# Patient Record
Sex: Female | Born: 1950 | Race: White | Hispanic: No | Smoking: Current every day smoker
Health system: Southern US, Community
[De-identification: ages and names within clinical notes are randomized; demographics above are authoritative.]

## PROBLEM LIST (undated history)

## (undated) DIAGNOSIS — K219 Gastro-esophageal reflux disease without esophagitis: Secondary | ICD-10-CM

## (undated) DIAGNOSIS — N183 Chronic kidney disease, stage 3 unspecified: Secondary | ICD-10-CM

## (undated) DIAGNOSIS — I739 Peripheral vascular disease, unspecified: Secondary | ICD-10-CM

## (undated) DIAGNOSIS — B070 Plantar wart: Secondary | ICD-10-CM

## (undated) DIAGNOSIS — R112 Nausea with vomiting, unspecified: Secondary | ICD-10-CM

## (undated) DIAGNOSIS — I714 Abdominal aortic aneurysm, without rupture: Secondary | ICD-10-CM

## (undated) DIAGNOSIS — Z9889 Other specified postprocedural states: Secondary | ICD-10-CM

## (undated) DIAGNOSIS — R0600 Dyspnea, unspecified: Secondary | ICD-10-CM

## (undated) DIAGNOSIS — E119 Type 2 diabetes mellitus without complications: Secondary | ICD-10-CM

## (undated) DIAGNOSIS — I10 Essential (primary) hypertension: Secondary | ICD-10-CM

## (undated) DIAGNOSIS — F32A Depression, unspecified: Secondary | ICD-10-CM

## (undated) DIAGNOSIS — E785 Hyperlipidemia, unspecified: Secondary | ICD-10-CM

## (undated) DIAGNOSIS — F17210 Nicotine dependence, cigarettes, uncomplicated: Secondary | ICD-10-CM

## (undated) DIAGNOSIS — N390 Urinary tract infection, site not specified: Secondary | ICD-10-CM

## (undated) DIAGNOSIS — D649 Anemia, unspecified: Secondary | ICD-10-CM

## (undated) DIAGNOSIS — R3129 Other microscopic hematuria: Secondary | ICD-10-CM

## (undated) HISTORY — PX: EYE SURGERY: SHX253

## (undated) HISTORY — DX: Type 2 diabetes mellitus without complications: E11.9

## (undated) HISTORY — DX: Nicotine dependence, cigarettes, uncomplicated: F17.210

## (undated) HISTORY — PX: COLONOSCOPY W/ POLYPECTOMY: SHX1380

## (undated) HISTORY — DX: Plantar wart: B07.0

## (undated) HISTORY — DX: Chronic kidney disease, stage 3 unspecified: N18.30

## (undated) HISTORY — DX: Other microscopic hematuria: R31.29

## (undated) HISTORY — DX: Essential (primary) hypertension: I10

## (undated) HISTORY — DX: Hyperlipidemia, unspecified: E78.5

## (undated) HISTORY — PX: TUBAL LIGATION: SHX77

---
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
## 2019-04-01 ENCOUNTER — Encounter: Payer: Self-pay | Admitting: Vascular Surgery

## 2019-04-01 ENCOUNTER — Other Ambulatory Visit: Payer: Self-pay

## 2019-04-01 ENCOUNTER — Ambulatory Visit (INDEPENDENT_AMBULATORY_CARE_PROVIDER_SITE_OTHER): Payer: Medicare Other | Admitting: Vascular Surgery

## 2019-04-01 VITALS — BP 189/104 | HR 87 | Temp 98.2°F | Resp 16 | Ht 63.0 in | Wt 152.0 lb

## 2019-04-01 DIAGNOSIS — I714 Abdominal aortic aneurysm, without rupture, unspecified: Secondary | ICD-10-CM

## 2019-04-01 NOTE — Progress Notes (Signed)
REASON FOR CONSULT:    Abdominal aortic aneurysm.  The consult is requested by Dr. Gala Lewandowsky.   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  These involve the pararenal aorta also.  The maximum diameter of the aorta including one of the larger saccular aneurysms is 4.9 cm.  Regardless given the saccular nature of these aneurysms I think we need to follow this closely and I have ordered a CT of the chest abdomen and pelvis with 1 mm cuts in 3 months.  If these appear to be changing at all or she develops any symptoms I think she should be considered for a fenestrated repair and I will be able to review these films with Dr. Trula Slade.  If these enlarged or became symptomatic and she required repair, if she were not a candidate for fenestrated graft, she would likely require a thoracoabdominal approach for repair and would have to be referred to [facility].  HYPERTENSION: The patient's initial blood pressure today was elevated. We repeated this and this was still elevated. We have encouraged the patient to follow up with their primary care physician for management of their blood pressure.  I have discussed with her the importance of tobacco cessation in detail. (> 3 min).  She understands that continued tobacco use and poor blood pressure control do increase her risk of aneurysm expansion and rupture.  Deitra Mayo, MD, FACS [facility] [phone] Office: [phone]   HPI:   Kathryn Hancock is a pleasant 68 y.o. female, who was having difficulty urinating which prompted a CT urogram.  This showed cholelithiasis but no evidence of ureterolithiasis.  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  The maximum diameter was 4.9 cm.  She denies abdominal pain or back pain.  There is no  family history of aneurysmal disease.    History reviewed. No pertinent past medical history.  History reviewed. No pertinent family history.  SOCIAL HISTORY: Social History   Socioeconomic History  . Marital status: Widowed    Spouse name: Not on file  . Number of children: Not on file  . Years of education: Not on file  . Highest education level: Not on file  Occupational History  . Not on file  Social Needs  . Financial resource strain: Not on file  . Food insecurity    Worry: Not on file    Inability: Not on file  . Transportation needs    Medical: Not on file    Non-medical: Not on file  Tobacco Use  . Smoking status: Current Every Day Smoker    Packs/day: 2.00    Types: Cigarettes  . Smokeless tobacco: Never Used  Substance and Sexual Activity  . Alcohol use: Not Currently  . Drug use: Not on file  . Sexual activity: Not on file  Lifestyle  . Physical activity    Days per week: Not on file    Minutes per session: Not on file  . Stress: Not on file  Relationships  . Social Herbalist on phone: Not on file    Gets together: Not on file    Attends religious service: Not on file    Active member of club or organization: Not on file    Attends meetings of clubs or organizations: Not on  file    Relationship status: Not on file  . Intimate partner violence    Fear of current or ex partner: Not on file    Emotionally abused: Not on file    Physically abused: Not on file    Forced sexual activity: Not on file  Other Topics Concern  . Not on file  Social History Narrative  . Not on file    Allergies  Allergen Reactions  . Penicillins     Current Outpatient Medications  Medication Sig Dispense Refill  . metFORMIN (GLUCOPHAGE) 500 MG tablet Take by mouth 2 (two) times daily with a meal.     No current facility-administered medications for this visit.     REVIEW OF SYSTEMS:  [X]  denotes positive finding, [ ]  denotes negative finding Cardiac   Comments:  Chest pain or chest pressure:    Shortness of breath upon exertion:    Short of breath when lying flat:    Irregular heart rhythm:        Vascular    Pain in calf, thigh, or hip brought on by ambulation:    Pain in feet at night that wakes you up from your sleep:     Blood clot in your veins:    Leg swelling:         Pulmonary    Oxygen at home:    Productive cough:     Wheezing:         Neurologic    Sudden weakness in arms or legs:     Sudden numbness in arms or legs:     Sudden onset of difficulty speaking or slurred speech:    Temporary loss of vision in one eye:     Problems with dizziness:         Gastrointestinal    Blood in stool:     Vomited blood:         Genitourinary    Burning when urinating:     Blood in urine:        Psychiatric    Major depression:         Hematologic    Bleeding problems:    Problems with blood clotting too easily:        Skin    Rashes or ulcers:        Constitutional    Fever or chills:     PHYSICAL EXAM:   Vitals:   04/01/19 1247  BP: (!) 189/104  Pulse: 87  Resp: 16  Temp: 98.2 F (36.8 C)  TempSrc: Oral  SpO2: 96%  Weight: 152 lb (68.9 kg)  Height: 5\' 3"  (1.6 m)    GENERAL: The patient is a well-nourished female, in no acute distress. The vital signs are documented above. CARDIAC: There is a regular rate and rhythm.  VASCULAR: I do not detect carotid bruits. She has palpable femoral pulses. I cannot palpate pedal pulses. She has no significant lower extremity swelling. PULMONARY: There is good air exchange bilaterally without wheezing or rales. ABDOMEN: Soft and non-tender with normal pitched bowel sounds.  Her aneurysm is nontender. MUSCULOSKELETAL: There are no major deformities or cyanosis. NEUROLOGIC: No focal weakness or paresthesias are detected. SKIN: There are no ulcers or rashes noted. PSYCHIATRIC: The patient has a normal affect.  DATA:    CT UROGRAM: I was able to retrieve her CT  urogram that was done at [facility].  This shows a moderate sized fusiform aneurysm with the largest diameter of 4.9  cm.  There are multiple saccular components to this including 2 that are pararenal.

## 2019-06-10 ENCOUNTER — Other Ambulatory Visit: Payer: Self-pay

## 2019-06-10 DIAGNOSIS — I714 Abdominal aortic aneurysm, without rupture, unspecified: Secondary | ICD-10-CM

## 2019-06-12 DIAGNOSIS — I714 Abdominal aortic aneurysm, without rupture, unspecified: Secondary | ICD-10-CM

## 2019-06-12 DIAGNOSIS — I2510 Atherosclerotic heart disease of native coronary artery without angina pectoris: Secondary | ICD-10-CM

## 2019-06-12 DIAGNOSIS — D151 Benign neoplasm of heart: Secondary | ICD-10-CM

## 2019-06-12 HISTORY — DX: Atherosclerotic heart disease of native coronary artery without angina pectoris: I25.10

## 2019-06-12 HISTORY — DX: Abdominal aortic aneurysm, without rupture: I71.4

## 2019-06-12 HISTORY — DX: Abdominal aortic aneurysm, without rupture, unspecified: I71.40

## 2019-06-12 HISTORY — DX: Benign neoplasm of heart: D15.1

## 2019-06-12 HISTORY — PX: OTHER SURGICAL HISTORY: SHX169

## 2019-06-26 ENCOUNTER — Other Ambulatory Visit: Payer: Self-pay

## 2019-06-26 ENCOUNTER — Ambulatory Visit
Admission: RE | Admit: 2019-06-26 | Discharge: 2019-06-26 | Disposition: A | Discharge status: Home / Self Care | Payer: Medicare Other | Source: Ambulatory Visit | Attending: Vascular Surgery | Admitting: Vascular Surgery

## 2019-06-26 DIAGNOSIS — I714 Abdominal aortic aneurysm, without rupture, unspecified: Secondary | ICD-10-CM

## 2019-06-26 MED ORDER — IOPAMIDOL (ISOVUE-370) INJECTION 76%
75.0000 mL | Freq: Once | INTRAVENOUS | Status: AC | PRN
  Administered 2019-06-26: 60 mL via INTRAVENOUS

## 2019-06-30 ENCOUNTER — Telehealth (HOSPITAL_COMMUNITY): Payer: Self-pay

## 2019-06-30 NOTE — Telephone Encounter (Signed)

## 2019-07-01 ENCOUNTER — Ambulatory Visit (INDEPENDENT_AMBULATORY_CARE_PROVIDER_SITE_OTHER): Payer: Medicare Other | Admitting: Vascular Surgery

## 2019-07-01 ENCOUNTER — Other Ambulatory Visit: Payer: Self-pay

## 2019-07-01 ENCOUNTER — Encounter: Payer: Self-pay | Admitting: Vascular Surgery

## 2019-07-01 VITALS — BP 148/78 | HR 85 | Temp 97.3°F | Resp 20 | Ht 63.0 in | Wt 146.0 lb

## 2019-07-01 DIAGNOSIS — I714 Abdominal aortic aneurysm, without rupture, unspecified: Secondary | ICD-10-CM

## 2019-07-01 DIAGNOSIS — I5189 Other ill-defined heart diseases: Secondary | ICD-10-CM

## 2019-07-01 NOTE — Progress Notes (Signed)
Patient name: Kathryn Hancock MRN: [id number] DOB: 1951/03/06 Sex: female  REASON FOR VISIT:   Follow-up of abdominal aortic aneurysm.  HPI:   Kathryn Hancock is a pleasant 69 y.o. female who I saw on 04/01/2019.  She was having difficulty urinating which prompted a CT urogram.  An incidental finding was a saccular aneurysm of the infrarenal aorta which measured 4.9 cm in maximum diameter.  On the CT urogram there was a moderate sized fusiform aneurysm of the infrarenal aorta measuring 4.9 cm in maximum diameter.  There were multiple saccular components to this including 2 that were pararenal.  I ordered a CT of the chest abdomen pelvis with thin cuts in 3 months and she comes in to discuss these results.  I felt that if these progressed or she develops symptoms she would have to be considered for a fenestrated repair or referred to [facility] for a thoracoabdominal repair.  We did discuss the importance of tobacco cessation at that visit.  Since I saw the patient last she denies any new onset abdominal pain or back pain.  She is scheduled for a colonoscopy as part of her routine screening as she has a history of polyps.  She does describe some mild bilateral calf claudication.  I do not get any history of rest pain or nonhealing ulcers.  She does continue to smoke.  Past Medical History:  Diagnosis Date  . AAA (abdominal aortic aneurysm) ([place])   . Acute urinary tract infection   . Diabetes mellitus without complication (HCC)    Type 2  . Hyperlipidemia    Mixed  . Hypertension   . Microscopic hematuria   . Plantar wart of right foot     No family history on file.  SOCIAL HISTORY: Social History   Tobacco Use  . Smoking status: Current Every Day Smoker    Packs/day: 2.00    Types: Cigarettes  . Smokeless tobacco: Never Used  Substance Use Topics  . Alcohol use: Not Currently    Allergies  Allergen Reactions  . Penicillins     Current Outpatient Medications  Medication Sig  Dispense Refill  . Ascorbic Acid (VITAMIN C) 1000 MG tablet Take 1,000 mg by mouth daily.    Marland Kitchen aspirin EC 81 MG tablet Take 81 mg by mouth daily.    Marland Kitchen atorvastatin (LIPITOR) 20 MG tablet Take 20 mg by mouth daily.    . Cholecalciferol (D3-1000) 25 MCG (1000 UT) tablet Take 1,000 Units by mouth daily.    Marland Kitchen dicyclomine (BENTYL) 10 MG capsule Take 10 mg by mouth 4 (four) times daily -  before meals and at bedtime.    . empagliflozin (JARDIANCE) 25 MG TABS tablet Take 25 mg by mouth daily.    . Exenatide ER (BYDUREON BCISE) 2 MG/0.85ML AUIJ Inject into the skin. Inject 2 mg every week with meals.    . imiquimod (ALDARA) 5 % cream Apply topically 3 (three) times a week.    . losartan (COZAAR) 100 MG tablet Take 100 mg by mouth daily.    . metFORMIN (GLUCOPHAGE) 1000 MG tablet Take 1,000 mg by mouth 2 (two) times daily with a meal.    . omeprazole (PRILOSEC) 20 MG capsule Take 20 mg by mouth daily.     No current facility-administered medications for this visit.    REVIEW OF SYSTEMS:  [X]  denotes positive finding, [ ]  denotes negative finding Cardiac  Comments:  Chest pain or chest pressure:    Shortness of  breath upon exertion:    Short of breath when lying flat:    Irregular heart rhythm:        Vascular    Pain in calf, thigh, or hip brought on by ambulation:    Pain in feet at night that wakes you up from your sleep:     Blood clot in your veins:    Leg swelling:         Pulmonary    Oxygen at home:    Productive cough:     Wheezing:         Neurologic    Sudden weakness in arms or legs:     Sudden numbness in arms or legs:     Sudden onset of difficulty speaking or slurred speech:    Temporary loss of vision in one eye:     Problems with dizziness:         Gastrointestinal    Blood in stool:     Vomited blood:         Genitourinary    Burning when urinating:     Blood in urine:        Psychiatric    Major depression:         Hematologic    Bleeding problems:      Problems with blood clotting too easily:        Skin    Rashes or ulcers:        Constitutional    Fever or chills:     PHYSICAL EXAM:   Vitals:   07/01/19 1249  BP: (!) 148/78  Pulse: 85  Resp: 20  Temp: (!) 97.3 F (36.3 C)  SpO2: 99%  Weight: 146 lb (66.2 kg)  Height: 5\' 3"  (1.6 m)    GENERAL: The patient is a well-nourished female, in no acute distress. The vital signs are documented above. CARDIAC: There is a regular rate and rhythm.  VASCULAR: I do not detect carotid bruits. She has normal femoral pulses. I do not palpate pedal pulses.  Both feet however are warm and well-perfused. She has no significant lower extremity swelling. PULMONARY: There is good air exchange bilaterally without wheezing or rales. ABDOMEN: Soft and non-tender with normal pitched bowel sounds.  Her aneurysm is nontender. MUSCULOSKELETAL: There are no major deformities or cyanosis. NEUROLOGIC: No focal weakness or paresthesias are detected. SKIN: There are no ulcers or rashes noted. PSYCHIATRIC: The patient has a normal affect.  DATA:    CT CHEST ABDOMEN PELVIS: I have reviewed the images of the CT of the chest abdomen pelvis that was done on 06/26/2019.  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  The aneurysms are unchanged in size.  Of note there was a large filling defect of the left atrium.  This was unchanged from previous CT scan and was felt to potentially be related to previous procedures involving the left atrium versus thrombus or mass.  MEDICAL ISSUES:   PARARENAL ABDOMINAL AORTIC ANEURYSM: This patient has an unusual pararenal abdominal aortic aneurysm.  The maximum diameter is 4.9 cm distally.  There is some saccular components to this aneurysm at the level of the renals and therefore if this enlarge significantly she would have to be considered for a fenestrated graft versus a thoracoabdominal repair.  I explained  that if she did require thoracoabdominal repair we would refer her to [facility].  I have explained that if she  would prefer to have this followed there that would be perfectly reasonable.  However she feels comfortable having [place] follow it and only considering referral if this enlarges significantly.  I have ordered a CT angiogram of the abdomen and pelvis in 9 months and I will see her back at that time.  We have again discussed the importance of tobacco cessation.  LEFT ATRIAL FILLING DEFECT: Her CT scan shows a large left atrial filling defect and we will schedule a 2D echo to further evaluate this.  She will likely need cardiac referral pending these results.  She would like to have this done in [place] closer to [place].  CLAUDICATION: The patient does describe bilateral lower extremity claudication.  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  I have ordered ABIs when she returns in 9 months to have as a baseline.    Deitra Mayo [facility] [phone]

## 2019-07-02 ENCOUNTER — Other Ambulatory Visit: Payer: Self-pay

## 2019-07-02 DIAGNOSIS — I5189 Other ill-defined heart diseases: Secondary | ICD-10-CM

## 2019-07-02 DIAGNOSIS — I714 Abdominal aortic aneurysm, without rupture, unspecified: Secondary | ICD-10-CM

## 2019-07-09 ENCOUNTER — Other Ambulatory Visit (HOSPITAL_COMMUNITY): Payer: Medicare Other

## 2019-07-13 HISTORY — PX: TRANSTHORACIC ECHOCARDIOGRAM: SHX275

## 2019-07-14 ENCOUNTER — Ambulatory Visit (HOSPITAL_COMMUNITY): Admission: RE | Admit: 2019-07-14 | Payer: Medicare Other | Source: Ambulatory Visit

## 2019-07-24 ENCOUNTER — Ambulatory Visit (HOSPITAL_COMMUNITY)
Admission: RE | Admit: 2019-07-24 | Discharge: 2019-07-24 | Disposition: A | Discharge status: Home / Self Care | Payer: Medicare Other | Source: Ambulatory Visit | Attending: Vascular Surgery | Admitting: Vascular Surgery

## 2019-07-24 ENCOUNTER — Other Ambulatory Visit: Payer: Self-pay

## 2019-07-24 DIAGNOSIS — I358 Other nonrheumatic aortic valve disorders: Secondary | ICD-10-CM | POA: Insufficient documentation

## 2019-07-24 DIAGNOSIS — F17200 Nicotine dependence, unspecified, uncomplicated: Secondary | ICD-10-CM | POA: Insufficient documentation

## 2019-07-24 DIAGNOSIS — E119 Type 2 diabetes mellitus without complications: Secondary | ICD-10-CM | POA: Insufficient documentation

## 2019-07-24 DIAGNOSIS — I714 Abdominal aortic aneurysm, without rupture, unspecified: Secondary | ICD-10-CM

## 2019-07-24 DIAGNOSIS — E785 Hyperlipidemia, unspecified: Secondary | ICD-10-CM | POA: Insufficient documentation

## 2019-07-24 DIAGNOSIS — I5189 Other ill-defined heart diseases: Secondary | ICD-10-CM | POA: Diagnosis not present

## 2019-07-24 DIAGNOSIS — I119 Hypertensive heart disease without heart failure: Secondary | ICD-10-CM | POA: Insufficient documentation

## 2019-07-24 NOTE — Progress Notes (Signed)
  Echocardiogram 2D Echocardiogram has been performed.  Kathryn Hancock 07/24/2019, 11:50 AM

## 2019-12-07 ENCOUNTER — Telehealth: Payer: Self-pay | Admitting: Cardiology

## 2019-12-07 NOTE — Telephone Encounter (Signed)
Returned call to patient, advised ok for daughter to attend.

## 2019-12-07 NOTE — Telephone Encounter (Signed)
New Message  Pt is calling and says she gets confused a lot and she would like for her daughter to assist her to her appt  She says they are both fully vaccinated   Please advise

## 2019-12-10 ENCOUNTER — Ambulatory Visit (INDEPENDENT_AMBULATORY_CARE_PROVIDER_SITE_OTHER): Payer: Medicare Other | Admitting: Cardiology

## 2019-12-10 ENCOUNTER — Encounter: Payer: Self-pay | Admitting: Cardiology

## 2019-12-10 ENCOUNTER — Other Ambulatory Visit: Payer: Self-pay

## 2019-12-10 VITALS — BP 134/82 | HR 85 | Temp 97.2°F | Ht 64.0 in | Wt 147.8 lb

## 2019-12-10 DIAGNOSIS — D151 Benign neoplasm of heart: Secondary | ICD-10-CM | POA: Insufficient documentation

## 2019-12-10 DIAGNOSIS — I5189 Other ill-defined heart diseases: Secondary | ICD-10-CM

## 2019-12-10 DIAGNOSIS — I714 Abdominal aortic aneurysm, without rupture, unspecified: Secondary | ICD-10-CM

## 2019-12-10 DIAGNOSIS — I2510 Atherosclerotic heart disease of native coronary artery without angina pectoris: Secondary | ICD-10-CM | POA: Diagnosis not present

## 2019-12-10 DIAGNOSIS — Z720 Tobacco use: Secondary | ICD-10-CM

## 2019-12-10 DIAGNOSIS — I25119 Atherosclerotic heart disease of native coronary artery with unspecified angina pectoris: Secondary | ICD-10-CM | POA: Insufficient documentation

## 2019-12-10 MED ORDER — APIXABAN 5 MG PO TABS: 5.0000 mg | ORAL_TABLET | Freq: Two times a day (BID) | ORAL | 6 refills | Status: AC

## 2019-12-10 MED ORDER — APIXABAN 5 MG PO TABS: 5.0000 mg | ORAL_TABLET | Freq: Two times a day (BID) | ORAL | 1 refills | Status: DC

## 2019-12-10 NOTE — Progress Notes (Signed)
Primary Care Provider: Gala Lewandowsky, MD Cardiologist: Glenetta Hew, MD Electrophysiologist: None  Vascular Sgx: Dr. Scot Dock  Clinic Note: Chief Complaint  Patient presents with  . New Patient (Initial Visit)    Left atrial mass    HPI:    Kathryn Hancock is a 69 y.o. female with no prior cardiac history with long-term smoking, hyperlipidemia, hypertension and diabetes with documented abdominal aortic aneurysm below who presents today for LEFT ATRIAL MASS seen on CT scan and echocardiogram, at the request of Sistasis, Sylvester Harder, MD.  Kathryn Hancock was last seen on May 6 by her PCP in follow-up from recent studies.  Recent Hospitalizations: None  Reviewed  CV studies:    The following studies were reviewed today: (if available, images/films reviewed: From Epic Chart or Care Everywh she was 60 ere) . July 24, 2019, 2D ECHO:: EF 60 to 65%.  GR 1 DD.  Elevated LVEDP.  Large size ill-defined echodensity in the left atrium suspicious for thrombus versus tumor.  Moderately dilated LA. . 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  Distal abdominal aortic aneurysm 4.5 x 4.7 cm.  Greatest AP dimension of the infrarenal aorta is 4.9.  No evidence of thoracic aortic aneurysm or dissection.  Brief segment of proximal IMA occlusion.  Large geographic filling defect of the left atrium with associated dense radiopaque material.  Aortic atherosclerosis and emphysema.  Atherosclerotic coronary disease.  Interval History:   Kathryn Hancock is here really without any major cardiac symptoms.  She is here because of findings on her CT scan and echo.  She says that she may notice occasional palpitations here and there but nothing significant.  About 2 to 3 years ago she had a long episode of chest pain last about an hour but otherwise has not had any chest pain or pressure.  She has baseline exertional dyspnea but attributes that to her smoking.  Has morning  cough.  No other heart failure symptoms.  No edema.  No TIA or amaurosis fugax symptoms.  CV Review of Symptoms (Summary) Cardiovascular ROS: no chest pain or dyspnea on exertion positive for - palpitations and shortness of breath negative for - edema, irregular heartbeat, orthopnea, paroxysmal nocturnal dyspnea, rapid heart rate or Syncope/near syncope, TIA/amaurosis fugax, claudication  The patient does not have symptoms concerning for COVID-19 infection (fever, chills, cough, or new shortness of breath).  The patient is practicing social distancing & Masking.    REVIEWED OF SYSTEMS   Review of Systems  Constitutional: Negative for malaise/fatigue and weight loss.  HENT: Positive for congestion (Occasionally in the morning). Negative for nosebleeds.   Respiratory: Positive for cough (Morning cough) and shortness of breath (Baseline). Negative for sputum production.   Gastrointestinal: Negative for blood in stool and melena.  Genitourinary: Negative for hematuria.  Musculoskeletal: Negative for joint pain.  Neurological: Negative for dizziness and focal weakness.  Psychiatric/Behavioral: Positive for depression. Negative for memory loss and suicidal ideas. The patient is nervous/anxious (Has had longstanding issues with intermittent anxiety). The patient does not have insomnia.        Still grieving her son's death.  He had an overdose in 10-Oct-2022 and died of complications.  This is probably reason why she missed her initial cardiology appointment.     I have reviewed and (if needed) personally updated the patient's problem list, medications, allergies, past medical and surgical history, social and family history.   PAST MEDICAL HISTORY   Past Medical History:  Diagnosis  Date  . 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  Distal abdominal aortic aneurysm 4.5 x 4.7 cm.  Greatest AP dimension of the infrarenal  aorta is 4.9.  No evidence of thoracic aortic aneurysm or dissection.  Brief segment of proximal IMA occlusion.  . CKD (chronic kidney disease) stage 3a, GFR 30-59 ml/min   . Coronary artery calcification seen on CAT scan 06/2019  . Diabetes mellitus without complication (HCC)    Type 2  . Heavy smoker (more than 20 cigarettes per day)    ~ 2 ppd; since age 45 (66 pk yr)  . Hyperlipidemia    Mixed  . Hypertension   . 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 -> TTE 07/24/19: Large size ill-defined echodensity in the left atrium suspicious for thrombus versus tumor.  Moderately dilated LA.  . Microscopic hematuria   . Plantar wart of right foot     PAST SURGICAL HISTORY   Past Surgical History:  Procedure Laterality Date  . COLONOSCOPY W/ POLYPECTOMY    . EYE SURGERY Bilateral    Cataract surgery  . TRANSTHORACIC ECHOCARDIOGRAM  07/2019    EF 60 to 65%.  GR 1 DD.  Elevated LVEDP.  Large size ill-defined echodensity in the left atrium suspicious for thrombus versus tumor.  Moderately dilated LA.  . TUBAL LIGATION      MEDICATIONS/ALLERGIES   Current Meds  Medication Sig  . Ascorbic Acid (VITAMIN C) 1000 MG tablet Take 1,000 mg by mouth daily.  Marland Kitchen atorvastatin (LIPITOR) 20 MG tablet Take 20 mg by mouth daily.  . Cholecalciferol (D3-1000) 25 MCG (1000 UT) tablet Take 1,000 Units by mouth daily.  Marland Kitchen dicyclomine (BENTYL) 10 MG capsule Take 10 mg by mouth 4 (four) times daily -  before meals and at bedtime.  . empagliflozin (JARDIANCE) 25 MG TABS tablet Take 25 mg by mouth daily.  . Exenatide ER (BYDUREON BCISE) 2 MG/0.85ML AUIJ Inject into the skin. Inject 2 mg every week with meals.  Marland Kitchen glucose blood test strip 1 each by Other route as needed. Use as instructed  . imiquimod (ALDARA) 5 % cream Apply topically 3 (three) times a week.  . losartan (COZAAR) 100 MG tablet Take 100 mg by mouth  daily.  . metFORMIN (GLUCOPHAGE) 1000 MG tablet Take 1,000 mg by mouth 2 (two) times daily with a meal.  . montelukast (SINGULAIR) 10 MG tablet Take 10 mg by mouth at bedtime.  Marland Kitchen omeprazole (PRILOSEC) 20 MG capsule Take 20 mg by mouth daily.  . [DISCONTINUED] aspirin EC 81 MG tablet Take 81 mg by mouth daily.    Allergies  Allergen Reactions  . Penicillins     SOCIAL HISTORY/FAMILY HISTORY   Social History   Tobacco Use  . Smoking status: Current Every Day Smoker    Packs/day: 2.00    Years: 50.00    Pack years: 100.00    Types: Cigarettes  . Smokeless tobacco: Never Used  Vaping Use  . Vaping Use: Never used  Substance Use Topics  . Alcohol use: Not Currently  . Drug use: Never   Social History   Social History Narrative   She is a mother of 2-but her son died in 10/05/19 of a drug overdose.   Denies alcohol use.   Family History  Problem Relation Age of Onset  . Diabetes Mother   .  Diabetes Father   . Heart attack Father 7  . CAD Father   . Hyperlipidemia Father   . Hypertension Father     OBJCTIVE -PE, EKG, labs   Wt Readings from Last 3 Encounters:  12/10/19 147 lb 12.8 oz (67 kg)  07/01/19 146 lb (66.2 kg)  04/01/19 152 lb (68.9 kg)    Physical Exam: BP 134/82   Pulse 85   Temp (!) 97.2 F (36.2 C)   Ht 5\' 4"  (1.626 m)   Wt 147 lb 12.8 oz (67 kg)   SpO2 97%   BMI 25.37 kg/m  Physical Exam Constitutional:      General: She is not in acute distress.    Appearance: Normal appearance. She is normal weight. She is not ill-appearing.     Comments: Well-groomed.  Appears older than stated age.  HENT:     Head: Normocephalic and atraumatic.  Eyes:     Extraocular Movements: Extraocular movements intact.     Pupils: Pupils are equal, round, and reactive to light.  Cardiovascular:     Rate and Rhythm: Normal rate and regular rhythm.     Pulses: Normal pulses.     Heart sounds: Normal heart sounds. No murmur heard.  No friction rub. No gallop.       Comments: Nondisplaced PMI Pulmonary:     Effort: Pulmonary effort is normal.     Breath sounds: Normal breath sounds.     Comments: Slightly diminished breath sounds but no obvious wheezes rales or rhonchi Chest:     Chest wall: No tenderness.  Abdominal:     General: Abdomen is flat. Bowel sounds are normal. There is no distension.     Palpations: Abdomen is soft.     Tenderness: There is no abdominal tenderness.     Comments: No HSM  Musculoskeletal:        General: No swelling. Normal range of motion.  Neurological:     General: No focal deficit present.     Mental Status: She is alert and oriented to person, place, and time.  Psychiatric:        Mood and Affect: Mood normal.        Behavior: Behavior normal.        Thought Content: Thought content normal.        Judgment: Judgment normal.     Adult ECG Report  Rate: 85 ;  Rhythm: normal sinus rhythm and Normal axis, intervals and durations.;   Narrative Interpretation: Normal  Recent Labs:  10/14/2019  Na+ 146, K+ 4.8, Cl- 107, HCO3- 14 , BUN 19, Cr 1.38 (GFR ~40), Glu 117, Ca2+ 9.6; AST 18, ALT 16, AlkP 132  CBC: W 12.4, H/H 14.0/42.0%, Plt 244  No results found for: CHOL, HDL, LDLCALC, LDLDIRECT, TRIG, CHOLHDL No results found for: CREATININE, BUN, NA, K, CL, CO2 No results found for: TSH  ASSESSMENT/PLAN    Problem List Items Addressed This Visit    Tobacco abuse (Chronic)    Smoking cessation instruction/counseling given:  counseled patient on the dangers of tobacco use, advised patient to stop smoking, and reviewed strategies to maximize success      Coronary artery calcification seen on CAT scan (Chronic)    No active anginal symptoms.  She does have diagnoses of hypertension, hyperlipidemia and diabetes along with longstanding smoking. -> Prior to any likely cardiac surgery, would need preoperative cardiac catheterization to exclude exclusive disease.  She is already on aspirin statin and ARB. On  Jardiance.      Relevant Medications   apixaban (ELIQUIS) 5 MG TABS tablet   apixaban (ELIQUIS) 5 MG TABS tablet   Other Relevant Orders   EKG 12-Lead (Completed)   MR CARDIAC MORPHOLOGY W WO CONTRAST   Basic metabolic panel   Ambulatory referral to Cardiovascular Surgery   AAA (abdominal aortic aneurysm) without rupture (HCC) (Chronic)    First evaluated by Dr. Gae Gallop -> will likely follow-up with intermittent CTA chest abdomen pelvis evaluations.  Currently not at the level to do anything yet.  Continue blood pressure, lipid and diabetes management.  Smoking cessation counseling      Relevant Medications   apixaban (ELIQUIS) 5 MG TABS tablet   apixaban (ELIQUIS) 5 MG TABS tablet   Other Relevant Orders   EKG 12-Lead (Completed)   MR CARDIAC MORPHOLOGY W WO CONTRAST   Basic metabolic panel   Ambulatory referral to Cardiovascular Surgery   Left atrial mass - Primary    Very unusual finding.  I am not sure that I would consider this left atrial thrombus which was described in the echo.  Need to better more distinctly imaging.  I discussed this with Dr. Eleonore Chiquito reviewing both the CT scan and echocardiogram images.  He feels that dedicated cardiac MRI would be the most informative study in order to distinguish true features of this mass.  With the concern for thrombus, we will start her on Eliquis 5 mg twice daily.  However, I do not truthfully feel that it is a mass.  Plan:  Check cardiac MRI  Start Eliquis 5 mg twice daily with concern for initial description as thrombus but also for possible thromboembolic source with this left atrial mass.  Following MRI, will refer to CVTS -> suspected preop will need cardiac catheterization -> we will hold off on scheduling until seen by CVTS in order to determine if we need to do right and left or simply left heart cath.      Relevant Orders   EKG 12-Lead (Completed)   MR CARDIAC MORPHOLOGY W WO CONTRAST   Basic metabolic  panel   Ambulatory referral to Cardiovascular Surgery       COVID-19 Education: The signs and symptoms of COVID-19 were discussed with the patient and how to seek care for testing (follow up with PCP or arrange E-visit).   The importance of social distancing and COVID-19 vaccination was discussed today.  I spent a total of 22 minutes with the patient. >  50% of the time was spent in direct patient consultation.  Additional time spent with chart review  / charting (studies, outside notes, etc): 10 - plus 10 min discussion with Imaging Cardiologist to review Echo.  Total Time: 32 min   Current medicines are reviewed at length with the patient today.  (+/- concerns) n/a  Notice: This dictation was prepared with Dragon dictation along with smaller phrase technology. Any transcriptional errors that result from this process are unintentional and may not be corrected upon review.  Patient Instructions / Medication Changes & Studies & Tests Ordered   Patient Instructions  Medication Instructions:  Stop taking Aspirin   Start taking Eliquis 5 mg one tablet  Twice a day  *If you need a refill on your cardiac medications before your next appointment, please call your pharmacy*   Lab Work: [place] [facility] If you have labs (blood work) drawn today and your tests are completely normal, you will receive your  results only by: Marland Kitchen MyChart Message (if you have MyChart) OR . A paper copy in the mail If you have any lab test that is abnormal or we need to change your treatment, we will call you to review the results.   Testing/Procedures: Will be schedule at [place] has requested that you have a cardiac MRI. Cardiac MRI uses a computer to create images of your heart as its beating, producing both still and moving pictures of your heart and major blood vessels.  Please follow the instruction sheet given to you today for more  information.    Follow-Up: At [facility], you and your health needs are our priority.  As part of our continuing mission to provide you with exceptional heart care, we have created designated Provider Care Teams.  These Care Teams include your primary Cardiologist (physician) and Advanced Practice Providers (APPs -  Physician Assistants and Nurse Practitioners) who all work together to provide you with the care you need, when you need it.  We recommend signing up for the patient portal called "MyChart".  Sign up information is provided on this After Visit Summary.  MyChart is used to connect with patients for Virtual Visits (Telemedicine).  Patients are able to view lab/test results, encounter notes, upcoming appointments, etc.  Non-urgent messages can be sent to your provider as well.   To learn more about what you can do with MyChart, go to [email or web address].    Your next appointment:   2 month(s)  The format for your next appointment:   In Person  Provider:   Glenetta Hew, MD   Other Instructions   You have been referred to CVT' - surgeon - discuss Cardiac MRI- Atrial Mass?    Studies Ordered:   Orders Placed This Encounter  Procedures  . MR CARDIAC MORPHOLOGY W WO CONTRAST  . Basic metabolic panel  . Ambulatory referral to Cardiovascular Surgery  . EKG 12-Lead     Glenetta Hew, M.D., M.S. Interventional Cardiologist   Pager # [phone] Phone # [phone] [address] [place], [place] [address]   Thank you for choosing Heartcare at [facility]!!

## 2019-12-10 NOTE — Patient Instructions (Addendum)
Medication Instructions:  Stop taking Aspirin   Start taking Eliquis 5 mg one tablet  Twice a day  *If you need a refill on your cardiac medications before your next appointment, please call your pharmacy*   Lab Work: [place] [facility] If you have labs (blood work) drawn today and your tests are completely normal, you will receive your results only by: Marland Kitchen MyChart Message (if you have MyChart) OR . A paper copy in the mail If you have any lab test that is abnormal or we need to change your treatment, we will call you to review the results.   Testing/Procedures: Will be schedule at [place] has requested that you have a cardiac MRI. Cardiac MRI uses a computer to create images of your heart as its beating, producing both still and moving pictures of your heart and major blood vessels.  Please follow the instruction sheet given to you today for more information.    Follow-Up: At [facility], you and your health needs are our priority.  As part of our continuing mission to provide you with exceptional heart care, we have created designated Provider Care Teams.  These Care Teams include your primary Cardiologist (physician) and Advanced Practice Providers (APPs -  Physician Assistants and Nurse Practitioners) who all work together to provide you with the care you need, when you need it.  We recommend signing up for the patient portal called "MyChart".  Sign up information is provided on this After Visit Summary.  MyChart is used to connect with patients for Virtual Visits (Telemedicine).  Patients are able to view lab/test results, encounter notes, upcoming appointments, etc.  Non-urgent messages can be sent to your provider as well.   To learn more about what you can do with MyChart, go to [email or web address].    Your next appointment:   2 month(s)  The format for your next appointment:   In Person  Provider:   Glenetta Hew, MD   Other Instructions   You have been referred to CVT' - surgeon - discuss Cardiac MRI- Atrial Mass?

## 2019-12-13 ENCOUNTER — Encounter: Payer: Self-pay | Admitting: Cardiology

## 2019-12-13 NOTE — Assessment & Plan Note (Signed)
Very unusual finding.  I am not sure that I would consider this left atrial thrombus which was described in the echo.  Need to better more distinctly imaging.  I discussed this with Dr. Eleonore Chiquito reviewing both the CT scan and echocardiogram images.  He feels that dedicated cardiac MRI would be the most informative study in order to distinguish true features of this mass.  With the concern for thrombus, we will start her on Eliquis 5 mg twice daily.  However, I do not truthfully feel that it is a mass.  Plan:  Check cardiac MRI  Start Eliquis 5 mg twice daily with concern for initial description as thrombus but also for possible thromboembolic source with this left atrial mass.  Following MRI, will refer to CVTS -> suspected preop will need cardiac catheterization -> we will hold off on scheduling until seen by CVTS in order to determine if we need to do right and left or simply left heart cath.

## 2019-12-13 NOTE — Assessment & Plan Note (Signed)
First evaluated by Dr. Gae Gallop -> will likely follow-up with intermittent CTA chest abdomen pelvis evaluations.  Currently not at the level to do anything yet.  Continue blood pressure, lipid and diabetes management.  Smoking cessation counseling

## 2019-12-13 NOTE — Assessment & Plan Note (Signed)
Smoking cessation instruction/counseling given:  counseled patient on the dangers of tobacco use, advised patient to stop smoking, and reviewed strategies to maximize success 

## 2019-12-13 NOTE — Assessment & Plan Note (Signed)
No active anginal symptoms.  She does have diagnoses of hypertension, hyperlipidemia and diabetes along with longstanding smoking. -> Prior to any likely cardiac surgery, would need preoperative cardiac catheterization to exclude exclusive disease.  She is already on aspirin statin and ARB. On Jardiance.

## 2019-12-16 ENCOUNTER — Encounter: Payer: Self-pay | Admitting: *Deleted

## 2019-12-16 ENCOUNTER — Telehealth: Payer: Self-pay | Admitting: *Deleted

## 2019-12-16 NOTE — Telephone Encounter (Signed)
Spoke with patient's daughter (patient asked that I speak with her daughter) regarding appointment for Cardiac MRI scheduled Friday 01/22/20 at 8:00 am at [facility]----arrival time is 7:30 am 1st floor admissions office.  Patient is coming in 1 week prior for lab work. Will mail information to patient.

## 2020-01-15 LAB — BASIC METABOLIC PANEL
BUN/Creatinine Ratio: 12 (ref 12–28)
BUN: 15 mg/dL (ref 8–27)
CO2: 20 mmol/L (ref 20–29)
Calcium: 10.2 mg/dL (ref 8.7–10.3)
Chloride: 102 mmol/L (ref 96–106)
Creatinine, Ser: 1.26 mg/dL — ABNORMAL HIGH (ref 0.57–1.00)
GFR calc Af Amer: 51 mL/min/{1.73_m2} — ABNORMAL LOW (ref >59)
GFR calc non Af Amer: 44 mL/min/{1.73_m2} — ABNORMAL LOW (ref >59)
Glucose: 130 mg/dL — ABNORMAL HIGH (ref 65–99)
Potassium: 4.7 mmol/L (ref 3.5–5.2)
Sodium: 139 mmol/L (ref 134–144)

## 2020-01-20 ENCOUNTER — Telehealth (HOSPITAL_COMMUNITY): Payer: Self-pay | Admitting: Emergency Medicine

## 2020-01-20 NOTE — Telephone Encounter (Signed)
Reaching out to patient to offer assistance regarding upcoming cardiac imaging study; pt verbalizes understanding of appt date/time, parking situation and where to check in, pre-test NPO status and medications ordered, and verified current allergies; name and call back number provided for further questions should they arise Particia Strahm RN Navigator Cardiac Imaging [facility] [phone] office [phone] cell 

## 2020-01-22 ENCOUNTER — Other Ambulatory Visit: Payer: Self-pay

## 2020-01-22 ENCOUNTER — Ambulatory Visit (HOSPITAL_COMMUNITY)
Admission: RE | Admit: 2020-01-22 | Discharge: 2020-01-22 | Disposition: A | Discharge status: Home / Self Care | Payer: Medicare Other | Source: Ambulatory Visit | Attending: Cardiology | Admitting: Cardiology

## 2020-01-22 DIAGNOSIS — I5189 Other ill-defined heart diseases: Secondary | ICD-10-CM | POA: Insufficient documentation

## 2020-01-22 DIAGNOSIS — I714 Abdominal aortic aneurysm, without rupture, unspecified: Secondary | ICD-10-CM

## 2020-01-22 DIAGNOSIS — I2510 Atherosclerotic heart disease of native coronary artery without angina pectoris: Secondary | ICD-10-CM | POA: Diagnosis present

## 2020-01-22 HISTORY — PX: OTHER SURGICAL HISTORY: SHX169

## 2020-01-22 MED ORDER — GADOBUTROL 1 MMOL/ML IV SOLN
7.0000 mL | Freq: Once | INTRAVENOUS | Status: AC | PRN
  Administered 2020-01-22: 7 mL via INTRAVENOUS

## 2020-02-12 ENCOUNTER — Ambulatory Visit (INDEPENDENT_AMBULATORY_CARE_PROVIDER_SITE_OTHER): Payer: Medicare Other | Admitting: Cardiology

## 2020-02-12 ENCOUNTER — Encounter: Payer: Self-pay | Admitting: Cardiology

## 2020-02-12 ENCOUNTER — Other Ambulatory Visit: Payer: Self-pay

## 2020-02-12 VITALS — BP 160/92 | HR 91 | Ht 63.0 in | Wt 145.6 lb

## 2020-02-12 DIAGNOSIS — I2510 Atherosclerotic heart disease of native coronary artery without angina pectoris: Secondary | ICD-10-CM

## 2020-02-12 DIAGNOSIS — I714 Abdominal aortic aneurysm, without rupture, unspecified: Secondary | ICD-10-CM

## 2020-02-12 DIAGNOSIS — Z720 Tobacco use: Secondary | ICD-10-CM

## 2020-02-12 DIAGNOSIS — D151 Benign neoplasm of heart: Secondary | ICD-10-CM | POA: Diagnosis not present

## 2020-02-12 NOTE — Patient Instructions (Signed)
Medication Instructions:  Continue same medications *If you need a refill on your cardiac medications before your next appointment, please call your pharmacy*   Lab Work: None ordered   Testing/Procedures: None ordered   Follow-Up: At [facility], you and your health needs are our priority.  As part of our continuing mission to provide you with exceptional heart care, we have created designated Provider Care Teams.  These Care Teams include your primary Cardiologist (physician) and Advanced Practice Providers (APPs -  Physician Assistants and Nurse Practitioners) who all work together to provide you with the care you need, when you need it.  We recommend signing up for the patient portal called "MyChart".  Sign up information is provided on this After Visit Summary.  MyChart is used to connect with patients for Virtual Visits (Telemedicine).  Patients are able to view lab/test results, encounter notes, upcoming appointments, etc.  Non-urgent messages can be sent to your provider as well.   To learn more about what you can do with MyChart, go to [email or web address].    Your next appointment: 2 months     The format for your next appointment: Office     Provider: Dr.Harding  Dr.Owens office will call with appointment

## 2020-02-12 NOTE — Progress Notes (Signed)
Primary Care Provider: Gala Lewandowsky, MD Cardiologist: Glenetta Hew, MD Electrophysiologist: None  Vascular Sgx: Dr. Scot Dock  Clinic Note: Chief Complaint  Patient presents with  . Follow-up    Cardiac MRI results  . Tumor    Left atrial myxoma    HPI:    Kathryn Hancock is a 69 y.o. female-long-term smoker with hypertension, hyperlipidemia, and diabetes as well as documented AAA recently seen for evaluation of left atrial mass seen on CT scan echocardiogram at the request of Sistasis, Rowena, MD.  She presents now to discuss results of cardiac MRI.  Kathryn Hancock was seen for initial consultation on December 10, 2019 -> no real symptoms.  We ordered a cardiac MRI  Recent Hospitalizations: None  Reviewed  CV studies:    The following studies were reviewed today: (if available, images/films reviewed: From Epic Chart or Care Everywh she was 30 ere) . Cardiac MRI 01/22/2020: Normal LV size and function.  Moderate focal basal septal hypertrophy.  No S.A.M.  LVEF 61%.  RVEF 66%.  Large mobile mass in the left atrium attached to the interatrial septum-does not appear to thrombus characteristics consistent with left atrial myxoma.  Interval History:   Kathryn Hancock is here to discuss results of her cardiac MRI.  She still denies any cardiac symptoms besides occasional palpitations.  She has longstanding dyspnea and morning cough, which she relates to her long-term smoking.  Otherwise she denies any lightheadedness or dizziness, syncope/near syncope.  No chest pain or pressure with rest or exertion. No other heart failure symptoms.  No edema.  No TIA or amaurosis fugax symptoms.  CV Review of Symptoms (Summary) Cardiovascular ROS: no chest pain or dyspnea on exertion positive for - palpitations, shortness of breath and Significant anxiety with some palpitations. negative for - edema, irregular heartbeat, orthopnea, paroxysmal nocturnal dyspnea, rapid heart rate or Syncope/near syncope,  TIA/amaurosis fugax, claudication  The patient DOES NOT have symptoms concerning for COVID-19 infection (fever, chills, cough, or new shortness of breath).  The patient is practicing social distancing & Masking.    REVIEWED OF SYSTEMS   Review of Systems  Constitutional: Negative for malaise/fatigue and weight loss.  HENT: Positive for congestion (Occasionally in the morning). Negative for nosebleeds.   Respiratory: Positive for cough (Morning cough) and shortness of breath (Baseline-no worsening symptoms.). Negative for sputum production.   Gastrointestinal: Negative for blood in stool and melena.  Genitourinary: Negative for hematuria.  Musculoskeletal: Negative for joint pain.  Neurological: Negative for dizziness, focal weakness, loss of consciousness and headaches.  Psychiatric/Behavioral: Positive for depression. Negative for memory loss and suicidal ideas. The patient is nervous/anxious (Has had longstanding issues with intermittent anxiety). The patient does not have insomnia.        She is quite anxious, still grieving from her son's death.    I have reviewed and (if needed) personally updated the patient's problem list, medications, allergies, past medical and surgical history, social and family history.   PAST MEDICAL HISTORY   Past Medical History:  Diagnosis Date  . AAA (abdominal aortic aneurysm) ([place]) 06/2019   Multiple small pseudoaneurysmal projections of the dominant aorta.  Distal abdominal aortic aneurysm 4.5 x 4.7 cm.  Greatest AP dimension of the infrarenal aorta is 4.9.  No evidence of thoracic aortic aneurysm or dissection.  Brief segment of proximal IMA occlusion.  . CKD (chronic kidney disease) stage 3a, GFR 30-59 ml/min   . Coronary artery calcification seen on CAT scan 06/2019  . Diabetes mellitus  without complication (HCC)    Type 2  . Heavy smoker (more than 20 cigarettes per day)    ~ 2 ppd; since age 86 (53 pk yr)  . Hyperlipidemia    Mixed  .  Hypertension   . 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 -> TTE 07/24/19: Large size ill-defined echodensity in the left atrium suspicious for thrombus versus tumor.  Moderately dilated LA.--> CMR: Large mobile mass in the LA attached to the IAS-does not appear to be thrombus, characteristics consistent with LEFT ATRIAL MYXOMA  . Microscopic hematuria   . Plantar wart of right foot     PAST SURGICAL HISTORY   Past Surgical History:  Procedure Laterality Date  . Cardiac MRI  01/22/2020   Normal LV size and function.  Moderate focal basal septal hypertrophy.  No S.A.M.  LVEF 61%.  RVEF 66%.  Large mobile mass in the left atrium attached to the interatrial septum-does not appear to thrombus characteristics consistent with left atrial myxoma.  . Chest CTA  06/2019   Multiple small pseudoaneurysmal projections of the dominant aorta.  Distal abdominal aortic aneurysm 4.5 x 4.7 cm.  Greatest AP dimension of the infrarenal aorta is 4.9.  No evidence of thoracic aortic aneurysm or dissection.  Brief segment of proximal IMA occlusion.  Large geographic filling defect of the left atrium with associated dense radiopaque material.  Aortic atherosclerosis and emphysema  . COLONOSCOPY W/ POLYPECTOMY    . EYE SURGERY Bilateral    Cataract surgery  . TRANSTHORACIC ECHOCARDIOGRAM  07/2019    EF 60 to 65%.  GR 1 DD.  Elevated LVEDP.  Large size ill-defined echodensity in the left atrium suspicious for thrombus versus tumor.  Moderately dilated LA.  . TUBAL LIGATION      MEDICATIONS/ALLERGIES   Current Meds  Medication Sig  . apixaban (ELIQUIS) 5 MG TABS tablet Take 1 tablet (5 mg total) by mouth 2 (two) times daily.  . Ascorbic Acid (VITAMIN C) 1000 MG tablet Take 1,000 mg by mouth daily.  Marland Kitchen atorvastatin (LIPITOR) 20 MG tablet Take 20 mg by mouth daily.  . Cholecalciferol (VITAMIN D) 50 MCG (2000 UT) CAPS  Take by mouth.  . empagliflozin (JARDIANCE) 25 MG TABS tablet Take 25 mg by mouth daily.  . Exenatide ER (BYDUREON BCISE) 2 MG/0.85ML AUIJ Inject into the skin. Inject 2 mg every week with meals.  Marland Kitchen glucose blood test strip 1 each by Other route as needed. Use as instructed  . losartan (COZAAR) 100 MG tablet Take 100 mg by mouth daily.  . metFORMIN (GLUCOPHAGE) 1000 MG tablet Take 1,000 mg by mouth 2 (two) times daily with a meal.  . montelukast (SINGULAIR) 10 MG tablet Take 10 mg by mouth at bedtime.  Marland Kitchen omeprazole (PRILOSEC) 20 MG capsule Take 20 mg by mouth daily.    Allergies  Allergen Reactions  . Penicillins     SOCIAL HISTORY/FAMILY HISTORY   Social History   Tobacco Use  . Smoking status: Current Every Day Smoker    Packs/day: 2.00    Years: 50.00    Pack years: 100.00    Types: Cigarettes  . Smokeless tobacco: Never Used  Vaping Use  . Vaping Use: Never used  Substance Use Topics  . Alcohol use: Not Currently  . Drug use: Never   Social History   Social History Narrative   She is a mother of 2-but her  son died in April 2021 of a drug overdose.   Denies alcohol use.   Family History  Problem Relation Age of Onset  . Diabetes Mother   . Diabetes Father   . Heart attack Father 71  . CAD Father   . Hyperlipidemia Father   . Hypertension Father     OBJCTIVE -PE, EKG, labs   Wt Readings from Last 3 Encounters:  02/12/20 145 lb 9.6 oz (66 kg)  12/10/19 147 lb 12.8 oz (67 kg)  07/01/19 146 lb (66.2 kg)    Physical Exam: BP (!) 160/92   Pulse 91   Ht 5\' 3"  (1.6 m)   Wt 145 lb 9.6 oz (66 kg)   SpO2 98%   BMI 25.79 kg/m  Physical Exam Vitals reviewed.  Constitutional:      General: She is not in acute distress.    Appearance: Normal appearance. She is normal weight. She is not ill-appearing.     Comments: Well-groomed.  Appears older than stated age.  HENT:     Head: Normocephalic and atraumatic.  Cardiovascular:     Rate and Rhythm: Normal rate  and regular rhythm.     Pulses: Normal pulses.     Heart sounds: Normal heart sounds. No murmur heard.  No friction rub. No gallop.      Comments: Nondisplaced PMI Pulmonary:     Comments: Slightly diminished breath sounds but no obvious wheezes rales or rhonchi Abdominal:     Comments: No HSM  Musculoskeletal:        General: No swelling. Normal range of motion.  Neurological:     General: No focal deficit present.     Mental Status: She is alert and oriented to person, place, and time.  Psychiatric:        Mood and Affect: Mood normal.        Behavior: Behavior normal.        Thought Content: Thought content normal.        Judgment: Judgment normal.     Adult ECG Report  Rate: 85 ;  Rhythm: normal sinus rhythm and Normal axis, intervals and durations.;   Narrative Interpretation: Normal  Recent Labs:  10/14/2019  Na+ 146, K+ 4.8, Cl- 107, HCO3- 14 , BUN 19, Cr 1.38 (GFR ~40), Glu 117, Ca2+ 9.6; AST 18, ALT 16, AlkP 132  CBC: W 12.4, H/H 14.0/42.0%, Plt 244  No results found for: CHOL, HDL, LDLCALC, LDLDIRECT, TRIG, CHOLHDL Lab Results  Component Value Date   CREATININE 1.26 (H) 01/14/2020   BUN 15 01/14/2020   NA 139 01/14/2020   K 4.7 01/14/2020   CL 102 01/14/2020   CO2 20 01/14/2020   No results found for: TSH  ASSESSMENT/PLAN    Problem List Items Addressed This Visit    Left atrial myxoma - Primary (Chronic)    As expected, cardiac MRI was more definitive in determining likely etiology was left atrial myxoma.  Plan: Continue anticoagulation with Eliquis. As planned, will refer to CVTS to consider preop evaluation.  I suspect that she will probably need preop ischemic evaluation with either coronary CTA versus cardiac catheterization (will wait to see with the decision from this consult prior to scheduling any invasive testing)      Relevant Orders   Ambulatory referral to Cardiothoracic Surgery   Tobacco abuse (Chronic)    Smoking cessation  instruction/counseling given:  counseled patient on the dangers of tobacco use, advised patient to stop smoking, and reviewed strategies to  maximize success; does not seem to be interested in cessation at this point.      Coronary artery calcification seen on CAT scan (Chronic)    No anginal symptoms. Cardiac risk factors include smoking, hypertension, hyperlipidemia and diabetes. Is on aspirin, statin and ARB along with Jardiance.  She will likely require ischemic evaluation prior to any cardiac surgery.  Need to determine if she is indeed going to proceed with surgery for removal of left atrial mass.  Plan: Pending results of referral to CVTS ref surgery for removal of myxoma, anticipate request for cardiac catheterization versus potentially coronary CTA.      Relevant Orders   Ambulatory referral to Cardiothoracic Surgery   AAA (abdominal aortic aneurysm) without rupture (HCC) (Chronic)    1 or 2 major incidental findings when evaluated for flank pain.  She was initially seen in consultation by Dr. Gae Gallop from vascular surgery--we will need to follow-up once left atrial myxoma dealt with.      Relevant Orders   Ambulatory referral to Cardiothoracic Surgery       COVID-19 Education: The signs and symptoms of COVID-19 were discussed with the patient and how to seek care for testing (follow up with PCP or arrange E-visit).   The importance of social distancing and COVID-19 vaccination was discussed today.  I spent a total of 26 minutes with the patient in direct patient consultation.  Additional time spent with chart review  / charting (studies, outside notes, etc): 16 min.  Total Time: 77min   Current medicines are reviewed at length with the patient today.  (+/- concerns) n/a  Notice: This dictation was prepared with Dragon dictation along with smaller phrase technology. Any transcriptional errors that result from this process are unintentional and may not be corrected  upon review.  Patient Instructions / Medication Changes & Studies & Tests Ordered   Patient Instructions  Medication Instructions:  Continue same medications *If you need a refill on your cardiac medications before your next appointment, please call your pharmacy*   Lab Work: None ordered   Testing/Procedures: None ordered   Follow-Up: At [facility] you and your health needs are our priority.  As part of our continuing mission to provide you with exceptional heart care, we have created designated Provider Care Teams.  These Care Teams include your primary Cardiologist (physician) and Advanced Practice Providers (APPs -  Physician Assistants and Nurse Practitioners) who all work together to provide you with the care you need, when you need it.  We recommend signing up for the patient portal called "MyChart".  Sign up information is provided on this After Visit Summary.  MyChart is used to connect with patients for Virtual Visits (Telemedicine).  Patients are able to view lab/test results, encounter notes, upcoming appointments, etc.  Non-urgent messages can be sent to your provider as well.   To learn more about what you can do with MyChart, go to [email or web address].    Your next appointment: 2 months     The format for your next appointment: Office     Provider: Dr.Sony Schlarb  Dr.Owens office will call with appointment     Studies Ordered:   Orders Placed This Encounter  Procedures  . Ambulatory referral to Cardiothoracic Surgery     Glenetta Hew, M.D., M.S. Interventional Cardiologist   Pager # [phone] Phone # [phone] [address] [place], [place] [address]   Thank you for choosing Heartcare at [facility]!!

## 2020-02-15 ENCOUNTER — Encounter: Payer: Self-pay | Admitting: Cardiology

## 2020-02-15 NOTE — Assessment & Plan Note (Signed)
1 or 2 major incidental findings when evaluated for flank pain.  She was initially seen in consultation by Dr. Gae Gallop from vascular surgery--we will need to follow-up once left atrial myxoma dealt with.

## 2020-02-15 NOTE — Assessment & Plan Note (Signed)
No anginal symptoms. Cardiac risk factors include smoking, hypertension, hyperlipidemia and diabetes. Is on aspirin, statin and ARB along with Jardiance.  She will likely require ischemic evaluation prior to any cardiac surgery.  Need to determine if she is indeed going to proceed with surgery for removal of left atrial mass.  Plan: Pending results of referral to CVTS ref surgery for removal of myxoma, anticipate request for cardiac catheterization versus potentially coronary CTA.

## 2020-02-15 NOTE — Assessment & Plan Note (Signed)
Smoking cessation instruction/counseling given:  counseled patient on the dangers of tobacco use, advised patient to stop smoking, and reviewed strategies to maximize success; does not seem to be interested in cessation at this point.

## 2020-02-15 NOTE — Assessment & Plan Note (Signed)
As expected, cardiac MRI was more definitive in determining likely etiology was left atrial myxoma.  Plan: Continue anticoagulation with Eliquis. As planned, will refer to CVTS to consider preop evaluation.  I suspect that she will probably need preop ischemic evaluation with either coronary CTA versus cardiac catheterization (will wait to see with the decision from this consult prior to scheduling any invasive testing)

## 2020-02-26 ENCOUNTER — Telehealth: Payer: Self-pay | Admitting: Cardiology

## 2020-02-26 NOTE — Telephone Encounter (Signed)
New message     Calling to let Dr Ellyn Hack know that they have not heard from the heart surgeons office.  Patient was told to call back today if they had not received an appt.

## 2020-05-11 HISTORY — PX: LEFT HEART CATH AND CORONARY ANGIOGRAPHY: CATH118249

## 2020-05-16 ENCOUNTER — Other Ambulatory Visit: Payer: Self-pay

## 2020-05-16 ENCOUNTER — Institutional Professional Consult (permissible substitution) (INDEPENDENT_AMBULATORY_CARE_PROVIDER_SITE_OTHER): Payer: Medicare Other | Admitting: Thoracic Surgery (Cardiothoracic Vascular Surgery)

## 2020-05-16 ENCOUNTER — Encounter: Payer: Self-pay | Admitting: Thoracic Surgery (Cardiothoracic Vascular Surgery)

## 2020-05-16 VITALS — BP 173/77 | HR 88 | Temp 98.1°F | Resp 18 | Ht 63.0 in | Wt 145.6 lb

## 2020-05-16 DIAGNOSIS — D151 Benign neoplasm of heart: Secondary | ICD-10-CM | POA: Diagnosis not present

## 2020-05-16 DIAGNOSIS — I2510 Atherosclerotic heart disease of native coronary artery without angina pectoris: Secondary | ICD-10-CM

## 2020-05-16 NOTE — Progress Notes (Signed)
[address]       [place] [address]             [phone]     CARDIOTHORACIC SURGERY CONSULTATION REPORT  Referring Provider is Leonie Man, MD PCP is Gala Lewandowsky, MD  Chief Complaint  Patient presents with  . 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  Patient's history dates back to 2020 when she underwent CT scan for evaluation of flank pain suspicious for kidney stone.  She was found to have urinary tract infection but CT scan revealed a moderate to large size complex aneurysm involving the infrarenal abdominal aorta with maximum diameter 4.9 cm.  She was referred for vascular surgical consultation and evaluated by Dr. Scot Dock April 01, 2019.  Follow-up CT angiogram was obtained for surgical planning and the patient was seen in follow-up by Dr. Scot Dock in January 2020.  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  CT angiogram also revealed significant coronary artery calcifications suspicious for coronary artery disease.  A transthoracic echocardiogram was performed July 24, 2019 confirming the presence of a large ill-defined mass in the left atrium that was felt to be suspicious for either organized thrombus or tumor.  Left ventricular function appeared normal.  Mitral valve function appeared normal.  No other significant abnormalities were noted.  The patient was referred for cardiology consultation and evaluated by Dr. Ellyn Hack on December 10, 2019.  Cardiac MRI was recommended and performed on January 22, 2020, confirming the presence of a large mobile mass in the left atrium that appeared to be attached to the intra-atrial septum and consistent with myxoma.  The patient was seen in follow-up by Dr. Ellyn Hack in early September and cardiothoracic surgical consultation was requested.  Patient is widowed and lives with her daughter and several grandchildren locally in [place].  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  She lives a sedentary lifestyle and does not exercise or get out of the house much at all.  She is limited by bilateral calf claudication.  She also experiences some exertional shortness of breath which is chronic and stable.  She denies resting shortness of breath, PND, orthopnea, or lower extremity edema.  She has not had palpitations or syncope.  She reports occasional dizzy spells.  She denies any exertional chest pain or chest tightness.  She has not had transient monocular blindness no other symptoms suggestive of TIA or stroke.  She smokes between 1 and 2 packs of cigarettes daily and has been smoking since she was age 74.  She does not drink alcohol.   Past Medical History:  Diagnosis Date  . AAA (abdominal aortic aneurysm) ([place]) 06/2019   Multiple small pseudoaneurysmal projections of the dominant aorta.  Distal abdominal aortic aneurysm 4.5 x 4.7 cm.  Greatest AP dimension of the infrarenal aorta is 4.9.  No evidence of thoracic aortic aneurysm or dissection.  Brief segment of proximal IMA occlusion.  . CKD (chronic kidney  disease) stage 3a, GFR 30-59 ml/min   . Coronary artery calcification seen on CAT scan 06/2019  . Diabetes mellitus without complication (HCC)    Type 2  . Heavy smoker (more than 20 cigarettes per day)    ~ 2 ppd; since age 66 (3 pk yr)  . Hyperlipidemia    Mixed  . Hypertension   . 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 -> TTE 07/24/19: Large size ill-defined echodensity in the left atrium suspicious for thrombus versus tumor.  Moderately dilated LA.--> CMR: Large mobile mass in the LA attached to the IAS-does not appear to be thrombus, characteristics consistent with LEFT ATRIAL MYXOMA  . Microscopic hematuria   . Plantar wart of right foot     Past Surgical History:  Procedure Laterality Date  . Cardiac MRI  01/22/2020   Normal LV size and function.  Moderate focal basal septal hypertrophy.  No S.A.M.  LVEF 61%.  RVEF 66%.  Large mobile mass in the left atrium attached to the interatrial septum-does not appear to thrombus characteristics consistent with left atrial myxoma.  . Chest CTA  06/2019   Multiple small pseudoaneurysmal projections of the dominant aorta.  Distal abdominal aortic aneurysm 4.5 x 4.7 cm.  Greatest AP dimension of the infrarenal aorta is 4.9.  No evidence of thoracic aortic aneurysm or dissection.  Brief segment of proximal IMA occlusion.  Large geographic filling defect of the left atrium with associated dense radiopaque material.  Aortic atherosclerosis and emphysema  . COLONOSCOPY W/ POLYPECTOMY    . EYE SURGERY Bilateral    Cataract surgery  . TRANSTHORACIC ECHOCARDIOGRAM  07/2019    EF 60 to 65%.  GR 1 DD.  Elevated LVEDP.  Large size ill-defined echodensity in the left atrium suspicious for thrombus versus tumor.  Moderately dilated LA.  . TUBAL LIGATION      Family History  Problem Relation Age of Onset  . Diabetes Mother   . Diabetes Father   . Heart attack Father 30  . CAD Father   . Hyperlipidemia Father   . Hypertension Father     Social History   Socioeconomic History  . Marital status: Widowed    Spouse name: Not on file  . Number of children: Not on file  . Years of education: Not on file  . Highest education level: Not on file  Occupational History  . Not on file  Tobacco Use  .  Smoking status: Current Every Day Smoker    Packs/day: 2.00    Years: 50.00    Pack years: 100.00    Types: Cigarettes  . Smokeless tobacco: Never Used  Vaping Use  . Vaping Use: Never used  Substance and Sexual Activity  . Alcohol use: Not Currently  . Drug use: Never  . Sexual activity: Not on file  Other Topics Concern  . Not on file  Social History Narrative   She is a mother of 2-but her son died in 10-05-19 of a drug overdose.   Denies alcohol use.   Social Determinants of Health   Financial Resource Strain:   . Difficulty of Paying Living Expenses: Not on file  Food Insecurity:   . Worried About Charity fundraiser in the Last Year: Not on file  . Ran Out of Food in the Last Year: Not on file  Transportation Needs:   . Lack of Transportation (Medical): Not  on file  . Lack of Transportation (Non-Medical): Not on file  Physical Activity:   . Days of Exercise per Week: Not on file  . Minutes of Exercise per Session: Not on file  Stress:   . Feeling of Stress : Not on file  Social Connections:   . Frequency of Communication with Friends and Family: Not on file  . Frequency of Social Gatherings with Friends and Family: Not on file  . Attends Religious Services: Not on file  . Active Member of Clubs or Organizations: Not on file  . Attends Archivist Meetings: Not on file  . Marital Status: Not on file  Intimate Partner Violence:   . Fear of Current or Ex-Partner: Not on file  . Emotionally Abused: Not on file  . Physically Abused: Not on file  . Sexually Abused: Not on file    Current Outpatient Medications  Medication Sig Dispense Refill  . apixaban (ELIQUIS) 5 MG TABS tablet Take 1 tablet (5 mg total) by mouth 2 (two) times daily. 60 tablet 6  . Ascorbic Acid (VITAMIN C) 1000 MG tablet Take 1,000 mg by mouth daily.    Marland Kitchen atorvastatin (LIPITOR) 20 MG tablet Take 20 mg by mouth daily.    . Cholecalciferol (VITAMIN D) 50 MCG (2000 UT) CAPS Take by  mouth.    . empagliflozin (JARDIANCE) 25 MG TABS tablet Take 25 mg by mouth daily.    . Exenatide ER (BYDUREON BCISE) 2 MG/0.85ML AUIJ Inject into the skin. Inject 2 mg every week with meals.    . fluticasone (FLONASE) 50 MCG/ACT nasal spray Place 2 sprays into both nostrils as needed for allergies or rhinitis.    Marland Kitchen glucose blood test strip 1 each by Other route as needed. Use as instructed    . losartan (COZAAR) 100 MG tablet Take 100 mg by mouth daily.    . metFORMIN (GLUCOPHAGE) 1000 MG tablet Take 1,000 mg by mouth 2 (two) times daily with a meal.    . montelukast (SINGULAIR) 10 MG tablet Take 10 mg by mouth as needed.      No current facility-administered medications for this visit.    Allergies  Allergen Reactions  . Penicillins       Review of Systems:   General:  poor appetite, decreased energy, no weight gain, 5 lb weight loss, no fever  Cardiac:  no chest pain with exertion, no chest pain at rest, + SOB with exertion, no resting SOB, no PND, no orthopnea, no palpitations, no arrhythmia, no atrial fibrillation, no LE edema, occasional dizzy spells, no syncope  Respiratory:  + exertional shortness of breath, no home oxygen, occasional productive cough, occasional dry cough, no bronchitis, no wheezing, no hemoptysis, no asthma, no pain with inspiration or cough, no sleep apnea, no CPAP at night  GI:   no difficulty swallowing, no reflux, no frequent heartburn, no hiatal hernia, no abdominal pain, no constipation, + diarrhea, no hematochezia, no hematemesis, no melena  GU:   no dysuria,  no frequency, no urinary tract infection, no hematuria, no kidney stones, no kidney disease  Vascular:  + pain suggestive of claudication, no pain in feet, no leg cramps, no varicose veins, no DVT, no non-healing foot ulcer  Neuro:   no stroke, no TIA's, no seizures, no headaches, no temporary blindness one eye,  no slurred speech, no peripheral neuropathy, no chronic pain, no instability of gait, no  memory/cognitive dysfunction  Musculoskeletal: no arthritis, no joint swelling, no myalgias,  some difficulty walking, decreased mobility   Skin:   no rash, no itching, no skin infections, no pressure sores or ulcerations  Psych:   + anxiety, + depression, + nervousness, + unusual recent stress  Eyes:   no blurry vision, no floaters, no recent vision changes, does not wear glasses or contacts  ENT:   no hearing loss, no loose or painful teeth, edentulous with full dentures  Hematologic:  no easy bruising, no abnormal bleeding, no clotting disorder, no frequent epistaxis  Endocrine:  + diabetes, does check CBG's at home     Physical Exam:   BP (!) 173/77 (BP Location: Right Arm, Patient Position: Sitting, Cuff Size: Normal)   Pulse 88   Temp 98.1 F (36.7 C) (Skin)   Resp 18   Ht 5\' 3"  (1.6 m)   Wt 145 lb 9.6 oz (66 kg)   SpO2 99% Comment: RA  BMI 25.79 kg/m   General:  Adult female NAD  HEENT:  Unremarkable   Neck:   no JVD, no bruits, no adenopathy   Chest:   clear to auscultation, symmetrical breath sounds, no wheezes, no rhonchi   CV:   RRR, no murmur   Abdomen:  soft, non-tender, no masses   Extremities:  warm, well-perfused, pedal pulses not palpable, no LE edema  Rectal/GU  Deferred  Neuro:   Grossly non-focal and symmetrical throughout  Skin:   Clean and dry, no rashes, no breakdown   Diagnostic Tests:  CT ANGIOGRAPHY CHEST, ABDOMEN AND PELVIS  TECHNIQUE: Multidetector CT imaging through the chest, abdomen and pelvis was performed using the standard protocol during bolus administration of intravenous contrast. Multiplanar reconstructed images and MIPs were obtained and reviewed to evaluate the vascular anatomy.  Creatinine was obtained on site at [facility] at [address]  Results: Creatinine 1.2 mg/dL.  GFR 47.  CONTRAST:  57mL ISOVUE-370 IOPAMIDOL (ISOVUE-370) INJECTION 76%  COMPARISON:  CT urogram 03/06/2019  FINDINGS: CTA CHEST  FINDINGS  Cardiovascular: Thoracic aorta is normal in caliber without evidence of aneurysm or dissection. There is moderate calcified plaque over the thoracic aorta. Normal 3 vessel takeoff from the aortic arch. Mild calcified plaque over the 3 vessel coronary arteries. Remaining vascular structures are unremarkable. Heart is normal size. There is a well-defined large geographic filling defect with low attenuation filling most of the left atrium with dense radiopaque material versus calcification along the periphery of this filling defect. This dense material within the left atrium is unchanged from the previous CT.  Mediastinum/Nodes: No mediastinal or hilar adenopathy. Remaining mediastinal structures are within normal.  Lungs/Pleura: Lungs are adequately inflated. There is mild centrilobular emphysematous disease. No evidence of focal airspace consolidation or effusion. Airways are normal.  Musculoskeletal: No acute findings.  Review of the MIP images confirms the above findings.  CTA ABDOMEN AND PELVIS FINDINGS  VASCULAR  Aorta: There is mild calcified plaque throughout the abdominal aorta with moderate mural thrombus throughout the abdominal aorta. Small left posterolateral pseudo aneurysmal projection of the proximal suprarenal abdominal aorta unchanged. The abdominal aorta measures 3.2 cm in AP diameter immediately below the takeoff of the right renal artery and just above the takeoff of the left renal artery. This is unchanged. Small posterior saccular projections from the proximal abdominal aorta unchanged. Small posterior projection of the mid abdominal aorta just left of midline unchanged. Distal abdominal aorta measures 4.5 x 4.7 cm in transverse in AP dimension without significant change. There is low-attenuation material abutting the posterior  border of the distal abdominal aorta just above this area of aneurysmal dilatation likely chronic  contained rupture as the aorta measures approximately 4.9 cm in AP diameter 1 including this posterior projection unchanged. Mild right lateral pseudo aneurysmal projection of the distal abdominal aorta just above the bifurcation unchanged.  Celiac: Patent.  SMA: Patent.  Renals: Calcified plaque at the origin and proximal renal arteries as the renal arteries are otherwise patent.  IMA: Occluded over its proximal 3.7 cm with reconstitution.  Inflow: Moderate calcified plaque over the iliac arteries without significant focal stenosis or occlusion.  Veins: Unremarkable  Review of the MIP images confirms the above findings.  NON-VASCULAR  Hepatobiliary: Mild nodular contour of the liver without focal mass. There is mild to moderate cholelithiasis. Biliary tree is normal.  Pancreas: Normal.  Spleen: 1 cm hypodensity unchanged likely cyst or hemangioma.  Adrenals/Urinary Tract: Stable prominence of the adrenal glands likely adenomas. Kidneys are normal size without hydronephrosis or nephrolithiasis. Ureters and bladder are normal.  Stomach/Bowel: Stomach and small bowel are normal. Appendix is normal. Colon is normal.  Lymphatic: No adenopathy.  Reproductive: Unremarkable.  Other: No free fluid or free peritoneal air. No focal inflammatory change.  Musculoskeletal: No acute findings.  Review of the MIP images confirms the above findings.  IMPRESSION: 1. There are multiple small pseudoaneurysmal projections of the abdominal aorta as described most below the level of the renal arteries. The distal abdominal aortic aneurysm measures 4.5 x 4.7 cm in transverse in AP dimension without significant change. Greatest AP dimension of the infrarenal aorta is 4.9 cm as described including a posterior pseudo aneurysmal projection as this is unchanged.  2. No evidence of aortic aneurysm or dissection involving the thoracic aorta.  3. Occlusion of the  proximal 3.5 cm of the IMA with subsequent reconstitution.  4. Large geographic filling defect of the left atrium with associated dense radiopaque material as this dense material is unchanged from the previous CT. Findings may be due to previous procedure involving the left atrium versus thrombus or mass. Recommend clinical correlation and echocardiogram if indicated.  5. Aortic Atherosclerosis (ICD10-I70.0) and Emphysema (ICD10-J43.9). Atherosclerotic coronary artery disease.  6.  Moderate cholelithiasis.  7.  1 cm splenic hypodensity unchanged likely a cyst or hemangioma.  8. Mild nodular contour of the liver which may be due to mild degree of cirrhosis.  9.  .   Electronically Signed   By: Marin Olp M.D.   On: 06/26/2019 14:23      ECHOCARDIOGRAM REPORT     Patient Name:  RASHAY BARNETTE Date of Exam: 07/24/2019  Medical Rec #: [id number]  Height:    63.0 in  Accession #:  [id number]  Weight:    146.0 lb  Date of Birth: 1951-04-18   BSA:     1.69 m  Patient Age:  34 years   BP:      189/104 mmHg  Patient Gender: F      HR:      89 bpm.  Exam Location: Outpatient   Procedure: 2D Echo   Indications:  left atrial mass I51.89           AAA I71.4    History:    Patient has no prior history of Echocardiogram  examinations.         Risk Factors:Hypertension, Dyslipidemia, Diabetes and  Current         Smoker. AAA.    Sonographer:  Jannett Celestine RDCS (AE)  Referring Phys: [phone]  CHRISTOPHER S DICKSON   IMPRESSIONS    1. Left ventricular ejection fraction, by estimation, is 60 to 65%. The  left ventricle has normal function. The left ventricle has no regional  wall motion abnormalities. There is mildly increased left ventricular  hypertrophy. Left ventricular diastolic  parameters are consistent with Grade I diastolic dysfunction (impaired  relaxation). Elevated left  ventricular end-diastolic pressure.  2. Right ventricular systolic function is normal. The right ventricular  size is normal.  3. There is a large ill-defined echodensity in the left atrium, which is  suspicious for organized thrombus or less likely tumor.. Left atrial size  was moderately dilated.  4. The mitral valve is grossly normal. Trivial mitral valve  regurgitation.  5. The aortic valve is tricuspid. Aortic valve regurgitation is not  visualized. Mild aortic valve sclerosis is present, with no evidence of  aortic valve stenosis.  6. The inferior vena cava is normal in size with <50% respiratory  variability, suggesting right atrial pressure of 8 mmHg.   Conclusion(s)/Recomendation(s): Large echogenic mass noted in the left  atrium, corresponding to filling defect in a recent CT angiogram. Suspect  this is organized thrombus or less likely tumor. Consider further imaging  with TEE and/or cMRI and cardiac  surgery consultation.   FINDINGS  Left Ventricle: Left ventricular ejection fraction, by estimation, is 60  to 65%. The left ventricle has normal function. The left ventricle has no  regional wall motion abnormalities. There is mildly increased left  ventricular hypertrophy. Left  ventricular diastolic parameters are consistent with Grade I diastolic  dysfunction (impaired relaxation). Elevated left ventricular end-diastolic  pressure.   Right Ventricle: The right ventricular size is normal. No increase in  right ventricular wall thickness. Right ventricular systolic function is  normal.   Left Atrium: There is a large ill-defined echodensity in the left atrium,  which is suspicious for organized thrombus or less likely tumor. Left  atrial size was moderately dilated.   Right Atrium: Right atrial size was normal in size.   Pericardium: There is no evidence of pericardial effusion.   Mitral Valve: The mitral valve is grossly normal. Trivial mitral valve   regurgitation.   Tricuspid Valve: The tricuspid valve is grossly normal. Tricuspid valve  regurgitation is trivial.   Aortic Valve: The aortic valve is tricuspid. Aortic valve regurgitation is  not visualized. Mild aortic valve sclerosis is present, with no evidence  of aortic valve stenosis.   Pulmonic Valve: The pulmonic valve was grossly normal. Pulmonic valve  regurgitation is not visualized.   Aorta: The aortic root, ascending aorta, aortic arch and descending aorta  are all structurally normal, with no evidence of dilitation or  obstruction.   Venous: The inferior vena cava is normal in size with less than 50%  respiratory variability, suggesting right atrial pressure of 8 mmHg.   IAS/Shunts: The interatrial septum was not well visualized.     LEFT VENTRICLE  PLAX 2D  LVIDd:     4.70 cm Diastology  LVIDs:     3.00 cm LV e' lateral:  3.15 cm/s  LV PW:     1.10 cm LV E/e' lateral: 26.2  LV IVS:    1.10 cm LV e' medial:  6.64 cm/s  LVOT diam:   1.80 cm LV E/e' medial: 12.4  LV SV:     40.46 ml  LV SV Index:  38.94  LVOT Area:   2.54 cm     RIGHT VENTRICLE  TAPSE (M-mode): 1.7  cm   LEFT ATRIUM      Index    RIGHT ATRIUM      Index  LA diam:   3.50 cm 2.07 cm/m RA Area:   13.60 cm  LA Vol (A4C): 74.7 ml 44.16 ml/m RA Volume:  27.30 ml 16.14 ml/m  AORTIC VALVE  LVOT Vmax:  85.80 cm/s  LVOT Vmean: 58.900 cm/s  LVOT VTI:  0.159 m    AORTA  Ao Root diam: 3.40 cm   MITRAL VALVE  MV Area (PHT): 2.33 cm   SHUNTS  MV Decel Time: 325 msec   Systemic VTI: 0.16 m  MV E velocity: 82.60 cm/s  Systemic Diam: 1.80 cm  MV A velocity: 144.00 cm/s  MV E/A ratio: 0.57   Lyman Bishop MD  Electronically signed by Lyman Bishop MD  Signature Date/Time: 07/24/2019/4:00:00 PM      CARDIAC MRI  TECHNIQUE: The patient was scanned on a 1.5 Tesla GE magnet. A dedicated cardiac coil was used.  Functional imaging was done using Fiesta sequences. 2,3, and 4 chamber views were done to assess for RWMA's. Modified Simpson's rule using a short axis stack was used to calculate an ejection fraction on a dedicated work Conservation officer, nature. The patient received 8 cc of Gadavist. After 10 minutes inversion recovery sequences were used to assess for infiltration and scar tissue.  FINDINGS: Limited images of the lung fields showed no gross abnormalities.  Trivial pericardial effusion. Normal left ventricular size with moderate focal basal septal hypertrophy. No mitral valve systolic anterior motion or evidence for LV outflow tract gradient. Normal LV systolic function with normal wall motion, EF 61%. Normal right ventricular size and systolic function, EF 15%. Moderate left atrial enlargement. There is no thrombus in the left atrial appendage. There is a large (5.8 x 4.2 cm), mobile heterogeneous left atrial mass that is isointense on T1 imaging and hyperintense on T2 imaging, it appears to be attached to interatrial septum. Normal right atrial size. Trileaflet aortic valve with no stenosis or regurgitation. No significant mitral regurgitation noted.  CONTRAST IMAGES WERE NOT SENT TO WORK STATION, WILL NEED TO ADDEND STUDY WHEN AVAILABLE.  Measurements:  LVEDV 128 mL  LVSV 78 mL  LVEF 61%  RVEDV 87 mL RVSV 57 mL RVEF 66%  IMPRESSION: 1. Normal LV size with moderate focal basal septal hypertrophy. No mitral valve SAM or evidence for LVOT gradient. Normal LV systolic function, EF 40%.  2.  Normal RV size and systolic function, EF 08%.  3. Large mobile mass in the left atrium attached to the interatrial septum. It does not appear to be a thrombus. Characteristics suggestive of LA myxoma.  4. Contrast images not sent to work station, will have to added study when available.  Dalton Mclean   Electronically Signed   By: Loralie Champagne M.D.    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  The mass is very large and fills the majority of the left atrium.  The patient has mild chronic exertional shortness of breath but otherwise no symptoms suggestive of congestive heart failure.  She denies any history suspicious for previous TIA or stroke.  She does have chronic bilateral calf claudication that is lifestyle limiting.   Plan:  I have reviewed images from the patient's recent cardiac MRI with the patient  and her daughter in the office today.  We discussed the nature of this clinical problem and the likelihood that this represents a benign myxoma.  We discussed the high risks associated with this condition including concerns for the possibility of embolization and/or acute onset diastolic congestive heart failure.  We discussed the fact that surgical removal is usually curative providing that final pathology is consistent with benign atrial myxoma.  Lack of suitable treatment options was discussed.  Surgical approaches were discussed including the possibility that the patient may have significant underlying coronary artery disease that would need to be addressed at the time of surgery.  The impact of longstanding tobacco abuse on the patient's current and long-term health was discussed.  All questions have been answered.  As the next step the patient needs diagnostic cardiac catheterization.  I think transesophageal echocardiogram would be helpful to further evaluate the size and anatomic location of the stalk from which this mass originates.  I favor proceeding with elective surgical intervention in the immediate future.  The patient is contemplating waiting until after the holidays before proceeding with surgery but will discuss the timing of surgery further with  the remaining family.  In the meanwhile we will request that the patient undergo TEE and diagnostic cardiac catheterization as soon as practical.  The patient understands that Eliquis will need to be stopped prior to catheterization.  Smoking cessation has been advised.  All questions answered.  I spent in excess of 90 minutes during the conduct of this office consultation and >50% of this time involved direct face-to-face encounter with the patient for counseling and/or coordination of their care.    Valentina Gu. Roxy Manns, MD 05/16/2020 2:08 PM

## 2020-05-16 NOTE — Patient Instructions (Addendum)
Stop smoking immediately and permanently.  Continue all previous medications without any changes at this time  Schedule heart cath and TEE with Dr Ellyn Hack  Call Levonne Spiller in our office to schedule surgery [phone]

## 2020-05-17 ENCOUNTER — Telehealth: Payer: Self-pay | Admitting: *Deleted

## 2020-05-17 DIAGNOSIS — Z01810 Encounter for preprocedural cardiovascular examination: Secondary | ICD-10-CM

## 2020-05-17 DIAGNOSIS — Z79899 Other long term (current) drug therapy: Secondary | ICD-10-CM

## 2020-05-17 NOTE — Telephone Encounter (Signed)
Spoke to patient .  RN informed patient per Dr Ellyn Hack , needs an TEE and Right and left heart cath on the same day> Next available day will be May 30, 2020 .  patient is in agreement and request written instruction mailed as well over verbal instruction will be given.

## 2020-05-19 NOTE — Telephone Encounter (Signed)
Spoke to patient's daughter-- [name] - instruction verbal given and mailed . Voiced understanding.  Written instruction mailed .

## 2020-05-19 NOTE — Telephone Encounter (Signed)
Follow Up:     Pt said she was told to go to the lab, she needs to know what lab? She needs for you to talk to her daughter about this and her other instructions. Please call back after 4:00 today, if possible please.

## 2020-05-24 ENCOUNTER — Other Ambulatory Visit: Payer: Self-pay | Admitting: *Deleted

## 2020-05-24 DIAGNOSIS — D151 Benign neoplasm of heart: Secondary | ICD-10-CM

## 2020-05-24 DIAGNOSIS — I2510 Atherosclerotic heart disease of native coronary artery without angina pectoris: Secondary | ICD-10-CM

## 2020-05-25 NOTE — Telephone Encounter (Signed)
BMET and CBC entered for [facility]. Curt Bears notified she will bring in Thurs morning.

## 2020-05-25 NOTE — Telephone Encounter (Signed)
[name] is calling stating they were advised [name] needs labs prior to her test, but there is no active request. She is wanting to bring her tomorrow. Please advise.

## 2020-05-25 NOTE — Addendum Note (Signed)
Addended by: Waylan Rocher on: 05/25/2020 11:40 AM   Modules accepted: Orders

## 2020-05-26 ENCOUNTER — Telehealth: Payer: Self-pay | Admitting: *Deleted

## 2020-05-26 ENCOUNTER — Other Ambulatory Visit (HOSPITAL_COMMUNITY)
Admission: RE | Admit: 2020-05-26 | Discharge: 2020-05-26 | Disposition: A | Discharge status: Home / Self Care | Payer: Medicare Other | Source: Ambulatory Visit | Attending: Cardiology | Admitting: Cardiology

## 2020-05-26 DIAGNOSIS — Z20822 Contact with and (suspected) exposure to covid-19: Secondary | ICD-10-CM | POA: Diagnosis not present

## 2020-05-26 DIAGNOSIS — Z01812 Encounter for preprocedural laboratory examination: Secondary | ICD-10-CM | POA: Insufficient documentation

## 2020-05-26 LAB — SARS CORONAVIRUS 2 (TAT 6-24 HRS): SARS Coronavirus 2: NEGATIVE

## 2020-05-26 NOTE — Telephone Encounter (Signed)
Erroneous encounter

## 2020-05-26 NOTE — Telephone Encounter (Addendum)
Pt contacted pre-catheterization scheduled at [facility] for: Monday May 30, 2020 12 Noon Verified arrival time and place: [facility] at: 9 AM/TEE 10 AM   Nothing to eat or drink after midnight prior to procedures, may have sips of water to take medications.  Hold: Eliquis-per Dr Zorita Pang 05/29/20 until post procedure Metformin-day of procedure and 48 hours post procedure Jardiance-AM of procedure Losartan-AM of procedure  Except hold medications AM meds can be  taken pre-cath with sips of water including: ASA 81 mg   Confirmed patient has responsible adult to drive home post procedure and be with patient first 24 hours after arriving home: yes  You are allowed ONE visitor in the waiting room during the time you are at the hospital for your procedure. Both you and your visitor must wear a mask once you enter the hospital.  Reviewed procedure/mask/visitor instructions with patient's daughter (DPR), [name].

## 2020-05-27 ENCOUNTER — Telehealth: Payer: Self-pay | Admitting: *Deleted

## 2020-05-27 ENCOUNTER — Telehealth: Payer: Self-pay | Admitting: Pharmacist Clinician (PhC)/ Clinical Pharmacy Specialist

## 2020-05-27 ENCOUNTER — Other Ambulatory Visit: Payer: Self-pay

## 2020-05-27 DIAGNOSIS — I2510 Atherosclerotic heart disease of native coronary artery without angina pectoris: Secondary | ICD-10-CM

## 2020-05-27 DIAGNOSIS — D151 Benign neoplasm of heart: Secondary | ICD-10-CM

## 2020-05-27 DIAGNOSIS — I96 Gangrene, not elsewhere classified: Secondary | ICD-10-CM | POA: Insufficient documentation

## 2020-05-27 DIAGNOSIS — R899 Unspecified abnormal finding in specimens from other organs, systems and tissues: Secondary | ICD-10-CM

## 2020-05-27 HISTORY — DX: Gangrene, not elsewhere classified: I96

## 2020-05-27 LAB — CBC
Hematocrit: 29.6 % — ABNORMAL LOW (ref 34.0–46.6)
Hemoglobin: 9.8 g/dL — ABNORMAL LOW (ref 11.1–15.9)
MCH: 33.1 pg — ABNORMAL HIGH (ref 26.6–33.0)
MCHC: 33.1 g/dL (ref 31.5–35.7)
MCV: 100 fL — ABNORMAL HIGH (ref 79–97)
Platelets: 286 10*3/uL (ref 150–450)
RBC: 2.96 x10E6/uL — ABNORMAL LOW (ref 3.77–5.28)
RDW: 14.1 % (ref 11.7–15.4)
WBC: 10.9 10*3/uL — ABNORMAL HIGH (ref 3.4–10.8)

## 2020-05-27 LAB — BASIC METABOLIC PANEL
BUN/Creatinine Ratio: 23 (ref 12–28)
BUN: 75 mg/dL — ABNORMAL HIGH (ref 8–27)
CO2: 15 mmol/L — ABNORMAL LOW (ref 20–29)
Calcium: 10 mg/dL (ref 8.7–10.3)
Chloride: 106 mmol/L (ref 96–106)
Creatinine, Ser: 3.31 mg/dL — ABNORMAL HIGH (ref 0.57–1.00)
GFR calc Af Amer: 16 mL/min/{1.73_m2} — ABNORMAL LOW (ref >59)
GFR calc non Af Amer: 14 mL/min/{1.73_m2} — ABNORMAL LOW (ref >59)
Glucose: 99 mg/dL (ref 65–99)
Potassium: 7.4 mmol/L (ref 3.5–5.2)
Sodium: 139 mmol/L (ref 134–144)

## 2020-05-27 NOTE — Telephone Encounter (Signed)
[facility] called in critical potassium : 7.4.    Messaged Dr. Ellyn Hack, who would like patient to go to ED for evaluation

## 2020-05-27 NOTE — Telephone Encounter (Signed)
Pt and daughter updated with lab results along with MD's recommendations. Pt and daughter verbalized understanding and state they will report to [facility] for further evaluations.

## 2020-05-27 NOTE — Telephone Encounter (Addendum)
05/26/20 lab results Cr 3.31/GFR 14/Hgb 9.8 -Potassium result incomplete at time of this note  05/27/20-per Dr.Harding - cancel her cath and TEE                 .She will need repeat labs on Mon 12/20.She will need a Nephrology consult.She needs to see PCP, hold losartan.  Lab results and Dr Allison Quarry recommendations discussed with patient's daughter (DPR), [name]. Katheryn aware patient does not need to hold Eliquis starting 05/29/20 since TEE and [facility] 05/30/20 have been cancelled.  Plan reviewed with Katheryn: -TEE and [facility] 05/30/20 cancelled -hold losartan until further notice -repeat BMP/CBC  05/30/20 at [facility] lab -will forward copy of 05/26/20 BMP/CBC to Dr Gala Lewandowsky her PCP -pt will call Dr Spero Curb today to discuss labs and referral to nephrology  Katheryn aware I will plan to follow-up with her first of week after 05/30/20 BMP/CBC results are available.

## 2020-05-30 ENCOUNTER — Ambulatory Visit (HOSPITAL_COMMUNITY): Admission: RE | Admit: 2020-05-30 | Payer: Medicare Other | Source: Home / Self Care | Admitting: Cardiology

## 2020-05-30 ENCOUNTER — Encounter (HOSPITAL_COMMUNITY): Admission: RE | Payer: Self-pay | Source: Home / Self Care

## 2020-05-30 SURGERY — RIGHT/LEFT HEART CATH AND CORONARY ANGIOGRAPHY
Anesthesia: LOCAL

## 2020-05-30 SURGERY — ECHOCARDIOGRAM, TRANSESOPHAGEAL
Anesthesia: Monitor Anesthesia Care

## 2020-05-31 HISTORY — PX: TRANSESOPHAGEAL ECHOCARDIOGRAM: SHX273

## 2020-06-01 DIAGNOSIS — I2510 Atherosclerotic heart disease of native coronary artery without angina pectoris: Secondary | ICD-10-CM

## 2020-06-01 DIAGNOSIS — I25110 Atherosclerotic heart disease of native coronary artery with unstable angina pectoris: Secondary | ICD-10-CM

## 2020-06-01 DIAGNOSIS — N183 Chronic kidney disease, stage 3 unspecified: Secondary | ICD-10-CM | POA: Insufficient documentation

## 2020-06-01 DIAGNOSIS — E785 Hyperlipidemia, unspecified: Secondary | ICD-10-CM | POA: Insufficient documentation

## 2020-06-01 HISTORY — DX: Atherosclerotic heart disease of native coronary artery with unstable angina pectoris: I25.110

## 2020-06-01 HISTORY — DX: Atherosclerotic heart disease of native coronary artery without angina pectoris: I25.10

## 2020-06-02 DIAGNOSIS — Z951 Presence of aortocoronary bypass graft: Secondary | ICD-10-CM | POA: Insufficient documentation

## 2020-06-02 HISTORY — PX: INTRAOPERATIVE TRANSESOPHAGEAL ECHOCARDIOGRAM: SHX5062

## 2020-06-02 HISTORY — PX: CORONARY ARTERY BYPASS GRAFT: SHX141

## 2020-06-02 HISTORY — PX: OTHER SURGICAL HISTORY: SHX169

## 2020-06-05 DIAGNOSIS — L03039 Cellulitis of unspecified toe: Secondary | ICD-10-CM | POA: Insufficient documentation

## 2020-06-11 DIAGNOSIS — Z992 Dependence on renal dialysis: Secondary | ICD-10-CM

## 2020-06-11 DIAGNOSIS — N186 End stage renal disease: Secondary | ICD-10-CM

## 2020-06-11 HISTORY — DX: Dependence on renal dialysis: N18.6

## 2020-06-11 HISTORY — DX: Dependence on renal dialysis: Z99.2

## 2020-06-20 ENCOUNTER — Encounter: Payer: Medicare Other | Admitting: Thoracic Surgery (Cardiothoracic Vascular Surgery)

## 2020-06-30 ENCOUNTER — Other Ambulatory Visit: Payer: Self-pay

## 2020-06-30 ENCOUNTER — Inpatient Hospital Stay (HOSPITAL_COMMUNITY)
Admission: EM | Admit: 2020-06-30 | Discharge: 2020-07-15 | DRG: 674 | Disposition: A | Discharge status: Home Health | Payer: Medicare Other | Attending: Family Medicine | Admitting: Family Medicine

## 2020-06-30 ENCOUNTER — Encounter (HOSPITAL_COMMUNITY): Payer: Self-pay | Admitting: Student

## 2020-06-30 ENCOUNTER — Encounter: Payer: Self-pay | Admitting: Vascular Surgery

## 2020-06-30 DIAGNOSIS — D638 Anemia in other chronic diseases classified elsewhere: Secondary | ICD-10-CM | POA: Diagnosis present

## 2020-06-30 DIAGNOSIS — E1152 Type 2 diabetes mellitus with diabetic peripheral angiopathy with gangrene: Secondary | ICD-10-CM | POA: Diagnosis present

## 2020-06-30 DIAGNOSIS — I10 Essential (primary) hypertension: Secondary | ICD-10-CM | POA: Diagnosis present

## 2020-06-30 DIAGNOSIS — D631 Anemia in chronic kidney disease: Secondary | ICD-10-CM | POA: Diagnosis present

## 2020-06-30 DIAGNOSIS — Z79899 Other long term (current) drug therapy: Secondary | ICD-10-CM

## 2020-06-30 DIAGNOSIS — E1129 Type 2 diabetes mellitus with other diabetic kidney complication: Secondary | ICD-10-CM | POA: Diagnosis present

## 2020-06-30 DIAGNOSIS — E875 Hyperkalemia: Secondary | ICD-10-CM | POA: Diagnosis not present

## 2020-06-30 DIAGNOSIS — I120 Hypertensive chronic kidney disease with stage 5 chronic kidney disease or end stage renal disease: Secondary | ICD-10-CM | POA: Diagnosis present

## 2020-06-30 DIAGNOSIS — I2510 Atherosclerotic heart disease of native coronary artery without angina pectoris: Secondary | ICD-10-CM | POA: Diagnosis present

## 2020-06-30 DIAGNOSIS — Z8249 Family history of ischemic heart disease and other diseases of the circulatory system: Secondary | ICD-10-CM

## 2020-06-30 DIAGNOSIS — I714 Abdominal aortic aneurysm, without rupture, unspecified: Secondary | ICD-10-CM | POA: Diagnosis present

## 2020-06-30 DIAGNOSIS — I480 Paroxysmal atrial fibrillation: Secondary | ICD-10-CM | POA: Diagnosis present

## 2020-06-30 DIAGNOSIS — E785 Hyperlipidemia, unspecified: Secondary | ICD-10-CM | POA: Diagnosis present

## 2020-06-30 DIAGNOSIS — Z833 Family history of diabetes mellitus: Secondary | ICD-10-CM

## 2020-06-30 DIAGNOSIS — E1122 Type 2 diabetes mellitus with diabetic chronic kidney disease: Secondary | ICD-10-CM | POA: Diagnosis present

## 2020-06-30 DIAGNOSIS — J439 Emphysema, unspecified: Secondary | ICD-10-CM | POA: Diagnosis present

## 2020-06-30 DIAGNOSIS — Z20822 Contact with and (suspected) exposure to covid-19: Secondary | ICD-10-CM | POA: Diagnosis present

## 2020-06-30 DIAGNOSIS — E872 Acidosis: Secondary | ICD-10-CM | POA: Diagnosis present

## 2020-06-30 DIAGNOSIS — D539 Nutritional anemia, unspecified: Secondary | ICD-10-CM | POA: Diagnosis present

## 2020-06-30 DIAGNOSIS — N179 Acute kidney failure, unspecified: Secondary | ICD-10-CM | POA: Diagnosis not present

## 2020-06-30 DIAGNOSIS — D649 Anemia, unspecified: Secondary | ICD-10-CM | POA: Diagnosis present

## 2020-06-30 DIAGNOSIS — Z9841 Cataract extraction status, right eye: Secondary | ICD-10-CM

## 2020-06-30 DIAGNOSIS — Z992 Dependence on renal dialysis: Secondary | ICD-10-CM | POA: Diagnosis present

## 2020-06-30 DIAGNOSIS — I700 Atherosclerosis of aorta: Secondary | ICD-10-CM | POA: Diagnosis present

## 2020-06-30 DIAGNOSIS — Z7984 Long term (current) use of oral hypoglycemic drugs: Secondary | ICD-10-CM

## 2020-06-30 DIAGNOSIS — Z951 Presence of aortocoronary bypass graft: Secondary | ICD-10-CM

## 2020-06-30 DIAGNOSIS — R432 Parageusia: Secondary | ICD-10-CM | POA: Diagnosis present

## 2020-06-30 DIAGNOSIS — Z7901 Long term (current) use of anticoagulants: Secondary | ICD-10-CM

## 2020-06-30 DIAGNOSIS — Z83438 Family history of other disorder of lipoprotein metabolism and other lipidemia: Secondary | ICD-10-CM

## 2020-06-30 DIAGNOSIS — N186 End stage renal disease: Secondary | ICD-10-CM | POA: Diagnosis present

## 2020-06-30 DIAGNOSIS — E11649 Type 2 diabetes mellitus with hypoglycemia without coma: Secondary | ICD-10-CM | POA: Diagnosis not present

## 2020-06-30 DIAGNOSIS — D72829 Elevated white blood cell count, unspecified: Secondary | ICD-10-CM | POA: Diagnosis present

## 2020-06-30 DIAGNOSIS — I70263 Atherosclerosis of native arteries of extremities with gangrene, bilateral legs: Secondary | ICD-10-CM | POA: Diagnosis present

## 2020-06-30 DIAGNOSIS — Z9842 Cataract extraction status, left eye: Secondary | ICD-10-CM

## 2020-06-30 DIAGNOSIS — D7589 Other specified diseases of blood and blood-forming organs: Secondary | ICD-10-CM | POA: Diagnosis present

## 2020-06-30 DIAGNOSIS — R1110 Vomiting, unspecified: Secondary | ICD-10-CM

## 2020-06-30 DIAGNOSIS — Z880 Allergy status to penicillin: Secondary | ICD-10-CM

## 2020-06-30 DIAGNOSIS — D696 Thrombocytopenia, unspecified: Secondary | ICD-10-CM | POA: Diagnosis not present

## 2020-06-30 DIAGNOSIS — D151 Benign neoplasm of heart: Secondary | ICD-10-CM | POA: Diagnosis present

## 2020-06-30 DIAGNOSIS — F17210 Nicotine dependence, cigarettes, uncomplicated: Secondary | ICD-10-CM | POA: Diagnosis present

## 2020-06-30 LAB — COMPREHENSIVE METABOLIC PANEL
ALT: 22 U/L (ref 0–44)
AST: 12 U/L — ABNORMAL LOW (ref 15–41)
Albumin: 3.1 g/dL — ABNORMAL LOW (ref 3.5–5.0)
Alkaline Phosphatase: 170 U/L — ABNORMAL HIGH (ref 38–126)
Anion gap: 15 (ref 5–15)
BUN: 87 mg/dL — ABNORMAL HIGH (ref 8–23)
CO2: 18 mmol/L — ABNORMAL LOW (ref 22–32)
Calcium: 9.2 mg/dL (ref 8.9–10.3)
Chloride: 99 mmol/L (ref 98–111)
Creatinine, Ser: 6.34 mg/dL — ABNORMAL HIGH (ref 0.44–1.00)
GFR, Estimated: 7 mL/min — ABNORMAL LOW (ref >60)
Glucose, Bld: 218 mg/dL — ABNORMAL HIGH (ref 70–99)
Potassium: 5.8 mmol/L — ABNORMAL HIGH (ref 3.5–5.1)
Sodium: 132 mmol/L — ABNORMAL LOW (ref 135–145)
Total Bilirubin: 0.8 mg/dL (ref 0.3–1.2)
Total Protein: 6.9 g/dL (ref 6.5–8.1)

## 2020-06-30 LAB — CBC
HCT: 30.2 % — ABNORMAL LOW (ref 36.0–46.0)
Hemoglobin: 9.8 g/dL — ABNORMAL LOW (ref 12.0–15.0)
MCH: 32.6 pg (ref 26.0–34.0)
MCHC: 32.5 g/dL (ref 30.0–36.0)
MCV: 100.3 fL — ABNORMAL HIGH (ref 80.0–100.0)
Platelets: 277 10*3/uL (ref 150–400)
RBC: 3.01 MIL/uL — ABNORMAL LOW (ref 3.87–5.11)
RDW: 15.4 % (ref 11.5–15.5)
WBC: 13 10*3/uL — ABNORMAL HIGH (ref 4.0–10.5)
nRBC: 0 % (ref 0.0–0.2)

## 2020-06-30 LAB — LIPASE, BLOOD: Lipase: 32 U/L (ref 11–51)

## 2020-06-30 MED ORDER — SODIUM ZIRCONIUM CYCLOSILICATE 10 G PO PACK
10.0000 g | PACK | Freq: Once | ORAL | Status: AC
  Administered 2020-07-01: 10 g via ORAL
  Filled 2020-06-30: qty 1

## 2020-06-30 MED ORDER — SODIUM CHLORIDE 0.9 % IV BOLUS
1000.0000 mL | Freq: Once | INTRAVENOUS | Status: AC
  Administered 2020-07-01: 1000 mL via INTRAVENOUS

## 2020-06-30 NOTE — ED Triage Notes (Signed)
Pt reports she is here today due to abnormal labs . Pt states she was sent here from her PCP. Pt is unable to tell me what labs was abnormal.

## 2020-06-30 NOTE — ED Provider Notes (Signed)
[place] EMERGENCY DEPARTMENT Provider Note   CSN: [id number] Arrival date & time: 06/30/20  1355     History Chief Complaint  Patient presents with  . Nausea    Kathryn Hancock is a 70 y.o. female with a hx of left atrial myxoma S/p surgical resection, hypertension, hyperlipidemia, CKD stage 3, & AAA who presents to the ED due to abnormal outpatient labs by PCP yesterday. Patient reports he has felt poorly over the past 1 week with minimal appetite, nausea, and intermittent vomiting.  She is not able to keep anything down.  No relieving or aggravating factors to her symptoms.  She was seen by her PCP yesterday who did blood work and told her kidneys were not doing well and that she needed to come to the emergency department.  She states she is urinating normally.  She denies fever, chills, abdominal pain, hematemesis, melena, hematochezia, diarrhea, constipation, chest pain, dyspnea, or syncope.    HPI     Past Medical History:  Diagnosis Date  . AAA (abdominal aortic aneurysm) ([place]) 06/2019   Multiple small pseudoaneurysmal projections of the dominant aorta.  Distal abdominal aortic aneurysm 4.5 x 4.7 cm.  Greatest AP dimension of the infrarenal aorta is 4.9.  No evidence of thoracic aortic aneurysm or dissection.  Brief segment of proximal IMA occlusion.  . CKD (chronic kidney disease) stage 3a, GFR 30-59 ml/min   . Coronary artery calcification seen on CAT scan 06/2019  . Diabetes mellitus without complication (HCC)    Type 2  . Heavy smoker (more than 20 cigarettes per day)    ~ 2 ppd; since age 80 (23 pk yr)  . Hyperlipidemia    Mixed  . Hypertension   . 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 -> TTE 07/24/19: Large size ill-defined echodensity in the left atrium suspicious for thrombus versus tumor.  Moderately dilated LA.--> CMR: Large mobile mass in the  LA attached to the IAS-does not appear to be thrombus, characteristics consistent with LEFT ATRIAL MYXOMA  . Microscopic hematuria   . Plantar wart of right foot     Patient Active Problem List   Diagnosis Date Noted  . 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 Tobacco abuse 12/10/2019  . Coronary artery calcification seen on CAT scan 12/10/2019  . AAA (abdominal aortic aneurysm) without rupture ([place]) 12/10/2019    Past Surgical History:  Procedure Laterality Date  . Cardiac MRI  01/22/2020   Normal LV size and function.  Moderate focal basal septal hypertrophy.  No S.A.M.  LVEF 61%.  RVEF 66%.  Large mobile mass in the left atrium attached to the interatrial septum-does not appear to thrombus characteristics consistent with left atrial myxoma.  . Chest CTA  06/2019   Multiple small pseudoaneurysmal projections of the dominant aorta.  Distal abdominal aortic aneurysm 4.5 x 4.7 cm.  Greatest AP dimension of the infrarenal aorta is 4.9.  No evidence of thoracic aortic aneurysm or dissection.  Brief segment of proximal IMA occlusion.  Large geographic filling defect of the left atrium with associated dense radiopaque material.  Aortic atherosclerosis and emphysema  . COLONOSCOPY W/ POLYPECTOMY    . EYE SURGERY Bilateral    Cataract surgery  . TRANSTHORACIC ECHOCARDIOGRAM  07/2019    EF 60 to 65%.  GR 1 DD.  Elevated LVEDP.  Large size ill-defined echodensity in the left atrium suspicious for thrombus versus tumor.  Moderately dilated LA.  . TUBAL LIGATION       OB History   No obstetric history on file.     Family History  Problem Relation Age of Onset  . Diabetes Mother   . Diabetes Father   . Heart attack Father 56  . CAD Father   . Hyperlipidemia Father   . Hypertension Father     Social History   Tobacco Use  . Smoking status: Current Every Day Smoker    Packs/day: 2.00    Years: 50.00    Pack years: 100.00    Types: Cigarettes  . Smokeless tobacco: Never Used  Vaping Use   . Vaping Use: Never used  Substance Use Topics  . Alcohol use: Not Currently  . Drug use: Never    Home Medications Prior to Admission medications   Medication Sig Start Date End Date Taking? Authorizing Provider  acetaminophen (TYLENOL) 500 MG tablet Take 500-1,000 mg by mouth every 6 (six) hours as needed (for pain.).    [provider]  apixaban (ELIQUIS) 5 MG TABS tablet Take 1 tablet (5 mg total) by mouth 2 (two) times daily. 12/10/19   Leonie Man, MD  Ascorbic Acid (VITAMIN C) 1000 MG tablet Take 1,000 mg by mouth every evening.    [provider]  atorvastatin (LIPITOR) 20 MG tablet Take 20 mg by mouth daily.    [provider]  empagliflozin (JARDIANCE) 25 MG TABS tablet Take 25 mg by mouth daily.    [provider]  Exenatide ER (BYDUREON) 2 MG PEN Inject 2 mg into the skin every Sunday.    [provider]  fluticasone (FLONASE) 50 MCG/ACT nasal spray Place 2 sprays into both nostrils as needed for allergies or rhinitis.    [provider]  glucose blood test strip 1 each by Other route as needed. Use as instructed    [provider]  losartan (COZAAR) 100 MG tablet Take 100 mg by mouth daily. Patient not taking: Reported on 05/27/2020    [provider]  metFORMIN (GLUCOPHAGE) 1000 MG tablet Take 1,000 mg by mouth 2 (two) times daily with a meal.    [provider]  montelukast (SINGULAIR) 10 MG tablet Take 10 mg by mouth daily as needed (allergies.).    [provider]  Vitamin D3 (VITAMIN D) 25 MCG tablet Take 2,000 Units by mouth every evening.    [provider]    Allergies    Penicillins  Review of Systems   Review of Systems  Constitutional: Positive for appetite change. Negative for chills and fever.  Respiratory: Negative for shortness of breath.   Cardiovascular: Negative for chest pain.  Gastrointestinal: Positive for nausea and vomiting. Negative for abdominal  pain, anal bleeding, blood in stool, constipation and diarrhea.  Genitourinary: Negative for difficulty urinating and dysuria.  Neurological: Negative for syncope.  All other systems reviewed and are negative.   Physical Exam Updated Vital Signs BP (!) 130/97 (BP Location: Right Arm)   Pulse 99   Temp 98.1 F (36.7 C) (Oral)   Resp 18   Ht 5\' 3"  (1.6 m)   Wt 61.2 kg   SpO2 100%   BMI 23.91 kg/m   Physical Exam Vitals and nursing note reviewed.  Constitutional:      General: She is not in acute distress.    Appearance: She is well-developed. She is not toxic-appearing.  HENT:     Head: Normocephalic and atraumatic.  Mouth/Throat:     Mouth: Mucous membranes are dry.  Eyes:     General:        Right eye: No discharge.        Left eye: No discharge.     Conjunctiva/sclera: Conjunctivae normal.  Cardiovascular:     Rate and Rhythm: Tachycardia present. Rhythm irregular.  Pulmonary:     Effort: Pulmonary effort is normal. No respiratory distress.     Breath sounds: Normal breath sounds. No wheezing, rhonchi or rales.  Chest:     Comments: Central chest sternotomy incision site with staples in place.  Appears to be healing appropriately.  No erythema, purulent drainage, or dehiscence noted. Abdominal:     General: There is no distension.     Palpations: Abdomen is soft.     Tenderness: There is no abdominal tenderness. There is no guarding or rebound.  Musculoskeletal:     Cervical back: Neck supple.     Comments: No significant peripheral edema.  Skin:    General: Skin is warm and dry.     Findings: No rash.  Neurological:     Mental Status: She is alert.     Comments: Clear speech.   Psychiatric:        Behavior: Behavior normal.     ED Results / Procedures / Treatments   Labs (all labs ordered are listed, but only abnormal results are displayed) Labs Reviewed  CBC - Abnormal; Notable for the following components:      Result Value   WBC 13.0 (*)    RBC  3.01 (*)    Hemoglobin 9.8 (*)    HCT 30.2 (*)    MCV 100.3 (*)    All other components within normal limits  COMPREHENSIVE METABOLIC PANEL - Abnormal; Notable for the following components:   Sodium 132 (*)    Potassium 5.8 (*)    CO2 18 (*)    Glucose, Bld 218 (*)    BUN 87 (*)    Creatinine, Ser 6.34 (*)    Albumin 3.1 (*)    AST 12 (*)    Alkaline Phosphatase 170 (*)    GFR, Estimated 7 (*)    All other components within normal limits  LIPASE, BLOOD  URINALYSIS, ROUTINE W REFLEX MICROSCOPIC    EKG EKG Interpretation  Date/Time:  Thursday June 30 2020 23:32:10 EST Ventricular Rate:  89 PR Interval:    QRS Duration: 97 QT Interval:  408 QTC Calculation: 497 R Axis:   84 Text Interpretation: Atrial fibrillation Borderline right axis deviation Minimal ST depression, lateral leads Borderline prolonged QT interval No previous tracing Confirmed by Orpah Greek [phone]) on 06/30/2020 11:34:31 PM   Radiology No results found.  Procedures Procedures (including critical care time)  Medications Ordered in ED Medications  sodium chloride 0.9 % bolus 1,000 mL (1,000 mLs Intravenous New Bag/Given 07/01/20 0024)  sodium zirconium cyclosilicate (LOKELMA) packet 10 g (10 g Oral Given 07/01/20 0025)    ED Course  I have reviewed the triage vital signs and the nursing notes.  Pertinent labs & imaging results that were available during my care of the patient were reviewed by me and considered in my medical decision making (see chart for details).    MDM Rules/Calculators/A&P                         Patient presents to the ED with complaints of abnormal outpatient labs in the setting of  nausea and vomiting over the past 1 week.  Patient is nontoxic, resting comfortably, she is tachycardic mildly on exam, mucous membranes are dry, and abdomen is nontender without peritoneal signs.  Additional history obtained:  Additional history obtained from chart review & nursing note  review:  Patient with recent admission to [facility] due to having a left atrial myxoma which was resected during hospitalization, she was noted to have an acute kidney injury at that time with creatinine maximum of 4.1.  Was ultimately discharged home, most recently followed up with nephrology 06/15/2020, creatinine at that time 4.0.  Felt to not need urgent dialysis.   Lab Tests:  I Ordered, reviewed, and interpreted labs, which included:  CBC: Anemia similar to prior. CMP: Acute kidney injury with creatinine 6.34 and BUN of 87, she is also hyperkalemic at 5.8. Lipase: Within normal limits  EKG: Patient appears to be in A. fib, T waves are not significantly peaked  23:55: CONSULT: Discussed with nephrologist Dr. Vilinda Flake it is reasonable to hydrate patient with 1 L normal saline then continuous fluids as well as to give [facility] for hyperkalemia with plan to trend her renal function & UOP.   1 L of normal saline ordered as well as 10 g of Lokelma per discussion with nephrology.  Will consult hospitalist service for admission.  Patient is in agreement.  00:56: CONSULT: Discussed with hospitliast Dr. Hal Hope- accepts admission.   Findings and plan of care discussed with supervising physician Dr. Betsey Holiday who is in agreement.   Portions of this note were generated with [employer]. Dictation errors may occur despite best attempts at proofreading.  Final Clinical Impression(s) / ED Diagnoses Final diagnoses:  AKI (acute kidney injury) ([place])  Hyperkalemia    Rx / DC Orders ED Discharge Orders    None       Amaryllis Dyke, PA-C 07/01/20 0133    Orpah Greek, MD 07/01/20 [phone]

## 2020-07-01 ENCOUNTER — Inpatient Hospital Stay (HOSPITAL_COMMUNITY): Payer: Medicare Other

## 2020-07-01 ENCOUNTER — Encounter (HOSPITAL_COMMUNITY): Payer: Self-pay | Admitting: Internal Medicine

## 2020-07-01 DIAGNOSIS — E1129 Type 2 diabetes mellitus with other diabetic kidney complication: Secondary | ICD-10-CM | POA: Diagnosis not present

## 2020-07-01 DIAGNOSIS — I10 Essential (primary) hypertension: Secondary | ICD-10-CM | POA: Diagnosis not present

## 2020-07-01 DIAGNOSIS — Z83438 Family history of other disorder of lipoprotein metabolism and other lipidemia: Secondary | ICD-10-CM | POA: Diagnosis not present

## 2020-07-01 DIAGNOSIS — E1122 Type 2 diabetes mellitus with diabetic chronic kidney disease: Secondary | ICD-10-CM | POA: Diagnosis present

## 2020-07-01 DIAGNOSIS — D151 Benign neoplasm of heart: Secondary | ICD-10-CM

## 2020-07-01 DIAGNOSIS — E1152 Type 2 diabetes mellitus with diabetic peripheral angiopathy with gangrene: Secondary | ICD-10-CM | POA: Diagnosis present

## 2020-07-01 DIAGNOSIS — Z9841 Cataract extraction status, right eye: Secondary | ICD-10-CM | POA: Diagnosis not present

## 2020-07-01 DIAGNOSIS — I480 Paroxysmal atrial fibrillation: Secondary | ICD-10-CM | POA: Diagnosis present

## 2020-07-01 DIAGNOSIS — J439 Emphysema, unspecified: Secondary | ICD-10-CM | POA: Diagnosis present

## 2020-07-01 DIAGNOSIS — I120 Hypertensive chronic kidney disease with stage 5 chronic kidney disease or end stage renal disease: Secondary | ICD-10-CM | POA: Diagnosis present

## 2020-07-01 DIAGNOSIS — I998 Other disorder of circulatory system: Secondary | ICD-10-CM | POA: Diagnosis not present

## 2020-07-01 DIAGNOSIS — E785 Hyperlipidemia, unspecified: Secondary | ICD-10-CM | POA: Diagnosis present

## 2020-07-01 DIAGNOSIS — I739 Peripheral vascular disease, unspecified: Secondary | ICD-10-CM

## 2020-07-01 DIAGNOSIS — N186 End stage renal disease: Secondary | ICD-10-CM | POA: Diagnosis present

## 2020-07-01 DIAGNOSIS — I70263 Atherosclerosis of native arteries of extremities with gangrene, bilateral legs: Secondary | ICD-10-CM | POA: Diagnosis present

## 2020-07-01 DIAGNOSIS — E872 Acidosis: Secondary | ICD-10-CM | POA: Diagnosis present

## 2020-07-01 DIAGNOSIS — Z9842 Cataract extraction status, left eye: Secondary | ICD-10-CM | POA: Diagnosis not present

## 2020-07-01 DIAGNOSIS — Z20822 Contact with and (suspected) exposure to covid-19: Secondary | ICD-10-CM | POA: Diagnosis present

## 2020-07-01 DIAGNOSIS — I714 Abdominal aortic aneurysm, without rupture: Secondary | ICD-10-CM | POA: Diagnosis present

## 2020-07-01 DIAGNOSIS — I96 Gangrene, not elsewhere classified: Secondary | ICD-10-CM

## 2020-07-01 DIAGNOSIS — F17210 Nicotine dependence, cigarettes, uncomplicated: Secondary | ICD-10-CM | POA: Diagnosis present

## 2020-07-01 DIAGNOSIS — D631 Anemia in chronic kidney disease: Secondary | ICD-10-CM | POA: Diagnosis present

## 2020-07-01 DIAGNOSIS — I70261 Atherosclerosis of native arteries of extremities with gangrene, right leg: Secondary | ICD-10-CM | POA: Diagnosis not present

## 2020-07-01 DIAGNOSIS — Z880 Allergy status to penicillin: Secondary | ICD-10-CM | POA: Diagnosis not present

## 2020-07-01 DIAGNOSIS — Z7984 Long term (current) use of oral hypoglycemic drugs: Secondary | ICD-10-CM | POA: Diagnosis not present

## 2020-07-01 DIAGNOSIS — Z79899 Other long term (current) drug therapy: Secondary | ICD-10-CM | POA: Diagnosis not present

## 2020-07-01 DIAGNOSIS — Z833 Family history of diabetes mellitus: Secondary | ICD-10-CM | POA: Diagnosis not present

## 2020-07-01 DIAGNOSIS — N185 Chronic kidney disease, stage 5: Secondary | ICD-10-CM | POA: Diagnosis not present

## 2020-07-01 DIAGNOSIS — I700 Atherosclerosis of aorta: Secondary | ICD-10-CM | POA: Diagnosis present

## 2020-07-01 DIAGNOSIS — N183 Chronic kidney disease, stage 3 unspecified: Secondary | ICD-10-CM | POA: Diagnosis not present

## 2020-07-01 DIAGNOSIS — N179 Acute kidney failure, unspecified: Secondary | ICD-10-CM | POA: Diagnosis present

## 2020-07-01 DIAGNOSIS — Z8249 Family history of ischemic heart disease and other diseases of the circulatory system: Secondary | ICD-10-CM | POA: Diagnosis not present

## 2020-07-01 DIAGNOSIS — D638 Anemia in other chronic diseases classified elsewhere: Secondary | ICD-10-CM | POA: Diagnosis present

## 2020-07-01 DIAGNOSIS — E875 Hyperkalemia: Secondary | ICD-10-CM | POA: Diagnosis present

## 2020-07-01 DIAGNOSIS — Z7901 Long term (current) use of anticoagulants: Secondary | ICD-10-CM | POA: Diagnosis not present

## 2020-07-01 DIAGNOSIS — I482 Chronic atrial fibrillation, unspecified: Secondary | ICD-10-CM | POA: Diagnosis not present

## 2020-07-01 DIAGNOSIS — Z992 Dependence on renal dialysis: Secondary | ICD-10-CM | POA: Diagnosis present

## 2020-07-01 HISTORY — DX: Type 2 diabetes mellitus with other diabetic kidney complication: E11.29

## 2020-07-01 HISTORY — DX: Paroxysmal atrial fibrillation: I48.0

## 2020-07-01 LAB — HEMOGLOBIN A1C
Hgb A1c MFr Bld: 5.6 % (ref 4.8–5.6)
Mean Plasma Glucose: 114.02 mg/dL

## 2020-07-01 LAB — CBC
HCT: 29.2 % — ABNORMAL LOW (ref 36.0–46.0)
Hemoglobin: 9.2 g/dL — ABNORMAL LOW (ref 12.0–15.0)
MCH: 31.9 pg (ref 26.0–34.0)
MCHC: 31.5 g/dL (ref 30.0–36.0)
MCV: 101.4 fL — ABNORMAL HIGH (ref 80.0–100.0)
Platelets: 207 10*3/uL (ref 150–400)
RBC: 2.88 MIL/uL — ABNORMAL LOW (ref 3.87–5.11)
RDW: 15 % (ref 11.5–15.5)
WBC: 9.6 10*3/uL (ref 4.0–10.5)
nRBC: 0 % (ref 0.0–0.2)

## 2020-07-01 LAB — HEPARIN LEVEL (UNFRACTIONATED): Heparin Unfractionated: >2.2 IU/mL — ABNORMAL HIGH (ref 0.30–0.70)

## 2020-07-01 LAB — COMPREHENSIVE METABOLIC PANEL
ALT: 19 U/L (ref 0–44)
AST: 13 U/L — ABNORMAL LOW (ref 15–41)
Albumin: 2.9 g/dL — ABNORMAL LOW (ref 3.5–5.0)
Alkaline Phosphatase: 144 U/L — ABNORMAL HIGH (ref 38–126)
Anion gap: 15 (ref 5–15)
BUN: 88 mg/dL — ABNORMAL HIGH (ref 8–23)
CO2: 15 mmol/L — ABNORMAL LOW (ref 22–32)
Calcium: 8.9 mg/dL (ref 8.9–10.3)
Chloride: 104 mmol/L (ref 98–111)
Creatinine, Ser: 6.24 mg/dL — ABNORMAL HIGH (ref 0.44–1.00)
GFR, Estimated: 7 mL/min — ABNORMAL LOW (ref >60)
Glucose, Bld: 71 mg/dL (ref 70–99)
Potassium: 4.9 mmol/L (ref 3.5–5.1)
Sodium: 134 mmol/L — ABNORMAL LOW (ref 135–145)
Total Bilirubin: 0.8 mg/dL (ref 0.3–1.2)
Total Protein: 6.3 g/dL — ABNORMAL LOW (ref 6.5–8.1)

## 2020-07-01 LAB — CBG MONITORING, ED
Glucose-Capillary: 62 mg/dL — ABNORMAL LOW (ref 70–99)
Glucose-Capillary: 190 mg/dL — ABNORMAL HIGH (ref 70–99)
Glucose-Capillary: 126 mg/dL — ABNORMAL HIGH (ref 70–99)

## 2020-07-01 LAB — APTT
aPTT: 54 seconds — ABNORMAL HIGH (ref 24–36)
aPTT: 85 seconds — ABNORMAL HIGH (ref 24–36)

## 2020-07-01 LAB — GLUCOSE, CAPILLARY
Glucose-Capillary: 115 mg/dL — ABNORMAL HIGH (ref 70–99)
Glucose-Capillary: 134 mg/dL — ABNORMAL HIGH (ref 70–99)

## 2020-07-01 LAB — HIV ANTIBODY (ROUTINE TESTING W REFLEX): HIV Screen 4th Generation wRfx: NONREACTIVE

## 2020-07-01 LAB — SARS CORONAVIRUS 2 (TAT 6-24 HRS): SARS Coronavirus 2: NEGATIVE

## 2020-07-01 MED ORDER — SODIUM CHLORIDE 0.9 % IV SOLN
INTRAVENOUS | Status: DC
  Administered 2020-07-01: 1000 mL via INTRAVENOUS

## 2020-07-01 MED ORDER — ASPIRIN EC 81 MG PO TBEC
81.0000 mg | DELAYED_RELEASE_TABLET | Freq: Every day | ORAL | Status: DC
  Administered 2020-07-01 – 2020-07-15 (×15): 81 mg via ORAL
  Filled 2020-07-01 (×15): qty 1

## 2020-07-01 MED ORDER — INSULIN ASPART 100 UNIT/ML ~~LOC~~ SOLN: 0.0000 [IU] | Freq: Three times a day (TID) | SUBCUTANEOUS | Status: DC

## 2020-07-01 MED ORDER — ACETAMINOPHEN 650 MG RE SUPP
650.0000 mg | Freq: Four times a day (QID) | RECTAL | Status: DC | PRN
Start: 2020-07-01 — End: 2020-07-08

## 2020-07-01 MED ORDER — CARVEDILOL 12.5 MG PO TABS
12.5000 mg | ORAL_TABLET | Freq: Two times a day (BID) | ORAL | Status: DC
  Administered 2020-07-01 – 2020-07-13 (×21): 12.5 mg via ORAL
  Filled 2020-07-01 (×24): qty 1

## 2020-07-01 MED ORDER — ATORVASTATIN CALCIUM 10 MG PO TABS
20.0000 mg | ORAL_TABLET | Freq: Every day | ORAL | Status: DC
  Administered 2020-07-01 – 2020-07-02 (×2): 20 mg via ORAL
  Filled 2020-07-01 (×2): qty 2

## 2020-07-01 MED ORDER — SODIUM BICARBONATE 650 MG PO TABS
1300.0000 mg | ORAL_TABLET | Freq: Two times a day (BID) | ORAL | Status: DC
  Administered 2020-07-01 – 2020-07-03 (×4): 1300 mg via ORAL
  Filled 2020-07-01 (×5): qty 2

## 2020-07-01 MED ORDER — DEXTROSE 50 % IV SOLN
INTRAVENOUS | Status: AC
  Filled 2020-07-01: qty 50

## 2020-07-01 MED ORDER — FOLIC ACID 1 MG PO TABS
1000.0000 ug | ORAL_TABLET | Freq: Every day | ORAL | Status: DC
  Administered 2020-07-01 – 2020-07-15 (×15): 1 mg via ORAL
  Filled 2020-07-01 (×15): qty 1

## 2020-07-01 MED ORDER — HEPARIN (PORCINE) 25000 UT/250ML-% IV SOLN
1000.0000 [IU]/h | INTRAVENOUS | Status: DC
  Administered 2020-07-01: 03:00:00 900 [IU]/h via INTRAVENOUS
  Administered 2020-07-03: 1000 [IU]/h via INTRAVENOUS
  Administered 2020-07-03: 1100 [IU]/h via INTRAVENOUS
  Administered 2020-07-04 – 2020-07-05 (×2): 1000 [IU]/h via INTRAVENOUS
  Filled 2020-07-01 (×7): qty 250

## 2020-07-01 MED ORDER — AMLODIPINE BESYLATE 5 MG PO TABS
5.0000 mg | ORAL_TABLET | Freq: Every day | ORAL | Status: DC
Start: 2020-07-01 — End: 2020-07-01
  Administered 2020-07-01: 5 mg via ORAL
  Filled 2020-07-01: qty 1

## 2020-07-01 MED ORDER — DEXTROSE 50 % IV SOLN
50.0000 mL | Freq: Once | INTRAVENOUS | Status: AC
  Administered 2020-07-01: 50 mL via INTRAVENOUS

## 2020-07-01 MED ORDER — ACETAMINOPHEN 325 MG PO TABS
650.0000 mg | ORAL_TABLET | Freq: Four times a day (QID) | ORAL | Status: DC | PRN
  Administered 2020-07-01: 650 mg via ORAL
  Filled 2020-07-01: qty 2

## 2020-07-01 MED ORDER — AMIODARONE HCL 200 MG PO TABS
200.0000 mg | ORAL_TABLET | Freq: Every day | ORAL | Status: DC
Start: 2020-07-01 — End: 2020-07-15
  Administered 2020-07-01 – 2020-07-15 (×15): 200 mg via ORAL
  Filled 2020-07-01 (×15): qty 1

## 2020-07-01 MED ORDER — OXYCODONE HCL 5 MG PO TABS
5.0000 mg | ORAL_TABLET | Freq: Four times a day (QID) | ORAL | Status: DC | PRN
  Administered 2020-07-01: 15:00:00 5 mg via ORAL
  Administered 2020-07-01: 10 mg via ORAL
  Administered 2020-07-02 – 2020-07-03 (×2): 5 mg via ORAL
  Administered 2020-07-03: 10 mg via ORAL
  Administered 2020-07-03 – 2020-07-08 (×13): 5 mg via ORAL
  Administered 2020-07-08: 10 mg via ORAL
  Administered 2020-07-09 (×3): 5 mg via ORAL
  Administered 2020-07-10 – 2020-07-12 (×4): 10 mg via ORAL
  Administered 2020-07-13 (×2): 5 mg via ORAL
  Administered 2020-07-14 (×2): 10 mg via ORAL
  Administered 2020-07-14: 5 mg via ORAL
  Administered 2020-07-15 (×2): 10 mg via ORAL
  Filled 2020-07-01: qty 1
  Filled 2020-07-01 (×2): qty 2
  Filled 2020-07-01: qty 1
  Filled 2020-07-01 (×6): qty 2
  Filled 2020-07-01 (×5): qty 1
  Filled 2020-07-01 (×2): qty 2
  Filled 2020-07-01 (×5): qty 1
  Filled 2020-07-01: qty 2
  Filled 2020-07-01 (×4): qty 1
  Filled 2020-07-01: qty 2
  Filled 2020-07-01 (×4): qty 1
  Filled 2020-07-01: qty 2

## 2020-07-01 NOTE — ED Notes (Signed)
Pt to vascular.

## 2020-07-01 NOTE — Progress Notes (Addendum)
PROGRESS NOTE    Kathryn Hancock  TIW:[id number] DOB: 02-Nov-1950 DOA: 06/30/2020 PCP: Gala Lewandowsky, MD   Brief Narrative:  Kathryn Hancock is a 70 y.o. female with history of left atrial myxoma, diabetes mellitus type 2, hypertension, chronic kidney disease stage III who had a left atrial myxoma resection in third week of December last month at [facility] during which patient had worsening renal function and lower extremity ischemic change and postoperative A. fib.  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  Was instructed to repeat and the repeat on showed worsening and was advised to come to the ER.  Patient states over the last 2 weeks patient has been worsening nausea vomiting unable to keep anything but denies any abdominal pain or diarrhea.  ED Course: In the ER patient is afebrile and not hypoxic.  Labs show creatinine of 6.3 hemoglobin 9.8 WBC 13 potassium 5.8.  EKG shows A. fib rate controlled.  ER physician discussed with on-call nephrologist Dr. Marval Regal who advised to give fluid for which patient was given fluid bolus and admitted for further observation.  COVID test is pending.   Assessment & Plan:  AKI on CKD stage IIIb: -Could be in the setting of atheroembolic disease as patient's kidney function are worsening after recent surgery for left atrial myxoma -IV fluid given in the ED.  CT renal study: Negative for hydronephrosis/obstruction. -Continue with gentle hydration.  Consulted nephrology -Avoid nephrotoxic medication. 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  Benign abdominal exam.  CT renal studies shows no  obstruction/hydronephrosis/pyelonephritis. -Continue Zofran as needed for nausea and vomiting.  Hyperkalemia:  -In the setting of worsening kidney function.   -Resolved after Lokelma  Paroxysmal A. fib: Rate control -Continue Coreg and amiodarone.  Hold Eliquis in the setting of worsening kidney function.  Patient started on heparin GTT as per pharmacy.  Hypertension: Stable.  Continue amlodipine and Coreg  Type 2 diabetes mellitus: Check A1c.  Patient had hypoglycemic episode this morning however she remained asymptomatic.  D50 IV once given.  Continue sliding scale insulin.  Monitor blood sugar closely.  Anemia of chronic disease:  -Macrocytosis.  H&H is currently stable.  Continue to monitor  Recent CABG for left atrial myxoma at [facility]: -In December 2021.  Patient denies an chest pain or shortness of breath.  Continue to monitor -Continue aspirin, statin  History of abdominal aortic aneurysm: Reviewed CT renal study showed unchanged from prior exam back to January 15/2021.  Leukocytosis: Resolved  Tobacco abuse: Smokes 1 pack of cigarettes per day-tells me that she is working on cessation.  Cuts back from 2 packs to 1 pack.  DVT prophylaxis: Heparin Code Status: Full code Family Communication:  None present at bedside.  Plan of care discussed with patient in length and she verbalized understanding and agreed with it. Disposition Plan: To be determined  Consultants:   Nephrology  Vascular surgery  Procedures:   CT renal study  Antimicrobials:   None  Status is: Inpatient  Dispo: The patient is from: Home              Anticipated d/c is to: Home              Anticipated d/c date is: 2 days  Patient currently is not medically stable to d/c.     Subjective: Patient seen and examined in ED.  Tells me that she feels better this morning.  No nausea and vomiting.  Continues to have pain in her feet which is chronic for her.  She denies fever  or chills, chest pain, shortness of breath, palpitation, leg swelling, orthopnea or PND.  She still has staples from her recent surgery on her chest.  She tells me that she could not follow-up for staples removal.  Objective: Vitals:   07/01/20 0130 07/01/20 0215 07/01/20 0400 07/01/20 0800  BP: 102/70 97/64 102/76 100/65  Pulse: 90 99 86 (!) 104  Resp: 14 12 (!) 23 15  Temp:    98.3 F (36.8 C)  TempSrc:    Oral  SpO2: 96% 97% 96% 98%  Weight:      Height:        Intake/Output Summary (Last 24 hours) at 07/01/2020 0934 Last data filed at 07/01/2020 0224 Gross per 24 hour  Intake 1000 ml  Output --  Net 1000 ml   Filed Weights   06/30/20 1454  Weight: 61.2 kg    Examination:  General exam: Appears calm and comfortable, on room air, communicating well Respiratory system: Clear to auscultation. Respiratory effort normal. Cardiovascular system: S1 & S2 heard, RRR. No JVD, murmurs, rubs, gallops or clicks. No pedal edema.  Midsternal staples noted.  No signs of active bleeding or discharge seen. Gastrointestinal system: Abdomen is nondistended, soft and nontender. No organomegaly or masses felt. Normal bowel sounds heard. Central nervous system: Alert and oriented. No focal neurological deficits. Extremities:        Psychiatry: Judgement and insight appear normal. Mood & affect appropriate.    Data Reviewed: I have personally reviewed following labs and imaging studies  CBC: Recent Labs  Lab 06/30/20 1508 07/01/20 0700  WBC 13.0* 9.6  HGB 9.8* 9.2*  HCT 30.2* 29.2*  MCV 100.3* 101.4*  PLT 277 572   Basic Metabolic Panel: Recent Labs  Lab 06/30/20 1508 07/01/20 0700  NA 132* 134*  K 5.8* 4.9  CL 99 104  CO2 18* 15*  GLUCOSE 218* 71  BUN 87* 88*  CREATININE 6.34* 6.24*  CALCIUM 9.2 8.9   GFR: Estimated Creatinine Clearance: 7.2 mL/min (A) (by C-G formula based on SCr of 6.24 mg/dL (H)). Liver Function Tests: Recent Labs  Lab 06/30/20 1508  07/01/20 0700  AST 12* 13*  ALT 22 19  ALKPHOS 170* 144*  BILITOT 0.8 0.8  PROT 6.9 6.3*  ALBUMIN 3.1* 2.9*   Recent Labs  Lab 06/30/20 1508  LIPASE 32   No results for input(s): AMMONIA in the last 168 hours. Coagulation Profile: No results for input(s): INR, PROTIME in the last 168 hours. Cardiac Enzymes: No results for input(s): CKTOTAL, CKMB, CKMBINDEX, TROPONINI in the last 168 hours. BNP (last 3 results) No results for input(s): PROBNP in the last 8760 hours. HbA1C: No results for input(s): HGBA1C in the last 72 hours. CBG: Recent Labs  Lab 07/01/20 0822  GLUCAP 62*   Lipid Profile: No results for input(s): CHOL, HDL, LDLCALC, TRIG, CHOLHDL, LDLDIRECT in the last 72 hours. Thyroid Function Tests: No results for input(s): TSH, T4TOTAL, FREET4, T3FREE, THYROIDAB in the last 72 hours. Anemia Panel: No results for input(s): VITAMINB12, FOLATE, FERRITIN, TIBC, IRON, RETICCTPCT in the last 72 hours. Sepsis Labs: No results for input(s): PROCALCITON, LATICACIDVEN in the last 168 hours.  No results found for this or  any previous visit (from the past 240 hour(s)).    Radiology Studies: DG CHEST PORT 1 VIEW  Result Date: 07/01/2020 CLINICAL DATA:  Vomiting EXAM: PORTABLE CHEST 1 VIEW COMPARISON:  June 26, 2019 FINDINGS: The patient is status post prior median sternotomy. The heart size is enlarged but stable. There is no pneumothorax. No large pleural effusion. Aortic calcifications are noted. There is no acute osseous abnormality. There is a rounded density projecting over the left lower lung zone. IMPRESSION: 1. No acute cardiopulmonary process. 2. Rounded density projecting over the left lower lung zone is likely a nipple shadow. However, a follow-up two-view chest x-ray in 4-6 weeks is recommended to confirm stability or resolution of this finding. Electronically Signed   By: Constance Holster M.D.   On: 07/01/2020 06:14   CT RENAL STONE STUDY  Result Date:  07/01/2020 CLINICAL DATA:  Flank pain EXAM: CT ABDOMEN AND PELVIS WITHOUT CONTRAST TECHNIQUE: Multidetector CT imaging of the abdomen and pelvis was performed following the standard protocol without IV contrast. COMPARISON:  None. FINDINGS: Lower chest: The visualized heart size within normal limits. No pericardial fluid/thickening. No hiatal hernia. There is patchy airspace consolidation seen at the posterior right lung base Hepatobiliary: Although limited due to the lack of intravenous contrast, normal in appearance without gross focal abnormality. Scattered calcified gallstones are present. There are 2 layering within the gallbladder fundus the largest measuring 9 mm. Pancreas:  Unremarkable.  No surrounding inflammatory changes. Spleen: Normal in size. Although limited due to the lack of intravenous contrast, normal in appearance. Adrenals/Urinary Tract: Both adrenal glands appear normal. There is a punctate nonobstructing renal calculi in the upper pole of the right kidney. No left-sided renal or collecting system calculi are noted. Bladder is unremarkable. Stomach/Bowel: The stomach, small bowel, and colon are normal in appearance. No inflammatory changes or obstructive findings. Scattered colonic diverticula are noted. Appendix is normal. Vascular/Lymphatic: There are no enlarged abdominal or pelvic lymph nodes. 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 The maximum dimension of the aorta with focal outpouching/pseudoaneurysm at the level of L3 measures 4.9 cm. The infrarenal abdominal aorta measures maximum diameter 4.7 cm which is also unchanged from the prior exam. There is scattered atherosclerosis seen throughout. Reproductive: For Other: No evidence of abdominal wall mass or hernia. Musculoskeletal: No acute or significant osseous findings. IMPRESSION: 1. Patchy airspace  consolidation at the posterior right lung base which could be due to atelectasis or infectious etiology. 2. Nonobstructing punctate right renal calculi. 3. Cholelithiasis 4. 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 This is unchanged from prior exam dating back to June 26, 2019. 5. Aortic Atherosclerosis (ICD10-I70.0). Electronically Signed   By: Prudencio Pair M.D.   On: 07/01/2020 02:05    Scheduled Meds: . amiodarone  200 mg Oral Daily  . amLODipine  5 mg Oral Daily  . aspirin EC  81 mg Oral Daily  . atorvastatin  20 mg Oral q1800  . carvedilol  12.5 mg Oral BID WC  . dextrose      . folic acid  4,403 mcg Oral Daily  . insulin aspart  0-6 Units Subcutaneous TID WC   Continuous Infusions: . heparin 900 Units/hr (07/01/20 0257)     LOS: 0 days   Time spent: 40 minutes   Kumar Falwell R  Vyncent Overby, MD [facility]  If 7PM-7AM, please contact night-coverage [email or web address] 07/01/2020, 9:34 AM

## 2020-07-01 NOTE — Progress Notes (Signed)
ANTICOAGULATION CONSULT NOTE - Initial Consult  Pharmacy Consult for heparin Indication: atrial fibrillation  Allergies  Allergen Reactions  . Penicillins Other (See Comments)    Childhood reaction.    Patient Measurements: Height: 5\' 3"  (160 cm) Weight: 61.2 kg (135 lb) IBW/kg (Calculated) : 52.4  Vital Signs: Temp: 98.1 F (36.7 C) (01/20 2254) Temp Source: Oral (01/20 2254) BP: 102/70 (01/21 0130) Pulse Rate: 90 (01/21 0130)  Labs: Recent Labs    06/30/20 1508  HGB 9.8*  HCT 30.2*  PLT 277  CREATININE 6.34*    Estimated Creatinine Clearance: 7.1 mL/min (A) (by C-G formula based on SCr of 6.34 mg/dL (H)).   Medical History: Past Medical History:  Diagnosis Date  . AAA (abdominal aortic aneurysm) ([place]) 06/2019   Multiple small pseudoaneurysmal projections of the dominant aorta.  Distal abdominal aortic aneurysm 4.5 x 4.7 cm.  Greatest AP dimension of the infrarenal aorta is 4.9.  No evidence of thoracic aortic aneurysm or dissection.  Brief segment of proximal IMA occlusion.  . CKD (chronic kidney disease) stage 3a, GFR 30-59 ml/min   . Coronary artery calcification seen on CAT scan 06/2019  . Diabetes mellitus without complication (HCC)    Type 2  . Heavy smoker (more than 20 cigarettes per day)    ~ 2 ppd; since age 44 (77 pk yr)  . Hyperlipidemia    Mixed  . Hypertension   . 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 -> TTE 07/24/19: Large size ill-defined echodensity in the left atrium suspicious for thrombus versus tumor.  Moderately dilated LA.--> CMR: Large mobile mass in the LA attached to the IAS-does not appear to be thrombus, characteristics consistent with LEFT ATRIAL MYXOMA  . Microscopic hematuria   . Plantar wart of right foot     Assessment: 70yo female went to PCP for N/V and found to have "abnormal labs" and sent to ED, found to be w/ AKI  (baseline SCr 1.2, now >6), to transition from Eliquis for Afib to UFH; last dose of Eliquis taken 1/20 10a.  Goal of Therapy:  Heparin level 0.3-0.7 units/ml aPTT 66-102 seconds Monitor platelets by anticoagulation protocol: Yes   Plan:  Will start heparin gtt at 900 units/hr and monitor heparin levels, aPTT (while apixaban affects anti-Xa), and CBC.  [name], PharmD, BCPS  07/01/2020,2:25 AM

## 2020-07-01 NOTE — ED Notes (Signed)
Bladder scan completed. 452mL noted in bladder. Pt states she does not feel like she is able to urinate at this time.

## 2020-07-01 NOTE — Plan of Care (Signed)

## 2020-07-01 NOTE — Progress Notes (Signed)
ABI completed   Please see CV Proc for preliminary results.   Vonzell Schlatter, RVT

## 2020-07-01 NOTE — ED Notes (Signed)
Breakfast Ordered 

## 2020-07-01 NOTE — ED Notes (Signed)
Pt returned from vascular, MD at bedside.

## 2020-07-01 NOTE — H&P (Addendum)
History and Physical    Kathryn Hancock UTM:[id number] DOB: 1951/03/28 DOA: 06/30/2020  PCP: Gala Lewandowsky, MD  Patient coming from: Home.  Chief Complaint: Worsening renal function.  HPI: Kathryn Hancock is a 70 y.o. female with history of left atrial myxoma, diabetes mellitus type 2, hypertension, chronic kidney disease stage III who had a left atrial myxoma resection and third week of December last month at [facility] during which patient had worsening renal function and lower extremity ischemic change and postoperative A. fib.  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  Was instructed to repeat and the repeat on showed worsening and was advised to come to the ER.  Patient states over the last 2 weeks patient has been worsening nausea vomiting unable to keep anything but denies any abdominal pain or diarrhea.  ED Course: In the ER patient is afebrile and not hypoxic.  Labs show creatinine of 6.3 hemoglobin 9.8 WBC 13 potassium 5.8.  EKG shows A. fib rate controlled.  ER physician discussed with on-call nephrologist Dr. Marval Regal who advised to give fluid for which patient was given fluid bolus and admitted for further observation.  COVID test is pending.  Review of Systems: As per HPI, rest all negative.   Past Medical History:  Diagnosis Date   AAA (abdominal aortic aneurysm) ([place]) 06/2019   Multiple small pseudoaneurysmal projections of the dominant aorta.  Distal abdominal aortic aneurysm 4.5 x 4.7 cm.  Greatest AP dimension of the infrarenal aorta is 4.9.  No evidence of thoracic aortic aneurysm or dissection.  Brief segment of proximal IMA occlusion.   CKD (chronic kidney disease) stage 3a, GFR 30-59 ml/min    Coronary artery calcification seen on CAT scan 06/2019   Diabetes mellitus without complication (HCC)    Type 2   Heavy smoker  (more than 20 cigarettes per day)    ~ 2 ppd; since age 3 (74 pk yr)   Hyperlipidemia    Mixed   Hypertension    LEFTATRIAL MYXOMA 06/2019   Seen on CTA Chest-Abd-Pelvis: Large geographic filling defect of LA associated with desnse radioopaque material. -> TTE 07/24/19: Large size ill-defined echodensity in the left atrium suspicious for thrombus versus tumor.  Moderately dilated LA.--> CMR: Large mobile mass in the LA attached to the IAS-does not appear to be thrombus, characteristics consistent with LEFT ATRIAL MYXOMA   Microscopic hematuria    Plantar wart of right foot     Past Surgical History:  Procedure Laterality Date   Cardiac MRI  01/22/2020   Normal LV size and function.  Moderate focal basal septal hypertrophy.  No S.A.M.  LVEF 61%.  RVEF 66%.  Large mobile mass in the left atrium attached to the interatrial septum-does not appear to thrombus characteristics consistent with left atrial myxoma.   Chest CTA  06/2019   Multiple small pseudoaneurysmal projections of the dominant aorta.  Distal abdominal aortic aneurysm 4.5 x 4.7 cm.  Greatest AP dimension of the infrarenal aorta is 4.9.  No evidence of thoracic aortic aneurysm or dissection.  Brief segment of proximal IMA occlusion.  Large geographic filling defect of the left atrium with associated dense radiopaque material.  Aortic atherosclerosis and emphysema   COLONOSCOPY W/ POLYPECTOMY     EYE SURGERY Bilateral    Cataract surgery   TRANSTHORACIC ECHOCARDIOGRAM  07/2019    EF 60 to 65%.  GR 1 DD.  Elevated LVEDP.  Large size ill-defined echodensity in the left atrium suspicious for thrombus versus tumor.  Moderately dilated LA.   TUBAL LIGATION       reports that she has been smoking cigarettes. She has a 100.00 pack-year smoking history. She has never used smokeless tobacco. She reports previous alcohol use. She reports that she does not use drugs.  Allergies  Allergen Reactions   Penicillins Other (See  Comments)    Childhood reaction.    Family History  Problem Relation Age of Onset   Diabetes Mother    Diabetes Father    Heart attack Father 67   CAD Father    Hyperlipidemia Father    Hypertension Father     Prior to Admission medications   Medication Sig Start Date End Date Taking? Authorizing Provider  acetaminophen (TYLENOL) 500 MG tablet Take 500-1,000 mg by mouth every 6 (six) hours as needed (for pain.).   Yes [provider]  amiodarone (PACERONE) 200 MG tablet Take 200 mg by mouth daily. 06/10/20 06/10/21 Yes [provider]  amLODipine (NORVASC) 5 MG tablet Take 5 mg by mouth daily. 06/10/20 06/10/21 Yes [provider]  apixaban (ELIQUIS) 5 MG TABS tablet Take 1 tablet (5 mg total) by mouth 2 (two) times daily. 12/10/19  Yes Leonie Man, MD  Ascorbic Acid (VITAMIN C) 1000 MG tablet Take 1,000 mg by mouth every evening.   Yes [provider]  aspirin EC 81 MG tablet Take 81 mg by mouth daily. Swallow whole.   Yes [provider]  atorvastatin (LIPITOR) 20 MG tablet Take 20 mg by mouth daily.   Yes [provider]  carvedilol (COREG) 12.5 MG tablet Take 12.5 mg by mouth 2 (two) times daily with a meal. 06/09/20 06/09/21 Yes [provider]  fluticasone (FLONASE) 50 MCG/ACT nasal spray Place 2 sprays into both nostrils as needed for allergies or rhinitis.   Yes [provider]  folic acid (FOLVITE) 510 MCG tablet Take 800 mcg by mouth daily.   Yes [provider]  furosemide (LASIX) 20 MG tablet Take 20 mg by mouth in the morning and at bedtime. 06/09/20 06/09/21 Yes [provider]  glimepiride (AMARYL) 1 MG tablet Take 1 mg by mouth in the morning and at bedtime. Hold if bg less than 120   Yes [provider]  glucose blood test strip 1 each by Other route as needed. Use as instructed   Yes [provider]  montelukast (SINGULAIR) 10 MG tablet Take 10 mg by  mouth daily as needed (allergies.).   Yes [provider]  oxyCODONE (OXY IR/ROXICODONE) 5 MG immediate release tablet Take 5-10 mg by mouth every 6 (six) hours as needed for moderate pain. 06/09/20  Yes [provider]  Vitamin D3 (VITAMIN D) 25 MCG tablet Take 2,000 Units by mouth every evening.   Yes [provider]    Physical Exam: Constitutional: Moderately built and nourished. Vitals:   07/01/20 0045 07/01/20 0100 07/01/20 0115 07/01/20 0130  BP: 105/61 (!) 103/58 132/81 102/70  Pulse: 93 (!) 107 (!) 101 90  Resp: 14 13 18 14   Temp:      TempSrc:      SpO2: 99% 97% 99% 96%  Weight:      Height:       Eyes: Anicteric no pallor. ENMT: No discharge from the ears eyes nose and mouth. Neck: No mass felt.  No neck rigidity. Respiratory:  No rhonchi or crepitations. Cardiovascular: S1-S2 heard. Abdomen: Soft nontender bowel sounds present. Musculoskeletal: No edema. Skin: No rash. Neurologic: Alert awake oriented to time place and person.  Moves all extremities. Psychiatric: Appears normal.  Normal affect.   Labs on Admission: I have personally reviewed following labs and imaging studies  CBC: Recent Labs  Lab 06/30/20 1508  WBC 13.0*  HGB 9.8*  HCT 30.2*  MCV 100.3*  PLT 607   Basic Metabolic Panel: Recent Labs  Lab 06/30/20 1508  NA 132*  K 5.8*  CL 99  CO2 18*  GLUCOSE 218*  BUN 87*  CREATININE 6.34*  CALCIUM 9.2   GFR: Estimated Creatinine Clearance: 7.1 mL/min (A) (by C-G formula based on SCr of 6.34 mg/dL (H)). Liver Function Tests: Recent Labs  Lab 06/30/20 1508  AST 12*  ALT 22  ALKPHOS 170*  BILITOT 0.8  PROT 6.9  ALBUMIN 3.1*   Recent Labs  Lab 06/30/20 1508  LIPASE 32   No results for input(s): AMMONIA in the last 168 hours. Coagulation Profile: No results for input(s): INR, PROTIME in the last 168 hours. Cardiac Enzymes: No results for input(s): CKTOTAL, CKMB, CKMBINDEX, TROPONINI in the last 168  hours. BNP (last 3 results) No results for input(s): PROBNP in the last 8760 hours. HbA1C: No results for input(s): HGBA1C in the last 72 hours. CBG: No results for input(s): GLUCAP in the last 168 hours. Lipid Profile: No results for input(s): CHOL, HDL, LDLCALC, TRIG, CHOLHDL, LDLDIRECT in the last 72 hours. Thyroid Function Tests: No results for input(s): TSH, T4TOTAL, FREET4, T3FREE, THYROIDAB in the last 72 hours. Anemia Panel: No results for input(s): VITAMINB12, FOLATE, FERRITIN, TIBC, IRON, RETICCTPCT in the last 72 hours. Urine analysis: No results found for: COLORURINE, APPEARANCEUR, LABSPEC, PHURINE, GLUCOSEU, HGBUR, BILIRUBINUR, KETONESUR, PROTEINUR, UROBILINOGEN, NITRITE, LEUKOCYTESUR Sepsis Labs: @LABRCNTIP (procalcitonin:4,lacticidven:4) )No results found for this or any previous visit (from the past 240 hour(s)).   Radiological Exams on Admission: CT RENAL STONE STUDY  Result Date: 07/01/2020 CLINICAL DATA:  Flank pain EXAM: CT ABDOMEN AND PELVIS WITHOUT CONTRAST TECHNIQUE: Multidetector CT imaging of the abdomen and pelvis was performed following the standard protocol without IV contrast. COMPARISON:  None. FINDINGS: Lower chest: The visualized heart size within normal limits. No pericardial fluid/thickening. No hiatal hernia. There is patchy airspace consolidation seen at the posterior right lung base Hepatobiliary: Although limited due to the lack of intravenous contrast, normal in appearance without gross focal abnormality. Scattered calcified gallstones are present. There are 2 layering within the gallbladder fundus the largest measuring 9 mm. Pancreas:  Unremarkable.  No surrounding inflammatory changes. Spleen: Normal in size. Although limited due to the lack of intravenous contrast, normal in appearance. Adrenals/Urinary Tract: Both adrenal glands appear normal. There is a punctate nonobstructing renal calculi in the upper pole of the right kidney. No left-sided renal or  collecting system calculi are noted. Bladder is unremarkable. Stomach/Bowel: The stomach, small bowel, and colon are normal in appearance. No inflammatory changes or obstructive findings. Scattered colonic diverticula are noted. Appendix is normal. Vascular/Lymphatic: There are no enlarged abdominal or pelvic lymph nodes. 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 The maximum dimension of the aorta with focal outpouching/pseudoaneurysm at the level of L3 measures 4.9 cm. The infrarenal abdominal aorta measures maximum diameter 4.7 cm which is also unchanged from the prior exam. There is scattered atherosclerosis seen throughout. Reproductive: For  Other: No evidence of abdominal wall mass or hernia. Musculoskeletal: No acute or significant osseous findings. IMPRESSION: 1. Patchy airspace consolidation at the posterior right lung base which could be due to atelectasis or infectious etiology. 2. Nonobstructing punctate right renal calculi. 3. Cholelithiasis 4. 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 This is unchanged from prior exam dating back to June 26, 2019. 5. Aortic Atherosclerosis (ICD10-I70.0). Electronically Signed   By: Prudencio Pair M.D.   On: 07/01/2020 02:05    EKG: Independently reviewed.  A. fib rate controlled.  Assessment/Plan Principal Problem:   ARF (acute renal failure) (HCC) Active Problems:   Left atrial myxoma   AAA (abdominal aortic aneurysm) without rupture (HCC)   PAF (paroxysmal atrial fibrillation) (HCC)   Anemia   Essential hypertension   DM (diabetes mellitus), type 2 with renal complications ([place])    1. Acute on chronic kidney disease stage III -ER physician has discussed with on-call nephrologist Dr. Marval Regal  who advised fluid for which patient was given 1 L fluid bolus.  Closely follow intake output metabolic panel and UA.  Will repeat metabolic panel after fluid bolus and await further recommendations per nephrologist.  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  Patient was given 1 dose of Lokelma for hyperkalemia. 2. Nausea vomiting could be from uremia abdomen appears benign.  CT renal studies are pending.  Closely monitor. 3. A. fib rate controlled presently on Coreg amiodarone and since patient has worsening renal function we will keep patient on heparin hold Eliquis. 4. Lower extremity vascular changes for which patient is on anticoagulation. 5. Hypertension on amlodipine and Coreg. 6. Diabetes mellitus type 2 we will keep patient on sliding scale coverage. 7. Anemia likely from renal disease hemoglobin is at baseline compared to the last month in Care Everywhere. 8. Recent surgery for left atrial myxoma at [facility] last month. 9. History of abdominal aortic aneurysm.  Since patient has worsening renal function with nausea vomiting will need further close monitoring and inpatient status.  COVID test is pending.  Chest x-ray is pending.  Follow CT renal study.   DVT prophylaxis: Heparin infusion. Code Status: Full code. Family Communication: Discussed with patient. Disposition Plan: Home. Consults called: ER physician discussed with nephrologist. Admission status: Inpatient.   Rise Patience MD [facility] Pager [phone].  If 7PM-7AM, please contact night-coverage [email or web address] Password [email or web address]  07/01/2020, 2:11 AM

## 2020-07-01 NOTE — Progress Notes (Signed)
NEW ADMISSION NOTE  Arrival Method: bed Mental Orientation: Alert and oriented x4  Telemetry: yes Assessment: Completed Skin: see notes Iv: right hand Pain: 0 Tubes: 1 Safety Measures: Safety Fall Prevention Plan has been given, discussed and signed Admission: Completed [facility] Orientation: Patient has been orientated to the room, unit and staff.  Family: 0  Orders have been reviewed and implemented. Will continue to monitor the patient. Call light has been placed within reach and bed alarm has been activated.   COVID Vaccination Moderna 1st dose on 10/22/2019 and second dose 11/20/2019.  Beatris Ship, RN

## 2020-07-01 NOTE — ED Notes (Signed)
Lunch Tray Ordered @ 1046. 

## 2020-07-01 NOTE — Consult Note (Addendum)
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  She had some saccular components to the aneurysm at the level of the renal arteries and therefore I felt that it is enlarged she would require a fenestrated graft versus a thoracoabdominal repair and would have to be referred to [facility].  She preferred to have this followed here unless it enlarged significantly.  Her CT scan today shows no change in the size of the aneurysm over the last year.  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  I will order ABIs.  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 Right great toe pressure is 65 and right TBI is 0.34.  She has limiting claudication. ABIs: right 0.65 and left 0.69. On aspirin and statin. Betadine paint to right second toe. Her foot pain is more likely associated with her acute embolic events and not from ischemic rest pain. She is able to ambulate inside her home and is limiting going outside due to Covid pandemic and post-operative state.  History of pararenal AAA: Maximal diameter of 4.9 on CT done today.  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 Nephrology consulted.  Atrial fibrillation: on apixiban and amiodarone  Covid-19 results are pending.  Dr Scot Dock, on call vascular surgeon, will evaluate today and provide further recommendations.  Risa Grill, PA-C Office: [phone] Office: [phone]    HPI:   Kathryn Hancock is a pleasant 70 y.o. female, who  presented to ED yesterday afternoon at the advice of her PCP due to "abnormal labs".  She was complaining of nausea and vomiting. Her creatinine was found to be 6.34 up from 3.31.  She states she had blunt injury to her right second toe about 2 months ago and developed purple discoloration in December prior to her open heart surgery.  She has bilateral toe pain that awakens her at night. She has bilateral lower extremity limiting claudication of about 25 to 50 feet.  On assessment found to be in a. Fib with ventricular rate of 89. She has a history of atrial myxoma with recent resection approximately one month ago at [facility]. She develop worsening of her CKD 3 and developed post-operative atrial fibrillation. She is maintained on apixaban.  She has a history of a saccular aneurysm of the infrarenal aorta first noted on CT urogram.  She was evaluated in the office by Dr. Doren Custard on April 01, 2019.  CT of the chest abdomen pelvis were performed which he reviewed with the patient at time of follow-up on July 01, 2019.  Findings of 4.9 cm pararenal abdominal aortic aneurysm were noted.  Due to some saccular components to this aneurysm at the level of the renal arteries, he explained to her that if this area enlarged significantly, she would have to be considered for a fenestrated graft versus thoracoabdominal repair. Plans were made for follow-up with CTA of the abdomen and pelvis in 9 months.  She denies significant, episodic abdominal or back pain.  In addition, she endorsed bilateral lower extremity claudication and she was to have ankle brachial indices performed at the time of her follow-up.  She did not have lower extremity tissue loss at that time.   Her medical history is reviewed: positive history of DM-2, hypertension. Tobacco use history: 100 pyh  Past Medical History:  Diagnosis Date   AAA (abdominal aortic aneurysm) ([place]) 06/2019   Multiple small pseudoaneurysmal  projections of the dominant aorta.  Distal abdominal aortic aneurysm 4.5 x 4.7 cm.  Greatest AP dimension of the infrarenal aorta is 4.9.  No evidence of thoracic aortic aneurysm or dissection.  Brief segment of proximal IMA occlusion.   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 -> TTE 07/24/19: Large size ill-defined echodensity in the left atrium suspicious for thrombus versus tumor.  Moderately dilated LA.--> CMR: Large mobile mass in the LA attached to the IAS-does not appear to be thrombus, characteristics consistent with LEFT ATRIAL MYXOMA   Microscopic hematuria    Plantar wart of right foot     Family History  Problem Relation Age of Onset   Diabetes Mother    Diabetes Father    Heart attack Father 85   CAD Father    Hyperlipidemia Father    Hypertension Father     SOCIAL HISTORY: Social History   Socioeconomic History   Marital status: Widowed    Spouse name: Not on file   Number of children: Not on file   Years of education: Not on file   Highest education level: Not on file  Occupational History   Not on file  Tobacco Use   Smoking status: Current Every Day Smoker    Packs/day: 2.00    Years: 50.00    Pack years: 100.00    Types: Cigarettes   Smokeless tobacco: Never Used  Vaping Use   Vaping Use: Never used  Substance and Sexual Activity   Alcohol use: Not Currently   Drug use: Never   Sexual activity: Not on file  Other Topics Concern   Not on file  Social History Narrative   She is a mother of 2-but her son died in Oct 21, 2019 of a  drug overdose.   Denies alcohol use.   Social Determinants of Health   Financial Resource Strain: Not on file  Food Insecurity: Not on file  Transportation Needs: Not on file  Physical Activity: Not on file  Stress: Not on file  Social Connections: Not on file  Intimate Partner Violence: Not on file    Allergies  Allergen Reactions   Penicillins Other (See Comments)    Childhood reaction.    Current Facility-Administered Medications  Medication Dose Route Frequency Provider Last Rate Last Admin   acetaminophen (TYLENOL) tablet 650 mg  650 mg Oral Q6H PRN Rise Patience, MD       Or   acetaminophen (TYLENOL) suppository 650 mg  650 mg Rectal Q6H PRN Rise Patience, MD       amiodarone (PACERONE) tablet 200 mg  200  mg Oral Daily Rise Patience, MD       amLODipine (NORVASC) tablet 5 mg  5 mg Oral Daily Rise Patience, MD       aspirin EC tablet 81 mg  81 mg Oral Daily Rise Patience, MD       atorvastatin (LIPITOR) tablet 20 mg  20 mg Oral q1800 Rise Patience, MD       carvedilol (COREG) tablet 12.5 mg  12.5 mg Oral BID WC Rise Patience, MD   12.5 mg at 07/01/20 0848   dextrose 50 % solution            folic acid (FOLVITE) tablet 1 mg  1,000 mcg Oral Daily Rise Patience, MD       heparin ADULT infusion 100 units/mL (25000 units/258mL)  900 Units/hr Intravenous Continuous Laren Everts, RPH 9 mL/hr at 07/01/20 0257 900 Units/hr at 07/01/20 0257   insulin aspart (novoLOG) injection 0-6 Units  0-6 Units Subcutaneous TID WC Rise Patience, MD       oxyCODONE (Oxy IR/ROXICODONE) immediate release tablet 5-10 mg  5-10 mg Oral Q6H PRN Rise Patience, MD       Current Outpatient Medications  Medication Sig Dispense Refill   acetaminophen (TYLENOL) 500 MG tablet Take 500-1,000 mg by mouth every 6 (six) hours as needed (for pain.).     amiodarone (PACERONE) 200 MG tablet Take 200 mg by mouth daily.      amLODipine (NORVASC) 5 MG tablet Take 5 mg by mouth daily.     apixaban (ELIQUIS) 5 MG TABS tablet Take 1 tablet (5 mg total) by mouth 2 (two) times daily. 60 tablet 6   Ascorbic Acid (VITAMIN C) 1000 MG tablet Take 1,000 mg by mouth every evening.     aspirin EC 81 MG tablet Take 81 mg by mouth daily. Swallow whole.     atorvastatin (LIPITOR) 20 MG tablet Take 20 mg by mouth daily.     carvedilol (COREG) 12.5 MG tablet Take 12.5 mg by mouth 2 (two) times daily with a meal.     fluticasone (FLONASE) 50 MCG/ACT nasal spray Place 2 sprays into both nostrils as needed for allergies or rhinitis.     folic acid (FOLVITE) 283 MCG tablet Take 800 mcg by mouth daily.     furosemide (LASIX) 20 MG tablet Take 20 mg by mouth in the morning and at bedtime.     glimepiride (AMARYL) 1 MG tablet Take 1 mg by mouth in the morning and at bedtime. Hold if bg less than 120     glucose blood test strip 1 each by Other route as needed. Use as instructed     montelukast (SINGULAIR) 10 MG tablet Take 10 mg by mouth daily as needed (allergies.).     oxyCODONE (OXY IR/ROXICODONE) 5 MG immediate release tablet Take 5-10 mg by mouth every 6 (six) hours as needed for moderate pain.     Vitamin D3 (VITAMIN D) 25 MCG tablet Take 2,000 Units by mouth every evening.      REVIEW OF SYSTEMS:  [X]  denotes positive finding, [ ]  denotes negative finding Cardiac  Comments:  Chest pain or chest pressure:    Shortness of breath upon exertion:    Short of breath when lying flat:    Irregular heart rhythm:        Vascular    Pain in calf, thigh, or hip brought on by ambulation:    Pain in  feet at night that wakes you up from your sleep:  x   Blood clot in your veins:    Leg swelling:         Pulmonary    Oxygen at home:    Productive cough:     Wheezing:         Neurologic    Sudden weakness in arms or legs:     Sudden numbness in arms or legs:     Sudden onset of difficulty speaking or slurred speech:     Temporary loss of vision in one eye:     Problems with dizziness:         Gastrointestinal    Blood in stool:     Vomited blood:         Genitourinary    Burning when urinating:     Blood in urine:        Psychiatric    Major depression:         Hematologic    Bleeding problems:    Problems with blood clotting too easily:        Skin    Rashes or ulcers: x       Constitutional    Fever or chills:     PHYSICAL EXAM:   Vitals:   07/01/20 0130 07/01/20 0215 07/01/20 0400 07/01/20 0800  BP: 102/70 97/64 102/76 100/65  Pulse: 90 99 86 (!) 104  Resp: 14 12 (!) 23 15  Temp:    98.3 F (36.8 C)  TempSrc:    Oral  SpO2: 96% 97% 96% 98%  Weight:      Height:        GENERAL: The patient is a well-nourished female, in no acute distress. The vital signs are documented above. CARDIAC: There is a regular rate and rhythm.  VASCULAR: No carotid bruits detected. Palpable femoral pulses bilaterally (CSD) . Pedal pulses not palpable. + Doppler signal of right PT and left PT and peroneal.  (The handheld Doppler unit was fitted with fetal Doppler probe.) EXTREMITIES: Both feet are warm with intact sensation and motor function. PULMONARY: There is good air exchange bilaterally without wheezing or rales. ABDOMEN: Soft and non-tender with normal pitched bowel sounds.  MUSCULOSKELETAL: There are no major deformities. NEUROLOGIC: No focal weakness or paresthesias are detected. SKIN: Dusky discoloration of tips of all ten toes. Dry eschar of tip of right second toe. PSYCHIATRIC: The patient has a normal affect.        DATA:    05/31/2020: ABIs at [facility] Impression:   1. Multilevel pulse volume recording and duplex waveforms performed at rest  demonstrate:  Right leg: Monophasic waveforms throughout with diminished dorsalis pedis.  Left leg: Monophasic waveforms throughout with similar appearance  superficial femoral to dorsalis pedis.   2. Rest ABIs demonstrate:  Right leg:  0.65  Left leg: 0.69  Right TBI: 0.34  Left TBI: 0.47    Pressures/indices (right):  Brachial: 171  High thigh: 203/1.10  Low thigh: 163/0.89  Calf: 126/0.68  Ankle (PT): 119/0.65  Ankle (DP): 116/0.63  Great toe/brachial: 62/0.34   Pressures/indices (left):  Brachial: 184  High thigh: >254/-Hubbell-  Low thigh: 161/0.88  Calf: 125/0.68  Ankle (PT): 127/0.69  Ankle (DP): 126/0.68  Great toe/brachial: 87/0.47   07/01/2020 CT ABDOMEN AND PELVIS WITHOUT CONTRAST  IMPRESSION: 1. Patchy airspace consolidation at the posterior right lung base which could be due to atelectasis or infectious etiology. 2. Nonobstructing punctate right renal calculi. 3. Cholelithiasis  4. 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 This is unchanged from prior exam dating back to June 26, 2019. 5. Aortic Atherosclerosis (ICD10-I70.0).

## 2020-07-01 NOTE — Consult Note (Signed)
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 She was established with nephrology at [facility] after worsening renal function on her admission. Review of office visit shows patient had CKD with scr 1.3mg /dl on 01/2020 attributed to HTN and T2DM. During her hospitalization  Her creatine rose to 4.1 and the inpatient nephrology team assessed this was from cholesterol embolic disease. She had a necrotic toe when she was admitted at [facility]. Patient says her PCP noted her lab work came back abnormal and told her to come to the ED. [facility] was too far and she decided to come here. She also notes in the last two weeks she has experienced nausea,light headiness, and sometimes vomiting when she stands up. Her blood pressure at home has also been lower than normal, reports SBP in the 90's. Patient denies any headache, sick contacts, changes in vision, sore throat, NSAID use, dysuria, abdominal pain, focal weakness, changes in normal bowel habits, or difficulty urinating.   Review of her PCP visit in care everywhere show sCr 6.32, Bun 79, K 6.8, CO2 18.   Her nephrologist is Dr.Ruediger Tally Due. Her primary care doctor is Dr.Rowena Sistasis.     PMH: Past Medical History:  Diagnosis Date  . AAA (abdominal aortic aneurysm) ([place]) 06/2019   Multiple small pseudoaneurysmal projections of the dominant aorta.  Distal abdominal aortic aneurysm 4.5 x 4.7 cm.  Greatest AP dimension of the infrarenal aorta is 4.9.  No evidence of thoracic aortic aneurysm or dissection.  Brief segment of proximal IMA occlusion.  . CKD (chronic kidney disease) stage 3a, GFR 30-59 ml/min   .  Coronary artery calcification seen on CAT scan 06/2019  . Diabetes mellitus without complication (HCC)    Type 2  . Heavy smoker (more than 20 cigarettes per day)    ~ 2 ppd; since age 56 (60 pk yr)  . Hyperlipidemia    Mixed  . Hypertension   . 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 -> TTE 07/24/19: Large size ill-defined echodensity in the left atrium suspicious for thrombus versus tumor.  Moderately dilated LA.--> CMR: Large mobile mass in the LA attached to the IAS-does not appear to be thrombus, characteristics consistent with LEFT ATRIAL MYXOMA  . Microscopic hematuria   . Plantar wart of right foot    PSH: Past Surgical History:  Procedure Laterality Date  . Cardiac MRI  01/22/2020   Normal LV size and function.  Moderate focal basal septal hypertrophy.  No S.A.M.  LVEF 61%.  RVEF 66%.  Large mobile mass in the left atrium attached to the interatrial septum-does not appear to thrombus characteristics consistent with left atrial myxoma.  . Chest CTA  06/2019   Multiple small pseudoaneurysmal projections of the dominant aorta.  Distal abdominal aortic aneurysm 4.5 x 4.7 cm.  Greatest AP dimension of the infrarenal aorta is 4.9.  No evidence of thoracic aortic aneurysm or dissection.  Brief segment of proximal IMA occlusion.  Large geographic filling defect of the left atrium with associated dense radiopaque material.  Aortic atherosclerosis and emphysema  . COLONOSCOPY W/ POLYPECTOMY    .  EYE SURGERY Bilateral    Cataract surgery  . TRANSTHORACIC ECHOCARDIOGRAM  07/2019    EF 60 to 65%.  GR 1 DD.  Elevated LVEDP.  Large size ill-defined echodensity in the left atrium suspicious for thrombus versus tumor.  Moderately dilated LA.  . TUBAL LIGATION        Past Medical History:  Diagnosis Date  . AAA (abdominal aortic aneurysm) ([place]) 06/2019   Multiple small pseudoaneurysmal  projections of the dominant aorta.  Distal abdominal aortic aneurysm 4.5 x 4.7 cm.  Greatest AP dimension of the infrarenal aorta is 4.9.  No evidence of thoracic aortic aneurysm or dissection.  Brief segment of proximal IMA occlusion.  . CKD (chronic kidney disease) stage 3a, GFR 30-59 ml/min   . Coronary artery calcification seen on CAT scan 06/2019  . Diabetes mellitus without complication (HCC)    Type 2  . Heavy smoker (more than 20 cigarettes per day)    ~ 2 ppd; since age 76 (60 pk yr)  . Hyperlipidemia    Mixed  . Hypertension   . 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 -> TTE 07/24/19: Large size ill-defined echodensity in the left atrium suspicious for thrombus versus tumor.  Moderately dilated LA.--> CMR: Large mobile mass in the LA attached to the IAS-does not appear to be thrombus, characteristics consistent with LEFT ATRIAL MYXOMA  . Microscopic hematuria   . Plantar wart of right foot     Medications:  I have reviewed the patient's current medications.  (Not in a hospital admission)   ALLERGIES:   Allergies  Allergen Reactions  . Penicillins Other (See Comments)    Childhood reaction.    FAM HX: Family History  Problem Relation Age of Onset  . Diabetes Mother   . Diabetes Father   . Heart attack Father 39  . CAD Father   . Hyperlipidemia Father   . Hypertension Father     Social History:   reports that she has been smoking cigarettes. She has a 100.00 pack-year smoking history. She has never used smokeless tobacco. She reports previous alcohol use. She reports that she does not use drugs.  ROS: Complete ROS negative if not mentioned in HPI.  Blood pressure 107/67, pulse 100, temperature 98.3 F (36.8 C), temperature source Oral, resp. rate (!) 24, height 5\' 3"  (1.6 m), weight 61.2 kg, SpO2 99 %. PHYSICAL EXAM: General: NAD, nl appearance HE:  Normocephalic, atraumatic , EOMI, Conjunctivae pale ENT: No congestion, no rhinorrhea, no exudate or erythema  Chest: Incision over sternum intact, non erythematous, no drainage Cardiovascular: irregular irregular pulse . No murmurs rubs or gallops, no jvd Pulmonary : Effort normal, breath sounds normal. No wheezes, rales, or rhonchi Abdominal: soft, nontender,  bowel sounds present Msk: Mo deformities Skin:ischemic changes in both feet as below   Psychiatric/Behavioral:  normal mood, normal behavior  Neuro: Alert and oriented, no focal weakness   Results for orders placed or performed during the hospital encounter of 06/30/20 (from the past 48 hour(s))  CBC     Status: Abnormal   Collection Time: 06/30/20  3:08 PM  Result Value Ref Range   WBC 13.0 (H) 4.0 - 10.5 K/uL   RBC 3.01 (L) 3.87 - 5.11 MIL/uL   Hemoglobin 9.8 (L) 12.0 - 15.0 g/dL   HCT 30.2 (L) 36.0 - 46.0 %   MCV 100.3 (H) 80.0 - 100.0 fL   MCH  32.6 26.0 - 34.0 pg   MCHC 32.5 30.0 - 36.0 g/dL   RDW 15.4 11.5 - 15.5 %   Platelets 277 150 - 400 K/uL   nRBC 0.0 0.0 - 0.2 %    Comment: Performed at [facility], [place] [address], [place], [place] [address]  Comprehensive metabolic panel     Status: Abnormal   Collection Time: 06/30/20  3:08 PM  Result Value Ref Range   Sodium 132 (L) 135 - 145 mmol/L   Potassium 5.8 (H) 3.5 - 5.1 mmol/L   Chloride 99 98 - 111 mmol/L   CO2 18 (L) 22 - 32 mmol/L   Glucose, Bld 218 (H) 70 - 99 mg/dL    Comment: Glucose reference range applies only to samples taken after fasting for at least 8 hours.   BUN 87 (H) 8 - 23 mg/dL   Creatinine, Ser 6.34 (H) 0.44 - 1.00 mg/dL   Calcium 9.2 8.9 - 10.3 mg/dL   Total Protein 6.9 6.5 - 8.1 g/dL   Albumin 3.1 (L) 3.5 - 5.0 g/dL   AST 12 (L) 15 - 41 U/L   ALT 22 0 - 44 U/L   Alkaline Phosphatase 170 (H) 38 - 126 U/L   Total Bilirubin 0.8 0.3 - 1.2 mg/dL   GFR, Estimated 7 (L) >60 mL/min    Comment: (NOTE) Calculated using the CKD-EPI  Creatinine Equation (2021)    Anion gap 15 5 - 15    Comment: Performed at [place] [address], [place], [place] [address]  Lipase, blood     Status: None   Collection Time: 06/30/20  3:08 PM  Result Value Ref Range   Lipase 32 11 - 51 U/L    Comment: Performed at [facility], [place] [address] [place], [place] [address]  SARS CORONAVIRUS 2 (TAT 6-24 HRS) Nasopharyngeal Nasopharyngeal Swab     Status: None   Collection Time: 07/01/20  5:46 AM   Specimen: Nasopharyngeal Swab  Result Value Ref Range   SARS Coronavirus 2 NEGATIVE NEGATIVE    Comment: (NOTE) SARS-CoV-2 target nucleic acids are NOT DETECTED.  The SARS-CoV-2 RNA is generally detectable in upper and lower respiratory specimens during the acute phase of infection. Negative results do not preclude SARS-CoV-2 infection, do not rule out co-infections with other pathogens, and should not be used as the sole basis for treatment or other patient management decisions. Negative results must be combined with clinical observations, patient history, and epidemiological information. The expected result is Negative.  Fact Sheet for Patients: SugarRoll.be  Fact Sheet for Healthcare Providers: https://www.woods-mathews.com/  This test is not yet approved or cleared by the [place] FDA and  has been authorized for detection and/or diagnosis of SARS-CoV-2 by FDA under an Emergency Use Authorization (EUA). This EUA will remain  in effect (meaning this test can be used) for the duration of the COVID-19 declaration under Se ction 564(b)(1) of the Act, 21 U.S.C. section 360bbb-3(b)(1), unless the authorization is terminated or revoked sooner.  Performed at [facility], [place] [address], [place], [place] [address]   HIV Antibody (routine testing w rflx)     Status: None   Collection Time: 07/01/20  7:00 AM  Result Value Ref Range   HIV Screen 4th Generation wRfx  Non Reactive Non Reactive    Comment: Performed at [facility], [place] [address], [place], [place] [address]  CBC     Status: Abnormal  Collection Time: 07/01/20  7:00 AM  Result Value Ref Range   WBC 9.6 4.0 - 10.5 K/uL   RBC 2.88 (L) 3.87 - 5.11 MIL/uL   Hemoglobin 9.2 (L) 12.0 - 15.0 g/dL   HCT 29.2 (L) 36.0 - 46.0 %   MCV 101.4 (H) 80.0 - 100.0 fL   MCH 31.9 26.0 - 34.0 pg   MCHC 31.5 30.0 - 36.0 g/dL   RDW 15.0 11.5 - 15.5 %   Platelets 207 150 - 400 K/uL   nRBC 0.0 0.0 - 0.2 %    Comment: Performed at [facility], [place] [address], [place], [place] [address]  Comprehensive metabolic panel     Status: Abnormal   Collection Time: 07/01/20  7:00 AM  Result Value Ref Range   Sodium 134 (L) 135 - 145 mmol/L   Potassium 4.9 3.5 - 5.1 mmol/L   Chloride 104 98 - 111 mmol/L   CO2 15 (L) 22 - 32 mmol/L   Glucose, Bld 71 70 - 99 mg/dL    Comment: Glucose reference range applies only to samples taken after fasting for at least 8 hours.   BUN 88 (H) 8 - 23 mg/dL   Creatinine, Ser 6.24 (H) 0.44 - 1.00 mg/dL   Calcium 8.9 8.9 - 10.3 mg/dL   Total Protein 6.3 (L) 6.5 - 8.1 g/dL   Albumin 2.9 (L) 3.5 - 5.0 g/dL   AST 13 (L) 15 - 41 U/L   ALT 19 0 - 44 U/L   Alkaline Phosphatase 144 (H) 38 - 126 U/L   Total Bilirubin 0.8 0.3 - 1.2 mg/dL   GFR, Estimated 7 (L) >60 mL/min    Comment: (NOTE) Calculated using the CKD-EPI Creatinine Equation (2021)    Anion gap 15 5 - 15    Comment: Performed at [facility], [place] [address], [place], [place] [address]  CBG monitoring, ED     Status: Abnormal   Collection Time: 07/01/20  8:22 AM  Result Value Ref Range   Glucose-Capillary 62 (L) 70 - 99 mg/dL    Comment: Glucose reference range applies only to samples taken after fasting for at least 8 hours.  CBG monitoring, ED     Status: Abnormal   Collection Time: 07/01/20  9:51 AM  Result Value Ref Range   Glucose-Capillary 190 (H) 70 - 99 mg/dL    Comment: Glucose reference range  applies only to samples taken after fasting for at least 8 hours.  CBG monitoring, ED     Status: Abnormal   Collection Time: 07/01/20 11:16 AM  Result Value Ref Range   Glucose-Capillary 126 (H) 70 - 99 mg/dL    Comment: Glucose reference range applies only to samples taken after fasting for at least 8 hours.  Heparin level (unfractionated)     Status: Abnormal   Collection Time: 07/01/20 11:20 AM  Result Value Ref Range   Heparin Unfractionated >2.20 (H) 0.30 - 0.70 IU/mL    Comment: RESULTS CONFIRMED BY MANUAL DILUTION Performed at [facility], [place] [address] [place], [place] [address]   APTT     Status: Abnormal   Collection Time: 07/01/20 11:20 AM  Result Value Ref Range   aPTT 54 (H) 24 - 36 seconds    Comment:        IF BASELINE aPTT IS ELEVATED, SUGGEST PATIENT RISK ASSESSMENT BE USED TO DETERMINE APPROPRIATE ANTICOAGULANT THERAPY. Performed at [facility], [place],  [place] [address]     DG CHEST PORT 1 VIEW  Result Date: 07/01/2020 CLINICAL DATA:  Vomiting EXAM: PORTABLE CHEST 1 VIEW COMPARISON:  June 26, 2019 FINDINGS: The patient is status post prior median sternotomy. The heart size is enlarged but stable. There is no pneumothorax. No large pleural effusion. Aortic calcifications are noted. There is no acute osseous abnormality. There is a rounded density projecting over the left lower lung zone. IMPRESSION: 1. No acute cardiopulmonary process. 2. Rounded density projecting over the left lower lung zone is likely a nipple shadow. However, a follow-up two-view chest x-ray in 4-6 weeks is recommended to confirm stability or resolution of this finding. Electronically Signed   By: Constance Holster M.D.   On: 07/01/2020 06:14   VAS [place] ABI WITH/WO TBI  Result Date: 07/01/2020 LOWER EXTREMITY DOPPLER STUDY Indications: Gangrene, and peripheral artery disease. High Risk Factors: Hypertension, Diabetes, current smoker.  Comparison Study: No  previous exam Performing Technologist: Vonzell Schlatter RVT  Examination Guidelines: A complete evaluation includes at minimum, Doppler waveform signals and systolic blood pressure reading at the level of bilateral brachial, anterior tibial, and posterior tibial arteries, when vessel segments are accessible. Bilateral testing is considered an integral part of a complete examination. Photoelectric Plethysmograph (PPG) waveforms and toe systolic pressure readings are included as required and additional duplex testing as needed. Limited examinations for reoccurring indications may be performed as noted.  ABI Findings: +--------+------------------+-----+----------+--------+ Right   Rt Pressure (mmHg)IndexWaveform  Comment  +--------+------------------+-----+----------+--------+ [id number]                                       +--------+------------------+-----+----------+--------+ PTA     65                0.60 monophasic         +--------+------------------+-----+----------+--------+ DP      65                0.60 monophasic         +--------+------------------+-----+----------+--------+ +--------+------------------+-----+----------+-------+ Left    Lt Pressure (mmHg)IndexWaveform  Comment +--------+------------------+-----+----------+-------+ [id number]                                      +--------+------------------+-----+----------+-------+ PTA     66                0.61 monophasic        +--------+------------------+-----+----------+-------+ DP      58                0.53 monophasic        +--------+------------------+-----+----------+-------+  Summary: Right: Resting right ankle-brachial index indicates moderate right lower extremity arterial disease. Left: Resting left ankle-brachial index indicates moderate left lower extremity arterial disease.  *See table(s) above for measurements and observations.    Preliminary    CT RENAL STONE STUDY  Result Date:  07/01/2020 CLINICAL DATA:  Flank pain EXAM: CT ABDOMEN AND PELVIS WITHOUT CONTRAST TECHNIQUE: Multidetector CT imaging of the abdomen and pelvis was performed following the standard protocol without IV contrast. COMPARISON:  None. FINDINGS: Lower chest: The visualized heart size within normal limits. No pericardial fluid/thickening. No hiatal hernia. There is patchy airspace consolidation seen at the posterior right lung base Hepatobiliary: Although limited due to the lack of intravenous contrast, normal in appearance without  gross focal abnormality. Scattered calcified gallstones are present. There are 2 layering within the gallbladder fundus the largest measuring 9 mm. Pancreas:  Unremarkable.  No surrounding inflammatory changes. Spleen: Normal in size. Although limited due to the lack of intravenous contrast, normal in appearance. Adrenals/Urinary Tract: Both adrenal glands appear normal. There is a punctate nonobstructing renal calculi in the upper pole of the right kidney. No left-sided renal or collecting system calculi are noted. Bladder is unremarkable. Stomach/Bowel: The stomach, small bowel, and colon are normal in appearance. No inflammatory changes or obstructive findings. Scattered colonic diverticula are noted. Appendix is normal. Vascular/Lymphatic: There are no enlarged abdominal or pelvic lymph nodes. 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 The maximum dimension of the aorta with focal outpouching/pseudoaneurysm at the level of L3 measures 4.9 cm. The infrarenal abdominal aorta measures maximum diameter 4.7 cm which is also unchanged from the prior exam. There is scattered atherosclerosis seen throughout. Reproductive: For Other: No evidence of abdominal wall mass or hernia. Musculoskeletal: No acute or significant osseous findings. IMPRESSION: 1. Patchy airspace  consolidation at the posterior right lung base which could be due to atelectasis or infectious etiology. 2. Nonobstructing punctate right renal calculi. 3. Cholelithiasis 4. 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 This is unchanged from prior exam dating back to June 26, 2019. 5. Aortic Atherosclerosis (ICD10-I70.0). Electronically Signed   By: Prudencio Pair M.D.   On: 07/01/2020 02:05    Assessment/Plan AKI on CKD stage III, aneuric  NAGMA - sCr 4, BUN 45 at her last nephrology visit. Recent progression of mild CKD to CKD III thought to be from thromboembolic disease. Patient was on Eliquis. She came in with embolic disease to both feet and worsening renal function. - Notable labs:sCr 6.24, BUN 88, CO2 15, AG 15,  -Review of CT renal stone study shows nonobstructing right renal calculi. Noted also the cholelithiasis and AAA.  - Patient IV fluids @75cc /hr, agree with gentle fluids in setting of possible prerenal injury on top of CKD. This could also be worsening embolic disease.  - agree with holding lasix and glimepiride  - UA not collected given patient is aneuric - Will discuss with Dr.Bhandari to see if a renal artery duplex is needed.  - Avoid nephrotoxins including NSAIDs and iodinated intravenous contrast exposure unless the latter is absolutely indicated.  Prefer narcotic agents for pain control or hydromorphone, fentanyl, and methadone.  Morphine should not be used.  Avoid baclofen and avoid oral sodium phosphate and magnesium citrate based laxative/bowel preps.  Maintain strict Input and Output monitoring including daily standing weights if feasible. Will continue to monitor clinically with labs and daily exams and intervene as indicated.  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

## 2020-07-01 NOTE — Progress Notes (Addendum)
ANTICOAGULATION CONSULT NOTE - Follow Up Consult  Pharmacy Consult for heparin Indication: atrial fibrillation  Allergies  Allergen Reactions   Penicillins Other (See Comments)    Childhood reaction.    Patient Measurements: Height: 5\' 3"  (160 cm) Weight: 61.2 kg (135 lb) IBW/kg (Calculated) : 52.4  Vital Signs: Temp: 98.3 F (36.8 C) (01/21 0800) Temp Source: Oral (01/21 0800) BP: 94/63 (01/21 1200) Pulse Rate: 100 (01/21 1200)  Labs: Recent Labs    06/30/20 1508 07/01/20 0700 07/01/20 1120  HGB 9.8* 9.2*  --   HCT 30.2* 29.2*  --   PLT 277 207  --   APTT  --   --  54*  HEPARINUNFRC  --   --  >2.20*  CREATININE 6.34* 6.24*  --     Estimated Creatinine Clearance: 7.2 mL/min (A) (by C-G formula based on SCr of 6.24 mg/dL (H)).   Medical History: Past Medical History:  Diagnosis Date   AAA (abdominal aortic aneurysm) ([place]) 06/2019   Multiple small pseudoaneurysmal projections of the dominant aorta.  Distal abdominal aortic aneurysm 4.5 x 4.7 cm.  Greatest AP dimension of the infrarenal aorta is 4.9.  No evidence of thoracic aortic aneurysm or dissection.  Brief segment of proximal IMA occlusion.   CKD (chronic kidney disease) stage 3a, GFR 30-59 ml/min    Coronary artery calcification seen on CAT scan 06/2019   Diabetes mellitus without complication (HCC)    Type 2   Heavy smoker (more than 20 cigarettes per day)    ~ 2 ppd; since age 35 (70 pk yr)   Hyperlipidemia    Mixed   Hypertension    LEFTATRIAL MYXOMA 06/2019   Seen on CTA Chest-Abd-Pelvis: Large geographic filling defect of LA associated with desnse radioopaque material. -> TTE 07/24/19: Large size ill-defined echodensity in the left atrium suspicious for thrombus versus tumor.  Moderately dilated LA.--> CMR: Large mobile mass in the LA attached to the IAS-does not appear to be thrombus, characteristics consistent with LEFT ATRIAL MYXOMA   Microscopic hematuria    Plantar wart of right foot      Assessment: 70yo female went to PCP for N/V and found to have "abnormal labs" and sent to ED, found to be w/ AKI (baseline SCr 1.2, now >6), to transition from Eliquis for Afib to UFH; last dose of Eliquis taken 1/20 10a.  Currently on IV heparin at 900 units/hr. APTT is mildly subtherapeutic. Heparin level does not correlate due to apixaban effect. H/H low stable, Plt wnl.  Goal of Therapy:  Heparin level 0.3-0.7 units/ml aPTT 66-102 seconds Monitor platelets by anticoagulation protocol: Yes   Plan:  -Increase IV heparin to 1000 units/hr and f/u 8 hr aPTT -Monitor for s/s of bleeding   Albertina Parr, PharmD., BCPS, BCCCP Clinical Pharmacist Please refer to [facility] for unit-specific pharmacist

## 2020-07-02 ENCOUNTER — Inpatient Hospital Stay (HOSPITAL_COMMUNITY): Payer: Medicare Other

## 2020-07-02 DIAGNOSIS — I70261 Atherosclerosis of native arteries of extremities with gangrene, right leg: Secondary | ICD-10-CM | POA: Diagnosis not present

## 2020-07-02 DIAGNOSIS — D151 Benign neoplasm of heart: Secondary | ICD-10-CM | POA: Diagnosis not present

## 2020-07-02 DIAGNOSIS — N179 Acute kidney failure, unspecified: Secondary | ICD-10-CM

## 2020-07-02 DIAGNOSIS — E1129 Type 2 diabetes mellitus with other diabetic kidney complication: Secondary | ICD-10-CM | POA: Diagnosis not present

## 2020-07-02 DIAGNOSIS — I714 Abdominal aortic aneurysm, without rupture: Secondary | ICD-10-CM | POA: Diagnosis not present

## 2020-07-02 LAB — URINALYSIS, ROUTINE W REFLEX MICROSCOPIC
Bilirubin Urine: NEGATIVE
Glucose, UA: >=500 mg/dL — AB
Hgb urine dipstick: NEGATIVE
Ketones, ur: NEGATIVE mg/dL
Nitrite: NEGATIVE
Protein, ur: NEGATIVE mg/dL
Specific Gravity, Urine: 1.011 (ref 1.005–1.030)
pH: 5 (ref 5.0–8.0)

## 2020-07-02 LAB — RENAL FUNCTION PANEL
Albumin: 2.5 g/dL — ABNORMAL LOW (ref 3.5–5.0)
Anion gap: 13 (ref 5–15)
BUN: 86 mg/dL — ABNORMAL HIGH (ref 8–23)
CO2: 18 mmol/L — ABNORMAL LOW (ref 22–32)
Calcium: 8.4 mg/dL — ABNORMAL LOW (ref 8.9–10.3)
Chloride: 107 mmol/L (ref 98–111)
Creatinine, Ser: 6.3 mg/dL — ABNORMAL HIGH (ref 0.44–1.00)
GFR, Estimated: 7 mL/min — ABNORMAL LOW (ref >60)
Glucose, Bld: 98 mg/dL (ref 70–99)
Phosphorus: 6.9 mg/dL — ABNORMAL HIGH (ref 2.5–4.6)
Potassium: 4.6 mmol/L (ref 3.5–5.1)
Sodium: 138 mmol/L (ref 135–145)

## 2020-07-02 LAB — CBC
HCT: 24.4 % — ABNORMAL LOW (ref 36.0–46.0)
Hemoglobin: 8.1 g/dL — ABNORMAL LOW (ref 12.0–15.0)
MCH: 33.2 pg (ref 26.0–34.0)
MCHC: 33.2 g/dL (ref 30.0–36.0)
MCV: 100 fL (ref 80.0–100.0)
Platelets: 161 10*3/uL (ref 150–400)
RBC: 2.44 MIL/uL — ABNORMAL LOW (ref 3.87–5.11)
RDW: 15.3 % (ref 11.5–15.5)
WBC: 8.6 10*3/uL (ref 4.0–10.5)
nRBC: 0 % (ref 0.0–0.2)

## 2020-07-02 LAB — GLUCOSE, CAPILLARY
Glucose-Capillary: 85 mg/dL (ref 70–99)
Glucose-Capillary: 100 mg/dL — ABNORMAL HIGH (ref 70–99)
Glucose-Capillary: 65 mg/dL — ABNORMAL LOW (ref 70–99)
Glucose-Capillary: 125 mg/dL — ABNORMAL HIGH (ref 70–99)

## 2020-07-02 LAB — PROTEIN / CREATININE RATIO, URINE
Creatinine, Urine: 86.55 mg/dL
Protein Creatinine Ratio: 0.5 mg/mg{Cre} — ABNORMAL HIGH (ref 0.00–0.15)
Total Protein, Urine: 43 mg/dL

## 2020-07-02 LAB — HEPARIN LEVEL (UNFRACTIONATED): Heparin Unfractionated: >2.2 IU/mL — ABNORMAL HIGH (ref 0.30–0.70)

## 2020-07-02 LAB — APTT: aPTT: 77 seconds — ABNORMAL HIGH (ref 24–36)

## 2020-07-02 NOTE — Progress Notes (Signed)
Renal artery duplex study completed.   Please see CV Proc for preliminary results.   Shariya Gaster, RDMS  

## 2020-07-02 NOTE — Progress Notes (Signed)
ANTICOAGULATION CONSULT NOTE - Follow Up Consult  Pharmacy Consult for heparin Indication: atrial fibrillation  Labs: Recent Labs    06/30/20 1508 07/01/20 0700 07/01/20 1120 07/01/20 2254  HGB 9.8* 9.2*  --   --   HCT 30.2* 29.2*  --   --   PLT 277 207  --   --   APTT  --   --  54* 85*  HEPARINUNFRC  --   --  >2.20*  --   CREATININE 6.34* 6.24*  --   --     Assessment/Plan:  70yo female therapeutic on heparin after rate change. Will continue gtt at current rate and confirm stable with additional PTT.   [name], PharmD, BCPS  07/02/2020,12:30 AM

## 2020-07-02 NOTE — Progress Notes (Signed)
   VASCULAR SURGERY ASSESSMENT & PLAN:   EMBOLIC DISEASE TO BOTH LOWER EXTREMITIES: This patient underwent excision of a left atrial myxoma at [facility] in late December.  Her postoperative course was complicated by worsening renal function and lower extremity ischemic changes.  Her worsening renal function was attributed to atheroembolic disease.  She was admitted with worsening renal function.  She has evidence of infrainguinal arterial occlusive disease bilaterally and ideally would like an arteriogram, however given her renal insufficiency with a GFR of 7 and a creatinine of 6.3 we will certainly have to hold off on that.  Her noninvasive studies show monophasic Doppler signals in both feet with an ABI of 60% bilaterally.  We could consider CO2 arteriography however the imaging is not as helpful.  Vascular will continue to follow.   SUBJECTIVE:   No specific complaints this morning.  PHYSICAL EXAM:   Vitals:   07/01/20 1640 07/01/20 1823 07/01/20 2029 07/02/20 0400  BP: 119/71 (!) 112/54 110/68 117/72  Pulse: 100 85 88 84  Resp: 15 18 16 16   Temp:  98.6 F (37 C) 98.2 F (36.8 C) 98.6 F (37 C)  TempSrc:  Oral Oral Oral  SpO2: 98% 98% 94% 96%  Weight:      Height:       No change in the discoloration of her toes and dry gangrene of the right second toe     LABS:   ARTERIAL DOPPLER STUDY: I have independently interpreted her arterial Doppler study yesterday.  On the right side she has a monophasic dorsalis pedis and posterior tibial signal with an ABI of 60%.  On the left side she has a monophasic dorsalis pedis and posterior tibial signal with an ABI of 61%.  Lab Results  Component Value Date   WBC 8.6 07/02/2020   HGB 8.1 (L) 07/02/2020   HCT 24.4 (L) 07/02/2020   MCV 100.0 07/02/2020   PLT 161 07/02/2020   Lab Results  Component Value Date   CREATININE 6.30 (H) 07/02/2020   CBG (last 3)  Recent Labs    07/01/20 1840 07/01/20 2125  07/02/20 0640  GLUCAP 115* 134* 85    PROBLEM LIST:    Principal Problem:   ARF (acute renal failure) (HCC) Active Problems:   Left atrial myxoma   AAA (abdominal aortic aneurysm) without rupture (HCC)   PAF (paroxysmal atrial fibrillation) (HCC)   Anemia   Essential hypertension   DM (diabetes mellitus), type 2 with renal complications (HCC)   CURRENT MEDS:   . amiodarone  200 mg Oral Daily  . aspirin EC  81 mg Oral Daily  . atorvastatin  20 mg Oral q1800  . carvedilol  12.5 mg Oral BID WC  . folic acid  4,287 mcg Oral Daily  . insulin aspart  0-6 Units Subcutaneous TID WC  . sodium bicarbonate  1,300 mg Oral BID    Deitra Mayo Office: [phone] 07/02/2020

## 2020-07-02 NOTE — Progress Notes (Signed)
PROGRESS NOTE    Kathryn Hancock  NID:[id number] DOB: 01-17-1951 DOA: 06/30/2020 PCP: Gala Lewandowsky, MD   Brief Narrative:  Kathryn Hancock is a 70 y.o. female with history of left atrial myxoma, diabetes mellitus type 2, hypertension, chronic kidney disease stage III who had a left atrial myxoma resection in third week of December last month at [facility] during which patient had worsening renal function and lower extremity ischemic change and postoperative A. fib.  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  Was instructed to repeat and the repeat on showed worsening and was advised to come to the ER.  Patient states over the last 2 weeks patient has been worsening nausea vomiting unable to keep anything but denies any abdominal pain or diarrhea.  ED Course: In the ER patient is afebrile and not hypoxic.  Labs show creatinine of 6.3 hemoglobin 9.8 WBC 13 potassium 5.8.  EKG shows A. fib rate controlled.  ER physician discussed with on-call nephrologist Dr. Marval Regal who advised to give fluid for which patient was given fluid bolus and admitted for further observation.  COVID test is pending.   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  CT renal study: Negative for hydronephrosis/obstruction. -Continue with gentle hydration.  Consulted nephrology-appreciate help -No improvement in renal function this morning with creatinine of 6.30 and GFR of 7.  Potassium: WNL. -Renal artery duplex ultrasound is pending. -Avoid nephrotoxic medication.  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 -Reviewed ABI -Vascular on board-appreciate help.  Nausea/vomiting: Could be secondary from uremia.  Benign abdominal exam.  CT renal studies shows no obstruction/hydronephrosis/pyelonephritis. -Continue Zofran as needed for nausea and vomiting.  Hyperkalemia:  -In the setting of worsening kidney function.   -Resolved after Lokelma  Paroxysmal A. fib: Rate control -Continue Coreg and amiodarone.  Hold Eliquis in the setting of worsening kidney function.  Continue heparin as per pharmacy.  Hypertension: Stable.  Continue amlodipine and Coreg  Type 2 diabetes mellitus: Well controlled.  A1c 5.6% -We will discontinue sliding scale insulin as patient had hypoglycemic episode   Anemia of chronic disease:  -Macrocytosis.  H&H is currently stable.  Continue to monitor  Recent CABG for left atrial myxoma at [facility]: -In December 2021.  Patient denies an chest pain or shortness of breath.  Continue to monitor -Continue aspirin, statin  History of abdominal aortic aneurysm: Reviewed CT renal study showed unchanged from prior exam back to January 15/2021.  Leukocytosis: Resolved  Tobacco abuse: Smokes 1 pack of cigarettes per day-tells me that she is working on cessation.  Cuts back from 2 packs to 1 pack.  DVT prophylaxis: Heparin Code Status: Full code Family Communication:  None present at bedside.  Plan of care discussed with patient in length and she verbalized understanding and agreed with it. Disposition Plan: To be determined  Consultants:   Nephrology  Vascular surgery  Procedures:   CT renal study  Antimicrobials:   None  Status is: Inpatient  Dispo: The patient is from: Home              Anticipated  d/c is to: Home              Anticipated d/c date is: 2 days              Patient currently is not medically stable to d/c.     Subjective: Patient seen and examined.  Tells me that she was nauseous this morning however she did not vomit.  She denies chest pain, shortness of breath, leg swelling, headache, dizziness, lightheadedness.  Her feet pain is still there.  Remained afebrile.  No acute events overnight.  Objective: Vitals:   07/01/20 1640 07/01/20 1823 07/01/20 2029 07/02/20 0400  BP: 119/71 (!) 112/54 110/68 117/72  Pulse: 100 85 88 84  Resp: 15 18 16 16   Temp:  98.6 F (37 C) 98.2 F (36.8 C) 98.6 F (37 C)  TempSrc:  Oral Oral Oral  SpO2: 98% 98% 94% 96%  Weight:      Height:        Intake/Output Summary (Last 24 hours) at 07/02/2020 1016 Last data filed at 07/02/2020 0800 Gross per 24 hour  Intake 1518.64 ml  Output 0 ml  Net 1518.64 ml   Filed Weights   06/30/20 1454  Weight: 61.2 kg    Examination:  General exam: Appears calm and comfortable, on room air, communicating well, appears dehydrated and weak Respiratory system: Clear to auscultation. Respiratory effort normal. Cardiovascular system: S1 & S2 heard, RRR. No JVD, murmurs, rubs, gallops or clicks. No pedal edema.  Midsternal staples noted.  No signs of active bleeding or discharge seen. Gastrointestinal system: Abdomen is nondistended, soft and nontender. No organomegaly or masses felt. Normal bowel sounds heard. Central nervous system: Alert and oriented. No focal neurological deficits. Extremities:        Psychiatry: Judgement and insight appear normal. Mood & affect appropriate.    Data Reviewed: I have personally reviewed following labs and imaging studies  CBC: Recent Labs  Lab 06/30/20 1508 07/01/20 0700 07/02/20 0434  WBC 13.0* 9.6 8.6  HGB 9.8* 9.2* 8.1*  HCT 30.2* 29.2* 24.4*  MCV 100.3* 101.4* 100.0  PLT 277 207 034   Basic Metabolic Panel: Recent Labs  Lab  06/30/20 1508 07/01/20 0700 07/02/20 0434  NA 132* 134* 138  K 5.8* 4.9 4.6  CL 99 104 107  CO2 18* 15* 18*  GLUCOSE 218* 71 98  BUN 87* 88* 86*  CREATININE 6.34* 6.24* 6.30*  CALCIUM 9.2 8.9 8.4*  PHOS  --   --  6.9*   GFR: Estimated Creatinine Clearance: 7.2 mL/min (A) (by C-G formula based on SCr of 6.3 mg/dL (H)). Liver Function Tests: Recent Labs  Lab 06/30/20 1508 07/01/20 0700 07/02/20 0434  AST 12* 13*  --   ALT 22 19  --   ALKPHOS 170* 144*  --   BILITOT 0.8 0.8  --   PROT 6.9 6.3*  --   ALBUMIN 3.1* 2.9* 2.5*   Recent Labs  Lab 06/30/20 1508  LIPASE 32   No results for input(s): AMMONIA in the last 168 hours. Coagulation Profile: No results for input(s): INR, PROTIME in the last 168 hours. Cardiac Enzymes: No results for input(s): CKTOTAL, CKMB, CKMBINDEX, TROPONINI in the last 168 hours. BNP (last 3 results) No results for input(s): PROBNP in the last 8760 hours. HbA1C: Recent Labs  07/01/20 0700  HGBA1C 5.6   CBG: Recent Labs  Lab 07/01/20 0951 07/01/20 1116 07/01/20 1840 07/01/20 2125 07/02/20 0640  GLUCAP 190* 126* 115* 134* 85   Lipid Profile: No results for input(s): CHOL, HDL, LDLCALC, TRIG, CHOLHDL, LDLDIRECT in the last 72 hours. Thyroid Function Tests: No results for input(s): TSH, T4TOTAL, FREET4, T3FREE, THYROIDAB in the last 72 hours. Anemia Panel: No results for input(s): VITAMINB12, FOLATE, FERRITIN, TIBC, IRON, RETICCTPCT in the last 72 hours. Sepsis Labs: No results for input(s): PROCALCITON, LATICACIDVEN in the last 168 hours.  Recent Results (from the past 240 hour(s))  SARS CORONAVIRUS 2 (TAT 6-24 HRS) Nasopharyngeal Nasopharyngeal Swab     Status: None   Collection Time: 07/01/20  5:46 AM   Specimen: Nasopharyngeal Swab  Result Value Ref Range Status   SARS Coronavirus 2 NEGATIVE NEGATIVE Final    Comment: (NOTE) SARS-CoV-2 target nucleic acids are NOT DETECTED.  The SARS-CoV-2 RNA is generally detectable in  upper and lower respiratory specimens during the acute phase of infection. Negative results do not preclude SARS-CoV-2 infection, do not rule out co-infections with other pathogens, and should not be used as the sole basis for treatment or other patient management decisions. Negative results must be combined with clinical observations, patient history, and epidemiological information. The expected result is Negative.  Fact Sheet for Patients: SugarRoll.be  Fact Sheet for Healthcare Providers: https://www.woods-mathews.com/  This test is not yet approved or cleared by the [place] FDA and  has been authorized for detection and/or diagnosis of SARS-CoV-2 by FDA under an Emergency Use Authorization (EUA). This EUA will remain  in effect (meaning this test can be used) for the duration of the COVID-19 declaration under Se ction 564(b)(1) of the Act, 21 U.S.C. section 360bbb-3(b)(1), unless the authorization is terminated or revoked sooner.  Performed at [facility], [place] [address] [place], [place] [address]       Radiology Studies: DG CHEST PORT 1 VIEW  Result Date: 07/01/2020 CLINICAL DATA:  Vomiting EXAM: PORTABLE CHEST 1 VIEW COMPARISON:  June 26, 2019 FINDINGS: The patient is status post prior median sternotomy. The heart size is enlarged but stable. There is no pneumothorax. No large pleural effusion. Aortic calcifications are noted. There is no acute osseous abnormality. There is a rounded density projecting over the left lower lung zone. IMPRESSION: 1. No acute cardiopulmonary process. 2. Rounded density projecting over the left lower lung zone is likely a nipple shadow. However, a follow-up two-view chest x-ray in 4-6 weeks is recommended to confirm stability or resolution of this finding. Electronically Signed   By: Constance Holster M.D.   On: 07/01/2020 06:14   VAS [place] ABI WITH/WO TBI  Result Date: 07/01/2020 LOWER  EXTREMITY DOPPLER STUDY Indications: Gangrene, and peripheral artery disease. High Risk Factors: Hypertension, Diabetes, current smoker.  Comparison Study: No previous exam Performing Technologist: Vonzell Schlatter RVT  Examination Guidelines: A complete evaluation includes at minimum, Doppler waveform signals and systolic blood pressure reading at the level of bilateral brachial, anterior tibial, and posterior tibial arteries, when vessel segments are accessible. Bilateral testing is considered an integral part of a complete examination. Photoelectric Plethysmograph (PPG) waveforms and toe systolic pressure readings are included as required and additional duplex testing as needed. Limited examinations for reoccurring indications may be performed as noted.  ABI Findings: +--------+------------------+-----+----------+--------+ Right   Rt Pressure (mmHg)IndexWaveform  Comment  +--------+------------------+-----+----------+--------+ [id number]                                       +--------+------------------+-----+----------+--------+  PTA     65                0.60 monophasic         +--------+------------------+-----+----------+--------+ DP      65                0.60 monophasic         +--------+------------------+-----+----------+--------+ +--------+------------------+-----+----------+-------+ Left    Lt Pressure (mmHg)IndexWaveform  Comment +--------+------------------+-----+----------+-------+ [id number]                                      +--------+------------------+-----+----------+-------+ PTA     66                0.61 monophasic        +--------+------------------+-----+----------+-------+ DP      58                0.53 monophasic        +--------+------------------+-----+----------+-------+  Summary: Right: Resting right ankle-brachial index indicates moderate right lower extremity arterial disease. Left: Resting left ankle-brachial index indicates moderate  left lower extremity arterial disease.  *See table(s) above for measurements and observations.  Electronically signed by Deitra Mayo MD on 07/01/2020 at 6:21:05 PM.   Final    CT RENAL STONE STUDY  Result Date: 07/01/2020 CLINICAL DATA:  Flank pain EXAM: CT ABDOMEN AND PELVIS WITHOUT CONTRAST TECHNIQUE: Multidetector CT imaging of the abdomen and pelvis was performed following the standard protocol without IV contrast. COMPARISON:  None. FINDINGS: Lower chest: The visualized heart size within normal limits. No pericardial fluid/thickening. No hiatal hernia. There is patchy airspace consolidation seen at the posterior right lung base Hepatobiliary: Although limited due to the lack of intravenous contrast, normal in appearance without gross focal abnormality. Scattered calcified gallstones are present. There are 2 layering within the gallbladder fundus the largest measuring 9 mm. Pancreas:  Unremarkable.  No surrounding inflammatory changes. Spleen: Normal in size. Although limited due to the lack of intravenous contrast, normal in appearance. Adrenals/Urinary Tract: Both adrenal glands appear normal. There is a punctate nonobstructing renal calculi in the upper pole of the right kidney. No left-sided renal or collecting system calculi are noted. Bladder is unremarkable. Stomach/Bowel: The stomach, small bowel, and colon are normal in appearance. No inflammatory changes or obstructive findings. Scattered colonic diverticula are noted. Appendix is normal. Vascular/Lymphatic: There are no enlarged abdominal or pelvic lymph nodes. 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 The maximum dimension of the aorta with focal outpouching/pseudoaneurysm at the level of L3 measures 4.9 cm. The infrarenal abdominal aorta measures maximum diameter 4.7 cm which is also unchanged from the prior exam.  There is scattered atherosclerosis seen throughout. Reproductive: For Other: No evidence of abdominal wall mass or hernia. Musculoskeletal: No acute or significant osseous findings. IMPRESSION: 1. Patchy airspace consolidation at the posterior right lung base which could be due to atelectasis or infectious etiology. 2. Nonobstructing punctate right renal calculi. 3. Cholelithiasis 4. 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 This is unchanged from prior exam dating back to June 26, 2019. 5. Aortic Atherosclerosis (ICD10-I70.0). Electronically Signed   By: Prudencio Pair M.D.   On: 07/01/2020 02:05    Scheduled Meds: . amiodarone  200 mg Oral Daily  . aspirin EC  81 mg Oral Daily  . atorvastatin  20 mg Oral q1800  . carvedilol  12.5 mg Oral BID WC  . folic acid  6,967 mcg Oral Daily  . insulin aspart  0-6 Units Subcutaneous TID WC  . sodium bicarbonate  1,300 mg Oral BID   Continuous Infusions: . sodium chloride 1,000 mL (07/01/20 1300)  . heparin 1,000 Units/hr (07/01/20 1412)     LOS: 1 day   Time spent: 40 minutes   Tacora Athanas Loann Quill, MD [facility]  If 7PM-7AM, please contact night-coverage [email or web address] 07/02/2020, 10:16 AM

## 2020-07-02 NOTE — Progress Notes (Signed)
ANTICOAGULATION CONSULT NOTE - Follow Up Consult  Pharmacy Consult for heparin Indication: atrial fibrillation  Allergies  Allergen Reactions  . Penicillins Other (See Comments)    Childhood reaction.    Patient Measurements: Height: 5\' 3"  (160 cm) Weight: 61.2 kg (135 lb) IBW/kg (Calculated) : 52.4  Vital Signs: Temp: 98.6 F (37 C) (01/22 0400) Temp Source: Oral (01/22 0400) BP: 117/72 (01/22 0400) Pulse Rate: 84 (01/22 0400)  Labs: Recent Labs    06/30/20 1508 07/01/20 0700 07/01/20 1120 07/01/20 2254 07/02/20 0434  HGB 9.8* 9.2*  --   --  8.1*  HCT 30.2* 29.2*  --   --  24.4*  PLT 277 207  --   --  161  APTT  --   --  54* 85* 77*  HEPARINUNFRC  --   --  >2.20*  --  >2.20*  CREATININE 6.34* 6.24*  --   --  6.30*    Estimated Creatinine Clearance: 7.2 mL/min (A) (by C-G formula based on SCr of 6.3 mg/dL (H)).   Medications:  Medications Prior to Admission  Medication Sig Dispense Refill Last Dose  . acetaminophen (TYLENOL) 500 MG tablet Take 500-1,000 mg by mouth every 6 (six) hours as needed (for pain.).   06/29/2020  . amiodarone (PACERONE) 200 MG tablet Take 200 mg by mouth daily.   06/30/2020 at Unknown time  . amLODipine (NORVASC) 5 MG tablet Take 5 mg by mouth daily.   06/30/2020 at Unknown time  . apixaban (ELIQUIS) 5 MG TABS tablet Take 1 tablet (5 mg total) by mouth 2 (two) times daily. 60 tablet 6 06/30/2020 at 1000  . Ascorbic Acid (VITAMIN C) 1000 MG tablet Take 1,000 mg by mouth every evening.   06/29/2020  . aspirin EC 81 MG tablet Take 81 mg by mouth daily. Swallow whole.   06/30/2020 at Unknown time  . atorvastatin (LIPITOR) 20 MG tablet Take 20 mg by mouth daily.   06/30/2020 at Unknown time  . carvedilol (COREG) 12.5 MG tablet Take 12.5 mg by mouth 2 (two) times daily with a meal.   06/30/2020 at 1000  . fluticasone (FLONASE) 50 MCG/ACT nasal spray Place 2 sprays into both nostrils as needed for allergies or rhinitis.   unk  . folic acid (FOLVITE) 161  MCG tablet Take 800 mcg by mouth daily.   06/30/2020 at Unknown time  . furosemide (LASIX) 20 MG tablet Take 20 mg by mouth in the morning and at bedtime.   06/30/2020 at Unknown time  . glimepiride (AMARYL) 1 MG tablet Take 1 mg by mouth in the morning and at bedtime. Hold if bg less than 120   06/30/2020 at Unknown time  . glucose blood test strip 1 each by Other route as needed. Use as instructed     . montelukast (SINGULAIR) 10 MG tablet Take 10 mg by mouth daily as needed (allergies.).   unk  . oxyCODONE (OXY IR/ROXICODONE) 5 MG immediate release tablet Take 5-10 mg by mouth every 6 (six) hours as needed for moderate pain.   Past Week at Unknown time  . Vitamin D3 (VITAMIN D) 25 MCG tablet Take 2,000 Units by mouth every evening.   06/29/2020    Assessment: 70 yo female went to PCP for N/V and found to have "abnormal labs" and sent to ED, found to be w/ AKI (baseline Scr 1.2, now >6), to transition from Eliquis for Afib to UFH; last dose of Eliquis taken 1/20 10a.  Currently on IV  heparin at 1000 units/hour.  APTT is therapeutic.  Heparin level does not correlate due to apixaban effect.  H/H low stable. Plt wnl.  No issues with bleeding or line running per RN.    Goal of Therapy:  Heparin level 0.3-0.7 units/ml  APTT 66-102 seconds Monitor platelets by anticoagulation protocol: Yes   Plan:  Continue IV heparin at 1000 units/hour Check daily aPTT Monitor s/s bleeding  Dimple Nanas 07/02/2020,7:40 AM

## 2020-07-02 NOTE — Progress Notes (Signed)
[facility] Progress Note   Assessment/ Plan:   1.  Acute kidney injury on CKD  - Notable labs creatinine 6.24>6.3, BUN 88 > 86 .K 4.6 , CO2 18 - Patient reports making urine last night , but did not collect. RN bedside and we discussed recording outs with patient. -Work-up thus far: CT renal stone study  ruled out obstruction 1/21.  - Pending UA (RN aware and waiting on patient to make urine) and Vas US Renal Artery Duplex  - Renal function stable with IV hydration overnight. NAGMA improved with sodium bicarb. Hyperkalemia is not a problem today.Nausea with eating and from story I expect possible gastroparesis, however can not say this not from uremia. No other uremic symptoms. Do not see need for starting RRT today.  - Recommend gentle IV hydration - Continue sodium bicarbonate  - General AKI recommendations : Avoid nephrotoxins including NSAIDs and iodinated intravenous contrast exposure unless the latter is absolutely indicated.  Prefer narcotic agents for pain control or hydromorphone, fentanyl, and methadone.  Morphine should not be used.  Avoid baclofen and avoid oral sodium phosphate and magnesium citrate based laxative/bowel preps.  Maintain strict Input and Output monitoring including daily standing weights if feasible. Will continue to monitor clinically with labs and daily exams and intervene as indicated. 2. metabolic acidosis - as mentioned above , improved as expected with sodium bicarbonate 3. 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 6. PAF - on amiodarone and coreg  - Heparin GTT per pharmacy  Subjective:   Kathryn Hancock is a 70 y.o. with PMH of HTN,T2DM, tobacco use disorder, left atrial myxoma surgery in 05/2020 at [facility] , AAA who developed acute  kidney injury due to atheroembolic disease who was admitted for worsening renal function. On hospital day 1   Patient reports feeling nauseas half way through her breakfast. Before eating she had no nausea. She has had decrease appetite for one year and in the last two months started to experience nausea with eating. Denies difficulty swallowing solid or liquids. She had some urine output last night with a bowel movement. Otherwise she has no acute concerns. All questions and concerns addressed.  Objective:   BP 117/72 (BP Location: Right Arm)   Pulse 84   Temp 98.6 F (37 C) (Oral)   Resp 16   Ht 5\' 3"  (1.6 m)   Wt 61.2 kg   SpO2 96%   BMI 23.91 kg/m   Physical Exam: Gen: NAD, elderly female in hospital bed AQT:[id number] irregular pulse, normal rate, no murmurs Resp: CTAB, no wheezing or rales TGY:BWLS, NT, active bowel sounds  Ext: Ischemic changes in both feet  Labs: BMET Recent Labs  Lab 06/30/20 1508 07/01/20 0700 07/02/20 0434  NA 132* 134* 138  K 5.8* 4.9 4.6  CL 99 104 107  CO2 18* 15* 18*  GLUCOSE 218* 71 98  BUN 87* 88* 86*  CREATININE 6.34* 6.24* 6.30*  CALCIUM 9.2 8.9 8.4*  PHOS  --   --  6.9*   CBC Recent Labs  Lab 06/30/20 1508 07/01/20 0700 07/02/20 0434  WBC 13.0* 9.6 8.6  HGB 9.8* 9.2* 8.1*  HCT 30.2* 29.2* 24.4*  MCV 100.3* 101.4* 100.0  PLT 277 207 161      Medications:    .  amiodarone  200 mg Oral Daily  . aspirin EC  81 mg Oral Daily  . atorvastatin  20 mg Oral q1800  . carvedilol  12.5 mg Oral BID WC  . folic acid  3,601 mcg Oral Daily  . insulin aspart  0-6 Units Subcutaneous TID WC  . sodium bicarbonate  1,300 mg Oral BID     Tamsen Snider, MD PGY2 IM 07/02/2020, 7:49 AM

## 2020-07-03 DIAGNOSIS — E1129 Type 2 diabetes mellitus with other diabetic kidney complication: Secondary | ICD-10-CM | POA: Diagnosis not present

## 2020-07-03 DIAGNOSIS — I70261 Atherosclerosis of native arteries of extremities with gangrene, right leg: Secondary | ICD-10-CM | POA: Diagnosis not present

## 2020-07-03 DIAGNOSIS — N179 Acute kidney failure, unspecified: Secondary | ICD-10-CM | POA: Diagnosis not present

## 2020-07-03 DIAGNOSIS — I714 Abdominal aortic aneurysm, without rupture: Secondary | ICD-10-CM | POA: Diagnosis not present

## 2020-07-03 DIAGNOSIS — I480 Paroxysmal atrial fibrillation: Secondary | ICD-10-CM | POA: Diagnosis not present

## 2020-07-03 LAB — CBC
HCT: 24.5 % — ABNORMAL LOW (ref 36.0–46.0)
Hemoglobin: 7.5 g/dL — ABNORMAL LOW (ref 12.0–15.0)
MCH: 31.6 pg (ref 26.0–34.0)
MCHC: 30.6 g/dL (ref 30.0–36.0)
MCV: 103.4 fL — ABNORMAL HIGH (ref 80.0–100.0)
Platelets: 162 10*3/uL (ref 150–400)
RBC: 2.37 MIL/uL — ABNORMAL LOW (ref 3.87–5.11)
RDW: 15.6 % — ABNORMAL HIGH (ref 11.5–15.5)
WBC: 7.9 10*3/uL (ref 4.0–10.5)
nRBC: 0 % (ref 0.0–0.2)

## 2020-07-03 LAB — RENAL FUNCTION PANEL
Albumin: 2.5 g/dL — ABNORMAL LOW (ref 3.5–5.0)
Anion gap: 14 (ref 5–15)
BUN: 79 mg/dL — ABNORMAL HIGH (ref 8–23)
CO2: 14 mmol/L — ABNORMAL LOW (ref 22–32)
Calcium: 8.4 mg/dL — ABNORMAL LOW (ref 8.9–10.3)
Chloride: 110 mmol/L (ref 98–111)
Creatinine, Ser: 5.92 mg/dL — ABNORMAL HIGH (ref 0.44–1.00)
GFR, Estimated: 7 mL/min — ABNORMAL LOW (ref >60)
Glucose, Bld: 69 mg/dL — ABNORMAL LOW (ref 70–99)
Phosphorus: 6.2 mg/dL — ABNORMAL HIGH (ref 2.5–4.6)
Potassium: 4.9 mmol/L (ref 3.5–5.1)
Sodium: 138 mmol/L (ref 135–145)

## 2020-07-03 LAB — GLUCOSE, CAPILLARY
Glucose-Capillary: 90 mg/dL (ref 70–99)
Glucose-Capillary: 89 mg/dL (ref 70–99)
Glucose-Capillary: 101 mg/dL — ABNORMAL HIGH (ref 70–99)
Glucose-Capillary: 112 mg/dL — ABNORMAL HIGH (ref 70–99)

## 2020-07-03 LAB — HEPARIN LEVEL (UNFRACTIONATED): Heparin Unfractionated: 1.98 IU/mL — ABNORMAL HIGH (ref 0.30–0.70)

## 2020-07-03 LAB — APTT: aPTT: 67 seconds — ABNORMAL HIGH (ref 24–36)

## 2020-07-03 MED ORDER — STERILE WATER FOR INJECTION IV SOLN
INTRAVENOUS | Status: AC
  Filled 2020-07-03 (×2): qty 850

## 2020-07-03 MED ORDER — ATORVASTATIN CALCIUM 40 MG PO TABS
40.0000 mg | ORAL_TABLET | Freq: Every day | ORAL | Status: DC
  Administered 2020-07-03 – 2020-07-14 (×11): 40 mg via ORAL
  Filled 2020-07-03 (×12): qty 1

## 2020-07-03 NOTE — Progress Notes (Addendum)
ANTICOAGULATION CONSULT NOTE - Follow Up Consult  Pharmacy Consult for heparin Indication: atrial fibrillation  Allergies  Allergen Reactions  . Penicillins Other (See Comments)    Childhood reaction.    Patient Measurements: Height: 5\' 3"  (160 cm) Weight: 61.2 kg (135 lb) IBW/kg (Calculated) : 52.4  Vital Signs: Temp: 98.3 F (36.8 C) (01/23 0452) Temp Source: Oral (01/22 2057) BP: 114/56 (01/23 0452) Pulse Rate: 76 (01/23 0452)  Labs: Recent Labs    07/01/20 0700 07/01/20 1120 07/01/20 1120 07/01/20 2254 07/02/20 0434 07/03/20 0320  HGB 9.2*  --   --   --  8.1* 7.5*  HCT 29.2*  --   --   --  24.4* 24.5*  PLT 207  --   --   --  161 162  APTT  --  54*   < > 85* 77* 67*  HEPARINUNFRC  --  >2.20*  --   --  >2.20* 1.98*  CREATININE 6.24*  --   --   --  6.30* 5.92*   < > = values in this interval not displayed.    Estimated Creatinine Clearance: 7.6 mL/min (A) (by C-G formula based on SCr of 5.92 mg/dL (H)).   Medications:  Medications Prior to Admission  Medication Sig Dispense Refill Last Dose  . acetaminophen (TYLENOL) 500 MG tablet Take 500-1,000 mg by mouth every 6 (six) hours as needed (for pain.).   06/29/2020  . amiodarone (PACERONE) 200 MG tablet Take 200 mg by mouth daily.   06/30/2020 at Unknown time  . amLODipine (NORVASC) 5 MG tablet Take 5 mg by mouth daily.   06/30/2020 at Unknown time  . apixaban (ELIQUIS) 5 MG TABS tablet Take 1 tablet (5 mg total) by mouth 2 (two) times daily. 60 tablet 6 06/30/2020 at 1000  . Ascorbic Acid (VITAMIN C) 1000 MG tablet Take 1,000 mg by mouth every evening.   06/29/2020  . aspirin EC 81 MG tablet Take 81 mg by mouth daily. Swallow whole.   06/30/2020 at Unknown time  . atorvastatin (LIPITOR) 20 MG tablet Take 20 mg by mouth daily.   06/30/2020 at Unknown time  . carvedilol (COREG) 12.5 MG tablet Take 12.5 mg by mouth 2 (two) times daily with a meal.   06/30/2020 at 1000  . fluticasone (FLONASE) 50 MCG/ACT nasal spray Place 2  sprays into both nostrils as needed for allergies or rhinitis.   unk  . folic acid (FOLVITE) 161 MCG tablet Take 800 mcg by mouth daily.   06/30/2020 at Unknown time  . furosemide (LASIX) 20 MG tablet Take 20 mg by mouth in the morning and at bedtime.   06/30/2020 at Unknown time  . glimepiride (AMARYL) 1 MG tablet Take 1 mg by mouth in the morning and at bedtime. Hold if bg less than 120   06/30/2020 at Unknown time  . glucose blood test strip 1 each by Other route as needed. Use as instructed     . montelukast (SINGULAIR) 10 MG tablet Take 10 mg by mouth daily as needed (allergies.).   unk  . oxyCODONE (OXY IR/ROXICODONE) 5 MG immediate release tablet Take 5-10 mg by mouth every 6 (six) hours as needed for moderate pain.   Past Week at Unknown time  . Vitamin D3 (VITAMIN D) 25 MCG tablet Take 2,000 Units by mouth every evening.   06/29/2020    Assessment: 70 yo female went to PCP for N/V and found to have "abnormal labs" and sent to ED, found  to be w/ AKI (baseline Scr 1.2, now >6), to transition from Eliquis for Afib to UFH; last dose of Eliquis taken 1/20 10a.  Currently on IV heparin at 1000 units/hour.  APTT is therapeutic at 67 but has been downtrending.  Will slightly increase (by ~2u/kg/hr) to ensure patient remains therapeutic.  Heparin level does not correlate due to apixaban effect.  H/H has been low stable (8-9's). Plt wnl.  No bleeding per RN.   Goal of Therapy:  Heparin level 0.3-0.7 units/ml  aPTT 66-102 seconds Monitor platelets by anticoagulation protocol: Yes   Plan:  Increase IV heparin to 1100 units/hour Check aPTT with AM labs (due to pt being therapeutic currently) Monitor s/s bleeding  Dimple Nanas, PharmD PGY-1 Acute Care Pharmacy Resident Office: [phone] 07/03/2020 7:26 AM

## 2020-07-03 NOTE — Progress Notes (Addendum)
PROGRESS NOTE    Kathryn Hancock  WUX:[id number] DOB: [birth year] DOA: 06/30/2020 PCP: Gala Lewandowsky, MD   Brief Narrative:  Kathryn Hancock is a 70 y.o. female with history of left atrial myxoma, diabetes mellitus type 2, hypertension, chronic kidney disease stage III who had a left atrial myxoma resection in third week of December last month at [facility] during which patient had worsening renal function and lower extremity ischemic change and postoperative A. fib.  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  Was instructed to repeat and the repeat on showed worsening and was advised to come to the ER.  Patient states over the last 2 weeks patient has been worsening nausea vomiting unable to keep anything but denies any abdominal pain or diarrhea.  ED Course: In the ER patient is afebrile and not hypoxic.  Labs show creatinine of 6.3 hemoglobin 9.8 WBC 13 potassium 5.8.  EKG shows A. fib rate controlled.  ER physician discussed with on-call nephrologist Dr. Marval Regal who advised to give fluid for which patient was given fluid bolus and admitted for further observation.  COVID test is pending.   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  CT renal study: Negative for hydronephrosis/obstruction. -Continue with gentle hydration.   -Nephrology on board-appreciate help. -No significant improvement in renal function.   Potassium: WNL. -Renal artery duplex ultrasound negative for acute findings. -Avoid nephrotoxic medication.  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 -Reviewed ABI -Vascular on board-appreciate help.  Nausea/vomiting: Could be secondary from uremia.  Benign abdominal exam.  CT renal studies shows no obstruction/hydronephrosis/pyelonephritis. -Continue Zofran as needed for nausea and vomiting.  Hyperkalemia:  -In the setting of worsening kidney function.   -Resolved after Lokelma  Paroxysmal A. fib: Rate control -Continue Coreg and amiodarone.  Hold Eliquis in the setting of worsening kidney function.  Continue heparin as per pharmacy.  Hypertension: Stable.  Continue amlodipine and Coreg  Type 2 diabetes mellitus: Well controlled.  A1c 5.6% -We will discontinue sliding scale insulin as patient had hypoglycemic episode   Anemia of chronic disease:  -Macrocytosis.  H&H is currently stable.  Continue to monitor  Recent CABG for left atrial myxoma at [facility]: -In December 2021.  Patient denies an chest pain or shortness of breath.  Continue to monitor -Continue aspirin, statin  History of abdominal aortic aneurysm: Reviewed CT renal study showed unchanged from prior exam back to January 15/2021.  Leukocytosis: Resolved  Tobacco abuse: Smokes 1 pack of cigarettes per day-tells me that she is working on cessation.  Cuts back from 2 packs to 1 pack.  DVT prophylaxis: Heparin Code Status: Full code Family Communication:  None present at bedside.  Plan of care discussed with patient in length and she verbalized understanding and agreed with it.  Discussed plan of care with patient's daughter on the phone and she verbalized understanding.  Disposition Plan: To be determined  Consultants:  Nephrology  Vascular surgery  Procedures:   CT renal study  Antimicrobials:    None  Status is: Inpatient  Dispo: The patient is from: Home              Anticipated d/c is to: Home              Anticipated d/c date is: 2 days              Patient currently is not medically stable to d/c.     Subjective: Patient seen and examined.  Tells me that she had dry heaving after breakfast this morning.  No vomiting.  No fever.  No acute events overnight.  Denies chest pain, shortness of breath, leg swelling, orthopnea, PND, abdominal pain.  Objective: Vitals:   07/02/20 1832 07/02/20 2057 07/03/20 0452 07/03/20 1006  BP: (!) 128/111 (!) 96/55 (!) 114/56 113/75  Pulse: 84 64 76 (!) 102  Resp: 16 17 17 18   Temp: 98 F (36.7 C) 99.1 F (37.3 C) 98.3 F (36.8 C) 98.6 F (37 C)  TempSrc: Oral Oral  Oral  SpO2: 94% 97% 95% 95%  Weight:      Height:        Intake/Output Summary (Last 24 hours) at 07/03/2020 1023 Last data filed at 07/03/2020 0800 Gross per 24 hour  Intake 2559.99 ml  Output 800 ml  Net 1759.99 ml   Filed Weights   06/30/20 1454  Weight: 61.2 kg    Examination:  General exam: Appears calm and comfortable, on room air, communicating well, appears dehydrated and weak Respiratory system: Clear to auscultation. Respiratory effort normal. Cardiovascular system: S1 & S2 heard, RRR. No JVD, murmurs, rubs, gallops or clicks. No pedal edema.  Midsternal staples noted.  No signs of active bleeding or discharge seen.  Nontender on palpation. Gastrointestinal system: Abdomen is nondistended, soft and nontender. No organomegaly or masses felt. Normal bowel sounds heard. Central nervous system: Alert and oriented. No focal neurological deficits. Extremities: Ischemic changes in all toes on both feet.  Right second toe: Gangrenous changes noted. Psychiatry: Judgement and insight appear normal. Mood & affect appropriate.    Data Reviewed: I have personally reviewed following labs and imaging studies  CBC: Recent Labs  Lab 06/30/20 1508  07/01/20 0700 07/02/20 0434 07/03/20 0320  WBC 13.0* 9.6 8.6 7.9  HGB 9.8* 9.2* 8.1* 7.5*  HCT 30.2* 29.2* 24.4* 24.5*  MCV 100.3* 101.4* 100.0 103.4*  PLT 277 207 161 563   Basic Metabolic Panel: Recent Labs  Lab 06/30/20 1508 07/01/20 0700 07/02/20 0434 07/03/20 0320  NA 132* 134* 138 138  K 5.8* 4.9 4.6 4.9  CL 99 104 107 110  CO2 18* 15* 18* 14*  GLUCOSE 218* 71 98 69*  BUN 87* 88* 86* 79*  CREATININE 6.34* 6.24* 6.30* 5.92*  CALCIUM 9.2 8.9 8.4* 8.4*  PHOS  --   --  6.9* 6.2*   GFR: Estimated Creatinine Clearance: 7.6 mL/min (A) (by C-G formula based on SCr of 5.92 mg/dL (H)). Liver Function Tests: Recent Labs  Lab 06/30/20 1508 07/01/20 0700 07/02/20 0434 07/03/20 0320  AST 12* 13*  --   --   ALT 22 19  --   --   ALKPHOS 170* 144*  --   --   BILITOT 0.8 0.8  --   --   PROT 6.9 6.3*  --   --   ALBUMIN 3.1* 2.9* 2.5* 2.5*   Recent Labs  Lab 06/30/20 1508  LIPASE 32   No results for input(s): AMMONIA in the last 168 hours. Coagulation Profile: No results for input(s): INR, PROTIME in the last 168 hours. Cardiac Enzymes: No results for input(s): CKTOTAL, CKMB, CKMBINDEX, TROPONINI in the last 168 hours. BNP (last 3 results) No results for input(s): PROBNP in the last 8760 hours. HbA1C: Recent Labs    07/01/20 0700  HGBA1C 5.6   CBG: Recent Labs  Lab 07/02/20 0640 07/02/20 1126 07/02/20 1702 07/02/20 2058 07/03/20 0646  GLUCAP 85 100* 65* 125* 90   Lipid Profile: No results for input(s): CHOL, HDL, LDLCALC, TRIG, CHOLHDL, LDLDIRECT in the last 72 hours. Thyroid Function Tests: No results for input(s): TSH, T4TOTAL, FREET4, T3FREE, THYROIDAB in the last 72 hours. Anemia Panel: No results for input(s): VITAMINB12, FOLATE, FERRITIN, TIBC, IRON, RETICCTPCT in the last 72 hours. Sepsis Labs: No results for input(s): PROCALCITON, LATICACIDVEN in the last 168 hours.  Recent Results (from the past 240 hour(s))  SARS CORONAVIRUS 2 (TAT 6-24  HRS) Nasopharyngeal Nasopharyngeal Swab     Status: None   Collection Time: 07/01/20  5:46 AM   Specimen: Nasopharyngeal Swab  Result Value Ref Range Status   SARS Coronavirus 2 NEGATIVE NEGATIVE Final    Comment: (NOTE) SARS-CoV-2 target nucleic acids are NOT DETECTED.  The SARS-CoV-2 RNA is generally detectable in upper and lower respiratory specimens during the acute phase of infection. Negative results do not preclude SARS-CoV-2 infection, do not rule out co-infections with other pathogens, and should not be used as the sole basis for treatment or other patient management decisions. Negative results must be combined with clinical observations, patient history, and epidemiological information. The expected result is Negative.  Fact Sheet for Patients: SugarRoll.be  Fact Sheet for Healthcare Providers: https://www.woods-mathews.com/  This test is not yet approved or cleared by the [place] FDA and  has been authorized for detection and/or diagnosis of SARS-CoV-2 by FDA under an Emergency Use Authorization (EUA). This EUA will remain  in effect (meaning this test can be used) for the duration of the COVID-19 declaration under Se ction 564(b)(1) of the Act, 21 U.S.C. section 360bbb-3(b)(1), unless the authorization is terminated or revoked sooner.  Performed at [facility], [place] [address] [place], [place] [address]       Radiology Studies: VAS [place] ABI WITH/WO TBI  Result Date: 07/01/2020 LOWER EXTREMITY DOPPLER STUDY Indications: Gangrene, and peripheral artery disease. High Risk Factors: Hypertension, Diabetes, current smoker.  Comparison Study: No previous exam Performing Technologist: Vonzell Schlatter RVT  Examination Guidelines: A complete evaluation includes at minimum, Doppler waveform signals and systolic blood pressure reading at the level of bilateral brachial, anterior tibial, and posterior tibial arteries, when vessel  segments are accessible. Bilateral testing is considered an integral part of a complete examination. Photoelectric Plethysmograph (PPG) waveforms and toe systolic pressure readings are included as required and additional duplex testing as needed. Limited examinations for reoccurring indications may be performed as noted.  ABI Findings: +--------+------------------+-----+----------+--------+ Right   Rt Pressure (mmHg)IndexWaveform  Comment  +--------+------------------+-----+----------+--------+ [id number]                                       +--------+------------------+-----+----------+--------+ PTA     65                0.60 monophasic         +--------+------------------+-----+----------+--------+ DP  65                0.60 monophasic         +--------+------------------+-----+----------+--------+ +--------+------------------+-----+----------+-------+ Left    Lt Pressure (mmHg)IndexWaveform  Comment +--------+------------------+-----+----------+-------+ [id number]                                      +--------+------------------+-----+----------+-------+ PTA     66                0.61 monophasic        +--------+------------------+-----+----------+-------+ DP      58                0.53 monophasic        +--------+------------------+-----+----------+-------+  Summary: Right: Resting right ankle-brachial index indicates moderate right lower extremity arterial disease. Left: Resting left ankle-brachial index indicates moderate left lower extremity arterial disease.  *See table(s) above for measurements and observations.  Electronically signed by Deitra Mayo MD on 07/01/2020 at 6:21:05 PM.   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 Limitations: Air/bowel gas and difficult visibility due to  prominent overlying veins. Comparison Study: No prior studies. Performing Technologist: Darlin Coco, RDMS  Examination Guidelines: A complete evaluation includes B-mode imaging, spectral Doppler, color Doppler, and power Doppler as needed of all accessible portions of each vessel. Bilateral testing is considered an integral part of a complete examination. Limited examinations for reoccurring indications may be performed as noted.  Duplex Findings: +--------------------+--------+--------+------+--------+ Mesenteric          PSV cm/sEDV cm/sPlaqueComments +--------------------+--------+--------+------+--------+ Aorta Mid              97                          +--------------------+--------+--------+------+--------+ Celiac Artery Origin  268                          +--------------------+--------+--------+------+--------+ SMA Origin            236                          +--------------------+--------+--------+------+--------+    +------------------+--------+--------+-------+ Right Renal ArteryPSV cm/sEDV cm/sComment +------------------+--------+--------+-------+ Origin               64      16           +------------------+--------+--------+-------+ Proximal             88      11           +------------------+--------+--------+-------+ Mid                  81      15           +------------------+--------+--------+-------+ Distal               62      10           +------------------+--------+--------+-------+ +-----------------+--------+--------+-------+ Left Renal ArteryPSV cm/sEDV cm/sComment +-----------------+--------+--------+-------+ Proximal            64      11           +-----------------+--------+--------+-------+ Mid  59      12           +-----------------+--------+--------+-------+ Distal              49      12           +-----------------+--------+--------+-------+  +------------+--------+--------+----+-----------+--------+--------+----+ Right KidneyPSV cm/sEDV cm/sRI  Left KidneyPSV cm/sEDV cm/sRI   +------------+--------+--------+----+-----------+--------+--------+----+ Upper Pole  19      6       0.68Upper Pole                      +------------+--------+--------+----+-----------+--------+--------+----+ Mid         23      6       0.70Mid        37      8       0.77 +------------+--------+--------+----+-----------+--------+--------+----+ Lower Pole  21      7       0.69Lower Pole 41      8       0.79 +------------+--------+--------+----+-----------+--------+--------+----+ Hilar       47      8       0.83Hilar      33      9       0.74 +------------+--------+--------+----+-----------+--------+--------+----+ +------------------+-----+------------------+-----+ Right Kidney           Left Kidney             +------------------+-----+------------------+-----+ RAR                    RAR                     +------------------+-----+------------------+-----+ RAR (manual)      0.91 RAR (manual)      0.66  +------------------+-----+------------------+-----+ Cortex                 Cortex                  +------------------+-----+------------------+-----+ Cortex thickness       Corex thickness         +------------------+-----+------------------+-----+ Kidney length (cm)10.43Kidney length (cm)10.10 +------------------+-----+------------------+-----+  Summary: Renal:  Right: No evidence of right renal artery stenosis. RRV flow present.        Normal size right kidney. Left:  No evidence of left renal artery stenosis. LRV flow present.        Normal size of left kidney. Abnormal left Resisitve Index. Mesenteric: Normal Superior Mesenteric artery findings. 70 to 99% stenosis in the celiac artery. AAA identified on previous CT appreciated on today's examination.  *See table(s) above for measurements and  observations.  Diagnosing physician: Deitra Mayo MD  Electronically signed by Deitra Mayo MD on 07/02/2020 at 5:50:27 PM.    Final     Scheduled Meds: . amiodarone  200 mg Oral Daily  . aspirin EC  81 mg Oral Daily  . atorvastatin  40 mg Oral q1800  . carvedilol  12.5 mg Oral BID WC  . folic acid  3,546 mcg Oral Daily  . sodium bicarbonate  1,300 mg Oral BID   Continuous Infusions: . heparin 1,100 Units/hr (07/03/20 0912)  .  sodium bicarbonate (isotonic) infusion in sterile water 75 mL/hr at 07/03/20 1003     LOS: 2 days   Time spent: 40 minutes   Katalyn Matin Loann Quill, MD [facility]  If 7PM-7AM, please contact night-coverage [email or web address] 07/03/2020, 10:23 AM

## 2020-07-03 NOTE — Progress Notes (Signed)
   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  At some point she will need an arteriogram however currently we are waiting for her renal function to improve.  She has monophasic Doppler signals in both feet with an ABI of 60% bilaterally.  We could consider CO2 arteriography however the imaging is not as good with this.  We will continue to follow.  Renal duplex scan shows no evidence of renal artery stenosis.  SUBJECTIVE:   No specific complaints.  PHYSICAL EXAM:   Vitals:   07/02/20 1057 07/02/20 1832 07/02/20 2057 07/03/20 0452  BP: 99/71 (!) 128/111 (!) 96/55 (!) 114/56  Pulse: 74 84 64 76  Resp: 14 16 17 17   Temp: 98.6 F (37 C) 98 F (36.7 C) 99.1 F (37.3 C) 98.3 F (36.8 C)  TempSrc: Oral Oral Oral   SpO2: 97% 94% 97% 95%  Weight:      Height:       No change to the ischemic changes to the toes on both feet.  LABS:   Lab Results  Component Value Date   WBC 7.9 07/03/2020   HGB 7.5 (L) 07/03/2020   HCT 24.5 (L) 07/03/2020   MCV 103.4 (H) 07/03/2020   PLT 162 07/03/2020   Lab Results  Component Value Date   CREATININE 5.92 (H) 07/03/2020   No results found for: INR, PROTIME CBG (last 3)  Recent Labs    07/02/20 1702 07/02/20 2058 07/03/20 0646  GLUCAP 65* 125* 90    PROBLEM LIST:    Principal Problem:   ARF (acute renal failure) (HCC) Active Problems:   Left atrial myxoma   AAA (abdominal aortic aneurysm) without rupture (HCC)   PAF (paroxysmal atrial fibrillation) (HCC)   Anemia   Essential hypertension   DM (diabetes mellitus), type 2 with renal complications (HCC)   CURRENT MEDS:   . amiodarone  200 mg Oral Daily  . aspirin EC  81 mg Oral Daily  . atorvastatin  40 mg Oral q1800  . carvedilol  12.5 mg Oral BID WC  . folic acid  2,094 mcg Oral Daily  . sodium  bicarbonate  1,300 mg Oral BID    Deitra Mayo Office: [phone] 07/03/2020

## 2020-07-03 NOTE — Progress Notes (Signed)
[facility] NEPHROLOGY PROGRESS NOTE  Assessment/ Plan: Pt is a 70 y.o. yo female  with history of hypertension, diabetes, tobacco use, left atrial myxoma surgery in 05/2020 at [facility], AAA who developed acute kidney injury thought to be due to atheroembolic disease. The creatinine level was 4.1 during outpatient nephrology visit on 1/5. Sent by her PCP for worsening renal function.   #Acute kidney injury on CKD: Recent progressive kidney disease after cardiac surgery thought to be due to atheroembolic disease. The acute worsening probably due to hemodynamic change related with hypotension and decreased oral intake.  UA bland and doppler kidney US without RAS. CT scan ruled out obstruction.   Urine output increasing and creatinine level trending down.  Becoming more acidotic therefore switch IV fluid to sodium bicarbonate. No sign of uremic features or symptoms. Hopefully the kidney function will gradually improve.I have informed the patient that if kidney function does not improve significantly then she will need dialysis. She understands and agrees if needed.    #Metabolic acidosis: Switching to IV sodium bicarbonate.   #Hyperkalemia: Improved after medical treatment.    # Hypertension: Blood pressure is soft. Discontinued amlodipine. Continue beta-blocker because of A. fib.    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  Currently on heparin IV.   Subjective: Seen and examined.  Chart reviewed.  Urine output 800 cc.  Creatinine level down to one 5.9.  Denies nausea, vomiting, chest pain, shortness of breath.  No dysgeusia. Objective Vital signs in last 24 hours: Vitals:   07/02/20 1832 07/02/20 2057 07/03/20 0452 07/03/20 1006  BP: (!) 128/111 (!) 96/55 (!) 114/56 113/75  Pulse: 84 64 76 (!) 102  Resp: 16 17 17 18   Temp: 98 F (36.7 C) 99.1 F (37.3 C)  98.3 F (36.8 C) 98.6 F (37 C)  TempSrc: Oral Oral  Oral  SpO2: 94% 97% 95% 95%  Weight:      Height:       Weight change:   Intake/Output Summary (Last 24 hours) at 07/03/2020 1153 Last data filed at 07/03/2020 0800 Gross per 24 hour  Intake 2559.99 ml  Output 800 ml  Net 1759.99 ml       Labs: Basic Metabolic Panel: Recent Labs  Lab 07/01/20 0700 07/02/20 0434 07/03/20 0320  NA 134* 138 138  K 4.9 4.6 4.9  CL 104 107 110  CO2 15* 18* 14*  GLUCOSE 71 98 69*  BUN 88* 86* 79*  CREATININE 6.24* 6.30* 5.92*  CALCIUM 8.9 8.4* 8.4*  PHOS  --  6.9* 6.2*   Liver Function Tests: Recent Labs  Lab 06/30/20 1508 07/01/20 0700 07/02/20 0434 07/03/20 0320  AST 12* 13*  --   --   ALT 22 19  --   --   ALKPHOS 170* 144*  --   --   BILITOT 0.8 0.8  --   --   PROT 6.9 6.3*  --   --   ALBUMIN 3.1* 2.9* 2.5* 2.5*   Recent Labs  Lab 06/30/20 1508  LIPASE 32   No results for input(s): AMMONIA in the last 168 hours. CBC: Recent Labs  Lab 06/30/20 1508 07/01/20 0700 07/02/20 0434 07/03/20 0320  WBC 13.0* 9.6 8.6 7.9  HGB 9.8* 9.2* 8.1* 7.5*  HCT 30.2* 29.2* 24.4* 24.5*  MCV 100.3* 101.4* 100.0 103.4*  PLT 277 207 161 162   Cardiac Enzymes: No results  for input(s): CKTOTAL, CKMB, CKMBINDEX, TROPONINI in the last 168 hours. CBG: Recent Labs  Lab 07/02/20 0640 07/02/20 1126 07/02/20 1702 07/02/20 2058 07/03/20 0646  GLUCAP 85 100* 65* 125* 90    Iron Studies: No results for input(s): IRON, TIBC, TRANSFERRIN, FERRITIN in the last 72 hours. Studies/Results: VAS [place] ABI WITH/WO TBI  Result Date: 07/01/2020 LOWER EXTREMITY DOPPLER STUDY Indications: Gangrene, and peripheral artery disease. High Risk Factors: Hypertension, Diabetes, current smoker.  Comparison Study: No previous exam Performing Technologist: Vonzell Schlatter RVT  Examination Guidelines: A complete evaluation includes at minimum, Doppler waveform signals and systolic blood pressure reading at the  level of bilateral brachial, anterior tibial, and posterior tibial arteries, when vessel segments are accessible. Bilateral testing is considered an integral part of a complete examination. Photoelectric Plethysmograph (PPG) waveforms and toe systolic pressure readings are included as required and additional duplex testing as needed. Limited examinations for reoccurring indications may be performed as noted.  ABI Findings: +--------+------------------+-----+----------+--------+ Right   Rt Pressure (mmHg)IndexWaveform  Comment  +--------+------------------+-----+----------+--------+ [id number]                                       +--------+------------------+-----+----------+--------+ PTA     65                0.60 monophasic         +--------+------------------+-----+----------+--------+ DP      65                0.60 monophasic         +--------+------------------+-----+----------+--------+ +--------+------------------+-----+----------+-------+ Left    Lt Pressure (mmHg)IndexWaveform  Comment +--------+------------------+-----+----------+-------+ [id number]                                      +--------+------------------+-----+----------+-------+ PTA     66                0.61 monophasic        +--------+------------------+-----+----------+-------+ DP      58                0.53 monophasic        +--------+------------------+-----+----------+-------+  Summary: Right: Resting right ankle-brachial index indicates moderate right lower extremity arterial disease. Left: Resting left ankle-brachial index indicates moderate left lower extremity arterial disease.  *See table(s) above for measurements and observations.  Electronically signed by Deitra Mayo MD on 07/01/2020 at 6:21:05 PM.   Final    VAS US RENAL ARTERY DUPLEX  Result Date: 07/02/2020 ABDOMINAL VISCERAL Indications: Acute renal failure, atheroembolic disease Other Factors: S/P Excision of LT  atrial myxoma late December. Limitations: Air/bowel gas and difficult visibility due to prominent overlying veins. Comparison Study: No prior studies. Performing Technologist: Darlin Coco, RDMS  Examination Guidelines: A complete evaluation includes B-mode imaging, spectral Doppler, color Doppler, and power Doppler as needed of all accessible portions of each vessel. Bilateral testing is considered an integral part of a complete examination. Limited examinations for reoccurring indications may be performed as noted.  Duplex Findings: +--------------------+--------+--------+------+--------+ Mesenteric          PSV cm/sEDV cm/sPlaqueComments +--------------------+--------+--------+------+--------+ Aorta Mid              97                          +--------------------+--------+--------+------+--------+  Celiac Artery Origin  268                          +--------------------+--------+--------+------+--------+ SMA Origin            236                          +--------------------+--------+--------+------+--------+    +------------------+--------+--------+-------+ Right Renal ArteryPSV cm/sEDV cm/sComment +------------------+--------+--------+-------+ Origin               64      16           +------------------+--------+--------+-------+ Proximal             88      11           +------------------+--------+--------+-------+ Mid                  81      15           +------------------+--------+--------+-------+ Distal               62      10           +------------------+--------+--------+-------+ +-----------------+--------+--------+-------+ Left Renal ArteryPSV cm/sEDV cm/sComment +-----------------+--------+--------+-------+ Proximal            64      11           +-----------------+--------+--------+-------+ Mid                 59      12           +-----------------+--------+--------+-------+ Distal              49      12            +-----------------+--------+--------+-------+ +------------+--------+--------+----+-----------+--------+--------+----+ Right KidneyPSV cm/sEDV cm/sRI  Left KidneyPSV cm/sEDV cm/sRI   +------------+--------+--------+----+-----------+--------+--------+----+ Upper Pole  19      6       0.68Upper Pole                      +------------+--------+--------+----+-----------+--------+--------+----+ Mid         23      6       0.70Mid        37      8       0.77 +------------+--------+--------+----+-----------+--------+--------+----+ Lower Pole  21      7       0.69Lower Pole 41      8       0.79 +------------+--------+--------+----+-----------+--------+--------+----+ Hilar       47      8       0.83Hilar      33      9       0.74 +------------+--------+--------+----+-----------+--------+--------+----+ +------------------+-----+------------------+-----+ Right Kidney           Left Kidney             +------------------+-----+------------------+-----+ RAR                    RAR                     +------------------+-----+------------------+-----+ RAR (manual)      0.91 RAR (manual)      0.66  +------------------+-----+------------------+-----+ Cortex                 Cortex                  +------------------+-----+------------------+-----+  Cortex thickness       Corex thickness         +------------------+-----+------------------+-----+ Kidney length (cm)10.43Kidney length (cm)10.10 +------------------+-----+------------------+-----+  Summary: Renal:  Right: No evidence of right renal artery stenosis. RRV flow present.        Normal size right kidney. Left:  No evidence of left renal artery stenosis. LRV flow present.        Normal size of left kidney. Abnormal left Resisitve Index. Mesenteric: Normal Superior Mesenteric artery findings. 70 to 99% stenosis in the celiac artery. AAA identified on previous CT appreciated on today's examination.  *See  table(s) above for measurements and observations.  Diagnosing physician: Deitra Mayo MD  Electronically signed by Deitra Mayo MD on 07/02/2020 at 5:50:27 PM.    Final     Medications: Infusions: . heparin 1,100 Units/hr (07/03/20 0912)  .  sodium bicarbonate (isotonic) infusion in sterile water 75 mL/hr at 07/03/20 1003    Scheduled Medications: . amiodarone  200 mg Oral Daily  . aspirin EC  81 mg Oral Daily  . atorvastatin  40 mg Oral q1800  . carvedilol  12.5 mg Oral BID WC  . folic acid  3,833 mcg Oral Daily  . sodium bicarbonate  1,300 mg Oral BID    have reviewed scheduled and prn medications.  Physical Exam: General:NAD, comfortable Heart:RRR, s1s2 nl Lungs:clear b/l, no crackle Abdomen:soft, Non-tender, non-distended Extremities:No edema Neurology: Alert awake and following commands  Kathryn Hancock Tanna Furry 07/03/2020,11:53 AM  LOS: 2 days

## 2020-07-04 DIAGNOSIS — I480 Paroxysmal atrial fibrillation: Secondary | ICD-10-CM | POA: Diagnosis not present

## 2020-07-04 DIAGNOSIS — I714 Abdominal aortic aneurysm, without rupture: Secondary | ICD-10-CM | POA: Diagnosis not present

## 2020-07-04 DIAGNOSIS — I10 Essential (primary) hypertension: Secondary | ICD-10-CM

## 2020-07-04 DIAGNOSIS — N179 Acute kidney failure, unspecified: Secondary | ICD-10-CM | POA: Diagnosis not present

## 2020-07-04 LAB — CBC
HCT: 22.4 % — ABNORMAL LOW (ref 36.0–46.0)
Hemoglobin: 7.4 g/dL — ABNORMAL LOW (ref 12.0–15.0)
MCH: 32.7 pg (ref 26.0–34.0)
MCHC: 33 g/dL (ref 30.0–36.0)
MCV: 99.1 fL (ref 80.0–100.0)
Platelets: 157 10*3/uL (ref 150–400)
RBC: 2.26 MIL/uL — ABNORMAL LOW (ref 3.87–5.11)
RDW: 15.7 % — ABNORMAL HIGH (ref 11.5–15.5)
WBC: 7.4 10*3/uL (ref 4.0–10.5)
nRBC: 0 % (ref 0.0–0.2)

## 2020-07-04 LAB — RENAL FUNCTION PANEL
Albumin: 2.5 g/dL — ABNORMAL LOW (ref 3.5–5.0)
Anion gap: 14 (ref 5–15)
BUN: 72 mg/dL — ABNORMAL HIGH (ref 8–23)
CO2: 21 mmol/L — ABNORMAL LOW (ref 22–32)
Calcium: 8.6 mg/dL — ABNORMAL LOW (ref 8.9–10.3)
Chloride: 102 mmol/L (ref 98–111)
Creatinine, Ser: 5.7 mg/dL — ABNORMAL HIGH (ref 0.44–1.00)
GFR, Estimated: 8 mL/min — ABNORMAL LOW (ref >60)
Glucose, Bld: 118 mg/dL — ABNORMAL HIGH (ref 70–99)
Phosphorus: 5.3 mg/dL — ABNORMAL HIGH (ref 2.5–4.6)
Potassium: 4.4 mmol/L (ref 3.5–5.1)
Sodium: 137 mmol/L (ref 135–145)

## 2020-07-04 LAB — BASIC METABOLIC PANEL
Anion gap: 14 (ref 5–15)
BUN: 70 mg/dL — ABNORMAL HIGH (ref 8–23)
CO2: 22 mmol/L (ref 22–32)
Calcium: 8.7 mg/dL — ABNORMAL LOW (ref 8.9–10.3)
Chloride: 101 mmol/L (ref 98–111)
Creatinine, Ser: 5.82 mg/dL — ABNORMAL HIGH (ref 0.44–1.00)
GFR, Estimated: 7 mL/min — ABNORMAL LOW (ref >60)
Glucose, Bld: 124 mg/dL — ABNORMAL HIGH (ref 70–99)
Potassium: 4.3 mmol/L (ref 3.5–5.1)
Sodium: 137 mmol/L (ref 135–145)

## 2020-07-04 LAB — HEPARIN LEVEL (UNFRACTIONATED): Heparin Unfractionated: 1.7 IU/mL — ABNORMAL HIGH (ref 0.30–0.70)

## 2020-07-04 LAB — APTT: aPTT: 124 seconds — ABNORMAL HIGH (ref 24–36)

## 2020-07-04 LAB — GLUCOSE, CAPILLARY
Glucose-Capillary: 94 mg/dL (ref 70–99)
Glucose-Capillary: 109 mg/dL — ABNORMAL HIGH (ref 70–99)

## 2020-07-04 MED ORDER — SODIUM BICARBONATE 650 MG PO TABS
1300.0000 mg | ORAL_TABLET | Freq: Three times a day (TID) | ORAL | Status: DC
  Administered 2020-07-04 – 2020-07-11 (×19): 1300 mg via ORAL
  Filled 2020-07-04 (×19): qty 2

## 2020-07-04 NOTE — Progress Notes (Signed)
PROGRESS NOTE    Kathryn Hancock  EPP:[id number] DOB: 01/15/1951 DOA: 06/30/2020 PCP: Gala Lewandowsky, MD   Brief Narrative:  Kathryn Hancock is a 70 y.o. female with history of left atrial myxoma, diabetes mellitus type 2, hypertension, chronic kidney disease stage III who had a left atrial myxoma resection in third week of December last month at [facility] ([place]) during which patient had worsening renal function and lower extremity ischemic change and postoperative A. fib.  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  Was instructed to repeat and the repeat on showed worsening and was advised to come to the ER.  Patient states over the last 2 weeks patient has been worsening nausea vomiting unable to keep anything but denies any abdominal pain or diarrhea.  ED Course: In the ER patient is afebrile and not hypoxic.  Labs show creatinine of 6.3 hemoglobin 9.8 WBC 13 potassium 5.8.  EKG shows A. fib rate controlled.  ER physician discussed with on-call nephrologist Dr. Marval Regal who advised to give fluid for which patient was given fluid bolus and admitted for further observation.  COVID test is pending.   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  CT renal study: Negative for hydronephrosis/obstruction. -Continue with gentle hydration.   -Nephrology on board-appreciate help. -Renal function is is improving slowly and gradually.  Potassium: WNL. -Renal artery duplex ultrasound negative for acute findings. -Avoid nephrotoxic medication.  Continue p.o. 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 -Reviewed ABI -Vascular on board-appreciate help.  Nausea/vomiting: Improving -Could be secondary from uremia.  Benign abdominal exam.  CT renal studies shows no obstruction/hydronephrosis/pyelonephritis. -Continue Zofran as needed for nausea and vomiting.  Hyperkalemia:  -In the setting of worsening kidney function.   -Resolved after Lokelma  Paroxysmal A. fib: Rate control -Continue Coreg and amiodarone.  Hold Eliquis in the setting of worsening kidney function.  Continue heparin as per pharmacy.  Hypertension: Stable.  Continue amlodipine and Coreg  Type 2 diabetes mellitus: Well controlled.  A1c 5.6% -We will discontinue sliding scale insulin as patient had hypoglycemic episode   Anemia of chronic disease:  -Macrocytosis.  H&H is currently stable.  Continue to monitor  Recent CABG for left atrial myxoma at [facility]: -In December 2021.  Patient denies an chest pain or shortness of breath.  Continue to monitor -Continue aspirin, statin  History of abdominal aortic aneurysm: Reviewed CT renal study showed unchanged from prior exam back to January 15/2021.  Leukocytosis: Resolved  Tobacco abuse: Smokes 1 pack of cigarettes per day-tells me that she is working on cessation.  Cuts back from 2 packs to 1 pack.  DVT prophylaxis: Heparin Code Status: Full code Family Communication:  None present at bedside.  Plan of care discussed with patient in length and she verbalized understanding and agreed with it.  1/23-1/24: Discussed plan of care with patient's daughter on the phone and she verbalized understanding.  Disposition Plan: To be  determined  Consultants:   Nephrology  Vascular surgery  Procedures:   CT  renal study  Antimicrobials:   None  Status is: Inpatient  Dispo: The patient is from: Home              Anticipated d/c is to: Home              Anticipated d/c date is: 2 days              Patient currently is not medically stable to d/c.     Subjective: Patient seen and examined.  Resting comfortably on the bed.  No further episodes of nausea.  No acute events overnight.  No fever.  Denies chest pain, shortness of breath, palpitation, leg swelling, orthopnea or PND.  Objective: Vitals:   07/03/20 1006 07/03/20 2109 07/04/20 0417 07/04/20 1014  BP: 113/75 110/75 118/66 111/60  Pulse: (!) 102 (!) 105 82 92  Resp: 18 18 18 16   Temp: 98.6 F (37 C) 98.1 F (36.7 C) 97.7 F (36.5 C) 98 F (36.7 C)  TempSrc: Oral Oral Oral Oral  SpO2: 95% 92% 96% 96%  Weight:      Height:        Intake/Output Summary (Last 24 hours) at 07/04/2020 1104 Last data filed at 07/04/2020 1009 Gross per 24 hour  Intake 2283.38 ml  Output 800 ml  Net 1483.38 ml   Filed Weights   06/30/20 1454  Weight: 61.2 kg    Examination:  General exam: Appears calm and comfortable, on room air, communicating well,  Respiratory system: Clear to auscultation. Respiratory effort normal. Cardiovascular system: S1 & S2 heard, RRR. No JVD, murmurs, rubs, gallops or clicks. No pedal edema.  Midsternal staples noted.  No signs of active bleeding or discharge seen.  Nontender on palpation. Gastrointestinal system: Abdomen is nondistended, soft and nontender. No organomegaly or masses felt. Normal bowel sounds heard. Central nervous system: Alert and oriented. No focal neurological deficits. Extremities: Ischemic changes in all toes on both feet.  Right second toe: Gangrenous changes noted. Psychiatry: Judgement and insight appear normal. Mood & affect appropriate.    Data Reviewed: I have personally reviewed following labs and imaging studies  CBC: Recent Labs  Lab 06/30/20 1508 07/01/20 0700  07/02/20 0434 07/03/20 0320 07/04/20 0841  WBC 13.0* 9.6 8.6 7.9 7.4  HGB 9.8* 9.2* 8.1* 7.5* 7.4*  HCT 30.2* 29.2* 24.4* 24.5* 22.4*  MCV 100.3* 101.4* 100.0 103.4* 99.1  PLT 277 207 161 162 834   Basic Metabolic Panel: Recent Labs  Lab 06/30/20 1508 07/01/20 0700 07/02/20 0434 07/03/20 0320 07/04/20 0841  NA 132* 134* 138 138 137  137  K 5.8* 4.9 4.6 4.9 4.4  4.3  CL 99 104 107 110 102  101  CO2 18* 15* 18* 14* 21*  22  GLUCOSE 218* 71 98 69* 118*  124*  BUN 87* 88* 86* 79* 72*  70*  CREATININE 6.34* 6.24* 6.30* 5.92* 5.70*  5.82*  CALCIUM 9.2 8.9 8.4* 8.4* 8.6*  8.7*  PHOS  --   --  6.9* 6.2* 5.3*   GFR: Estimated Creatinine Clearance: 7.9 mL/min (A) (by C-G formula based on SCr of 5.7 mg/dL (H)). Liver Function Tests: Recent Labs  Lab 06/30/20 1508 07/01/20 0700 07/02/20 0434 07/03/20 0320 07/04/20 0841  AST 12* 13*  --   --   --   ALT 22 19  --   --   --   ALKPHOS 170* 144*  --   --   --  BILITOT 0.8 0.8  --   --   --   PROT 6.9 6.3*  --   --   --   ALBUMIN 3.1* 2.9* 2.5* 2.5* 2.5*   Recent Labs  Lab 06/30/20 1508  LIPASE 32   No results for input(s): AMMONIA in the last 168 hours. Coagulation Profile: No results for input(s): INR, PROTIME in the last 168 hours. Cardiac Enzymes: No results for input(s): CKTOTAL, CKMB, CKMBINDEX, TROPONINI in the last 168 hours. BNP (last 3 results) No results for input(s): PROBNP in the last 8760 hours. HbA1C: No results for input(s): HGBA1C in the last 72 hours. CBG: Recent Labs  Lab 07/02/20 2058 07/03/20 0646 07/03/20 1213 07/03/20 1709 07/03/20 2109  GLUCAP 125* 90 89 101* 112*   Lipid Profile: No results for input(s): CHOL, HDL, LDLCALC, TRIG, CHOLHDL, LDLDIRECT in the last 72 hours. Thyroid Function Tests: No results for input(s): TSH, T4TOTAL, FREET4, T3FREE, THYROIDAB in the last 72 hours. Anemia Panel: No results for input(s): VITAMINB12, FOLATE, FERRITIN, TIBC, IRON, RETICCTPCT in the  last 72 hours. Sepsis Labs: No results for input(s): PROCALCITON, LATICACIDVEN in the last 168 hours.  Recent Results (from the past 240 hour(s))  SARS CORONAVIRUS 2 (TAT 6-24 HRS) Nasopharyngeal Nasopharyngeal Swab     Status: None   Collection Time: 07/01/20  5:46 AM   Specimen: Nasopharyngeal Swab  Result Value Ref Range Status   SARS Coronavirus 2 NEGATIVE NEGATIVE Final    Comment: (NOTE) SARS-CoV-2 target nucleic acids are NOT DETECTED.  The SARS-CoV-2 RNA is generally detectable in upper and lower respiratory specimens during the acute phase of infection. Negative results do not preclude SARS-CoV-2 infection, do not rule out co-infections with other pathogens, and should not be used as the sole basis for treatment or other patient management decisions. Negative results must be combined with clinical observations, patient history, and epidemiological information. The expected result is Negative.  Fact Sheet for Patients: SugarRoll.be  Fact Sheet for Healthcare Providers: https://www.woods-mathews.com/  This test is not yet approved or cleared by the [place] FDA and  has been authorized for detection and/or diagnosis of SARS-CoV-2 by FDA under an Emergency Use Authorization (EUA). This EUA will remain  in effect (meaning this test can be used) for the duration of the COVID-19 declaration under Se ction 564(b)(1) of the Act, 21 U.S.C. section 360bbb-3(b)(1), unless the authorization is terminated or revoked sooner.  Performed at [facility], [place] [address], [place], [place] [address]       Radiology Studies: VAS US RENAL ARTERY DUPLEX  Result Date: 07/02/2020 ABDOMINAL VISCERAL Indications: Acute renal failure, atheroembolic disease Other Factors: S/P Excision of LT atrial myxoma late December. Limitations: Air/bowel gas and difficult visibility due to prominent overlying veins. Comparison Study: No prior studies.  Performing Technologist: Darlin Coco, RDMS  Examination Guidelines: A complete evaluation includes B-mode imaging, spectral Doppler, color Doppler, and power Doppler as needed of all accessible portions of each vessel. Bilateral testing is considered an integral part of a complete examination. Limited examinations for reoccurring indications may be performed as noted.  Duplex Findings: +--------------------+--------+--------+------+--------+ Mesenteric          PSV cm/sEDV cm/sPlaqueComments +--------------------+--------+--------+------+--------+ Aorta Mid              97                          +--------------------+--------+--------+------+--------+ Celiac Artery Origin  268                          +--------------------+--------+--------+------+--------+  SMA Origin            236                          +--------------------+--------+--------+------+--------+    +------------------+--------+--------+-------+ Right Renal ArteryPSV cm/sEDV cm/sComment +------------------+--------+--------+-------+ Origin               64      16           +------------------+--------+--------+-------+ Proximal             88      11           +------------------+--------+--------+-------+ Mid                  81      15           +------------------+--------+--------+-------+ Distal               62      10           +------------------+--------+--------+-------+ +-----------------+--------+--------+-------+ Left Renal ArteryPSV cm/sEDV cm/sComment +-----------------+--------+--------+-------+ Proximal            64      11           +-----------------+--------+--------+-------+ Mid                 59      12           +-----------------+--------+--------+-------+ Distal              49      12           +-----------------+--------+--------+-------+ +------------+--------+--------+----+-----------+--------+--------+----+ Right KidneyPSV cm/sEDV cm/sRI   Left KidneyPSV cm/sEDV cm/sRI   +------------+--------+--------+----+-----------+--------+--------+----+ Upper Pole  19      6       0.68Upper Pole                      +------------+--------+--------+----+-----------+--------+--------+----+ Mid         23      6       0.70Mid        37      8       0.77 +------------+--------+--------+----+-----------+--------+--------+----+ Lower Pole  21      7       0.69Lower Pole 41      8       0.79 +------------+--------+--------+----+-----------+--------+--------+----+ Hilar       47      8       0.83Hilar      33      9       0.74 +------------+--------+--------+----+-----------+--------+--------+----+ +------------------+-----+------------------+-----+ Right Kidney           Left Kidney             +------------------+-----+------------------+-----+ RAR                    RAR                     +------------------+-----+------------------+-----+ RAR (manual)      0.91 RAR (manual)      0.66  +------------------+-----+------------------+-----+ Cortex                 Cortex                  +------------------+-----+------------------+-----+ Cortex thickness       Corex thickness         +------------------+-----+------------------+-----+  Kidney length (cm)10.43Kidney length (cm)10.10 +------------------+-----+------------------+-----+  Summary: Renal:  Right: No evidence of right renal artery stenosis. RRV flow present.        Normal size right kidney. Left:  No evidence of left renal artery stenosis. LRV flow present.        Normal size of left kidney. Abnormal left Resisitve Index. Mesenteric: Normal Superior Mesenteric artery findings. 70 to 99% stenosis in the celiac artery. AAA identified on previous CT appreciated on today's examination.  *See table(s) above for measurements and observations.  Diagnosing physician: Deitra Mayo MD  Electronically signed by Deitra Mayo MD on  07/02/2020 at 5:50:27 PM.    Final     Scheduled Meds: . amiodarone  200 mg Oral Daily  . aspirin EC  81 mg Oral Daily  . atorvastatin  40 mg Oral q1800  . carvedilol  12.5 mg Oral BID WC  . folic acid  0,722 mcg Oral Daily  . sodium bicarbonate  1,300 mg Oral TID   Continuous Infusions: . heparin 1,100 Units/hr (07/03/20 2327)     LOS: 3 days   Time spent: 40 minutes   Tra Wilemon Loann Quill, MD [facility]  If 7PM-7AM, please contact night-coverage [email or web address] 07/04/2020, 11:04 AM

## 2020-07-04 NOTE — Progress Notes (Signed)
ANTICOAGULATION CONSULT NOTE - Follow Up Consult  Pharmacy Consult for heparin Indication: atrial fibrillation  Allergies  Allergen Reactions  . Penicillins Other (See Comments)    Childhood reaction.    Patient Measurements: Height: 5\' 3"  (160 cm) Weight: 61.2 kg (135 lb) IBW/kg (Calculated) : 52.4  Vital Signs: Temp: 98 F (36.7 C) (01/24 1014) Temp Source: Oral (01/24 1014) BP: 111/60 (01/24 1014) Pulse Rate: 92 (01/24 1014)  Labs: Recent Labs    07/01/20 1120 07/01/20 2254 07/02/20 0434 07/03/20 0320 07/04/20 0841  HGB  --    < > 8.1* 7.5* 7.4*  HCT  --   --  24.4* 24.5* 22.4*  PLT  --   --  161 162 157  APTT 54*   < > 77* 67* 124*  HEPARINUNFRC >2.20*  --  >2.20* 1.98*  --   CREATININE  --   --  6.30* 5.92* 5.70*  5.82*   < > = values in this interval not displayed.    Estimated Creatinine Clearance: 7.9 mL/min (A) (by C-G formula based on SCr of 5.7 mg/dL (H)).    Assessment: 70 yo female went to PCP for N/V and found to have "abnormal labs" and sent to ED, found to be w/ AKI (baseline Scr 1.2, now >6), to transition from Eliquis for Afib to UFH; last dose of Eliquis taken 1/20 10a.  PTT elevated this AM at 124 seconds  Goal of Therapy:  Heparin level 0.3-0.7 units/ml  aPTT 66-102 seconds Monitor platelets by anticoagulation protocol: Yes   Plan:  Decrease heparin to 1000 units / hr Follow up AM labs  Thank you Anette Guarneri, PharmD 07/04/2020 10:28 AM

## 2020-07-04 NOTE — Plan of Care (Signed)

## 2020-07-04 NOTE — Plan of Care (Signed)
  Problem: Activity: Goal: Risk for activity intolerance will decrease Outcome: Progressing   

## 2020-07-04 NOTE — Progress Notes (Addendum)
[name] NEPHROLOGY PROGRESS NOTE  Assessment/ Plan: Pt is a 70 y.o. yo female  with history of hypertension, diabetes, tobacco use, left atrial myxoma surgery in 05/2020 at [facility], AAA who developed acute kidney injury thought to be due to atheroembolic disease. The creatinine level was 4.1 during outpatient nephrology visit on 1/5. Sent by her PCP for worsening renal function.   #Acute kidney injury on CKD: Recent progressive kidney disease after cardiac surgery thought to be due to atheroembolic disease. The acute worsening probably due to hemodynamic change related with hypotension and decreased oral intake.  UA bland and doppler kidney US without RAS. CT scan ruled out obstruction.   Urine output increasing and creatinine level trending down.  Becoming more acidotic therefore switch IV fluid to sodium bicarbonate. No sign of frank uremic features or symptoms but she does have some symptoms suggestive though seems GI symptoms predate recent issues.  Hopefully the kidney function will gradually improve.I have informed the patient that if kidney function does not improve significantly then she will need dialysis. She understands and agrees if needed.    #Metabolic acidosis: Start po bicarb 1300 TID.    #Hyperkalemia: Improved after medical treatment.    # Hypertension: Blood pressure ok.  Discontinued amlodipine. Continue beta-blocker because of A. fib.    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  Currently on heparin IV.   Addendum:  Labs reviewed - slight improvement compared to day prior.  No change to plan to continue to monitor.   Subjective: Seen and examined.  Chart reviewed.  Urine output 600 cc.  Labs still pending this AM.  Had some hiccups and near emesis in the past 12 h but she said that's been going on for months to year.   Denies nausea,  vomiting, chest pain, shortness of breath.  No dysgeusia. Objective Vital signs in last 24 hours: Vitals:   07/03/20 0452 07/03/20 1006 07/03/20 2109 07/04/20 0417  BP: (!) 114/56 113/75 110/75 118/66  Pulse: 76 (!) 102 (!) 105 82  Resp: 17 18 18 18   Temp: 98.3 F (36.8 C) 98.6 F (37 C) 98.1 F (36.7 C) 97.7 F (36.5 C)  TempSrc:  Oral Oral Oral  SpO2: 95% 95% 92% 96%  Weight:      Height:       Weight change:   Intake/Output Summary (Last 24 hours) at 07/04/2020 0858 Last data filed at 07/04/2020 0600 Gross per 24 hour  Intake 2283.38 ml  Output 600 ml  Net 1683.38 ml       Labs: Basic Metabolic Panel: Recent Labs  Lab 07/01/20 0700 07/02/20 0434 07/03/20 0320  NA 134* 138 138  K 4.9 4.6 4.9  CL 104 107 110  CO2 15* 18* 14*  GLUCOSE 71 98 69*  BUN 88* 86* 79*  CREATININE 6.24* 6.30* 5.92*  CALCIUM 8.9 8.4* 8.4*  PHOS  --  6.9* 6.2*   Liver Function Tests: Recent Labs  Lab 06/30/20 1508 07/01/20 0700 07/02/20 0434 07/03/20 0320  AST 12* 13*  --   --   ALT 22 19  --   --   ALKPHOS 170* 144*  --   --   BILITOT 0.8 0.8  --   --   PROT 6.9 6.3*  --   --   ALBUMIN 3.1* 2.9* 2.5* 2.5*   Recent Labs  Lab 06/30/20 1508  LIPASE 32  No results for input(s): AMMONIA in the last 168 hours. CBC: Recent Labs  Lab 06/30/20 1508 07/01/20 0700 07/02/20 0434 07/03/20 0320  WBC 13.0* 9.6 8.6 7.9  HGB 9.8* 9.2* 8.1* 7.5*  HCT 30.2* 29.2* 24.4* 24.5*  MCV 100.3* 101.4* 100.0 103.4*  PLT 277 207 161 162   Cardiac Enzymes: No results for input(s): CKTOTAL, CKMB, CKMBINDEX, TROPONINI in the last 168 hours. CBG: Recent Labs  Lab 07/02/20 2058 07/03/20 0646 07/03/20 1213 07/03/20 1709 07/03/20 2109  GLUCAP 125* 90 89 101* 112*    Iron Studies: No results for input(s): IRON, TIBC, TRANSFERRIN, FERRITIN in the last 72 hours. Studies/Results: VAS US RENAL ARTERY DUPLEX  Result Date: 07/02/2020 ABDOMINAL VISCERAL Indications: Acute renal failure,  atheroembolic disease Other Factors: S/P Excision of LT atrial myxoma late December. Limitations: Air/bowel gas and difficult visibility due to prominent overlying veins. Comparison Study: No prior studies. Performing Technologist: Darlin Coco, RDMS  Examination Guidelines: A complete evaluation includes B-mode imaging, spectral Doppler, color Doppler, and power Doppler as needed of all accessible portions of each vessel. Bilateral testing is considered an integral part of a complete examination. Limited examinations for reoccurring indications may be performed as noted.  Duplex Findings: +--------------------+--------+--------+------+--------+ Mesenteric          PSV cm/sEDV cm/sPlaqueComments +--------------------+--------+--------+------+--------+ Aorta Mid              97                          +--------------------+--------+--------+------+--------+ Celiac Artery Origin  268                          +--------------------+--------+--------+------+--------+ SMA Origin            236                          +--------------------+--------+--------+------+--------+    +------------------+--------+--------+-------+ Right Renal ArteryPSV cm/sEDV cm/sComment +------------------+--------+--------+-------+ Origin               64      16           +------------------+--------+--------+-------+ Proximal             88      11           +------------------+--------+--------+-------+ Mid                  81      15           +------------------+--------+--------+-------+ Distal               62      10           +------------------+--------+--------+-------+ +-----------------+--------+--------+-------+ Left Renal ArteryPSV cm/sEDV cm/sComment +-----------------+--------+--------+-------+ Proximal            64      11           +-----------------+--------+--------+-------+ Mid                 59      12            +-----------------+--------+--------+-------+ Distal              49      12           +-----------------+--------+--------+-------+ +------------+--------+--------+----+-----------+--------+--------+----+ Right KidneyPSV cm/sEDV cm/sRI  Left KidneyPSV cm/sEDV cm/sRI   +------------+--------+--------+----+-----------+--------+--------+----+ Upper Pole  19      6       0.68Upper Pole                      +------------+--------+--------+----+-----------+--------+--------+----+ Mid         23      6       0.70Mid        37      8       0.77 +------------+--------+--------+----+-----------+--------+--------+----+ Lower Pole  21      7       0.69Lower Pole 41      8       0.79 +------------+--------+--------+----+-----------+--------+--------+----+ Hilar       47      8       0.83Hilar      33      9       0.74 +------------+--------+--------+----+-----------+--------+--------+----+ +------------------+-----+------------------+-----+ Right Kidney           Left Kidney             +------------------+-----+------------------+-----+ RAR                    RAR                     +------------------+-----+------------------+-----+ RAR (manual)      0.91 RAR (manual)      0.66  +------------------+-----+------------------+-----+ Cortex                 Cortex                  +------------------+-----+------------------+-----+ Cortex thickness       Corex thickness         +------------------+-----+------------------+-----+ Kidney length (cm)10.43Kidney length (cm)10.10 +------------------+-----+------------------+-----+  Summary: Renal:  Right: No evidence of right renal artery stenosis. RRV flow present.        Normal size right kidney. Left:  No evidence of left renal artery stenosis. LRV flow present.        Normal size of left kidney. Abnormal left Resisitve Index. Mesenteric: Normal Superior Mesenteric artery findings. 70 to 99% stenosis in  the celiac artery. AAA identified on previous CT appreciated on today's examination.  *See table(s) above for measurements and observations.  Diagnosing physician: Deitra Mayo MD  Electronically signed by Deitra Mayo MD on 07/02/2020 at 5:50:27 PM.    Final     Medications: Infusions: . heparin 1,100 Units/hr (07/03/20 2327)  .  sodium bicarbonate (isotonic) infusion in sterile water 75 mL/hr at 07/03/20 2330    Scheduled Medications: . amiodarone  200 mg Oral Daily  . aspirin EC  81 mg Oral Daily  . atorvastatin  40 mg Oral q1800  . carvedilol  12.5 mg Oral BID WC  . folic acid  1,287 mcg Oral Daily    have reviewed scheduled and prn medications.  Physical Exam: General:NAD, comfortable  Heart:RRR, s1s2 nl Lungs:clear b/l, no crackle Abdomen:soft, Non-tender, non-distended Extremities:No edema, ishemic toes.  Neurology: Alert awake and following commands, no asterixis  Justin Mend 07/04/2020,8:58 AM  LOS: 3 days

## 2020-07-05 ENCOUNTER — Other Ambulatory Visit: Payer: Self-pay

## 2020-07-05 DIAGNOSIS — N179 Acute kidney failure, unspecified: Secondary | ICD-10-CM | POA: Diagnosis not present

## 2020-07-05 DIAGNOSIS — E1129 Type 2 diabetes mellitus with other diabetic kidney complication: Secondary | ICD-10-CM | POA: Diagnosis not present

## 2020-07-05 DIAGNOSIS — I714 Abdominal aortic aneurysm, without rupture: Secondary | ICD-10-CM | POA: Diagnosis not present

## 2020-07-05 DIAGNOSIS — D151 Benign neoplasm of heart: Secondary | ICD-10-CM | POA: Diagnosis not present

## 2020-07-05 LAB — RENAL FUNCTION PANEL
Albumin: 2.5 g/dL — ABNORMAL LOW (ref 3.5–5.0)
Anion gap: 14 (ref 5–15)
BUN: 70 mg/dL — ABNORMAL HIGH (ref 8–23)
CO2: 22 mmol/L (ref 22–32)
Calcium: 8.7 mg/dL — ABNORMAL LOW (ref 8.9–10.3)
Chloride: 101 mmol/L (ref 98–111)
Creatinine, Ser: 6.09 mg/dL — ABNORMAL HIGH (ref 0.44–1.00)
GFR, Estimated: 7 mL/min — ABNORMAL LOW (ref >60)
Glucose, Bld: 90 mg/dL (ref 70–99)
Phosphorus: 5.4 mg/dL — ABNORMAL HIGH (ref 2.5–4.6)
Potassium: 4.2 mmol/L (ref 3.5–5.1)
Sodium: 137 mmol/L (ref 135–145)

## 2020-07-05 LAB — CBC
HCT: 25.3 % — ABNORMAL LOW (ref 36.0–46.0)
Hemoglobin: 7.8 g/dL — ABNORMAL LOW (ref 12.0–15.0)
MCH: 31.3 pg (ref 26.0–34.0)
MCHC: 30.8 g/dL (ref 30.0–36.0)
MCV: 101.6 fL — ABNORMAL HIGH (ref 80.0–100.0)
Platelets: 158 10*3/uL (ref 150–400)
RBC: 2.49 MIL/uL — ABNORMAL LOW (ref 3.87–5.11)
RDW: 15.8 % — ABNORMAL HIGH (ref 11.5–15.5)
WBC: 7.4 10*3/uL (ref 4.0–10.5)
nRBC: 0 % (ref 0.0–0.2)

## 2020-07-05 LAB — APTT: aPTT: 90 seconds — ABNORMAL HIGH (ref 24–36)

## 2020-07-05 LAB — GLUCOSE, CAPILLARY
Glucose-Capillary: 110 mg/dL — ABNORMAL HIGH (ref 70–99)
Glucose-Capillary: 127 mg/dL — ABNORMAL HIGH (ref 70–99)
Glucose-Capillary: 118 mg/dL — ABNORMAL HIGH (ref 70–99)
Glucose-Capillary: 102 mg/dL — ABNORMAL HIGH (ref 70–99)

## 2020-07-05 LAB — HEPARIN LEVEL (UNFRACTIONATED): Heparin Unfractionated: 1.58 IU/mL — ABNORMAL HIGH (ref 0.30–0.70)

## 2020-07-05 NOTE — Plan of Care (Signed)

## 2020-07-05 NOTE — Progress Notes (Signed)
ANTICOAGULATION CONSULT NOTE - Follow Up Consult  Pharmacy Consult for heparin Indication: atrial fibrillation  Allergies  Allergen Reactions  . Penicillins Other (See Comments)    Childhood reaction.    Patient Measurements: Height: 5\' 3"  (160 cm) Weight: 61.2 kg (135 lb) IBW/kg (Calculated) : 52.4  Vital Signs: Temp: 98.6 F (37 C) (01/25 0516) Temp Source: Oral (01/25 0516) BP: 112/67 (01/25 0516) Pulse Rate: 90 (01/25 0516)  Labs: Recent Labs    07/03/20 0320 07/04/20 0841 07/05/20 0329  HGB 7.5* 7.4* 7.8*  HCT 24.5* 22.4* 25.3*  PLT 162 157 158  APTT 67* 124* 90*  HEPARINUNFRC 1.98* 1.70* 1.58*  CREATININE 5.92* 5.70*  5.82* 6.09*    Estimated Creatinine Clearance: 7.4 mL/min (A) (by C-G formula based on SCr of 6.09 mg/dL (H)).  Assessment: 70 yo female went to PCP for N/V and found to have "abnormal labs" and sent to ED, found to be w/ AKI (baseline Scr 1.2, now >6), to transition from Eliquis for Afib to UFH; last dose of Eliquis taken 1/20 10a.  PTT back in range  Goal of Therapy:  Heparin level 0.3-0.7 units/ml  aPTT 66-102 seconds Monitor platelets by anticoagulation protocol: Yes   Plan:  Continue heparin at 1000 units / hr Follow up AM labs  Thank you Anette Guarneri, PharmD 07/05/2020 8:45 AM

## 2020-07-05 NOTE — Progress Notes (Signed)
Patients daughter [name] informed this RN that she is taking the patients pocket book and all of her home medications that she had in the room and carrying them home with her.    Aurther Loft, RN

## 2020-07-05 NOTE — Progress Notes (Addendum)
PROGRESS NOTE    Kathryn Hancock  XQJ:[id number] DOB: 01/24/1951 DOA: 06/30/2020 PCP: Gala Lewandowsky, MD   Brief Narrative:  Kathryn Hancock is a 70 y.o. female with history of left atrial myxoma, diabetes mellitus type 2, hypertension, chronic kidney disease stage III who had a left atrial myxoma resection in third week of December last month at [facility] during which patient had worsening renal function and lower extremity ischemic change and postoperative A. fib.  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  Was instructed to repeat and the repeat on showed worsening and was advised to come to the ER.  Patient states over the last 2 weeks patient has been worsening nausea vomiting unable to keep anything but denies any abdominal pain or diarrhea.  ED Course: In the ER patient is afebrile and not hypoxic.  Labs show creatinine of 6.3 hemoglobin 9.8 WBC 13 potassium 5.8.  EKG shows A. fib rate controlled.  ER physician discussed with on-call nephrologist Dr. Marval Regal who advised to give fluid for which patient was given fluid bolus and admitted for further observation.  COVID test is pending.   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  CT renal study: Negative for hydronephrosis/obstruction. -Continue with gentle hydration.   -Nephrology on board-appreciate help. -Renal function- worsened this morning despite of IV fluids.she likely needs dialysis and IR consult for dialysis catheter placement.  Potassium: WNL. -Renal artery duplex ultrasound negative for acute findings. -Avoid nephrotoxic medication.  Continue p.o. 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 -Reviewed ABI -Vascular on board-appreciate help.  Nausea/vomiting: Improving -Could be secondary from uremia.  Benign abdominal exam.  CT renal studies shows no obstruction/hydronephrosis/pyelonephritis. -Continue Zofran as needed for nausea and vomiting.  Hyperkalemia:  -In the setting of worsening kidney function.   -Resolved after Lokelma  Paroxysmal A. fib: Rate control -Continue Coreg and amiodarone.  Hold Eliquis in the setting of worsening kidney function.  Continue heparin as per pharmacy.  Hypertension: Stable.  Continue amlodipine and Coreg  Type 2 diabetes mellitus: Well controlled.  A1c 5.6% -We will discontinue sliding scale insulin as patient had hypoglycemic episode   Anemia of chronic disease:  -Macrocytosis.  H&H trended down from 9.8-7.8.  No signs of active bleeding.  Continue to monitor.  Transfuse if hemoglobin less than 7.  Recent CABG for left atrial myxoma at [facility]: -In December 2021.  Patient denies an chest pain or shortness of breath.  Continue to monitor -Continue aspirin, statin  History of abdominal aortic aneurysm: Reviewed CT renal study showed unchanged from prior exam back to January 15/2021.  Leukocytosis: Resolved  Tobacco abuse: Smokes 1 pack of cigarettes per day-tells me that she is working on cessation.  Cuts back from 2 packs to 1 pack.  DVT prophylaxis: Heparin Code Status: Full code Family Communication:  None present at bedside.  Plan of care discussed with patient in length and she verbalized understanding  and agreed with it.  1/23-1/24: Discussed plan of care with patient's daughter  on the phone and she verbalized understanding.  Disposition Plan: To be determined  Consultants:   Nephrology  Vascular surgery  Procedures:   CT renal study  Antimicrobials:   None  Status is: Inpatient  Dispo: The patient is from: Home              Anticipated d/c is to: Home              Anticipated d/c date is: 2 days              Patient currently is not medically stable to d/c.     Subjective: Patient seen and examined.  Resting comfortably on the bed.  No new complaints, no acute events overnight.  Denies nausea or vomiting, chest pain, shortness of breath, palpitation or leg swelling.  Objective: Vitals:   07/04/20 1700 07/04/20 2209 07/05/20 0516 07/05/20 1016  BP: 119/86 (!) 108/51 112/67 (!) 109/58  Pulse: 86 62 90 73  Resp:  17 18 17   Temp: 99 F (37.2 C) 98.2 F (36.8 C) 98.6 F (37 C) 98.6 F (37 C)  TempSrc: Oral Oral Oral Oral  SpO2: 94% 92% 94% 96%  Weight:      Height:        Intake/Output Summary (Last 24 hours) at 07/05/2020 1149 Last data filed at 07/05/2020 1008 Gross per 24 hour  Intake 840.45 ml  Output 1151 ml  Net -310.55 ml   Filed Weights   06/30/20 1454  Weight: 61.2 kg    Examination:  General exam: Appears calm and comfortable, on room air, communicating well,  Respiratory system: Clear to auscultation. Respiratory effort normal. Cardiovascular system: S1 & S2 heard, RRR. No JVD, murmurs, rubs, gallops or clicks. No pedal edema.  Midsternal staples noted.  No signs of active bleeding or discharge seen.  Nontender on palpation. Gastrointestinal system: Abdomen is nondistended, soft and nontender. No organomegaly or masses felt. Normal bowel sounds heard. Central nervous system: Alert and oriented. No focal neurological deficits. Extremities: Ischemic changes in all toes on both feet.  Right second toe: Gangrenous changes noted.   Psychiatry: Judgement and insight appear normal. Mood & affect appropriate.    Data  Reviewed: I have personally reviewed following labs and imaging studies  CBC: Recent Labs  Lab 07/01/20 0700 07/02/20 0434 07/03/20 0320 07/04/20 0841 07/05/20 0329  WBC 9.6 8.6 7.9 7.4 7.4  HGB 9.2* 8.1* 7.5* 7.4* 7.8*  HCT 29.2* 24.4* 24.5* 22.4* 25.3*  MCV 101.4* 100.0 103.4* 99.1 101.6*  PLT 207 161 162 157 182   Basic Metabolic Panel: Recent Labs  Lab 07/01/20 0700 07/02/20 0434 07/03/20 0320 07/04/20 0841 07/05/20 0329  NA 134* 138 138 137  137 137  K 4.9 4.6 4.9 4.4  4.3 4.2  CL 104 107 110 102  101 101  CO2 15* 18* 14* 21*  22 22  GLUCOSE 71 98 69* 118*  124* 90  BUN 88* 86* 79* 72*  70* 70*  CREATININE 6.24* 6.30* 5.92* 5.70*  5.82* 6.09*  CALCIUM 8.9 8.4* 8.4* 8.6*  8.7* 8.7*  PHOS  --  6.9* 6.2* 5.3* 5.4*   GFR: Estimated Creatinine Clearance: 7.4 mL/min (A) (by C-G formula based on SCr of 6.09 mg/dL (H)). Liver Function Tests: Recent Labs  Lab 06/30/20 1508 07/01/20 0700 07/02/20 0434 07/03/20 0320 07/04/20 0841 07/05/20 0329  AST 12* 13*  --   --   --   --  ALT 22 19  --   --   --   --   ALKPHOS 170* 144*  --   --   --   --   BILITOT 0.8 0.8  --   --   --   --   PROT 6.9 6.3*  --   --   --   --   ALBUMIN 3.1* 2.9* 2.5* 2.5* 2.5* 2.5*   Recent Labs  Lab 06/30/20 1508  LIPASE 32   No results for input(s): AMMONIA in the last 168 hours. Coagulation Profile: No results for input(s): INR, PROTIME in the last 168 hours. Cardiac Enzymes: No results for input(s): CKTOTAL, CKMB, CKMBINDEX, TROPONINI in the last 168 hours. BNP (last 3 results) No results for input(s): PROBNP in the last 8760 hours. HbA1C: No results for input(s): HGBA1C in the last 72 hours. CBG: Recent Labs  Lab 07/03/20 2109 07/04/20 1700 07/04/20 2159 07/05/20 0653 07/05/20 1127  GLUCAP 112* 94 109* 110* 127*   Lipid Profile: No results for input(s): CHOL, HDL, LDLCALC, TRIG, CHOLHDL, LDLDIRECT in the last 72 hours. Thyroid Function Tests: No results for  input(s): TSH, T4TOTAL, FREET4, T3FREE, THYROIDAB in the last 72 hours. Anemia Panel: No results for input(s): VITAMINB12, FOLATE, FERRITIN, TIBC, IRON, RETICCTPCT in the last 72 hours. Sepsis Labs: No results for input(s): PROCALCITON, LATICACIDVEN in the last 168 hours.  Recent Results (from the past 240 hour(s))  SARS CORONAVIRUS 2 (TAT 6-24 HRS) Nasopharyngeal Nasopharyngeal Swab     Status: None   Collection Time: 07/01/20  5:46 AM   Specimen: Nasopharyngeal Swab  Result Value Ref Range Status   SARS Coronavirus 2 NEGATIVE NEGATIVE Final    Comment: (NOTE) SARS-CoV-2 target nucleic acids are NOT DETECTED.  The SARS-CoV-2 RNA is generally detectable in upper and lower respiratory specimens during the acute phase of infection. Negative results do not preclude SARS-CoV-2 infection, do not rule out co-infections with other pathogens, and should not be used as the sole basis for treatment or other patient management decisions. Negative results must be combined with clinical observations, patient history, and epidemiological information. The expected result is Negative.  Fact Sheet for Patients: SugarRoll.be  Fact Sheet for Healthcare Providers: https://www.woods-mathews.com/  This test is not yet approved or cleared by the [place] FDA and  has been authorized for detection and/or diagnosis of SARS-CoV-2 by FDA under an Emergency Use Authorization (EUA). This EUA will remain  in effect (meaning this test can be used) for the duration of the COVID-19 declaration under Se ction 564(b)(1) of the Act, 21 U.S.C. section 360bbb-3(b)(1), unless the authorization is terminated or revoked sooner.  Performed at [facility], [place] [address], [place], [place] [address]       Radiology Studies: No results found.  Scheduled Meds: . amiodarone  200 mg Oral Daily  . aspirin EC  81 mg Oral Daily  . atorvastatin  40 mg Oral q1800  .  carvedilol  12.5 mg Oral BID WC  . folic acid  3,354 mcg Oral Daily  . sodium bicarbonate  1,300 mg Oral TID   Continuous Infusions: . heparin 1,000 Units/hr (07/04/20 2353)     LOS: 4 days   Time spent: 40 minutes   Ivanka Kirshner Loann Quill, MD [facility]  If 7PM-7AM, please contact night-coverage [email or web address] 07/05/2020, 11:49 AM

## 2020-07-05 NOTE — Progress Notes (Signed)
[facility] PROGRESS NOTE  Assessment/ Plan: Pt is a 70 y.o. yo female  with history of hypertension, diabetes, tobacco use, left atrial myxoma surgery in 05/2020 at [facility], [place] who developed acute kidney injury thought to be due to atheroembolic disease. The creatinine level was 4.1 during outpatient nephrology visit on 1/5. Sent by her PCP for worsening renal function.   #Acute kidney injury on CKD: Recent progressive kidney disease after cardiac surgery thought to be due to atheroembolic disease. The acute worsening probably due to hemodynamic change related with hypotension and decreased oral intake.  UA bland and doppler kidney US without RAS. CT scan ruled out obstruction.   Renal function continues to worsen despite supportive care.   We had a long discussion re: options and she wishes to proceed with dialysis.  Both parents did it (deceased 09/29/92 and 09-11-22).  I told her it may be temporary but more likely is it will be long term given the trajectory and recent events.  Will request catheter from IR tomorrow.    #Metabolic acidosis: cont po bicarb 1300 TID.    #Hyperkalemia: Improved after medical treatment.    # Hypertension: Blood pressure ok.  Discontinued amlodipine. Continue beta-blocker because of A. fib.    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  Currently on heparin IV.   # A fib:  On heparin gtt for now.    Subjective: Seen and examined.  Chart reviewed.  Urine output 1000 cc.  Labs this AM with worsening renal function with Cr 6.09 from 5.7 yesterday. She feels ok but is tired.    Objective Vital signs in last 24 hours: Vitals:   07/04/20 1658 07/04/20 1700 07/04/20 2209 07/05/20 0516  BP: 119/86 119/86 (!) 108/51 112/67  Pulse: 93 86 62 90  Resp: 16  17 18   Temp: 99 F (37.2 C) 99 F (37.2 C) 98.2 F (36.8 C) 98.6 F (37 C)   TempSrc: Oral Oral Oral Oral  SpO2: 95% 94% 92% 94%  Weight:      Height:       Weight change:   Intake/Output Summary (Last 24 hours) at 07/05/2020 0938 Last data filed at 07/05/2020 0600 Gross per 24 hour  Intake 600.45 ml  Output 1001 ml  Net -400.55 ml       Labs: Basic Metabolic Panel: Recent Labs  Lab 07/03/20 0320 07/04/20 0841 07/05/20 0329  NA 138 137  137 137  K 4.9 4.4  4.3 4.2  CL 110 102  101 101  CO2 14* 21*  22 22  GLUCOSE 69* 118*  124* 90  BUN 79* 72*  70* 70*  CREATININE 5.92* 5.70*  5.82* 6.09*  CALCIUM 8.4* 8.6*  8.7* 8.7*  PHOS 6.2* 5.3* 5.4*   Liver Function Tests: Recent Labs  Lab 06/30/20 1508 07/01/20 0700 07/02/20 0434 07/03/20 0320 07/04/20 0841 07/05/20 0329  AST 12* 13*  --   --   --   --   ALT 22 19  --   --   --   --   ALKPHOS 170* 144*  --   --   --   --   BILITOT 0.8 0.8  --   --   --   --   PROT 6.9 6.3*  --   --   --   --   ALBUMIN 3.1* 2.9*   < > 2.5*  2.5* 2.5*   < > = values in this interval not displayed.   Recent Labs  Lab 06/30/20 1508  LIPASE 32   No results for input(s): AMMONIA in the last 168 hours. CBC: Recent Labs  Lab 07/01/20 0700 07/02/20 0434 07/03/20 0320 07/04/20 0841 07/05/20 0329  WBC 9.6 8.6 7.9 7.4 7.4  HGB 9.2* 8.1* 7.5* 7.4* 7.8*  HCT 29.2* 24.4* 24.5* 22.4* 25.3*  MCV 101.4* 100.0 103.4* 99.1 101.6*  PLT 207 161 162 157 158   Cardiac Enzymes: No results for input(s): CKTOTAL, CKMB, CKMBINDEX, TROPONINI in the last 168 hours. CBG: Recent Labs  Lab 07/03/20 1709 07/03/20 2109 07/04/20 1700 07/04/20 2159 07/05/20 0653  GLUCAP 101* 112* 94 109* 110*    Iron Studies: No results for input(s): IRON, TIBC, TRANSFERRIN, FERRITIN in the last 72 hours. Studies/Results: No results found.  Medications: Infusions: . heparin 1,000 Units/hr (07/04/20 2353)    Scheduled Medications: . amiodarone  200 mg Oral Daily  . aspirin EC  81 mg Oral Daily  . atorvastatin  40 mg  Oral q1800  . carvedilol  12.5 mg Oral BID WC  . folic acid  0,315 mcg Oral Daily  . sodium bicarbonate  1,300 mg Oral TID    have reviewed scheduled and prn medications.  Physical Exam: General:NAD, comfortable  Heart:RRR, s1s2 nl Lungs:clear b/l, no crackle Abdomen:soft, Non-tender, non-distended Extremities:No edema, ishemic toes.  Neurology: Alert awake and following commands, no asterixis   Justin Mend 07/05/2020,9:38 AM  LOS: 4 days

## 2020-07-06 ENCOUNTER — Inpatient Hospital Stay (HOSPITAL_COMMUNITY): Payer: Medicare Other

## 2020-07-06 DIAGNOSIS — N186 End stage renal disease: Secondary | ICD-10-CM

## 2020-07-06 DIAGNOSIS — N179 Acute kidney failure, unspecified: Secondary | ICD-10-CM | POA: Diagnosis not present

## 2020-07-06 HISTORY — PX: IR US GUIDE VASC ACCESS RIGHT: IMG2390

## 2020-07-06 HISTORY — PX: IR FLUORO GUIDE CV LINE RIGHT: IMG2283

## 2020-07-06 LAB — GLUCOSE, CAPILLARY
Glucose-Capillary: 94 mg/dL (ref 70–99)
Glucose-Capillary: 139 mg/dL — ABNORMAL HIGH (ref 70–99)
Glucose-Capillary: 91 mg/dL (ref 70–99)
Glucose-Capillary: 81 mg/dL (ref 70–99)
Glucose-Capillary: 109 mg/dL — ABNORMAL HIGH (ref 70–99)
Glucose-Capillary: 153 mg/dL — ABNORMAL HIGH (ref 70–99)

## 2020-07-06 LAB — CBC
HCT: 22.3 % — ABNORMAL LOW (ref 36.0–46.0)
Hemoglobin: 7.3 g/dL — ABNORMAL LOW (ref 12.0–15.0)
MCH: 32.7 pg (ref 26.0–34.0)
MCHC: 32.7 g/dL (ref 30.0–36.0)
MCV: 100 fL (ref 80.0–100.0)
Platelets: 145 10*3/uL — ABNORMAL LOW (ref 150–400)
RBC: 2.23 MIL/uL — ABNORMAL LOW (ref 3.87–5.11)
RDW: 15.9 % — ABNORMAL HIGH (ref 11.5–15.5)
WBC: 7.6 10*3/uL (ref 4.0–10.5)
nRBC: 0 % (ref 0.0–0.2)

## 2020-07-06 LAB — BASIC METABOLIC PANEL
Anion gap: 12 (ref 5–15)
BUN: 68 mg/dL — ABNORMAL HIGH (ref 8–23)
CO2: 24 mmol/L (ref 22–32)
Calcium: 8.8 mg/dL — ABNORMAL LOW (ref 8.9–10.3)
Chloride: 102 mmol/L (ref 98–111)
Creatinine, Ser: 6.04 mg/dL — ABNORMAL HIGH (ref 0.44–1.00)
GFR, Estimated: 7 mL/min — ABNORMAL LOW (ref >60)
Glucose, Bld: 98 mg/dL (ref 70–99)
Potassium: 4.5 mmol/L (ref 3.5–5.1)
Sodium: 138 mmol/L (ref 135–145)

## 2020-07-06 LAB — APTT: aPTT: 95 seconds — ABNORMAL HIGH (ref 24–36)

## 2020-07-06 LAB — HEPARIN LEVEL (UNFRACTIONATED): Heparin Unfractionated: 1.36 IU/mL — ABNORMAL HIGH (ref 0.30–0.70)

## 2020-07-06 MED ORDER — FENTANYL CITRATE (PF) 100 MCG/2ML IJ SOLN
INTRAMUSCULAR | Status: AC
  Filled 2020-07-06: qty 2

## 2020-07-06 MED ORDER — MIDAZOLAM HCL 2 MG/2ML IJ SOLN
INTRAMUSCULAR | Status: AC
  Filled 2020-07-06: qty 2

## 2020-07-06 MED ORDER — VANCOMYCIN HCL IN DEXTROSE 1-5 GM/200ML-% IV SOLN
1000.0000 mg | INTRAVENOUS | Status: AC
  Filled 2020-07-06: qty 200

## 2020-07-06 MED ORDER — MIDAZOLAM HCL 2 MG/2ML IJ SOLN
INTRAMUSCULAR | Status: AC | PRN
  Administered 2020-07-06: 1 mg via INTRAVENOUS

## 2020-07-06 MED ORDER — FENTANYL CITRATE (PF) 100 MCG/2ML IJ SOLN
INTRAMUSCULAR | Status: AC | PRN
  Administered 2020-07-06: 50 ug via INTRAVENOUS

## 2020-07-06 MED ORDER — LIDOCAINE HCL 1 % IJ SOLN
INTRAMUSCULAR | Status: AC | PRN
  Administered 2020-07-06: 20 mL via INTRADERMAL

## 2020-07-06 MED ORDER — HEPARIN SODIUM (PORCINE) 1000 UNIT/ML IJ SOLN
INTRAMUSCULAR | Status: AC | PRN
  Administered 2020-07-06: 1000 [IU]

## 2020-07-06 MED ORDER — VANCOMYCIN HCL IN DEXTROSE 1-5 GM/200ML-% IV SOLN
INTRAVENOUS | Status: AC
  Administered 2020-07-06: 1000 mg via INTRAVENOUS
  Filled 2020-07-06: qty 200

## 2020-07-06 MED ORDER — CHLORHEXIDINE GLUCONATE CLOTH 2 % EX PADS
6.0000 | MEDICATED_PAD | Freq: Every day | CUTANEOUS | Status: DC
  Administered 2020-07-06 – 2020-07-13 (×6): 6 via TOPICAL

## 2020-07-06 MED ORDER — HEPARIN SODIUM (PORCINE) 1000 UNIT/ML IJ SOLN
INTRAMUSCULAR | Status: AC
  Filled 2020-07-06: qty 1

## 2020-07-06 MED ORDER — CHLORHEXIDINE GLUCONATE CLOTH 2 % EX PADS
6.0000 | MEDICATED_PAD | Freq: Every day | CUTANEOUS | Status: DC
  Administered 2020-07-07 – 2020-07-13 (×5): 6 via TOPICAL

## 2020-07-06 MED ORDER — LIDOCAINE HCL 1 % IJ SOLN
INTRAMUSCULAR | Status: AC
  Filled 2020-07-06: qty 20

## 2020-07-06 MED ORDER — HEPARIN (PORCINE) 25000 UT/250ML-% IV SOLN
1000.0000 [IU]/h | INTRAVENOUS | Status: DC
  Administered 2020-07-06 – 2020-07-07 (×2): 1000 [IU]/h via INTRAVENOUS
  Filled 2020-07-06 (×2): qty 250

## 2020-07-06 NOTE — Progress Notes (Signed)
PROGRESS NOTE    Kathryn Hancock  YBO:[id number] DOB: [birth year] DOA: 06/30/2020 PCP: Gala Lewandowsky, MD   Brief Narrative:  Kathryn Hancock is a 70 y.o. female with history of left atrial myxoma, diabetes mellitus type 2, hypertension, chronic kidney disease stage III who had a left atrial myxoma resection in third week of December last month at [facility] during which patient had worsening renal function and lower extremity ischemic change and postoperative A. fib.  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  Was instructed to repeat and the repeat on showed worsening and was advised to come to the ER.  Patient states over the last 2 weeks patient has been worsening nausea vomiting unable to keep anything but denies any abdominal pain or diarrhea.  ED Course: In the ER patient is afebrile and not hypoxic.  Labs show creatinine of 6.3 hemoglobin 9.8 WBC 13 potassium 5.8.  EKG shows A. fib rate controlled.  ER physician discussed with on-call nephrologist Dr. Marval Regal who advised to give fluid for which patient was given fluid bolus and admitted for further observation.  COVID test is pending.   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  CT renal study: Negative for hydronephrosis/obstruction. -Renal function- worsened despite of IV fluids. -Renal artery duplex ultrasound negative for acute findings. Nephrology on board.  Underwent dialysis catheter today.  Will be started on hemodialysis.  Per nephrology. -Avoid nephrotoxic medication.  Continue p.o. sodium bicarb -Monitor kidney function closely.  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 -Reviewed ABI -Vascular on board-appreciate help.  Plan for arteriogram down the road, timing to be determined by vascular surgery.  Nausea/vomiting: Improving -Could be secondary from uremia.  Benign abdominal exam.  CT renal studies shows no obstruction/hydronephrosis/pyelonephritis. -Continue Zofran as needed for nausea and vomiting.  Hyperkalemia:  -In the setting of worsening kidney function.   -Resolved after Lokelma.  Paroxysmal A. fib: Rate control -Continue Coreg and amiodarone.  Hold Eliquis in the setting of worsening kidney function.  Continue heparin as per pharmacy.  Hypertension: Stable.  Continue amlodipine and Coreg  Type 2 diabetes mellitus: Well controlled.  A1c 5.6% SSI discontinued due to hypoglycemia.  Anemia of chronic disease:  -Macrocytosis.  H&H trended down from 9.8-7.8.  No signs of active bleeding.  Continue to monitor.  Transfuse if hemoglobin less than 7.  Recent CABG for left atrial myxoma at [facility]: -In December 2021.  Patient denies an chest pain or shortness of breath.  Continue to monitor -Continue aspirin, statin  History of abdominal aortic aneurysm: Reviewed CT renal study showed unchanged from prior exam back to January 15/2021.  Leukocytosis: Resolved  Tobacco abuse: Smokes 1 pack of cigarettes per day-tells me that she is working on cessation.  Cuts back from 2 packs to 1 pack.  DVT prophylaxis: Heparin Code Status: Full code Family Communication:  None present at bedside.  Plan of care discussed with patient in length and she verbalized understanding and  agreed with it.  Disposition Plan: To be determined  Consultants:   Nephrology  Vascular  surgery  Procedures:   CT renal study  Antimicrobials:   None  Status is: Inpatient  Dispo: The patient is from: Home              Anticipated d/c is to: Home              Anticipated d/c date is: 2 days              Patient currently is not medically stable to d/c.     Subjective: Patient seen and examined after she returned from dialysis catheter placement.  She has no complaints.  She is full of humor.  Objective: Vitals:   07/06/20 0930 07/06/20 0935 07/06/20 0950 07/06/20 1006  BP: 124/84 120/73 108/72 113/77  Pulse: (!) 113 (!) 113 (!) 111 (!) 114  Resp: (!) _0 Temp:    98 F (36.7 C)  TempSrc:    Oral  SpO2: 99% 100% 95% 95%  Weight:      Height:        Intake/Output Summary (Last 24 hours) at 07/06/2020 1138 Last data filed at 07/06/2020 0900 Gross per 24 hour  Intake 813.76 ml  Output 1050 ml  Net -236.24 ml   Filed Weights   06/30/20 1454 07/06/20 0422  Weight: 61.2 kg 69.6 kg    Examination:  General exam: Appears calm and comfortable  Respiratory system: Clear to auscultation. Respiratory effort normal. Cardiovascular system: S1 & S2 heard, RRR. No JVD, murmurs, rubs, gallops or clicks. No pedal edema. Gastrointestinal system: Abdomen is nondistended, soft and nontender. No organomegaly or masses felt. Normal bowel sounds heard. Central nervous system: Alert and oriented. No focal neurological deficits. Extremities: Symmetric 5 x 5 power. Skin: Ischemic changes on the multiple toes on left and the right foot as pictured below. Psychiatry: Judgement and insight appear normal. Mood & affect appropriate.  noted.   Psychiatry: Judgement and insight appear normal. Mood & affect appropriate.    Data Reviewed: I have personally reviewed following labs and imaging studies  CBC: Recent Labs  Lab 07/02/20 0434 07/03/20 0320 07/04/20 0841 07/05/20 0329 07/06/20 0026  WBC 8.6 7.9 7.4 7.4 7.6  HGB 8.1* 7.5* 7.4* 7.8* 7.3*  HCT 24.4*  24.5* 22.4* 25.3* 22.3*  MCV 100.0 103.4* 99.1 101.6* 100.0  PLT 161 162 157 158 177*   Basic Metabolic Panel: Recent Labs  Lab 07/02/20 0434 07/03/20 0320 07/04/20 0841 07/05/20 0329 07/06/20 0026  NA 138 138 137  137 137 138  K 4.6 4.9 4.4  4.3 4.2 4.5  CL 107 110 102  101 101 102  CO2 18* 14* 21*  _1 GLUCOSE 98 69* 118*  124* 90 98  BUN 86* 79* 72*  70* 70* 68*  CREATININE 6.30* 5.92* 5.70*  5.82* 6.09* 6.04*  CALCIUM 8.4* 8.4* 8.6*  8.7* 8.7* 8.8*  PHOS 6.9* 6.2* 5.3* 5.4*  --    GFR: Estimated Creatinine Clearance: 8.5 mL/min (A) (by C-G formula based on SCr of 6.04 mg/dL (H)). Liver Function Tests: Recent Labs  Lab 06/30/20 1508 07/01/20 0700 07/02/20 0434 07/03/20 0320 07/04/20 0841 07/05/20 0329  AST 12* 13*  --   --   --   --   ALT 22 19  --   --   --   --   ALKPHOS 170* 144*  --   --   --   --  BILITOT 0.8 0.8  --   --   --   --   PROT 6.9 6.3*  --   --   --   --   ALBUMIN 3.1* 2.9* 2.5* 2.5* 2.5* 2.5*   Recent Labs  Lab 06/30/20 1508  LIPASE 32   No results for input(s): AMMONIA in the last 168 hours. Coagulation Profile: No results for input(s): INR, PROTIME in the last 168 hours. Cardiac Enzymes: No results for input(s): CKTOTAL, CKMB, CKMBINDEX, TROPONINI in the last 168 hours. BNP (last 3 results) No results for input(s): PROBNP in the last 8760 hours. HbA1C: No results for input(s): HGBA1C in the last 72 hours. CBG: Recent Labs  Lab 07/05/20 0653 07/05/20 1127 07/05/20 1650 07/05/20 2154 07/06/20 0658  GLUCAP 110* 127* 118* 102* 91   Lipid Profile: No results for input(s): CHOL, HDL, LDLCALC, TRIG, CHOLHDL, LDLDIRECT in the last 72 hours. Thyroid Function Tests: No results for input(s): TSH, T4TOTAL, FREET4, T3FREE, THYROIDAB in the last 72 hours. Anemia Panel: No results for input(s): VITAMINB12, FOLATE, FERRITIN, TIBC, IRON, RETICCTPCT in the last 72 hours. Sepsis Labs: No results for input(s): PROCALCITON,  LATICACIDVEN in the last 168 hours.  Recent Results (from the past 240 hour(s))  SARS CORONAVIRUS 2 (TAT 6-24 HRS) Nasopharyngeal Nasopharyngeal Swab     Status: None   Collection Time: 07/01/20  5:46 AM   Specimen: Nasopharyngeal Swab  Result Value Ref Range Status   SARS Coronavirus 2 NEGATIVE NEGATIVE Final    Comment: (NOTE) SARS-CoV-2 target nucleic acids are NOT DETECTED.  The SARS-CoV-2 RNA is generally detectable in upper and lower respiratory specimens during the acute phase of infection. Negative results do not preclude SARS-CoV-2 infection, do not rule out co-infections with other pathogens, and should not be used as the sole basis for treatment or other patient management decisions. Negative results must be combined with clinical observations, patient history, and epidemiological information. The expected result is Negative.  Fact Sheet for Patients: SugarRoll.be  Fact Sheet for Healthcare Providers: https://www.woods-mathews.com/  This test is not yet approved or cleared by the [place] FDA and  has been authorized for detection and/or diagnosis of SARS-CoV-2 by FDA under an Emergency Use Authorization (EUA). This EUA will remain  in effect (meaning this test can be used) for the duration of the COVID-19 declaration under Se ction 564(b)(1) of the Act, 21 U.S.C. section 360bbb-3(b)(1), unless the authorization is terminated or revoked sooner.  Performed at [facility], [place] [address], [place], [place] [address]       Radiology Studies: IR Fluoro Guide CV Line Right  Result Date: 07/06/2020 INDICATION: 70 year old female with acute on chronic kidney disease in need of hemodialysis. She presents for tunneled hemodialysis catheter placement. EXAM: TUNNELED CENTRAL VENOUS HEMODIALYSIS CATHETER PLACEMENT WITH ULTRASOUND AND FLUOROSCOPIC GUIDANCE MEDICATIONS: 1 g vancomycin. The antibiotic was given in an  appropriate time interval prior to skin puncture. ANESTHESIA/SEDATION: Moderate (conscious) sedation was employed during this procedure. A total of Versed 1 mg and Fentanyl 50 mcg was administered intravenously. Moderate Sedation Time: 20 minutes. The patient's level of consciousness and vital signs were monitored continuously by radiology nursing throughout the procedure under my direct supervision. FLUOROSCOPY TIME:  Fluoroscopy Time: 0 minutes 54 seconds (4 mGy). COMPLICATIONS: None immediate. PROCEDURE: Informed written consent was obtained from the patient after a discussion of the risks, benefits, and alternatives to treatment. Questions regarding the procedure were encouraged and answered. The right neck and chest were prepped with  chlorhexidine in a sterile fashion, and a sterile drape was applied covering the operative field. Maximum barrier sterile technique with sterile gowns and gloves were used for the procedure. A timeout was performed prior to the initiation of the procedure. 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 The right subclavian vein was then interrogated with ultrasound using a supraclavicular approach was found to be widely patent. An image was obtained and stored for the medical record. After creating a small venotomy incision, a micropuncture kit was utilized to access the right subclavian vein under direct, real-time ultrasound guidance after the overlying soft tissues were anesthetized with 1% lidocaine with epinephrine. Ultrasound image documentation was performed. The microwire was kinked to measure appropriate catheter length. A stiff Glidewire was advanced to the level of the IVC and the micropuncture sheath was exchanged for a peel-away sheath. A palindrome tunneled hemodialysis catheter measuring 19 cm from tip to cuff was tunneled in a retrograde fashion from the anterior chest wall to the venotomy incision. The  catheter was then placed through the peel-away sheath with tips ultimately positioned within the superior aspect of the right atrium. Final catheter positioning was confirmed and documented with a spot radiographic image. The catheter aspirates and flushes normally. The catheter was flushed with appropriate volume heparin dwells. The catheter exit site was secured with a 0-Prolene retention suture. The venotomy incision was closed with an interrupted 4-0 Vicryl, Dermabond and Steri-strips. Dressings were applied. The patient tolerated the procedure well without immediate post procedural complication. IMPRESSION: Successful placement of 19 cm tip to cuff tunneled hemodialysis catheter via the right subclavian (supraclavicular approach) vein with tips terminating within the superior aspect of the right atrium. The catheter is ready for immediate use. Electronically Signed   By: Jacqulynn Cadet M.D.   On: 07/06/2020 10:39   IR US Guide Vasc Access Right  Result Date: 07/06/2020 INDICATION: 70 year old female with acute on chronic kidney disease in need of hemodialysis. She presents for tunneled hemodialysis catheter placement. EXAM: TUNNELED CENTRAL VENOUS HEMODIALYSIS CATHETER PLACEMENT WITH ULTRASOUND AND FLUOROSCOPIC GUIDANCE MEDICATIONS: 1 g vancomycin. The antibiotic was given in an appropriate time interval prior to skin puncture. ANESTHESIA/SEDATION: Moderate (conscious) sedation was employed during this procedure. A total of Versed 1 mg and Fentanyl 50 mcg was administered intravenously. Moderate Sedation Time: 20 minutes. The patient's level of consciousness and vital signs were monitored continuously by radiology nursing throughout the procedure under my direct supervision. FLUOROSCOPY TIME:  Fluoroscopy Time: 0 minutes 54 seconds (4 mGy). COMPLICATIONS: None immediate. PROCEDURE: Informed written consent was obtained from the patient after a discussion of the risks, benefits, and alternatives to  treatment. Questions regarding the procedure were encouraged and answered. The right neck and chest were prepped with chlorhexidine in a sterile fashion, and a sterile drape was applied covering the operative field. Maximum barrier sterile technique with sterile gowns and gloves were used for the procedure. A timeout was performed prior to the initiation of the procedure. 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 The right subclavian vein was then interrogated with ultrasound using a supraclavicular approach was found to be widely patent. An image was obtained and stored for the medical record. After creating a small venotomy incision, a micropuncture kit was utilized to access the right subclavian vein under direct, real-time ultrasound guidance after the overlying soft tissues were anesthetized with 1% lidocaine with epinephrine. Ultrasound image documentation was  performed. The microwire was kinked to measure appropriate catheter length. A stiff Glidewire was advanced to the level of the IVC and the micropuncture sheath was exchanged for a peel-away sheath. A palindrome tunneled hemodialysis catheter measuring 19 cm from tip to cuff was tunneled in a retrograde fashion from the anterior chest wall to the venotomy incision. The catheter was then placed through the peel-away sheath with tips ultimately positioned within the superior aspect of the right atrium. Final catheter positioning was confirmed and documented with a spot radiographic image. The catheter aspirates and flushes normally. The catheter was flushed with appropriate volume heparin dwells. The catheter exit site was secured with a 0-Prolene retention suture. The venotomy incision was closed with an interrupted 4-0 Vicryl, Dermabond and Steri-strips. Dressings were applied. The patient tolerated the procedure well without immediate post procedural complication. IMPRESSION: Successful  placement of 19 cm tip to cuff tunneled hemodialysis catheter via the right subclavian (supraclavicular approach) vein with tips terminating within the superior aspect of the right atrium. The catheter is ready for immediate use. Electronically Signed   By: Jacqulynn Cadet M.D.   On: 07/06/2020 10:39    Scheduled Meds: . amiodarone  200 mg Oral Daily  . aspirin EC  81 mg Oral Daily  . atorvastatin  40 mg Oral q1800  . carvedilol  12.5 mg Oral BID WC  . Chlorhexidine Gluconate Cloth  6 each Topical Daily  . fentaNYL      . folic acid  0,115 mcg Oral Daily  . heparin sodium (porcine)      . lidocaine      . midazolam      . sodium bicarbonate  1,300 mg Oral TID   Continuous Infusions: . heparin       LOS: 5 days   Time spent: 33 minutes   Darliss Cheney, MD [facility]  If 7PM-7AM, please contact night-coverage [email or web address] 07/06/2020, 11:38 AM

## 2020-07-06 NOTE — Consult Note (Signed)
Chief Complaint: Patient was seen in consultation today for tunneled hemodialysis catheter placement Chief Complaint  Patient presents with  . Nausea    Referring Physician(s): Leanora Cover  Supervising Physician: Jacqulynn Cadet  Patient Status: [facility] - In-pt  History of Present Illness: Kathryn Hancock is a 70 y.o. 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  Creatinine currently 6.04.  Request now received from nephrology for tunneled HD catheter placement.  Past Medical History:  Diagnosis Date  . AAA (abdominal aortic aneurysm) ([place]) 06/2019   Multiple small pseudoaneurysmal projections of the dominant aorta.  Distal abdominal aortic aneurysm 4.5 x 4.7 cm.  Greatest AP dimension of the infrarenal aorta is 4.9.  No evidence of thoracic aortic aneurysm or dissection.  Brief segment of proximal IMA occlusion.  . CKD (chronic kidney disease) stage 3a, GFR 30-59 ml/min   . Coronary artery calcification seen on CAT scan 06/2019  . Diabetes mellitus without complication (HCC)    Type 2  . Heavy smoker (more than 20 cigarettes per day)    ~ 2 ppd; since age 49 (59 pk yr)  . Hyperlipidemia    Mixed  . Hypertension   . 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 -> TTE 07/24/19: Large size ill-defined echodensity in the left atrium suspicious for thrombus versus tumor.  Moderately dilated LA.--> CMR: Large mobile mass in the LA attached to the IAS-does not appear to be thrombus, characteristics consistent with LEFT ATRIAL MYXOMA  . Microscopic hematuria   .  Plantar wart of right foot     Past Surgical History:  Procedure Laterality Date  . Cardiac MRI  01/22/2020   Normal LV size and function.  Moderate focal basal septal hypertrophy.  No S.A.M.  LVEF 61%.  RVEF 66%.  Large mobile mass in the left atrium attached to the interatrial septum-does not appear to thrombus characteristics consistent with left atrial myxoma.  . Chest CTA  06/2019   Multiple small pseudoaneurysmal projections of the dominant aorta.  Distal abdominal aortic aneurysm 4.5 x 4.7 cm.  Greatest AP dimension of the infrarenal aorta is 4.9.  No evidence of thoracic aortic aneurysm or dissection.  Brief segment of proximal IMA occlusion.  Large geographic filling defect of the left atrium with associated dense radiopaque material.  Aortic atherosclerosis and emphysema  . COLONOSCOPY W/ POLYPECTOMY    . EYE SURGERY Bilateral    Cataract surgery  . TRANSTHORACIC ECHOCARDIOGRAM  07/2019    EF 60 to 65%.  GR 1 DD.  Elevated LVEDP.  Large size ill-defined echodensity in the left atrium suspicious for thrombus versus tumor.  Moderately dilated LA.  . TUBAL LIGATION      Allergies: Penicillins  Medications: Prior to Admission medications   Medication Sig Start Date End Date Taking? Authorizing Provider  acetaminophen (TYLENOL) 500 MG tablet Take 500-1,000 mg by mouth every 6 (six) hours as needed (for pain.).   Yes [provider]  amiodarone (PACERONE) 200 MG tablet Take 200 mg by mouth daily. 06/10/20 06/10/21 Yes [provider]  amLODipine (NORVASC) 5 MG tablet Take 5 mg by mouth daily. 06/10/20 06/10/21 Yes [provider]  apixaban (ELIQUIS) 5 MG TABS tablet Take 1 tablet (5 mg total) by mouth 2 (two) times daily. 12/10/19  Yes Leonie Man, MD  Ascorbic Acid (VITAMIN C) 1000 MG tablet Take 1,000 mg by mouth every evening.   Yes [provider]  aspirin EC 81 MG tablet Take 81 mg by mouth daily. Swallow whole.   Yes [provider]  atorvastatin (LIPITOR) 20 MG tablet Take 20 mg by mouth daily.   Yes [provider]  carvedilol (COREG) 12.5 MG tablet Take 12.5 mg by mouth 2 (two) times daily with a meal. 06/09/20 06/09/21 Yes [provider]  fluticasone (FLONASE) 50 MCG/ACT nasal spray Place 2 sprays into both nostrils as needed for allergies or rhinitis.   Yes [provider]  folic acid (FOLVITE) 400 MCG tablet Take 800 mcg by mouth daily.   Yes [provider]  furosemide (LASIX) 20 MG tablet Take 20 mg by mouth in the morning and at bedtime. 06/09/20 06/09/21 Yes [provider]  glimepiride (AMARYL) 1 MG tablet Take 1 mg by mouth in the morning and at bedtime. Hold if bg less than 120   Yes [provider]  glucose blood test strip 1 each by Other route as needed. Use as instructed   Yes [provider]  montelukast (SINGULAIR) 10 MG tablet Take 10 mg by mouth daily as needed (allergies.).   Yes [provider]  oxyCODONE (OXY IR/ROXICODONE) 5 MG immediate release tablet Take 5-10 mg by mouth every 6 (six) hours as needed for moderate pain. 06/09/20  Yes [provider]  Vitamin D3 (VITAMIN D) 25 MCG tablet Take 2,000 Units by mouth every evening.   Yes [provider]     Family History  Problem Relation Age of Onset  . Diabetes Mother   . Diabetes Father   . Heart attack Father 36  . CAD Father   . Hyperlipidemia Father   . Hypertension Father     Social History   Socioeconomic History  . Marital status: Widowed    Spouse name: Not on file  . Number of children: Not on file  . Years of education: Not on file  . Highest education level: Not on file  Occupational History  . Not on file  Tobacco Use  . Smoking status: Current Every Day Smoker    Packs/day: 2.00    Years: 50.00    Pack years: 100.00    Types: Cigarettes  . Smokeless tobacco: Never Used  Vaping Use  . Vaping Use: Never used  Substance and  Sexual Activity  . Alcohol use: Not Currently  . Drug use: Never  . Sexual activity: Not on file  Other Topics Concern  . Not on file  Social History Narrative   She is a mother of 2-but her son died in 10/27/2019 of a drug overdose.   Denies alcohol use.   Social Determinants of Health   Financial Resource Strain: Not on file  Food Insecurity: Not on file  Transportation Needs: Not on file  Physical Activity: Not on file  Stress: Not on file  Social Connections: Not on file      Review of Systems denies fever, headache, chest pain, dyspnea, cough, abdominal/back pain, nausea, vomiting or bleeding.  Vital Signs: BP 121/87 (BP Location: Right Arm)   Pulse (!) 103   Temp 98 F (36.7 C) (Oral)   Resp 18   Ht 5\' 3"  (1.6 m)  Wt 153 lb 7 oz (69.6 kg)   SpO2 100%   BMI 27.18 kg/m   Physical Exam awake, alert.  Chest clear to auscultation bilaterally.  Heart with tachycardic but regular rhythm.  Midline sternal wound noted with staples in place.  Abdomen soft, positive bowel sounds, nontender.  Trace pretibial edema bilaterally with some ischemic changes to the toes and dry gangrene of the right second toe.  Imaging: DG CHEST PORT 1 VIEW  Result Date: 07/01/2020 CLINICAL DATA:  Vomiting EXAM: PORTABLE CHEST 1 VIEW COMPARISON:  June 26, 2019 FINDINGS: The patient is status post prior median sternotomy. The heart size is enlarged but stable. There is no pneumothorax. No large pleural effusion. Aortic calcifications are noted. There is no acute osseous abnormality. There is a rounded density projecting over the left lower lung zone. IMPRESSION: 1. No acute cardiopulmonary process. 2. Rounded density projecting over the left lower lung zone is likely a nipple shadow. However, a follow-up two-view chest x-ray in 4-6 weeks is recommended to confirm stability or resolution of this finding. Electronically Signed   By: Constance Holster M.D.   On: 07/01/2020 06:14   VAS [place] ABI WITH/WO  TBI  Result Date: 07/01/2020 LOWER EXTREMITY DOPPLER STUDY Indications: Gangrene, and peripheral artery disease. High Risk Factors: Hypertension, Diabetes, current smoker.  Comparison Study: No previous exam Performing Technologist: Vonzell Schlatter RVT  Examination Guidelines: A complete evaluation includes at minimum, Doppler waveform signals and systolic blood pressure reading at the level of bilateral brachial, anterior tibial, and posterior tibial arteries, when vessel segments are accessible. Bilateral testing is considered an integral part of a complete examination. Photoelectric Plethysmograph (PPG) waveforms and toe systolic pressure readings are included as required and additional duplex testing as needed. Limited examinations for reoccurring indications may be performed as noted.  ABI Findings: +--------+------------------+-----+----------+--------+ Right   Rt Pressure (mmHg)IndexWaveform  Comment  +--------+------------------+-----+----------+--------+ [id number]                                       +--------+------------------+-----+----------+--------+ PTA     65                0.60 monophasic         +--------+------------------+-----+----------+--------+ DP      65                0.60 monophasic         +--------+------------------+-----+----------+--------+ +--------+------------------+-----+----------+-------+ Left    Lt Pressure (mmHg)IndexWaveform  Comment +--------+------------------+-----+----------+-------+ [id number]                                      +--------+------------------+-----+----------+-------+ PTA     66                0.61 monophasic        +--------+------------------+-----+----------+-------+ DP      58                0.53 monophasic        +--------+------------------+-----+----------+-------+  Summary: Right: Resting right ankle-brachial index indicates moderate right lower extremity arterial disease. Left: Resting left  ankle-brachial index indicates moderate left lower extremity arterial disease.  *See table(s) above for measurements and observations.  Electronically signed by Deitra Mayo MD on 07/01/2020 at 6:21:05 PM.   Final    CT RENAL STONE STUDY  Result Date: 07/01/2020 CLINICAL DATA:  Flank pain EXAM: CT ABDOMEN AND PELVIS WITHOUT CONTRAST TECHNIQUE: Multidetector CT imaging of the abdomen and pelvis was performed following the standard protocol without IV contrast. COMPARISON:  None. FINDINGS: Lower chest: The visualized heart size within normal limits. No pericardial fluid/thickening. No hiatal hernia. There is patchy airspace consolidation seen at the posterior right lung base Hepatobiliary: Although limited due to the lack of intravenous contrast, normal in appearance without gross focal abnormality. Scattered calcified gallstones are present. There are 2 layering within the gallbladder fundus the largest measuring 9 mm. Pancreas:  Unremarkable.  No surrounding inflammatory changes. Spleen: Normal in size. Although limited due to the lack of intravenous contrast, normal in appearance. Adrenals/Urinary Tract: Both adrenal glands appear normal. There is a punctate nonobstructing renal calculi in the upper pole of the right kidney. No left-sided renal or collecting system calculi are noted. Bladder is unremarkable. Stomach/Bowel: The stomach, small bowel, and colon are normal in appearance. No inflammatory changes or obstructive findings. Scattered colonic diverticula are noted. Appendix is normal. Vascular/Lymphatic: There are no enlarged abdominal or pelvic lymph nodes. 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 The maximum dimension of the aorta with focal outpouching/pseudoaneurysm at the level of L3 measures 4.9 cm. The infrarenal abdominal aorta measures maximum diameter 4.7 cm which  is also unchanged from the prior exam. There is scattered atherosclerosis seen throughout. Reproductive: For Other: No evidence of abdominal wall mass or hernia. Musculoskeletal: No acute or significant osseous findings. IMPRESSION: 1. Patchy airspace consolidation at the posterior right lung base which could be due to atelectasis or infectious etiology. 2. Nonobstructing punctate right renal calculi. 3. Cholelithiasis 4. 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 This is unchanged from prior exam dating back to June 26, 2019. 5. Aortic Atherosclerosis (ICD10-I70.0). Electronically Signed   By: Prudencio Pair M.D.   On: 07/01/2020 02:05   VAS US RENAL ARTERY DUPLEX  Result Date: 07/02/2020 ABDOMINAL VISCERAL Indications: Acute renal failure, atheroembolic disease Other Factors: S/P Excision of LT atrial myxoma late December. Limitations: Air/bowel gas and difficult visibility due to prominent overlying veins. Comparison Study: No prior studies. Performing Technologist: Darlin Coco, RDMS  Examination Guidelines: A complete evaluation includes B-mode imaging, spectral Doppler, color Doppler, and power Doppler as needed of all accessible portions of each vessel. Bilateral testing is considered an integral part of a complete examination. Limited examinations for reoccurring indications may be performed as noted.  Duplex Findings: +--------------------+--------+--------+------+--------+ Mesenteric          PSV cm/sEDV cm/sPlaqueComments +--------------------+--------+--------+------+--------+ Aorta Mid              97                          +--------------------+--------+--------+------+--------+ Celiac Artery Origin  268                          +--------------------+--------+--------+------+--------+ SMA Origin            236                           +--------------------+--------+--------+------+--------+    +------------------+--------+--------+-------+ Right Renal ArteryPSV cm/sEDV cm/sComment +------------------+--------+--------+-------+ Origin  64      16           +------------------+--------+--------+-------+ Proximal             88      11           +------------------+--------+--------+-------+ Mid                  81      15           +------------------+--------+--------+-------+ Distal               62      10           +------------------+--------+--------+-------+ +-----------------+--------+--------+-------+ Left Renal ArteryPSV cm/sEDV cm/sComment +-----------------+--------+--------+-------+ Proximal            64      11           +-----------------+--------+--------+-------+ Mid                 59      12           +-----------------+--------+--------+-------+ Distal              49      12           +-----------------+--------+--------+-------+ +------------+--------+--------+----+-----------+--------+--------+----+ Right KidneyPSV cm/sEDV cm/sRI  Left KidneyPSV cm/sEDV cm/sRI   +------------+--------+--------+----+-----------+--------+--------+----+ Upper Pole  19      6       0.68Upper Pole                      +------------+--------+--------+----+-----------+--------+--------+----+ Mid         23      6       0.70Mid        37      8       0.77 +------------+--------+--------+----+-----------+--------+--------+----+ Lower Pole  21      7       0.69Lower Pole 41      8       0.79 +------------+--------+--------+----+-----------+--------+--------+----+ Hilar       47      8       0.83Hilar      33      9       0.74 +------------+--------+--------+----+-----------+--------+--------+----+ +------------------+-----+------------------+-----+ Right Kidney           Left Kidney             +------------------+-----+------------------+-----+  RAR                    RAR                     +------------------+-----+------------------+-----+ RAR (manual)      0.91 RAR (manual)      0.66  +------------------+-----+------------------+-----+ Cortex                 Cortex                  +------------------+-----+------------------+-----+ Cortex thickness       Corex thickness         +------------------+-----+------------------+-----+ Kidney length (cm)10.43Kidney length (cm)10.10 +------------------+-----+------------------+-----+  Summary: Renal:  Right: No evidence of right renal artery stenosis. RRV flow present.        Normal size right kidney. Left:  No evidence of left renal artery stenosis. LRV flow present.        Normal size of left kidney. Abnormal left Resisitve Index. Mesenteric: Normal  Superior Mesenteric artery findings. 70 to 99% stenosis in the celiac artery. AAA identified on previous CT appreciated on today's examination.  *See table(s) above for measurements and observations.  Diagnosing physician: Deitra Mayo MD  Electronically signed by Deitra Mayo MD on 07/02/2020 at 5:50:27 PM.    Final     Labs:  CBC: Recent Labs    07/03/20 0320 07/04/20 0841 07/05/20 0329 07/06/20 0026  WBC 7.9 7.4 7.4 7.6  HGB 7.5* 7.4* 7.8* 7.3*  HCT 24.5* 22.4* 25.3* 22.3*  PLT 162 157 158 145*    COAGS: Recent Labs    07/03/20 0320 07/04/20 0841 07/05/20 0329 07/06/20 0026  APTT 67* 124* 90* 95*    BMP: Recent Labs    01/14/20 1516 05/26/20 1158 06/30/20 1508 07/03/20 0320 07/04/20 0841 07/05/20 0329 07/06/20 0026  NA 139 139   < > 138 137  137 137 138  K 4.7 7.4*   < > 4.9 4.4  4.3 4.2 4.5  CL 102 106   < > 110 102  101 101 102  CO2 20 15*   < > 14* 21*  22 22 24   GLUCOSE 130* 99   < > 69* 118*  124* 90 98  BUN 15 75*   < > 79* 72*  70* 70* 68*  CALCIUM 10.2 10.0   < > 8.4* 8.6*  8.7* 8.7* 8.8*  CREATININE 1.26* 3.31*   < > 5.92* 5.70*  5.82* 6.09* 6.04*  GFRNONAA 44*  14*   < > 7* 8*  7* 7* 7*  GFRAA 51* 16*  --   --   --   --   --    < > = values in this interval not displayed.    LIVER FUNCTION TESTS: Recent Labs    06/30/20 1508 07/01/20 0700 07/02/20 0434 07/03/20 0320 07/04/20 0841 07/05/20 0329  BILITOT 0.8 0.8  --   --   --   --   AST 12* 13*  --   --   --   --   ALT 22 19  --   --   --   --   ALKPHOS 170* 144*  --   --   --   --   PROT 6.9 6.3*  --   --   --   --   ALBUMIN 3.1* 2.9* 2.5* 2.5* 2.5* 2.5*    TUMOR MARKERS: No results for input(s): AFPTM, CEA, CA199, CHROMGRNA in the last 8760 hours.  Assessment and Plan: 70 y.o. 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  Creatinine currently 6.04.  Request now received from nephrology for tunneled HD catheter placement.  Details/risks of procedure, including but not limited to, internal bleeding, infection, injury to adjacent structures discussed with patient with her understanding and consent.  Procedure scheduled for today.   Thank you for this interesting consult.  I greatly enjoyed meeting Kathryn Hancock and look forward to participating in their care.  A copy of this report was sent to the requesting provider on this date.  Electronically Signed: D. Rowe Robert, PA-C 07/06/2020, 8:15 AM   I spent a total of 25 minutes   in face to face in clinical consultation, greater than  50% of which was counseling/coordinating care for tunneled hemodialysis catheter placement

## 2020-07-06 NOTE — Progress Notes (Signed)
[facility] NEPHROLOGY PROGRESS NOTE  Assessment/ Plan: Pt is a 70 y.o. yo female  with history of hypertension, diabetes, tobacco use, left atrial myxoma surgery in 05/2020 at [facility], AAA who developed acute kidney injury thought to be due to atheroembolic disease. The creatinine level was 4.1 during outpatient nephrology visit on 1/5. Sent by her PCP for worsening renal function.   #Acute kidney injury on CKD: Recent progressive kidney disease after cardiac surgery thought to be due to atheroembolic disease. The acute worsening probably due to hemodynamic change related with hypotension and decreased oral intake.  UA bland and doppler kidney US without RAS. CT scan ruled out obstruction.   Renal function continues to worsen despite supportive care.   We had a long discussion re: options and she wishes to proceed with dialysis.  Both parents did it (deceased 07/24/92 and 2022-06-27).  I told her it may be temporary but more likely is it will be long term given the trajectory and recent events.  TDC placed today by IR, will proceed with HD.  Vein map.    #Metabolic acidosis: cont po bicarb 1300 TID.    #Hyperkalemia: Improved after medical treatment.    # Hypertension: Blood pressure ok.  Discontinued amlodipine. Continue beta-blocker because of A. fib.    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  Currently on heparin IV.   # A fib:  On heparin gtt for now.    Subjective: Seen and examined.  Chart reviewed.  Urine output 1400 cc. She feels ok but is tired.    Objective Vital signs in last 24 hours: Vitals:   07/06/20 0950 07/06/20 1006 07/06/20 1229 07/06/20 1430  BP: 108/72 113/77  118/74  Pulse: (!) 111 (!) 114 (!) 115 (!) 115  Resp: _0 Temp:  98 F (36.7 C)  98.3 F (36.8 C)  TempSrc:  Oral  Oral  SpO2: 95% 95%  96%  Weight:      Height:        Weight change:   Intake/Output Summary (Last 24 hours) at 07/06/2020 1500 Last data filed at 07/06/2020 1436 Gross per 24 hour  Intake 573.76 ml  Output 1650 ml  Net -1076.24 ml       Labs: Basic Metabolic Panel: Recent Labs  Lab 07/03/20 0320 07/04/20 0841 07/05/20 0329 07/06/20 0026  NA 138 137  137 137 138  K 4.9 4.4  4.3 4.2 4.5  CL 110 102  101 101 102  CO2 14* 21*  _1 GLUCOSE 69* 118*  124* 90 98  BUN 79* 72*  70* 70* 68*  CREATININE 5.92* 5.70*  5.82* 6.09* 6.04*  CALCIUM 8.4* 8.6*  8.7* 8.7* 8.8*  PHOS 6.2* 5.3* 5.4*  --    Liver Function Tests: Recent Labs  Lab 06/30/20 1508 07/01/20 0700 07/02/20 0434 07/03/20 0320 07/04/20 0841 07/05/20 0329  AST 12* 13*  --   --   --   --   ALT 22 19  --   --   --   --   ALKPHOS 170* 144*  --   --   --   --   BILITOT 0.8 0.8  --   --   --   --   PROT 6.9 6.3*  --   --   --   --   ALBUMIN 3.1* 2.9*   < >  2.5* 2.5* 2.5*   < > = values in this interval not displayed.   Recent Labs  Lab 06/30/20 1508  LIPASE 32   No results for input(s): AMMONIA in the last 168 hours. CBC: Recent Labs  Lab 07/02/20 0434 07/03/20 0320 07/04/20 0841 07/05/20 0329 07/06/20 0026  WBC 8.6 7.9 7.4 7.4 7.6  HGB 8.1* 7.5* 7.4* 7.8* 7.3*  HCT 24.4* 24.5* 22.4* 25.3* 22.3*  MCV 100.0 103.4* 99.1 101.6* 100.0  PLT 161 162 157 158 145*   Cardiac Enzymes: No results for input(s): CKTOTAL, CKMB, CKMBINDEX, TROPONINI in the last 168 hours. CBG: Recent Labs  Lab 07/05/20 1127 07/05/20 1650 07/05/20 2154 07/06/20 0658 07/06/20 1142  GLUCAP 127* 118* 102* 91 81    Iron Studies: No results for input(s): IRON, TIBC, TRANSFERRIN, FERRITIN in the last 72 hours. Studies/Results: IR Fluoro Guide CV Line Right  Result Date: 07/06/2020 INDICATION: 69-year-old female with acute on chronic kidney disease in need of hemodialysis. She presents for tunneled hemodialysis catheter placement. EXAM: TUNNELED CENTRAL VENOUS  HEMODIALYSIS CATHETER PLACEMENT WITH ULTRASOUND AND FLUOROSCOPIC GUIDANCE MEDICATIONS: 1 g vancomycin. The antibiotic was given in an appropriate time interval prior to skin puncture. ANESTHESIA/SEDATION: Moderate (conscious) sedation was employed during this procedure. A total of Versed 1 mg and Fentanyl 50 mcg was administered intravenously. Moderate Sedation Time: 20 minutes. The patient's level of consciousness and vital signs were monitored continuously by radiology nursing throughout the procedure under my direct supervision. FLUOROSCOPY TIME:  Fluoroscopy Time: 0 minutes 54 seconds (4 mGy). COMPLICATIONS: None immediate. PROCEDURE: Informed written consent was obtained from the patient after a discussion of the risks, benefits, and alternatives to treatment. Questions regarding the procedure were encouraged and answered. The right neck and chest were prepped with chlorhexidine in a sterile fashion, and a sterile drape was applied covering the operative field. Maximum barrier sterile technique with sterile gowns and gloves were used for the procedure. A timeout was performed prior to the initiation of the procedure. 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 The right subclavian vein was then interrogated with ultrasound using a supraclavicular approach was found to be widely patent. An image was obtained and stored for the medical record. After creating a small venotomy incision, a micropuncture kit was utilized to access the right subclavian vein under direct, real-time ultrasound guidance after the overlying soft tissues were anesthetized with 1% lidocaine with epinephrine. Ultrasound image documentation was performed. The microwire was kinked to measure appropriate catheter length. A stiff Glidewire was advanced to the level of the IVC and the micropuncture sheath was exchanged for a peel-away sheath. A palindrome tunneled hemodialysis catheter  measuring 19 cm from tip to cuff was tunneled in a retrograde fashion from the anterior chest wall to the venotomy incision. The catheter was then placed through the peel-away sheath with tips ultimately positioned within the superior aspect of the right atrium. Final catheter positioning was confirmed and documented with a spot radiographic image. The catheter aspirates and flushes normally. The catheter was flushed with appropriate volume heparin dwells. The catheter exit site was secured with a 0-Prolene retention suture. The venotomy incision was closed with an interrupted 4-0 Vicryl, Dermabond and Steri-strips. Dressings were applied. The patient tolerated the procedure well without immediate post procedural complication. IMPRESSION: Successful placement of 19 cm tip to cuff tunneled hemodialysis catheter via the right subclavian (supraclavicular approach) vein with tips terminating within the superior aspect of the right atrium.   The catheter is ready for immediate use. Electronically Signed   By: Jacqulynn Cadet M.D.   On: 07/06/2020 10:39   IR US Guide Vasc Access Right  Result Date: 07/06/2020 INDICATION: 70 year old female with acute on chronic kidney disease in need of hemodialysis. She presents for tunneled hemodialysis catheter placement. EXAM: TUNNELED CENTRAL VENOUS HEMODIALYSIS CATHETER PLACEMENT WITH ULTRASOUND AND FLUOROSCOPIC GUIDANCE MEDICATIONS: 1 g vancomycin. The antibiotic was given in an appropriate time interval prior to skin puncture. ANESTHESIA/SEDATION: Moderate (conscious) sedation was employed during this procedure. A total of Versed 1 mg and Fentanyl 50 mcg was administered intravenously. Moderate Sedation Time: 20 minutes. The patient's level of consciousness and vital signs were monitored continuously by radiology nursing throughout the procedure under my direct supervision. FLUOROSCOPY TIME:  Fluoroscopy Time: 0 minutes 54 seconds (4 mGy). COMPLICATIONS: None immediate.  PROCEDURE: Informed written consent was obtained from the patient after a discussion of the risks, benefits, and alternatives to treatment. Questions regarding the procedure were encouraged and answered. The right neck and chest were prepped with chlorhexidine in a sterile fashion, and a sterile drape was applied covering the operative field. Maximum barrier sterile technique with sterile gowns and gloves were used for the procedure. A timeout was performed prior to the initiation of the procedure. 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 The right subclavian vein was then interrogated with ultrasound using a supraclavicular approach was found to be widely patent. An image was obtained and stored for the medical record. After creating a small venotomy incision, a micropuncture kit was utilized to access the right subclavian vein under direct, real-time ultrasound guidance after the overlying soft tissues were anesthetized with 1% lidocaine with epinephrine. Ultrasound image documentation was performed. The microwire was kinked to measure appropriate catheter length. A stiff Glidewire was advanced to the level of the IVC and the micropuncture sheath was exchanged for a peel-away sheath. A palindrome tunneled hemodialysis catheter measuring 19 cm from tip to cuff was tunneled in a retrograde fashion from the anterior chest wall to the venotomy incision. The catheter was then placed through the peel-away sheath with tips ultimately positioned within the superior aspect of the right atrium. Final catheter positioning was confirmed and documented with a spot radiographic image. The catheter aspirates and flushes normally. The catheter was flushed with appropriate volume heparin dwells. The catheter exit site was secured with a 0-Prolene retention suture. The venotomy incision was closed with an interrupted 4-0 Vicryl, Dermabond and Steri-strips. Dressings were  applied. The patient tolerated the procedure well without immediate post procedural complication. IMPRESSION: Successful placement of 19 cm tip to cuff tunneled hemodialysis catheter via the right subclavian (supraclavicular approach) vein with tips terminating within the superior aspect of the right atrium. The catheter is ready for immediate use. Electronically Signed   By: Jacqulynn Cadet M.D.   On: 07/06/2020 10:39    Medications: Infusions: . heparin 1,000 Units/hr (07/06/20 1414)    Scheduled Medications: . amiodarone  200 mg Oral Daily  . aspirin EC  81 mg Oral Daily  . atorvastatin  40 mg Oral q1800  . carvedilol  12.5 mg Oral BID WC  . Chlorhexidine Gluconate Cloth  6 each Topical Daily  . [START ON 07/07/2020] Chlorhexidine Gluconate Cloth  6 each Topical Q0600  . fentaNYL      . folic acid  1,700 mcg Oral Daily  . heparin sodium (porcine)      . lidocaine      .  midazolam      . sodium bicarbonate  1,300 mg Oral TID    have reviewed scheduled and prn medications.  Physical Exam: General:NAD, comfortable  Heart:RRR, s1s2 nl Lungs:clear b/l, no crackle Abdomen:soft, Non-tender, non-distended Extremities:No edema, +ishemic toes.  Neurology: Alert awake and following commands, no asterixis  But tremor Dialysis access:  RIJ TDC with small dried blood RN to clean up, o/w looks good   A  07/06/2020,3:00 PM  LOS: 5 days   

## 2020-07-06 NOTE — Plan of Care (Signed)

## 2020-07-06 NOTE — Procedures (Signed)
Interventional Radiology Procedure Note  Procedure: Right supraclavicular approach subclavian 19 cm Palindrome tunneled HD catheter.  Cath tips in the RA and ready for use.   Complications: None  Estimated Blood Loss: None  Recommendations: - Routine line care. 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   Signed,  Criselda Peaches, MD

## 2020-07-06 NOTE — H&P (View-Only) (Signed)
   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  She is scheduled for placement of a tunneled dialysis catheter by interventional radiology today and is to begin dialysis.  Once she is on dialysis we could consider arteriography if there are any issues with the toes healing.  Her noninvasive studies suggested infrainguinal arterial occlusive disease bilaterally with ABIs of 60% bilaterally.   SUBJECTIVE:   No specific complaints.  PHYSICAL EXAM:   Vitals:   07/05/20 1704 07/05/20 2016 07/06/20 0421 07/06/20 0422  BP: 116/60 110/72 121/87   Pulse: (!) 108 83 (!) 103   Resp: 18 18 18    Temp: 98.6 F (37 C) 98.2 F (36.8 C) 98 F (36.7 C)   TempSrc: Oral Oral Oral   SpO2: 93% 93% 100%   Weight:    69.6 kg  Height:       Ischemic changes to the toes look exactly the same.  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  LABS:   Lab Results  Component Value Date   WBC 7.6 07/06/2020   HGB 7.3 (L) 07/06/2020   HCT 22.3 (L) 07/06/2020   MCV 100.0 07/06/2020   PLT 145 (L) 07/06/2020   Lab Results  Component Value Date   CREATININE 6.04 (H) 07/06/2020   No results found for: INR, PROTIME CBG (last 3)  Recent Labs    07/05/20 1650 07/05/20 2154 07/06/20 0658  GLUCAP 118* 102* 91    PROBLEM LIST:    Principal Problem:   ARF (acute renal failure) (HCC) Active Problems:   Left atrial myxoma   AAA (abdominal aortic aneurysm) without rupture (HCC)   PAF (paroxysmal atrial fibrillation) (HCC)   Anemia   Essential hypertension   DM (diabetes mellitus), type 2 with renal complications (HCC)   CURRENT MEDS:   . amiodarone  200 mg Oral Daily  . aspirin EC  81 mg Oral Daily  . atorvastatin  40 mg Oral q1800  . carvedilol  12.5 mg Oral BID WC  . folic acid  1,700 mcg Oral Daily  . sodium  bicarbonate  1,300 mg Oral TID    Deitra Mayo Office: [phone] 07/06/2020

## 2020-07-06 NOTE — Progress Notes (Signed)
   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  She is scheduled for placement of a tunneled dialysis catheter by interventional radiology today and is to begin dialysis.  Once she is on dialysis we could consider arteriography if there are any issues with the toes healing.  Her noninvasive studies suggested infrainguinal arterial occlusive disease bilaterally with ABIs of 60% bilaterally.   SUBJECTIVE:   No specific complaints.  PHYSICAL EXAM:   Vitals:   07/05/20 1704 07/05/20 2016 07/06/20 0421 07/06/20 0422  BP: 116/60 110/72 121/87   Pulse: (!) 108 83 (!) 103   Resp: 18 18 18    Temp: 98.6 F (37 C) 98.2 F (36.8 C) 98 F (36.7 C)   TempSrc: Oral Oral Oral   SpO2: 93% 93% 100%   Weight:    69.6 kg  Height:       Ischemic changes to the toes look exactly the same.  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  LABS:   Lab Results  Component Value Date   WBC 7.6 07/06/2020   HGB 7.3 (L) 07/06/2020   HCT 22.3 (L) 07/06/2020   MCV 100.0 07/06/2020   PLT 145 (L) 07/06/2020   Lab Results  Component Value Date   CREATININE 6.04 (H) 07/06/2020   No results found for: INR, PROTIME CBG (last 3)  Recent Labs    07/05/20 1650 07/05/20 2154 07/06/20 0658  GLUCAP 118* 102* 91    PROBLEM LIST:    Principal Problem:   ARF (acute renal failure) (HCC) Active Problems:   Left atrial myxoma   AAA (abdominal aortic aneurysm) without rupture (HCC)   PAF (paroxysmal atrial fibrillation) (HCC)   Anemia   Essential hypertension   DM (diabetes mellitus), type 2 with renal complications (HCC)   CURRENT MEDS:   . amiodarone  200 mg Oral Daily  . aspirin EC  81 mg Oral Daily  . atorvastatin  40 mg Oral q1800  . carvedilol  12.5 mg Oral BID WC  . folic acid  4,270 mcg Oral Daily  . sodium  bicarbonate  1,300 mg Oral TID    Deitra Mayo Office: [phone] 07/06/2020

## 2020-07-06 NOTE — Progress Notes (Signed)
PT Cancellation Note  Patient Details Name: Kathryn Hancock MRN: [id number] DOB: Feb 21, 1951   Cancelled Treatment:    Reason Eval/Treat Not Completed: Medical issues which prohibited therapy  per RN patient just got back from having tunneled cath placed, requests we hold off for now. Will follow.   Windell Norfolk, DPT, PN1   Supplemental Physical Therapist [facility]    Pager [phone] Acute Rehab Office [phone]

## 2020-07-06 NOTE — Progress Notes (Signed)
   07/06/20 1006  Assess: MEWS Score  Temp 98 F (36.7 C)  BP 113/77  Pulse Rate (!) 114  Resp 16  Level of Consciousness Alert  SpO2 95 %  O2 Device Room Air  Assess: MEWS Score  MEWS Temp 0  MEWS Systolic 0  MEWS Pulse 2  MEWS RR 0  MEWS LOC 0  MEWS Score 2  MEWS Score Color Yellow  Assess: if the MEWS score is Yellow or Red  Were vital signs taken at a resting state? Yes  Focused Assessment No change from prior assessment  Early Detection of Sepsis Score *See Row Information* Low  MEWS guidelines implemented *See Row Information* Yes  Treat  Pain Scale 0-10  Pain Score 0  Take Vital Signs  Increase Vital Sign Frequency  Yellow: Q 2hr X 2 then Q 4hr X 2, if remains yellow, continue Q 4hrs  Escalate  MEWS: Escalate Yellow: discuss with charge nurse/RN and consider discussing with provider and RRT  Notify: Charge Nurse/RN  Name of Charge Nurse/RN Notified Notified by Mitchell Heir, RN  Date Charge Nurse/RN Notified 07/06/20  Notify: Provider  Provider Name/Title n/a  Notify: Rapid Response  Name of Rapid Response RN Notified n/a  Document  Patient Outcome Other (Comment) (Continue to monitor)  Progress note created (see row info) Yes

## 2020-07-07 DIAGNOSIS — N179 Acute kidney failure, unspecified: Secondary | ICD-10-CM | POA: Diagnosis not present

## 2020-07-07 DIAGNOSIS — I998 Other disorder of circulatory system: Secondary | ICD-10-CM | POA: Diagnosis not present

## 2020-07-07 LAB — RENAL FUNCTION PANEL
Albumin: 2.2 g/dL — ABNORMAL LOW (ref 3.5–5.0)
Anion gap: 11 (ref 5–15)
BUN: 65 mg/dL — ABNORMAL HIGH (ref 8–23)
CO2: 23 mmol/L (ref 22–32)
Calcium: 8.3 mg/dL — ABNORMAL LOW (ref 8.9–10.3)
Chloride: 103 mmol/L (ref 98–111)
Creatinine, Ser: 6.17 mg/dL — ABNORMAL HIGH (ref 0.44–1.00)
GFR, Estimated: 7 mL/min — ABNORMAL LOW (ref >60)
Glucose, Bld: 143 mg/dL — ABNORMAL HIGH (ref 70–99)
Phosphorus: 5.1 mg/dL — ABNORMAL HIGH (ref 2.5–4.6)
Potassium: 4.1 mmol/L (ref 3.5–5.1)
Sodium: 137 mmol/L (ref 135–145)

## 2020-07-07 LAB — CBC
HCT: 22.4 % — ABNORMAL LOW (ref 36.0–46.0)
Hemoglobin: 7 g/dL — ABNORMAL LOW (ref 12.0–15.0)
MCH: 32 pg (ref 26.0–34.0)
MCHC: 31.3 g/dL (ref 30.0–36.0)
MCV: 102.3 fL — ABNORMAL HIGH (ref 80.0–100.0)
Platelets: 133 10*3/uL — ABNORMAL LOW (ref 150–400)
RBC: 2.19 MIL/uL — ABNORMAL LOW (ref 3.87–5.11)
RDW: 15.8 % — ABNORMAL HIGH (ref 11.5–15.5)
WBC: 7.3 10*3/uL (ref 4.0–10.5)
nRBC: 0 % (ref 0.0–0.2)

## 2020-07-07 LAB — GLUCOSE, CAPILLARY
Glucose-Capillary: 121 mg/dL — ABNORMAL HIGH (ref 70–99)
Glucose-Capillary: 164 mg/dL — ABNORMAL HIGH (ref 70–99)
Glucose-Capillary: 75 mg/dL (ref 70–99)
Glucose-Capillary: 146 mg/dL — ABNORMAL HIGH (ref 70–99)

## 2020-07-07 LAB — APTT: aPTT: 92 seconds — ABNORMAL HIGH (ref 24–36)

## 2020-07-07 LAB — HEPATITIS B SURFACE ANTIGEN: Hepatitis B Surface Ag: NONREACTIVE

## 2020-07-07 LAB — HEPARIN LEVEL (UNFRACTIONATED): Heparin Unfractionated: 1.04 IU/mL — ABNORMAL HIGH (ref 0.30–0.70)

## 2020-07-07 NOTE — Plan of Care (Signed)
  Problem: Education: Goal: Knowledge of General Education information will improve Description: Including pain rating scale, medication(s)/side effects and non-pharmacologic comfort measures Outcome: Progressing   Problem: Clinical Measurements: Goal: Respiratory complications will improve Outcome: Progressing   

## 2020-07-07 NOTE — Progress Notes (Signed)
   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  Given that she is now on dialysis I have recommended that we proceed with arteriography tomorrow given that she has evidence of infrainguinal arterial occlusive disease on exam.  First case will be in the morning so she could potentially dialyze in the afternoon if necessary.  I have reviewed with the patient the indications for arteriography. In addition, I have reviewed the potential complications of arteriography including but not limited to: Bleeding, arterial injury, arterial thrombosis, dye action, renal insufficiency, or other unpredictable medical problems. I have explained to the patient that if we find disease amenable to angioplasty we could potentially address this at the same time. I have discussed the potential complications of angioplasty and stenting, including but not limited to: Bleeding, arterial thrombosis, arterial injury, dissection, or the need for surgical intervention.  I have written preop orders.  SUBJECTIVE:   No complaints  PHYSICAL EXAM:   Vitals:   07/06/20 1430 07/06/20 1638 07/06/20 2122 07/07/20 0442  BP: 118/74 108/67 (!) 104/46 (!) 111/54  Pulse: (!) 115 (!) 118 72 63  Resp: 16 18 18 20   Temp: 98.3 F (36.8 C) 98.5 F (36.9 C) 98.9 F (37.2 C) 97.6 F (36.4 C)  TempSrc: Oral Oral Oral Oral  SpO2: 96% 93% 93% 92%  Weight:      Height:       No change to atheroembolic disease to both feet  LABS:   Lab Results  Component Value Date   WBC 7.3 07/07/2020   HGB 7.0 (L) 07/07/2020   HCT 22.4 (L) 07/07/2020   MCV 102.3 (H) 07/07/2020   PLT 133 (L) 07/07/2020   Lab Results  Component Value Date   CREATININE 6.04 (H) 07/06/2020   No results found for: INR, PROTIME CBG (last 3)  Recent Labs    07/06/20 1636 07/06/20 2123 07/07/20 0656  GLUCAP  109* 153* 121*    PROBLEM LIST:    Principal Problem:   ARF (acute renal failure) (HCC) Active Problems:   Left atrial myxoma   AAA (abdominal aortic aneurysm) without rupture (HCC)   PAF (paroxysmal atrial fibrillation) (HCC)   Anemia   Essential hypertension   DM (diabetes mellitus), type 2 with renal complications (HCC)   CURRENT MEDS:   . amiodarone  200 mg Oral Daily  . aspirin EC  81 mg Oral Daily  . atorvastatin  40 mg Oral q1800  . carvedilol  12.5 mg Oral BID WC  . Chlorhexidine Gluconate Cloth  6 each Topical Daily  . Chlorhexidine Gluconate Cloth  6 each Topical Q0600  . folic acid  6,384 mcg Oral Daily  . sodium bicarbonate  1,300 mg Oral TID    Deitra Mayo Office: [phone] 07/07/2020

## 2020-07-07 NOTE — Evaluation (Signed)
Occupational Therapy Evaluation Patient Details Name: Kathryn Hancock MRN: [id number] DOB: [birth year] Today's Date: 07/07/2020    History of Present Illness Kathryn Hancock is a 70 y.o. female with history of left atrial myxoma, diabetes mellitus type 2, hypertension, chronic kidney disease stage III who had a left atrial myxoma resection and third week of December last month at [facility] during which patient had worsening renal function and lower extremity ischemic change and postoperative A. fib.   Clinical Impression   PTA, pt lives with family (daughter has been providing 24/7 support) and reports able to mobilize in home with RW she received after recent heart surgery. Pt typically able to dress, toilet self and daughter assists with showering tasks. Pt presents now with deficits in endurance, strength, dynamic standing balance and intermittent B foot pain. Pt overall min guard for sit to stand transfers, Supervision for UB ADLs and min guard for LB ADLs. Since dialysis is new for pt, would benefit from further energy conservation strategy education and progression of endurance within pt's tolerance at the acute level. Recommend HHOT at this time. Will continue to monitor and update recommendations as appropriate.     Follow Up Recommendations  Home health OT;Supervision/Assistance - 24 hour    Equipment Recommendations  3 in 1 bedside commode    Recommendations for Other Services       Precautions / Restrictions Precautions Precautions: Fall;Other (comment);Sternal Precaution Booklet Issued:  (verbally reviewed) Precaution Comments: no LE WB restrictions per secure chat Dr. Scot Dock 1/27; heart surgery Dec 2021 - sternal precautions Restrictions Weight Bearing Restrictions: No      Mobility Bed Mobility Overal bed mobility: Needs Assistance Bed Mobility: Supine to Sit;Sit to Supine     Supine to sit: Supervision;HOB elevated Sit to supine: Supervision   General bed  mobility comments: Supervision for safety, no assist    Transfers Overall transfer level: Needs assistance Equipment used: 1 person hand held assist Transfers: Sit to/from Stand Sit to Stand: Min guard         General transfer comment: min guard for sit to stand at bedside to step on/off scale x 2 with NT/OT    Balance Overall balance assessment: Needs assistance Sitting-balance support: No upper extremity supported;Feet supported Sitting balance-Leahy Scale: Good     Standing balance support: Bilateral upper extremity supported;Single extremity supported;During functional activity Standing balance-Leahy Scale: Fair Standing balance comment: fair static standing, benefits from UE support for mobility                           ADL either performed or assessed with clinical judgement   ADL Overall ADL's : Needs assistance/impaired Eating/Feeding: Independent;Sitting   Grooming: Set up;Sitting;Wash/dry face   Upper Body Bathing: Supervision/ safety;Sitting   Lower Body Bathing: Min guard;Sit to/from stand   Upper Body Dressing : Supervision/safety;Sitting   Lower Body Dressing: Min guard;Sit to/from stand Lower Body Dressing Details (indicate cue type and reason): able to don socks sitting EOB with increased time Toilet Transfer: Min guard;Ambulation;RW   Toileting- Water quality scientist and Hygiene: Min guard;Sit to/from stand       Functional mobility during ADLs: Min guard;Rolling walker General ADL Comments: Pt with mild limitations in endurance (starting HD today so endurance limitations may progress), decreased strength and intermittent pain in B feet     Vision Baseline Vision/History: Wears glasses Wears Glasses: Reading only Patient Visual Report: No change from baseline Vision Assessment?: No apparent visual deficits  Perception     Praxis      Pertinent Vitals/Pain Pain Assessment: Faces Faces Pain Scale: Hurts even more Pain  Location: B feet sitting EOB Pain Descriptors / Indicators: Grimacing;Shooting;Sharp Pain Intervention(s): Monitored during session;Repositioned     Hand Dominance Right   Extremity/Trunk Assessment Upper Extremity Assessment Upper Extremity Assessment: Generalized weakness (kept UE ROM chest level)   Lower Extremity Assessment Lower Extremity Assessment: Defer to PT evaluation (necrotic toes, wound on heels)   Cervical / Trunk Assessment Cervical / Trunk Assessment: Normal   Communication Communication Communication: No difficulties   Cognition Arousal/Alertness: Awake/alert Behavior During Therapy: WFL for tasks assessed/performed Overall Cognitive Status: Within Functional Limits for tasks assessed                                 General Comments: A&Ox4, pleasant and cooperative   General Comments  Pt still with staples present at sternal surgical site. Provided beginning education on energy conservation and encouraged to float heels in bed due to decreased skin integrity. Coordinated with staff to order B prevalon boots for pt    Exercises     Shoulder Instructions      Home Living Family/patient expects to be discharged to:: Private residence Living Arrangements: Children;Other (Comment) (2 grandsons (young adults)) Available Help at Discharge: Family;Available 24 hours/day Type of Home: House Home Access: Level entry (high threshold)     Home Layout: One level     Bathroom Shower/Tub: Tub only (garden tub)         Home Equipment: Environmental consultant - 2 wheels;Tub bench;Hand held shower head;Bedside commode   Additional Comments: Daughter lost job at beginning of pandemic. Provides 24/7 assist      Prior Functioning/Environment Level of Independence: Needs assistance  Gait / Transfers Assistance Needed: Pt reports prior to surgery in Dec 2021, no use of AD (did get dizzy and hold to wall but no falls). Reports after surgery, pt got RW from [place] /  Homemaking Assistance Needed: Prior to heart surgery dec 2021, pt able to shower and dress self but required rest breaks due to LE pain. After surgery, daughter assisted with showers, transfers, and dressing as needed. Daughter drives and assists with IADLs            OT Problem List: Decreased strength;Decreased activity tolerance;Impaired balance (sitting and/or standing);Decreased knowledge of use of DME or AE;Pain      OT Treatment/Interventions: Self-care/ADL training;Therapeutic exercise;Energy conservation;DME and/or AE instruction;Therapeutic activities;Patient/family education;Balance training    OT Goals(Current goals can be found in the care plan section) Acute Rehab OT Goals Patient Stated Goal: go home, pain control of LEs OT Goal Formulation: With patient Time For Goal Achievement: 07/20/20 Potential to Achieve Goals: Good ADL Goals Pt Will Perform Grooming: with modified independence;standing Pt Will Perform Lower Body Dressing: with modified independence;sitting/lateral leans;sit to/from stand Pt Will Transfer to Toilet: with modified independence;ambulating Pt Will Perform Toileting - Clothing Manipulation and hygiene: with modified independence;sitting/lateral leans;sit to/from stand Additional ADL Goal #1: Pt to verbalize 3 energy conservation strategies to implement during daily tasks Additional ADL Goal #2: Pt to increase functional activity tolerance to 5 minutes without rest break to improve overall endurance  OT Frequency: Min 2X/week   Barriers to D/C:            Co-evaluation              AM-PAC OT "6 Clicks" Daily  Activity     Outcome Measure Help from another person eating meals?: None Help from another person taking care of personal grooming?: A Little Help from another person toileting, which includes using toliet, bedpan, or urinal?: A Little Help from another person bathing (including washing, rinsing, drying)?: A Little Help from another  person to put on and taking off regular upper body clothing?: A Little Help from another person to put on and taking off regular lower body clothing?: A Little 6 Click Score: 19   End of Session Nurse Communication: Mobility status;Weight bearing status  Activity Tolerance: Patient tolerated treatment well Patient left: in bed;with call bell/phone within reach;with bed alarm set;with nursing/sitter in room  OT Visit Diagnosis: Muscle weakness (generalized) (M62.81);Other abnormalities of gait and mobility (R26.89);Pain Pain - Right/Left:  (bilateral) Pain - part of body: Ankle and joints of foot                Time: 4287-6811 OT Time Calculation (min): 45 min Charges:  OT General Charges $OT Visit: 1 Visit OT Evaluation $OT Eval Moderate Complexity: 1 Mod OT Treatments $Self Care/Home Management : 8-22 mins $Therapeutic Activity: 8-22 mins  Layla Maw, OTR/L  Layla Maw 07/07/2020, 8:56 AM

## 2020-07-07 NOTE — Progress Notes (Signed)
[facility] NEPHROLOGY PROGRESS NOTE  Assessment/ Plan: Pt is a 69 y.o. 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 The creatinine level was 4.1 during outpatient nephrology visit on 1/5. Sent by her PCP for worsening renal function.   #Acute kidney injury on CKD: Recent progressive kidney disease after cardiac surgery thought to be due to atheroembolic disease. The acute worsening probably due to hemodynamic change related with hypotension and decreased oral intake.  UA bland and doppler kidney US without RAS. CT scan ruled out obstruction.   Renal function continues to worsen despite supportive care.   We had a long discussion re: options and she wishes to proceed with dialysis.  Both parents did it (deceased 08/09/1992 and 07/20/2022).  I told her it may be temporary but more likely is it will be long term given the trajectory and recent events.  TDC placed today by IR, will proceed with HD, #1 today.  Vein map.    #Metabolic acidosis: cont po bicarb 1300 TID.  Will d/c once on stable dialysis.    #Hyperkalemia: Improved after medical treatment.    # Hypertension: Blood pressure ok.  Discontinued amlodipine. Continue beta-blocker because of A. fib.    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  On heparin gtt currently.    # A fib:  On heparin gtt for now.    Subjective: Seen and examined.  Chart reviewed.  Urine output 600 cc. She feels ok but is tired.    Objective Vital signs in last 24 hours: Vitals:   07/06/20 1430 07/06/20 1638 07/06/20 08-09-2120 07/07/20 0442  BP: 118/74 108/67 (!) 104/46 (!) 111/54  Pulse: (!) 115 (!) 118 72 63  Resp: 16 18 18 20   Temp: 98.3 F (36.8 C) 98.5 F (36.9 C) 98.9 F (37.2 C) 97.6 F (36.4 C)   TempSrc: Oral Oral Oral Oral  SpO2: 96% 93% 93% 92%  Weight:      Height:       Weight change:   Intake/Output Summary (Last 24 hours) at 07/07/2020 0751 Last data filed at 07/07/2020 0601 Gross per 24 hour  Intake 517.67 ml  Output 600 ml  Net -82.33 ml       Labs: Basic Metabolic Panel: Recent Labs  Lab 07/03/20 0320 07/04/20 0841 07/05/20 0329 07/06/20 0026  NA 138 137  137 137 138  K 4.9 4.4  4.3 4.2 4.5  CL 110 102  101 101 102  CO2 14* 21*  22 22 24   GLUCOSE 69* 118*  124* 90 98  BUN 79* 72*  70* 70* 68*  CREATININE 5.92* 5.70*  5.82* 6.09* 6.04*  CALCIUM 8.4* 8.6*  8.7* 8.7* 8.8*  PHOS 6.2* 5.3* 5.4*  --    Liver Function Tests: Recent Labs  Lab 06/30/20 1508 07/01/20 0700 07/02/20 0434 07/03/20 0320 07/04/20 0841 07/05/20 0329  AST 12* 13*  --   --   --   --   ALT 22 19  --   --   --   --   ALKPHOS 170* 144*  --   --   --   --   BILITOT 0.8 0.8  --   --   --   --   PROT 6.9 6.3*  --   --   --   --  ALBUMIN 3.1* 2.9*   < > 2.5* 2.5* 2.5*   < > = values in this interval not displayed.   Recent Labs  Lab 06/30/20 1508  LIPASE 32   No results for input(s): AMMONIA in the last 168 hours. CBC: Recent Labs  Lab 07/03/20 0320 07/04/20 0841 07/05/20 0329 07/06/20 0026 07/07/20 0323  WBC 7.9 7.4 7.4 7.6 7.3  HGB 7.5* 7.4* 7.8* 7.3* 7.0*  HCT 24.5* 22.4* 25.3* 22.3* 22.4*  MCV 103.4* 99.1 101.6* 100.0 102.3*  PLT 162 157 158 145* 133*   Cardiac Enzymes: No results for input(s): CKTOTAL, CKMB, CKMBINDEX, TROPONINI in the last 168 hours. CBG: Recent Labs  Lab 07/06/20 0658 07/06/20 1142 07/06/20 1636 07/06/20 2123 07/07/20 0656  GLUCAP 91 81 109* 153* 121*    Iron Studies: No results for input(s): IRON, TIBC, TRANSFERRIN, FERRITIN in the last 72 hours. Studies/Results: IR Fluoro Guide CV Line Right  Result Date: 07/06/2020 INDICATION: 70 year old female with acute on chronic kidney disease in need of hemodialysis. She  presents for tunneled hemodialysis catheter placement. EXAM: TUNNELED CENTRAL VENOUS HEMODIALYSIS CATHETER PLACEMENT WITH ULTRASOUND AND FLUOROSCOPIC GUIDANCE MEDICATIONS: 1 g vancomycin. The antibiotic was given in an appropriate time interval prior to skin puncture. ANESTHESIA/SEDATION: Moderate (conscious) sedation was employed during this procedure. A total of Versed 1 mg and Fentanyl 50 mcg was administered intravenously. Moderate Sedation Time: 20 minutes. The patient's level of consciousness and vital signs were monitored continuously by radiology nursing throughout the procedure under my direct supervision. FLUOROSCOPY TIME:  Fluoroscopy Time: 0 minutes 54 seconds (4 mGy). COMPLICATIONS: None immediate. PROCEDURE: Informed written consent was obtained from the patient after a discussion of the risks, benefits, and alternatives to treatment. Questions regarding the procedure were encouraged and answered. The right neck and chest were prepped with chlorhexidine in a sterile fashion, and a sterile drape was applied covering the operative field. Maximum barrier sterile technique with sterile gowns and gloves were used for the procedure. A timeout was performed prior to the initiation of the procedure. 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 The right subclavian vein was then interrogated with ultrasound using a supraclavicular approach was found to be widely patent. An image was obtained and stored for the medical record. After creating a small venotomy incision, a micropuncture kit was utilized to access the right subclavian vein under direct, real-time ultrasound guidance after the overlying soft tissues were anesthetized with 1% lidocaine with epinephrine. Ultrasound image documentation was performed. The microwire was kinked to measure appropriate catheter length. A stiff Glidewire was advanced to the level of the IVC and the micropuncture sheath  was exchanged for a peel-away sheath. A palindrome tunneled hemodialysis catheter measuring 19 cm from tip to cuff was tunneled in a retrograde fashion from the anterior chest wall to the venotomy incision. The catheter was then placed through the peel-away sheath with tips ultimately positioned within the superior aspect of the right atrium. Final catheter positioning was confirmed and documented with a spot radiographic image. The catheter aspirates and flushes normally. The catheter was flushed with appropriate volume heparin dwells. The catheter exit site was secured with a 0-Prolene retention suture. The venotomy incision was closed with an interrupted 4-0 Vicryl, Dermabond and Steri-strips. Dressings were applied. The patient tolerated the procedure well without immediate post procedural complication. IMPRESSION: Successful placement of 19 cm tip to cuff tunneled hemodialysis catheter via the right subclavian (supraclavicular approach) vein with tips terminating within  the superior aspect of the right atrium. The catheter is ready for immediate use. Electronically Signed   By: Jacqulynn Cadet M.D.   On: 07/06/2020 10:39   IR US Guide Vasc Access Right  Result Date: 07/06/2020 INDICATION: 70 year old female with acute on chronic kidney disease in need of hemodialysis. She presents for tunneled hemodialysis catheter placement. EXAM: TUNNELED CENTRAL VENOUS HEMODIALYSIS CATHETER PLACEMENT WITH ULTRASOUND AND FLUOROSCOPIC GUIDANCE MEDICATIONS: 1 g vancomycin. The antibiotic was given in an appropriate time interval prior to skin puncture. ANESTHESIA/SEDATION: Moderate (conscious) sedation was employed during this procedure. A total of Versed 1 mg and Fentanyl 50 mcg was administered intravenously. Moderate Sedation Time: 20 minutes. The patient's level of consciousness and vital signs were monitored continuously by radiology nursing throughout the procedure under my direct supervision. FLUOROSCOPY TIME:   Fluoroscopy Time: 0 minutes 54 seconds (4 mGy). COMPLICATIONS: None immediate. PROCEDURE: Informed written consent was obtained from the patient after a discussion of the risks, benefits, and alternatives to treatment. Questions regarding the procedure were encouraged and answered. The right neck and chest were prepped with chlorhexidine in a sterile fashion, and a sterile drape was applied covering the operative field. Maximum barrier sterile technique with sterile gowns and gloves were used for the procedure. A timeout was performed prior to the initiation of the procedure. 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 The right subclavian vein was then interrogated with ultrasound using a supraclavicular approach was found to be widely patent. An image was obtained and stored for the medical record. After creating a small venotomy incision, a micropuncture kit was utilized to access the right subclavian vein under direct, real-time ultrasound guidance after the overlying soft tissues were anesthetized with 1% lidocaine with epinephrine. Ultrasound image documentation was performed. The microwire was kinked to measure appropriate catheter length. A stiff Glidewire was advanced to the level of the IVC and the micropuncture sheath was exchanged for a peel-away sheath. A palindrome tunneled hemodialysis catheter measuring 19 cm from tip to cuff was tunneled in a retrograde fashion from the anterior chest wall to the venotomy incision. The catheter was then placed through the peel-away sheath with tips ultimately positioned within the superior aspect of the right atrium. Final catheter positioning was confirmed and documented with a spot radiographic image. The catheter aspirates and flushes normally. The catheter was flushed with appropriate volume heparin dwells. The catheter exit site was secured with a 0-Prolene retention suture. The venotomy incision was  closed with an interrupted 4-0 Vicryl, Dermabond and Steri-strips. Dressings were applied. The patient tolerated the procedure well without immediate post procedural complication. IMPRESSION: Successful placement of 19 cm tip to cuff tunneled hemodialysis catheter via the right subclavian (supraclavicular approach) vein with tips terminating within the superior aspect of the right atrium. The catheter is ready for immediate use. Electronically Signed   By: Jacqulynn Cadet M.D.   On: 07/06/2020 10:39   VAS [place] UPPER EXT VEIN MAPPING (PRE-OP AVF)  Result Date: 07/06/2020 UPPER EXTREMITY VEIN MAPPING  Indications: Pre-access. Comparison Study: No prior exams Performing Technologist: Rogelia Rohrer  Examination Guidelines: A complete evaluation includes B-mode imaging, spectral Doppler, color Doppler, and power Doppler as needed of all accessible portions of each vessel. Bilateral testing is considered an integral part of a complete examination. Limited examinations for reoccurring indications may be performed as noted. +-----------------+-------------+----------+---------+ Right Cephalic   Diameter (cm)Depth (cm)Findings  +-----------------+-------------+----------+---------+ Prox upper arm  0.35        0.87             +-----------------+-------------+----------+---------+ Mid upper arm        0.37        0.49             +-----------------+-------------+----------+---------+ Dist upper arm       0.45        0.52             +-----------------+-------------+----------+---------+ Antecubital fossa    0.55        0.23   branching +-----------------+-------------+----------+---------+ Prox forearm         0.36        0.56             +-----------------+-------------+----------+---------+ Mid forearm          0.33        0.51             +-----------------+-------------+----------+---------+ Dist forearm         0.32        0.33              +-----------------+-------------+----------+---------+ Wrist                0.28        0.29             +-----------------+-------------+----------+---------+ +-----------------+-------------+----------+---------+ Right Basilic    Diameter (cm)Depth (cm)Findings  +-----------------+-------------+----------+---------+ Shoulder             0.45                         +-----------------+-------------+----------+---------+ Prox upper arm       0.42                         +-----------------+-------------+----------+---------+ Mid upper arm        0.40                         +-----------------+-------------+----------+---------+ Dist upper arm       0.50               branching +-----------------+-------------+----------+---------+ Antecubital fossa    0.57               branching +-----------------+-------------+----------+---------+ Prox forearm         0.37                         +-----------------+-------------+----------+---------+ Mid forearm          0.36                         +-----------------+-------------+----------+---------+ Distal forearm       0.28               branching +-----------------+-------------+----------+---------+ Wrist                0.20                         +-----------------+-------------+----------+---------+ +-----------------+-------------+----------+---------------------+ Left Cephalic    Diameter (cm)Depth (cm)      Findings        +-----------------+-------------+----------+---------------------+ Prox upper arm       0.23        0.50                         +-----------------+-------------+----------+---------------------+  Mid upper arm        0.17        0.58                         +-----------------+-------------+----------+---------------------+ Dist upper arm       0.40        0.39         Thrombus        +-----------------+-------------+----------+---------------------+ Antecubital fossa     0.45        0.30         Thrombus        +-----------------+-------------+----------+---------------------+ Prox forearm         0.34        0.50         branching       +-----------------+-------------+----------+---------------------+ Mid forearm          0.34        0.73                         +-----------------+-------------+----------+---------------------+ Dist forearm         0.37        0.47                         +-----------------+-------------+----------+---------------------+ Wrist                                   not visualized and IV +-----------------+-------------+----------+---------------------+ +-----------------+-------------+----------+---------+ Left Basilic     Diameter (cm)Depth (cm)Findings  +-----------------+-------------+----------+---------+ Shoulder             0.93                         +-----------------+-------------+----------+---------+ Prox upper arm       0.37                         +-----------------+-------------+----------+---------+ Mid upper arm        0.35               branching +-----------------+-------------+----------+---------+ Dist upper arm       0.32                         +-----------------+-------------+----------+---------+ Antecubital fossa    0.36               branching +-----------------+-------------+----------+---------+ Prox forearm         0.23                         +-----------------+-------------+----------+---------+ Mid forearm          0.19                         +-----------------+-------------+----------+---------+ Distal forearm       0.15                         +-----------------+-------------+----------+---------+ Wrist                0.12                         +-----------------+-------------+----------+---------+ Summary:  Left: SVT of cephalic  vein in area of distal upper arm and AC fossa       - previous site of IV. *See table(s) above for measurements and  observations.  Diagnosing physician: Harold Barban MD Electronically signed by Harold Barban MD on 07/06/2020 at 9:36:31 PM.    Final     Medications: Infusions: . heparin 1,000 Units/hr (07/06/20 1414)    Scheduled Medications: . amiodarone  200 mg Oral Daily  . aspirin EC  81 mg Oral Daily  . atorvastatin  40 mg Oral q1800  . carvedilol  12.5 mg Oral BID WC  . Chlorhexidine Gluconate Cloth  6 each Topical Daily  . Chlorhexidine Gluconate Cloth  6 each Topical Q0600  . folic acid  9,326 mcg Oral Daily  . sodium bicarbonate  1,300 mg Oral TID    have reviewed scheduled and prn medications.  Physical Exam: General:NAD, comfortable  Heart:RRR, s1s2 nl Lungs:clear b/l, no crackle Abdomen:soft, Non-tender, non-distended Extremities:No edema, +ishemic toes.  Neurology: Alert awake and following commands, no asterixis  But tremor Dialysis access:  RIJ TDC c/d/i  Justin Mend 07/07/2020,7:51 AM  LOS: 6 days

## 2020-07-07 NOTE — Progress Notes (Signed)
ANTICOAGULATION CONSULT NOTE - Follow Up Consult  Pharmacy Consult for heparin Indication: atrial fibrillation  Allergies  Allergen Reactions  . Penicillins Other (See Comments)    Childhood reaction.    Patient Measurements: Height: 5\' 3"  (160 cm) Weight: 69.9 kg (154 lb 1.6 oz) IBW/kg (Calculated) : 52.4  Vital Signs: Temp: 97.6 F (36.4 C) (01/27 0442) Temp Source: Oral (01/27 0442) BP: 111/54 (01/27 0442) Pulse Rate: 63 (01/27 0442)  Labs: Recent Labs    07/05/20 0329 07/06/20 0026 07/07/20 0323  HGB 7.8* 7.3* 7.0*  HCT 25.3* 22.3* 22.4*  PLT 158 145* 133*  APTT 90* 95* 92*  HEPARINUNFRC 1.58* 1.36* 1.04*  CREATININE 6.09* 6.04*  --     Estimated Creatinine Clearance: 8.5 mL/min (A) (by C-G formula based on SCr of 6.04 mg/dL (H)).  Assessment: 70 yo female went to PCP for N/V and found to have "abnormal labs" and sent to ED, found to be w/ AKI (baseline Scr 1.2, now >6), to transition from Eliquis for Afib to UFH; last dose of Eliquis taken 1/20 10a.  PTT in range, Heparin level still falsely elevated  Starting HD, vascular following - > planning procedure 1/28 HgB down to 7, Plts 133  Goal of Therapy:  Heparin level 0.3-0.7 units/ml  aPTT 66-102 seconds Monitor platelets by anticoagulation protocol: Yes   Plan:  Continue heparin at 1000 units / hr Follow up AM labs  Thank you Anette Guarneri, PharmD 07/07/2020 8:49 AM

## 2020-07-07 NOTE — Progress Notes (Signed)
PROGRESS NOTE    Kiondra Caicedo  HBZ:[id number] DOB: 1951/01/28 DOA: 06/30/2020 PCP: Gala Lewandowsky, MD   Brief Narrative:  Teofila Bowery is a 70 y.o. female with history of left atrial myxoma, diabetes mellitus type 2, hypertension, chronic kidney disease stage III who had a left atrial myxoma resection in third week of December last month at [facility] during which patient had worsening renal function and lower extremity ischemic change and postoperative A. fib.  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  Was instructed to repeat and the repeat on showed worsening and was advised to come to the ER.  Patient states over the last 2 weeks patient has been worsening nausea vomiting unable to keep anything but denies any abdominal pain or diarrhea.  ED Course: In the ER patient is afebrile and not hypoxic.  Labs show creatinine of 6.3 hemoglobin 9.8 WBC 13 potassium 5.8.  EKG shows A. fib rate controlled.  ER physician discussed with on-call nephrologist Dr. Marval Regal who advised to give fluid for which patient was given fluid bolus and admitted for further observation.  COVID test is pending.   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  CT renal study: Negative for hydronephrosis/obstruction. -Renal function- worsened despite of IV fluids. -Renal artery duplex ultrasound negative for acute findings. Nephrology on board.  Underwent dialysis catheter placement 07/06/2020.  Will be started on hemodialysis today.  Per nephrology. -Avoid nephrotoxic medication.  Continue p.o. sodium bicarb -Monitor kidney function closely.  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 -Reviewed ABI -Vascular on board-appreciate help.  Plan for arteriogram tomorrow.  Nausea/vomiting: Improving -Could be secondary from uremia.  Benign abdominal exam.  CT renal studies shows no obstruction/hydronephrosis/pyelonephritis. -Continue Zofran as needed for nausea and vomiting.  Hyperkalemia:  -In the setting of worsening kidney function.   -Resolved after Lokelma.  Paroxysmal A. fib: Rate control -Continue Coreg and amiodarone.  Hold Eliquis in the setting of worsening kidney function.  Continue heparin as per pharmacy.  Hypertension: Stable.  Continue amlodipine and Coreg  Type 2 diabetes mellitus: Well controlled.  A1c 5.6% SSI discontinued due to hypoglycemia.  Anemia of chronic disease:  -Macrocytosis.  H&H trended down and now 7.0 today.  No signs of active bleeding.  Continue to monitor.  Transfuse if hemoglobin less than 7.  Will defer to nephrology today for transfusion during dialysis.  Recent CABG for left atrial myxoma at [facility]: -In December 2021.  Patient denies an chest pain or shortness of breath.  Continue to monitor -Continue aspirin, statin  History of abdominal aortic aneurysm: Reviewed CT renal study showed unchanged from prior exam back to January 15/2021.  Leukocytosis: Resolved  Tobacco abuse: Smokes 1 pack of cigarettes per day-tells me that she is working on cessation.  Cuts back from 2 packs to 1 pack.  DVT prophylaxis: Heparin Code Status: Full code Family Communication:  None present at bedside.  Plan of care discussed with patient in length  and she verbalized understanding and agreed with it.  Disposition Plan: To be determined  Consultants:    Nephrology  Vascular surgery  Procedures:   CT renal study  Antimicrobials:   None  Status is: Inpatient  Dispo: The patient is from: Home              Anticipated d/c is to: Home              Anticipated d/c date is: 2 days              Patient currently is not medically stable to d/c.     Subjective: Seen and examined.  No complaints.  Fully motivated.  Objective: Vitals:   07/06/20 2122 07/07/20 0442 07/07/20 0700 07/07/20 0957  BP: (!) 104/46 (!) 111/54  (!) 113/47  Pulse: 72 63  64  Resp: 18 20  16   Temp: 98.9 F (37.2 C) 97.6 F (36.4 C)  98.6 F (37 C)  TempSrc: Oral Oral  Oral  SpO2: 93% 92%  97%  Weight:   69.9 kg   Height:        Intake/Output Summary (Last 24 hours) at 07/07/2020 1128 Last data filed at 07/07/2020 0700 Gross per 24 hour  Intake 757.67 ml  Output 600 ml  Net 157.67 ml   Filed Weights   06/30/20 1454 07/06/20 0422 07/07/20 0700  Weight: 61.2 kg 69.6 kg 69.9 kg    Examination:  General exam: Appears calm and comfortable  Respiratory system: Clear to auscultation. Respiratory effort normal. Cardiovascular system: S1 & S2 heard, RRR. No JVD, murmurs, rubs, gallops or clicks. No pedal edema. Gastrointestinal system: Abdomen is nondistended, soft and nontender. No organomegaly or masses felt. Normal bowel sounds heard. Central nervous system: Alert and oriented. No focal neurological deficits. Extremities: Symmetric 5 x 5 power. Skin: Several necrotic toes of bilateral feet as pictured below. Psychiatry: Judgement and insight appear normal. Mood & affect appropriate.  noted.   Psychiatry: Judgement and insight appear normal. Mood & affect appropriate.    Data Reviewed: I have personally reviewed following labs and imaging studies  CBC: Recent Labs  Lab 07/03/20 0320 07/04/20 0841 07/05/20 0329 07/06/20 0026 07/07/20 0323  WBC 7.9 7.4 7.4 7.6 7.3  HGB 7.5* 7.4* 7.8* 7.3* 7.0*  HCT 24.5* 22.4* 25.3* 22.3* 22.4*  MCV  103.4* 99.1 101.6* 100.0 102.3*  PLT 162 157 158 145* 060*   Basic Metabolic Panel: Recent Labs  Lab 07/02/20 0434 07/03/20 0320 07/04/20 0841 07/05/20 0329 07/06/20 0026  NA 138 138 137  137 137 138  K 4.6 4.9 4.4  4.3 4.2 4.5  CL 107 110 102  101 101 102  CO2 18* 14* 21*  22 22 24   GLUCOSE 98 69* 118*  124* 90 98  BUN 86* 79* 72*  70* 70* 68*  CREATININE 6.30* 5.92* 5.70*  5.82* 6.09* 6.04*  CALCIUM 8.4* 8.4* 8.6*  8.7* 8.7* 8.8*  PHOS 6.9* 6.2* 5.3* 5.4*  --    GFR: Estimated Creatinine Clearance: 8.5 mL/min (A) (by C-G formula based on SCr of 6.04 mg/dL (H)). Liver Function Tests: Recent Labs  Lab 06/30/20 1508 07/01/20 0700 07/02/20 0434 07/03/20 0320 07/04/20 0841 07/05/20 0329  AST 12* 13*  --   --   --   --   ALT 22 19  --   --   --   --   ALKPHOS 170* 144*  --   --   --   --  BILITOT 0.8 0.8  --   --   --   --   PROT 6.9 6.3*  --   --   --   --   ALBUMIN 3.1* 2.9* 2.5* 2.5* 2.5* 2.5*   Recent Labs  Lab 06/30/20 1508  LIPASE 32   No results for input(s): AMMONIA in the last 168 hours. Coagulation Profile: No results for input(s): INR, PROTIME in the last 168 hours. Cardiac Enzymes: No results for input(s): CKTOTAL, CKMB, CKMBINDEX, TROPONINI in the last 168 hours. BNP (last 3 results) No results for input(s): PROBNP in the last 8760 hours. HbA1C: No results for input(s): HGBA1C in the last 72 hours. CBG: Recent Labs  Lab 07/06/20 0658 07/06/20 1142 07/06/20 1636 07/06/20 2123 07/07/20 0656  GLUCAP 91 81 109* 153* 121*   Lipid Profile: No results for input(s): CHOL, HDL, LDLCALC, TRIG, CHOLHDL, LDLDIRECT in the last 72 hours. Thyroid Function Tests: No results for input(s): TSH, T4TOTAL, FREET4, T3FREE, THYROIDAB in the last 72 hours. Anemia Panel: No results for input(s): VITAMINB12, FOLATE, FERRITIN, TIBC, IRON, RETICCTPCT in the last 72 hours. Sepsis Labs: No results for input(s): PROCALCITON, LATICACIDVEN in the last 168  hours.  Recent Results (from the past 240 hour(s))  SARS CORONAVIRUS 2 (TAT 6-24 HRS) Nasopharyngeal Nasopharyngeal Swab     Status: None   Collection Time: 07/01/20  5:46 AM   Specimen: Nasopharyngeal Swab  Result Value Ref Range Status   SARS Coronavirus 2 NEGATIVE NEGATIVE Final    Comment: (NOTE) SARS-CoV-2 target nucleic acids are NOT DETECTED.  The SARS-CoV-2 RNA is generally detectable in upper and lower respiratory specimens during the acute phase of infection. Negative results do not preclude SARS-CoV-2 infection, do not rule out co-infections with other pathogens, and should not be used as the sole basis for treatment or other patient management decisions. Negative results must be combined with clinical observations, patient history, and epidemiological information. The expected result is Negative.  Fact Sheet for Patients: SugarRoll.be  Fact Sheet for Healthcare Providers: https://www.woods-mathews.com/  This test is not yet approved or cleared by the [place] FDA and  has been authorized for detection and/or diagnosis of SARS-CoV-2 by FDA under an Emergency Use Authorization (EUA). This EUA will remain  in effect (meaning this test can be used) for the duration of the COVID-19 declaration under Se ction 564(b)(1) of the Act, 21 U.S.C. section 360bbb-3(b)(1), unless the authorization is terminated or revoked sooner.  Performed at [facility], [place] [address] [place], [place] [address]       Radiology Studies: IR Fluoro Guide CV Line Right  Result Date: 07/06/2020 INDICATION: 70 year old female with acute on chronic kidney disease in need of hemodialysis. She presents for tunneled hemodialysis catheter placement. EXAM: TUNNELED CENTRAL VENOUS HEMODIALYSIS CATHETER PLACEMENT WITH ULTRASOUND AND FLUOROSCOPIC GUIDANCE MEDICATIONS: 1 g vancomycin. The antibiotic was given in an appropriate time interval prior to skin  puncture. ANESTHESIA/SEDATION: Moderate (conscious) sedation was employed during this procedure. A total of Versed 1 mg and Fentanyl 50 mcg was administered intravenously. Moderate Sedation Time: 20 minutes. The patient's level of consciousness and vital signs were monitored continuously by radiology nursing throughout the procedure under my direct supervision. FLUOROSCOPY TIME:  Fluoroscopy Time: 0 minutes 54 seconds (4 mGy). COMPLICATIONS: None immediate. PROCEDURE: Informed written consent was obtained from the patient after a discussion of the risks, benefits, and alternatives to treatment. Questions regarding the procedure were encouraged and answered. The right neck and chest were prepped with  chlorhexidine in a sterile fashion, and a sterile drape was applied covering the operative field. Maximum barrier sterile technique with sterile gowns and gloves were used for the procedure. A timeout was performed prior to the initiation of the procedure. 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 The right subclavian vein was then interrogated with ultrasound using a supraclavicular approach was found to be widely patent. An image was obtained and stored for the medical record. After creating a small venotomy incision, a micropuncture kit was utilized to access the right subclavian vein under direct, real-time ultrasound guidance after the overlying soft tissues were anesthetized with 1% lidocaine with epinephrine. Ultrasound image documentation was performed. The microwire was kinked to measure appropriate catheter length. A stiff Glidewire was advanced to the level of the IVC and the micropuncture sheath was exchanged for a peel-away sheath. A palindrome tunneled hemodialysis catheter measuring 19 cm from tip to cuff was tunneled in a retrograde fashion from the anterior chest wall to the venotomy incision. The catheter was then placed through the peel-away  sheath with tips ultimately positioned within the superior aspect of the right atrium. Final catheter positioning was confirmed and documented with a spot radiographic image. The catheter aspirates and flushes normally. The catheter was flushed with appropriate volume heparin dwells. The catheter exit site was secured with a 0-Prolene retention suture. The venotomy incision was closed with an interrupted 4-0 Vicryl, Dermabond and Steri-strips. Dressings were applied. The patient tolerated the procedure well without immediate post procedural complication. IMPRESSION: Successful placement of 19 cm tip to cuff tunneled hemodialysis catheter via the right subclavian (supraclavicular approach) vein with tips terminating within the superior aspect of the right atrium. The catheter is ready for immediate use. Electronically Signed   By: Jacqulynn Cadet M.D.   On: 07/06/2020 10:39   IR US Guide Vasc Access Right  Result Date: 07/06/2020 INDICATION: 70 year old female with acute on chronic kidney disease in need of hemodialysis. She presents for tunneled hemodialysis catheter placement. EXAM: TUNNELED CENTRAL VENOUS HEMODIALYSIS CATHETER PLACEMENT WITH ULTRASOUND AND FLUOROSCOPIC GUIDANCE MEDICATIONS: 1 g vancomycin. The antibiotic was given in an appropriate time interval prior to skin puncture. ANESTHESIA/SEDATION: Moderate (conscious) sedation was employed during this procedure. A total of Versed 1 mg and Fentanyl 50 mcg was administered intravenously. Moderate Sedation Time: 20 minutes. The patient's level of consciousness and vital signs were monitored continuously by radiology nursing throughout the procedure under my direct supervision. FLUOROSCOPY TIME:  Fluoroscopy Time: 0 minutes 54 seconds (4 mGy). COMPLICATIONS: None immediate. PROCEDURE: Informed written consent was obtained from the patient after a discussion of the risks, benefits, and alternatives to treatment. Questions regarding the procedure were  encouraged and answered. The right neck and chest were prepped with chlorhexidine in a sterile fashion, and a sterile drape was applied covering the operative field. Maximum barrier sterile technique with sterile gowns and gloves were used for the procedure. A timeout was performed prior to the initiation of the procedure. 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 The right subclavian vein was then interrogated with ultrasound using a supraclavicular approach was found to be widely patent. An image was obtained and stored for the medical record. After creating a small venotomy incision, a micropuncture kit was utilized to access the right subclavian vein under direct, real-time ultrasound guidance after the overlying soft tissues were anesthetized with 1% lidocaine with epinephrine. Ultrasound image documentation was  performed. The microwire was kinked to measure appropriate catheter length. A stiff Glidewire was advanced to the level of the IVC and the micropuncture sheath was exchanged for a peel-away sheath. A palindrome tunneled hemodialysis catheter measuring 19 cm from tip to cuff was tunneled in a retrograde fashion from the anterior chest wall to the venotomy incision. The catheter was then placed through the peel-away sheath with tips ultimately positioned within the superior aspect of the right atrium. Final catheter positioning was confirmed and documented with a spot radiographic image. The catheter aspirates and flushes normally. The catheter was flushed with appropriate volume heparin dwells. The catheter exit site was secured with a 0-Prolene retention suture. The venotomy incision was closed with an interrupted 4-0 Vicryl, Dermabond and Steri-strips. Dressings were applied. The patient tolerated the procedure well without immediate post procedural complication. IMPRESSION: Successful placement of 19 cm tip to cuff tunneled hemodialysis  catheter via the right subclavian (supraclavicular approach) vein with tips terminating within the superior aspect of the right atrium. The catheter is ready for immediate use. Electronically Signed   By: Jacqulynn Cadet M.D.   On: 07/06/2020 10:39   VAS [place] UPPER EXT VEIN MAPPING (PRE-OP AVF)  Result Date: 07/06/2020 UPPER EXTREMITY VEIN MAPPING  Indications: Pre-access. Comparison Study: No prior exams Performing Technologist: Rogelia Rohrer  Examination Guidelines: A complete evaluation includes B-mode imaging, spectral Doppler, color Doppler, and power Doppler as needed of all accessible portions of each vessel. Bilateral testing is considered an integral part of a complete examination. Limited examinations for reoccurring indications may be performed as noted. +-----------------+-------------+----------+---------+ Right Cephalic   Diameter (cm)Depth (cm)Findings  +-----------------+-------------+----------+---------+ Prox upper arm       0.35        0.87             +-----------------+-------------+----------+---------+ Mid upper arm        0.37        0.49             +-----------------+-------------+----------+---------+ Dist upper arm       0.45        0.52             +-----------------+-------------+----------+---------+ Antecubital fossa    0.55        0.23   branching +-----------------+-------------+----------+---------+ Prox forearm         0.36        0.56             +-----------------+-------------+----------+---------+ Mid forearm          0.33        0.51             +-----------------+-------------+----------+---------+ Dist forearm         0.32        0.33             +-----------------+-------------+----------+---------+ Wrist                0.28        0.29             +-----------------+-------------+----------+---------+ +-----------------+-------------+----------+---------+ Right Basilic    Diameter (cm)Depth (cm)Findings   +-----------------+-------------+----------+---------+ Shoulder             0.45                         +-----------------+-------------+----------+---------+ Prox upper arm       0.42                         +-----------------+-------------+----------+---------+  Mid upper arm        0.40                         +-----------------+-------------+----------+---------+ Dist upper arm       0.50               branching +-----------------+-------------+----------+---------+ Antecubital fossa    0.57               branching +-----------------+-------------+----------+---------+ Prox forearm         0.37                         +-----------------+-------------+----------+---------+ Mid forearm          0.36                         +-----------------+-------------+----------+---------+ Distal forearm       0.28               branching +-----------------+-------------+----------+---------+ Wrist                0.20                         +-----------------+-------------+----------+---------+ +-----------------+-------------+----------+---------------------+ Left Cephalic    Diameter (cm)Depth (cm)      Findings        +-----------------+-------------+----------+---------------------+ Prox upper arm       0.23        0.50                         +-----------------+-------------+----------+---------------------+ Mid upper arm        0.17        0.58                         +-----------------+-------------+----------+---------------------+ Dist upper arm       0.40        0.39         Thrombus        +-----------------+-------------+----------+---------------------+ Antecubital fossa    0.45        0.30         Thrombus        +-----------------+-------------+----------+---------------------+ Prox forearm         0.34        0.50         branching       +-----------------+-------------+----------+---------------------+ Mid forearm          0.34         0.73                         +-----------------+-------------+----------+---------------------+ Dist forearm         0.37        0.47                         +-----------------+-------------+----------+---------------------+ Wrist                                   not visualized and IV +-----------------+-------------+----------+---------------------+ +-----------------+-------------+----------+---------+ Left Basilic     Diameter (cm)Depth (cm)Findings  +-----------------+-------------+----------+---------+ Shoulder             0.93                         +-----------------+-------------+----------+---------+  Prox upper arm       0.37                         +-----------------+-------------+----------+---------+ Mid upper arm        0.35               branching +-----------------+-------------+----------+---------+ Dist upper arm       0.32                         +-----------------+-------------+----------+---------+ Antecubital fossa    0.36               branching +-----------------+-------------+----------+---------+ Prox forearm         0.23                         +-----------------+-------------+----------+---------+ Mid forearm          0.19                         +-----------------+-------------+----------+---------+ Distal forearm       0.15                         +-----------------+-------------+----------+---------+ Wrist                0.12                         +-----------------+-------------+----------+---------+ Summary:  Left: SVT of cephalic vein in area of distal upper arm and AC fossa       - previous site of IV. *See table(s) above for measurements and observations.  Diagnosing physician: Harold Barban MD Electronically signed by Harold Barban MD on 07/06/2020 at 9:36:31 PM.    Final     Scheduled Meds: . amiodarone  200 mg Oral Daily  . aspirin EC  81 mg Oral Daily  . atorvastatin  40 mg Oral q1800  . carvedilol  12.5  mg Oral BID WC  . Chlorhexidine Gluconate Cloth  6 each Topical Daily  . Chlorhexidine Gluconate Cloth  6 each Topical Q0600  . folic acid  3,614 mcg Oral Daily  . sodium bicarbonate  1,300 mg Oral TID   Continuous Infusions: . heparin 1,000 Units/hr (07/06/20 1414)     LOS: 6 days   Time spent: 28 minutes   Darliss Cheney, MD [facility]  If 7PM-7AM, please contact night-coverage [email or web address] 07/07/2020, 11:28 AM

## 2020-07-07 NOTE — Evaluation (Signed)
Physical Therapy Evaluation Patient Details Name: Kathryn Hancock MRN: [id number] DOB: 1951/03/15 Today's Date: 07/07/2020   History of Present Illness  Kathryn Hancock is a 70 y.o. female with history of left atrial myxoma, diabetes mellitus type 2, hypertension, chronic kidney disease stage III who had a left atrial myxoma resection and third week of December last month at [facility] during which patient had worsening renal function and lower extremity ischemic change and postoperative A. fib.  Clinical Impression   Patient received in bed, pleasant and cooperative with therapy. Able to mobilize on a min guard-MinA basis with VC for hand placement/sequencing and for sternal precautions with functional mobility. Tolerated gait training in hallway but antalgic and distance limited by fatigue and pain with BLEs in dependent position. Left up in recliner with all needs met, chair alarm active. Will benefit from [place] f/u at DC.     Follow Up Recommendations Home health PT    Equipment Recommendations  Rolling walker with 5" wheels;3in1 (PT)    Recommendations for Other Services       Precautions / Restrictions Precautions Precautions: Fall;Other (comment);Sternal Precaution Booklet Issued: No (verbally reviewed) Precaution Comments: no LE WB restrictions per secure chat Dr. Scot Dock 1/27; heart surgery Dec 2021 - sternal precautions Restrictions Weight Bearing Restrictions: No      Mobility  Bed Mobility Overal bed mobility: Needs Assistance Bed Mobility: Rolling;Sidelying to Sit Rolling: Modified independent (Device/Increase time) Sidelying to sit: Modified independent (Device/Increase time) Supine to sit: Supervision;HOB elevated Sit to supine: Supervision   General bed mobility comments: use of rail, no physical assist given    Transfers Overall transfer level: Needs assistance Equipment used: Rolling walker (2 wheeled) Transfers: Sit to/from Stand Sit to Stand: Min  assist         General transfer comment: MinA and multiple attempts to boost up to standing with hands on thighs, cues for hand placement and sequencing  Ambulation/Gait Ambulation/Gait assistance: Min guard Gait Distance (Feet): 70 Feet Assistive device: Rolling walker (2 wheeled) Gait Pattern/deviations: Step-through pattern;Trunk flexed;Antalgic Gait velocity: decreased   General Gait Details: slow and steady with RW, gait moderately antalgic due to B foot pain in dependent position  but able to tolerate gait  Stairs            Wheelchair Mobility    Modified Rankin (Stroke Patients Only)       Balance Overall balance assessment: Needs assistance Sitting-balance support: No upper extremity supported;Feet supported Sitting balance-Leahy Scale: Good     Standing balance support: Bilateral upper extremity supported;Single extremity supported;During functional activity Standing balance-Leahy Scale: Fair Standing balance comment: fair static standing, benefits from UE support for mobility                             Pertinent Vitals/Pain Pain Assessment: Faces Faces Pain Scale: Hurts even more Pain Location: B feet sitting EOB Pain Descriptors / Indicators: Grimacing;Shooting;Sharp Pain Intervention(s): Limited activity within patient's tolerance;Repositioned    Home Living Family/patient expects to be discharged to:: Private residence Living Arrangements: Children;Other (Comment) (2 grandsons (young adults)) Available Help at Discharge: Family;Available 24 hours/day Type of Home: House Home Access: Level entry (high threshold)     Home Layout: One level Home Equipment: Walker - 2 wheels;Tub bench;Hand held shower head;Bedside commode Additional Comments: Daughter lost job at beginning of pandemic. Provides 24/7 assist    Prior Function Level of Independence: Needs assistance   Gait / Transfers  Assistance Needed: Pt reports prior to surgery in  Dec 2021, no use of AD (did get dizzy and hold to wall but no falls). Reports after surgery, pt got RW from [place] / [employer] Assistance Needed: Prior to heart surgery dec 2021, pt able to shower and dress self but required rest breaks due to LE pain. After surgery, daughter assisted with showers, transfers, and dressing as needed. Daughter drives and assists with IADLs        Hand Dominance   Dominant Hand: Right    Extremity/Trunk Assessment   Upper Extremity Assessment Upper Extremity Assessment: Defer to OT evaluation    Lower Extremity Assessment Lower Extremity Assessment: Generalized weakness (necrotic toes, wounds on heels)    Cervical / Trunk Assessment Cervical / Trunk Assessment: Normal  Communication   Communication: No difficulties  Cognition Arousal/Alertness: Awake/alert Behavior During Therapy: WFL for tasks assessed/performed Overall Cognitive Status: Within Functional Limits for tasks assessed                                 General Comments: A&Ox4, pleasant and cooperative      General Comments General comments (skin integrity, edema, etc.): still with staples at sternal surgical site. Reviewed sternal precautions. OT had already ordered prevalon boots    Exercises     Assessment/Plan    PT Assessment Patient needs continued PT services  PT Problem List Decreased strength;Decreased knowledge of use of DME;Decreased activity tolerance;Decreased safety awareness;Decreased knowledge of precautions;Pain;Decreased mobility;Decreased coordination       PT Treatment Interventions DME instruction;Balance training;Gait training;Stair training;Functional mobility training;Patient/family education;Therapeutic activities;Therapeutic exercise    PT Goals (Current goals can be found in the Care Plan section)  Acute Rehab PT Goals Patient Stated Goal: go home, pain control of LEs PT Goal Formulation: With patient Time For Goal Achievement:  07/21/20 Potential to Achieve Goals: Good    Frequency Min 3X/week   Barriers to discharge        Co-evaluation               AM-PAC PT "6 Clicks" Mobility  Outcome Measure Help needed turning from your back to your side while in a flat bed without using bedrails?: None Help needed moving from lying on your back to sitting on the side of a flat bed without using bedrails?: None Help needed moving to and from a bed to a chair (including a wheelchair)?: A Little Help needed standing up from a chair using your arms (e.g., wheelchair or bedside chair)?: A Little Help needed to walk in hospital room?: A Little Help needed climbing 3-5 steps with a railing? : A Little 6 Click Score: 20    End of Session Equipment Utilized During Treatment: Gait belt Activity Tolerance: Patient tolerated treatment well Patient left: in chair;with call bell/phone within reach;with chair alarm set Nurse Communication: Mobility status PT Visit Diagnosis: Difficulty in walking, not elsewhere classified (R26.2);Pain;Muscle weakness (generalized) (M62.81) Pain - Right/Left:  (bilateral) Pain - part of body: Ankle and joints of foot    Time: 5009-3818 PT Time Calculation (min) (ACUTE ONLY): 21 min   Charges:   PT Evaluation $PT Eval Moderate Complexity: 1 Mod          Windell Norfolk, DPT, PN1   Supplemental Physical Therapist [place]    Pager [phone] Acute Rehab Office [phone]

## 2020-07-08 ENCOUNTER — Encounter (HOSPITAL_COMMUNITY)
Admission: EM | Disposition: A | Discharge status: Home Health | Payer: Self-pay | Source: Home / Self Care | Attending: Family Medicine

## 2020-07-08 ENCOUNTER — Ambulatory Visit (HOSPITAL_COMMUNITY): Admission: RE | Admit: 2020-07-08 | Payer: Medicare Other | Source: Home / Self Care | Admitting: Vascular Surgery

## 2020-07-08 ENCOUNTER — Encounter (HOSPITAL_COMMUNITY): Payer: Self-pay | Admitting: Vascular Surgery

## 2020-07-08 DIAGNOSIS — I998 Other disorder of circulatory system: Secondary | ICD-10-CM | POA: Diagnosis not present

## 2020-07-08 DIAGNOSIS — I779 Disorder of arteries and arterioles, unspecified: Secondary | ICD-10-CM | POA: Insufficient documentation

## 2020-07-08 DIAGNOSIS — N179 Acute kidney failure, unspecified: Secondary | ICD-10-CM | POA: Diagnosis not present

## 2020-07-08 DIAGNOSIS — I714 Abdominal aortic aneurysm, without rupture: Secondary | ICD-10-CM | POA: Diagnosis not present

## 2020-07-08 HISTORY — PX: ABDOMINAL AORTOGRAM W/LOWER EXTREMITY: CATH118223

## 2020-07-08 LAB — GLUCOSE, CAPILLARY
Glucose-Capillary: 86 mg/dL (ref 70–99)
Glucose-Capillary: 90 mg/dL (ref 70–99)
Glucose-Capillary: 86 mg/dL (ref 70–99)
Glucose-Capillary: 125 mg/dL — ABNORMAL HIGH (ref 70–99)
Glucose-Capillary: 104 mg/dL — ABNORMAL HIGH (ref 70–99)

## 2020-07-08 LAB — CBC
HCT: 20.2 % — ABNORMAL LOW (ref 36.0–46.0)
Hemoglobin: 6.6 g/dL — CL (ref 12.0–15.0)
MCH: 32.8 pg (ref 26.0–34.0)
MCHC: 32.7 g/dL (ref 30.0–36.0)
MCV: 100.5 fL — ABNORMAL HIGH (ref 80.0–100.0)
Platelets: 123 10*3/uL — ABNORMAL LOW (ref 150–400)
RBC: 2.01 MIL/uL — ABNORMAL LOW (ref 3.87–5.11)
RDW: 15.7 % — ABNORMAL HIGH (ref 11.5–15.5)
WBC: 6.5 10*3/uL (ref 4.0–10.5)
nRBC: 0 % (ref 0.0–0.2)

## 2020-07-08 LAB — BASIC METABOLIC PANEL
Anion gap: 12 (ref 5–15)
BUN: 37 mg/dL — ABNORMAL HIGH (ref 8–23)
CO2: 25 mmol/L (ref 22–32)
Calcium: 8.3 mg/dL — ABNORMAL LOW (ref 8.9–10.3)
Chloride: 103 mmol/L (ref 98–111)
Creatinine, Ser: 4.45 mg/dL — ABNORMAL HIGH (ref 0.44–1.00)
GFR, Estimated: 10 mL/min — ABNORMAL LOW (ref >60)
Glucose, Bld: 85 mg/dL (ref 70–99)
Potassium: 3.7 mmol/L (ref 3.5–5.1)
Sodium: 140 mmol/L (ref 135–145)

## 2020-07-08 LAB — HEPATITIS B SURFACE ANTIGEN: Hepatitis B Surface Ag: NONREACTIVE

## 2020-07-08 LAB — ABO/RH: ABO/RH(D): O POS

## 2020-07-08 LAB — HEPATITIS B SURFACE ANTIBODY, QUANTITATIVE: Hep B S AB Quant (Post): <3.1 m[IU]/mL — ABNORMAL LOW (ref >9.9)

## 2020-07-08 LAB — HEPATITIS B SURFACE ANTIBODY,QUALITATIVE: Hep B S Ab: NONREACTIVE

## 2020-07-08 LAB — APTT: aPTT: 79 seconds — ABNORMAL HIGH (ref 24–36)

## 2020-07-08 LAB — HEPARIN LEVEL (UNFRACTIONATED): Heparin Unfractionated: 0.95 IU/mL — ABNORMAL HIGH (ref 0.30–0.70)

## 2020-07-08 LAB — PREPARE RBC (CROSSMATCH)

## 2020-07-08 LAB — HEPATITIS B CORE ANTIBODY, TOTAL: Hep B Core Total Ab: NONREACTIVE

## 2020-07-08 SURGERY — ABDOMINAL AORTOGRAM W/LOWER EXTREMITY
Anesthesia: LOCAL

## 2020-07-08 MED ORDER — LIDOCAINE HCL (PF) 1 % IJ SOLN: 5.0000 mL | INTRAMUSCULAR | Status: DC | PRN

## 2020-07-08 MED ORDER — LIDOCAINE HCL (PF) 1 % IJ SOLN
INTRAMUSCULAR | Status: DC | PRN
  Administered 2020-07-08: 30 mL

## 2020-07-08 MED ORDER — ACETAMINOPHEN 650 MG RE SUPP: 650.0000 mg | Freq: Four times a day (QID) | RECTAL | Status: DC | PRN

## 2020-07-08 MED ORDER — HEPARIN (PORCINE) IN NACL 1000-0.9 UT/500ML-% IV SOLN
INTRAVENOUS | Status: AC
  Filled 2020-07-08: qty 500

## 2020-07-08 MED ORDER — LIDOCAINE-PRILOCAINE 2.5-2.5 % EX CREA: 1.0000 "application " | TOPICAL_CREAM | CUTANEOUS | Status: DC | PRN

## 2020-07-08 MED ORDER — MIDAZOLAM HCL 2 MG/2ML IJ SOLN
INTRAMUSCULAR | Status: AC
  Filled 2020-07-08: qty 2

## 2020-07-08 MED ORDER — ACETAMINOPHEN 325 MG PO TABS: 650.0000 mg | ORAL_TABLET | ORAL | Status: DC | PRN

## 2020-07-08 MED ORDER — SODIUM CHLORIDE 0.9% FLUSH
3.0000 mL | Freq: Two times a day (BID) | INTRAVENOUS | Status: DC
  Administered 2020-07-08 – 2020-07-15 (×9): 3 mL via INTRAVENOUS

## 2020-07-08 MED ORDER — HEPARIN SODIUM (PORCINE) 1000 UNIT/ML DIALYSIS: 1000.0000 [IU] | INTRAMUSCULAR | Status: DC | PRN

## 2020-07-08 MED ORDER — ONDANSETRON HCL 4 MG/2ML IJ SOLN
4.0000 mg | Freq: Four times a day (QID) | INTRAMUSCULAR | Status: DC | PRN
  Administered 2020-07-10: 4 mg via INTRAVENOUS
  Filled 2020-07-08 (×2): qty 2

## 2020-07-08 MED ORDER — HYDRALAZINE HCL 20 MG/ML IJ SOLN: 5.0000 mg | INTRAMUSCULAR | Status: DC | PRN

## 2020-07-08 MED ORDER — HEPARIN (PORCINE) 25000 UT/250ML-% IV SOLN
1000.0000 [IU]/h | INTRAVENOUS | Status: DC
  Administered 2020-07-08 – 2020-07-10 (×2): 1000 [IU]/h via INTRAVENOUS
  Filled 2020-07-08 (×3): qty 250

## 2020-07-08 MED ORDER — FENTANYL CITRATE (PF) 100 MCG/2ML IJ SOLN
INTRAMUSCULAR | Status: DC | PRN
  Administered 2020-07-08: 50 ug via INTRAVENOUS

## 2020-07-08 MED ORDER — SODIUM CHLORIDE 0.9% IV SOLUTION: Freq: Once | INTRAVENOUS | Status: AC

## 2020-07-08 MED ORDER — HEPARIN (PORCINE) IN NACL 1000-0.9 UT/500ML-% IV SOLN
INTRAVENOUS | Status: DC | PRN
  Administered 2020-07-08: 500 mL

## 2020-07-08 MED ORDER — MIDAZOLAM HCL 2 MG/2ML IJ SOLN
INTRAMUSCULAR | Status: DC | PRN
  Administered 2020-07-08: 1 mg via INTRAVENOUS

## 2020-07-08 MED ORDER — LABETALOL HCL 5 MG/ML IV SOLN: 10.0000 mg | INTRAVENOUS | Status: DC | PRN

## 2020-07-08 MED ORDER — IODIXANOL 320 MG/ML IV SOLN
INTRAVENOUS | Status: DC | PRN
  Administered 2020-07-08: 96 mL via INTRA_ARTERIAL

## 2020-07-08 MED ORDER — ALTEPLASE 2 MG IJ SOLR: 2.0000 mg | Freq: Once | INTRAMUSCULAR | Status: DC | PRN

## 2020-07-08 MED ORDER — SODIUM CHLORIDE 0.9 % IV SOLN: 100.0000 mL | INTRAVENOUS | Status: DC | PRN

## 2020-07-08 MED ORDER — PENTAFLUOROPROP-TETRAFLUOROETH EX AERO: 1.0000 "application " | INHALATION_SPRAY | CUTANEOUS | Status: DC | PRN

## 2020-07-08 MED ORDER — LIDOCAINE HCL (PF) 1 % IJ SOLN
INTRAMUSCULAR | Status: AC
  Filled 2020-07-08: qty 30

## 2020-07-08 MED ORDER — SODIUM CHLORIDE 0.9% FLUSH: 3.0000 mL | INTRAVENOUS | Status: DC | PRN

## 2020-07-08 MED ORDER — ACETAMINOPHEN 325 MG PO TABS
650.0000 mg | ORAL_TABLET | Freq: Four times a day (QID) | ORAL | Status: DC | PRN
  Administered 2020-07-09 – 2020-07-13 (×3): 650 mg via ORAL
  Filled 2020-07-08 (×3): qty 2

## 2020-07-08 MED ORDER — SODIUM CHLORIDE 0.9 % IV SOLN: 250.0000 mL | INTRAVENOUS | Status: DC | PRN

## 2020-07-08 MED ORDER — FENTANYL CITRATE (PF) 100 MCG/2ML IJ SOLN
INTRAMUSCULAR | Status: AC
  Filled 2020-07-08: qty 2

## 2020-07-08 SURGICAL SUPPLY — 11 items
CATH ANGIO 5F PIGTAIL 65CM (CATHETERS) ×2 IMPLANT
KIT MICROPUNCTURE NIT STIFF (SHEATH) ×2 IMPLANT
KIT PV (KITS) ×2 IMPLANT
SHEATH PINNACLE 5F 10CM (SHEATH) ×2 IMPLANT
SHEATH PROBE COVER 6X72 (BAG) ×2 IMPLANT
STOPCOCK MORSE 400PSI 3WAY (MISCELLANEOUS) ×2 IMPLANT
SYR MEDRAD MARK 7 150ML (SYRINGE) ×2 IMPLANT
TRANSDUCER W/STOPCOCK (MISCELLANEOUS) ×2 IMPLANT
TRAY PV CATH (CUSTOM PROCEDURE TRAY) ×2 IMPLANT
TUBING CIL FLEX 10 FLL-RA (TUBING) ×2 IMPLANT
WIRE HITORQ VERSACORE ST 145CM (WIRE) ×2 IMPLANT

## 2020-07-08 NOTE — Progress Notes (Signed)
ANTICOAGULATION CONSULT NOTE - Follow Up Consult  Pharmacy Consult for heparin Indication: atrial fibrillation  Allergies  Allergen Reactions  . Penicillins Other (See Comments)    Childhood reaction.    Patient Measurements: Height: 5\' 3"  (160 cm) Weight: 69.9 kg (154 lb 1.6 oz) IBW/kg (Calculated) : 52.4  Vital Signs: Temp: 98.8 F (37.1 C) (01/28 0509) Temp Source: Oral (01/28 0509) BP: 126/52 (01/28 0509) Pulse Rate: 65 (01/28 0509)  Labs: Recent Labs    07/06/20 0026 07/07/20 0323 07/07/20 1420 07/08/20 0500  HGB 7.3* 7.0*  --  6.6*  HCT 22.3* 22.4*  --  20.2*  PLT 145* 133*  --  123*  APTT 95* 92*  --  79*  HEPARINUNFRC 1.36* 1.04*  --  0.95*  CREATININE 6.04*  --  6.17* 4.45*    Estimated Creatinine Clearance: 11.5 mL/min (A) (by C-G formula based on SCr of 4.45 mg/dL (H)).  Assessment: 70 yo female went to PCP for N/V and found to have "abnormal labs" and sent to ED, found to be w/ AKI (baseline Scr 1.2, now >6), to transition from Eliquis for Afib to UFH; last dose of Eliquis taken 1/20 10a.  PTT within therapeutic range, Heparin level still falsely elevated. H/H >7.0/22.4 > 6.6>20.2, low stable, Plts 133>123  Starting HD, vascular following - > planning procedure 1/28   Goal of Therapy:  Heparin level 0.3-0.7 units/ml  aPTT 66-102 seconds Monitor platelets by anticoagulation protocol: Yes   Plan:  Continue heparin at 1000 units / hr Follow up AM labs  Thank you Nicole Cella, RPh Clinical Pharmacist 07/08/2020 8:56 AM

## 2020-07-08 NOTE — Op Note (Signed)
   PATIENT: Kathryn Hancock      MRN: [id number] DOB: 01/17/1951    DATE OF PROCEDURE: 07/08/2020  INDICATIONS:    Kathryn Hancock is a 70 y.o. female who presented with ischemic changes to both feet. 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 Therefore we proceeded with arteriography.  PROCEDURE:    1. Conscious sedation 2. Ultrasound-guided access to the left common femoral artery 3. Aortogram with bilateral iliac arteriogram and bilateral lower extremity runoff 4. Mynx closure of left common femoral artery  SURGEON: Judeth Cornfield. Scot Dock, MD, FACS  ANESTHESIA: Local with sedation  EBL: Minimal  TECHNIQUE: The patient was brought to the peripheral vascular lab and was sedated. The period of conscious sedation was 28 minutes.  During that time period, I was present face-to-face 100% of the time.  The patient was administered 1 mg of Versed and 50 mcg of fentanyl. The patient's heart rate, blood pressure, and oxygen saturation were monitored by the nurse continuously during the procedure.  Both groins were prepped and draped in the usual sterile fashion.  Under ultrasound guidance, after the skin was anesthetized, I cannulated the left common femoral artery with a micropuncture needle and a micropuncture sheath was introduced over a wire.  This was exchanged for a 5 [place] sheath over a Bentson wire.  By ultrasound the femoral artery was patent. A real-time image was obtained and sent to the server.  A pigtail catheter was positioned at the L1 vertebral body and flush aortogram obtained. The catheter was in position above the aortic bifurcation and bilateral lower extremity runoff films were obtained. The catheter was then removed over a wire. The sheath was flushed. The minx closure device was deployed without difficulty and there was good hemostasis.  FINDINGS:   1. There are 2 renal arteries on the right and single renal artery on the  left. The patient has a known pararenal aneurysm.  2. On the right side, the common iliac, external iliac, and hypogastric arteries are patent. The common femoral and deep femoral artery are patent. The superficial femoral artery is occluded at its origin with reconstitution of the above-knee popliteal artery and two-vessel runoff via the peroneal and posterior tibial arteries. The anterior tibial artery on the right is occluded. 3. On the left side the common, internal, and external iliac arteries are patent. The common femoral and deep femoral artery are patent. The superficial femoral artery is occluded at its origin. There is reconstitution of the above-knee popliteal artery with two-vessel runoff via the posterior tibial and peroneal arteries. The anterior tibial artery on the left is occluded.  CLINICAL NOTE: This patient has bilateral superficial femoral artery occlusions. Currently her toes are stable. If she developed worsening wounds on her feet her best chance for limb salvage would be staged bilateral femoral to above-knee popliteal artery bypass graft. Currently given that she is just started dialysis with multiple medical comorbidities we will not plan revascularization unless the wounds progress.   Kathryn Mayo, MD, FACS [facility]  DATE OF DICTATION:   07/08/2020

## 2020-07-08 NOTE — Progress Notes (Addendum)
ANTICOAGULATION CONSULT NOTE - Follow Up Consult  Pharmacy Consult for heparin Indication: atrial fibrillation  Allergies  Allergen Reactions  . Penicillins Other (See Comments)    Childhood reaction.    Patient Measurements: Height: 5\' 3"  (160 cm) Weight: 69.9 kg (154 lb 1.6 oz) IBW/kg (Calculated) : 52.4  Vital Signs: Temp: 99 F (37.2 C) (01/28 1228) Temp Source: Oral (01/28 1228) BP: 124/53 (01/28 1228) Pulse Rate: 63 (01/28 1228)  Labs: Recent Labs    07/06/20 0026 07/07/20 0323 07/07/20 1420 07/08/20 0500  HGB 7.3* 7.0*  --  6.6*  HCT 22.3* 22.4*  --  20.2*  PLT 145* 133*  --  123*  APTT 95* 92*  --  79*  HEPARINUNFRC 1.36* 1.04*  --  0.95*  CREATININE 6.04*  --  6.17* 4.45*    Estimated Creatinine Clearance: 11.5 mL/min (A) (by C-G formula based on SCr of 4.45 mg/dL (H)).  Assessment: 70 yo female went to PCP for N/V and found to have "abnormal labs" and sent to ED, found to be w/ AKI (baseline Scr 1.2, now >6), to transition from Eliquis for Afib to UFH; last dose of Eliquis taken 1/20 10a.  PTT within therapeutic range, Heparin level still falsely elevated. H/H >7.0/22.4 > 6.6>20.2, low stable, Plts 133>123  Plan to start on HD, vascular following - > now s/p Arteriogram   1/28.   Pharmacy consulted to restart IV heparin 8 hour post sheath removal.   Patient back to unit 86M at 12:30  Goal of Therapy:  Heparin level 0.3-0.7 units/ml  aPTT 66-102 seconds Monitor platelets by anticoagulation protocol: Yes   Plan:  Restart heparin drip today at 18:00 at rate of 1000 units / hr Follow up daily aPTT and CBC.  Thank you Nicole Cella, RPh Clinical Pharmacist 07/08/2020 1:58 PM

## 2020-07-08 NOTE — Interval H&P Note (Signed)
History and Physical Interval Note:  07/08/2020 9:23 AM  Kathryn Hancock  has presented today for surgery, with the diagnosis of PAD.  The various methods of treatment have been discussed with the patient and family. After consideration of risks, benefits and other options for treatment, the patient has consented to  Procedure(s): ABDOMINAL AORTOGRAM W/LOWER EXTREMITY (N/A) as a surgical intervention.  The patient's history has been reviewed, patient examined, no change in status, stable for surgery.  I have reviewed the patient's chart and labs.  Questions were answered to the patient's satisfaction.     Deitra Mayo

## 2020-07-08 NOTE — Progress Notes (Signed)
PT Cancellation Note  Patient Details Name: Kathryn Hancock MRN: [id number] DOB: 28-Nov-1950   Cancelled Treatment:    Reason Eval/Treat Not Completed: Medical issues which prohibited therapy hgb 6.6/HCT 20.2. Will follow and return later if time/schedule allow and if she is medically ready.   Windell Norfolk, DPT, PN1   Supplemental Physical Therapist [facility]    Pager [phone] Acute Rehab Office [phone]

## 2020-07-08 NOTE — Progress Notes (Signed)
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  During her prior hospital stay at [facility] patient had worsening renal function and lower extremity ischemic change and postoperative A. fib. 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 Was instructed to repeat and the repeat on showed worsening and was advised to come to the ER. Patient states over the last 2 weeks patient has been worsening nausea vomiting unable to keep anything but denies any abdominal pain or diarrhea.  Subjective / 24h Interval events: No complaints, awaiting arteriogram  Assessment & Plan: Principal Problem Acute kidney injury on chronic kidney disease stage IIIb/metabolic acidosis -Possibly in the setting of atheroembolic disease, kidney function is worsening after recent surgery for left atrial myxoma.  Nephrology consulted, now started on dialysis, underwent catheter placement on 1/26.  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  If she is worse she would need staged bilateral femoral to above-knee popliteal artery bypass graft  Nausea/vomiting -Could be secondary from uremia, improving.  Benign abdominal exam.  CT renal studies shows no obstruction/hydronephrosis/pyelonephritis. -Continue Zofran as needed for nausea and vomiting.  Hyperkalemia -In the setting of worsening kidney function.   -Resolved after Lokelma.  Paroxysmal A. Fib -Rate control -Continue Coreg and amiodarone.  Hold Eliquis  in the setting of worsening kidney function.  Continue heparin as per pharmacy.  Hypertension -Stable.  Continue amlodipine and Coreg  Type 2 diabetes mellitus -Well controlled.  A1c 5.6% SSI discontinued due to hypoglycemia.  Anemia of chronic disease -Macrocytosis.  H&H trended down and now 6.6.  Transfuse a unit of packed red blood cells.  No active bleeding  Recent CABG for left atrial myxoma at [facility]: -In December 2021.  Patient denies an chest pain or shortness of breath.  Continue to monitor -Continue aspirin, statin  History of abdominal aortic aneurysm  -Reviewed CT renal study showed unchanged from prior exam back to January 15/2021.  Tobacco abuse -smokes 1 pack of cigarettes per day-tells me that she is working on cessation.  Cuts back from 2 packs to 1 pack.   Scheduled Meds: . [MAR Hold] sodium chloride   Intravenous Once  . [MAR Hold] amiodarone  200 mg Oral Daily  . [MAR Hold] aspirin EC  81 mg Oral Daily  . [MAR Hold] atorvastatin  40 mg Oral q1800  . [MAR Hold] carvedilol  12.5 mg Oral BID WC  . [MAR Hold] Chlorhexidine Gluconate Cloth  6 each Topical Daily  . [MAR Hold] Chlorhexidine Gluconate Cloth  6 each Topical Q0600  . [MAR Hold] folic acid  9,629 mcg Oral Daily  . [MAR Hold] sodium bicarbonate  1,300 mg Oral TID   Continuous Infusions: . heparin 1,000 Units/hr (07/07/20 1427)   PRN Meds:.[MAR Hold] acetaminophen **OR** [MAR Hold] acetaminophen, [MAR Hold] oxyCODONE  Diet Orders (From admission, onward)    Start     Ordered   07/08/20 0415  Diet NPO time specified Except for: Sips with Meds  Diet effective now       Question:  Except for  AnswerFerrel Logan with Meds   07/08/20 0414          DVT prophylaxis:      Code Status: Full Code  Family Communication: none   Status is: Inpatient  Remains inpatient appropriate because:Inpatient level of care appropriate due to severity of illness   Dispo: The patient is from: Home               Anticipated d/c is to: Home              Anticipated d/c date is: 2 days              Patient currently is not medically stable to d/c.   Difficult to place patient No  Level of care: Telemetry Medical  Consultants:  Nephrology  Vascular surgery   Procedures:  Arteriogram   Microbiology  None   Antimicrobials: None     Objective: Vitals:   07/08/20 1010 07/08/20 1015 07/08/20 1025 07/08/20 1040  BP:   (!) 129/50 (!) 131/46  Pulse: 65 (!) 0 62 (!) 59  Resp: 15 (!) 0 12 13  Temp:      TempSrc:      SpO2: (!) 0% (!) 0% 92% 93%  Weight:      Height:        Intake/Output Summary (Last 24 hours) at 07/08/2020 1102 Last data filed at 07/08/2020 0829 Gross per 24 hour  Intake 479.86 ml  Output 1100 ml  Net -620.14 ml   Filed Weights   07/06/20 0422 07/07/20 0700 07/08/20 0500  Weight: 69.6 kg 69.9 kg 69.9 kg    Examination:  Constitutional: NAD Eyes: no scleral icterus ENMT: Mucous membranes are moist.  Neck: normal, supple Respiratory: clear to auscultation bilaterally, no wheezing, no crackles. Normal respiratory effort.  Cardiovascular: Regular rate and rhythm, no murmurs / rubs / gallops. Abdomen: non distended, no tenderness. Bowel sounds positive.  Musculoskeletal: no clubbing / cyanosis.  Skin: no rashes, gangrenous changes bilateral lower toes Neurologic: CN 2-12 grossly intact. Strength 5/5 in all 4.   Data Reviewed: I have independently reviewed following labs and imaging studies   CBC: Recent Labs  Lab 07/04/20 0841 07/05/20 0329 07/06/20 0026 07/07/20 0323 07/08/20 0500  WBC 7.4 7.4 7.6 7.3 6.5  HGB 7.4* 7.8* 7.3* 7.0* 6.6*  HCT 22.4* 25.3* 22.3* 22.4* 20.2*  MCV 99.1 101.6* 100.0 102.3* 100.5*  PLT 157 158 145* 133* 030*   Basic Metabolic Panel: Recent Labs  Lab 07/02/20 0434 07/03/20 0320 07/04/20 0841 07/05/20 0329 07/06/20 0026 07/07/20 1420 07/08/20 0500  NA 138 138 137  137 137 138 137 140  K 4.6 4.9 4.4  4.3  4.2 4.5 4.1 3.7  CL 107 110 102  101 101 102 103 103  CO2 18* 14* 21*  22 22 24 23 25   GLUCOSE 98 69* 118*  124* 90 98 143* 85  BUN 86* 79* 72*  70* 70* 68* 65* 37*  CREATININE 6.30* 5.92* 5.70*  5.82* 6.09* 6.04* 6.17* 4.45*  CALCIUM 8.4* 8.4* 8.6*  8.7* 8.7* 8.8* 8.3* 8.3*  PHOS 6.9* 6.2* 5.3* 5.4*  --  5.1*  --    Liver Function Tests: Recent Labs  Lab 07/02/20 0434 07/03/20 0320 07/04/20 0841 07/05/20 0329 07/07/20 1420  ALBUMIN 2.5* 2.5* 2.5* 2.5* 2.2*   Coagulation Profile: No results for input(s): INR, PROTIME in the last 168 hours. HbA1C: No results for input(s): HGBA1C in the last 72 hours. CBG: Recent Labs  Lab 07/07/20 1202 07/07/20 1819 07/07/20 2152 07/08/20 0733 07/08/20 1026  GLUCAP 164* 75 146* 86 90    Recent Results (from the past 240 hour(s))  SARS CORONAVIRUS 2 (TAT 6-24 HRS) Nasopharyngeal Nasopharyngeal Swab     Status: None   Collection Time: 07/01/20  5:46 AM   Specimen: Nasopharyngeal Swab  Result Value Ref Range Status   SARS Coronavirus 2 NEGATIVE NEGATIVE Final    Comment: (NOTE) SARS-CoV-2 target nucleic acids are NOT DETECTED.  The SARS-CoV-2 RNA is generally detectable in upper and lower respiratory specimens during the acute phase of infection. Negative results do not preclude SARS-CoV-2 infection, do not rule out co-infections with other pathogens, and should not be used as the sole basis for treatment or other patient management decisions. Negative results must be combined with clinical observations, patient history, and epidemiological information. The expected result is Negative.  Fact Sheet for Patients: SugarRoll.be  Fact Sheet for Healthcare Providers: https://www.woods-mathews.com/  This test is not yet approved or cleared by the [place] FDA and  has been authorized for detection and/or diagnosis of SARS-CoV-2 by FDA under an Emergency Use Authorization (EUA). This  EUA will remain  in effect (meaning this test can be used) for the duration of the COVID-19 declaration under Se ction 564(b)(1) of the Act, 21 U.S.C. section 360bbb-3(b)(1), unless the authorization is terminated or revoked sooner.  Performed at [facility], [place] [address], [place], [place] [address]      Radiology Studies: PERIPHERAL VASCULAR CATHETERIZATION  Result Date: 07/08/2020 PATIENT: Karenann Mcgrory      MRN: [id number] DOB: 1951-02-15    DATE OF PROCEDURE: 07/08/2020 INDICATIONS:  Eimi Viney is a 70 y.o. female who presented with ischemic changes to both feet. 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 Therefore we proceeded with arteriography. PROCEDURE:  1. Conscious sedation 2. Ultrasound-guided access to the left common femoral artery 3. Aortogram with bilateral iliac arteriogram and bilateral lower extremity runoff 4. Mynx closure of left common femoral artery SURGEON: Judeth Cornfield. Scot Dock, MD, FACS ANESTHESIA: Local with sedation EBL: Minimal TECHNIQUE: The patient was brought to the peripheral vascular lab and was sedated. The period of conscious sedation was 28 minutes.  During that time period, I was present face-to-face 100% of the time.  The patient was administered 1 mg of Versed and 50 mcg of fentanyl. The patient's heart rate, blood pressure, and oxygen saturation were monitored by the nurse continuously during the procedure. Both groins were prepped and draped in the usual sterile fashion.  Under ultrasound guidance, after the skin was anesthetized, I cannulated the left common femoral artery with a micropuncture needle and a micropuncture sheath was introduced over a wire.  This was exchanged for a 5 [place] sheath over a Bentson wire.  By ultrasound the femoral artery was patent. A real-time image was obtained and sent to the server. A pigtail catheter was positioned at the L1 vertebral body and flush aortogram  obtained. The catheter was in position above the aortic bifurcation and bilateral lower extremity runoff films were obtained. The catheter was then removed over a wire. The sheath was flushed. The minx closure device was deployed without difficulty and there was good hemostasis. FINDINGS: 1. There are 2 renal arteries on the right and single renal artery on the left. The patient has a known pararenal aneurysm. 2. On the right side, the common iliac, external iliac, and hypogastric arteries are  patent. The common femoral and deep femoral artery are patent. The superficial femoral artery is occluded at its origin with reconstitution of the above-knee popliteal artery and two-vessel runoff via the peroneal and posterior tibial arteries. The anterior tibial artery on the right is occluded. 3. On the left side the common, internal, and external iliac arteries are patent. The common femoral and deep femoral artery are patent. The superficial femoral artery is occluded at its origin. There is reconstitution of the above-knee popliteal artery with two-vessel runoff via the posterior tibial and peroneal arteries. The anterior tibial artery on the left is occluded. CLINICAL NOTE: This patient has bilateral superficial femoral artery occlusions. Currently her toes are stable. If she developed worsening wounds on her feet her best chance for limb salvage would be staged bilateral femoral to above-knee popliteal artery bypass graft. Currently given that she is just started dialysis with multiple medical comorbidities we will not plan revascularization unless the wounds progress. Deitra Mayo, MD, FACS [facility] DATE OF DICTATION:   07/08/2020  Marzetta Board, MD, PhD [facility]  Between 7 am - 7 pm I am available, please contact me via Amion or Securechat  Between 7 pm - 7 am I am not available, please contact night coverage MD/APP via Amion

## 2020-07-08 NOTE — Plan of Care (Signed)
  Problem: Clinical Measurements: Goal: Diagnostic test results will improve Outcome: Progressing   Problem: Coping: Goal: Level of anxiety will decrease Outcome: Progressing   Problem: Education: Goal: Knowledge of disease and its progression will improve Outcome: Progressing

## 2020-07-08 NOTE — Progress Notes (Signed)
[facility] NEPHROLOGY PROGRESS NOTE  Assessment/ Plan: Pt is a 70 y.o. 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 The creatinine level was 4.1 during outpatient nephrology visit on 1/5. Sent by her PCP for worsening renal function.   #Acute kidney injury on CKD: Recent progressive kidney disease after cardiac surgery thought to be due to atheroembolic disease. The acute worsening probably due to hemodynamic change related with hypotension and decreased oral intake.  UA bland and doppler kidney US without RAS. CT scan ruled out obstruction.   Renal function continued to worsen despite supportive care.  We had a long discussion re: options and she wishes to proceed with dialysis.  Both parents did it (deceased 1992/08/19 and 07/30/2022).  I told her it may be temporary but more likely is it will be long term given the trajectory and recent events.   [place] placed by IR 1/27, HD #1 1/27.  Vein map.    #Metabolic acidosis: cont po bicarb 1300 TID.  d/c given HD.    #Hyperkalemia: Improved after medical treatment.    # Hypertension: Blood pressure ok.  Discontinued amlodipine. Continue beta-blocker because of A. fib.    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  On heparin gtt currently.    # A fib:  On heparin gtt for now.   #thrombocytopenia:  Plt in 120s, no bleeding noted. Monitor for nwo.   Subjective: Seen and examined.  Chart reviewed.  Urine output 1100 cc. She feels ok but is tired.   Tolerated HD #1 yesterday without issue. Nausea improved. LE angiogram today.  Objective Vital signs in last 24 hours: Vitals:   07/07/20 1740 07/07/20 08-19-2005 07/08/20 0500 07/08/20 0509  BP: (!) 119/58 (!) 125/49  (!) 126/52  Pulse: 66 71  65  Resp:  16 18  20   Temp: 98.2 F (36.8 C) 98.5 F (36.9 C)  98.8 F (37.1 C)  TempSrc: Oral Oral  Oral  SpO2: 94% 95%  93%  Weight:   69.9 kg   Height:       Weight change: 0 kg  Intake/Output Summary (Last 24 hours) at 07/08/2020 0748 Last data filed at 07/08/2020 0601 Gross per 24 hour  Intake 479.86 ml  Output 1100 ml  Net -620.14 ml       Labs: Basic Metabolic Panel: Recent Labs  Lab 07/04/20 0841 07/05/20 0329 07/06/20 0026 07/07/20 1420 07/08/20 0500  NA 137  137 137 138 137 140  K 4.4  4.3 4.2 4.5 4.1 3.7  CL 102  101 101 102 103 103  CO2 21*  22 22 24 23 25   GLUCOSE 118*  124* 90 98 143* 85  BUN 72*  70* 70* 68* 65* 37*  CREATININE 5.70*  5.82* 6.09* 6.04* 6.17* 4.45*  CALCIUM 8.6*  8.7* 8.7* 8.8* 8.3* 8.3*  PHOS 5.3* 5.4*  --  5.1*  --    Liver Function Tests: Recent Labs  Lab 07/04/20 0841 07/05/20 0329 07/07/20 1420  ALBUMIN 2.5* 2.5* 2.2*   No results for input(s): LIPASE, AMYLASE in the last 168 hours. No results for input(s): AMMONIA in the last 168 hours. CBC: Recent Labs  Lab 07/04/20 0841 07/05/20 0329 07/06/20 0026 07/07/20 0323 07/08/20 0500  WBC 7.4 7.4 7.6 7.3 6.5  HGB 7.4* 7.8* 7.3* 7.0* 6.6*  HCT 22.4* 25.3* 22.3* 22.4* 20.2*  MCV 99.1 101.6* 100.0 102.3* 100.5*  PLT 157 158 145* 133* 123*   Cardiac Enzymes: No results for input(s): CKTOTAL, CKMB, CKMBINDEX, TROPONINI in the last 168 hours. CBG: Recent Labs  Lab 07/06/20 2123 07/07/20 0656 07/07/20 1202 07/07/20 1819 07/07/20 2152  GLUCAP 153* 121* 164* 75 146*    Iron Studies: No results for input(s): IRON, TIBC, TRANSFERRIN, FERRITIN in the last 72 hours. Studies/Results: IR Fluoro Guide CV Line Right  Result Date: 07/06/2020 INDICATION: 70 year old female with acute on chronic kidney disease in need of hemodialysis. She presents for tunneled hemodialysis catheter placement. EXAM: TUNNELED CENTRAL VENOUS HEMODIALYSIS CATHETER PLACEMENT WITH ULTRASOUND AND  FLUOROSCOPIC GUIDANCE MEDICATIONS: 1 g vancomycin. The antibiotic was given in an appropriate time interval prior to skin puncture. ANESTHESIA/SEDATION: Moderate (conscious) sedation was employed during this procedure. A total of Versed 1 mg and Fentanyl 50 mcg was administered intravenously. Moderate Sedation Time: 20 minutes. The patient's level of consciousness and vital signs were monitored continuously by radiology nursing throughout the procedure under my direct supervision. FLUOROSCOPY TIME:  Fluoroscopy Time: 0 minutes 54 seconds (4 mGy). COMPLICATIONS: None immediate. PROCEDURE: Informed written consent was obtained from the patient after a discussion of the risks, benefits, and alternatives to treatment. Questions regarding the procedure were encouraged and answered. The right neck and chest were prepped with chlorhexidine in a sterile fashion, and a sterile drape was applied covering the operative field. Maximum barrier sterile technique with sterile gowns and gloves were used for the procedure. A timeout was performed prior to the initiation of the procedure. 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 The right subclavian vein was then interrogated with ultrasound using a supraclavicular approach was found to be widely patent. An image was obtained and stored for the medical record. After creating a small venotomy incision, a micropuncture kit was utilized to access the right subclavian vein under direct, real-time ultrasound guidance after the overlying soft tissues were anesthetized with 1% lidocaine with epinephrine. Ultrasound image documentation was performed. The microwire was kinked to measure appropriate catheter length. A stiff Glidewire was advanced to the level of the IVC and the micropuncture sheath was exchanged for a peel-away sheath. A palindrome tunneled hemodialysis catheter measuring 19 cm from tip to cuff was tunneled in a  retrograde fashion from the anterior chest wall to the venotomy incision. The catheter was then placed through the peel-away sheath with tips ultimately positioned within the superior aspect of the right atrium. Final catheter positioning was confirmed and documented with a spot radiographic image. The catheter aspirates and flushes normally. The catheter was flushed with appropriate volume heparin dwells. The catheter exit site was secured with a 0-Prolene retention suture. The venotomy incision was closed with an interrupted 4-0 Vicryl, Dermabond and Steri-strips. Dressings were applied. The patient tolerated the procedure well without immediate post procedural complication. IMPRESSION: Successful placement of 19 cm tip to cuff tunneled hemodialysis catheter via the right subclavian (supraclavicular approach) vein with tips terminating within the superior aspect of the right atrium. The catheter is ready for immediate use. Electronically Signed   By: Jacqulynn Cadet M.D.   On: 07/06/2020 10:39   IR US Guide Vasc Access Right  Result Date: 07/06/2020 INDICATION: 70 year old female with acute on chronic kidney disease in need of hemodialysis. She presents for tunneled hemodialysis catheter placement. EXAM: TUNNELED CENTRAL VENOUS HEMODIALYSIS CATHETER PLACEMENT  WITH ULTRASOUND AND FLUOROSCOPIC GUIDANCE MEDICATIONS: 1 g vancomycin. The antibiotic was given in an appropriate time interval prior to skin puncture. ANESTHESIA/SEDATION: Moderate (conscious) sedation was employed during this procedure. A total of Versed 1 mg and Fentanyl 50 mcg was administered intravenously. Moderate Sedation Time: 20 minutes. The patient's level of consciousness and vital signs were monitored continuously by radiology nursing throughout the procedure under my direct supervision. FLUOROSCOPY TIME:  Fluoroscopy Time: 0 minutes 54 seconds (4 mGy). COMPLICATIONS: None immediate. PROCEDURE: Informed written consent was obtained from the  patient after a discussion of the risks, benefits, and alternatives to treatment. Questions regarding the procedure were encouraged and answered. The right neck and chest were prepped with chlorhexidine in a sterile fashion, and a sterile drape was applied covering the operative field. Maximum barrier sterile technique with sterile gowns and gloves were used for the procedure. A timeout was performed prior to the initiation of the procedure. 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 The right subclavian vein was then interrogated with ultrasound using a supraclavicular approach was found to be widely patent. An image was obtained and stored for the medical record. After creating a small venotomy incision, a micropuncture kit was utilized to access the right subclavian vein under direct, real-time ultrasound guidance after the overlying soft tissues were anesthetized with 1% lidocaine with epinephrine. Ultrasound image documentation was performed. The microwire was kinked to measure appropriate catheter length. A stiff Glidewire was advanced to the level of the IVC and the micropuncture sheath was exchanged for a peel-away sheath. A palindrome tunneled hemodialysis catheter measuring 19 cm from tip to cuff was tunneled in a retrograde fashion from the anterior chest wall to the venotomy incision. The catheter was then placed through the peel-away sheath with tips ultimately positioned within the superior aspect of the right atrium. Final catheter positioning was confirmed and documented with a spot radiographic image. The catheter aspirates and flushes normally. The catheter was flushed with appropriate volume heparin dwells. The catheter exit site was secured with a 0-Prolene retention suture. The venotomy incision was closed with an interrupted 4-0 Vicryl, Dermabond and Steri-strips. Dressings were applied. The patient tolerated the procedure well without  immediate post procedural complication. IMPRESSION: Successful placement of 19 cm tip to cuff tunneled hemodialysis catheter via the right subclavian (supraclavicular approach) vein with tips terminating within the superior aspect of the right atrium. The catheter is ready for immediate use. Electronically Signed   By: Jacqulynn Cadet M.D.   On: 07/06/2020 10:39   VAS [place] UPPER EXT VEIN MAPPING (PRE-OP AVF)  Result Date: 07/06/2020 UPPER EXTREMITY VEIN MAPPING  Indications: Pre-access. Comparison Study: No prior exams Performing Technologist: Rogelia Rohrer  Examination Guidelines: A complete evaluation includes B-mode imaging, spectral Doppler, color Doppler, and power Doppler as needed of all accessible portions of each vessel. Bilateral testing is considered an integral part of a complete examination. Limited examinations for reoccurring indications may be performed as noted. +-----------------+-------------+----------+---------+ Right Cephalic   Diameter (cm)Depth (cm)Findings  +-----------------+-------------+----------+---------+ Prox upper arm       0.35        0.87             +-----------------+-------------+----------+---------+ Mid upper arm        0.37        0.49             +-----------------+-------------+----------+---------+ Dist upper arm       0.45  0.52             +-----------------+-------------+----------+---------+ Antecubital fossa    0.55        0.23   branching +-----------------+-------------+----------+---------+ Prox forearm         0.36        0.56             +-----------------+-------------+----------+---------+ Mid forearm          0.33        0.51             +-----------------+-------------+----------+---------+ Dist forearm         0.32        0.33             +-----------------+-------------+----------+---------+ Wrist                0.28        0.29             +-----------------+-------------+----------+---------+  +-----------------+-------------+----------+---------+ Right Basilic    Diameter (cm)Depth (cm)Findings  +-----------------+-------------+----------+---------+ Shoulder             0.45                         +-----------------+-------------+----------+---------+ Prox upper arm       0.42                         +-----------------+-------------+----------+---------+ Mid upper arm        0.40                         +-----------------+-------------+----------+---------+ Dist upper arm       0.50               branching +-----------------+-------------+----------+---------+ Antecubital fossa    0.57               branching +-----------------+-------------+----------+---------+ Prox forearm         0.37                         +-----------------+-------------+----------+---------+ Mid forearm          0.36                         +-----------------+-------------+----------+---------+ Distal forearm       0.28               branching +-----------------+-------------+----------+---------+ Wrist                0.20                         +-----------------+-------------+----------+---------+ +-----------------+-------------+----------+---------------------+ Left Cephalic    Diameter (cm)Depth (cm)      Findings        +-----------------+-------------+----------+---------------------+ Prox upper arm       0.23        0.50                         +-----------------+-------------+----------+---------------------+ Mid upper arm        0.17        0.58                         +-----------------+-------------+----------+---------------------+ Dist upper arm       0.40  0.39         Thrombus        +-----------------+-------------+----------+---------------------+ Antecubital fossa    0.45        0.30         Thrombus        +-----------------+-------------+----------+---------------------+ Prox forearm         0.34        0.50          branching       +-----------------+-------------+----------+---------------------+ Mid forearm          0.34        0.73                         +-----------------+-------------+----------+---------------------+ Dist forearm         0.37        0.47                         +-----------------+-------------+----------+---------------------+ Wrist                                   not visualized and IV +-----------------+-------------+----------+---------------------+ +-----------------+-------------+----------+---------+ Left Basilic     Diameter (cm)Depth (cm)Findings  +-----------------+-------------+----------+---------+ Shoulder             0.93                         +-----------------+-------------+----------+---------+ Prox upper arm       0.37                         +-----------------+-------------+----------+---------+ Mid upper arm        0.35               branching +-----------------+-------------+----------+---------+ Dist upper arm       0.32                         +-----------------+-------------+----------+---------+ Antecubital fossa    0.36               branching +-----------------+-------------+----------+---------+ Prox forearm         0.23                         +-----------------+-------------+----------+---------+ Mid forearm          0.19                         +-----------------+-------------+----------+---------+ Distal forearm       0.15                         +-----------------+-------------+----------+---------+ Wrist                0.12                         +-----------------+-------------+----------+---------+ Summary:  Left: SVT of cephalic vein in area of distal upper arm and AC fossa       - previous site of IV. *See table(s) above for measurements and observations.  Diagnosing physician: Harold Barban MD Electronically signed by Harold Barban MD on 07/06/2020 at 9:36:31 PM.    Final      Medications: Infusions: . heparin 1,000 Units/hr (07/07/20  1427)    Scheduled Medications: . amiodarone  200 mg Oral Daily  . aspirin EC  81 mg Oral Daily  . atorvastatin  40 mg Oral q1800  . carvedilol  12.5 mg Oral BID WC  . Chlorhexidine Gluconate Cloth  6 each Topical Daily  . Chlorhexidine Gluconate Cloth  6 each Topical Q0600  . folic acid  4,656 mcg Oral Daily  . sodium bicarbonate  1,300 mg Oral TID    have reviewed scheduled and prn medications.  Physical Exam: General:NAD, comfortable  Heart:RRR, s1s2 nl Lungs:clear b/l, no crackle Abdomen:soft, Non-tender, non-distended Extremities:No edema, +ishemic toes.  Neurology: Alert awake and following commands, no asterixis  But tremor Dialysis access:  RIJ TDC c/d/i  Justin Mend 07/08/2020,7:48 AM  LOS: 7 days

## 2020-07-09 DIAGNOSIS — N179 Acute kidney failure, unspecified: Secondary | ICD-10-CM | POA: Diagnosis not present

## 2020-07-09 DIAGNOSIS — I714 Abdominal aortic aneurysm, without rupture: Secondary | ICD-10-CM | POA: Diagnosis not present

## 2020-07-09 LAB — RENAL FUNCTION PANEL
Albumin: 2.2 g/dL — ABNORMAL LOW (ref 3.5–5.0)
Anion gap: 11 (ref 5–15)
BUN: 40 mg/dL — ABNORMAL HIGH (ref 8–23)
CO2: 23 mmol/L (ref 22–32)
Calcium: 8.4 mg/dL — ABNORMAL LOW (ref 8.9–10.3)
Chloride: 104 mmol/L (ref 98–111)
Creatinine, Ser: 4.88 mg/dL — ABNORMAL HIGH (ref 0.44–1.00)
GFR, Estimated: 9 mL/min — ABNORMAL LOW (ref >60)
Glucose, Bld: 103 mg/dL — ABNORMAL HIGH (ref 70–99)
Phosphorus: 4.2 mg/dL (ref 2.5–4.6)
Potassium: 4.1 mmol/L (ref 3.5–5.1)
Sodium: 138 mmol/L (ref 135–145)

## 2020-07-09 LAB — APTT: aPTT: 69 seconds — ABNORMAL HIGH (ref 24–36)

## 2020-07-09 LAB — TYPE AND SCREEN
ABO/RH(D): O POS
Antibody Screen: NEGATIVE
Unit division: 0

## 2020-07-09 LAB — BPAM RBC
Blood Product Expiration Date: 202202262359
ISSUE DATE / TIME: 202201281423
Unit Type and Rh: 5100

## 2020-07-09 LAB — CBC
HCT: 27.3 % — ABNORMAL LOW (ref 36.0–46.0)
Hemoglobin: 8.4 g/dL — ABNORMAL LOW (ref 12.0–15.0)
MCH: 31 pg (ref 26.0–34.0)
MCHC: 30.8 g/dL (ref 30.0–36.0)
MCV: 100.7 fL — ABNORMAL HIGH (ref 80.0–100.0)
Platelets: 122 10*3/uL — ABNORMAL LOW (ref 150–400)
RBC: 2.71 MIL/uL — ABNORMAL LOW (ref 3.87–5.11)
RDW: 16.6 % — ABNORMAL HIGH (ref 11.5–15.5)
WBC: 7.2 10*3/uL (ref 4.0–10.5)
nRBC: 0 % (ref 0.0–0.2)

## 2020-07-09 LAB — GLUCOSE, CAPILLARY
Glucose-Capillary: 99 mg/dL (ref 70–99)
Glucose-Capillary: 123 mg/dL — ABNORMAL HIGH (ref 70–99)
Glucose-Capillary: 125 mg/dL — ABNORMAL HIGH (ref 70–99)
Glucose-Capillary: 115 mg/dL — ABNORMAL HIGH (ref 70–99)

## 2020-07-09 LAB — HEPARIN LEVEL (UNFRACTIONATED): Heparin Unfractionated: 0.73 IU/mL — ABNORMAL HIGH (ref 0.30–0.70)

## 2020-07-09 MED ORDER — DARBEPOETIN ALFA 60 MCG/0.3ML IJ SOSY: 60.0000 ug | PREFILLED_SYRINGE | INTRAMUSCULAR | Status: DC

## 2020-07-09 NOTE — Progress Notes (Signed)
ANTICOAGULATION CONSULT NOTE - Follow Up Consult  Pharmacy Consult for heparin Indication: atrial fibrillation  Allergies  Allergen Reactions  . Penicillins Other (See Comments)    Childhood reaction.    Patient Measurements: Height: 5\' 3"  (160 cm) Weight: 69.9 kg (154 lb 1.6 oz) IBW/kg (Calculated) : 52.4  Vital Signs: Temp: 97.7 F (36.5 C) (01/29 0617) Temp Source: Oral (01/29 0617) BP: 150/64 (01/29 0617) Pulse Rate: 60 (01/29 0617)  Labs: Recent Labs    07/07/20 0323 07/07/20 1420 07/08/20 0500 07/09/20 0432  HGB 7.0*  --  6.6* 8.4*  HCT 22.4*  --  20.2* 27.3*  PLT 133*  --  123* 122*  APTT 92*  --  79* 69*  HEPARINUNFRC 1.04*  --  0.95* 0.73*  CREATININE  --  6.17* 4.45* 4.88*    Estimated Creatinine Clearance: 10.5 mL/min (A) (by C-G formula based on SCr of 4.88 mg/dL (H)).  Assessment: 70 yo female went to PCP for N/V and found to have "abnormal labs" and sent to ED, found to be w/ AKI (baseline Scr 1.2, now >6), to transition from Eliquis for Afib to UFH; last dose of Eliquis taken 1/20 10a.  New HD, s/p arteriogram 1/28  PTT within range, Heparin level trending down  Goal of Therapy:  Heparin level 0.3-0.7 units/ml  aPTT 66-102 seconds Monitor platelets by anticoagulation protocol: Yes   Plan:  Continue heparin at 1000 units / hr Follow up AM labs  Thank you Anette Guarneri, PharmD 07/09/2020 7:55 AM

## 2020-07-09 NOTE — Plan of Care (Signed)

## 2020-07-09 NOTE — Progress Notes (Signed)
   ASSESSMENT & PLAN:  Kathryn Hancock is a 70 y.o. 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  Angiogram yesterday revealed bilateral SFA occlusions.  She will need staged femoral to above-knee popliteal artery bypass grafting for both lower extremities.  We will plan to do this if wounds progress given her multiple active medical problems.  SUBJECTIVE:  No complaints.  OBJECTIVE:  BP (!) 150/64 (BP Location: Left Arm)   Pulse 60   Temp 97.7 F (36.5 C) (Oral)   Resp 18   Ht 5\' 3"  (1.6 m)   Wt 69.9 kg   SpO2 94%   BMI 27.30 kg/m   Intake/Output Summary (Last 24 hours) at 07/09/2020 0959 Last data filed at 07/09/2020 0601 Gross per 24 hour  Intake 1005.69 ml  Output --  Net 1005.69 ml    Left groin soft Unchanged gangrenous toes bilateral lower extremities  CBC Latest Ref Rng & Units 07/09/2020 07/08/2020 07/07/2020  WBC 4.0 - 10.5 K/uL 7.2 6.5 7.3  Hemoglobin 12.0 - 15.0 g/dL 8.4(L) 6.6(LL) 7.0(L)  Hematocrit 36.0 - 46.0 % 27.3(L) 20.2(L) 22.4(L)  Platelets 150 - 400 K/uL 122(L) 123(L) 133(L)     CMP Latest Ref Rng & Units 07/09/2020 07/08/2020 07/07/2020  Glucose 70 - 99 mg/dL 103(H) 85 143(H)  BUN 8 - 23 mg/dL 40(H) 37(H) 65(H)  Creatinine 0.44 - 1.00 mg/dL 4.88(H) 4.45(H) 6.17(H)  Sodium 135 - 145 mmol/L 138 140 137  Potassium 3.5 - 5.1 mmol/L 4.1 3.7 4.1  Chloride 98 - 111 mmol/L 104 103 103  CO2 22 - 32 mmol/L 23 25 23   Calcium 8.9 - 10.3 mg/dL 8.4(L) 8.3(L) 8.3(L)  Total Protein 6.5 - 8.1 g/dL - - -  Total Bilirubin 0.3 - 1.2 mg/dL - - -  Alkaline Phos 38 - 126 U/L - - -  AST 15 - 41 U/L - - -  ALT 0 - 44 U/L - - -    Estimated Creatinine Clearance: 10.5 mL/min (A) (by C-G formula based on SCr of 4.88 mg/dL (H)).  Kathryn Hancock. Stanford Breed, MD [facility] Phone Number: [phone] 07/09/2020 9:59 AM

## 2020-07-09 NOTE — Progress Notes (Signed)
Aurther Loft, RN called and notified pt's HD treatment has been moved to 07/10/2020.

## 2020-07-09 NOTE — Plan of Care (Signed)

## 2020-07-09 NOTE — Progress Notes (Signed)
[facility] NEPHROLOGY PROGRESS NOTE  Assessment/ Plan: Pt is a 70 y.o. yo female  with history of hypertension, diabetes, tobacco use, left atrial myxoma surgery in 05/2020 at [facility], AAA who developed acute kidney injury thought to be due to atheroembolic disease. The creatinine level was 4.1 during outpatient nephrology visit on 1/5. Sent by her PCP for worsening renal function.   #Acute kidney injury on CKD: Recent progressive kidney disease after cardiac surgery thought to be due to atheroembolic disease. The acute worsening probably due to hemodynamic change related with hypotension and decreased oral intake.  UA bland and doppler kidney US without RAS. CT scan ruled out obstruction.   Renal function continued to worsen despite supportive care.  We had a long discussion re: options and she wishes to proceed with dialysis.  Both parents did it (deceased Sep 10, 1992 and 23-Aug-2022).  Suspect she will be dialysis dependent now.  [place] placed by IR 1/27, HD #1 1/27.  Vein map ordered.  Discussed AV access with VVS - will assess, appreciated.  Pt aware.    #Metabolic acidosis: cont po bicarb 1300 TID.  d/cd given HD.   #anemia:  Hb improved 8.4 after transfusion; start ESA with HD.   #Hyperkalemia: Improved after medical treatment.    # Hypertension: Blood pressure ok.  Discontinued amlodipine. Continue beta-blocker because of A. fib.    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  # A fib:  On heparin gtt for now.   #thrombocytopenia:  Plt in 120s, no bleeding noted. Monitor for now.   Subjective: Seen and examined.  Chart reviewed.  No UOP. She feels ok but is tired.   Hd today.  LE angiogram yesterday - BL SFA occlusions.   Objective Vital signs in last 24 hours: Vitals:   07/08/20 1615 07/08/20 1723 07/08/20 2139-09-11 07/09/20 0617  BP: (!) 131/56 133/63 (!) 144/57 (!) 150/64   Pulse: 64 64 62 60  Resp: 16 18 18 18   Temp: 99.6 F (37.6 C) 98.6 F (37 C) 98.5 F (36.9 C) 97.7 F (36.5 C)  TempSrc:  Oral Oral Oral  SpO2: 93% 95% 94% 94%  Weight:      Height:       Weight change:   Intake/Output Summary (Last 24 hours) at 07/09/2020 09/11/1206 Last data filed at 07/09/2020 1159 Gross per 24 hour  Intake 1005.69 ml  Output 600 ml  Net 405.69 ml       Labs: Basic Metabolic Panel: Recent Labs  Lab 07/05/20 0329 07/06/20 0026 07/07/20 1420 07/08/20 0500 07/09/20 0432  NA 137   < > 137 140 138  K 4.2   < > 4.1 3.7 4.1  CL 101   < > 103 103 104  CO2 22   < > 23 25 23   GLUCOSE 90   < > 143* 85 103*  BUN 70*   < > 65* 37* 40*  CREATININE 6.09*   < > 6.17* 4.45* 4.88*  CALCIUM 8.7*   < > 8.3* 8.3* 8.4*  PHOS 5.4*  --  5.1*  --  4.2   < > = values in this interval not displayed.   Liver Function Tests: Recent Labs  Lab 07/05/20 0329 07/07/20 1420 07/09/20 0432  ALBUMIN 2.5* 2.2* 2.2*   No results for input(s): LIPASE, AMYLASE in the last 168 hours. No results for input(s): AMMONIA in the last 168 hours.  CBC: Recent Labs  Lab 07/05/20 0329 07/06/20 0026 07/07/20 0323 07/08/20 0500 07/09/20 0432  WBC 7.4 7.6 7.3 6.5 7.2  HGB 7.8* 7.3* 7.0* 6.6* 8.4*  HCT 25.3* 22.3* 22.4* 20.2* 27.3*  MCV 101.6* 100.0 102.3* 100.5* 100.7*  PLT 158 145* 133* 123* 122*   Cardiac Enzymes: No results for input(s): CKTOTAL, CKMB, CKMBINDEX, TROPONINI in the last 168 hours. CBG: Recent Labs  Lab 07/08/20 1225 07/08/20 1611 07/08/20 2139 07/09/20 0649 07/09/20 1130  GLUCAP 86 125* 104* 99 123*    Iron Studies: No results for input(s): IRON, TIBC, TRANSFERRIN, FERRITIN in the last 72 hours. Studies/Results: PERIPHERAL VASCULAR CATHETERIZATION  Result Date: 07/08/2020 PATIENT: Kathryn Hancock      MRN: [id number] DOB: January 29, 1951    DATE OF PROCEDURE: 07/08/2020 INDICATIONS:  Elena Davia is a 70 y.o. female who presented with ischemic changes to both feet.  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 Therefore we proceeded with arteriography. PROCEDURE:  1. Conscious sedation 2. Ultrasound-guided access to the left common femoral artery 3. Aortogram with bilateral iliac arteriogram and bilateral lower extremity runoff 4. Mynx closure of left common femoral artery SURGEON: Judeth Cornfield. Scot Dock, MD, FACS ANESTHESIA: Local with sedation EBL: Minimal TECHNIQUE: The patient was brought to the peripheral vascular lab and was sedated. The period of conscious sedation was 28 minutes.  During that time period, I was present face-to-face 100% of the time.  The patient was administered 1 mg of Versed and 50 mcg of fentanyl. The patient's heart rate, blood pressure, and oxygen saturation were monitored by the nurse continuously during the procedure. Both groins were prepped and draped in the usual sterile fashion.  Under ultrasound guidance, after the skin was anesthetized, I cannulated the left common femoral artery with a micropuncture needle and a micropuncture sheath was introduced over a wire.  This was exchanged for a 5 [place] sheath over a Bentson wire.  By ultrasound the femoral artery was patent. A real-time image was obtained and sent to the server. A pigtail catheter was positioned at the L1 vertebral body and flush aortogram obtained. The catheter was in position above the aortic bifurcation and bilateral lower extremity runoff films were obtained. The catheter was then removed over a wire. The sheath was flushed. The minx closure device was deployed without difficulty and there was good hemostasis. FINDINGS: 1. There are 2 renal arteries on the right and single renal artery on the left. The patient has a known pararenal aneurysm. 2. On the right side, the common iliac, external iliac, and hypogastric arteries are patent. The common femoral and deep femoral artery are patent. The superficial femoral artery is  occluded at its origin with reconstitution of the above-knee popliteal artery and two-vessel runoff via the peroneal and posterior tibial arteries. The anterior tibial artery on the right is occluded. 3. On the left side the common, internal, and external iliac arteries are patent. The common femoral and deep femoral artery are patent. The superficial femoral artery is occluded at its origin. There is reconstitution of the above-knee popliteal artery with two-vessel runoff via the posterior tibial and peroneal arteries. The anterior tibial artery on the left is occluded. CLINICAL NOTE: This patient has bilateral superficial femoral artery occlusions. Currently her toes are stable. If she developed worsening wounds on her feet her best chance for limb salvage would be staged bilateral femoral to above-knee popliteal artery bypass graft. Currently given that she is  just started dialysis with multiple medical comorbidities we will not plan revascularization unless the wounds progress. Deitra Mayo, MD, FACS [facility] DATE OF DICTATION:   07/08/2020   Medications: Infusions: . sodium chloride    . sodium chloride    . sodium chloride    . heparin 1,000 Units/hr (07/08/20 1830)    Scheduled Medications: . amiodarone  200 mg Oral Daily  . aspirin EC  81 mg Oral Daily  . atorvastatin  40 mg Oral q1800  . carvedilol  12.5 mg Oral BID WC  . Chlorhexidine Gluconate Cloth  6 each Topical Daily  . Chlorhexidine Gluconate Cloth  6 each Topical Q0600  . folic acid  1,771 mcg Oral Daily  . sodium bicarbonate  1,300 mg Oral TID  . sodium chloride flush  3 mL Intravenous Q12H    have reviewed scheduled and prn medications.  Physical Exam: General:NAD, comfortable  Heart:RRR, s1s2 nl Lungs:clear b/l, no crackle Abdomen:soft, Non-tender, non-distended Extremities:No edema, +ishemic toes.  Neurology: Alert awake and following commands, no asterixis  But  tremor Dialysis access:  RIJ TDC c/d/i  Justin Mend 07/09/2020,12:08 PM  LOS: 8 days

## 2020-07-09 NOTE — Progress Notes (Signed)
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  During her prior hospital stay at [facility] patient had worsening renal function and lower extremity ischemic change and postoperative A. fib. 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 Was instructed to repeat and the repeat on showed worsening and was advised to come to the ER. Patient states over the last 2 weeks patient has been worsening nausea vomiting unable to keep anything but denies any abdominal pain or diarrhea.  Subjective / 24h Interval events: Denies any chest pain, no shortness of breath.  No abdominal pain, no nausea or vomiting  Assessment & Plan: Principal Problem Acute kidney injury on chronic kidney disease stage IIIb/metabolic acidosis -Possibly in the setting of atheroembolic disease, kidney function is worsening after recent surgery for left atrial myxoma.  Nephrology consulted, now started on dialysis, underwent catheter placement on 1/26. -Vein mapping per nephrology.  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  If she is worse she would need staged bilateral femoral to above-knee popliteal artery bypass graft.  Currently stable  Nausea/vomiting -Could be secondary from uremia, improving.  Benign abdominal exam.  CT renal studies shows no obstruction/hydronephrosis/pyelonephritis. -Continue Zofran as needed for nausea and vomiting.  Seems resolved  Hyperkalemia -In the  setting of worsening kidney function.   -Resolved after Lokelma.  Managed with dialysis  Paroxysmal A. Fib -Rate control -Continue Coreg and amiodarone.  Hold Eliquis in the setting of worsening kidney function.  Continue heparin as per pharmacy.  Once no further procedures plans could probably be transitioned to Coumadin given new onset dialysis needs  Hypertension -Stable.  Continue amlodipine and Coreg  Type 2 diabetes mellitus -Well controlled.  A1c 5.6% SSI discontinued due to hypoglycemia.  Anemia of chronic disease -Macrocytosis.  H&H trended down and now 6.6.  Transfuse a unit of packed red blood cells.  No active bleeding  Recent CABG for left atrial myxoma at [facility]: -In December 2021.  Patient denies an chest pain or shortness of breath.  Continue to monitor -Continue aspirin, statin  History of abdominal aortic aneurysm  -Reviewed CT renal study showed unchanged from prior exam back to January 15/2021.  Tobacco abuse -smokes 1 pack of cigarettes per day-tells me that she is working on cessation.  Cuts back from 2 packs to 1 pack.   Scheduled Meds: . amiodarone  200 mg Oral Daily  . aspirin EC  81 mg Oral Daily  . atorvastatin  40 mg Oral q1800  . carvedilol  12.5 mg Oral BID WC  . Chlorhexidine Gluconate Cloth  6 each Topical Daily  . Chlorhexidine Gluconate Cloth  6 each Topical Q0600  . folic acid  2,409 mcg Oral Daily  . sodium bicarbonate  1,300 mg Oral TID  . sodium chloride flush  3 mL Intravenous Q12H   Continuous Infusions: . sodium chloride    . sodium chloride    . sodium chloride    . heparin 1,000 Units/hr (07/08/20 1830)   PRN Meds:.sodium chloride, sodium  chloride, sodium chloride, acetaminophen **OR** acetaminophen, alteplase, heparin, hydrALAZINE, labetalol, lidocaine (PF), lidocaine-prilocaine, ondansetron (ZOFRAN) IV, oxyCODONE, pentafluoroprop-tetrafluoroeth, sodium chloride flush  Diet Orders (From admission, onward)     Start     Ordered   07/08/20 1236  Diet Heart Room service appropriate? Yes; Fluid consistency: Thin  Diet effective now       Question Answer Comment  Room service appropriate? Yes   Fluid consistency: Thin      07/08/20 1235          DVT prophylaxis:      Code Status: Full Code  Family Communication: none   Status is: Inpatient  Remains inpatient appropriate because:Inpatient level of care appropriate due to severity of illness   Dispo: The patient is from: Home              Anticipated d/c is to: Home              Anticipated d/c date is: 2 days              Patient currently is not medically stable to d/c.   Difficult to place patient No  Level of care: Telemetry Medical  Consultants:  Nephrology  Vascular surgery   Procedures:  Arteriogram   Microbiology  None   Antimicrobials: None     Objective: Vitals:   07/08/20 1615 07/08/20 1723 07/08/20 2141 07/09/20 0617  BP: (!) 131/56 133/63 (!) 144/57 (!) 150/64  Pulse: 64 64 62 60  Resp: 16 18 18 18   Temp: 99.6 F (37.6 C) 98.6 F (37 C) 98.5 F (36.9 C) 97.7 F (36.5 C)  TempSrc:  Oral Oral Oral  SpO2: 93% 95% 94% 94%  Weight:      Height:        Intake/Output Summary (Last 24 hours) at 07/09/2020 0947 Last data filed at 07/09/2020 0601 Gross per 24 hour  Intake 1005.69 ml  Output --  Net 1005.69 ml   Filed Weights   07/06/20 0422 07/07/20 0700 07/08/20 0500  Weight: 69.6 kg 69.9 kg 69.9 kg    Examination:  Constitutional: No distress Eyes: No icterus ENMT: mmm Neck: normal, supple Respiratory: Clear bilaterally, no wheezing, no crackles Cardiovascular: Regular rate and rhythm, no murmurs Abdomen: Soft, nontender, nondistended, bowel sounds positive Musculoskeletal: no clubbing / cyanosis.  Skin: No rashes seen Neurologic: Nonfocal, equal strength  Data Reviewed: I have independently reviewed following labs and imaging studies   CBC: Recent Labs  Lab 07/05/20 0329  07/06/20 0026 07/07/20 0323 07/08/20 0500 07/09/20 0432  WBC 7.4 7.6 7.3 6.5 7.2  HGB 7.8* 7.3* 7.0* 6.6* 8.4*  HCT 25.3* 22.3* 22.4* 20.2* 27.3*  MCV 101.6* 100.0 102.3* 100.5* 100.7*  PLT 158 145* 133* 123* 322*   Basic Metabolic Panel: Recent Labs  Lab 07/03/20 0320 07/04/20 0841 07/05/20 0329 07/06/20 0026 07/07/20 1420 07/08/20 0500 07/09/20 0432  NA 138 137  137 137 138 137 140 138  K 4.9 4.4  4.3 4.2 4.5 4.1 3.7 4.1  CL 110 102  101 101 102 103 103 104  CO2 14* 21*  22 22 24 23 25 23   GLUCOSE 69* 118*  124* 90 98 143* 85 103*  BUN 79* 72*  70* 70* 68* 65* 37* 40*  CREATININE 5.92* 5.70*  5.82* 6.09* 6.04* 6.17* 4.45* 4.88*  CALCIUM 8.4* 8.6*  8.7* 8.7* 8.8* 8.3* 8.3* 8.4*  PHOS 6.2* 5.3* 5.4*  --  5.1*  --  4.2   Liver Function Tests:  Recent Labs  Lab 07/03/20 0320 07/04/20 0841 07/05/20 0329 07/07/20 1420 07/09/20 0432  ALBUMIN 2.5* 2.5* 2.5* 2.2* 2.2*   Coagulation Profile: No results for input(s): INR, PROTIME in the last 168 hours. HbA1C: No results for input(s): HGBA1C in the last 72 hours. CBG: Recent Labs  Lab 07/08/20 1026 07/08/20 1225 07/08/20 1611 07/08/20 2139 07/09/20 0649  GLUCAP 90 86 125* 104* 99    Recent Results (from the past 240 hour(s))  SARS CORONAVIRUS 2 (TAT 6-24 HRS) Nasopharyngeal Nasopharyngeal Swab     Status: None   Collection Time: 07/01/20  5:46 AM   Specimen: Nasopharyngeal Swab  Result Value Ref Range Status   SARS Coronavirus 2 NEGATIVE NEGATIVE Final    Comment: (NOTE) SARS-CoV-2 target nucleic acids are NOT DETECTED.  The SARS-CoV-2 RNA is generally detectable in upper and lower respiratory specimens during the acute phase of infection. Negative results do not preclude SARS-CoV-2 infection, do not rule out co-infections with other pathogens, and should not be used as the sole basis for treatment or other patient management decisions. Negative results must be combined with clinical  observations, patient history, and epidemiological information. The expected result is Negative.  Fact Sheet for Patients: SugarRoll.be  Fact Sheet for Healthcare Providers: https://www.woods-mathews.com/  This test is not yet approved or cleared by the [place] FDA and  has been authorized for detection and/or diagnosis of SARS-CoV-2 by FDA under an Emergency Use Authorization (EUA). This EUA will remain  in effect (meaning this test can be used) for the duration of the COVID-19 declaration under Se ction 564(b)(1) of the Act, 21 U.S.C. section 360bbb-3(b)(1), unless the authorization is terminated or revoked sooner.  Performed at [facility], [place] [address], [place], [place] [address]      Radiology Studies: No results found. Marzetta Board, MD, PhD [facility]  Between 7 am - 7 pm I am available, please contact me via Amion or Securechat  Between 7 pm - 7 am I am not available, please contact night coverage MD/APP via Amion

## 2020-07-10 DIAGNOSIS — I714 Abdominal aortic aneurysm, without rupture: Secondary | ICD-10-CM | POA: Diagnosis not present

## 2020-07-10 DIAGNOSIS — N179 Acute kidney failure, unspecified: Secondary | ICD-10-CM | POA: Diagnosis not present

## 2020-07-10 LAB — CBC
HCT: 28.1 % — ABNORMAL LOW (ref 36.0–46.0)
Hemoglobin: 8.7 g/dL — ABNORMAL LOW (ref 12.0–15.0)
MCH: 31.4 pg (ref 26.0–34.0)
MCHC: 31 g/dL (ref 30.0–36.0)
MCV: 101.4 fL — ABNORMAL HIGH (ref 80.0–100.0)
Platelets: 124 10*3/uL — ABNORMAL LOW (ref 150–400)
RBC: 2.77 MIL/uL — ABNORMAL LOW (ref 3.87–5.11)
RDW: 16.1 % — ABNORMAL HIGH (ref 11.5–15.5)
WBC: 7.2 10*3/uL (ref 4.0–10.5)
nRBC: 0 % (ref 0.0–0.2)

## 2020-07-10 LAB — RENAL FUNCTION PANEL
Albumin: 2.1 g/dL — ABNORMAL LOW (ref 3.5–5.0)
Anion gap: 11 (ref 5–15)
BUN: 38 mg/dL — ABNORMAL HIGH (ref 8–23)
CO2: 22 mmol/L (ref 22–32)
Calcium: 8.6 mg/dL — ABNORMAL LOW (ref 8.9–10.3)
Chloride: 106 mmol/L (ref 98–111)
Creatinine, Ser: 5 mg/dL — ABNORMAL HIGH (ref 0.44–1.00)
GFR, Estimated: 9 mL/min — ABNORMAL LOW (ref >60)
Glucose, Bld: 86 mg/dL (ref 70–99)
Phosphorus: 4.5 mg/dL (ref 2.5–4.6)
Potassium: 4.1 mmol/L (ref 3.5–5.1)
Sodium: 139 mmol/L (ref 135–145)

## 2020-07-10 LAB — HEPARIN LEVEL (UNFRACTIONATED): Heparin Unfractionated: 0.55 IU/mL (ref 0.30–0.70)

## 2020-07-10 LAB — APTT: aPTT: 83 seconds — ABNORMAL HIGH (ref 24–36)

## 2020-07-10 LAB — GLUCOSE, CAPILLARY
Glucose-Capillary: 89 mg/dL (ref 70–99)
Glucose-Capillary: 81 mg/dL (ref 70–99)
Glucose-Capillary: 141 mg/dL — ABNORMAL HIGH (ref 70–99)

## 2020-07-10 MED ORDER — VANCOMYCIN HCL IN DEXTROSE 1-5 GM/200ML-% IV SOLN
1000.0000 mg | INTRAVENOUS | Status: AC
  Administered 2020-07-11: 1000 mg via INTRAVENOUS
  Filled 2020-07-10: qty 200

## 2020-07-10 NOTE — Progress Notes (Signed)
[facility] NEPHROLOGY PROGRESS NOTE  Assessment/ Plan: Pt is a 70 y.o. yo female  with history of hypertension, diabetes, tobacco use, left atrial myxoma surgery in 05/2020 at [facility], AAA who developed acute kidney injury thought to be due to atheroembolic disease. The creatinine level was 4.1 during outpatient nephrology visit on 1/5. Sent by her PCP for worsening renal function.   #Acute kidney injury on CKD: Recent progressive kidney disease after cardiac surgery thought to be due to atheroembolic disease. The acute worsening probably due to hemodynamic change related with hypotension and decreased oral intake.  UA bland and doppler kidney US without RAS. CT scan ruled out obstruction.   Renal function continued to worsen despite supportive care.  We had a long discussion re: options and she wishes to proceed with dialysis.  Both parents did it (deceased Sep 16, 1992 and 2022-08-29).  Suspect she will be dialysis dependent now.  [place] placed by IR 1/27, HD #1 1/27. HD #2 1/30.  Vein map ordered.  Discussed AV access with VVS - will assess for per access this admission, appreciated - planned tomorrow.  Pt aware.    #anemia:  Hb improved 8.7 after transfusion; started ESA with HD.   #Hyperkalemia: Improved after medical treatment.    # Hypertension: Blood pressure ok.  Discontinued amlodipine. Continue beta-blocker because of A. fib.    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  # A fib:  On heparin gtt for now.   #thrombocytopenia:  Plt in 120s, no bleeding noted. Monitor for now.   Subjective: Seen and examined.  Chart reviewed.  UOP 1150. She feels ok but is tired.  Mod dysgeusia still.   Objective Vital signs in last 24 hours: Vitals:   07/09/20 09-16-2012 07/10/20 0452 07/10/20 0500 07/10/20 0900  BP: (!) 148/68 (!) 151/58  (!) 147/60  Pulse: 65 (!) 56  62  Resp:    18  Temp:  98.3 F (36.8 C) (!) 97.5 F (36.4 C)  98 F (36.7 C)  TempSrc: Oral Oral  Oral  SpO2: 95% 95%  96%  Weight: 72.4 kg  72.2 kg   Height:       Weight change:   Intake/Output Summary (Last 24 hours) at 07/10/2020 1053 Last data filed at 07/10/2020 0900 Gross per 24 hour  Intake 993 ml  Output 1400 ml  Net -407 ml       Labs: Basic Metabolic Panel: Recent Labs  Lab 07/07/20 1420 07/08/20 0500 07/09/20 0432 07/10/20 0417  NA 137 140 138 139  K 4.1 3.7 4.1 4.1  CL 103 103 104 106  CO2 23 25 23 22   GLUCOSE 143* 85 103* 86  BUN 65* 37* 40* 38*  CREATININE 6.17* 4.45* 4.88* 5.00*  CALCIUM 8.3* 8.3* 8.4* 8.6*  PHOS 5.1*  --  4.2 4.5   Liver Function Tests: Recent Labs  Lab 07/07/20 1420 07/09/20 0432 07/10/20 0417  ALBUMIN 2.2* 2.2* 2.1*   No results for input(s): LIPASE, AMYLASE in the last 168 hours. No results for input(s): AMMONIA in the last 168 hours. CBC: Recent Labs  Lab 07/06/20 0026 07/07/20 0323 07/08/20 0500 07/09/20 0432 07/10/20 0417  WBC 7.6 7.3 6.5 7.2 7.2  HGB 7.3* 7.0* 6.6* 8.4* 8.7*  HCT 22.3* 22.4* 20.2* 27.3* 28.1*  MCV 100.0 102.3* 100.5* 100.7* 101.4*  PLT 145* 133* 123* 122* 124*   Cardiac Enzymes: No results for  input(s): CKTOTAL, CKMB, CKMBINDEX, TROPONINI in the last 168 hours. CBG: Recent Labs  Lab 07/08/20 2139 07/09/20 0649 07/09/20 1130 07/09/20 1619 07/09/20 2108  GLUCAP 104* 99 123* 125* 115*    Iron Studies: No results for input(s): IRON, TIBC, TRANSFERRIN, FERRITIN in the last 72 hours. Studies/Results: No results found.  Medications: Infusions: . sodium chloride    . sodium chloride    . sodium chloride    . heparin 1,000 Units/hr (07/08/20 1830)  . [START ON 07/11/2020] vancomycin      Scheduled Medications: . amiodarone  200 mg Oral Daily  . aspirin EC  81 mg Oral Daily  . atorvastatin  40 mg Oral q1800  . carvedilol  12.5 mg Oral BID WC  . Chlorhexidine Gluconate Cloth  6 each Topical Daily  .  Chlorhexidine Gluconate Cloth  6 each Topical Q0600  . [START ON 07/16/2020] darbepoetin (ARANESP) injection - DIALYSIS  60 mcg Intravenous Q Sat-HD  . folic acid  0,479 mcg Oral Daily  . sodium bicarbonate  1,300 mg Oral TID  . sodium chloride flush  3 mL Intravenous Q12H    have reviewed scheduled and prn medications.  Physical Exam: General:NAD, comfortable  Heart:RRR, s1s2 nl Lungs:clear b/l, no crackle Abdomen:soft, Non-tender, non-distended Extremities:No edema, +ishemic toes.  Neurology: Alert awake and following commands, no asterixis  But tremor Dialysis access:  RIJ TDC c/d/i  Justin Mend 07/10/2020,10:53 AM  LOS: 9 days

## 2020-07-10 NOTE — Progress Notes (Signed)
ANTICOAGULATION CONSULT NOTE - Follow Up Consult  Pharmacy Consult for heparin Indication: atrial fibrillation  Allergies  Allergen Reactions  . Penicillins Other (See Comments)    Childhood reaction.    Patient Measurements: Height: 5\' 3"  (160 cm) Weight: 72.2 kg (159 lb 1.6 oz) IBW/kg (Calculated) : 52.4  Vital Signs: Temp: 97.5 F (36.4 C) (01/30 0452) Temp Source: Oral (01/30 0452) BP: 151/58 (01/30 0452) Pulse Rate: 56 (01/30 0452)  Labs: Recent Labs    07/08/20 0500 07/09/20 0432 07/10/20 0417  HGB 6.6* 8.4* 8.7*  HCT 20.2* 27.3* 28.1*  PLT 123* 122* 124*  APTT 79* 69* 83*  HEPARINUNFRC 0.95* 0.73* 0.55  CREATININE 4.45* 4.88* 5.00*    Estimated Creatinine Clearance: 10.4 mL/min (A) (by C-G formula based on SCr of 5 mg/dL (H)).  Assessment: 70 yo female went to PCP for N/V and found to have "abnormal labs" and sent to ED, found to be w/ AKI (baseline Scr 1.2, now >6), to transition from Eliquis for Afib to UFH; last dose of Eliquis taken 1/20 10a.  New HD, s/p arteriogram 1/28  Heparin level therapeutic, correlating with PTTs  Goal of Therapy:  Heparin level 0.3-0.7 units/ml  aPTT 66-102 seconds Monitor platelets by anticoagulation protocol: Yes   Plan:  Continue heparin at 1000 units / hr DC PTTs  Follow up for apixaban resumption  Thank you Anette Guarneri, PharmD 07/10/2020 7:54 AM

## 2020-07-10 NOTE — Plan of Care (Signed)

## 2020-07-10 NOTE — Progress Notes (Signed)
   ASSESSMENT & PLAN:  Kathryn Hancock is a 70 y.o. female with:   1) acute kidney injury on CKD. Likely to need HD going forward. TDC already placed. Vein mapping suggests adequate basilic vein in left arm. Will discuss with Dr. Carlis Abbott and add to schedule for first stage BVT tomorrow.  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 Plan semi-elective staged bypass fem-pop bypass if wounds worsen.   SUBJECTIVE:  No complaints. Right handed. Explained rationale for permanent dialysis access.  OBJECTIVE:  BP (!) 147/60 (BP Location: Right Arm)   Pulse 62   Temp 98 F (36.7 C) (Oral)   Resp 18   Ht 5\' 3"  (1.6 m)   Wt 72.2 kg   SpO2 96%   BMI 28.18 kg/m   Intake/Output Summary (Last 24 hours) at 07/10/2020 1209 Last data filed at 07/10/2020 1101 Gross per 24 hour  Intake 933 ml  Output 1125 ml  Net -192 ml    TDC in place [place] to median sternotomy. 2+ radial pulses  CBC Latest Ref Rng & Units 07/10/2020 07/09/2020 07/08/2020  WBC 4.0 - 10.5 K/uL 7.2 7.2 6.5  Hemoglobin 12.0 - 15.0 g/dL 8.7(L) 8.4(L) 6.6(LL)  Hematocrit 36.0 - 46.0 % 28.1(L) 27.3(L) 20.2(L)  Platelets 150 - 400 K/uL 124(L) 122(L) 123(L)     CMP Latest Ref Rng & Units 07/10/2020 07/09/2020 07/08/2020  Glucose 70 - 99 mg/dL 86 103(H) 85  BUN 8 - 23 mg/dL 38(H) 40(H) 37(H)  Creatinine 0.44 - 1.00 mg/dL 5.00(H) 4.88(H) 4.45(H)  Sodium 135 - 145 mmol/L 139 138 140  Potassium 3.5 - 5.1 mmol/L 4.1 4.1 3.7  Chloride 98 - 111 mmol/L 106 104 103  CO2 22 - 32 mmol/L 22 23 25   Calcium 8.9 - 10.3 mg/dL 8.6(L) 8.4(L) 8.3(L)  Total Protein 6.5 - 8.1 g/dL - - -  Total Bilirubin 0.3 - 1.2 mg/dL - - -  Alkaline Phos 38 - 126 U/L - - -  AST 15 - 41 U/L - - -  ALT 0 - 44 U/L - - -    Estimated Creatinine Clearance: 10.4 mL/min (A) (by C-G formula based on SCr of 5 mg/dL (H)).  [name]. Stanford Breed, MD [facility] Phone Number: [phone] 07/10/2020 12:09 PM

## 2020-07-10 NOTE — Progress Notes (Signed)
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  During her prior hospital stay at [facility] patient had worsening renal function and lower extremity ischemic change and postoperative A. fib. 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 Was instructed to repeat and the repeat on showed worsening and was advised to come to the ER. Patient states over the last 2 weeks patient has been worsening nausea vomiting unable to keep anything but denies any abdominal pain or diarrhea.  Subjective / 24h Interval events: Feels well this morning, complains of soreness in her toes but otherwise no chest pain, no shortness of breath, no nausea or vomiting  Assessment & Plan: Principal Problem Acute kidney injury on chronic kidney disease stage IIIb/metabolic acidosis -Possibly in the setting of atheroembolic disease, kidney function is worsening after recent surgery for left atrial myxoma.  Nephrology consulted, now started on dialysis, underwent catheter placement on 1/26. -Vein mapping per nephrology pending, vascular following.  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  If she is worse she would need staged bilateral femoral to above-knee popliteal artery bypass graft.  Currently stable, vascular surgery following  Nausea/vomiting -Could be secondary from uremia, improving.  Benign abdominal exam.  CT renal studies shows no  obstruction/hydronephrosis/pyelonephritis. -Continue Zofran as needed for nausea and vomiting.   -Symptoms resolved after initiation of dialysis  Hyperkalemia -In the setting of worsening kidney function.   -Resolved after Lokelma.  Managed with dialysis  Paroxysmal A. Fib -Rate control -Continue Coreg and amiodarone.  Hold Eliquis in the setting of worsening kidney function.  Continue heparin as per pharmacy.  Once no further procedures plans could probably be transitioned to Coumadin given new onset dialysis needs.  Hypertension -Stable.  Continue amlodipine and Coreg  Type 2 diabetes mellitus -Well controlled.  A1c 5.6% SSI discontinued due to hypoglycemia.  Anemia of chronic disease -Macrocytosis.    She is status post unit of packed red blood cells for hemoglobin of 6.6, currently improved and hemoglobin is stable at 8.7  Recent open heart surgery for left atrial myxoma at [facility]: -In December 2021.  Patient denies an chest pain or shortness of breath.  Continue to monitor -Continue aspirin, statin  History of abdominal aortic aneurysm  -Reviewed CT renal study showed unchanged from prior exam back to January 15/2021.  Tobacco abuse -smokes 1 pack of cigarettes per day-tells me that she is working on cessation.  Cuts back from 2 packs to 1 pack.   Scheduled Meds: . amiodarone  200 mg Oral Daily  . aspirin EC  81 mg Oral Daily  . atorvastatin  40 mg Oral q1800  . carvedilol  12.5 mg Oral BID WC  . Chlorhexidine Gluconate Cloth  6 each Topical Daily  . Chlorhexidine Gluconate Cloth  6 each Topical Q0600  . [START ON 07/16/2020] darbepoetin (ARANESP) injection - DIALYSIS  60 mcg Intravenous Q Sat-HD  . folic acid  6,063 mcg Oral Daily  . sodium bicarbonate  1,300 mg Oral TID  . sodium  chloride flush  3 mL Intravenous Q12H   Continuous Infusions: . sodium chloride    . sodium chloride    . sodium chloride    . heparin 1,000 Units/hr (07/08/20 1830)    PRN Meds:.sodium chloride, sodium chloride, sodium chloride, acetaminophen **OR** acetaminophen, alteplase, heparin, hydrALAZINE, labetalol, lidocaine (PF), lidocaine-prilocaine, ondansetron (ZOFRAN) IV, oxyCODONE, pentafluoroprop-tetrafluoroeth, sodium chloride flush  Diet Orders (From admission, onward)    Start     Ordered   07/08/20 1236  Diet Heart Room service appropriate? Yes; Fluid consistency: Thin  Diet effective now       Question Answer Comment  Room service appropriate? Yes   Fluid consistency: Thin      07/08/20 1235          DVT prophylaxis:      Code Status: Full Code  Family Communication: none   Status is: Inpatient  Remains inpatient appropriate because:Inpatient level of care appropriate due to severity of illness   Dispo: The patient is from: Home              Anticipated d/c is to: Home              Anticipated d/c date is: 2 days              Patient currently is not medically stable to d/c.   Difficult to place patient No  Level of care: Telemetry Medical  Consultants:  Nephrology  Vascular surgery   Procedures:  Arteriogram   Microbiology  None   Antimicrobials: None     Objective: Vitals:   07/09/20 1619 07/09/20 2014 07/10/20 0452 07/10/20 0500  BP: (!) 145/66 (!) 148/68 (!) 151/58   Pulse: 61 65 (!) 56   Resp: 18     Temp: 98.6 F (37 C) 98.3 F (36.8 C) (!) 97.5 F (36.4 C)   TempSrc:  Oral Oral   SpO2: 93% 95% 95%   Weight:  72.4 kg  72.2 kg  Height:        Intake/Output Summary (Last 24 hours) at 07/10/2020 0857 Last data filed at 07/10/2020 3474 Gross per 24 hour  Intake 993 ml  Output 1150 ml  Net -157 ml   Filed Weights   07/08/20 0500 07/09/20 2014 07/10/20 0500  Weight: 69.9 kg 72.4 kg 72.2 kg    Examination:  Constitutional: NAD Eyes: No icterus ENMT: mmm Neck: normal, supple Respiratory: Lungs are clear bilaterally, no wheezing, no crackles Cardiovascular: Regular rate and rhythm, no murmurs,  no edema Abdomen: Soft, nontender, nondistended, bowel sounds positive Musculoskeletal: no clubbing / cyanosis.  Skin: No rashes seen Neurologic: No focal deficits  Data Reviewed: I have independently reviewed following labs and imaging studies   CBC: Recent Labs  Lab 07/06/20 0026 07/07/20 0323 07/08/20 0500 07/09/20 0432 07/10/20 0417  WBC 7.6 7.3 6.5 7.2 7.2  HGB 7.3* 7.0* 6.6* 8.4* 8.7*  HCT 22.3* 22.4* 20.2* 27.3* 28.1*  MCV 100.0 102.3* 100.5* 100.7* 101.4*  PLT 145* 133* 123* 122* 259*   Basic Metabolic Panel: Recent Labs  Lab 07/04/20 0841 07/05/20 0329 07/06/20 0026 07/07/20 1420 07/08/20 0500 07/09/20 0432 07/10/20 0417  NA 137  137 137 138 137 140 138 139  K 4.4  4.3 4.2 4.5 4.1 3.7 4.1 4.1  CL 102  101 101 102 103 103 104 106  CO2 21*  22 22 24 23 25 23 22   GLUCOSE 118*  124* 90 98 143* 85 103* 86  BUN 72*  70*  70* 68* 65* 37* 40* 38*  CREATININE 5.70*  5.82* 6.09* 6.04* 6.17* 4.45* 4.88* 5.00*  CALCIUM 8.6*  8.7* 8.7* 8.8* 8.3* 8.3* 8.4* 8.6*  PHOS 5.3* 5.4*  --  5.1*  --  4.2 4.5   Liver Function Tests: Recent Labs  Lab 07/04/20 0841 07/05/20 0329 07/07/20 1420 07/09/20 0432 07/10/20 0417  ALBUMIN 2.5* 2.5* 2.2* 2.2* 2.1*   Coagulation Profile: No results for input(s): INR, PROTIME in the last 168 hours. HbA1C: No results for input(s): HGBA1C in the last 72 hours. CBG: Recent Labs  Lab 07/08/20 2139 07/09/20 0649 07/09/20 1130 07/09/20 1619 07/09/20 2108  GLUCAP 104* 99 123* 125* 115*    Recent Results (from the past 240 hour(s))  SARS CORONAVIRUS 2 (TAT 6-24 HRS) Nasopharyngeal Nasopharyngeal Swab     Status: None   Collection Time: 07/01/20  5:46 AM   Specimen: Nasopharyngeal Swab  Result Value Ref Range Status   SARS Coronavirus 2 NEGATIVE NEGATIVE Final    Comment: (NOTE) SARS-CoV-2 target nucleic acids are NOT DETECTED.  The SARS-CoV-2 RNA is generally detectable in upper and lower respiratory specimens during the  acute phase of infection. Negative results do not preclude SARS-CoV-2 infection, do not rule out co-infections with other pathogens, and should not be used as the sole basis for treatment or other patient management decisions. Negative results must be combined with clinical observations, patient history, and epidemiological information. The expected result is Negative.  Fact Sheet for Patients: SugarRoll.be  Fact Sheet for Healthcare Providers: https://www.woods-mathews.com/  This test is not yet approved or cleared by the [place] FDA and  has been authorized for detection and/or diagnosis of SARS-CoV-2 by FDA under an Emergency Use Authorization (EUA). This EUA will remain  in effect (meaning this test can be used) for the duration of the COVID-19 declaration under Se ction 564(b)(1) of the Act, 21 U.S.C. section 360bbb-3(b)(1), unless the authorization is terminated or revoked sooner.  Performed at [facility], [place] [address] [place], [place] [address]      Radiology Studies: No results found. Marzetta Board, MD, PhD [facility]  Between 7 am - 7 pm I am available, please contact me via Amion or Securechat  Between 7 pm - 7 am I am not available, please contact night coverage MD/APP via Amion

## 2020-07-11 ENCOUNTER — Inpatient Hospital Stay (HOSPITAL_COMMUNITY): Payer: Medicare Other | Admitting: Anesthesiology

## 2020-07-11 ENCOUNTER — Encounter (HOSPITAL_COMMUNITY)
Admission: EM | Disposition: A | Discharge status: Home Health | Payer: Self-pay | Source: Home / Self Care | Attending: Family Medicine

## 2020-07-11 ENCOUNTER — Encounter (HOSPITAL_COMMUNITY): Payer: Self-pay | Admitting: Internal Medicine

## 2020-07-11 DIAGNOSIS — N179 Acute kidney failure, unspecified: Secondary | ICD-10-CM

## 2020-07-11 DIAGNOSIS — N185 Chronic kidney disease, stage 5: Secondary | ICD-10-CM

## 2020-07-11 HISTORY — PX: AV FISTULA PLACEMENT: SHX1204

## 2020-07-11 LAB — BASIC METABOLIC PANEL
Anion gap: 9 (ref 5–15)
BUN: 24 mg/dL — ABNORMAL HIGH (ref 8–23)
CO2: 25 mmol/L (ref 22–32)
Calcium: 8.2 mg/dL — ABNORMAL LOW (ref 8.9–10.3)
Chloride: 103 mmol/L (ref 98–111)
Creatinine, Ser: 3.81 mg/dL — ABNORMAL HIGH (ref 0.44–1.00)
GFR, Estimated: 12 mL/min — ABNORMAL LOW (ref >60)
Glucose, Bld: 119 mg/dL — ABNORMAL HIGH (ref 70–99)
Potassium: 4 mmol/L (ref 3.5–5.1)
Sodium: 137 mmol/L (ref 135–145)

## 2020-07-11 LAB — GLUCOSE, CAPILLARY
Glucose-Capillary: 98 mg/dL (ref 70–99)
Glucose-Capillary: 87 mg/dL (ref 70–99)
Glucose-Capillary: 100 mg/dL — ABNORMAL HIGH (ref 70–99)
Glucose-Capillary: 115 mg/dL — ABNORMAL HIGH (ref 70–99)

## 2020-07-11 LAB — CBC
HCT: 25.6 % — ABNORMAL LOW (ref 36.0–46.0)
Hemoglobin: 8.5 g/dL — ABNORMAL LOW (ref 12.0–15.0)
MCH: 33.1 pg (ref 26.0–34.0)
MCHC: 33.2 g/dL (ref 30.0–36.0)
MCV: 99.6 fL (ref 80.0–100.0)
Platelets: 115 10*3/uL — ABNORMAL LOW (ref 150–400)
RBC: 2.57 MIL/uL — ABNORMAL LOW (ref 3.87–5.11)
RDW: 15.7 % — ABNORMAL HIGH (ref 11.5–15.5)
WBC: 7.3 10*3/uL (ref 4.0–10.5)
nRBC: 0 % (ref 0.0–0.2)

## 2020-07-11 LAB — HEPARIN LEVEL (UNFRACTIONATED): Heparin Unfractionated: 0.5 IU/mL (ref 0.30–0.70)

## 2020-07-11 LAB — PROTIME-INR
INR: 1.2 (ref 0.8–1.2)
Prothrombin Time: 14.8 seconds (ref 11.4–15.2)

## 2020-07-11 SURGERY — ARTERIOVENOUS (AV) FISTULA CREATION
Anesthesia: Monitor Anesthesia Care | Site: Arm Upper | Laterality: Left

## 2020-07-11 MED ORDER — FENTANYL CITRATE (PF) 100 MCG/2ML IJ SOLN
INTRAMUSCULAR | Status: AC
  Administered 2020-07-11: 50 ug via INTRAVENOUS
  Filled 2020-07-11: qty 2

## 2020-07-11 MED ORDER — PROPOFOL 500 MG/50ML IV EMUL
INTRAVENOUS | Status: DC | PRN
  Administered 2020-07-11: 100 ug/kg/min via INTRAVENOUS

## 2020-07-11 MED ORDER — DEXAMETHASONE SODIUM PHOSPHATE 10 MG/ML IJ SOLN
INTRAMUSCULAR | Status: AC
  Filled 2020-07-11: qty 3

## 2020-07-11 MED ORDER — SODIUM CHLORIDE 0.9 % IV SOLN: INTRAVENOUS | Status: DC

## 2020-07-11 MED ORDER — FENTANYL CITRATE (PF) 100 MCG/2ML IJ SOLN: 25.0000 ug | INTRAMUSCULAR | Status: DC | PRN

## 2020-07-11 MED ORDER — CHLORHEXIDINE GLUCONATE 0.12 % MT SOLN: 15.0000 mL | Freq: Once | OROMUCOSAL | Status: AC

## 2020-07-11 MED ORDER — DARBEPOETIN ALFA 60 MCG/0.3ML IJ SOSY
60.0000 ug | PREFILLED_SYRINGE | INTRAMUSCULAR | Status: DC
  Administered 2020-07-12: 60 ug via INTRAVENOUS
  Filled 2020-07-11: qty 0.3

## 2020-07-11 MED ORDER — 0.9 % SODIUM CHLORIDE (POUR BTL) OPTIME
TOPICAL | Status: DC | PRN
  Administered 2020-07-11: 1000 mL

## 2020-07-11 MED ORDER — ONDANSETRON HCL 4 MG/2ML IJ SOLN: 4.0000 mg | Freq: Once | INTRAMUSCULAR | Status: DC | PRN

## 2020-07-11 MED ORDER — ONDANSETRON HCL 4 MG/2ML IJ SOLN
INTRAMUSCULAR | Status: AC
  Filled 2020-07-11: qty 6

## 2020-07-11 MED ORDER — LIDOCAINE 2% (20 MG/ML) 5 ML SYRINGE
INTRAMUSCULAR | Status: AC
  Filled 2020-07-11: qty 5

## 2020-07-11 MED ORDER — LIDOCAINE HCL (PF) 2 % IJ SOLN
INTRAMUSCULAR | Status: DC | PRN
  Administered 2020-07-11: 300 mg via PERINEURAL

## 2020-07-11 MED ORDER — MIDAZOLAM HCL 2 MG/2ML IJ SOLN: 1.0000 mg | Freq: Once | INTRAMUSCULAR | Status: AC

## 2020-07-11 MED ORDER — HEPARIN SODIUM (PORCINE) 1000 UNIT/ML IJ SOLN
INTRAMUSCULAR | Status: DC | PRN
  Administered 2020-07-11: 3000 [IU] via INTRAVENOUS

## 2020-07-11 MED ORDER — SODIUM CHLORIDE 0.9 % IV SOLN
INTRAVENOUS | Status: AC
  Filled 2020-07-11: qty 1.2

## 2020-07-11 MED ORDER — CHLORHEXIDINE GLUCONATE 0.12 % MT SOLN
OROMUCOSAL | Status: AC
  Administered 2020-07-11: 15 mL via OROMUCOSAL
  Filled 2020-07-11: qty 15

## 2020-07-11 MED ORDER — ORAL CARE MOUTH RINSE: 15.0000 mL | Freq: Once | OROMUCOSAL | Status: AC

## 2020-07-11 MED ORDER — ACETAMINOPHEN 10 MG/ML IV SOLN: 1000.0000 mg | Freq: Once | INTRAVENOUS | Status: DC | PRN

## 2020-07-11 MED ORDER — MIDAZOLAM HCL 2 MG/2ML IJ SOLN
INTRAMUSCULAR | Status: AC
  Administered 2020-07-11: 1 mg via INTRAVENOUS
  Filled 2020-07-11: qty 2

## 2020-07-11 MED ORDER — SODIUM CHLORIDE 0.9 % IV SOLN
INTRAVENOUS | Status: DC | PRN
  Administered 2020-07-11: 12:00:00 500 mL

## 2020-07-11 MED ORDER — FENTANYL CITRATE (PF) 100 MCG/2ML IJ SOLN: 50.0000 ug | Freq: Once | INTRAMUSCULAR | Status: AC

## 2020-07-11 MED ORDER — EPHEDRINE 5 MG/ML INJ
INTRAVENOUS | Status: AC
  Filled 2020-07-11: qty 10

## 2020-07-11 SURGICAL SUPPLY — 33 items
ARMBAND PINK RESTRICT EXTREMIT (MISCELLANEOUS) ×2 IMPLANT
BLADE CLIPPER SURG (BLADE) IMPLANT
CANISTER SUCT 3000ML PPV (MISCELLANEOUS) ×2 IMPLANT
CLIP VESOCCLUDE MED 6/CT (CLIP) ×2 IMPLANT
CLIP VESOCCLUDE SM WIDE 6/CT (CLIP) ×2 IMPLANT
COVER PROBE W GEL 5X96 (DRAPES) ×2 IMPLANT
DECANTER SPIKE VIAL GLASS SM (MISCELLANEOUS) ×2 IMPLANT
DERMABOND ADVANCED (GAUZE/BANDAGES/DRESSINGS) ×1
DERMABOND ADVANCED .7 DNX12 (GAUZE/BANDAGES/DRESSINGS) ×1 IMPLANT
ELECT REM PT RETURN 9FT ADLT (ELECTROSURGICAL) ×2
ELECTRODE REM PT RTRN 9FT ADLT (ELECTROSURGICAL) ×1 IMPLANT
GLOVE BIO SURGEON STRL SZ7.5 (GLOVE) ×2 IMPLANT
GLOVE SRG 8 PF TXTR STRL LF DI (GLOVE) ×1 IMPLANT
GLOVE SURG UNDER POLY LF SZ7 (GLOVE) ×2 IMPLANT
GLOVE SURG UNDER POLY LF SZ8 (GLOVE) ×1
GOWN STRL REUS W/ TWL LRG LVL3 (GOWN DISPOSABLE) ×2 IMPLANT
GOWN STRL REUS W/ TWL XL LVL3 (GOWN DISPOSABLE) ×2 IMPLANT
GOWN STRL REUS W/TWL LRG LVL3 (GOWN DISPOSABLE) ×2
GOWN STRL REUS W/TWL XL LVL3 (GOWN DISPOSABLE) ×2
HEMOSTAT SPONGE AVITENE ULTRA (HEMOSTASIS) IMPLANT
KIT BASIN OR (CUSTOM PROCEDURE TRAY) ×2 IMPLANT
KIT TURNOVER KIT B (KITS) ×2 IMPLANT
NS IRRIG 1000ML POUR BTL (IV SOLUTION) ×2 IMPLANT
PACK CV ACCESS (CUSTOM PROCEDURE TRAY) ×2 IMPLANT
PAD ARMBOARD 7.5X6 YLW CONV (MISCELLANEOUS) ×4 IMPLANT
SUT MNCRL AB 4-0 PS2 18 (SUTURE) ×2 IMPLANT
SUT PROLENE 6 0 BV (SUTURE) ×2 IMPLANT
SUT PROLENE 7 0 BV 1 (SUTURE) IMPLANT
SUT VIC AB 3-0 SH 27 (SUTURE) ×1
SUT VIC AB 3-0 SH 27X BRD (SUTURE) ×1 IMPLANT
TOWEL GREEN STERILE (TOWEL DISPOSABLE) ×2 IMPLANT
UNDERPAD 30X36 HEAVY ABSORB (UNDERPADS AND DIAPERS) ×2 IMPLANT
WATER STERILE IRR 1000ML POUR (IV SOLUTION) ×2 IMPLANT

## 2020-07-11 NOTE — Anesthesia Procedure Notes (Signed)
Anesthesia Regional Block: Supraclavicular block   Pre-Anesthetic Checklist: ,, timeout performed, Correct Patient, Correct Site, Correct Laterality, Correct Procedure, Correct Position, site marked, Risks and benefits discussed,  Surgical consent,  Pre-op evaluation,  At surgeon's request and post-op pain management  Laterality: Left  Prep: Dura Prep       Needles:  Injection technique: Single-shot  Needle Type: Echogenic Stimulator Needle     Needle Length: 5cm  Needle Gauge: 20     Additional Needles:   Procedures:,,,, ultrasound used (permanent image in chart),,,,  Narrative:  Start time: 07/11/2020 11:00 AM End time: 07/11/2020 11:05 AM Injection made incrementally with aspirations every 5 mL.  Performed by: Personally  Anesthesiologist: Darral Dash, DO  Additional Notes: Patient identified. Risks/Benefits/Options discussed with patient including but not limited to bleeding, infection, nerve damage, failed block, incomplete pain control. Patient expressed understanding and wished to proceed. All questions were answered. Sterile technique was used throughout the entire procedure. Please see nursing notes for vital signs. Aspirated in 5cc intervals with injection for negative confirmation. Patient was given instructions on fall risk and not to get out of bed. All questions and concerns addressed with instructions to call with any issues or inadequate analgesia.

## 2020-07-11 NOTE — Progress Notes (Signed)
[facility] [place]  Subjective  - no complaints.   Objective (!) 131/47 (!) 58 98.8 F (37.1 C) 18 93%  Intake/Output Summary (Last 24 hours) at 07/11/2020 1053 Last data filed at 07/11/2020 0446 Gross per 24 hour  Intake 360 ml  Output 700 ml  Net -340 ml    Left radial and brachial pulse palpable RIJ tunneled catheter  Laboratory Lab Results: Recent Labs    07/10/20 0417 07/11/20 0304  WBC 7.2 7.3  HGB 8.7* 8.5*  HCT 28.1* 25.6*  PLT 124* 115*   BMET Recent Labs    07/10/20 0417 07/11/20 0304  NA 139 137  K 4.1 4.0  CL 106 103  CO2 22 25  GLUCOSE 86 119*  BUN 38* 24*  CREATININE 5.00* 3.81*  CALCIUM 8.6* 8.2*    COAG Lab Results  Component Value Date   INR 1.2 07/11/2020   No results found for: PTT  Assessment/Planning:  Discussed plan for left arm AV fistula.  Basilic vein looks more usable than cephalic vein but will reevaluate in the OR with ultrasound.  Discussed risk and benefits with the patient and also discussed that if her basilic vein appears more adequate this would likely require two operations.  Instructed to hold heparin on-call to the OR and would keep heparin off tonight until we evaluate her tomorrow morning.  [name] 07/11/2020 10:53 AM --

## 2020-07-11 NOTE — Progress Notes (Signed)
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  During her prior hospital stay at [facility] patient had worsening renal function and lower extremity ischemic change and postoperative A. fib. 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 Was instructed to repeat and the repeat on showed worsening and was advised to come to the ER. Patient states over the last 2 weeks patient has been worsening nausea vomiting unable to keep anything but denies any abdominal pain or diarrhea.  Subjective / 24h Interval events: Patient seen and examined this morning.  She has no complaints.  Assessment & Plan: Principal Problem Acute kidney injury on chronic kidney disease stage IIIb/metabolic acidosis -Possibly in the setting of atheroembolic disease, kidney function is worsening after recent surgery for left atrial myxoma.  Nephrology consulted, now started on dialysis, underwent catheter placement on 1/26. -Vein mapping done.  Patient is scheduled for tunneled dialysis catheter today.  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  If she is worse she would need staged bilateral femoral to above-knee popliteal artery bypass graft.  Currently stable, vascular surgery following.  Appreciate help.  Nausea/vomiting -Could be secondary from uremia, improving.  Benign abdominal exam.  CT renal studies shows no obstruction/hydronephrosis/pyelonephritis. -Continue Zofran as needed for nausea and vomiting.    -Symptoms resolved after initiation of dialysis  Hyperkalemia -In the setting of worsening kidney function.   -Resolved after Lokelma.  Managed with dialysis  Paroxysmal A. Fib -Rate controlled. -Continue Coreg and amiodarone.  Hold Eliquis in the setting of worsening kidney function.  Continue heparin as per pharmacy.  Once no further procedures plans could probably be transitioned to Coumadin given new onset dialysis needs.  Hypertension -Stable.  Continue amlodipine and Coreg  Type 2 diabetes mellitus -Well controlled.  A1c 5.6% SSI discontinued due to hypoglycemia.  Anemia of chronic disease -Macrocytosis.    She is status post unit of packed red blood cells for hemoglobin of 6.6, currently improved and hemoglobin is stable at 8.7  Recent open heart surgery for left atrial myxoma at [facility]: -In December 2021.  Patient denies an chest pain or shortness of breath.  Continue to monitor -Continue aspirin, statin  History of abdominal aortic aneurysm  -Reviewed CT renal study showed unchanged from prior exam back to January 15/2021.  Tobacco abuse -smokes 1 pack of cigarettes per day-tells me that she is working on cessation.  Cuts back from 2 packs to 1 pack.   Scheduled Meds: . [MAR Hold] amiodarone  200 mg Oral Daily  . [MAR Hold] aspirin EC  81 mg Oral Daily  . [MAR Hold] atorvastatin  40 mg Oral q1800  . [MAR Hold] carvedilol  12.5 mg Oral BID WC  . [MAR Hold] Chlorhexidine Gluconate Cloth  6 each Topical Daily  . [MAR Hold] Chlorhexidine Gluconate Cloth  6 each Topical Q0600  . [MAR Hold] darbepoetin (ARANESP) injection - DIALYSIS  60 mcg Intravenous Q Tue-HD  . fentaNYL      . [MAR Hold] folic acid  4,854 mcg Oral Daily  .  midazolam      . [MAR Hold] sodium bicarbonate  1,300 mg Oral TID  . [MAR Hold] sodium chloride flush  3 mL Intravenous Q12H   Continuous Infusions: . [MAR Hold] sodium chloride    . [MAR Hold] sodium chloride    . [MAR Hold]  sodium chloride    . sodium chloride 10 mL/hr at 07/11/20 1040  . heparin Stopped (07/11/20 0950)  . [MAR Hold] vancomycin     PRN Meds:.[MAR Hold] sodium chloride, [MAR Hold] sodium chloride, [MAR Hold] sodium chloride, [MAR Hold] acetaminophen **OR** [MAR Hold] acetaminophen, [MAR Hold] alteplase, [MAR Hold] heparin, [MAR Hold] hydrALAZINE, [MAR Hold] labetalol, [MAR Hold] lidocaine (PF), [MAR Hold] lidocaine-prilocaine, [MAR Hold] ondansetron (ZOFRAN) IV, [MAR Hold] oxyCODONE, [MAR Hold] pentafluoroprop-tetrafluoroeth, [MAR Hold] sodium chloride flush  Diet Orders (From admission, onward)    Start     Ordered   07/11/20 0001  Diet NPO time specified Except for: Sips with Meds  Diet effective midnight       Comments: Hold hypoglycemics and diuretic medications  Question:  Except for  Answer:  Sips with Meds   07/10/20 0931          DVT prophylaxis:      Code Status: Full Code  Family Communication: none   Status is: Inpatient  Remains inpatient appropriate because:Inpatient level of care appropriate due to severity of illness   Dispo: The patient is from: Home              Anticipated d/c is to: Home              Anticipated d/c date is: 2 days              Patient currently is not medically stable to d/c.   Difficult to place patient No  Level of care: Telemetry Medical  Consultants:  Nephrology  Vascular surgery   Procedures:  Arteriogram   Microbiology  None   Antimicrobials: None     Objective: Vitals:   07/10/20 1800 07/10/20 1817 07/10/20 2019 07/11/20 0447  BP: (!) 148/73 (!) 149/69 (!) 147/62 (!) 131/47  Pulse: 66 66 63 (!) 58  Resp:  18    Temp:  98 F (36.7 C) 98.4 F (36.9 C) 98.8 F (37.1 C)  TempSrc:  Oral Oral   SpO2:  98% 95% 93%  Weight:  73 kg 73.1 kg   Height:        Intake/Output Summary (Last 24 hours) at 07/11/2020 1100 Last data filed at 07/11/2020 0446 Gross per 24 hour  Intake 360 ml  Output 700 ml  Net -340 ml   Filed  Weights   07/10/20 1540 07/10/20 1817 07/10/20 2019  Weight: 73.1 kg 73 kg 73.1 kg    Examination: General exam: Appears calm and comfortable  Respiratory system: Clear to auscultation. Respiratory effort normal. Cardiovascular system: S1 & S2 heard, RRR. No JVD, murmurs, rubs, gallops or clicks. No pedal edema. Gastrointestinal system: Abdomen is nondistended, soft and nontender. No organomegaly or masses felt. Normal bowel sounds heard. Central nervous system: Alert and oriented. No focal neurological deficits. Extremities: Symmetric 5 x 5 power. Skin: Multiple necrotic toes bilateral feet as pictured below Psychiatry: Judgement and insight appear normal. Mood & affect appropriate.      Data Reviewed: I have independently reviewed following labs and imaging studies   CBC: Recent Labs  Lab 07/07/20 0323 07/08/20 0500 07/09/20 0432 07/10/20 0417 07/11/20 0304  WBC 7.3 6.5 7.2 7.2 7.3  HGB 7.0* 6.6* 8.4* 8.7* 8.5*  HCT 22.4* 20.2* 27.3* 28.1* 25.6*  MCV 102.3* 100.5* 100.7* 101.4* 99.6  PLT 133* 123* 122* 124* 825*   Basic Metabolic Panel: Recent Labs  Lab 07/05/20 0329 07/06/20 0026 07/07/20 1420 07/08/20 0500 07/09/20 0432 07/10/20 0417 07/11/20 0304  NA 137   < > 137 140 138 139 137  K 4.2   < > 4.1 3.7 4.1 4.1 4.0  CL 101   < > 103 103 104 106 103  CO2 22   < > 23 25 23 22 25   GLUCOSE 90   < > 143* 85 103* 86 119*  BUN 70*   < > 65* 37* 40* 38* 24*  CREATININE 6.09*   < > 6.17* 4.45* 4.88* 5.00* 3.81*  CALCIUM 8.7*   < > 8.3* 8.3* 8.4* 8.6* 8.2*  PHOS 5.4*  --  5.1*  --  4.2 4.5  --    < > = values in this interval not displayed.   Liver Function Tests: Recent Labs  Lab 07/05/20 0329 07/07/20 1420 07/09/20 0432 07/10/20 0417  ALBUMIN 2.5* 2.2* 2.2* 2.1*   Coagulation Profile: Recent Labs  Lab 07/11/20 0304  INR 1.2   HbA1C: No results for input(s): HGBA1C in the last 72 hours. CBG: Recent Labs  Lab 07/10/20 1114 07/10/20 1837  07/10/20 2021 07/11/20 0659 07/11/20 0948  GLUCAP 89 81 141* 98 87    No results found for this or any previous visit (from the past 240 hour(s)).   Radiology Studies: No results found. Darliss Cheney, MD [facility]  Between 7 am - 7 pm I am available, please contact me via Amion or [place]  Between 7 pm - 7 am I am not available, please contact night coverage MD/APP via Amion

## 2020-07-11 NOTE — Anesthesia Procedure Notes (Signed)
Procedure Name: MAC Date/Time: 07/11/2020 11:40 AM Performed by: Griffin Dakin, CRNA Pre-anesthesia Checklist: Patient identified, Emergency Drugs available, Suction available, Patient being monitored and Timeout performed Patient Re-evaluated:Patient Re-evaluated prior to induction Oxygen Delivery Method: Simple face mask Induction Type: IV induction Placement Confirmation: positive ETCO2 and breath sounds checked- equal and bilateral Dental Injury: Teeth and Oropharynx as per pre-operative assessment

## 2020-07-11 NOTE — Transfer of Care (Signed)
Immediate Anesthesia Transfer of Care Note  Patient: Kathryn Hancock  Procedure(s) Performed: LEFT FIRST STAGE BRACHIOBASILIC UPPER EXTREMITY ARTERIOVENOUS (AV) FISTULA CREATION (Left Arm Upper)  Patient Location: PACU  Anesthesia Type:MAC combined with regional for post-op pain  Level of Consciousness: drowsy  Airway & Oxygen Therapy: Patient Spontanous Breathing and Patient connected to face mask oxygen  Post-op Assessment: Report given to RN and Post -op Vital signs reviewed and stable  Post vital signs: Reviewed and stable  Last Vitals:  Vitals Value Taken Time  BP 117/53 07/11/20 1247  Temp 36.6 C 07/11/20 1247  Pulse 52 07/11/20 1257  Resp 10 07/11/20 1257  SpO2 90 % 07/11/20 1257  Vitals shown include unvalidated device data.  Last Pain:  Vitals:   07/11/20 1247  TempSrc:   PainSc: 0-No pain      Patients Stated Pain Goal: 2 (26/20/35 5974)  Complications: No complications documented.

## 2020-07-11 NOTE — Op Note (Signed)
OPERATIVE NOTE   PROCEDURE: left first stage basilic vein transposition (brachiobasilic arteriovenous fistula) placement  PRE-OPERATIVE DIAGNOSIS: AKI on CKD  POST-OPERATIVE DIAGNOSIS: same  SURGEON: [name], MD  ASSISTANT(S): Roxy Horseman, PA  ANESTHESIA: MAC  ESTIMATED BLOOD LOSS: <25 mL  FINDING(S): 1.  Basilic vein: 2.5 mm, acceptable 2.  Brachial artery: 3.0 mm, atherosclerotic disease evident 3.  Venous outflow: palpable thrill  4.  Radial flow: dopplerable radial signal  SPECIMEN(S):  none  INDICATIONS:   Kathryn Hancock is a 70 y.o. female who presents with AKI on CKD and need for permanent hemodialysis access.  The patient is scheduled for left arm AVF and likely basilic vein based on preoperative vein mapping.  The patient is aware the risks include but are not limited to: bleeding, infection, steal syndrome, nerve damage, ischemic monomelic neuropathy, failure to mature, and need for additional procedures.  The patient is aware of the risks of the procedure and elects to proceed forward.  An assistant was needed for exposure and to expedite the case.   DESCRIPTION: After full informed written consent was obtained from the patient, the patient was brought back to the operating room and placed supine upon the operating table.  Prior to induction, the patient received IV antibiotics.   After obtaining adequate anesthesia, the patient was then prepped and draped in the standard fashion for a left arm access procedure.  I turned my attention first to identifying the patient's cephalic vein which was thrombosed and thickened in the upper arm and not usable.  The basilic vein was 2.5 - 3.0 mm and looked usable.    Using SonoSite guidance, the location of these vessels were marked out on the skin.   I made a transverse incision at the level of the antecubitum and dissected through the subcutaneous tissue and fascia to gain exposure of the brachial artery.   This was noted to be 3 mm in diameter externally.  This was dissected out proximally and distally and controlled with vessel loops .  I then dissected out the basilic vein.  This was noted to be 2.5 mm in diameter externally.  The distal segment of the vein was ligated with a  2-0 silk, and the vein was transected.  The proximal segment was interrogated with serial dilators.  The vein accepted up to a 3.5 mm dilator without any difficulty.  I then instilled the heparinized saline into the vein and clamped it.  At this point, I reset my exposure of the brachial artery.  The patient was given 3000 units IV heparin.  I then placed the artery under tension proximally and distally.  I made an arteriotomy with a #11 blade, and then I extended the arteriotomy with a Potts scissor.  The vein was then sewn to the artery in an end-to-side configuration with a running stitch of 6-0 Prolene.  Prior to completing this anastomosis, I allowed the vein and artery to backbleed.  There was no evidence of clot from any vessels.  I completed the anastomosis in the usual fashion and then released all vessel loops and clamps.    There was a palpable thrill in the venous outflow, and there was a dopplerable radial signal.  At this point, I irrigated out the surgical wound.  There was no further active bleeding.  The subcutaneous tissue was reapproximated with a running stitch of 3-0 Vicryl.  The skin was then reapproximated with a running subcuticular stitch of 4-0 Monocryl.  The skin was then cleaned, dried, and reinforced with Dermabond.  The patient tolerated this procedure well.   COMPLICATIONS: None  CONDITION: Stable  [name] MD [facility] Office: [place]   07/11/2020, 12:29 PM

## 2020-07-11 NOTE — Anesthesia Postprocedure Evaluation (Signed)
Anesthesia Post Note  Patient: Kathryn Hancock  Procedure(s) Performed: LEFT FIRST STAGE BRACHIOBASILIC UPPER EXTREMITY ARTERIOVENOUS (AV) FISTULA CREATION (Left Arm Upper)     Patient location during evaluation: PACU Anesthesia Type: Regional and MAC Level of consciousness: awake and alert Pain management: pain level controlled Vital Signs Assessment: post-procedure vital signs reviewed and stable Respiratory status: spontaneous breathing, nonlabored ventilation, respiratory function stable and patient connected to nasal cannula oxygen Cardiovascular status: stable and blood pressure returned to baseline Postop Assessment: no apparent nausea or vomiting Anesthetic complications: no   No complications documented.  Last Vitals:  Vitals:   07/11/20 1302 07/11/20 1317  BP: (!) 131/52 (!) 124/53  Pulse: (!) 53 (!) 50  Resp: 11 10  Temp:  36.4 C  SpO2: 95% 96%    Last Pain:  Vitals:   07/11/20 1317  TempSrc:   PainSc: 0-No pain                 [name]

## 2020-07-11 NOTE — Progress Notes (Signed)
PT Cancellation Note  Patient Details Name: Kathryn Hancock MRN: [id number] DOB: July 30, 1950   Cancelled Treatment:    Reason Eval/Treat Not Completed: Patient at procedure or test/unavailable has been in procedure all morning. Will continue efforts as time/schedule allow.    Windell Norfolk, DPT, PN1   Supplemental Physical Therapist [facility]    Pager [phone] Acute Rehab Office [phone]

## 2020-07-11 NOTE — Plan of Care (Signed)
  Problem: Education: Goal: Knowledge of General Education information will improve Description: Including pain rating scale, medication(s)/side effects and non-pharmacologic comfort measures Outcome: Progressing   Problem: Clinical Measurements: Goal: Diagnostic test results will improve Outcome: Progressing   Problem: Coping: Goal: Level of anxiety will decrease Outcome: Progressing   Problem: Clinical Measurements: Goal: Complications related to the disease process, condition or treatment will be avoided or minimized Outcome: Progressing

## 2020-07-11 NOTE — Progress Notes (Signed)
Patient ID: Kathryn Hancock, female   DOB: 10-21-1950, 70 y.o.   MRN: [id number] S: Just got back from PACU, s/p LUE BVT. O:BP (!) 124/53 (BP Location: Right Arm)   Pulse (!) 50   Temp 97.6 F (36.4 C)   Resp 10   Ht 5\' 3"  (1.6 m)   Wt 73.1 kg   SpO2 96%   BMI 28.55 kg/m   Intake/Output Summary (Last 24 hours) at 07/11/2020 1352 Last data filed at 07/11/2020 1241 Gross per 24 hour  Intake 470 ml  Output 375 ml  Net 95 ml   Intake/Output: I/O last 3 completed shifts: In: 36 [P.O.:940; I.V.:110] Out: 1300 [Urine:1300]  Intake/Output this shift:  Total I/O In: 350 [I.V.:350] Out: -  Weight change: 0.7 kg Gen: NAD CVS: rrr, no rub Resp: cta Abd: benign Ext: no edema, ischemic toes bilaterally, LUE BVT +T/B Dialysis access:  [facility]  Recent Labs  Lab 07/05/20 0329 07/06/20 0026 07/07/20 1420 07/08/20 0500 07/09/20 0432 07/10/20 0417 07/11/20 0304  NA 137 138 137 140 138 139 137  K 4.2 4.5 4.1 3.7 4.1 4.1 4.0  CL 101 102 103 103 104 106 103  CO2 22 24 23 25 23 22 25   GLUCOSE 90 98 143* 85 103* 86 119*  BUN 70* 68* 65* 37* 40* 38* 24*  CREATININE 6.09* 6.04* 6.17* 4.45* 4.88* 5.00* 3.81*  ALBUMIN 2.5*  --  2.2*  --  2.2* 2.1*  --   CALCIUM 8.7* 8.8* 8.3* 8.3* 8.4* 8.6* 8.2*  PHOS 5.4*  --  5.1*  --  4.2 4.5  --    Liver Function Tests: Recent Labs  Lab 07/07/20 1420 07/09/20 0432 07/10/20 0417  ALBUMIN 2.2* 2.2* 2.1*   No results for input(s): LIPASE, AMYLASE in the last 168 hours. No results for input(s): AMMONIA in the last 168 hours. CBC: Recent Labs  Lab 07/07/20 0323 07/08/20 0500 07/09/20 0432 07/10/20 0417 07/11/20 0304  WBC 7.3 6.5 7.2 7.2 7.3  HGB 7.0* 6.6* 8.4* 8.7* 8.5*  HCT 22.4* 20.2* 27.3* 28.1* 25.6*  MCV 102.3* 100.5* 100.7* 101.4* 99.6  PLT 133* 123* 122* 124* 115*   Cardiac Enzymes: No results for input(s): CKTOTAL, CKMB, CKMBINDEX, TROPONINI in the last 168 hours. CBG: Recent Labs  Lab 07/10/20 1837 07/10/20 2021  07/11/20 0659 07/11/20 0948 07/11/20 1248  GLUCAP 81 141* 98 87 100*    Iron Studies: No results for input(s): IRON, TIBC, TRANSFERRIN, FERRITIN in the last 72 hours. Studies/Results: No results found. Marland Kitchen amiodarone  200 mg Oral Daily  . aspirin EC  81 mg Oral Daily  . atorvastatin  40 mg Oral q1800  . carvedilol  12.5 mg Oral BID WC  . Chlorhexidine Gluconate Cloth  6 each Topical Daily  . Chlorhexidine Gluconate Cloth  6 each Topical Q0600  . [START ON 07/12/2020] darbepoetin (ARANESP) injection - DIALYSIS  60 mcg Intravenous Q Tue-HD  . folic acid  6,659 mcg Oral Daily  . sodium bicarbonate  1,300 mg Oral TID  . sodium chloride flush  3 mL Intravenous Q12H    BMET    Component Value Date/Time   NA 137 07/11/2020 0304   NA 139 05/26/2020 1158   K 4.0 07/11/2020 0304   CL 103 07/11/2020 0304   CO2 25 07/11/2020 0304   GLUCOSE 119 (H) 07/11/2020 0304   BUN 24 (H) 07/11/2020 0304   BUN 75 (H) 05/26/2020 1158   CREATININE 3.81 (H) 07/11/2020 0304   CALCIUM  8.2 (L) 07/11/2020 0304   GFRNONAA 12 (L) 07/11/2020 0304   GFRAA 16 (L) 05/26/2020 1158   CBC    Component Value Date/Time   WBC 7.3 07/11/2020 0304   RBC 2.57 (L) 07/11/2020 0304   HGB 8.5 (L) 07/11/2020 0304   HGB 9.8 (L) 05/26/2020 1158   HCT 25.6 (L) 07/11/2020 0304   HCT 29.6 (L) 05/26/2020 1158   PLT 115 (L) 07/11/2020 0304   PLT 286 05/26/2020 1158   MCV 99.6 07/11/2020 0304   MCV 100 (H) 05/26/2020 1158   MCH 33.1 07/11/2020 0304   MCHC 33.2 07/11/2020 0304   RDW 15.7 (H) 07/11/2020 0304   RDW 14.1 05/26/2020 1158     Assessment/Plan:  1. 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  Started on HD 1/27 and had second session on 1/30.  Plan for HD tomorrow and will ask renal navigator to start setting up outpatient HD spot in [place]. 2. Vascular access:  RIJ TDC placed 07/07/20, LUE BVT created 07/11/20 3. Anemia of ESRD- s/p transfusion and now  on ESA 4. HTN- stable 5. A fib on heparin gtt 6. Bilateral PAD with gangrenous changes.  Per primary and VVS following and may need bypass surgery. 7. Disposition- will need outpatient HD to be arranged.   Donetta Potts, MD [employer] [phone]

## 2020-07-11 NOTE — Anesthesia Preprocedure Evaluation (Addendum)
Anesthesia Evaluation  Patient identified by MRN, date of birth, ID band Patient awake    Reviewed: NPO status , Patient's Chart, lab work & pertinent test results  Airway Mallampati: II  TM Distance: >3 FB Neck ROM: Full    Dental  (+) Upper Dentures, Lower Dentures   Pulmonary Current Smoker (110pk yrs) and Patient abstained from smoking.,    Pulmonary exam normal        Cardiovascular hypertension, Pt. on home beta blockers + CAD   Rhythm:Regular Rate:Normal  AAA 4.5cm as of 01/21. 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 Bowel sounds: normal.  Peds  Hematology  (+) anemia ,   Anesthesia Other Findings   Reproductive/Obstetrics                          Anesthesia Physical Anesthesia Plan  ASA: III  Anesthesia Plan: MAC and Regional   Post-op Pain Management:    Induction: Intravenous  PONV Risk Score and Plan: 1 and Ondansetron and Propofol infusion  Airway Management Planned: Simple Face Mask, Natural Airway and Nasal Cannula  Additional Equipment: None  Intra-op Plan:   Post-operative Plan:   Informed Consent: I have reviewed the patients History and Physical, chart, labs and discussed the procedure including the risks, benefits and alternatives for the proposed anesthesia with the patient or authorized representative who has indicated his/her understanding and acceptance.     Dental advisory given  Plan Discussed with:   Anesthesia Plan Comments: (Lab Results      Component                Value               Date                      WBC                      7.3                 07/11/2020                 HGB                      8.5 (L)             07/11/2020                HCT                      25.6 (L)            07/11/2020                MCV                      99.6                07/11/2020                PLT  115 (L)             07/11/2020           Lab Results      Component                Value               Date                      NA                       137                 07/11/2020                K                        4.0                 07/11/2020                CO2                      25                  07/11/2020                GLUCOSE                  119 (H)             07/11/2020                BUN                      24 (H)              07/11/2020                CREATININE               3.81 (H)            07/11/2020                CALCIUM                  8.2 (L)             07/11/2020                GFRNONAA                 12 (L)              07/11/2020                GFRAA                    16 (L)              05/26/2020           ECHO 02/21: 1. Left ventricular ejection fraction, by estimation, is 60 to 65%. The  left ventricle has normal function. The left ventricle has no regional  wall motion abnormalities. There is mildly increased left ventricular  hypertrophy. Left ventricular diastolic  parameters are consistent with Grade I diastolic dysfunction (impaired  relaxation). Elevated left ventricular end-diastolic pressure.  2. Right ventricular systolic function is normal. The right ventricular  size is normal.  3. There is a large ill-defined echodensity in the left atrium, which is  suspicious for organized thrombus or less likely tumor.. Left atrial size  was moderately dilated.  4. The mitral valve is grossly normal. Trivial mitral valve  regurgitation.  5. The aortic valve is tricuspid. Aortic valve regurgitation is not  visualized. Mild aortic valve sclerosis is present, with no evidence of  aortic valve  stenosis.  6. The inferior vena cava is normal in size with <50% respiratory  variability, suggesting right atrial pressure of 8 mmHg.   Conclusion(s)/Recomendation(s): Large echogenic mass noted in the left  atrium, corresponding to filling defect in a recent CT angiogram. Suspect  this is organized thrombus or less likely tumor. Consider further imaging  with TEE and/or cMRI and cardiac  surgery consultation. )       Anesthesia Quick Evaluation

## 2020-07-11 NOTE — Progress Notes (Signed)
ANTICOAGULATION CONSULT NOTE - Follow Up Consult  Pharmacy Consult for heparin Indication: atrial fibrillation  Allergies  Allergen Reactions  . Penicillins Other (See Comments)    Childhood reaction.    Patient Measurements: Height: 5\' 3"  (160 cm) Weight: 73.1 kg (161 lb 2.5 oz) IBW/kg (Calculated) : 52.4  Vital Signs: Temp: 98.8 F (37.1 C) (01/31 0447) BP: 131/47 (01/31 0447) Pulse Rate: 58 (01/31 0447)  Labs: Recent Labs    07/09/20 0432 07/10/20 0417 07/11/20 0304  HGB 8.4* 8.7* 8.5*  HCT 27.3* 28.1* 25.6*  PLT 122* 124* 115*  APTT 69* 83*  --   LABPROT  --   --  14.8  INR  --   --  1.2  HEPARINUNFRC 0.73* 0.55 0.50  CREATININE 4.88* 5.00* 3.81*    Estimated Creatinine Clearance: 13.7 mL/min (A) (by C-G formula based on SCr of 3.81 mg/dL (H)).  Assessment: 70 yo female went to PCP for N/V and found to have "abnormal labs" and sent to ED, found to be w/ AKI (baseline Scr 1.2, now >6), to transition from Eliquis for Afib to UFH; last dose of Eliquis taken 1/20 10a.  New HD patient  Heparin level therapeutic  Goal of Therapy:  Heparin level 0.3-0.7 units/ml  Monitor platelets by anticoagulation protocol: Yes   Plan:  Continue heparin at 1000 units / hr Follow up am labs  Follow up for apixaban resumption  Thank you Anette Guarneri, PharmD 07/11/2020 8:47 AM

## 2020-07-12 ENCOUNTER — Encounter (HOSPITAL_COMMUNITY): Payer: Self-pay | Admitting: Vascular Surgery

## 2020-07-12 DIAGNOSIS — N179 Acute kidney failure, unspecified: Secondary | ICD-10-CM | POA: Diagnosis not present

## 2020-07-12 LAB — RENAL FUNCTION PANEL
Albumin: 2.1 g/dL — ABNORMAL LOW (ref 3.5–5.0)
Anion gap: 10 (ref 5–15)
BUN: 30 mg/dL — ABNORMAL HIGH (ref 8–23)
CO2: 25 mmol/L (ref 22–32)
Calcium: 8.4 mg/dL — ABNORMAL LOW (ref 8.9–10.3)
Chloride: 103 mmol/L (ref 98–111)
Creatinine, Ser: 4.69 mg/dL — ABNORMAL HIGH (ref 0.44–1.00)
GFR, Estimated: 10 mL/min — ABNORMAL LOW (ref >60)
Glucose, Bld: 124 mg/dL — ABNORMAL HIGH (ref 70–99)
Phosphorus: 4.1 mg/dL (ref 2.5–4.6)
Potassium: 4.2 mmol/L (ref 3.5–5.1)
Sodium: 138 mmol/L (ref 135–145)

## 2020-07-12 LAB — CBC
HCT: 27 % — ABNORMAL LOW (ref 36.0–46.0)
Hemoglobin: 8.5 g/dL — ABNORMAL LOW (ref 12.0–15.0)
MCH: 32.1 pg (ref 26.0–34.0)
MCHC: 31.5 g/dL (ref 30.0–36.0)
MCV: 101.9 fL — ABNORMAL HIGH (ref 80.0–100.0)
Platelets: 122 10*3/uL — ABNORMAL LOW (ref 150–400)
RBC: 2.65 MIL/uL — ABNORMAL LOW (ref 3.87–5.11)
RDW: 15.3 % (ref 11.5–15.5)
WBC: 7 10*3/uL (ref 4.0–10.5)
nRBC: 0 % (ref 0.0–0.2)

## 2020-07-12 LAB — GLUCOSE, CAPILLARY
Glucose-Capillary: 86 mg/dL (ref 70–99)
Glucose-Capillary: 172 mg/dL — ABNORMAL HIGH (ref 70–99)
Glucose-Capillary: 83 mg/dL (ref 70–99)
Glucose-Capillary: 120 mg/dL — ABNORMAL HIGH (ref 70–99)
Glucose-Capillary: 146 mg/dL — ABNORMAL HIGH (ref 70–99)
Glucose-Capillary: 139 mg/dL — ABNORMAL HIGH (ref 70–99)

## 2020-07-12 LAB — HEPARIN LEVEL (UNFRACTIONATED): Heparin Unfractionated: 1.04 IU/mL — ABNORMAL HIGH (ref 0.30–0.70)

## 2020-07-12 MED ORDER — HEPARIN SODIUM (PORCINE) 1000 UNIT/ML IJ SOLN
INTRAMUSCULAR | Status: AC
  Administered 2020-07-12: 1000 [IU]
  Filled 2020-07-12: qty 4

## 2020-07-12 MED ORDER — HEPARIN (PORCINE) 25000 UT/250ML-% IV SOLN
1000.0000 [IU]/h | INTRAVENOUS | Status: AC
  Administered 2020-07-12 – 2020-07-14 (×3): 1000 [IU]/h via INTRAVENOUS
  Filled 2020-07-12 (×3): qty 250

## 2020-07-12 MED ORDER — DARBEPOETIN ALFA 100 MCG/0.5ML IJ SOSY: 100.0000 ug | PREFILLED_SYRINGE | INTRAMUSCULAR | Status: DC

## 2020-07-12 MED ORDER — DARBEPOETIN ALFA 60 MCG/0.3ML IJ SOSY
PREFILLED_SYRINGE | INTRAMUSCULAR | Status: AC
  Filled 2020-07-12: qty 0.3

## 2020-07-12 MED ORDER — DARBEPOETIN ALFA 40 MCG/0.4ML IJ SOSY: 40.0000 ug | PREFILLED_SYRINGE | Freq: Once | INTRAMUSCULAR | Status: AC

## 2020-07-12 MED ORDER — DARBEPOETIN ALFA 40 MCG/0.4ML IJ SOSY
PREFILLED_SYRINGE | INTRAMUSCULAR | Status: AC
  Administered 2020-07-12: 40 ug
  Filled 2020-07-12: qty 0.4

## 2020-07-12 NOTE — Procedures (Signed)
Patient was seen on dialysis and the procedure was supervised.  BFR 300  Via TDC BP is  124/47.   Patient appears to be tolerating treatment well  Louis Meckel 07/12/2020

## 2020-07-12 NOTE — Progress Notes (Signed)
PT Cancellation Note  Patient Details Name: Kathryn Hancock MRN: [id number] DOB: [birth year]   Cancelled Treatment:    Reason Eval/Treat Not Completed: Other (comment) in HD in the morning and now sound asleep. Will attempt on next date of service.    Windell Norfolk, DPT, PN1   Supplemental Physical Therapist [facility]    Pager [phone] Acute Rehab Office [phone]

## 2020-07-12 NOTE — Progress Notes (Signed)
Pharmacy called and informed this nurse to hold heparin due to elevated heparin level. Upon assessment it was found that labs were drawn from the same right arm the heparin was infusing through. Patient's left arm has AV fistula and restricted. Pharmacist, Diane was informed about this and advised to restart heparin at the same rate as recollection of lab is not possible at this time.  Will continue to monitor.   Milagros Loll, RN.

## 2020-07-12 NOTE — Plan of Care (Signed)

## 2020-07-12 NOTE — Progress Notes (Signed)
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  During her prior hospital stay at [facility] patient had worsening renal function and lower extremity ischemic change and postoperative A. fib. 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 Was instructed to repeat and the repeat on showed worsening and was advised to come to the ER. Patient states over the last 2 weeks patient has been worsening nausea vomiting unable to keep anything but denies any abdominal pain or diarrhea.  Subjective / 24h Interval events: Seen and examined today in dialysis unit.  She has no complaints.  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  Nephrology consulted, now started on dialysis, underwent catheter placement on 1/26. -Vein mapping done.  Patient underwent left first stage brachiobasilic AV fistula by vascular surgery on 07/11/2020.  Dialysis per nephrology.  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  If she is worse she would need staged bilateral femoral to above-knee popliteal artery bypass graft.  Currently stable, vascular surgery following.  Appreciate help.  Nausea/vomiting -Could be secondary from uremia. CT renal studies shows no obstruction/hydronephrosis/pyelonephritis..   Symptoms resolved.  Hyperkalemia -In the setting of worsening kidney function.   -Resolved after Lokelma.  Managed with dialysis  Paroxysmal A. Fib -Rate controlled. -Continue Coreg and amiodarone.  Hold Eliquis in the setting of worsening kidney function.    She was on heparin preprocedure yesterday but she is not on anymore.  Will reconsult pharmacy to reinitiate her on heparin drip.  Hypertension -Stable.  Continue amlodipine and Coreg  Type 2 diabetes mellitus -Well controlled.  A1c 5.6% SSI discontinued due to hypoglycemia.  Anemia of chronic disease -Macrocytosis.    She is status post unit of packed red blood cells for hemoglobin of 6.6, currently improved and hemoglobin is stable at 8.7  Recent open heart surgery for left atrial myxoma at [facility]: -In December 2021.  Patient denies an chest pain or shortness of breath.  Continue to monitor -Continue aspirin, statin  History of abdominal aortic aneurysm  -Reviewed CT renal study showed unchanged from prior exam back to January 15/2021.  Tobacco abuse -smokes 1 pack of cigarettes per day-tells me that she is working on cessation.  Cuts back from 2 packs to 1 pack.   Scheduled Meds: . amiodarone  200 mg Oral Daily  . aspirin EC  81 mg Oral Daily  . atorvastatin  40 mg Oral q1800  . carvedilol  12.5 mg Oral BID WC  . Chlorhexidine Gluconate Cloth  6 each Topical Daily  . Chlorhexidine Gluconate Cloth  6 each Topical Q0600  . [START ON 07/19/2020] darbepoetin (ARANESP) injection - DIALYSIS  100 mcg Intravenous Q Tue-HD  . folic acid  6,606 mcg Oral Daily  . sodium chloride flush  3 mL Intravenous Q12H   Continuous Infusions: . sodium chloride    . sodium chloride    .  sodium chloride     PRN Meds:.sodium chloride, sodium chloride, sodium chloride, acetaminophen **OR** acetaminophen, alteplase, heparin, hydrALAZINE, labetalol, lidocaine (PF), lidocaine-prilocaine, ondansetron (ZOFRAN) IV, oxyCODONE,  pentafluoroprop-tetrafluoroeth, sodium chloride flush  Diet Orders (From admission, onward)    Start     Ordered   07/11/20 1306  Diet renal with fluid restriction Fluid restriction: 1200 mL Fluid; Room service appropriate? Yes; Fluid consistency: Thin  Diet effective now       Question Answer Comment  Fluid restriction: 1200 mL Fluid   Room service appropriate? Yes   Fluid consistency: Thin      07/11/20 1305          DVT prophylaxis:      Code Status: Full Code  Family Communication: none   Status is: Inpatient  Remains inpatient appropriate because:Inpatient level of care appropriate due to severity of illness   Dispo: The patient is from: Home              Anticipated d/c is to: Home              Anticipated d/c date is: 2 days              Patient currently is not medically stable to d/c.   Difficult to place patient No  Level of care: Telemetry Medical  Consultants:  Nephrology  Vascular surgery   Procedures:  Arteriogram   Microbiology  None   Antimicrobials: None     Objective: Vitals:   07/12/20 0639 07/12/20 0723 07/12/20 0757 07/12/20 0800  BP: (!) 122/43   (!) 120/45  Pulse: 61  61   Resp: 18   13  Temp: 98.9 F (37.2 C)  98.3 F (36.8 C)   TempSrc:   Oral   SpO2: 94%  92%   Weight:  72.6 kg    Height:        Intake/Output Summary (Last 24 hours) at 07/12/2020 1053 Last data filed at 07/12/2020 0700 Gross per 24 hour  Intake 1103.35 ml  Output 900 ml  Net 203.35 ml   Filed Weights   07/10/20 1817 07/10/20 2019 07/12/20 0723  Weight: 73 kg 73.1 kg 72.6 kg    Examination: General exam: Appears calm and comfortable  Respiratory system: Clear to auscultation. Respiratory effort normal. Cardiovascular system: S1 & S2 heard, RRR. No JVD, murmurs, rubs, gallops or clicks. No pedal edema. Gastrointestinal system: Abdomen is nondistended, soft and nontender. No organomegaly or masses felt. Normal bowel sounds heard. Central nervous  system: Alert and oriented. No focal neurological deficits. Extremities: Symmetric 5 x 5 power. Skin: Multiple necrotic toes as pictured below Psychiatry: Judgement and insight appear normal. Mood & affect appropriate.       Data Reviewed: I have independently reviewed following labs and imaging studies   CBC: Recent Labs  Lab 07/08/20 0500 07/09/20 0432 07/10/20 0417 07/11/20 0304 07/12/20 0809  WBC 6.5 7.2 7.2 7.3 7.0  HGB 6.6* 8.4* 8.7* 8.5* 8.5*  HCT 20.2* 27.3* 28.1* 25.6* 27.0*  MCV 100.5* 100.7* 101.4* 99.6 101.9*  PLT 123* 122* 124* 115* 914*   Basic Metabolic Panel: Recent Labs  Lab 07/07/20 1420 07/08/20 0500 07/09/20 0432 07/10/20 0417 07/11/20 0304 07/12/20 0809  NA 137 140 138 139 137 138  K 4.1 3.7 4.1 4.1 4.0 4.2  CL 103 103 104 106 103 103  CO2 23 25 23 22 25 25   GLUCOSE 143* 85 103* 86 119* 124*  BUN 65* 37* 40* 38* 24* 30*  CREATININE 6.17* 4.45* 4.88* 5.00* 3.81* 4.69*  CALCIUM 8.3* 8.3* 8.4* 8.6* 8.2* 8.4*  PHOS 5.1*  --  4.2 4.5  --  4.1   Liver Function Tests: Recent Labs  Lab 07/07/20 1420 07/09/20 0432 07/10/20 0417 07/12/20 0809  ALBUMIN 2.2* 2.2* 2.1* 2.1*   Coagulation Profile: Recent Labs  Lab 07/11/20 0304  INR 1.2   HbA1C: No results for input(s): HGBA1C in the last 72 hours. CBG: Recent Labs  Lab 07/11/20 1248 07/11/20 1334 07/11/20 1710 07/11/20 2008 07/12/20 0709  GLUCAP 100* 86 172* 115* 83    No results found for this or any previous visit (from the past 240 hour(s)).   Radiology Studies: No results found. Darliss Cheney, MD [facility]  Between 7 am - 7 pm I am available, please contact me via Amion or [place]  Between 7 pm - 7 am I am not available, please contact night coverage MD/APP via Amion

## 2020-07-12 NOTE — Progress Notes (Addendum)
ANTICOAGULATION CONSULT NOTE - Follow Up Consult  Pharmacy Consult for IV Heparin Indication: atrial fibrillation  Allergies  Allergen Reactions  . Penicillins Other (See Comments)    Childhood reaction.    Patient Measurements: Height: 5\' 3"  (160 cm) Weight: 71.5 kg (157 lb 10.1 oz) IBW/kg (Calculated) : 52.4  Heparin Dosing Weight: 67.3 kg  Vital Signs: Temp: 98.6 F (37 C) (02/01 2155) Temp Source: Oral (02/01 1704) BP: 123/50 (02/01 2155) Pulse Rate: 60 (02/01 2155)  Labs: Recent Labs    07/10/20 0417 07/11/20 0304 07/12/20 0809 07/12/20 2048  HGB 8.7* 8.5* 8.5*  --   HCT 28.1* 25.6* 27.0*  --   PLT 124* 115* 122*  --   APTT 83*  --   --   --   LABPROT  --  14.8  --   --   INR  --  1.2  --   --   HEPARINUNFRC 0.55 0.50  --  1.04*  CREATININE 5.00* 3.81* 4.69*  --     Estimated Creatinine Clearance: 11 mL/min (A) (by C-G formula based on SCr of 4.69 mg/dL (H)).  Assessment: 70 yr old female went to PCP for N/V and found to have "abnormal labs" and sent to ED, found to be in AKI on CKD, now dialysis dependent, to transition from apixaban for Afib to IV heparin (last apixaban taken 1/20 at 1000 AM).  Pt is S/P AVF placement 1/31, ok to resume heparin today, per VVS.   Heparin level ~6 hrs after heparin infusion was resumed at 1000 units/hr was 1.04 units/ml, which is above the goal range for this pt. CBC stable. Per RN, no bleeding observed. RN spoke with pt, and pt told her that the heparin level was drawn from the same arm in which the heparin was infusing; pt's other arm has AVF and is not available for blood drawing. I spoke with Dr Donnetta Hutching, on call provider for [facility], and he suggested trying to draw heparin level from R IJ [facility] after flushing it well. If this does not work and the heparin level drawn from [facility] is still high, Dr. Donnetta Hutching said to keep pt on current heparin infusion rate (1000 units/hr) since she had been therapeutic on this rate before the AVF was  placed and not check heparin levels (monitor for bleeding). Pt is not ready to transition back to apixaban because other [facility] procedures may be done this admission.  I spoke with Dr. Royce Macadamia, on call nephrologist, and she did not feel comfortable drawing heparin levels from R IJ TDC. I will notify primary team tonight re: inability to check heparin levels in this pt at this time.  Goal of Therapy:  Heparin level 0.3-0.7 units/ml  Monitor platelets by anticoagulation protocol: Yes   Plan:  Continue heparin at 1000 units/hr (pt had multiple therapeutic heparin levels on this infusion rate) Monitor pt closely for signs/symptoms of bleeding F/U with [facility] and primary team in the AM re: possible placement of alternative IV site for heparin infusion  Kathryn Hancock, PharmD, BCPS, [facility] Clinical Pharmacist 07/12/2020 10:01 PM   ADDENDUM (07/13/20 at 1417 PM) Notified Dr. Cyd Silence last night via secure chat re: inability to draw heparin levels at this point, due to access issues. He replied that he would notify the primary team.   Pt scheduled for HD on Thursday, 07/14/20; hope to be able to get heparin level drawn while pt in HD.  Per RN, no issues with heparin IV or bleeding  observed.

## 2020-07-12 NOTE — Progress Notes (Addendum)
Progress Note    07/12/2020 7:53 AM 1 Day Post-Op  Subjective:  Denies hand pain or numbness. Incision soreness   Vitals:   07/11/20 2008 07/12/20 0639  BP: (!) 119/50 (!) 122/43  Pulse: 62 61  Resp: 19 18  Temp: 98.3 F (36.8 C) 98.9 F (37.2 C)  SpO2: 95% 94%    Physical Exam: General appearance: Awake, alert in no apparent distress Cardiac: Heart rate and rhythm are regular Respirations: Nonlabored Extremities: Right hand is warm and well perfused with 5/5 hand grip strength. Mild edema of left UE and ecchymosis at incision. Good bruit and thrill in fistula    CBC    Component Value Date/Time   WBC 7.3 07/11/2020 0304   RBC 2.57 (L) 07/11/2020 0304   HGB 8.5 (L) 07/11/2020 0304   HGB 9.8 (L) 05/26/2020 1158   HCT 25.6 (L) 07/11/2020 0304   HCT 29.6 (L) 05/26/2020 1158   PLT 115 (L) 07/11/2020 0304   PLT 286 05/26/2020 1158   MCV 99.6 07/11/2020 0304   MCV 100 (H) 05/26/2020 1158   MCH 33.1 07/11/2020 0304   MCHC 33.2 07/11/2020 0304   RDW 15.7 (H) 07/11/2020 0304   RDW 14.1 05/26/2020 1158    BMET    Component Value Date/Time   NA 137 07/11/2020 0304   NA 139 05/26/2020 1158   K 4.0 07/11/2020 0304   CL 103 07/11/2020 0304   CO2 25 07/11/2020 0304   GLUCOSE 119 (H) 07/11/2020 0304   BUN 24 (H) 07/11/2020 0304   BUN 75 (H) 05/26/2020 1158   CREATININE 3.81 (H) 07/11/2020 0304   CALCIUM 8.2 (L) 07/11/2020 0304   GFRNONAA 12 (L) 07/11/2020 0304   GFRAA 16 (L) 05/26/2020 1158     Intake/Output Summary (Last 24 hours) at 07/12/2020 0753 Last data filed at 07/12/2020 0601 Gross per 24 hour  Intake 1103.35 ml  Output 900 ml  Net 203.35 ml    HOSPITAL MEDICATIONS Scheduled Meds: . amiodarone  200 mg Oral Daily  . aspirin EC  81 mg Oral Daily  . atorvastatin  40 mg Oral q1800  . carvedilol  12.5 mg Oral BID WC  . Chlorhexidine Gluconate Cloth  6 each Topical Daily  . Chlorhexidine Gluconate Cloth  6 each Topical Q0600  . darbepoetin (ARANESP)  injection - DIALYSIS  60 mcg Intravenous Q Tue-HD  . folic acid  6,378 mcg Oral Daily  . sodium bicarbonate  1,300 mg Oral TID  . sodium chloride flush  3 mL Intravenous Q12H   Continuous Infusions: . sodium chloride    . sodium chloride    . sodium chloride     PRN Meds:.sodium chloride, sodium chloride, sodium chloride, acetaminophen **OR** acetaminophen, alteplase, heparin, hydrALAZINE, labetalol, lidocaine (PF), lidocaine-prilocaine, ondansetron (ZOFRAN) IV, oxyCODONE, pentafluoroprop-tetrafluoroeth, sodium chloride flush  Assessment and Plan: POD 1 left 1st stage BVT.  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 Plan semi-elective staged bypass fem-pop bypass if wounds worsen.   Risa Grill, PA-C [facility] [phone] 07/12/2020  7:53 AM   I have seen and evaluated the patient. I agree with the PA note as documented above.  Postop day 1 status post left first stage basilic vein fistula.  Has a good thrill in the fistula and a palpable pulse at the wrist.  No signs of steal.  Would be okay to resume heparin today but would do this without bolus.  [name], MD [facility] [place] Office: [phone]

## 2020-07-12 NOTE — Progress Notes (Signed)
ANTICOAGULATION CONSULT NOTE - Follow Up Consult  Pharmacy Consult for heparin Indication: atrial fibrillation  Allergies  Allergen Reactions  . Penicillins Other (See Comments)    Childhood reaction.    Patient Measurements: Height: 5\' 3"  (160 cm) Weight: 73.1 kg (161 lb 2.5 oz) IBW/kg (Calculated) : 52.4  Vital Signs: Temp: 98.9 F (37.2 C) (02/01 0639) BP: 122/43 (02/01 0639) Pulse Rate: 61 (02/01 0639)  Labs: Recent Labs    07/10/20 0417 07/11/20 0304  HGB 8.7* 8.5*  HCT 28.1* 25.6*  PLT 124* 115*  APTT 83*  --   LABPROT  --  14.8  INR  --  1.2  HEPARINUNFRC 0.55 0.50  CREATININE 5.00* 3.81*    Estimated Creatinine Clearance: 13.7 mL/min (A) (by C-G formula based on SCr of 3.81 mg/dL (H)).  Assessment: 70 yo female went to PCP for N/V and found to have "abnormal labs" and sent to ED, found to be w/ AKI (baseline Scr 1.2, now >6), to transition from Eliquis for Afib to UFH; last dose of Eliquis taken 1/20 10a.  New HD patient, sp AVF placement 1/31, ok to resume heparin today per vascular  Goal of Therapy:  Heparin level 0.3-0.7 units/ml  Monitor platelets by anticoagulation protocol: Yes   Plan:  Heparin at 1000 units / hr 8 hour heparin level Daily heparin level, CBC  Follow up for apixaban resumption?  Thank you Anette Guarneri, PharmD 07/12/2020 8:42 AM

## 2020-07-12 NOTE — Progress Notes (Signed)
Patient ID: Kathryn Hancock, female   DOB: 06/12/1950, 70 y.o.   MRN: [id number]    S: Seen on HD-  Hemodynamically stable - she notes positive effects from the dialysis    O:BP (!) 120/45   Pulse 61   Temp 98.3 F (36.8 C) (Oral)   Resp 13   Ht 5\' 3"  (1.6 m)   Wt 72.6 kg   SpO2 92%   BMI 28.35 kg/m   Intake/Output Summary (Last 24 hours) at 07/12/2020 0957 Last data filed at 07/12/2020 0700 Gross per 24 hour  Intake 1103.35 ml  Output 900 ml  Net 203.35 ml   Intake/Output: I/O last 3 completed shifts: In: 1103.4 [P.O.:480; I.V.:623.4] Out: 900 [Urine:900]  Intake/Output this shift:  No intake/output data recorded. Weight change:  Gen: NAD CVS: rrr, no rub Resp: cta Abd: benign Ext: no edema, ischemic toes bilaterally, LUE BVT +T/B Dialysis access:  [facility]  Recent Labs  Lab 07/06/20 0026 07/07/20 1420 07/08/20 0500 07/09/20 0432 07/10/20 0417 07/11/20 0304 07/12/20 0809  NA 138 137 140 138 139 137 138  K 4.5 4.1 3.7 4.1 4.1 4.0 4.2  CL 102 103 103 104 106 103 103  CO2 24 23 25 23 22 25 25   GLUCOSE 98 143* 85 103* 86 119* 124*  BUN 68* 65* 37* 40* 38* 24* 30*  CREATININE 6.04* 6.17* 4.45* 4.88* 5.00* 3.81* 4.69*  ALBUMIN  --  2.2*  --  2.2* 2.1*  --  2.1*  CALCIUM 8.8* 8.3* 8.3* 8.4* 8.6* 8.2* 8.4*  PHOS  --  5.1*  --  4.2 4.5  --  4.1   Liver Function Tests: Recent Labs  Lab 07/09/20 0432 07/10/20 0417 07/12/20 0809  ALBUMIN 2.2* 2.1* 2.1*   No results for input(s): LIPASE, AMYLASE in the last 168 hours. No results for input(s): AMMONIA in the last 168 hours. CBC: Recent Labs  Lab 07/08/20 0500 07/09/20 0432 07/10/20 0417 07/11/20 0304 07/12/20 0809  WBC 6.5 7.2 7.2 7.3 7.0  HGB 6.6* 8.4* 8.7* 8.5* 8.5*  HCT 20.2* 27.3* 28.1* 25.6* 27.0*  MCV 100.5* 100.7* 101.4* 99.6 101.9*  PLT 123* 122* 124* 115* 122*   Cardiac Enzymes: No results for input(s): CKTOTAL, CKMB, CKMBINDEX, TROPONINI in the last 168 hours. CBG: Recent Labs  Lab  07/11/20 1248 07/11/20 1334 07/11/20 1710 07/11/20 2008 07/12/20 0709  GLUCAP 100* 86 172* 115* 83    Iron Studies: No results for input(s): IRON, TIBC, TRANSFERRIN, FERRITIN in the last 72 hours. Studies/Results: No results found. Marland Kitchen amiodarone  200 mg Oral Daily  . aspirin EC  81 mg Oral Daily  . atorvastatin  40 mg Oral q1800  . carvedilol  12.5 mg Oral BID WC  . Chlorhexidine Gluconate Cloth  6 each Topical Daily  . Chlorhexidine Gluconate Cloth  6 each Topical Q0600  . Darbepoetin Alfa      . darbepoetin (ARANESP) injection - DIALYSIS  60 mcg Intravenous Q Tue-HD  . folic acid  4,166 mcg Oral Daily  . heparin sodium (porcine)      . sodium bicarbonate  1,300 mg Oral TID  . sodium chloride flush  3 mL Intravenous Q12H    BMET    Component Value Date/Time   NA 138 07/12/2020 0809   NA 139 05/26/2020 1158   K 4.2 07/12/2020 0809   CL 103 07/12/2020 0809   CO2 25 07/12/2020 0809   GLUCOSE 124 (H) 07/12/2020 0809   BUN 30 (H) 07/12/2020 0809  BUN 75 (H) 05/26/2020 1158   CREATININE 4.69 (H) 07/12/2020 0809   CALCIUM 8.4 (L) 07/12/2020 0809   GFRNONAA 10 (L) 07/12/2020 0809   GFRAA 16 (L) 05/26/2020 1158   CBC    Component Value Date/Time   WBC 7.0 07/12/2020 0809   RBC 2.65 (L) 07/12/2020 0809   HGB 8.5 (L) 07/12/2020 0809   HGB 9.8 (L) 05/26/2020 1158   HCT 27.0 (L) 07/12/2020 0809   HCT 29.6 (L) 05/26/2020 1158   PLT 122 (L) 07/12/2020 0809   PLT 286 05/26/2020 1158   MCV 101.9 (H) 07/12/2020 0809   MCV 100 (H) 05/26/2020 1158   MCH 32.1 07/12/2020 0809   MCHC 31.5 07/12/2020 0809   RDW 15.3 07/12/2020 0809   RDW 14.1 05/26/2020 1158     Assessment/Plan:  1. 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  Started on HD 1/27 and had second session on 1/30. 3rdHD today and will ask renal navigator to start setting up outpatient HD spot in [place]. Nex HD tentatively planned for Thurs 2. Vascular  access:  RIJ TDC placed 07/07/20, LUE BVT created 07/11/20 3. Anemia of ESRD- s/p transfusion and now on ESA- will inc dose and check iron stores 4. HTN- stable 5. A fib on heparin gtt 6. Bones- will check PTH, phos OK no binder  7. Bilateral PAD with gangrenous changes.  Per primary and [facility]VS following and may need bypass surgery. 8. Disposition- will need outpatient HD to be arranged. Not sure how close she is to discharge on other fronts   [place] [phone]

## 2020-07-13 DIAGNOSIS — N179 Acute kidney failure, unspecified: Secondary | ICD-10-CM | POA: Diagnosis not present

## 2020-07-13 LAB — CBC
HCT: 25.2 % — ABNORMAL LOW (ref 36.0–46.0)
Hemoglobin: 8.3 g/dL — ABNORMAL LOW (ref 12.0–15.0)
MCH: 32.5 pg (ref 26.0–34.0)
MCHC: 32.9 g/dL (ref 30.0–36.0)
MCV: 98.8 fL (ref 80.0–100.0)
Platelets: 100 10*3/uL — ABNORMAL LOW (ref 150–400)
RBC: 2.55 MIL/uL — ABNORMAL LOW (ref 3.87–5.11)
RDW: 15.3 % (ref 11.5–15.5)
WBC: 6.2 10*3/uL (ref 4.0–10.5)
nRBC: 0 % (ref 0.0–0.2)

## 2020-07-13 LAB — GLUCOSE, CAPILLARY
Glucose-Capillary: 97 mg/dL (ref 70–99)
Glucose-Capillary: 114 mg/dL — ABNORMAL HIGH (ref 70–99)
Glucose-Capillary: 85 mg/dL (ref 70–99)

## 2020-07-13 LAB — IRON AND TIBC
Iron: 37 ug/dL (ref 28–170)
Saturation Ratios: 21 % (ref 10.4–31.8)
TIBC: 176 ug/dL — ABNORMAL LOW (ref 250–450)
UIBC: 139 ug/dL

## 2020-07-13 LAB — FERRITIN: Ferritin: 182 ng/mL (ref 11–307)

## 2020-07-13 MED ORDER — SODIUM CHLORIDE 0.9 % IV SOLN
125.0000 mg | Freq: Every day | INTRAVENOUS | Status: DC
  Administered 2020-07-13 – 2020-07-15 (×3): 125 mg via INTRAVENOUS
  Filled 2020-07-13 (×3): qty 10

## 2020-07-13 MED ORDER — CHLORHEXIDINE GLUCONATE CLOTH 2 % EX PADS
6.0000 | MEDICATED_PAD | Freq: Every day | CUTANEOUS | Status: DC
  Administered 2020-07-14 – 2020-07-15 (×2): 6 via TOPICAL

## 2020-07-13 NOTE — Plan of Care (Signed)
  Problem: Education: Goal: Knowledge of General Education information will improve Description Including pain rating scale, medication(s)/side effects and non-pharmacologic comfort measures Outcome: Progressing   

## 2020-07-13 NOTE — Progress Notes (Signed)
  Progress Note    07/13/2020 9:10 AM 2 Days Post-Op  Subjective:  Says toes are hurting more this morning than yesterday. Says her legs are " jumpy"   Vitals:   07/13/20 0516 07/13/20 0803  BP: (!) 134/55   Pulse: (!) 58 (!) 58  Resp: 18   Temp: 98.4 F (36.9 C)   SpO2: 94%    Physical Exam: Cardiac:  Regular rate and rhythm. Has medial sternotomy incision with staples. Healing well Lungs:  Non labored Incisions:  Left AC incision intact. Ecchymosis surrounding. Soft. Left hand motor and sensation intact. 5/5 grip strength. Edema of left forearm and arm.  Extremities:  Well perfused and warm. Bilateral dry gangrene of toes. Motor and sensation intact. Wounds appear stable Neurologic: Alert and oriented  CBC    Component Value Date/Time   WBC 6.2 07/13/2020 0159   RBC 2.55 (L) 07/13/2020 0159   HGB 8.3 (L) 07/13/2020 0159   HGB 9.8 (L) 05/26/2020 1158   HCT 25.2 (L) 07/13/2020 0159   HCT 29.6 (L) 05/26/2020 1158   PLT 100 (L) 07/13/2020 0159   PLT 286 05/26/2020 1158   MCV 98.8 07/13/2020 0159   MCV 100 (H) 05/26/2020 1158   MCH 32.5 07/13/2020 0159   MCHC 32.9 07/13/2020 0159   RDW 15.3 07/13/2020 0159   RDW 14.1 05/26/2020 1158    BMET    Component Value Date/Time   NA 138 07/12/2020 0809   NA 139 05/26/2020 1158   K 4.2 07/12/2020 0809   CL 103 07/12/2020 0809   CO2 25 07/12/2020 0809   GLUCOSE 124 (H) 07/12/2020 0809   BUN 30 (H) 07/12/2020 0809   BUN 75 (H) 05/26/2020 1158   CREATININE 4.69 (H) 07/12/2020 0809   CALCIUM 8.4 (L) 07/12/2020 0809   GFRNONAA 10 (L) 07/12/2020 0809   GFRAA 16 (L) 05/26/2020 1158    INR    Component Value Date/Time   INR 1.2 07/11/2020 0304     Intake/Output Summary (Last 24 hours) at 07/13/2020 0910 Last data filed at 07/13/2020 0600 Gross per 24 hour  Intake 935.22 ml  Output 1350 ml  Net -414.78 ml     Assessment/Plan:  70 y.o. female is s/p left 1st stage BVT  2 Days Post-Op. Good thrill in fistula. No steal  symptoms. Palpable radial pulse. Encourage elevation of LUE.  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 Presently wounds remain stable. Will continue to follow   DVT prophylaxis:  Heparin IV   Karoline Caldwell, PA-C [facility] [phone] 07/13/2020 9:10 AM

## 2020-07-13 NOTE — Progress Notes (Signed)
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  During her prior hospital stay at [facility] patient had worsening renal function and lower extremity ischemic change and postoperative A. fib. 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 Was instructed to repeat and the repeat on showed worsening and was advised to come to the ER. Patient states over the last 2 weeks patient has been worsening nausea vomiting unable to keep anything but denies any abdominal pain or diarrhea.  Subjective / 24h Interval events: Seen and examined.  Complains of more pain in the feet/ischemic toes.  No other complaint.  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  Nephrology consulted, now started on dialysis, underwent catheter placement on 1/26. -Vein mapping done.  Patient underwent left first stage brachiobasilic AV fistula by vascular surgery on 07/11/2020.  Dialysis per nephrology.  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  If she is worse she would need staged bilateral femoral to above-knee popliteal artery bypass graft.  Currently stable, vascular surgery following.  Appreciate help.  Nausea/vomiting -Could be secondary from uremia. CT renal studies shows no  obstruction/hydronephrosis/pyelonephritis..  Symptoms resolved.  Hyperkalemia -In the setting of worsening kidney function.   -Resolved after Lokelma.  Managed with dialysis  Paroxysmal A. Fib -Rate controlled. -Continue Coreg and amiodarone.  Hold Eliquis in the setting of worsening kidney function.  Continue on heparin until fully cleared from vascular perspective and no further plans of procedure.  Hypertension -Stable.  Continue amlodipine and Coreg  Type 2 diabetes mellitus -Well controlled.  A1c 5.6% SSI discontinued due to hypoglycemia.  Will discontinue CBG checking as well.  Anemia of chronic disease -Macrocytosis.    She is status post unit of packed red blood cells for hemoglobin of 6.6, currently improved and hemoglobin is stable at 8.7  Recent open heart surgery for left atrial myxoma at [facility]: -In December 2021.  Patient denies an chest pain or shortness of breath.  Continue to monitor -Continue aspirin, statin  History of abdominal aortic aneurysm  -Reviewed CT renal study showed unchanged from prior exam back to January 15/2021.  Tobacco abuse -smokes 1 pack of cigarettes per day-tells me that she is working on cessation.  Cuts back from 2 packs to 1 pack.   Scheduled Meds: . amiodarone  200 mg Oral Daily  . aspirin EC  81 mg Oral Daily  . atorvastatin  40 mg Oral q1800  . carvedilol  12.5 mg Oral BID WC  . Chlorhexidine Gluconate Cloth  6 each Topical Daily  . Chlorhexidine Gluconate Cloth  6 each Topical Q0600  . [START ON 07/19/2020] darbepoetin (ARANESP) injection - DIALYSIS  100 mcg Intravenous Q Tue-HD  . folic acid  3,557 mcg Oral Daily  . sodium chloride flush  3 mL Intravenous Q12H   Continuous Infusions: . sodium chloride    . sodium chloride    .  sodium chloride    . ferric gluconate (FERRLECIT/NULECIT) IV    . heparin 1,000 Units/hr (07/12/20 2219)   PRN Meds:.sodium chloride, sodium chloride, sodium chloride, acetaminophen  **OR** acetaminophen, alteplase, heparin, hydrALAZINE, labetalol, lidocaine (PF), lidocaine-prilocaine, ondansetron (ZOFRAN) IV, oxyCODONE, pentafluoroprop-tetrafluoroeth, sodium chloride flush  Diet Orders (From admission, onward)    Start     Ordered   07/11/20 1306  Diet renal with fluid restriction Fluid restriction: 1200 mL Fluid; Room service appropriate? Yes; Fluid consistency: Thin  Diet effective now       Question Answer Comment  Fluid restriction: 1200 mL Fluid   Room service appropriate? Yes   Fluid consistency: Thin      07/11/20 1305          DVT prophylaxis:      Code Status: Full Code  Family Communication: none   Status is: Inpatient  Remains inpatient appropriate because:Inpatient level of care appropriate due to severity of illness   Dispo: The patient is from: Home              Anticipated d/c is to: Home              Anticipated d/c date is: 2 days              Patient currently is not medically stable to d/c.   Difficult to place patient No  Level of care: Telemetry Medical  Consultants:  Nephrology  Vascular surgery   Procedures:  Arteriogram   Microbiology  None   Antimicrobials: None     Objective: Vitals:   07/12/20 2155 07/13/20 0516 07/13/20 0803 07/13/20 1042  BP: (!) 123/50 (!) 134/55  (!) 130/54  Pulse: 60 (!) 58 (!) 58 (!) 59  Resp: 18 18  16   Temp: 98.6 F (37 C) 98.4 F (36.9 C)  98.6 F (37 C)  TempSrc:      SpO2: 90% 94%  96%  Weight: 71.5 kg     Height:        Intake/Output Summary (Last 24 hours) at 07/13/2020 1410 Last data filed at 07/13/2020 1300 Gross per 24 hour  Intake 1052.22 ml  Output 0 ml  Net 1052.22 ml   Filed Weights   07/12/20 0723 07/12/20 1035 07/12/20 2155  Weight: 72.6 kg 71.5 kg 71.5 kg    Examination: General exam: Appears calm and comfortable  Respiratory system: Clear to auscultation. Respiratory effort normal. Cardiovascular system: S1 & S2 heard, RRR. No JVD, murmurs, rubs,  gallops or clicks. No pedal edema. Gastrointestinal system: Abdomen is nondistended, soft and nontender. No organomegaly or masses felt. Normal bowel sounds heard. Central nervous system: Alert and oriented. No focal neurological deficits. Extremities: Symmetric 5 x 5 power. Skin: Ischemic toes as below Psychiatry: Judgement and insight appear normal. Mood & affect appropriate.        Data Reviewed: I have independently reviewed following labs and imaging studies   CBC: Recent Labs  Lab 07/09/20 0432 07/10/20 0417 07/11/20 0304 07/12/20 0809 07/13/20 0159  WBC 7.2 7.2 7.3 7.0 6.2  HGB 8.4* 8.7* 8.5* 8.5* 8.3*  HCT 27.3* 28.1* 25.6* 27.0* 25.2*  MCV 100.7* 101.4* 99.6 101.9* 98.8  PLT 122* 124* 115* 122* 277*   Basic Metabolic Panel: Recent Labs  Lab 07/07/20 1420 07/08/20 0500 07/09/20 0432 07/10/20 0417 07/11/20 0304 07/12/20 0809  NA 137 140 138 139 137 138  K 4.1 3.7 4.1 4.1 4.0 4.2  CL 103 103 104 106 103 103  CO2 23  25 23 22 25 25   GLUCOSE 143* 85 103* 86 119* 124*  BUN 65* 37* 40* 38* 24* 30*  CREATININE 6.17* 4.45* 4.88* 5.00* 3.81* 4.69*  CALCIUM 8.3* 8.3* 8.4* 8.6* 8.2* 8.4*  PHOS 5.1*  --  4.2 4.5  --  4.1   Liver Function Tests: Recent Labs  Lab 07/07/20 1420 07/09/20 0432 07/10/20 0417 07/12/20 0809  ALBUMIN 2.2* 2.2* 2.1* 2.1*   Coagulation Profile: Recent Labs  Lab 07/11/20 0304  INR 1.2   HbA1C: No results for input(s): HGBA1C in the last 72 hours. CBG: Recent Labs  Lab 07/12/20 1128 07/12/20 1628 07/12/20 2156 07/13/20 0645 07/13/20 1113  GLUCAP 120* 146* 139* 97 114*    No results found for this or any previous visit (from the past 240 hour(s)).   Radiology Studies: No results found. Darliss Cheney, MD [facility]  Between 7 am - 7 pm I am available, please contact me via Amion or [place]  Between 7 pm - 7 am I am not available, please contact night coverage MD/APP via Amion

## 2020-07-13 NOTE — Progress Notes (Signed)
Patient has been accepted at [facility] for outpatient HD on a TTS schedule with a seat time of 7:30am. She needs to arrive to her appointments at 7:10am.  Patient can start in the clinic on Saturday, 07/16/20 as long as she is discharged with enough time to go to the clinic by Friday afternoon 07/15/20 to sign intake paperwork. Otherwise, she will need to start on Tuesday, 07/19/20.  Navigator has updated Nephrologist and Attending and called patient's daughter/Katheryn Owens Shark with seat schedule and tentative clinic start plan. She is understanding and appreciative.  Navigator will continue to follow through discharge to assist with smooth transition from hospital to outpatient clinic start.   Alphonzo Cruise, [place] Renal Navigator [phone]

## 2020-07-13 NOTE — Progress Notes (Signed)
Patient ID: Kathryn Hancock, female   DOB: Feb 26, 1951, 70 y.o.   MRN: [id number]    S: HD yest- removed 1000- really no c/o's-  Has been walking with PT   O:BP (!) 130/54   Pulse (!) 59   Temp 98.6 F (37 C)   Resp 16   Ht 5\' 3"  (1.6 m)   Wt 71.5 kg   SpO2 96%   BMI 27.92 kg/m   Intake/Output Summary (Last 24 hours) at 07/13/2020 1300 Last data filed at 07/13/2020 0900 Gross per 24 hour  Intake 1052.22 ml  Output 350 ml  Net 702.22 ml   Intake/Output: I/O last 3 completed shifts: In: 935.2 [P.O.:780; I.V.:155.2] Out: 2250 [Urine:1250; Other:1000]  Intake/Output this shift:  Total I/O In: 120 [P.O.:120] Out: -  Weight change:  Gen: NAD CVS: rrr, no rub Resp: cta Abd: benign Ext: no edema, ischemic toes bilaterally, LUE BVT +T/B- placed 1/31 Dialysis access:  [facility]  Recent Labs  Lab 07/07/20 1420 07/08/20 0500 07/09/20 0432 07/10/20 0417 07/11/20 0304 07/12/20 0809  NA 137 140 138 139 137 138  K 4.1 3.7 4.1 4.1 4.0 4.2  CL 103 103 104 106 103 103  CO2 23 25 23 22 25 25   GLUCOSE 143* 85 103* 86 119* 124*  BUN 65* 37* 40* 38* 24* 30*  CREATININE 6.17* 4.45* 4.88* 5.00* 3.81* 4.69*  ALBUMIN 2.2*  --  2.2* 2.1*  --  2.1*  CALCIUM 8.3* 8.3* 8.4* 8.6* 8.2* 8.4*  PHOS 5.1*  --  4.2 4.5  --  4.1   Liver Function Tests: Recent Labs  Lab 07/09/20 0432 07/10/20 0417 07/12/20 0809  ALBUMIN 2.2* 2.1* 2.1*   No results for input(s): LIPASE, AMYLASE in the last 168 hours. No results for input(s): AMMONIA in the last 168 hours. CBC: Recent Labs  Lab 07/09/20 0432 07/10/20 0417 07/11/20 0304 07/12/20 0809 07/13/20 0159  WBC 7.2 7.2 7.3 7.0 6.2  HGB 8.4* 8.7* 8.5* 8.5* 8.3*  HCT 27.3* 28.1* 25.6* 27.0* 25.2*  MCV 100.7* 101.4* 99.6 101.9* 98.8  PLT 122* 124* 115* 122* 100*   Cardiac Enzymes: No results for input(s): CKTOTAL, CKMB, CKMBINDEX, TROPONINI in the last 168 hours. CBG: Recent Labs  Lab 07/12/20 1128 07/12/20 1628 07/12/20 2156 07/13/20 0645  07/13/20 1113  GLUCAP 120* 146* 139* 97 114*    Iron Studies:  Recent Labs    07/13/20 0159  IRON 37  TIBC 176*  FERRITIN 182   Studies/Results: No results found. Marland Kitchen amiodarone  200 mg Oral Daily  . aspirin EC  81 mg Oral Daily  . atorvastatin  40 mg Oral q1800  . carvedilol  12.5 mg Oral BID WC  . Chlorhexidine Gluconate Cloth  6 each Topical Daily  . Chlorhexidine Gluconate Cloth  6 each Topical Q0600  . [START ON 07/19/2020] darbepoetin (ARANESP) injection - DIALYSIS  100 mcg Intravenous Q Tue-HD  . folic acid  1,696 mcg Oral Daily  . sodium chloride flush  3 mL Intravenous Q12H    BMET    Component Value Date/Time   NA 138 07/12/2020 0809   NA 139 05/26/2020 1158   K 4.2 07/12/2020 0809   CL 103 07/12/2020 0809   CO2 25 07/12/2020 0809   GLUCOSE 124 (H) 07/12/2020 0809   BUN 30 (H) 07/12/2020 0809   BUN 75 (H) 05/26/2020 1158   CREATININE 4.69 (H) 07/12/2020 0809   CALCIUM 8.4 (L) 07/12/2020 0809   GFRNONAA 10 (L) 07/12/2020 0809  GFRAA 16 (L) 05/26/2020 1158   CBC    Component Value Date/Time   WBC 6.2 07/13/2020 0159   RBC 2.55 (L) 07/13/2020 0159   HGB 8.3 (L) 07/13/2020 0159   HGB 9.8 (L) 05/26/2020 1158   HCT 25.2 (L) 07/13/2020 0159   HCT 29.6 (L) 05/26/2020 1158   PLT 100 (L) 07/13/2020 0159   PLT 286 05/26/2020 1158   MCV 98.8 07/13/2020 0159   MCV 100 (H) 05/26/2020 1158   MCH 32.5 07/13/2020 0159   MCHC 32.9 07/13/2020 0159   RDW 15.3 07/13/2020 0159   RDW 14.1 05/26/2020 1158     Assessment/Plan:  1. 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  Started on HD 1/27 and had second session on 1/30. 3rd HD 2/1 -  will ask renal navigator to start setting up outpatient HD spot in [place]. Nex HD tentatively planned for Thurs 2. Vascular access:  RIJ TDC placed 07/07/20, LUE BVT created 07/11/20 3. Anemia of ESRD- s/p transfusion and now on ESA- will inc dose,  iron stores low, will give  ferrlecit 4. HTN- stable- coreg only  5. A fib on heparin gtt 6. Bones- will check PTH- pending , phos OK no binder  7. Bilateral PAD with gangrenous changes.  Per primary and VVS following and may need bypass surgery. 8. Disposition- will need outpatient HD to be arranged. Not sure how close she is to discharge on other fronts   [place] [phone]

## 2020-07-13 NOTE — Care Management Important Message (Signed)
Important Message  Patient Details  Name: Mardell Suttles MRN: [id number] Date of Birth: 1951/03/01   Medicare Important Message Given:  Yes - Important Message mailed due to current National Emergency   Verbal consent obtained due to current National Emergency  Relationship to patient: Self Contact Name: Giovana Faciane Call Date: 07/13/20  Time: 1415 Phone: [phone] Outcome: Spoke with contact Important Message mailed to: Patient address on file    Delorse Lek 07/13/2020, 2:15 PM

## 2020-07-13 NOTE — Progress Notes (Signed)
Physical Therapy Treatment Patient Details Name: Kathryn Hancock MRN: [id number] DOB: 04-04-1951 Today's Date: 07/13/2020    History of Present Illness Kathryn Hancock is a 70 y.o. female with history of left atrial myxoma, diabetes mellitus type 2, hypertension, chronic kidney disease stage III who had a left atrial myxoma resection and third week of December last month at [facility] during which patient had worsening renal function and lower extremity ischemic change and postoperative A. fib.    PT Comments    Patient received in bed, pleasant and cooperative today. Continues to require ongoing cues to maintain sternal precautions during dynamic situations, but improved since eval. Tolerated significant progression in gait distance with RW, although she is easily fatigued. Politely declines up to recliner despite education on benefits of OOB time. Left in bed with all needs met, bed alarm active. Will continue to follow.     Follow Up Recommendations  Home health PT     Equipment Recommendations  Rolling walker with 5" wheels;3in1 (PT)    Recommendations for Other Services       Precautions / Restrictions Precautions Precautions: Fall;Other (comment);Sternal Precaution Booklet Issued: No (verbally reviewed) Precaution Comments: no LE WB restrictions per secure chat Dr. Scot Dock 1/27; heart surgery Dec 2021 - sternal precautions Restrictions Weight Bearing Restrictions: No    Mobility  Bed Mobility Overal bed mobility: Needs Assistance Bed Mobility: Supine to Sit;Sit to Supine     Supine to sit: Modified independent (Device/Increase time);HOB elevated Sit to supine: Modified independent (Device/Increase time);HOB elevated   General bed mobility comments: no physical assist given, cues to maintain sternal precautions  Transfers Overall transfer level: Needs assistance Equipment used: Rolling walker (2 wheeled) Transfers: Sit to/from Stand Sit to Stand: Min guard          General transfer comment: min guard with VC to maintain sternal precautions with all transfers today, no physical assist given  Ambulation/Gait Ambulation/Gait assistance: Min guard Gait Distance (Feet): 140 Feet Assistive device: Rolling walker (2 wheeled) Gait Pattern/deviations: Step-through pattern;Antalgic;Trunk flexed Gait velocity: decreased   General Gait Details: slow and steady with RW, less antalgic today but easily fatigued   Chief Strategy Officer    Modified Rankin (Stroke Patients Only)       Balance Overall balance assessment: Needs assistance Sitting-balance support: No upper extremity supported;Feet supported Sitting balance-Leahy Scale: Normal Sitting balance - Comments: able to don socks at EOB   Standing balance support: No upper extremity supported;During functional activity Standing balance-Leahy Scale: Fair Standing balance comment: able to maintain balance dynamically with min guard/no UE support, benefits from BUE support dynamically                            Cognition Arousal/Alertness: Awake/alert Behavior During Therapy: WFL for tasks assessed/performed Overall Cognitive Status: Within Functional Limits for tasks assessed                                 General Comments: A&Ox4, pleasant and cooperative, some ongoing impairments in safety awareness/adherence to sternal precautions      Exercises      General Comments General comments (skin integrity, edema, etc.): verbally reviewed sternal precautions and reinforced during session      Pertinent Vitals/Pain Pain Assessment: Faces Faces Pain Scale: Hurts a little bit Pain Location: B  feet in dependent position Pain Descriptors / Indicators: Aching;Discomfort Pain Intervention(s): Limited activity within patient's tolerance;Monitored during session    Home Living                      Prior Function            PT  Goals (current goals can now be found in the care plan section) Acute Rehab PT Goals Patient Stated Goal: go home, pain control of LEs PT Goal Formulation: With patient Time For Goal Achievement: 07/21/20 Potential to Achieve Goals: Good Progress towards PT goals: Progressing toward goals    Frequency    Min 3X/week      PT Plan Current plan remains appropriate    Co-evaluation              AM-PAC PT "6 Clicks" Mobility   Outcome Measure  Help needed turning from your back to your side while in a flat bed without using bedrails?: None Help needed moving from lying on your back to sitting on the side of a flat bed without using bedrails?: None Help needed moving to and from a bed to a chair (including a wheelchair)?: A Little Help needed standing up from a chair using your arms (e.g., wheelchair or bedside chair)?: A Little Help needed to walk in hospital room?: A Little Help needed climbing 3-5 steps with a railing? : A Little 6 Click Score: 20    End of Session   Activity Tolerance: Patient tolerated treatment well Patient left: in bed;with call bell/phone within reach;with bed alarm set Nurse Communication: Mobility status PT Visit Diagnosis: Difficulty in walking, not elsewhere classified (R26.2);Pain;Muscle weakness (generalized) (M62.81) Pain - Right/Left:  (bilateral) Pain - part of body: Ankle and joints of foot     Time: 7425-9563 PT Time Calculation (min) (ACUTE ONLY): 25 min  Charges:  $Gait Training: 8-22 mins $Therapeutic Activity: 8-22 mins                     Windell Norfolk, DPT, PN1   Supplemental Physical Therapist [place]    Pager [phone] Acute Rehab Office [phone]

## 2020-07-14 ENCOUNTER — Other Ambulatory Visit: Payer: Self-pay

## 2020-07-14 DIAGNOSIS — N184 Chronic kidney disease, stage 4 (severe): Secondary | ICD-10-CM

## 2020-07-14 DIAGNOSIS — N179 Acute kidney failure, unspecified: Secondary | ICD-10-CM | POA: Diagnosis not present

## 2020-07-14 DIAGNOSIS — I739 Peripheral vascular disease, unspecified: Secondary | ICD-10-CM

## 2020-07-14 LAB — CBC
HCT: 27.4 % — ABNORMAL LOW (ref 36.0–46.0)
Hemoglobin: 8.8 g/dL — ABNORMAL LOW (ref 12.0–15.0)
MCH: 32.4 pg (ref 26.0–34.0)
MCHC: 32.1 g/dL (ref 30.0–36.0)
MCV: 100.7 fL — ABNORMAL HIGH (ref 80.0–100.0)
Platelets: 114 10*3/uL — ABNORMAL LOW (ref 150–400)
RBC: 2.72 MIL/uL — ABNORMAL LOW (ref 3.87–5.11)
RDW: 15.2 % (ref 11.5–15.5)
WBC: 7.4 10*3/uL (ref 4.0–10.5)
nRBC: 0 % (ref 0.0–0.2)

## 2020-07-14 LAB — HEPARIN LEVEL (UNFRACTIONATED): Heparin Unfractionated: 0.36 IU/mL (ref 0.30–0.70)

## 2020-07-14 LAB — RENAL FUNCTION PANEL
Albumin: 1.9 g/dL — ABNORMAL LOW (ref 3.5–5.0)
Anion gap: 9 (ref 5–15)
BUN: 27 mg/dL — ABNORMAL HIGH (ref 8–23)
CO2: 24 mmol/L (ref 22–32)
Calcium: 8.2 mg/dL — ABNORMAL LOW (ref 8.9–10.3)
Chloride: 105 mmol/L (ref 98–111)
Creatinine, Ser: 4.37 mg/dL — ABNORMAL HIGH (ref 0.44–1.00)
GFR, Estimated: 11 mL/min — ABNORMAL LOW (ref >60)
Glucose, Bld: 116 mg/dL — ABNORMAL HIGH (ref 70–99)
Phosphorus: 4.1 mg/dL (ref 2.5–4.6)
Potassium: 3.9 mmol/L (ref 3.5–5.1)
Sodium: 138 mmol/L (ref 135–145)

## 2020-07-14 LAB — GLUCOSE, CAPILLARY
Glucose-Capillary: 101 mg/dL — ABNORMAL HIGH (ref 70–99)
Glucose-Capillary: 123 mg/dL — ABNORMAL HIGH (ref 70–99)

## 2020-07-14 LAB — PARATHYROID HORMONE, INTACT (NO CA): PTH: 45 pg/mL (ref 15–65)

## 2020-07-14 MED ORDER — CARVEDILOL 6.25 MG PO TABS
6.2500 mg | ORAL_TABLET | Freq: Two times a day (BID) | ORAL | Status: DC
Start: 2020-07-14 — End: 2020-07-15
  Administered 2020-07-14 – 2020-07-15 (×3): 6.25 mg via ORAL
  Filled 2020-07-14 (×3): qty 1

## 2020-07-14 MED ORDER — HEPARIN SODIUM (PORCINE) 1000 UNIT/ML IJ SOLN
INTRAMUSCULAR | Status: AC
  Administered 2020-07-14: 1000 [IU] via INTRAVENOUS_CENTRAL
  Filled 2020-07-14: qty 4

## 2020-07-14 NOTE — Progress Notes (Signed)
Patient ID: Kathryn Hancock, female   DOB: 03/05/1951, 70 y.o.   MRN: [id number]    S: Seen in HD-  No c/o's -  Happy to hear that she might be going home tomorrow if cleared from vascular    O:BP (!) 139/54   Pulse (!) 58   Temp 97.9 F (36.6 C) (Oral)   Resp 17   Ht 5\' 3"  (1.6 m)   Wt 71.7 kg   SpO2 96%   BMI 28.00 kg/m   Intake/Output Summary (Last 24 hours) at 07/14/2020 1104 Last data filed at 07/14/2020 0900 Gross per 24 hour  Intake 956.1 ml  Output 0 ml  Net 956.1 ml   Intake/Output: I/O last 3 completed shifts: In: 1428.5 [P.O.:1020; I.V.:312.2; IV Piggyback:96.3] Out: 0   Intake/Output this shift:  Total I/O In: 99.8 [I.V.:99.8] Out: 0  Weight change:  Gen: NAD CVS: rrr, no rub Resp: cta Abd: benign Ext: no edema, ischemic toes bilaterally, LUE BVT +T/B- placed 1/31 Dialysis access:  [facility]  Recent Labs  Lab 07/07/20 1420 07/08/20 0500 07/09/20 0432 07/10/20 0417 07/11/20 0304 07/12/20 0809 07/14/20 0914  NA 137 140 138 139 137 138 138  K 4.1 3.7 4.1 4.1 4.0 4.2 3.9  CL 103 103 104 106 103 103 105  CO2 23 25 23 22 25 25 24   GLUCOSE 143* 85 103* 86 119* 124* 116*  BUN 65* 37* 40* 38* 24* 30* 27*  CREATININE 6.17* 4.45* 4.88* 5.00* 3.81* 4.69* 4.37*  ALBUMIN 2.2*  --  2.2* 2.1*  --  2.1* 1.9*  CALCIUM 8.3* 8.3* 8.4* 8.6* 8.2* 8.4* 8.2*  PHOS 5.1*  --  4.2 4.5  --  4.1 4.1   Liver Function Tests: Recent Labs  Lab 07/10/20 0417 07/12/20 0809 07/14/20 0914  ALBUMIN 2.1* 2.1* 1.9*   No results for input(s): LIPASE, AMYLASE in the last 168 hours. No results for input(s): AMMONIA in the last 168 hours. CBC: Recent Labs  Lab 07/10/20 0417 07/11/20 0304 07/12/20 0809 07/13/20 0159 07/14/20 0313  WBC 7.2 7.3 7.0 6.2 7.4  HGB 8.7* 8.5* 8.5* 8.3* 8.8*  HCT 28.1* 25.6* 27.0* 25.2* 27.4*  MCV 101.4* 99.6 101.9* 98.8 100.7*  PLT 124* 115* 122* 100* 114*   Cardiac Enzymes: No results for input(s): CKTOTAL, CKMB, CKMBINDEX, TROPONINI in the last 168  hours. CBG: Recent Labs  Lab 07/12/20 1628 07/12/20 2156 07/13/20 0645 07/13/20 1113 07/13/20 1726  GLUCAP 146* 139* 97 114* 85    Iron Studies:  Recent Labs    07/13/20 0159  IRON 37  TIBC 176*  FERRITIN 182   Studies/Results: No results found. Marland Kitchen amiodarone  200 mg Oral Daily  . aspirin EC  81 mg Oral Daily  . atorvastatin  40 mg Oral q1800  . carvedilol  6.25 mg Oral BID WC  . Chlorhexidine Gluconate Cloth  6 each Topical Q0600  . [START ON 07/19/2020] darbepoetin (ARANESP) injection - DIALYSIS  100 mcg Intravenous Q Tue-HD  . folic acid  9,163 mcg Oral Daily  . heparin sodium (porcine)      . sodium chloride flush  3 mL Intravenous Q12H    BMET    Component Value Date/Time   NA 138 07/14/2020 0914   NA 139 05/26/2020 1158   K 3.9 07/14/2020 0914   CL 105 07/14/2020 0914   CO2 24 07/14/2020 0914   GLUCOSE 116 (H) 07/14/2020 0914   BUN 27 (H) 07/14/2020 0914   BUN 75 (H)  05/26/2020 1158   CREATININE 4.37 (H) 07/14/2020 0914   CALCIUM 8.2 (L) 07/14/2020 0914   GFRNONAA 11 (L) 07/14/2020 0914   GFRAA 16 (L) 05/26/2020 1158   CBC    Component Value Date/Time   WBC 7.4 07/14/2020 0313   RBC 2.72 (L) 07/14/2020 0313   HGB 8.8 (L) 07/14/2020 0313   HGB 9.8 (L) 05/26/2020 1158   HCT 27.4 (L) 07/14/2020 0313   HCT 29.6 (L) 05/26/2020 1158   PLT 114 (L) 07/14/2020 0313   PLT 286 05/26/2020 1158   MCV 100.7 (H) 07/14/2020 0313   MCV 100 (H) 05/26/2020 1158   MCH 32.4 07/14/2020 0313   MCHC 32.1 07/14/2020 0313   RDW 15.2 07/14/2020 0313   RDW 14.1 05/26/2020 1158     Assessment/Plan:  1. 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  Started on HD 1/27 and had second session on 1/30. 3rd HD 2/1 -  4th treatment today.  Has spot in [place] TTS can start there Sat if signs papers on Friday.  Possible discharge soon 2. Vascular access:  RIJ TDC placed 07/07/20, LUE BVT created 07/11/20- bruised but  patent 3. Anemia of ESRD- s/p transfusion and now on ESA- will inc dose,  iron stores low,  giving ferrlecit 4. HTN- stable- coreg only  5. A fib on heparin gtt 6. Bones-  PTH 45, phos OK no binder  7. Bilateral PAD with gangrenous changes.  Per primary and VVS following and may need bypass surgery but patient thinks they will wait until she is stronger. 8. Disposition- has OP spot in [place]-  Can start there Saturday.  Pt could be discharged from my standpoint, further fine tuning of status there   [place] [phone]

## 2020-07-14 NOTE — Progress Notes (Signed)
PROGRESS NOTE  Kathryn Hancock KCL:[id number] DOB: [birth year] DOA: 06/30/2020 PCP: Gala Lewandowsky, MD   LOS: 13 days   Brief Narrative / Interim history: 70 year old female with left atrial myxoma resected in December 2021 at [facility], DM2, HTN, CKD 3.  During her prior hospital stay at [facility] patient had worsening renal function and lower extremity ischemic change and postoperative A. fib. 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 Was instructed to repeat and the repeat on showed worsening and was advised to come to the ER. Patient states over the last 2 weeks patient has been worsening nausea vomiting unable to keep anything but denies any abdominal pain or diarrhea.  Subjective / 24h Interval events: Seen and examined in dialysis unit.  She has no new complaint.  She states that her the pain in the toes is either slightly better or remains the same but not worse.  When discussed with her about potential discharge today or tomorrow, she feels more comfortable with tomorrow.    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  Nephrology consulted, now started on dialysis, underwent catheter placement on 1/26. -Vein mapping done.  Patient underwent left first stage brachiobasilic AV fistula by vascular surgery on 07/11/2020.  Dialysis per nephrology.  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  If she is worse she would need staged bilateral femoral to above-knee  popliteal artery bypass graft.  Currently stable, vascular surgery following.  Appreciate help.  Waiting for vascular surgery to provide their final recommendations and if they would clear her for discharge tomorrow.  Nausea/vomiting -Could be secondary from uremia. CT renal studies shows no obstruction/hydronephrosis/pyelonephritis..  Symptoms resolved.  Hyperkalemia -In the setting of worsening kidney function.   -Resolved after Lokelma.  Managed with dialysis  Paroxysmal A. Fib -Rate controlled. -Continue Coreg and amiodarone.  Hold Eliquis in the setting of worsening kidney function.  Continue on heparin until fully cleared from vascular perspective and no further plans of procedure.  Hypertension -Stable.  Continue amlodipine and Coreg  Type 2 diabetes mellitus -Well controlled.  A1c 5.6% SSI discontinued due to hypoglycemia.  Will discontinue CBG checking as well.  Anemia of chronic disease -Macrocytosis.    She is status post unit of packed red blood cells for hemoglobin of 6.6, currently improved and hemoglobin is stable at 8.7  Recent open heart surgery for left atrial myxoma at [facility]: -In December 2021.  Patient denies an chest pain or shortness of breath.  Continue to monitor -Continue aspirin, statin  History of abdominal aortic aneurysm  -Reviewed CT renal study showed unchanged from prior exam back to January 15/2021.  Tobacco abuse -smokes 1 pack of cigarettes per day-tells me that she is working on cessation.  Cuts back from 2 packs to 1 pack.   Scheduled Meds: . amiodarone  200 mg Oral Daily  . aspirin EC  81 mg Oral Daily  . atorvastatin  40 mg Oral q1800  . carvedilol  6.25 mg Oral BID WC  . Chlorhexidine Gluconate Cloth  6 each Topical Q0600  . [START  ON 07/19/2020] darbepoetin (ARANESP) injection - DIALYSIS  100 mcg Intravenous Q Tue-HD  . folic acid  6,063 mcg Oral Daily  . sodium chloride flush  3 mL Intravenous Q12H   Continuous  Infusions: . sodium chloride    . sodium chloride    . sodium chloride    . ferric gluconate (FERRLECIT/NULECIT) IV 125 mg (07/14/20 1035)  . heparin 1,000 Units/hr (07/14/20 1207)   PRN Meds:.sodium chloride, sodium chloride, sodium chloride, acetaminophen **OR** acetaminophen, alteplase, heparin, hydrALAZINE, labetalol, lidocaine (PF), lidocaine-prilocaine, ondansetron (ZOFRAN) IV, oxyCODONE, pentafluoroprop-tetrafluoroeth, sodium chloride flush  Diet Orders (From admission, onward)    Start     Ordered   07/11/20 1306  Diet renal with fluid restriction Fluid restriction: 1200 mL Fluid; Room service appropriate? Yes; Fluid consistency: Thin  Diet effective now       Question Answer Comment  Fluid restriction: 1200 mL Fluid   Room service appropriate? Yes   Fluid consistency: Thin      07/11/20 1305          DVT prophylaxis:      Code Status: Full Code  Family Communication: none   Status is: Inpatient  Remains inpatient appropriate because:Inpatient level of care appropriate due to severity of illness   Dispo: The patient is from: Home              Anticipated d/c is to: Home              Anticipated d/c date is: 07/15/2020              Patient currently is not medically stable to d/c.   Difficult to place patient No  Level of care: Telemetry Medical  Consultants:  Nephrology  Vascular surgery   Procedures:  Arteriogram   Microbiology  None   Antimicrobials: None     Objective: Vitals:   07/14/20 1030 07/14/20 1100 07/14/20 1115 07/14/20 1133  BP: (!) 127/52 (!) 125/54 (!) 121/52 (!) 118/56  Pulse:   64 66  Resp: 16 17 18 18   Temp:   98.1 F (36.7 C) 98.4 F (36.9 C)  TempSrc:   Oral Oral  SpO2:   95% 98%  Weight:   70.5 kg   Height:        Intake/Output Summary (Last 24 hours) at 07/14/2020 1240 Last data filed at 07/14/2020 1207 Gross per 24 hour  Intake 1057.17 ml  Output 1225 ml  Net -167.83 ml   Filed Weights   07/12/20 2155 07/14/20  0840 07/14/20 1115  Weight: 71.5 kg 71.7 kg 70.5 kg    Examination: General exam: Appears calm and comfortable  Respiratory system: Clear to auscultation. Respiratory effort normal. Cardiovascular system: S1 & S2 heard, RRR. No JVD, murmurs, rubs, gallops or clicks. No pedal edema. Gastrointestinal system: Abdomen is nondistended, soft and nontender. No organomegaly or masses felt. Normal bowel sounds heard. Central nervous system: Alert and oriented. No focal neurological deficits. Extremities: Symmetric 5 x 5 power. Skin: Ischemic toes bilaterally, as pictured below. Psychiatry: Judgement and insight appear normal. Mood & affect appropriate.       Data Reviewed: I have independently reviewed following labs and imaging studies   CBC: Recent Labs  Lab 07/10/20 0417 07/11/20 0304 07/12/20 0809 07/13/20 0159 07/14/20 0313  WBC 7.2 7.3 7.0 6.2 7.4  HGB 8.7* 8.5* 8.5* 8.3* 8.8*  HCT 28.1* 25.6* 27.0* 25.2* 27.4*  MCV 101.4* 99.6 101.9* 98.8 100.7*  PLT 124* 115* 122* 100* 114*  Basic Metabolic Panel: Recent Labs  Lab 07/07/20 1420 07/08/20 0500 07/09/20 0432 07/10/20 0417 07/11/20 0304 07/12/20 0809 07/14/20 0914  NA 137   < > 138 139 137 138 138  K 4.1   < > 4.1 4.1 4.0 4.2 3.9  CL 103   < > 104 106 103 103 105  CO2 23   < > 23 22 25 25 24   GLUCOSE 143*   < > 103* 86 119* 124* 116*  BUN 65*   < > 40* 38* 24* 30* 27*  CREATININE 6.17*   < > 4.88* 5.00* 3.81* 4.69* 4.37*  CALCIUM 8.3*   < > 8.4* 8.6* 8.2* 8.4* 8.2*  PHOS 5.1*  --  4.2 4.5  --  4.1 4.1   < > = values in this interval not displayed.   Liver Function Tests: Recent Labs  Lab 07/07/20 1420 07/09/20 0432 07/10/20 0417 07/12/20 0809 07/14/20 0914  ALBUMIN 2.2* 2.2* 2.1* 2.1* 1.9*   Coagulation Profile: Recent Labs  Lab 07/11/20 0304  INR 1.2   HbA1C: No results for input(s): HGBA1C in the last 72 hours. CBG: Recent Labs  Lab 07/12/20 2156 07/13/20 0645 07/13/20 1113 07/13/20 1726  07/14/20 1127  GLUCAP 139* 97 114* 85 101*    No results found for this or any previous visit (from the past 240 hour(s)).   Radiology Studies: No results found. Darliss Cheney, MD [facility]  Between 7 am - 7 pm I am available, please contact me via Amion or [place]  Between 7 pm - 7 am I am not available, please contact night coverage MD/APP via Amion

## 2020-07-14 NOTE — Progress Notes (Signed)
ANTICOAGULATION CONSULT NOTE - Follow Up Consult  Pharmacy Consult for IV Heparin Indication: atrial fibrillation  Allergies  Allergen Reactions  . Penicillins Other (See Comments)    Childhood reaction.    Patient Measurements: Height: 5\' 3"  (160 cm) Weight: 70.5 kg (155 lb 6.8 oz) IBW/kg (Calculated) : 52.4  Heparin Dosing Weight: 67.3 kg  Vital Signs: Temp: 98.4 F (36.9 C) (02/03 1133) Temp Source: Oral (02/03 1133) BP: 118/56 (02/03 1133) Pulse Rate: 66 (02/03 1133)  Labs: Recent Labs    07/12/20 0809 07/12/20 2048 07/13/20 0159 07/14/20 0313 07/14/20 0900 07/14/20 0914  HGB 8.5*  --  8.3* 8.8*  --   --   HCT 27.0*  --  25.2* 27.4*  --   --   PLT 122*  --  100* 114*  --   --   HEPARINUNFRC  --  1.04*  --   --  0.36  --   CREATININE 4.69*  --   --   --   --  4.37*    Estimated Creatinine Clearance: 11.8 mL/min (A) (by C-G formula based on SCr of 4.37 mg/dL (H)).  Assessment: 70 yr old female went to PCP for N/V and found to have "abnormal labs" and sent to ED, found to be in AKI on CKD, now dialysis dependent, to transitioned from apixaban for Afib to IV heparin (last apixaban taken 1/20 at 1000 AM).  Pt is S/P AVF placement 1/31, heparin resumed 2/1 pm per VVS.   Unable to obtain daily heparin levels d/t access issues and site of IV heparin infusion relative to available venipuncture sites.  Prior pharmacist discussed with Vascular, Renal and Hospitalist on 2/1 pm when heparin level was supratherapeutic, but invalid.  Per Dr. Luther Parody guidance, to continue current heparin infusion rate (1000 units/hr) since she had been therapeutic on this rate before the AVF was placed and not check heparin levels (monitor for bleeding). Pt was not ready to transition back to apixaban because other VVS procedures may be done this admission. Now noted possibly going home tomorrow if cleared by Vascular.  Heparin level pre-HD today is therapeutic (0.36) on 1000 units/hr Platelet  count low stable, no bleeding reported.   Goal of Therapy:  Heparin level 0.3-0.7 units/ml  Monitor platelets by anticoagulation protocol: Yes   Plan:  Continue heparin drip at 1000 units/hr  Heparin level with pre-HD labs, next expected 07/16/20. Daily CBC while on heparin Monitor closely for signs/symptoms of bleeding Follow up Vascular input and plans.   Resume Eliquis when able.   Arty Baumgartner, [place] 07/14/2020 11:48 AM

## 2020-07-14 NOTE — Procedures (Signed)
Patient was seen on dialysis and the procedure was supervised.  BFR 400  Via TDC BP is  116/47.   Patient appears to be tolerating treatment well  Louis Meckel 07/14/2020

## 2020-07-14 NOTE — Plan of Care (Signed)
  Problem: Clinical Measurements: Goal: Diagnostic test results will improve Outcome: Progressing   Problem: Nutrition: Goal: Adequate nutrition will be maintained Outcome: Progressing   Problem: Coping: Goal: Level of anxiety will decrease Outcome: Progressing   

## 2020-07-14 NOTE — Progress Notes (Signed)
Occupational Therapy Treatment Patient Details Name: Kathryn Hancock MRN: [id number] DOB: June 14, 1950 Today's Date: 07/14/2020    History of present illness Offie Pickron is a 70 y.o. female with history of left atrial myxoma, diabetes mellitus type 2, hypertension, chronic kidney disease stage III who had a left atrial myxoma resection and third week of December last month at [facility] during which patient had worsening renal function and lower extremity ischemic change and postoperative A. fib.   OT comments  Pt received awake and alert, agreeable to OT session. Mod I supine to sit with HOB elevated to present to EOB. Set up for combing hair and Mod I LB dressing of donning sock with figure 4 position. Pt observed to use energy conservation technique but resting in between the two ADL tasks. Simulated toilet transfer from bed to chair with min guard. No concerns of not adhering to sternal precautions this sessions. DC and freq remains the same.   Follow Up Recommendations  Home health OT;Supervision/Assistance - 24 hour    Equipment Recommendations  3 in 1 bedside commode    Recommendations for Other Services      Precautions / Restrictions Precautions Precautions: Fall;Other (comment);Sternal Precaution Booklet Issued: No (verbally reviewed) Precaution Comments: no LE WB restrictions per secure chat Dr. Scot Dock 1/27; heart surgery Dec 2021 - sternal precautions Restrictions Weight Bearing Restrictions: No       Mobility Bed Mobility Overal bed mobility: Needs Assistance Bed Mobility: Supine to Sit;Sit to Supine     Supine to sit: Modified independent (Device/Increase time);HOB elevated     General bed mobility comments: HOB elevated, to assist with sitting EOB.  Transfers Overall transfer level: Needs assistance Equipment used: Rolling walker (2 wheeled) Transfers: Sit to/from Stand Sit to Stand: Min guard         General transfer comment: min guard with VC  to maintain sternal precautions with all transfers today, no physical assist given    Balance Overall balance assessment: Needs assistance Sitting-balance support: No upper extremity supported;Feet supported Sitting balance-Leahy Scale: Normal Sitting balance - Comments: able to don socks at EOB   Standing balance support: No upper extremity supported;During functional activity Standing balance-Leahy Scale: Fair Standing balance comment: able to maintain balance dynamically with min guard/no UE support, benefits from BUE support dynamically                           ADL either performed or assessed with clinical judgement   ADL Overall ADL's : Needs assistance/impaired     Grooming: Sitting;Set up;Brushing hair Grooming Details (indicate cue type and reason): rest break required after donning of socks.             Lower Body Dressing: Sit to/from stand;Set up;Modified independent Lower Body Dressing Details (indicate cue type and reason): mod I to set up with increased time due to decreased endurance. Figure 4 position observed with donning socks. Toilet Transfer: Min guard;Ambulation;RW Toilet Transfer Details (indicate cue type and reason): No physical assistance required mostly for safety. Good use of RW and sequencing. SImulated from bed to chair.         Functional mobility during ADLs: Min guard;Rolling walker General ADL Comments: Pt with mild limitations in endurance     Vision   Vision Assessment?: No apparent visual deficits   Perception     Praxis      Cognition Arousal/Alertness: Awake/alert Behavior During Therapy: WFL for tasks assessed/performed Overall Cognitive  Status: Within Functional Limits for tasks assessed                                 General Comments: A&Ox4, pleasant and cooperative, some ongoing impairments in safety awareness/adherence to sternal precautions        Exercises     Shoulder Instructions        General Comments      Pertinent Vitals/ Pain       Pain Assessment: Faces Faces Pain Scale: No hurt Pain Location: B feet in dependent position Pain Descriptors / Indicators: Aching;Discomfort Pain Intervention(s): Monitored during session;Repositioned  Home Living                                          Prior Functioning/Environment              Frequency  Min 2X/week        Progress Toward Goals  OT Goals(current goals can now be found in the care plan section)  Progress towards OT goals: Progressing toward goals  Acute Rehab OT Goals Patient Stated Goal: go home, pain control of LEs OT Goal Formulation: With patient Time For Goal Achievement: 07/20/20 Potential to Achieve Goals: Good ADL Goals Pt Will Perform Grooming: with modified independence;standing Pt Will Perform Lower Body Dressing: with modified independence;sitting/lateral leans;sit to/from stand Pt Will Transfer to Toilet: with modified independence;ambulating Pt Will Perform Toileting - Clothing Manipulation and hygiene: with modified independence;sitting/lateral leans;sit to/from stand Additional ADL Goal #1: Pt to verbalize 3 energy conservation strategies to implement during daily tasks Additional ADL Goal #2: Pt to increase functional activity tolerance to 5 minutes without rest break to improve overall endurance  Plan Discharge plan remains appropriate;Frequency remains appropriate    Co-evaluation                 AM-PAC OT "6 Clicks" Daily Activity     Outcome Measure   Help from another person eating meals?: None Help from another person taking care of personal grooming?: A Little Help from another person toileting, which includes using toliet, bedpan, or urinal?: A Little Help from another person bathing (including washing, rinsing, drying)?: A Little Help from another person to put on and taking off regular upper body clothing?: A Little Help from  another person to put on and taking off regular lower body clothing?: A Little 6 Click Score: 19    End of Session Equipment Utilized During Treatment: Gait belt  OT Visit Diagnosis: Muscle weakness (generalized) (M62.81);Other abnormalities of gait and mobility (R26.89);Pain Pain - Right/Left:  (bilateral) Pain - part of body: Ankle and joints of foot   Activity Tolerance Patient tolerated treatment well   Patient Left with call bell/phone within reach;in chair;with chair alarm set   Nurse Communication Mobility status;Weight bearing status        Time: 2263-3354 OT Time Calculation (min): 15 min  Charges: OT General Charges $OT Visit: 1 Visit OT Treatments $Self Care/Home Management : 8-22 mins  Minus Breeding, MSOT, OTR/L  Supplemental Rehabilitation Services  [phone]    Marius Ditch 07/14/2020, 1:07 PM

## 2020-07-14 NOTE — Progress Notes (Signed)
OT Cancellation Note  Patient Details Name: Kathryn Hancock MRN: [id number] DOB: [birth year]   Cancelled Treatment:    Reason Eval/Treat Not Completed: Patient at procedure or test/ unavailable. Pt currently out of room and at HD. Will continue to follow once available.   Hoy Morn, [place]  [phone]  07/14/2020, 9:34 AM

## 2020-07-14 NOTE — Discharge Instructions (Signed)
[facility]  Discharge Instructions  AV Fistula or Graft Surgery for Dialysis Access  Please refer to the following instructions for your post-procedure care. Your surgeon or physician assistant will discuss any changes with you.  Activity  You may drive the day following your surgery, if you are comfortable and no longer taking prescription pain medication. Resume full activity as the soreness in your incision resolves.  Bathing/Showering  You may shower after you go home. Keep your incision dry for 48 hours. Do not soak in a bathtub, hot tub, or swim until the incision heals completely. You may not shower if you have a hemodialysis catheter.  Incision Care  Clean your incision with mild soap and water after 48 hours. Pat the area dry with a clean towel. You do not need a bandage unless otherwise instructed. Do not apply any ointments or creams to your incision. You may have skin glue on your incision. Do not peel it off. It will come off on its own in about one week. Your arm may swell a bit after surgery. To reduce swelling use pillows to elevate your arm so it is above your heart. Your doctor will tell you if you need to lightly wrap your arm with an ACE bandage.  Diet  Resume your normal diet. There are not special food restrictions following this procedure. In order to heal from your surgery, it is CRITICAL to get adequate nutrition. Your body requires vitamins, minerals, and protein. Vegetables are the best source of vitamins and minerals. Vegetables also provide the perfect balance of protein. Processed food has little nutritional value, so try to avoid this.  Medications  Resume taking all of your medications. If your incision is causing pain, you may take over-the counter pain relievers such as acetaminophen (Tylenol). If you were prescribed a stronger pain medication, please be aware these medications can cause nausea and constipation. Prevent  nausea by taking the medication with a snack or meal. Avoid constipation by drinking plenty of fluids and eating foods with high amount of fiber, such as fruits, vegetables, and grains.  Do not take Tylenol if you are taking prescription pain medications.  Follow up Your surgeon may want to see you in the office following your access surgery. If so, this will be arranged at the time of your surgery.  Please call us immediately for any of the following conditions:   Increased pain, redness, drainage (pus) from your incision site  Fever of 101 degrees or higher  Severe or worsening pain at your incision site  Hand pain or numbness.   Reduce your risk of vascular disease:   Stop smoking. If you would like help, call [facility] at 1-800-QUIT-NOW (720) 281-4924) or [place] at [place] your cholesterol  Maintain a desired weight  Control your diabetes  Keep your blood pressure down  Dialysis  It will take several weeks to several months for your new dialysis access to be ready for use. Your surgeon will determine when it is okay to use it. Your nephrologist will continue to direct your dialysis. You can continue to use your Permcath until your new access is ready for use.   07/14/2020 Kathryn Hancock [id number] 1950/09/17  Surgeon(s): [name], MD  Procedure(s): LEFT FIRST STAGE BRACHIOBASILIC UPPER EXTREMITY ARTERIOVENOUS (AV) FISTULA CREATION   May stick graft immediately   May stick graft on designated area only:   X Do not stick left AV fistula for 12  weeks    If you have any questions, please call the office at [phone].

## 2020-07-14 NOTE — Plan of Care (Signed)
  Problem: Education: Goal: Knowledge of General Education information will improve Description Including pain rating scale, medication(s)/side effects and non-pharmacologic comfort measures Outcome: Progressing   

## 2020-07-14 NOTE — Progress Notes (Signed)
Physical Therapy Treatment Patient Details Name: Kathryn Hancock MRN: [id number] DOB: 1950-08-14 Today's Date: 07/14/2020    History of Present Illness Kathryn Hancock is a 70 y.o. female with history of left atrial myxoma, diabetes mellitus type 2, hypertension, chronic kidney disease stage III who had a left atrial myxoma resection and third week of December last month at [facility] during which patient had worsening renal function and lower extremity ischemic change and postoperative A. fib.    PT Comments    Patient received in bed, fatigued but agreeable to PT session. Focus on step navigation and home entry- she tells me that she usually goes up her step by lifting and shimmying her rollator inside, which would certainly break her sternal precautions. Discussed and demonstrated multiple safer options for getting inside her home, ultimately settled on her daughter assisting her with HHA for balance/safety and navigating step forward without device; made it very clear her daughter would need to help her with her balance and bring her device inside or outside of the house for her. Education provided on safety and techniques for home entry/exit including having her daughter bring her rollator outside first so it will be easier for her to descend the step. She became nauseous and vomited at EOS, RN aware. Left in bed with all needs met this afternoon. Per patient and RN, plan is currently for DC home tomorrow.   Follow Up Recommendations  Home health PT     Equipment Recommendations  Rolling walker with 5" wheels;3in1 (PT)    Recommendations for Other Services       Precautions / Restrictions Precautions Precautions: Fall;Other (comment);Sternal Precaution Booklet Issued: No (verbally reviewed) Precaution Comments: no LE WB restrictions per secure chat Dr. Scot Dock 1/27; heart surgery Dec 2021 - sternal precautions Restrictions Weight Bearing Restrictions: No    Mobility  Bed  Mobility Overal bed mobility: Needs Assistance Bed Mobility: Supine to Sit;Sit to Supine     Supine to sit: Modified independent (Device/Increase time);HOB elevated Sit to supine: Modified independent (Device/Increase time);HOB elevated   General bed mobility comments: HOB elevated, to assist with sitting EOB.  Transfers Overall transfer level: Needs assistance Equipment used: 1 person hand held assist Transfers: Sit to/from Stand Sit to Stand: Min guard         General transfer comment: min guard to boost to standing without device, mildly unsteady but able to correct without increased assist  Ambulation/Gait Ambulation/Gait assistance: Min assist Gait Distance (Feet): 5 Feet Assistive device: 1 person hand held assist Gait Pattern/deviations: Step-to pattern;Antalgic;Decreased step length - right;Decreased step length - left;Decreased stride length Gait velocity: decreased   General Gait Details: slow and mildly unsteady- performed with no device to mimic getting into her home   Stairs Stairs: Yes Stairs assistance: Min assist Stair Management: No rails;Forwards Number of Stairs: 2 General stair comments: practiced ascent/descent of single step twice- discussed multiple techniques and ultimately decided that the safest way to navigate step while maintaining sternal precautions is with HHA from her daughter instead of using RW or rollator   Wheelchair Mobility    Modified Rankin (Stroke Patients Only)       Balance Overall balance assessment: Needs assistance Sitting-balance support: No upper extremity supported;Feet supported Sitting balance-Leahy Scale: Normal Sitting balance - Comments: able to don socks at EOB   Standing balance support: No upper extremity supported;During functional activity Standing balance-Leahy Scale: Poor Standing balance comment: up to heavy MinA for balance dynamically  Cognition  Arousal/Alertness: Awake/alert Behavior During Therapy: WFL for tasks assessed/performed Overall Cognitive Status: Within Functional Limits for tasks assessed                                 General Comments: A&Ox4 but still needs cues for functional application of sternal precautions      Exercises      General Comments        Pertinent Vitals/Pain Pain Assessment: Faces Faces Pain Scale: Hurts little more Pain Location: B feet in dependent position Pain Descriptors / Indicators: Aching;Discomfort Pain Intervention(s): Limited activity within patient's tolerance;Monitored during session;Repositioned    Home Living                      Prior Function            PT Goals (current goals can now be found in the care plan section) Acute Rehab PT Goals Patient Stated Goal: go home, pain control of LEs PT Goal Formulation: With patient Time For Goal Achievement: 07/21/20 Potential to Achieve Goals: Good Progress towards PT goals: Progressing toward goals    Frequency    Min 3X/week      PT Plan Current plan remains appropriate    Co-evaluation              AM-PAC PT "6 Clicks" Mobility   Outcome Measure  Help needed turning from your back to your side while in a flat bed without using bedrails?: None Help needed moving from lying on your back to sitting on the side of a flat bed without using bedrails?: None Help needed moving to and from a bed to a chair (including a wheelchair)?: A Little Help needed standing up from a chair using your arms (e.g., wheelchair or bedside chair)?: A Little Help needed to walk in hospital room?: A Little Help needed climbing 3-5 steps with a railing? : A Little 6 Click Score: 20    End of Session Equipment Utilized During Treatment: Gait belt Activity Tolerance: Patient tolerated treatment well Patient left: in bed;with call bell/phone within reach Nurse Communication: Mobility status;Other (comment)  (nausea/vomiting) PT Visit Diagnosis: Difficulty in walking, not elsewhere classified (R26.2);Pain;Muscle weakness (generalized) (M62.81) Pain - Right/Left:  (bilateral) Pain - part of body: Ankle and joints of foot     Time: 5825-1898 PT Time Calculation (min) (ACUTE ONLY): 46 min  Charges:  $Gait Training: 8-22 mins $Therapeutic Activity: 8-22 mins $Self Care/Home Management: 8-22                     Windell Norfolk, DPT, PN1   Supplemental Physical Therapist [place]    Pager [phone] Acute Rehab Office [phone]

## 2020-07-14 NOTE — Progress Notes (Signed)
  Progress Note    07/14/2020 2:32 PM 3 Days Post-Op  Subjective:  No complaints. She is eager to go home   Vitals:   07/14/20 1115 07/14/20 1133  BP: (!) 121/52 (!) 118/56  Pulse: 64 66  Resp: 18 18  Temp: 98.1 F (36.7 C) 98.4 F (36.9 C)  SpO2: 95% 98%   Physical Exam: Cardiac: regular Lungs: non labored Incisions:  Left AC incision is clean, dry and intact. Mild swelling around incision and ecchymosis. Good thrill. Left upper extremity edematous Extremities:  2+ radial pulses, bilateral hands warm. 5/5 grip strength. 2+ femoral pulses bilaterally. Distal pulses not palpable. Doppler bilateral PT signals     Abdomen:  Obese, soft, non tender Neurologic: alert and oriented  CBC    Component Value Date/Time   WBC 7.4 07/14/2020 0313   RBC 2.72 (L) 07/14/2020 0313   HGB 8.8 (L) 07/14/2020 0313   HGB 9.8 (L) 05/26/2020 1158   HCT 27.4 (L) 07/14/2020 0313   HCT 29.6 (L) 05/26/2020 1158   PLT 114 (L) 07/14/2020 0313   PLT 286 05/26/2020 1158   MCV 100.7 (H) 07/14/2020 0313   MCV 100 (H) 05/26/2020 1158   MCH 32.4 07/14/2020 0313   MCHC 32.1 07/14/2020 0313   RDW 15.2 07/14/2020 0313   RDW 14.1 05/26/2020 1158    BMET    Component Value Date/Time   NA 138 07/14/2020 0914   NA 139 05/26/2020 1158   K 3.9 07/14/2020 0914   CL 105 07/14/2020 0914   CO2 24 07/14/2020 0914   GLUCOSE 116 (H) 07/14/2020 0914   BUN 27 (H) 07/14/2020 0914   BUN 75 (H) 05/26/2020 1158   CREATININE 4.37 (H) 07/14/2020 0914   CALCIUM 8.2 (L) 07/14/2020 0914   GFRNONAA 11 (L) 07/14/2020 0914   GFRAA 16 (L) 05/26/2020 1158    INR    Component Value Date/Time   INR 1.2 07/11/2020 0304     Intake/Output Summary (Last 24 hours) at 07/14/2020 1432 Last data filed at 07/14/2020 1207 Gross per 24 hour  Intake 817.17 ml  Output 1225 ml  Net -407.83 ml     Assessment/Plan:  70 y.o. female is s/p Left 1st stage AV Fistula 3 Days Post-Op. Fistula is well appearing with good thrill.  No steal symptoms. She has adequate perfusion of her left upper extremity. Some ecchymosis present around incision. Mild edema of her left upper extremity. I have encouraged her to elevate her arm. She will have follow up of her fistula with an ultrasound in 4-6 weeks. 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 On Angiogram she has bilateral occluded SFAs. Her dry gangrene is presently stable as well as her symptoms. With just initiating dialysis this admission and also multiple medical comorbidities and having undergone multiple surgeries recently no revascularization is planned prior to her discharge. From a vascular standpoint she is stable for discharge home.  She will have follow up with Dr. Scot Dock in 1-2 weeks to discuss surgical revascularization and timing of this    Karoline Caldwell, PA-C [facility] [phone] 07/14/2020 2:32 PM

## 2020-07-15 ENCOUNTER — Other Ambulatory Visit (HOSPITAL_COMMUNITY): Payer: Self-pay | Admitting: Family Medicine

## 2020-07-15 DIAGNOSIS — N179 Acute kidney failure, unspecified: Secondary | ICD-10-CM | POA: Diagnosis not present

## 2020-07-15 DIAGNOSIS — R0602 Shortness of breath: Secondary | ICD-10-CM | POA: Insufficient documentation

## 2020-07-15 DIAGNOSIS — N2581 Secondary hyperparathyroidism of renal origin: Secondary | ICD-10-CM | POA: Insufficient documentation

## 2020-07-15 DIAGNOSIS — R52 Pain, unspecified: Secondary | ICD-10-CM | POA: Insufficient documentation

## 2020-07-15 DIAGNOSIS — T7840XA Allergy, unspecified, initial encounter: Secondary | ICD-10-CM | POA: Insufficient documentation

## 2020-07-15 DIAGNOSIS — D151 Benign neoplasm of heart: Secondary | ICD-10-CM | POA: Insufficient documentation

## 2020-07-15 DIAGNOSIS — L299 Pruritus, unspecified: Secondary | ICD-10-CM | POA: Insufficient documentation

## 2020-07-15 DIAGNOSIS — T782XXA Anaphylactic shock, unspecified, initial encounter: Secondary | ICD-10-CM | POA: Insufficient documentation

## 2020-07-15 LAB — CBC
HCT: 28.6 % — ABNORMAL LOW (ref 36.0–46.0)
Hemoglobin: 9 g/dL — ABNORMAL LOW (ref 12.0–15.0)
MCH: 31.6 pg (ref 26.0–34.0)
MCHC: 31.5 g/dL (ref 30.0–36.0)
MCV: 100.4 fL — ABNORMAL HIGH (ref 80.0–100.0)
Platelets: 127 10*3/uL — ABNORMAL LOW (ref 150–400)
RBC: 2.85 MIL/uL — ABNORMAL LOW (ref 3.87–5.11)
RDW: 15.5 % (ref 11.5–15.5)
WBC: 7.9 10*3/uL (ref 4.0–10.5)
nRBC: 0 % (ref 0.0–0.2)

## 2020-07-15 MED ORDER — CARVEDILOL 6.25 MG PO TABS: 6.2500 mg | ORAL_TABLET | Freq: Two times a day (BID) | ORAL | 0 refills | Status: DC

## 2020-07-15 MED ORDER — APIXABAN 5 MG PO TABS
5.0000 mg | ORAL_TABLET | Freq: Two times a day (BID) | ORAL | Status: DC
  Administered 2020-07-15: 5 mg via ORAL
  Filled 2020-07-15: qty 1

## 2020-07-15 MED FILL — CARVEDILOL 6.25 MG TABLET: 6.25 | 30 days supply | Qty: 60 | Fill #0

## 2020-07-15 NOTE — Progress Notes (Signed)
DISCHARGE NOTE HOME Tiffanie Blassingame to be discharged Home per MD order. Discussed prescriptions and follow up appointments with the patient. Prescriptions given to patient; medication list explained in detail. Patient verbalized understanding.  Skin clean, dry and intact without evidence of new skin break down, no evidence of skin tears noted. IV catheter discontinued intact. Site without signs and symptoms of complications. Dressing and pressure applied. Pt denies pain at the site currently. No complaints noted.  Patient free of lines, drains, and wounds.   An After Visit Summary (AVS) was printed and given to the patient. Patient escorted via wheelchair, and discharged home via private auto.  Berneta Levins, RN

## 2020-07-15 NOTE — Progress Notes (Signed)
ANTICOAGULATION CONSULT NOTE - Follow Up Consult  Pharmacy Consult for IV Heparin>>PO apixaban Indication: atrial fibrillation  Allergies  Allergen Reactions  . Penicillins Other (See Comments)    Childhood reaction.    Patient Measurements: Height: 5\' 3"  (160 cm) Weight: 70.5 kg (155 lb 6.8 oz) IBW/kg (Calculated) : 52.4  Heparin Dosing Weight: 67.3 kg  Vital Signs: Temp: 98.9 F (37.2 C) (02/04 0818) Temp Source: Oral (02/04 0818) BP: 121/46 (02/04 0818) Pulse Rate: 63 (02/04 0818)  Labs: Recent Labs    07/12/20 2048 07/13/20 0159 07/13/20 0159 07/14/20 0313 07/14/20 0900 07/14/20 0914 07/15/20 0422  HGB  --  8.3*   < > 8.8*  --   --  9.0*  HCT  --  25.2*  --  27.4*  --   --  28.6*  PLT  --  100*  --  114*  --   --  127*  HEPARINUNFRC 1.04*  --   --   --  0.36  --   --   CREATININE  --   --   --   --   --  4.37*  --    < > = values in this interval not displayed.    Estimated Creatinine Clearance: 11.8 mL/min (A) (by C-G formula based on SCr of 4.37 mg/dL (H)).  Assessment: 70 yr old female went to PCP for N/V and found to have "abnormal labs" and sent to ED, found to be in AKI on CKD, now dialysis dependent, to transitioned from apixaban for Afib to IV heparin (last apixaban taken 1/20 at 1000 AM).  Pt is S/P AVF placement 1/31, heparin resumed 2/1 pm per [facility].   Patient to transition from heparin back to apixaban per MD  Goal of Therapy:  Heparin level 0.3-0.7 units/ml  Monitor platelets by anticoagulation protocol: Yes   Plan:  Discontinue heparin drip .   Resume Eliquis 5mg  PO BID.   Kynadi Dragos A. Levada Dy, PharmD, BCPS, FNKF Clinical Pharmacist [place] Please utilize Amion for appropriate phone number to reach the unit pharmacist ([place])   07/15/2020 8:38 AM

## 2020-07-15 NOTE — Plan of Care (Signed)
  Problem: Education: Goal: Knowledge of General Education information will improve Description Including pain rating scale, medication(s)/side effects and non-pharmacologic comfort measures Outcome: Progressing   

## 2020-07-15 NOTE — Discharge Summary (Signed)
Physician Discharge Summary  Therisa Mennella PPJ:[id number] DOB: August 10, 1950 DOA: 06/30/2020  PCP: Gala Lewandowsky, MD  Admit date: 06/30/2020 Discharge date: 07/15/2020 30 Day Unplanned Readmission Risk Score   Flowsheet Row ED to Hosp-Admission (Current) from 06/30/2020 in [facility]  30 Day Unplanned Readmission Risk Score (%) 25.52 Filed at 07/15/2020 0801     This score is the patient's risk of an unplanned readmission within 30 days of being discharged (0 -100%). The score is based on dignosis, age, lab data, medications, orders, and past utilization.   Low:  0-14.9   Medium: 15-21.9   High: 22-29.9   Extreme: 30 and above         Admitted From: Home Disposition: Home  Recommendations for Outpatient Follow-up:  1. Follow up with PCP in 1-2 weeks 2. Follow-up with dialysis outpatient, starting this Saturday/tomorrow 3. Follow-up with vascular surgery within 2 weeks, date has been scheduled for you 4. Please obtain BMP/CBC in one week 5. Please follow up with your PCP on the following pending results: Unresulted Labs (From admission, onward)          Start     Ordered   07/13/20 0500  CBC  Daily,   R     Question:  Specimen collection method  Answer:  Lab=Lab collect   07/12/20 1202   07/10/20 1215  SARS Coronavirus 2 by RT PCR (hospital order, performed in [place] hospital lab) Nasopharyngeal Nasopharyngeal Swab  (Tier 2 - Symptomatic/asymptomatic with Precautions )  ONCE - STAT,   STAT       Question Answer Comment  Is this test for diagnosis or screening Screening   Symptomatic for COVID-19 as defined by CDC No   Hospitalized for COVID-19 No   Admitted to ICU for COVID-19 No   Previously tested for COVID-19 Yes   Resident in a congregate (group) care setting No   Employed in healthcare setting No   Pregnant No   Has patient completed COVID vaccination(s) (2 doses of Pfizer/Moderna 1 dose of [employer]) Unknown      07/10/20 [place]: Yes Equipment/Devices: Rolling walker and 3 in 1 commode  Discharge Condition: Stable CODE STATUS: Full code Diet recommendation: Renal  Subjective: Seen and examined.  No complaints other than chronic but improving bilateral feet toe pain.  Excited to go home.  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  During her prior hospital stay at [facility] patient had worsening renal function and lower extremity ischemic change and postoperative A. fib. 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 Was instructed to repeat and the repeat on showed worsening and was advised to come to the ER. over the last 2 weeks patient has been worsening nausea vomiting unable to keep anything but denies any abdominal pain or diarrhea.  Patient was subsequently admitted to hospital service with acute kidney injury on chronic kidney disease stage III b which did not improve with conservative measures so subsequently patient underwent TDC on 126 and dialysis was started.  Nephrology was on board.  Patient's nausea and vomiting improved after dialysis as this was likely secondary to uremia.  She also had some hyperkalemia which improved with dialysis as well.  For her A. fib, her  amiodarone and Coreg as well as Eliquis were continued.  She was found to have bilateral PAD, ischemic changes and dry gangrene on most of the toes and bilateral feet.  For that vascular surgery was consulted and patient underwent arteriogram on 07/08/2020 and was found to have atherosclerosis of bilateral lower extremities with gangrene  And bilateral bilateral occluded SFAs.  Her gangrene was watched conservatively by vascular surgery.  Per vascular surgery, due to the fact that patient has just been initiated on dialysis and she has  multiple comorbidities so they recommended continued conservative management without any intervention and they have a scheduled patient to follow-up with them in 1 to 2 weeks with Dr. Scot Dock.  Of note, patient also underwent left first stage brachiocephalic AV fistula by vascular surgery on 07/11/2020.  During these procedures, patient was maintained on heparin.  She is being switched back to Eliquis and will be discharged on that as well.  Patient has been cleared by nephrology as well as vascular surgery for discharge.  Discharge Diagnoses:  Principal Problem:   ARF (acute renal failure) (HCC) Active Problems:   Left atrial myxoma   AAA (abdominal aortic aneurysm) without rupture (HCC)   PAF (paroxysmal atrial fibrillation) (HCC)   Anemia   Essential hypertension   DM (diabetes mellitus), type 2 with renal complications [facility])    Discharge Instructions   Allergies as of 07/15/2020      Reactions   Penicillins Other (See Comments)   Childhood reaction.      Medication List    STOP taking these medications   furosemide 20 MG tablet Commonly known as: LASIX   glimepiride 1 MG tablet Commonly known as: AMARYL     TAKE these medications   acetaminophen 500 MG tablet Commonly known as: TYLENOL Take 500-1,000 mg by mouth every 6 (six) hours as needed (for pain.).   amiodarone 200 MG tablet Commonly known as: PACERONE Take 200 mg by mouth daily.   amLODipine 5 MG tablet Commonly known as: NORVASC Take 5 mg by mouth daily.   apixaban 5 MG Tabs tablet Commonly known as: Eliquis Take 1 tablet (5 mg total) by mouth 2 (two) times daily.   aspirin EC 81 MG tablet Take 81 mg by mouth daily. Swallow whole.   atorvastatin 20 MG tablet Commonly known as: LIPITOR Take 20 mg by mouth daily.   carvedilol 6.25 MG tablet Commonly known as: COREG Take 1 tablet (6.25 mg total) by mouth 2 (two) times daily with a meal. What changed:   medication strength  how much to take    fluticasone 50 MCG/ACT nasal spray Commonly known as: FLONASE Place 2 sprays into both nostrils as needed for allergies or rhinitis.   folic acid 801 MCG tablet Commonly known as: FOLVITE Take 800 mcg by mouth daily.   glucose blood test strip 1 each by Other route as needed. Use as instructed   montelukast 10 MG tablet Commonly known as: SINGULAIR Take 10 mg by mouth daily as needed (allergies.).   oxyCODONE 5 MG immediate release tablet Commonly known as: Oxy IR/ROXICODONE Take 5-10 mg by mouth every 6 (six) hours as needed for moderate pain.   vitamin C 1000 MG tablet Take 1,000 mg by mouth every evening.   Vitamin D3 25 MCG tablet Commonly known as: Vitamin D Take 2,000 Units by mouth every evening.            Durable Medical Equipment  (From admission, onward)  Start     Ordered   07/15/20 0807  For home use only DME Bedside commode  Once       Question:  Patient needs a bedside commode to treat with the following condition  Answer:  Balance problem   07/15/20 0806   07/15/20 0807  For home use only DME Walker rolling  Once       Question Answer Comment  Walker: With [place]   Patient needs a walker to treat with the following condition Ischemic toe      07/15/20 0806          Follow-up Information    Angelia Mould, MD Follow up in 1 week(s).   Specialties: Vascular Surgery, Cardiology Why: the office will contact patient with her follow up appointment Contact information: [place] [address] [phone]        Gala Lewandowsky, MD Follow up in 1 week(s).   Specialty: Family Medicine Contact information: [address] [place] [address] [phone]        Leonie Man, MD .   Specialty: Cardiology Contact information: [address] [place] [address] [phone]              Allergies  Allergen Reactions  . Penicillins Other (See Comments)    Childhood reaction.     Consultations: Nephrology and vascular surgery   Procedures/Studies: PERIPHERAL VASCULAR CATHETERIZATION  Result Date: 07/08/2020 PATIENT: Cristal Qadir      MRN: [id number] DOB: 1951-05-10    DATE OF PROCEDURE: 07/08/2020 INDICATIONS:  Elener Custodio is a 70 y.o. female who presented with ischemic changes to both feet. 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 Therefore we proceeded with arteriography. PROCEDURE:  1. Conscious sedation 2. Ultrasound-guided access to the left common femoral artery 3. Aortogram with bilateral iliac arteriogram and bilateral lower extremity runoff 4. Mynx closure of left common femoral artery SURGEON: Judeth Cornfield. Scot Dock, MD, FACS ANESTHESIA: Local with sedation EBL: Minimal TECHNIQUE: The patient was brought to the peripheral vascular lab and was sedated. The period of conscious sedation was 28 minutes.  During that time period, I was present face-to-face 100% of the time.  The patient was administered 1 mg of Versed and 50 mcg of fentanyl. The patient's heart rate, blood pressure, and oxygen saturation were monitored by the nurse continuously during the procedure. Both groins were prepped and draped in the usual sterile fashion.  Under ultrasound guidance, after the skin was anesthetized, I cannulated the left common femoral artery with a micropuncture needle and a micropuncture sheath was introduced over a wire.  This was exchanged for a 5 [place] sheath over a Bentson wire.  By ultrasound the femoral artery was patent. A real-time image was obtained and sent to the server. A pigtail catheter was positioned at the L1 vertebral body and flush aortogram obtained. The catheter was in position above the aortic bifurcation and bilateral lower extremity runoff films were obtained. The catheter was then removed over a wire. The sheath was flushed. The minx closure device was deployed without difficulty and there was good  hemostasis. FINDINGS: 1. There are 2 renal arteries on the right and single renal artery on the left. The patient has a known pararenal aneurysm. 2. On the right side, the common iliac, external iliac, and hypogastric arteries are patent. The common femoral and deep femoral artery are patent. The superficial femoral artery is  occluded at its origin with reconstitution of the above-knee popliteal artery and two-vessel runoff via the peroneal and posterior tibial arteries. The anterior tibial artery on the right is occluded. 3. On the left side the common, internal, and external iliac arteries are patent. The common femoral and deep femoral artery are patent. The superficial femoral artery is occluded at its origin. There is reconstitution of the above-knee popliteal artery with two-vessel runoff via the posterior tibial and peroneal arteries. The anterior tibial artery on the left is occluded. CLINICAL NOTE: This patient has bilateral superficial femoral artery occlusions. Currently her toes are stable. If she developed worsening wounds on her feet her best chance for limb salvage would be staged bilateral femoral to above-knee popliteal artery bypass graft. Currently given that she is just started dialysis with multiple medical comorbidities we will not plan revascularization unless the wounds progress. Deitra Mayo, MD, FACS [facility] DATE OF DICTATION:   07/08/2020  IR Fluoro Guide CV Line Right  Result Date: 07/06/2020 INDICATION: 70 year old female with acute on chronic kidney disease in need of hemodialysis. She presents for tunneled hemodialysis catheter placement. EXAM: TUNNELED CENTRAL VENOUS HEMODIALYSIS CATHETER PLACEMENT WITH ULTRASOUND AND FLUOROSCOPIC GUIDANCE MEDICATIONS: 1 g vancomycin. The antibiotic was given in an appropriate time interval prior to skin puncture. ANESTHESIA/SEDATION: Moderate (conscious) sedation was employed during this procedure. A  total of Versed 1 mg and Fentanyl 50 mcg was administered intravenously. Moderate Sedation Time: 20 minutes. The patient's level of consciousness and vital signs were monitored continuously by radiology nursing throughout the procedure under my direct supervision. FLUOROSCOPY TIME:  Fluoroscopy Time: 0 minutes 54 seconds (4 mGy). COMPLICATIONS: None immediate. PROCEDURE: Informed written consent was obtained from the patient after a discussion of the risks, benefits, and alternatives to treatment. Questions regarding the procedure were encouraged and answered. The right neck and chest were prepped with chlorhexidine in a sterile fashion, and a sterile drape was applied covering the operative field. Maximum barrier sterile technique with sterile gowns and gloves were used for the procedure. A timeout was performed prior to the initiation of the procedure. 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 The right subclavian vein was then interrogated with ultrasound using a supraclavicular approach was found to be widely patent. An image was obtained and stored for the medical record. After creating a small venotomy incision, a micropuncture kit was utilized to access the right subclavian vein under direct, real-time ultrasound guidance after the overlying soft tissues were anesthetized with 1% lidocaine with epinephrine. Ultrasound image documentation was performed. The microwire was kinked to measure appropriate catheter length. A stiff Glidewire was advanced to the level of the IVC and the micropuncture sheath was exchanged for a peel-away sheath. A palindrome tunneled hemodialysis catheter measuring 19 cm from tip to cuff was tunneled in a retrograde fashion from the anterior chest wall to the venotomy incision. The catheter was then placed through the peel-away sheath with tips ultimately positioned within the superior aspect of the right atrium. Final  catheter positioning was confirmed and documented with a spot radiographic image. The catheter aspirates and flushes normally. The catheter was flushed with appropriate volume heparin dwells. The catheter exit site was secured with a 0-Prolene retention suture. The venotomy incision was closed with an interrupted 4-0 Vicryl, Dermabond and Steri-strips. Dressings were applied. The patient tolerated the procedure well without immediate post procedural complication. IMPRESSION: Successful placement of 19 cm tip to  cuff tunneled hemodialysis catheter via the right subclavian (supraclavicular approach) vein with tips terminating within the superior aspect of the right atrium. The catheter is ready for immediate use. Electronically Signed   By: Jacqulynn Cadet M.D.   On: 07/06/2020 10:39   IR US Guide Vasc Access Right  Result Date: 07/06/2020 INDICATION: 70 year old female with acute on chronic kidney disease in need of hemodialysis. She presents for tunneled hemodialysis catheter placement. EXAM: TUNNELED CENTRAL VENOUS HEMODIALYSIS CATHETER PLACEMENT WITH ULTRASOUND AND FLUOROSCOPIC GUIDANCE MEDICATIONS: 1 g vancomycin. The antibiotic was given in an appropriate time interval prior to skin puncture. ANESTHESIA/SEDATION: Moderate (conscious) sedation was employed during this procedure. A total of Versed 1 mg and Fentanyl 50 mcg was administered intravenously. Moderate Sedation Time: 20 minutes. The patient's level of consciousness and vital signs were monitored continuously by radiology nursing throughout the procedure under my direct supervision. FLUOROSCOPY TIME:  Fluoroscopy Time: 0 minutes 54 seconds (4 mGy). COMPLICATIONS: None immediate. PROCEDURE: Informed written consent was obtained from the patient after a discussion of the risks, benefits, and alternatives to treatment. Questions regarding the procedure were encouraged and answered. The right neck and chest were prepped with chlorhexidine in a sterile  fashion, and a sterile drape was applied covering the operative field. Maximum barrier sterile technique with sterile gowns and gloves were used for the procedure. A timeout was performed prior to the initiation of the procedure. 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 The right subclavian vein was then interrogated with ultrasound using a supraclavicular approach was found to be widely patent. An image was obtained and stored for the medical record. After creating a small venotomy incision, a micropuncture kit was utilized to access the right subclavian vein under direct, real-time ultrasound guidance after the overlying soft tissues were anesthetized with 1% lidocaine with epinephrine. Ultrasound image documentation was performed. The microwire was kinked to measure appropriate catheter length. A stiff Glidewire was advanced to the level of the IVC and the micropuncture sheath was exchanged for a peel-away sheath. A palindrome tunneled hemodialysis catheter measuring 19 cm from tip to cuff was tunneled in a retrograde fashion from the anterior chest wall to the venotomy incision. The catheter was then placed through the peel-away sheath with tips ultimately positioned within the superior aspect of the right atrium. Final catheter positioning was confirmed and documented with a spot radiographic image. The catheter aspirates and flushes normally. The catheter was flushed with appropriate volume heparin dwells. The catheter exit site was secured with a 0-Prolene retention suture. The venotomy incision was closed with an interrupted 4-0 Vicryl, Dermabond and Steri-strips. Dressings were applied. The patient tolerated the procedure well without immediate post procedural complication. IMPRESSION: Successful placement of 19 cm tip to cuff tunneled hemodialysis catheter via the right subclavian (supraclavicular approach) vein with tips terminating within the  superior aspect of the right atrium. The catheter is ready for immediate use. Electronically Signed   By: Jacqulynn Cadet M.D.   On: 07/06/2020 10:39   DG CHEST PORT 1 VIEW  Result Date: 07/01/2020 CLINICAL DATA:  Vomiting EXAM: PORTABLE CHEST 1 VIEW COMPARISON:  June 26, 2019 FINDINGS: The patient is status post prior median sternotomy. The heart size is enlarged but stable. There is no pneumothorax. No large pleural effusion. Aortic calcifications are noted. There is no acute osseous abnormality. There is a rounded density projecting over the left lower lung zone. IMPRESSION: 1. No acute cardiopulmonary process. 2. Rounded  density projecting over the left lower lung zone is likely a nipple shadow. However, a follow-up two-view chest x-ray in 4-6 weeks is recommended to confirm stability or resolution of this finding. Electronically Signed   By: Constance Holster M.D.   On: 07/01/2020 06:14   VAS [place] ABI WITH/WO TBI  Result Date: 07/01/2020 LOWER EXTREMITY DOPPLER STUDY Indications: Gangrene, and peripheral artery disease. High Risk Factors: Hypertension, Diabetes, current smoker.  Comparison Study: No previous exam Performing Technologist: Vonzell Schlatter RVT  Examination Guidelines: A complete evaluation includes at minimum, Doppler waveform signals and systolic blood pressure reading at the level of bilateral brachial, anterior tibial, and posterior tibial arteries, when vessel segments are accessible. Bilateral testing is considered an integral part of a complete examination. Photoelectric Plethysmograph (PPG) waveforms and toe systolic pressure readings are included as required and additional duplex testing as needed. Limited examinations for reoccurring indications may be performed as noted.  ABI Findings: +--------+------------------+-----+----------+--------+ Right   Rt Pressure (mmHg)IndexWaveform  Comment  +--------+------------------+-----+----------+--------+ [id number]                                        +--------+------------------+-----+----------+--------+ PTA     65                0.60 monophasic         +--------+------------------+-----+----------+--------+ DP      65                0.60 monophasic         +--------+------------------+-----+----------+--------+ +--------+------------------+-----+----------+-------+ Left    Lt Pressure (mmHg)IndexWaveform  Comment +--------+------------------+-----+----------+-------+ [id number]                                      +--------+------------------+-----+----------+-------+ PTA     66                0.61 monophasic        +--------+------------------+-----+----------+-------+ DP      58                0.53 monophasic        +--------+------------------+-----+----------+-------+  Summary: Right: Resting right ankle-brachial index indicates moderate right lower extremity arterial disease. Left: Resting left ankle-brachial index indicates moderate left lower extremity arterial disease.  *See table(s) above for measurements and observations.  Electronically signed by Deitra Mayo MD on 07/01/2020 at 6:21:05 PM.   Final    CT RENAL STONE STUDY  Result Date: 07/01/2020 CLINICAL DATA:  Flank pain EXAM: CT ABDOMEN AND PELVIS WITHOUT CONTRAST TECHNIQUE: Multidetector CT imaging of the abdomen and pelvis was performed following the standard protocol without IV contrast. COMPARISON:  None. FINDINGS: Lower chest: The visualized heart size within normal limits. No pericardial fluid/thickening. No hiatal hernia. There is patchy airspace consolidation seen at the posterior right lung base Hepatobiliary: Although limited due to the lack of intravenous contrast, normal in appearance without gross focal abnormality. Scattered calcified gallstones are present. There are 2 layering within the gallbladder fundus the largest measuring 9 mm. Pancreas:  Unremarkable.  No surrounding inflammatory changes. Spleen:  Normal in size. Although limited due to the lack of intravenous contrast, normal in appearance. Adrenals/Urinary Tract: Both adrenal glands appear normal. There is a punctate nonobstructing renal calculi in the upper pole of the right kidney. No left-sided renal or collecting  system calculi are noted. Bladder is unremarkable. Stomach/Bowel: The stomach, small bowel, and colon are normal in appearance. No inflammatory changes or obstructive findings. Scattered colonic diverticula are noted. Appendix is normal. Vascular/Lymphatic: There are no enlarged abdominal or pelvic lymph nodes. 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 The maximum dimension of the aorta with focal outpouching/pseudoaneurysm at the level of L3 measures 4.9 cm. The infrarenal abdominal aorta measures maximum diameter 4.7 cm which is also unchanged from the prior exam. There is scattered atherosclerosis seen throughout. Reproductive: For Other: No evidence of abdominal wall mass or hernia. Musculoskeletal: No acute or significant osseous findings. IMPRESSION: 1. Patchy airspace consolidation at the posterior right lung base which could be due to atelectasis or infectious etiology. 2. Nonobstructing punctate right renal calculi. 3. Cholelithiasis 4. 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 This is unchanged from prior exam dating back to June 26, 2019. 5. Aortic Atherosclerosis (ICD10-I70.0). Electronically Signed   By: Prudencio Pair M.D.   On: 07/01/2020 02:05   VAS [place] UPPER EXT VEIN MAPPING (PRE-OP AVF)  Result Date: 07/06/2020 UPPER EXTREMITY VEIN MAPPING  Indications: Pre-access. Comparison Study: No prior exams Performing Technologist: Rogelia Rohrer  Examination Guidelines: A  complete evaluation includes B-mode imaging, spectral Doppler, color Doppler, and power Doppler as needed of all accessible portions of each vessel. Bilateral testing is considered an integral part of a complete examination. Limited examinations for reoccurring indications may be performed as noted. +-----------------+-------------+----------+---------+ Right Cephalic   Diameter (cm)Depth (cm)Findings  +-----------------+-------------+----------+---------+ Prox upper arm       0.35        0.87             +-----------------+-------------+----------+---------+ Mid upper arm        0.37        0.49             +-----------------+-------------+----------+---------+ Dist upper arm       0.45        0.52             +-----------------+-------------+----------+---------+ Antecubital fossa    0.55        0.23   branching +-----------------+-------------+----------+---------+ Prox forearm         0.36        0.56             +-----------------+-------------+----------+---------+ Mid forearm          0.33        0.51             +-----------------+-------------+----------+---------+ Dist forearm         0.32        0.33             +-----------------+-------------+----------+---------+ Wrist                0.28        0.29             +-----------------+-------------+----------+---------+ +-----------------+-------------+----------+---------+ Right Basilic    Diameter (cm)Depth (cm)Findings  +-----------------+-------------+----------+---------+ Shoulder             0.45                         +-----------------+-------------+----------+---------+ Prox upper arm       0.42                         +-----------------+-------------+----------+---------+  Mid upper arm        0.40                         +-----------------+-------------+----------+---------+ Dist upper arm       0.50               branching  +-----------------+-------------+----------+---------+ Antecubital fossa    0.57               branching +-----------------+-------------+----------+---------+ Prox forearm         0.37                         +-----------------+-------------+----------+---------+ Mid forearm          0.36                         +-----------------+-------------+----------+---------+ Distal forearm       0.28               branching +-----------------+-------------+----------+---------+ Wrist                0.20                         +-----------------+-------------+----------+---------+ +-----------------+-------------+----------+---------------------+ Left Cephalic    Diameter (cm)Depth (cm)      Findings        +-----------------+-------------+----------+---------------------+ Prox upper arm       0.23        0.50                         +-----------------+-------------+----------+---------------------+ Mid upper arm        0.17        0.58                         +-----------------+-------------+----------+---------------------+ Dist upper arm       0.40        0.39         Thrombus        +-----------------+-------------+----------+---------------------+ Antecubital fossa    0.45        0.30         Thrombus        +-----------------+-------------+----------+---------------------+ Prox forearm         0.34        0.50         branching       +-----------------+-------------+----------+---------------------+ Mid forearm          0.34        0.73                         +-----------------+-------------+----------+---------------------+ Dist forearm         0.37        0.47                         +-----------------+-------------+----------+---------------------+ Wrist                                   not visualized and IV +-----------------+-------------+----------+---------------------+ +-----------------+-------------+----------+---------+ Left Basilic      Diameter (cm)Depth (cm)Findings  +-----------------+-------------+----------+---------+ Shoulder             0.93                         +-----------------+-------------+----------+---------+  Prox upper arm       0.37                         +-----------------+-------------+----------+---------+ Mid upper arm        0.35               branching +-----------------+-------------+----------+---------+ Dist upper arm       0.32                         +-----------------+-------------+----------+---------+ Antecubital fossa    0.36               branching +-----------------+-------------+----------+---------+ Prox forearm         0.23                         +-----------------+-------------+----------+---------+ Mid forearm          0.19                         +-----------------+-------------+----------+---------+ Distal forearm       0.15                         +-----------------+-------------+----------+---------+ Wrist                0.12                         +-----------------+-------------+----------+---------+ Summary:  Left: SVT of cephalic vein in area of distal upper arm and AC fossa       - previous site of IV. *See table(s) above for measurements and observations.  Diagnosing physician: Harold Barban MD Electronically signed by Harold Barban MD on 07/06/2020 at 9:36:31 PM.    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 Limitations: Air/bowel gas and difficult visibility due to prominent overlying veins. Comparison Study: No prior studies. Performing Technologist: Darlin Coco, RDMS  Examination Guidelines: A complete evaluation includes B-mode imaging, spectral Doppler, color Doppler, and power Doppler as needed of all accessible portions of each vessel. Bilateral testing is considered an integral  part of a complete examination. Limited examinations for reoccurring indications may be performed as noted.  Duplex Findings: +--------------------+--------+--------+------+--------+ Mesenteric          PSV cm/sEDV cm/sPlaqueComments +--------------------+--------+--------+------+--------+ Aorta Mid              97                          +--------------------+--------+--------+------+--------+ Celiac Artery Origin  268                          +--------------------+--------+--------+------+--------+ SMA Origin            236                          +--------------------+--------+--------+------+--------+    +------------------+--------+--------+-------+ Right Renal ArteryPSV cm/sEDV cm/sComment +------------------+--------+--------+-------+ Origin               64      16           +------------------+--------+--------+-------+ Proximal  88      11           +------------------+--------+--------+-------+ Mid                  81      15           +------------------+--------+--------+-------+ Distal               62      10           +------------------+--------+--------+-------+ +-----------------+--------+--------+-------+ Left Renal ArteryPSV cm/sEDV cm/sComment +-----------------+--------+--------+-------+ Proximal            64      11           +-----------------+--------+--------+-------+ Mid                 59      12           +-----------------+--------+--------+-------+ Distal              49      12           +-----------------+--------+--------+-------+ +------------+--------+--------+----+-----------+--------+--------+----+ Right KidneyPSV cm/sEDV cm/sRI  Left KidneyPSV cm/sEDV cm/sRI   +------------+--------+--------+----+-----------+--------+--------+----+ Upper Pole  19      6       0.68Upper Pole                      +------------+--------+--------+----+-----------+--------+--------+----+ Mid          23      6       0.70Mid        37      8       0.77 +------------+--------+--------+----+-----------+--------+--------+----+ Lower Pole  21      7       0.69Lower Pole 41      8       0.79 +------------+--------+--------+----+-----------+--------+--------+----+ Hilar       47      8       0.83Hilar      33      9       0.74 +------------+--------+--------+----+-----------+--------+--------+----+ +------------------+-----+------------------+-----+ Right Kidney           Left Kidney             +------------------+-----+------------------+-----+ RAR                    RAR                     +------------------+-----+------------------+-----+ RAR (manual)      0.91 RAR (manual)      0.66  +------------------+-----+------------------+-----+ Cortex                 Cortex                  +------------------+-----+------------------+-----+ Cortex thickness       Corex thickness         +------------------+-----+------------------+-----+ Kidney length (cm)10.43Kidney length (cm)10.10 +------------------+-----+------------------+-----+  Summary: Renal:  Right: No evidence of right renal artery stenosis. RRV flow present.        Normal size right kidney. Left:  No evidence of left renal artery stenosis. LRV flow present.        Normal size of left kidney. Abnormal left Resisitve Index. Mesenteric: Normal Superior Mesenteric artery findings. 70 to 99% stenosis in the celiac artery. AAA identified on previous CT appreciated on today's examination.  *See table(s) above for measurements and observations.  Diagnosing  physician: Deitra Mayo MD  Electronically signed by Deitra Mayo MD on 07/02/2020 at 5:50:27 PM.    Final       Discharge Exam: Vitals:   07/14/20 2043 07/15/20 0455  BP: (!) 120/48 (!) 126/53  Pulse: (!) 59 (!) 57  Resp: 20 20  Temp: 98 F (36.7 C) 98.5 F (36.9 C)  SpO2: 96% 96%   Vitals:   07/14/20 1609 07/14/20 2043 07/15/20  0455 07/15/20 0500  BP: (!) 124/49 (!) 120/48 (!) 126/53   Pulse: 60 (!) 59 (!) 57   Resp: 18 20 20    Temp: 99 F (37.2 C) 98 F (36.7 C) 98.5 F (36.9 C)   TempSrc:  Oral    SpO2: 95% 96% 96%   Weight:    70.5 kg  Height:        General: Pt is alert, awake, not in acute distress Cardiovascular: RRR, S1/S2 +, no rubs, no gallops Respiratory: CTA bilaterally, no wheezing, no rhonchi Abdominal: Soft, NT, ND, bowel sounds + Extremities: no edema, but multiple ischemic toes as in pictures below.        The results of significant diagnostics from this hospitalization (including imaging, microbiology, ancillary and laboratory) are listed below for reference.     Microbiology: No results found for this or any previous visit (from the past 240 hour(s)).   Labs: BNP (last 3 results) No results for input(s): BNP in the last 8760 hours. Basic Metabolic Panel: Recent Labs  Lab 07/09/20 0432 07/10/20 0417 07/11/20 0304 07/12/20 0809 07/14/20 0914  NA 138 139 137 138 138  K 4.1 4.1 4.0 4.2 3.9  CL 104 106 103 103 105  CO2 23 22 25 25 24   GLUCOSE 103* 86 119* 124* 116*  BUN 40* 38* 24* 30* 27*  CREATININE 4.88* 5.00* 3.81* 4.69* 4.37*  CALCIUM 8.4* 8.6* 8.2* 8.4* 8.2*  PHOS 4.2 4.5  --  4.1 4.1   Liver Function Tests: Recent Labs  Lab 07/09/20 0432 07/10/20 0417 07/12/20 0809 07/14/20 0914  ALBUMIN 2.2* 2.1* 2.1* 1.9*   No results for input(s): LIPASE, AMYLASE in the last 168 hours. No results for input(s): AMMONIA in the last 168 hours. CBC: Recent Labs  Lab 07/11/20 0304 07/12/20 0809 07/13/20 0159 07/14/20 0313 07/15/20 0422  WBC 7.3 7.0 6.2 7.4 7.9  HGB 8.5* 8.5* 8.3* 8.8* 9.0*  HCT 25.6* 27.0* 25.2* 27.4* 28.6*  MCV 99.6 101.9* 98.8 100.7* 100.4*  PLT 115* 122* 100* 114* 127*   Cardiac Enzymes: No results for input(s): CKTOTAL, CKMB, CKMBINDEX, TROPONINI in the last 168 hours. BNP: Invalid input(s): POCBNP CBG: Recent Labs  Lab 07/13/20 0645  07/13/20 1113 07/13/20 1726 07/14/20 1127 07/14/20 1609  GLUCAP 97 114* 85 101* 123*   D-Dimer No results for input(s): DDIMER in the last 72 hours. Hgb A1c No results for input(s): HGBA1C in the last 72 hours. Lipid Profile No results for input(s): CHOL, HDL, LDLCALC, TRIG, CHOLHDL, LDLDIRECT in the last 72 hours. Thyroid function studies No results for input(s): TSH, T4TOTAL, T3FREE, THYROIDAB in the last 72 hours.  Invalid input(s): FREET3 Anemia work up Recent Labs    07/13/20 0159  FERRITIN 182  TIBC 176*  IRON 37   Urinalysis    Component Value Date/Time   COLORURINE YELLOW 07/02/2020 1635   APPEARANCEUR HAZY (A) 07/02/2020 1635   LABSPEC 1.011 07/02/2020 1635   PHURINE 5.0 07/02/2020 1635   GLUCOSEU >=500 (A) 07/02/2020 [place] 07/02/2020 1635  BILIRUBINUR NEGATIVE 07/02/2020 [place] 07/02/2020 1635   PROTEINUR NEGATIVE 07/02/2020 1635   NITRITE NEGATIVE 07/02/2020 1635   LEUKOCYTESUR TRACE (A) 07/02/2020 1635   Sepsis Labs Invalid input(s): PROCALCITONIN,  WBC,  LACTICIDVEN Microbiology No results found for this or any previous visit (from the past 240 hour(s)).   Time coordinating discharge: Over 30 minutes  SIGNED:   Darliss Cheney, MD  [facility] 07/15/2020, 8:10 AM  If 7PM-7AM, please contact night-coverage [email or web address]

## 2020-07-15 NOTE — Progress Notes (Signed)
[place] clinic has notified Renal Navigator of request that patient be there today by 2:00pm to sign paperwork or 6:45am tomorrow before treatment to sign paperwork. Navigator attempted to call patient's daughter to update, but she did not answer. Message left requesting a returned call.  Alphonzo Cruise, [place] Renal Navigator [phone]

## 2020-07-15 NOTE — TOC Transition Note (Signed)
Transition of Care [facility]) - CM/SW Discharge Note   Patient Details  Name: Kathryn Hancock MRN: [id number] Date of Birth: [birth year]  Transition [facility] Care [facility]) CM/SW Contact:  Bartholomew Crews, RN Phone Number: [phone] 07/15/2020, 1:11 PM   Clinical Narrative:     [place] unable to accept referral d/t staffing. Referral accepted by [facility] Telephone call to patient's daughter, [name] - message left with accepting home health agency.   Final next level of care: [place] Barriers to Discharge: No Barriers Identified   Patient Goals and CMS Choice Patient states their goals for this hospitalization and ongoing recovery are:: return home CMS Medicare.gov Compare Post Acute Care list provided to:: Patient Choice offered to / list presented to : Patient  Discharge Placement                       Discharge Plan and Services In-house Referral: NA Discharge Planning Services: CM Consult Post Acute Care Choice: Home Health,Durable Medical Equipment          DME Arranged: 3-N-1,[place] DME Agency: [employer] Date DME Agency Contacted: 07/15/20 Time DME Agency Contacted: [phone] Representative spoke with at DME Agency: Sheila Rodeo: PT,OT [place]: [place] Date [place]: 07/15/20 Time [place]: 1310 Representative spoke with at [place]: [place] ([place]) Interventions     Readmission Risk Interventions No flowsheet data found.

## 2020-07-15 NOTE — TOC Progression Note (Signed)
Transition of Care [facility]) - Progression Note    Patient Details  Name: [name] MRN: [id number] Date of Birth: Feb 14, 1951  Transition of Care [facility]) CM/SW Contact  Bartholomew Crews, RN Phone Number: [phone] 07/15/2020, 12:19 PM  Clinical Narrative:     Spoke with patient and daughter, [name], at the bedside. Patient unable to get RW through insurance d/t having recently gotten a rollator. Discussed [place] agency choice. Pending referral with [facility].   Expected Discharge Plan: [place] Barriers to Discharge: No Barriers Identified  Expected Discharge Plan and Services Expected Discharge Plan: [place] In-house Referral: NA Discharge Planning Services: CM Consult Post Acute Care Choice: [place] arrangements for the past 2 months: Single Family Home Expected Discharge Date: 07/15/20               DME Arranged: 3-N-1,[place] DME Agency: [employer] Date DME Agency Contacted: 07/15/20 Time DME Agency Contacted: [phone] Representative spoke with at DME Agency: Sheila Fletcher: PT,OT           Social Determinants of Health (SDOH) Interventions    Readmission Risk Interventions No flowsheet data found.

## 2020-07-15 NOTE — Progress Notes (Signed)
Patient ID: Kathryn Hancock, female   DOB: [birth year], 70 y.o.   MRN: [id number] [facility] Progress Note   Assessment/ Plan:   1.  Bilateral lower extremity peripheral vascular disease with gangrenous changes: Has been seen by vascular surgery with plans for discussions of revascularization in 2 weeks following discharge. 2.  End-stage renal disease: Following progressive chronic kidney disease/acute kidney injury after cardiac surgery possibly related to atheroembolic phenomenon.  Started on hemodialysis on 1/27 and had her hemodialysis done yesterday.  Plans in place for possible discharge today to resume outpatient hemodialysis tomorrow at [facility] kidney center on a TTS schedule via right IJ TDC.  She had a left upper arm basilic vein transposition done on 07/11/2020 with postop follow-up noted. 3. Anemia: Status post PRBC transfusion earlier in admission, currently on ESA and intravenous iron. 4. CKD-MBD: Calcium level acceptable with phosphorus level controlled on hemodialysis without binder.  PTH level low and without indication for vitamin D receptor analog. 5. Nutrition: Continue renal diet with fluid restriction and renal multivitamin. 6. Hypertension: Blood pressure under decent control, continue to monitor with UF on HD.  Subjective:   Reports to be feeling well, denies any chest pain or shortness of breath and has been able to ambulate around room without problems.   Objective:   BP (!) 121/46   Pulse 63   Temp 98.5 F (36.9 C)   Resp 20   Ht 5\' 3"  (1.6 m)   Wt 70.5 kg   SpO2 96%   BMI 27.53 kg/m   Physical Exam: Gen: Comfortably resting in bed, nurse at bedside CVS: Pulse regular rhythm, normal rate, S1 and S2 normal. Resp: Diminished breath sounds over bases-poor inspiratory effort.  No distinct rales or rhonchi.  Right IJ TDC Abd: Soft, obese, nontender Ext: 1+ pitting edema bilaterally over lower extremities.  Left upper arm aVF with palpable  thrill.  Labs: BMET Recent Labs  Lab 07/09/20 0432 07/10/20 0417 07/11/20 0304 07/12/20 0809 07/14/20 0914  NA 138 139 137 138 138  K 4.1 4.1 4.0 4.2 3.9  CL 104 106 103 103 105  CO2 23 22 25 25 24   GLUCOSE 103* 86 119* 124* 116*  BUN 40* 38* 24* 30* 27*  CREATININE 4.88* 5.00* 3.81* 4.69* 4.37*  CALCIUM 8.4* 8.6* 8.2* 8.4* 8.2*  PHOS 4.2 4.5  --  4.1 4.1   CBC Recent Labs  Lab 07/12/20 0809 07/13/20 0159 07/14/20 0313 07/15/20 0422  WBC 7.0 6.2 7.4 7.9  HGB 8.5* 8.3* 8.8* 9.0*  HCT 27.0* 25.2* 27.4* 28.6*  MCV 101.9* 98.8 100.7* 100.4*  PLT 122* 100* 114* 127*     Medications:    . amiodarone  200 mg Oral Daily  . aspirin EC  81 mg Oral Daily  . atorvastatin  40 mg Oral q1800  . carvedilol  6.25 mg Oral BID WC  . Chlorhexidine Gluconate Cloth  6 each Topical Q0600  . [START ON 07/19/2020] darbepoetin (ARANESP) injection - DIALYSIS  100 mcg Intravenous Q Tue-HD  . folic acid  5,697 mcg Oral Daily  . sodium chloride flush  3 mL Intravenous Q12H   Elmarie Shiley, MD 07/15/2020, 8:28 AM

## 2020-07-15 NOTE — TOC Initial Note (Signed)
Transition of Care [facility]) - Initial/Assessment Note    Patient Details  Name: Kathryn Hancock MRN: [id number] Date of Birth: 08-12-1950  Transition of Care [facility]) CM/SW Contact:    Kathryn Crews, RN Phone Number: [phone] 07/15/2020, 9:00 AM  Clinical Narrative:                  Spoke with patient at the bedside to discuss transition planning. PTA home stating that her daughter lives with her. Verbal consent received to talk to her daughter. Discussed recommendations for [facility] PT and OT. Agreeable. Discussed choice of agency and provided print of from Medicare.gov of agencies that cover where she lives. Patient requested to discuss with agency choice with her daughter. NCM contact information provided. Demographics and PCP verified. Discussed DME - RW and 3N1. Agreeable. Referral sent to [employer] for delivery to the room. Daughter to provide transportation home. TOC following for transition needs.   Expected Discharge Plan: Kathryn Hancock Barriers to Discharge: No Barriers Identified   Patient Goals and CMS Choice Patient states their goals for this hospitalization and ongoing recovery are:: return home CMS Medicare.gov Compare Post Acute Care list provided to:: Patient Choice offered to / list presented to : Patient  Expected Discharge Plan and Services Expected Discharge Plan: [place]ryn [place] In-house Referral: NA Discharge Planning Services: CM Consult Post Acute Care Choice: [place] arrangements for the past 2 months: Single Family Home Expected Discharge Date: 07/15/20               DME Arranged: 3-N-1,Walker DME Agency: [facility] Date DME Agency Contacted: 07/15/20 Time DME Agency Contacted: [phone] Representative spoke with at DME Agency: Kathryn Hancock: PT,OT          Prior Living Arrangements/Services Living arrangements for the past 2 months: [place] with:: Kathryn Hancock Patient  language and need for interpreter reviewed:: Yes Do you feel safe going back to the place where you live?: Yes      Need for Family Participation in Patient Care: Yes (Comment) Care giver support system in place?: Yes (comment) Current home services: DME Criminal Activity/Legal Involvement Pertinent to Current Situation/Hospitalization: No - Comment as needed  Activities of Daily Living Home Assistive Devices/Equipment: Walker (specify type) ADL Screening (condition at time of admission) Patient's cognitive ability adequate to safely complete daily activities?: Yes Is the patient deaf or have difficulty hearing?: No Does the patient have difficulty seeing, even when wearing glasses/contacts?: No Does the patient have difficulty concentrating, remembering, or making decisions?: No Patient able to express need for assistance with ADLs?: Yes Does the patient have difficulty dressing or bathing?: Yes Independently performs ADLs?: No Communication: Independent Dressing (OT): Needs assistance Is this a change from baseline?: Pre-admission baseline Grooming: Independent Feeding: Independent Bathing: Needs assistance Is this a change from baseline?: Pre-admission baseline Toileting: Needs assistance Is this a change from baseline?: Pre-admission baseline In/Out Bed: Appropriate for developmental age 40 in Home: Needs assistance Is this a change from baseline?: Pre-admission baseline Does the patient have difficulty walking or climbing stairs?: Yes Weakness of Legs: Both Weakness of Arms/Hands: None  Permission Sought/Granted Permission sought to share information with : Family Supports Permission granted to share information with : Yes, Verbal Permission Granted  Share Information with NAME: Kathryn Hancock     Permission granted to share info w Relationship: daughter  Permission granted to share info w Contact Information: [phone]  Emotional Assessment Appearance:: Appears  stated age Attitude/Demeanor/Rapport: Engaged Affect (typically observed): Accepting,Pleasant Orientation: : Oriented to Self,Oriented to  Time,Oriented to Place,Oriented to Situation Alcohol / Substance Use: Not Applicable Psych Involvement: No (comment)  Admission diagnosis:  Hyperkalemia [E87.5] Vomiting [R11.10] ARF (acute renal failure) (HCC) [N17.9] Acute renal failure (ARF) (HCC) [N17.9] AKI (acute kidney injury) ([place]) [N17.9] Patient Active Problem List   Diagnosis Date Noted  . ARF (acute renal failure) ([place]) 07/01/2020  . PAF (paroxysmal atrial fibrillation) ([place]) 07/01/2020  . Anemia 07/01/2020  . Essential hypertension 07/01/2020  . DM (diabetes mellitus), type 2 with renal complications ([place]) 89/84/2103  . 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 Tobacco abuse 12/10/2019  . Coronary artery calcification seen on CAT scan 12/10/2019  . AAA (abdominal aortic aneurysm) without rupture ([place]) 12/10/2019   PCP:  Gala Lewandowsky, MD Pharmacy:   [facility] [place], [place] - [address] [place] [address] Phone: [phone] Fax: [phone]  [facility] [place], [place] - [address] [place] [address] Phone: [phone] Fax: [phone]  [facility] [place] - [address] [place] [address] Phone: [phone] Fax: [phone]     Social Determinants of Health (SDOH) Interventions    Readmission Risk Interventions No flowsheet data found.

## 2020-07-16 DIAGNOSIS — I7581 Atheroembolism of kidney: Secondary | ICD-10-CM | POA: Insufficient documentation

## 2020-07-16 DIAGNOSIS — D689 Coagulation defect, unspecified: Secondary | ICD-10-CM | POA: Insufficient documentation

## 2020-07-19 DIAGNOSIS — Z23 Encounter for immunization: Secondary | ICD-10-CM | POA: Insufficient documentation

## 2020-07-21 DIAGNOSIS — E43 Unspecified severe protein-calorie malnutrition: Secondary | ICD-10-CM | POA: Insufficient documentation

## 2020-07-26 DIAGNOSIS — R8290 Unspecified abnormal findings in urine: Secondary | ICD-10-CM | POA: Insufficient documentation

## 2020-07-27 DIAGNOSIS — E876 Hypokalemia: Secondary | ICD-10-CM | POA: Insufficient documentation

## 2020-08-01 ENCOUNTER — Telehealth: Payer: Self-pay | Admitting: Cardiology

## 2020-08-01 NOTE — Telephone Encounter (Signed)
Lipids & LFTs would be nice.   Thnx   Glenetta Hew, MD

## 2020-08-01 NOTE — Telephone Encounter (Signed)
Patient's daughter states the patient is now on dialysis and states if there is any other lab work Dr. Ellyn Hack would like the patient to have to let her dialysis center know. She states they will need to know no later than Wednesday night. She states the office's naem is [facility] in [place].

## 2020-08-01 NOTE — Telephone Encounter (Signed)
Spoke with the patient's daughter. The patient will be having labs drawn at her dialysis appointment on Thursday. She would like to know if Dr, Ellyn Hack would want ay labs drawn. She will be fasting.

## 2020-08-02 NOTE — Telephone Encounter (Signed)
Call placed to [facility] in [place]. They will add the labs and fax them to the office. The patient knows to fast that morning.

## 2020-08-04 ENCOUNTER — Ambulatory Visit: Payer: Medicare Other | Admitting: Cardiology

## 2020-08-05 ENCOUNTER — Other Ambulatory Visit: Payer: Self-pay

## 2020-08-05 ENCOUNTER — Ambulatory Visit (INDEPENDENT_AMBULATORY_CARE_PROVIDER_SITE_OTHER): Payer: Medicare Other | Admitting: Cardiology

## 2020-08-05 VITALS — BP 160/70 | HR 86 | Ht 63.0 in | Wt 140.0 lb

## 2020-08-05 DIAGNOSIS — Z720 Tobacco use: Secondary | ICD-10-CM | POA: Diagnosis not present

## 2020-08-05 DIAGNOSIS — I25110 Atherosclerotic heart disease of native coronary artery with unstable angina pectoris: Secondary | ICD-10-CM

## 2020-08-05 DIAGNOSIS — I96 Gangrene, not elsewhere classified: Secondary | ICD-10-CM

## 2020-08-05 DIAGNOSIS — I10 Essential (primary) hypertension: Secondary | ICD-10-CM | POA: Diagnosis not present

## 2020-08-05 DIAGNOSIS — Z992 Dependence on renal dialysis: Secondary | ICD-10-CM

## 2020-08-05 DIAGNOSIS — E785 Hyperlipidemia, unspecified: Secondary | ICD-10-CM | POA: Insufficient documentation

## 2020-08-05 DIAGNOSIS — Z951 Presence of aortocoronary bypass graft: Secondary | ICD-10-CM | POA: Diagnosis not present

## 2020-08-05 DIAGNOSIS — I779 Disorder of arteries and arterioles, unspecified: Secondary | ICD-10-CM

## 2020-08-05 DIAGNOSIS — E1129 Type 2 diabetes mellitus with other diabetic kidney complication: Secondary | ICD-10-CM

## 2020-08-05 DIAGNOSIS — I480 Paroxysmal atrial fibrillation: Secondary | ICD-10-CM

## 2020-08-05 DIAGNOSIS — D151 Benign neoplasm of heart: Secondary | ICD-10-CM

## 2020-08-05 DIAGNOSIS — I714 Abdominal aortic aneurysm, without rupture, unspecified: Secondary | ICD-10-CM

## 2020-08-05 DIAGNOSIS — E1169 Type 2 diabetes mellitus with other specified complication: Secondary | ICD-10-CM

## 2020-08-05 DIAGNOSIS — N186 End stage renal disease: Secondary | ICD-10-CM

## 2020-08-05 DIAGNOSIS — D638 Anemia in other chronic diseases classified elsewhere: Secondary | ICD-10-CM

## 2020-08-05 HISTORY — DX: Hyperlipidemia, unspecified: E78.5

## 2020-08-05 HISTORY — DX: Type 2 diabetes mellitus with other specified complication: E11.69

## 2020-08-05 NOTE — Patient Instructions (Addendum)
Medication Instructions:  No changes   *If you need a refill on your cardiac medications before your next appointment, please call your pharmacy*   Lab Work: Lipid  Liver   ( labs were done yesterday  08/04/20 at dialysis as ordered)  If you have labs (blood work) drawn today and your tests are completely normal, you will receive your results only by: Marland Kitchen MyChart Message (if you have MyChart) OR . A paper copy in the mail If you have any lab test that is abnormal or we need to change your treatment, we will call you to review the results.   Testing/Procedures:  [address] Your physician has requested that you have an echocardiogram. Echocardiography is a painless test that uses sound waves to create images of your heart. It provides your doctor with information about the size and shape of your heart and how well your heart's chambers and valves are working. This procedure takes approximately one hour. There are no restrictions for this procedure.    Follow-Up: At [facility], you and your health needs are our priority.  As part of our continuing mission to provide you with exceptional heart care, we have created designated Provider Care Teams.  These Care Teams include your primary Cardiologist (physician) and Advanced Practice Providers (APPs -  Physician Assistants and Nurse Practitioners) who all work together to provide you with the care you need, when you need it.     Your next appointment:   3 month(s)  The format for your next appointment:   In Person  Provider:   Glenetta Hew, MD

## 2020-08-05 NOTE — Progress Notes (Signed)
Primary Care Provider: Gala Lewandowsky, MD Cardiologist: Glenetta Hew, MD  Electrophysiologist: None  Vascular Surgeon: Dr. Gae Gallop (Local CTSgx - Dr. Roxy Manns; [facility] CT Sgx Dr. Wende Bushy))  Clinic Note: No chief complaint on file.  ===================================  ASSESSMENT/PLAN   Problem List Items Addressed This Visit    Left atrial myxoma - Primary (Chronic)    Unfortunately, it took quite a long time for additional diagnosis to get this treated.  The unfortunate fact is that she is stent suffered embolic events leading to catastrophic renal failure and worsening PAD with likely distal thromboembolization. Now status post resection-unfortunately had to be done at [facility] because of no beds at [facility].  Plan: Recheck 2D echocardiogram to get new baseline echo-assess valves and wall motion. Remains on Eliquis for anticoagulation.       Relevant Orders   EKG 12-Lead (Completed)   ECHOCARDIOGRAM COMPLETE   Coronary artery disease involving native coronary artery of native heart with unstable angina pectoris (HCC) (Chronic)    No anginal symptoms noted.  She clearly had coronary calcification noted on CT.  Preop cath showed RCA disease.  Now status post CABG x1.  Never really had any angina.  He does have significant peripheral vascular disease as well as well as AAA.  Plan:  On atorvastatin-need to assess lipids.  On carvedilol 6.25 mg twice daily-increasing to 12.5 mg twice daily,  On amlodipine (no longer on ARB because of ESRD) for both blood pressure and antianginal/claudication benefit.  Not on Plavix, but on aspirin and Eliquis.      ESRD needing dialysis ([place]) (Chronic)    Now feeling chilled by dialysis.  No signs of overload.  Seems to doing relatively well.      Relevant Orders   EKG 12-Lead (Completed)   ECHOCARDIOGRAM COMPLETE   Anemia of chronic disease (Chronic)    Per Nephrology      S/P CABG x 1 (Chronic)    1 vessel CABG bypass  in the RCA.  Follow-up for ischemia.  Will consider routine stress testing in roughly 4 years.      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 Gae Gallop.  Plan is to allow her to recover from her ongoing comorbidities.  Unless symptoms progress, no plans for urgent revascularization which would likely require bilateral femoral- pop bypass.  Continue aggressive cardiovascular stratification with statin, aspirin, beta-blocker along with amlodipine.      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  Likely needs ongoing wound care.  Home health evaluating as well.       Tobacco abuse (Chronic)    She has cut down to half pack a day.  He is working on trying to fully quit.  Just finding it very hard because he is somewhat emotional with all the ongoing events.  It seems to be a crutch for now.   Smoking cessation instruction/counseling given:  counseled patient on the dangers of tobacco use, advised patient to stop smoking, and reviewed strategies to maximize success; 5 min spent discussing cessation counseling.       Relevant Orders   EKG 12-Lead (Completed)   ECHOCARDIOGRAM COMPLETE   AAA (abdominal aortic aneurysm) without rupture (HCC) (Chronic)    Also being followed by Dr. Scot Dock.  Question if she would potentially develop tolerate EVAR  Defer to Dr. Scot Dock.  Relevant Orders   EKG 12-Lead (Completed)   ECHOCARDIOGRAM COMPLETE   PAF (paroxysmal atrial fibrillation) (HCC) (Chronic)    As far as I can tell, no recurrence.  However, she probably would not be able to tolerate recurrence of A. fib.  She clearly has the substrate for it.  We will continue with amiodarone for now until she is more stable from a general standpoint.  I will have her reduce it to 100 mg daily.  She continues to be on Eliquis which is beneficial both from a PAD, PAF as well as  prior left atrial myxoma standpoint.      Relevant Orders   EKG 12-Lead (Completed)   ECHOCARDIOGRAM COMPLETE   Essential hypertension (Chronic)    Quite hypertensive today which is unusual.  She is  Plan: Increase carvedilol to 12.5 mg twice daily.  Most likely still having to titrate further.  Next step would be to increase amlodipine to 10 mg.      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  She is on statin. We will plan to order lipid panel and LFTs.  Adjust statin dosing as necessary.  Target LDL now less than 70 with PAD and CAD.  No benefit for SGLT2 I with ESRD.  Question benefit from GLP-1 antagonist. Currently on sulfonylurea alone.  Last A1c was 5.6.      Relevant Medications   glimepiride (AMARYL) 1 MG tablet   Other Relevant Orders   EKG 12-Lead (Completed)   ECHOCARDIOGRAM COMPLETE      ===================================  HPI:    Kathryn Hancock is a 70 y.o. female with a very complicated PMH reviewed below who presents today for Hospital F/u.  Notable PMH  AAA -> surreptitiously found on a CT of the abdomen to evaluate for kidney stones  Referred to Dr. Doren Custard October 2020 -> CT angiogram ordered confirming several small pseudoaneurysms of the abdominal aorta along with a filling defect in the left atrium suspicious for myxoma.  Also noted was coronary calcification  LEFT ATRIAL MYXOMA - s/p resection  Initially seen on CT angiogram of the abdomen pelvis => echo done July 24, 2019  (suggestion was organized thrombus versus tumor)  Initial cardiology consultation July 1 => cardiac MRI ordered  confirming LEFT ATRIAL MYXOMA -> referred to CVTS (Dr. Roxy Manns) - visit delayed until Dec 6.  Was started on Eliquis for prophylaxis.  Planned TEE/R&LHC in Early Dec (although she was actually thinking about considering waiting until after Christmas)  CAD- (noted on Pre-Op Cath) - CABG x 1  PAD - R toe Ulcer; Bilateral R>L LE claudication  ESRD (CKD3 was exacerbated by ARF/Hyperkalemia - likely embolic event) - now on HD  PAF (post-Op) - on Eliquis  HTN/HLD  DM-2  Long-term smoker  Kathryn Hancock was initially seen in consultation on July 1 for evaluation of left atrial mass seen on CT scan and echocardiogram.  There was initial suggestion of this being thrombus, but it clearly was a mass.  Recommendation was for Cardiac MRI. => She did note some palpitations and one long episode of chest pain in the past  2 years.  Baseline exertional dyspnea-related to smoking. She was last seen on February 12, 2020 for follow-up of her Cardiac MRI confirming a large left atrial mass consistent with LEFT ATRIAL MYXOMA attached to the intra-atrial septum.  She noted some palpitations and baseline exertional dyspnea. => Was referred to Dr. Roxy Manns (CVTS).  She was not yet ready to make a decision about surgery.  My plan was to proceed with ischemic evaluation once she had been seen by Dr. Roxy Manns. ->   Unfortunately, she was not seen until December 6 -> recommended TEE and cath while she contemplated surgery. => Procedure was scheduled for December 17  Precath labs checked on 1216 revealed creatinine increased at 3.3.  (There was concern for embolic phenomenon causing renal failure) =>  procedure canceled, told to go to the closest ER.;  ARB held  Recent Hospitalizations:   [facility] 12/17-19/2021: Admitted for acute renal failure with hyperkalemia.  (Cr 3.9, K 7.3), Peaked T waves, Metabolic Acidosis (VBG 2.13) => treated with calcium chloride, insulin, bicarbonate and Lokelma (K down to 5.6 & Cr to 3.2 -  still making urine. -Interestingly, she denied any decreased urine output.  No nausea or vomiting.  Also noted increased redness and toes.  More swelling in the right toe starting 05/27/20 - R 2nd toe wound noted (painful) - thought that she may have stepped on something sharp. => Unfortunately, there were no beds available at [facility] -transfer to> [facility]) 12/19-21/2021; apixaban held, heparin drip started. => Renal failure as well as toe discoloration thought to be related to thromboembolism from left atrial mass.  Cardiology consult: TEE showed large left atrial mass-concern for thromboembolism, need for urgent surgery => required transfer to [facility] for Urgent CT Surgical Consult (Dr. Wende Bushy)  R toe Ulcer - (palpable pulses, intact sensation & motor - NO critical Limb Ischemia) => ABIs   Anemic - Hgb dropped to7.3 prior to transfer  ARF -> No SSx of hypervolemia with pulmonar edema.  #2 acidosis resolved.  Not uremic.  No urgent dialysis required.  Renal Doppler negative for 8 obstructive uropathy) nephrology consult suggested possible hypertension and diabetes related CKD.  Clearly this was a thromboembolic event.  [place] 12/21-30/2021  12/22: BiPlane Cor Angio & RHC.  Significant proximal RCA PDA disease.  12/23: Excision / Resection of LA Myxoma & CABG x 1 (SVG-rPDA); Mini Laparotomy w/ Tubal Ligation  Post-op Afib -> cardioverted with amiodarone and beta-blocker titration. - NSR on discharge.  => transition to [facility] on discharge  AKI on CKD - Nephrology suggested Thromboembolic Event from LA Myxoma vs. PAD related atheroemboli. => Exacerbated by intraoperative hypotension; volume required IV Lasix. (d/c Cr 4.1) - planned f/u with [facility] Nephrology  R toes - now with Necrotic lesions - Vasc Sgx C/s -  treated with clindamycin for associated cellulitis.   Discharge MEDICATIONS: atorvastatin 20 mg, apixaban 5 mg twice daily,  Jardiance 25 mg twice daily, amiodarone 200 mg daily, amlodipine 5 mg daily, aspirin 81 mg daily, Coreg 12.5 mg twice daily, Lasix 20 mg twice daily.  (Also Roxicodone, MiraLAX, ferrous sulfate, vitamin C, vitamin D) B12 as well as folate.;  Losartan, Metformin and exenatide discontinued.   1/20-07/15/2020 [place]: Follow-up labs from nephrology showed worsening renal function had noted-> 2 weeks of worsening nausea, vomiting, unable to keep any thing down.  No diarrhea or abdominal pain.  (Dehydrated -uremic).  Advised to come to the ER. ->  AoCKDIIIb--progression to ESRD => intermittent nausea and hyperkalemia resolved with dialysis.  Continued on amiodarone and Coreg along with Eliquis for A. Fib.  Dry gangrene of the bilateral feet but worse on the right. ->  Vascular surgery consultation => abdominal aortogram with bilateral LE runoff (06/30/2020) bilateral occluded SFAs.  Conservative management for now due to other comorbidities.  First days of brachiocephalic AV fistula 2/77/8242  Discharge home with rolling walker, home health PT.  Started outpatient HD T/TH/SAT  07/27/2020 - F/u with Dr. Ivin Poot, PA (noted readmission for AoCKD - ESRD & HD start). -No chest pain, palpitation, dizziness or dyspnea.  Just fatigue but getting better.  Exercise tolerance is improving.  Working with physical therapy, but limited by her foot pain.  Recommended daily weights.=>    Reviewed  CV studies:    The following studies were reviewed today: (if available, images/films reviewed: From Epic Chart or Care Everywhere)  [place]:  TEE 05/31/2020: Normal LV function.  No LAA thrombus.  Large mobile left atrial mass attached to the left atrial septum (4.8 x 3.1 cm with calcification)-most consistent with atrial myxoma.  EF estimated 55%.  Normal valves.  ABIs/ LEA Dopplers 05/31/2020 -> suggested moderate to severe PAD on the right leg and moderate PAD on the left.  Recommended vascular  consultation.  Right leg: 0.65 Left leg: 0.69 Right TBI: 0.34 Left TBI: 0.47   [facility]  Cardiac Cath 06/01/2020: Biplane Coronary Angiography - 25 mL =.  Significant proximal RCA and PDA disease; Elevated PCWP. Normal CO/CI.   CT Sgx 212/23/2021:   Intra-Op TEE Pre-Bypass: Normal Biventricular function (LVEF > 55%). Large LA mass with calcifications and minimal vascularity throughout, measured at 5.92 x 4cm at largest. LA dilation. Trace MR, no mitral stenosis. No increase in pulmonary vein velocities. Diffuse  atherosclerosis in descending aorta, focal calcification on sinus of valsalva. Mild eccentric AI from LCC/NCC.   Post-Bypass: Upon initial weaning of CPB, laceration noted in RV requiring return to CPB and fixation. S/p weaning with normal biventricular function on no inotropic support. S/p mass excision with no residual mass noted in LA, no ASD or PFO noted.  Pulmonaryveins and mitral valve similar to pre-procedure.   [place]:  07/08/2020 (Dr. Scot Dock): AbAoGram & LEA Runoff: 2 R Renal A, 1 L Renal A. Known Para-renal Aneurysm.   Right: Common Iliac, external iliac and hypogastric arteries patent; CFA/Deep FA patent.  RSFA-occluded at origin, reconstitutes at AK Popliteal A - 2 V runnoff via  R Peroneal A and R Posterior Tibial A, R ATA CTO  Left: Common Iliac, external iliac and hypogastric arteries patent; CFA/Deep FA patent. L SFA - occluded at origin, reconstitutes at AK Popliteal A - 2 V runnoff via R Peroneal A and R Posterior Tibial A, L  ATA CTO CLINICAL NOTE: This patient has Bilat SFA TO. Currently her toes are stable. If she developed worsening wounds on her feet her best chance for limb salvage would be staged bilateral femoral to above-knee popliteal artery bypass graft. Currently given that she is just started dialysis with multiple medical comorbidities we will not plan revascularization unless the wounds progress.  Interval History:   Kathryn Hancock presents here  today after extensive hospitalizations, quite exasperated.  She actually is doing fairly well from a cardiac standpoint.  She is not having any palpitations or rapid irregular heartbeats.  No volume overload as she is on dialysis now.  She is not having any anginal symptoms with rest exertion, she  simply is not doing very much.  She is working with home health PT, but her exercise is limited by the fact that her feet especially her right foot hurts dramatically.  She has dry gangrene with black eschar on several of the right foot toes with reddening on both feet.  She also has bilateral claudication.  She is currently undergoing dialysis through a dialysis catheter, but the fistula is in process of maturing.  She seems to be coming to grips with the fact that she is now progressed from CKD 3 DES already in the matter of a few months.  Other musculoskeletal chest pain, she is actually feeling fine Point really her limitations are claudication and foot pain.  She has her baseline exertional dyspnea that is stable.  She has cut down to about half pack a day from 2 packs a day cigarettes.  CV Review of Symptoms (Summary): positive for - dyspnea on exertion, shortness of breath and Occasional palpitations.  Not really edema, but red swelling of her feet.;  Bilateral calf claudication.  Both feet hurt, right worse than left.  Unable to walk on them. negative for - chest pain, edema, orthopnea, paroxysmal nocturnal dyspnea, rapid heart rate or Lightheadedness, dizziness or syncope/near syncope.  Headaches or blurred vision.  TIA/amaurosis fugax.  The patient does not have symptoms concerning for COVID-19 infection (fever, chills, cough, or new shortness of breath).   REVIEWED OF SYSTEMS   Review of Systems  Constitutional: Positive for malaise/fatigue and weight loss (She lost quite a bit of weight in the hospital, has put some weight back on though.  Eating better.).  HENT: Negative for congestion and  nosebleeds.   Respiratory: Positive for cough (Morning cough) and shortness of breath.   Cardiovascular: Positive for claudication and leg swelling (More feet).  Gastrointestinal: Positive for abdominal pain. Negative for blood in stool, melena, nausea and vomiting.  Genitourinary: Negative for hematuria.  Musculoskeletal: Negative for falls and myalgias.       Right greater than left foot pain.  Dry gangrene.  Neurological: Positive for dizziness (More unsteady gait.) and weakness (Global). Negative for tremors, sensory change and speech change.  Psychiatric/Behavioral: Negative for depression (Actually seems to be handling this quite well.) and memory loss (Does not remember a lot of either hospitalization.). The patient is nervous/anxious. The patient does not have insomnia.    I have reviewed and (if needed) personally updated the patient's problem list, medications, allergies, past medical and surgical history, social and family history.   PAST MEDICAL HISTORY   Past Medical History:  Diagnosis Date  . AAA (abdominal aortic aneurysm) ([place]) 06/2019   Multiple small pseudoaneurysmal projections of the dominant aorta.  Distal abdominal aortic aneurysm 4.5 x 4.7 cm.  Greatest AP dimension of the infrarenal aorta is 4.9.  No evidence of thoracic aortic aneurysm or dissection.  Brief segment of proximal IMA occlusion.  . Coronary artery calcification seen on CAT scan 06/2019  . Coronary artery disease involving native coronary artery of native heart with unstable angina pectoris ([place]) 06/01/2020   Cardiac cath at [facility] 06/01/2020-preop for atrial myxoma resection -> severe proximal RCA and PDA -> had single-vessel CABG with SVG-PDA along with myxoma resection.  . DM (diabetes mellitus), type 2 with renal complications ([place]) 2/37/6283  . Dry gangrene ([place]) -> right foot-toes 05/27/2020  . ESRD (end stage renal disease) on dialysis ([place]) 06/2020   Progression of CKDIIIb to ESRD initially related  to thromboembolic event from left atrial  myxoma; complicated by perioperative hypotension-; now 1 on HD TU/TH/SAT  . Heavy smoker (more than 20 cigarettes per day)    ~ 2 ppd; since age 70 (100 pk yr) = has cut down to one half PPD.>  . Hyperlipidemia    Mixed  . Hyperlipidemia associated with type 2 diabetes mellitus ([place]) 08/05/2020  . Hypertension   . 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 = Status post resection December 23,2021 (done at [facility] because of no bed availability at [facility]  . Microscopic hematuria   . PAF (paroxysmal atrial fibrillation) ([place]) 07/01/2020   Initially noted postoperatively-left atrial myxoma resection and CABG x1.  Now on amiodarone and apixaban..  . Plantar wart of right foot     PAST SURGICAL HISTORY   Past Surgical History:  Procedure Laterality Date  . ABDOMINAL AORTOGRAM W/LOWER EXTREMITY N/A 07/08/2020   Procedure: ABDOMINAL AORTOGRAM W/LOWER EXTREMITY;  Surgeon: Angelia Mould, MD;  Location: [facility];  Service: Cardiovascular;  Laterality: N/A;  . AV FISTULA PLACEMENT Left 07/11/2020   Procedure: LEFT FIRST STAGE BRACHIOBASILIC UPPER EXTREMITY ARTERIOVENOUS (AV) FISTULA CREATION;  Surgeon: [name], MD;  Location: [facility];  Service: Vascular;  Laterality: Left;  . Cardiac MRI  01/22/2020   Normal LV size and function.  Moderate focal basal septal hypertrophy.  No S.A.M.  LVEF 61%.  RVEF 66%.  Large mobile mass in the left atrium attached to the interatrial septum-does not appear to thrombus characteristics consistent with left atrial myxoma.  . Chest CTA  06/2019   Multiple small pseudoaneurysmal projections of the dominant aorta.  Distal abdominal aortic aneurysm 4.5 x 4.7 cm.  Greatest AP dimension of the infrarenal aorta is 4.9.  No evidence of thoracic aortic aneurysm or dissection.  Brief segment of proximal IMA occlusion.  Large  geographic filling defect of the left atrium with associated dense radiopaque material.  Aortic atherosclerosis and emphysema  . COLONOSCOPY W/ POLYPECTOMY    . EYE SURGERY Bilateral    Cataract surgery  . IR FLUORO GUIDE CV LINE RIGHT  07/06/2020  . IR US GUIDE VASC ACCESS RIGHT  07/06/2020  . TRANSTHORACIC ECHOCARDIOGRAM  07/2019    EF 60 to 65%.  GR 1 DD.  Elevated LVEDP.  Large size ill-defined echodensity in the left atrium suspicious for thrombus versus tumor.  Moderately dilated LA.  . TUBAL LIGATION       There is no immunization history on file for this patient.  MEDICATIONS/ALLERGIES   No outpatient medications have been marked as taking for the 08/05/20 encounter (Office Visit) with Leonie Man, MD.    Allergies  Allergen Reactions  . Penicillins Other (See Comments)    Childhood reaction.    SOCIAL HISTORY/FAMILY HISTORY   Reviewed in Epic:  Pertinent findings:  Social History   Tobacco Use  . Smoking status: Current Every Day Smoker    Packs/day: 2.00    Years: 50.00    Pack years: 100.00    Types: Cigarettes  . Smokeless tobacco: Never Used  Vaping Use  . Vaping Use: Never used  Substance Use Topics  . Alcohol use: Not Currently  . Drug use: Never   Social History   Social History Narrative   She is a mother of 2-but her son died in 10/17/19 of a drug overdose.   Denies alcohol use.      Patient is widowed and  lives with her daughter and several grandchildren locally in [place].     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     At baseline, she lives a sedentary lifestyle and does not exercise or get out of the house much at all.  She is limited by bilateral calf claudication.     She also experiences some exertional shortness of breath which is chronic and stable.       She smokes between 1 and 2 packs of cigarettes daily and has been smoking since she was age 66.     Both her parents require  dialysis.  OBJCTIVE -PE, EKG, labs   Wt Readings from Last 3 Encounters:  08/05/20 140 lb (63.5 kg)  07/15/20 155 lb 6.8 oz (70.5 kg)  05/16/20 145 lb 9.6 oz (66 kg)    Physical Exam: BP (!) 160/70 (BP Location: Right Arm, Patient Position: Sitting, Cuff Size: Normal)   Pulse 86   Ht 5\' 3"  (1.6 m)   Wt 140 lb (63.5 kg)   BMI 24.80 kg/m  Physical Exam Vitals reviewed.  Constitutional:      General: She is not in acute distress.    Appearance: Normal appearance. She is normal weight. She is ill-appearing (Chronically ill.). She is not toxic-appearing or diaphoretic.  HENT:     Head: Normocephalic and atraumatic.  Neck:     Vascular: No carotid bruit, hepatojugular reflux or JVD.  Cardiovascular:     Rate and Rhythm: Normal rate and regular rhythm.  No extrasystoles are present.    Chest Wall: PMI is not displaced.     Pulses: Decreased pulses.          Femoral pulses are on the right side with bruit and on the left side with bruit.      Popliteal pulses are 1+ on the right side and 1+ on the left side.       Dorsalis pedis pulses are 0 on the right side and 0 on the left side.       Posterior tibial pulses are 1+ on the right side and 1+ on the left side.     Heart sounds: Normal heart sounds. No murmur heard. No friction rub. No gallop.   Pulmonary:     Effort: Pulmonary effort is normal. No respiratory distress.     Breath sounds: Wheezing present. No rhonchi or rales.     Comments: Diffuse bibasilar crackles Chest:     Chest wall: Tenderness (Normal postop chest wall tenderness) present.  Musculoskeletal:     Cervical back: Normal range of motion and neck supple.  Skin:    General: Skin is warm and dry.     Coloration: Skin is pale. Skin is not jaundiced.     Comments: Right foot diffusely red from the midfoot to the tips.  The plantar MTP joints have diffuse black eschar on the middle 3 toes.  Very tender.  Not warm.  Neurological:     General: No focal deficit  present.     Mental Status: She is alert and oriented to person, place, and time.     Motor: Weakness (Generalized) present.     Gait: Gait abnormal (Definitely favors the right foot.).  Psychiatric:        Mood and Affect: Mood normal.        Behavior: Behavior normal.        Thought Content: Thought content normal.  Judgment: Judgment normal.     Adult ECG Report  Rate: 86;  Rhythm: normal sinus rhythm and Normal axis, intervals and durations.;   Narrative Interpretation: Stable/normal  Recent Labs:    03/05/2017: TC 109, TG 274, HDL 33, LDL 21.  Last recorded lipids A1c (07/01/2020: 5.6. No results found for: CHOL, HDL, LDLCALC, LDLDIRECT, TRIG, CHOLHDL Lab Results  Component Value Date   CREATININE 4.37 (H) 07/14/2020   BUN 27 (H) 07/14/2020   NA 138 07/14/2020   K 3.9 07/14/2020   CL 105 07/14/2020   CO2 24 07/14/2020   CBC Latest Ref Rng & Units 07/15/2020 07/14/2020 07/13/2020  WBC 4.0 - 10.5 K/uL 7.9 7.4 6.2  Hemoglobin 12.0 - 15.0 g/dL 9.0(L) 8.8(L) 8.3(L)  Hematocrit 36.0 - 46.0 % 28.6(L) 27.4(L) 25.2(L)  Platelets 150 - 400 K/uL 127(L) 114(L) 100(L)    No results found for: TSH  ==================================================  COVID-19 Education: The signs and symptoms of COVID-19 were discussed with the patient and how to seek care for testing (follow up with PCP or arrange E-visit).   The importance of social distancing and COVID-19 vaccination was discussed today. The patient is practicing social distancing & Masking.   I spent a total of 35minutes with the patient spent in direct patient consultation.  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  Has also progressed to ESRD with significant worsening PAD.  Total Time: 84  min   Current medicines are reviewed at length with the patient today.  (+/- concerns) none  This visit occurred during the SARS-CoV-2 public health emergency.  Safety protocols were in place, including screening questions prior to the visit, additional usage of staff PPE, and extensive cleaning of exam room while observing appropriate contact time as indicated for disinfecting solutions.  Notice: This dictation was prepared with Dragon dictation along with smaller phrase technology. Any transcriptional errors that result from this process are unintentional and may not be corrected upon review.  Patient Instructions / Medication Changes & Studies & Tests Ordered   Patient Instructions  Medication Instructions:  No changes   *If you need a refill on your cardiac medications before your next appointment, please call your pharmacy*   Lab Work: Lipid  Liver   ( labs were done yesterday  08/04/20 at dialysis as ordered)  If you have labs (blood work) drawn today and your tests are completely normal, you will receive your results only by: Marland Kitchen MyChart Message (if you have MyChart) OR . A paper copy in the mail If you have any lab test that is abnormal or we need to change your treatment, we will call you to review the results.   Testing/Procedures:  [address] Your physician has requested that you have an echocardiogram. Echocardiography is a painless test that uses sound waves to create images of your heart. It provides your doctor with information about the size and shape of your heart and how well your heart's chambers and valves are working. This procedure takes approximately one hour. There are no restrictions for this procedure.    Follow-Up: At [facility], you and your health needs are our priority.  As part of our continuing mission to provide you with exceptional heart care, we have created designated Provider Care Teams.  These Care Teams  include your  primary Cardiologist (physician) and Advanced Practice Providers (APPs -  Physician Assistants and Nurse Practitioners) who all work together to provide you with the care you need, when you need it.     Your next appointment:   3 month(s)  The format for your next appointment:   In Person  Provider:   Glenetta Hew, MD       Studies Ordered:   Orders Placed This Encounter  Procedures  . EKG 12-Lead  . ECHOCARDIOGRAM COMPLETE     Glenetta Hew, M.D., M.S. Interventional Cardiologist   Pager # [phone] Phone # [phone] [address]. [place], [place] [address]   Thank you for choosing Heartcare at [facility]!!

## 2020-08-08 NOTE — Telephone Encounter (Signed)
Patient stated she did have labs done at her office visit.  Awaiting for results to come to office

## 2020-08-09 ENCOUNTER — Encounter: Payer: Self-pay | Admitting: Cardiology

## 2020-08-09 NOTE — Assessment & Plan Note (Signed)
Currently being followed by Dr. Gae Gallop.  Plan is to allow her to recover from her ongoing comorbidities.  Unless symptoms progress, no plans for urgent revascularization which would likely require bilateral femoral- pop bypass.  Continue aggressive cardiovascular stratification with statin, aspirin, beta-blocker along with amlodipine.

## 2020-08-09 NOTE — Assessment & Plan Note (Signed)
No anginal symptoms noted.  She clearly had coronary calcification noted on CT.  Preop cath showed RCA disease.  Now status post CABG x1.  Never really had any angina.  He does have significant peripheral vascular disease as well as well as AAA.  Plan:  On atorvastatin-need to assess lipids.  On carvedilol 6.25 mg twice daily-increasing to 12.5 mg twice daily,  On amlodipine (no longer on ARB because of ESRD) for both blood pressure and antianginal/claudication benefit.  Not on Plavix, but on aspirin and Eliquis.

## 2020-08-09 NOTE — Assessment & Plan Note (Signed)
Also being followed by Dr. Scot Dock.  Question if she would potentially develop tolerate EVAR  Defer to Dr. Scot Dock.

## 2020-08-09 NOTE — Assessment & Plan Note (Signed)
Per Nephrology.

## 2020-08-09 NOTE — Assessment & Plan Note (Signed)
Now feeling chilled by dialysis.  No signs of overload.  Seems to doing relatively well.

## 2020-08-09 NOTE — Assessment & Plan Note (Signed)
Quite hypertensive today which is unusual.  She is  Plan: Increase carvedilol to 12.5 mg twice daily.  Most likely still having to titrate further.  Next step would be to increase amlodipine to 10 mg.

## 2020-08-09 NOTE — Assessment & Plan Note (Signed)
She has cut down to half pack a day.  He is working on trying to fully quit.  Just finding it very hard because he is somewhat emotional with all the ongoing events.  It seems to be a crutch for now.   Smoking cessation instruction/counseling given:  counseled patient on the dangers of tobacco use, advised patient to stop smoking, and reviewed strategies to maximize success; 5 min spent discussing cessation counseling.

## 2020-08-09 NOTE — Assessment & Plan Note (Signed)
Will defer to PCP and Dr.Dickson has been on antibiotics.  Likely needs ongoing wound care.  Home health evaluating as well.

## 2020-08-09 NOTE — Assessment & Plan Note (Addendum)
Unfortunately, it took quite a long time for additional diagnosis to get this treated.  The unfortunate fact is that she is stent suffered embolic events leading to catastrophic renal failure and worsening PAD with likely distal thromboembolization. Now status post resection-unfortunately had to be done at [facility] because of no beds at [facility].  Plan: Recheck 2D echocardiogram to get new baseline echo-assess valves and wall motion. Remains on Eliquis for anticoagulation.

## 2020-08-09 NOTE — Assessment & Plan Note (Signed)
As far as I can tell, no recurrence.  However, she probably would not be able to tolerate recurrence of A. fib.  She clearly has the substrate for it.  We will continue with amiodarone for now until she is more stable from a general standpoint.  I will have her reduce it to 100 mg daily.  She continues to be on Eliquis which is beneficial both from a PAD, PAF as well as prior left atrial myxoma standpoint.

## 2020-08-09 NOTE — Assessment & Plan Note (Addendum)
She has not had lipid evaluation in a long time.  She is on statin. We will plan to order lipid panel and LFTs.  Adjust statin dosing as necessary.  Target LDL now less than 70 with PAD and CAD.  No benefit for SGLT2 I with ESRD.  Question benefit from GLP-1 antagonist. Currently on sulfonylurea alone.  Last A1c was 5.6.

## 2020-08-09 NOTE — Assessment & Plan Note (Signed)
1 vessel CABG bypass in the RCA.  Follow-up for ischemia.  Will consider routine stress testing in roughly 4 years.

## 2020-08-10 ENCOUNTER — Other Ambulatory Visit: Payer: Self-pay

## 2020-08-10 ENCOUNTER — Ambulatory Visit (INDEPENDENT_AMBULATORY_CARE_PROVIDER_SITE_OTHER): Payer: Medicare Other | Admitting: Physician Assistant

## 2020-08-10 VITALS — BP 133/65 | HR 68 | Temp 97.9°F | Resp 20 | Ht 63.0 in | Wt 138.7 lb

## 2020-08-10 DIAGNOSIS — I739 Peripheral vascular disease, unspecified: Secondary | ICD-10-CM

## 2020-08-10 DIAGNOSIS — N184 Chronic kidney disease, stage 4 (severe): Secondary | ICD-10-CM

## 2020-08-10 NOTE — Progress Notes (Signed)
    Postoperative Access Visit   History of Present Illness   Kathryn Hancock is a 70 y.o. year old female who presents for postoperative follow-up for: left first stage basilic vein transposition by Dr. Carlis Abbott  (Date: 07/11/20). She denies signs or symptoms of steal syndrome in her left hand.  She is dialyzing via [place].  She had some drainage from L arm incision and was referred back to office for evaluation.  She also reports thin blood tinged drainage but this has since resolved.  Patient also underwent aortogram with bilateral lower extremity runoff by Dr. Scot Dock on 07/08/2020 which demonstrated bilateral SFA occlusions. She has known gangrenous changes to the toes of both feet and is scheduled to follow-up with Dr. Scot Dock next week to discuss possible bypass surgery. She states that her toes on both feet are hurting however this is currently tolerable. She denies any drainage however states that the toe tips are now darker in color. Dates she is down to a half a pack a day.   Physical Examination   Vitals:   08/10/20 0942  BP: 133/65  Pulse: 68  Resp: 20  Temp: 97.9 F (36.6 C)  TempSrc: Temporal  SpO2: 99%  Weight: 138 lb 11.2 oz (62.9 kg)  Height: 5\' 3"  (1.6 m)   Body mass index is 24.57 kg/m.  left arm Incision is healed, palpable radial pulse, hand grip is 5/5, sensation in digits is  intact, palpable thrill  BLE   Gangrenous toe tips L more than R well demarcated.  No sign of infection, no drainage, no surrounding erythema  Medical Decision Making   Kathryn Hancock is a 70 y.o. year old female who presents s/p left first stage basilic vein transposition   Patent L basilic vein fistula without signs or symptoms of steal syndrome  Recommended use of a warm compress to help reabsorption of hematoma/fluid collection in left arm  Duplex later this month as scheduled to see if fistula is ready for second stage   Patient with known bilateral SFA occlusions based on recent  angiogram  Well demarcated gangrenous changes of bilateral lower extremities; tissue loss of left foot is more extensive than right  Continue daily cleansing with soap and water then pat dry; toes with Betadine  Patient has an appointment for arterial imaging and to see Dr. Scot Dock next week to discuss possible bypass surgery   Dagoberto Ligas PA-C [facility] [place] Office: [phone]  Clinic MD: Scot Dock

## 2020-08-17 ENCOUNTER — Other Ambulatory Visit: Payer: Self-pay

## 2020-08-17 ENCOUNTER — Ambulatory Visit (HOSPITAL_COMMUNITY)
Admission: RE | Admit: 2020-08-17 | Discharge: 2020-08-17 | Disposition: A | Discharge status: Home / Self Care | Payer: Medicare Other | Source: Ambulatory Visit | Attending: Vascular Surgery | Admitting: Vascular Surgery

## 2020-08-17 ENCOUNTER — Ambulatory Visit (INDEPENDENT_AMBULATORY_CARE_PROVIDER_SITE_OTHER)
Admit: 2020-08-17 | Discharge: 2020-08-17 | Disposition: A | Discharge status: Home / Self Care | Payer: Medicare Other | Attending: Vascular Surgery | Admitting: Vascular Surgery

## 2020-08-17 ENCOUNTER — Encounter: Payer: Self-pay | Admitting: Vascular Surgery

## 2020-08-17 ENCOUNTER — Ambulatory Visit (INDEPENDENT_AMBULATORY_CARE_PROVIDER_SITE_OTHER): Payer: Medicare Other | Admitting: Vascular Surgery

## 2020-08-17 VITALS — BP 133/74 | HR 67 | Temp 97.9°F | Resp 20 | Ht 63.0 in | Wt 138.0 lb

## 2020-08-17 DIAGNOSIS — I714 Abdominal aortic aneurysm, without rupture, unspecified: Secondary | ICD-10-CM

## 2020-08-17 DIAGNOSIS — I739 Peripheral vascular disease, unspecified: Secondary | ICD-10-CM

## 2020-08-17 DIAGNOSIS — I779 Disorder of arteries and arterioles, unspecified: Secondary | ICD-10-CM | POA: Diagnosis not present

## 2020-08-17 NOTE — Progress Notes (Signed)
REASON FOR VISIT:   Peripheral vascular disease with gangrene.  MEDICAL ISSUES:   PERIPHERAL VASCULAR DISEASE WITH GANGRENE: This patient developed atheroembolic disease to both feet with dry gangrene on the toes of both feet.  When I had seen her previously she noted that the discoloration of her toes had actually occurred before her open heart surgery which was done at [facility].  She had an atrial myxoma excised and has been on Eliquis.  She has atrial fibrillation.  She had evidence of infrainguinal arterial occlusive disease on exam and her arteriogram showed bilateral superficial femoral artery occlusions.  Given the dry gangrene of the toes of both feet as documented in the photographs below, I think her best chance for limb salvage is staged femoropopliteal bypass grafts.  She dialyzes on Tuesdays Thursdays and Saturdays so we will try to arrange this on a nondialysis day.  We will start on the left side of the toes are all involved on the left.  I think she is a candidate for a left femoral to above-knee popliteal artery bypass.  She tells me that her veins been taken from the left leg so this may have to be done with a prosthetic graft.  I have reviewed the indications for lower extremity bypass. I have also reviewed the potential complications of surgery including but not limited to: wound healing problems, infection, graft thrombosis, limb loss, or other unpredictable medical problems. All the patient's questions were answered and they are agreeable to proceed.  Once she has recovered from this we can consider staged right femoral to above-knee popliteal artery bypass grafting.  We can try to obtain a vein map on the right when she is in the hospital.  She is on Eliquis and we will hold this 48 hours prior to her surgery.  PARARENAL ABDOMINAL AORTIC ANEURYSM: The patient has a 4.9 cm pararenal abdominal aortic aneurysm.  If this enlarges significantly I think she would require a  fenestrated graft versus a thoracoabdominal repair and would have to be referred to [facility].  She prefers to have this followed here for now unless it enlarges significantly.  Her most recent scan showed no significant change in the size of the aneurysm.  HPI:   Kathryn Hancock is a pleasant 70 y.o. female who had presented with ischemic changes to both feet.  She had developed discoloration of the toes of both feet prior to having open heart surgery at [facility] to remove an atrial myxoma.  Postoperative course was complicated by atrial fibrillation.  She is on Eliquis.  Her toes have been stable but she continues to have dry gangrene of the tips of multiple toes and as documented in the photographs below.  She has some pain associated with the toes.  Her activity is limited and I do not get any clear-cut history of claudication.  She has chronic kidney disease which has progressed to end-stage renal disease and she was started on dialysis.  Therefore we proceeded with arteriography.  She underwent an arteriogram on 07/08/2020.   Past Medical History:  Diagnosis Date  . AAA (abdominal aortic aneurysm) ([place]) 06/2019   Multiple small pseudoaneurysmal projections of the dominant aorta.  Distal abdominal aortic aneurysm 4.5 x 4.7 cm.  Greatest AP dimension of the infrarenal aorta is 4.9.  No evidence of thoracic aortic aneurysm or dissection.  Brief segment of proximal IMA occlusion.  . Coronary artery calcification seen on CAT scan 06/2019  . Coronary artery disease involving  native coronary artery of native heart with unstable angina pectoris ([place]) 06/01/2020   Cardiac cath at [facility] 06/01/2020-preop for atrial myxoma resection -> severe proximal RCA and PDA -> had single-vessel CABG with SVG-PDA along with myxoma resection.  . DM (diabetes mellitus), type 2 with renal complications ([place]) 08/24/1759  . Dry gangrene ([place]) -> right foot-toes 05/27/2020  . ESRD (end stage renal disease) on dialysis ([place])  06/2020   Progression of CKDIIIb to ESRD initially related to thromboembolic event from left atrial myxoma; complicated by perioperative hypotension-; now 1 on HD TU/TH/SAT  . Heavy smoker (more than 20 cigarettes per day)    ~ 2 ppd; since age 25 (100 pk yr) = has cut down to one half PPD.>  . Hyperlipidemia    Mixed  . Hyperlipidemia associated with type 2 diabetes mellitus ([place]) 08/05/2020  . Hypertension   . 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 = Status post resection December 23,2021 (done at [facility] because of no bed availability at [facility]  . Microscopic hematuria   . PAF (paroxysmal atrial fibrillation) ([place]) 07/01/2020   Initially noted postoperatively-left atrial myxoma resection and CABG x1.  Now on amiodarone and apixaban..  . Plantar wart of right foot     Family History  Problem Relation Age of Onset  . Diabetes Mother   . Diabetes Father   . Heart attack Father 76  . CAD Father   . Hyperlipidemia Father   . Hypertension Father     SOCIAL HISTORY: Social History   Tobacco Use  . Smoking status: Current Every Day Smoker    Packs/day: 0.50    Years: 50.00    Pack years: 25.00    Types: Cigarettes  . Smokeless tobacco: Never Used  Substance Use Topics  . Alcohol use: Not Currently    Allergies  Allergen Reactions  . Penicillins Other (See Comments)    Childhood reaction.    Current Outpatient Medications  Medication Sig Dispense Refill  . acetaminophen (TYLENOL) 500 MG tablet Take 500-1,000 mg by mouth every 6 (six) hours as needed (for pain.).    Marland Kitchen amiodarone (PACERONE) 200 MG tablet Take 200 mg by mouth daily.    Marland Kitchen amLODipine (NORVASC) 5 MG tablet Take 5 mg by mouth daily.    Marland Kitchen apixaban (ELIQUIS) 5 MG TABS tablet Take 1 tablet (5 mg total) by mouth 2 (two) times daily. 60 tablet 6  . Ascorbic Acid (VITAMIN C) 1000 MG tablet Take 1,000 mg by mouth every evening.    Marland Kitchen  aspirin EC 81 MG tablet Take 81 mg by mouth daily. Swallow whole.    Marland Kitchen atorvastatin (LIPITOR) 20 MG tablet Take 20 mg by mouth daily.    . fluticasone (FLONASE) 50 MCG/ACT nasal spray Place 2 sprays into both nostrils as needed for allergies or rhinitis.    . folic acid (FOLVITE) 607 MCG tablet Take 800 mcg by mouth daily.    Marland Kitchen glimepiride (AMARYL) 1 MG tablet Take 1 mg by mouth as needed.    Marland Kitchen glucose blood test strip 1 each by Other route as needed. Use as instructed    . montelukast (SINGULAIR) 10 MG tablet Take 10 mg by mouth daily as needed (allergies.).    Marland Kitchen oxyCODONE (OXY IR/ROXICODONE) 5 MG immediate release tablet Take 5-10 mg by mouth every 6 (six) hours as needed for moderate pain.    . Vitamin D3 (VITAMIN D) 25  MCG tablet Take 2,000 Units by mouth every evening.    . carvedilol (COREG) 6.25 MG tablet Take 1 tablet (6.25 mg total) by mouth 2 (two) times daily with a meal. 60 tablet 0   No current facility-administered medications for this visit.    REVIEW OF SYSTEMS:  [X]  denotes positive finding, [ ]  denotes negative finding Cardiac  Comments:  Chest pain or chest pressure:    Shortness of breath upon exertion: x   Short of breath when lying flat:    Irregular heart rhythm:        Vascular    Pain in calf, thigh, or hip brought on by ambulation:    Pain in feet at night that wakes you up from your sleep:     Blood clot in your veins:    Leg swelling:         Pulmonary    Oxygen at home:    Productive cough:     Wheezing:         Neurologic    Sudden weakness in arms or legs:     Sudden numbness in arms or legs:     Sudden onset of difficulty speaking or slurred speech:    Temporary loss of vision in one eye:     Problems with dizziness:         Gastrointestinal    Blood in stool:     Vomited blood:         Genitourinary    Burning when urinating:     Blood in urine:        Psychiatric    Major depression:         Hematologic    Bleeding problems:     Problems with blood clotting too easily:        Skin    Rashes or ulcers: x       Constitutional    Fever or chills:     PHYSICAL EXAM:   Vitals:   08/17/20 1451  BP: 133/74  Pulse: 67  Resp: 20  Temp: 97.9 F (36.6 C)  SpO2: 98%  Weight: 62.6 kg  Height: 5\' 3"  (1.6 m)    GENERAL: The patient is a well-nourished female, in no acute distress. The vital signs are documented above. CARDIAC: There is a regular rate and rhythm.  VASCULAR: I do not detect carotid bruits. She has palpable femoral pulses. He has monophasic Doppler signals in both feet. PULMONARY: There is good air exchange bilaterally without wheezing or rales. ABDOMEN: Soft and non-tender with normal pitched bowel sounds.  MUSCULOSKELETAL: There are no major deformities or cyanosis. NEUROLOGIC: No focal weakness or paresthesias are detected. SKIN: She has dry gangrene of the toes of both feet as documented in the photographs below.      PSYCHIATRIC: The patient has a normal affect.  DATA:    ARTERIAL DOPPLER STUDY: I have independently interpreted her arterial Doppler study today.  On the right side there is a monophasic dorsalis pedis and posterior tibial signal.  ABI 65%.  Toe pressures 0.  On the left side there is a monophasic dorsalis pedis and posterior tibial signal.  ABI is 61% and toe pressures is 0.  ARTERIOGRAM: I have reviewed her previous arteriogram from 07/08/2020.  This showed bilateral superficial femoral artery occlusions with reconstitution of the above-knee popliteal arteries bilaterally.  There is two-vessel runoff bilaterally via the peroneal and posterior tibial arteries.  Deitra Mayo [facility]  Office [phone]

## 2020-08-19 ENCOUNTER — Encounter (HOSPITAL_COMMUNITY): Payer: Self-pay | Admitting: Vascular Surgery

## 2020-08-19 ENCOUNTER — Other Ambulatory Visit (HOSPITAL_COMMUNITY)
Admission: RE | Admit: 2020-08-19 | Discharge: 2020-08-19 | Disposition: A | Discharge status: Home / Self Care | Payer: Medicare Other | Source: Ambulatory Visit | Attending: Vascular Surgery | Admitting: Vascular Surgery

## 2020-08-19 ENCOUNTER — Other Ambulatory Visit: Payer: Self-pay

## 2020-08-19 DIAGNOSIS — U071 COVID-19: Secondary | ICD-10-CM | POA: Insufficient documentation

## 2020-08-19 DIAGNOSIS — Z01812 Encounter for preprocedural laboratory examination: Secondary | ICD-10-CM | POA: Diagnosis present

## 2020-08-19 LAB — SARS CORONAVIRUS 2 (TAT 6-24 HRS): SARS Coronavirus 2: POSITIVE — AB

## 2020-08-19 NOTE — Progress Notes (Signed)
PCP - Spero Curb, MD Cardiologist Ellyn Hack, MD  Chest x-ray -  EKG - 11/02/20 Stress Test -  ECHO - 07/24/19 Cardiac Cath -   Fasting Blood Sugar:   Checks Blood Sugar:  2x/day  Blood Thinner Instructions: Eliquis - per pt daughter and pt, instructed last dose 08/19/20 Aspirin Instructions: can continue ASA per instructions   ERAS Protcol -   COVID TEST- 08/19/20  Anesthesia review: yes   -------------  SDW INSTRUCTIONS:  Your procedure is scheduled on 08/22/20. Please report to [facility] Main Entrance "A" at 05:30 A.M., and check in at the Admitting office. Call this number if you have problems the morning of surgery: [phone]   Remember: Do not eat or drink after midnight the night before your surgery   Medications to take morning of surgery with a sip of water include: amiodarone (PACERONE)  amLODipine (NORVASC)  aspirin EC atorvastatin (LIPITOR)  carvedilol (COREG)  fluticasone (FLONASE)  montelukast (SINGULAIR)   If needed: acetaminophen (TYLENOL) famotidine (PEPCID)  fluticasone (FLONASE) oxyCODONE (OXY IR/ROXICODONE)  ** PLEASE check your blood sugar the morning of your surgery when you wake up and every 2 hours until you get to the Short Stay unit.  If your blood sugar is less than 70 mg/dL, you will need to treat for low blood sugar: - Do not take insulin. - Treat a low blood sugar (less than 70 mg/dL) with  cup of clear juice (cranberry or apple), 4 glucose tablets, OR glucose gel. - Recheck blood sugar in 15 minutes after treatment (to make sure it is greater than 70 mg/dL). If your blood sugar is not greater than 70 mg/dL on recheck, call [phone] for further instructions.   As of today, STOP taking any Aspirin (unless otherwise instructed by your surgeon), Aleve, Naproxen, Ibuprofen, Motrin, Advil, Goody's, BC's, all herbal medications, fish oil, and all vitamins.    The Morning of Surgery Do not wear jewelry, make-up or nail polish. Do not  wear lotions, powders, or perfumes, or deodorant Do not shave 48 hours prior to surgery.   Do not bring valuables to the hospital. [facility] is not responsible for any belongings or valuables. If you are a smoker, DO NOT Smoke 24 hours prior to surgery If you wear a CPAP at night please bring your mask the morning of surgery  Remember that you must have someone to transport you home after your surgery, and remain with you for 24 hours if you are discharged the same day. Please bring cases for contacts, glasses, hearing aids, dentures or bridgework because it cannot be worn into surgery.   Patients discharged the day of surgery will not be allowed to drive home.   Please shower the NIGHT BEFORE SURGERY and the MORNING OF SURGERY with DIAL Soap. Wear comfortable clothes the morning of surgery. Oral Hygiene is also important to reduce your risk of infection.  Remember - BRUSH YOUR TEETH THE MORNING OF SURGERY WITH YOUR REGULAR TOOTHPASTE  Patient denies shortness of breath, fever, cough and chest pain.

## 2020-08-20 ENCOUNTER — Telehealth: Payer: Self-pay | Admitting: Unknown Physician Specialty

## 2020-08-20 NOTE — Telephone Encounter (Signed)
Called to discuss with patient about COVID-19 symptoms and the use of one of the available treatments for those with mild to moderate Covid symptoms and at a high risk of hospitalization.  Pt appears to qualify for outpatient treatment due to co-morbid conditions and/or a member of an at-risk group in accordance with the FDA Emergency Use Authorization.    Asymptomatic.  Tested positive on routine screening. Gave hotline number in case she develops symptoms  Kathryn Hancock

## 2020-08-22 ENCOUNTER — Ambulatory Visit (HOSPITAL_COMMUNITY)
Admission: RE | Admit: 2020-08-22 | Discharge: 2020-08-22 | Disposition: A | Discharge status: Home / Self Care | Payer: Medicare Other | Attending: Vascular Surgery | Admitting: Vascular Surgery

## 2020-08-22 ENCOUNTER — Telehealth (HOSPITAL_COMMUNITY): Payer: Self-pay

## 2020-08-22 ENCOUNTER — Telehealth: Payer: Self-pay

## 2020-08-22 DIAGNOSIS — U071 COVID-19: Secondary | ICD-10-CM | POA: Insufficient documentation

## 2020-08-22 NOTE — Progress Notes (Signed)
Patient was not made aware that case was canceled and arrived to hospital this morning as originally instructed.  Confirmed with Dr. Scot Dock that case was canceled.  Patient in lobby with daughter and was informed that case would need to be rescheduled due to being covid positive.  Patient verbalized understanding and aware that she would need to reach out to the office.  Patient given saltine crackers and a sprite.

## 2020-08-22 NOTE — Telephone Encounter (Signed)
Spoke with pts daughter Curt Bears regarding the need to reschedule pt for left femoral-above knee popliteal bypass due to covid positive test on 08/19/20. Offered to reschedule surgery in 10 days since daughter denied pt having any covid symptoms. Daughter opted to reschedule on 09/12/20 and verbalized understanding to instructions provided. An instructions letter will also be mailed to pt.

## 2020-08-31 ENCOUNTER — Other Ambulatory Visit: Payer: Self-pay

## 2020-08-31 ENCOUNTER — Ambulatory Visit (HOSPITAL_COMMUNITY): Payer: Medicare Other | Attending: Internal Medicine

## 2020-08-31 DIAGNOSIS — I714 Abdominal aortic aneurysm, without rupture, unspecified: Secondary | ICD-10-CM

## 2020-08-31 DIAGNOSIS — E1129 Type 2 diabetes mellitus with other diabetic kidney complication: Secondary | ICD-10-CM

## 2020-08-31 DIAGNOSIS — Z720 Tobacco use: Secondary | ICD-10-CM | POA: Diagnosis present

## 2020-08-31 DIAGNOSIS — N186 End stage renal disease: Secondary | ICD-10-CM

## 2020-08-31 DIAGNOSIS — Z992 Dependence on renal dialysis: Secondary | ICD-10-CM | POA: Diagnosis present

## 2020-08-31 DIAGNOSIS — I10 Essential (primary) hypertension: Secondary | ICD-10-CM | POA: Diagnosis present

## 2020-08-31 DIAGNOSIS — D151 Benign neoplasm of heart: Secondary | ICD-10-CM

## 2020-08-31 DIAGNOSIS — E785 Hyperlipidemia, unspecified: Secondary | ICD-10-CM | POA: Diagnosis present

## 2020-08-31 DIAGNOSIS — E1169 Type 2 diabetes mellitus with other specified complication: Secondary | ICD-10-CM

## 2020-08-31 DIAGNOSIS — I480 Paroxysmal atrial fibrillation: Secondary | ICD-10-CM

## 2020-08-31 HISTORY — PX: TRANSTHORACIC ECHOCARDIOGRAM: SHX275

## 2020-08-31 LAB — ECHOCARDIOGRAM COMPLETE
Area-P 1/2: 2.25 cm2
P 1/2 time: 836 msec
S' Lateral: 2.8 cm

## 2020-09-06 ENCOUNTER — Telehealth: Payer: Self-pay | Admitting: *Deleted

## 2020-09-06 NOTE — Progress Notes (Addendum)
Surgical Instructions    Your procedure is scheduled on 09/12/20.  Report to [facility] Main Entrance "A" at 05:30 A.M., then check in with the Admitting office.  Call this number if you have problems the morning of surgery:  [phone]   If you have any questions prior to your surgery date call [phone]: Open Monday-Friday 8am-4pm    Remember:  Do not eat or drink after midnight the night before your surgery     Take these medicines the morning of surgery with A SIP OF WATER  acetaminophen (TYLENOL) if needed amiodarone (PACERONE)  amLODipine (NORVASC) atorvastatin (LIPITOR)  carvedilol (COREG)  famotidine (PEPCID) if needed fluticasone (FLONASE) if needed montelukast (SINGULAIR)  If needed oxyCODONE (OXY IR/ROXICODONE)   WHAT DO I DO ABOUT MY DIABETES MEDICATION?   Marland Kitchen Do not take oral diabetes medicines (pills) the morning of surgery.   . THE DAY BEFORE SURGERY, take your morning dose of glimepiride (AMARYL). Do not take your evening dose of glimepiride (AMARYL).  . THE MORNING OF SURGERY, do not take glimepiride (AMARYL).  . The day of surgery, do not take other diabetes injectables, including Byetta (exenatide), Bydureon (exenatide ER), Victoza (liraglutide), or Trulicity (dulaglutide).  . If your CBG is greater than 220 mg/dL, you may take  of your sliding scale (correction) dose of insulin.  As of today, STOP taking any (unless otherwise instructed by your surgeon) Aleve, Naproxen, Ibuprofen, Motrin, Advil, Goody's, BC's, all herbal medications, fish oil, and all vitamins. Please stop taking apixaban (ELIQUIS) 2 days prior to surgery. Your last dose will be 09/09/20. Continue taking your aspirin.      HOW TO MANAGE YOUR DIABETES BEFORE AND AFTER SURGERY  Why is it important to control my blood sugar before and after surgery? . Improving blood sugar levels before and after surgery helps healing and can limit problems. . A way of improving blood sugar control is  eating a healthy diet by: o  Eating less sugar and carbohydrates o  Increasing activity/exercise o  Talking with your doctor about reaching your blood sugar goals . High blood sugars (greater than 180 mg/dL) can raise your risk of infections and slow your recovery, so you will need to focus on controlling your diabetes during the weeks before surgery. . Make sure that the doctor who takes care of your diabetes knows about your planned surgery including the date and location.  How do I manage my blood sugar before surgery? . Check your blood sugar at least 4 times a day, starting 2 days before surgery, to make sure that the level is not too high or low. . Check your blood sugar the morning of your surgery when you wake up and every 2 hours until you get to the Short Stay unit. o If your blood sugar is less than 70 mg/dL, you will need to treat for low blood sugar: - Do not take insulin. - Treat a low blood sugar (less than 70 mg/dL) with  cup of clear juice (cranberry or apple), 4 glucose tablets, OR glucose gel. - Recheck blood sugar in 15 minutes after treatment (to make sure it is greater than 70 mg/dL). If your blood sugar is not greater than 70 mg/dL on recheck, call [phone] for further instructions. . Report your blood sugar to the short stay nurse when you get to Short Stay.  . If you are admitted to the hospital after surgery: o Your blood sugar will be checked by the staff and you will  probably be given insulin after surgery (instead of oral diabetes medicines) to make sure you have good blood sugar levels. o The goal for blood sugar control after surgery is 80-180 mg/dL.                      Do not wear jewelry, make up, or nail polish            Do not wear lotions, powders, perfumes/colognes, or deodorant.            Do not shave 48 hours prior to surgery.  Men may shave face and neck.            Do not bring valuables to the hospital.            [facility] is not  responsible for any belongings or valuables.  Do NOT Smoke (Tobacco/Vaping) or drink Alcohol 24 hours prior to your procedure If you use a CPAP at night, you may bring all equipment for your overnight stay.   Contacts, glasses, dentures or bridgework may not be worn into surgery, please bring cases for these belongings   For patients admitted to the hospital, discharge time will be determined by your treatment team.   Patients discharged the day of surgery will not be allowed to drive home, and someone needs to stay with them for 24 hours.    Special instructions:   [facility]- Preparing For Surgery  Before surgery, you can play an important role. Because skin is not sterile, your skin needs to be as free of germs as possible. You can reduce the number of germs on your skin by washing with CHG (chlorahexidine gluconate) Soap before surgery.  CHG is an antiseptic cleaner which kills germs and bonds with the skin to continue killing germs even after washing.    Oral Hygiene is also important to reduce your risk of infection.  Remember - BRUSH YOUR TEETH THE MORNING OF SURGERY WITH YOUR REGULAR TOOTHPASTE  Please do not use if you have an allergy to CHG or antibacterial soaps. If your skin becomes reddened/irritated stop using the CHG.  Do not shave (including legs and underarms) for at least 48 hours prior to first CHG shower. It is OK to shave your face.  Please follow these instructions carefully.   1. Shower the NIGHT BEFORE SURGERY and the MORNING OF SURGERY  2. If you chose to wash your hair, wash your hair first as usual with your normal shampoo.  3. After you shampoo, rinse your hair and body thoroughly to remove the shampoo.  4. Wash Face and genitals (private parts) with your normal soap.   5.  Shower the NIGHT BEFORE SURGERY and the MORNING OF SURGERY with CHG Soap.   6. Use CHG Soap as you would any other liquid soap. You can apply CHG directly to the skin and wash gently  with a scrungie or a clean washcloth.   7. Apply the CHG Soap to your body ONLY FROM THE NECK DOWN.  Do not use on open wounds or open sores. Avoid contact with your eyes, ears, mouth and genitals (private parts). Wash Face and genitals (private parts)  with your normal soap.   8. Wash thoroughly, paying special attention to the area where your surgery will be performed.  9. Thoroughly rinse your body with warm water from the neck down.  10. DO NOT shower/wash with your normal soap after using and rinsing off the CHG Soap.  11. Pat yourself dry with a CLEAN TOWEL.  12. Wear CLEAN PAJAMAS to bed the night before surgery  13. Place CLEAN SHEETS on your bed the night before your surgery  14. DO NOT SLEEP WITH PETS.   Day of Surgery: Take a shower with CHG soap.  Wear Clean/Comfortable clothing the morning of surgery Do not apply any deodorants/lotions.   Remember to brush your teeth WITH YOUR REGULAR TOOTHPASTE.   Please read over the following fact sheets that you were given.

## 2020-09-06 NOTE — Telephone Encounter (Signed)
Per  Dr Ellyn Hack - would like to for patient  Increase  Carvedilol to 12. 5 mg twice a day  And decrease  Amiodarone to 100 mg daily .    Left message to find out which dialysis center patient goes to  - will need to track down  labwork that was done in Feb 2022

## 2020-09-07 ENCOUNTER — Ambulatory Visit (HOSPITAL_COMMUNITY)
Admission: RE | Admit: 2020-09-07 | Discharge: 2020-09-07 | Disposition: A | Discharge status: Home / Self Care | Payer: Medicare Other | Source: Ambulatory Visit | Attending: Vascular Surgery | Admitting: Vascular Surgery

## 2020-09-07 ENCOUNTER — Ambulatory Visit (INDEPENDENT_AMBULATORY_CARE_PROVIDER_SITE_OTHER): Payer: Medicare Other | Admitting: Physician Assistant

## 2020-09-07 ENCOUNTER — Encounter: Payer: Self-pay | Admitting: *Deleted

## 2020-09-07 ENCOUNTER — Encounter (HOSPITAL_COMMUNITY)
Admission: RE | Admit: 2020-09-07 | Discharge: 2020-09-07 | Disposition: A | Discharge status: Home / Self Care | Payer: Medicare Other | Source: Ambulatory Visit | Attending: Vascular Surgery | Admitting: Vascular Surgery

## 2020-09-07 ENCOUNTER — Other Ambulatory Visit: Payer: Self-pay | Admitting: *Deleted

## 2020-09-07 ENCOUNTER — Encounter (HOSPITAL_COMMUNITY): Payer: Self-pay

## 2020-09-07 ENCOUNTER — Telehealth: Payer: Self-pay

## 2020-09-07 ENCOUNTER — Other Ambulatory Visit: Payer: Self-pay

## 2020-09-07 VITALS — BP 121/65 | HR 67 | Temp 97.3°F | Resp 20 | Ht 63.0 in | Wt 128.0 lb

## 2020-09-07 DIAGNOSIS — Z01812 Encounter for preprocedural laboratory examination: Secondary | ICD-10-CM | POA: Insufficient documentation

## 2020-09-07 DIAGNOSIS — Z7982 Long term (current) use of aspirin: Secondary | ICD-10-CM | POA: Insufficient documentation

## 2020-09-07 DIAGNOSIS — N186 End stage renal disease: Secondary | ICD-10-CM | POA: Insufficient documentation

## 2020-09-07 DIAGNOSIS — Z7901 Long term (current) use of anticoagulants: Secondary | ICD-10-CM | POA: Insufficient documentation

## 2020-09-07 DIAGNOSIS — N184 Chronic kidney disease, stage 4 (severe): Secondary | ICD-10-CM

## 2020-09-07 DIAGNOSIS — Z992 Dependence on renal dialysis: Secondary | ICD-10-CM | POA: Insufficient documentation

## 2020-09-07 DIAGNOSIS — I739 Peripheral vascular disease, unspecified: Secondary | ICD-10-CM | POA: Insufficient documentation

## 2020-09-07 DIAGNOSIS — N390 Urinary tract infection, site not specified: Secondary | ICD-10-CM

## 2020-09-07 DIAGNOSIS — Z951 Presence of aortocoronary bypass graft: Secondary | ICD-10-CM | POA: Insufficient documentation

## 2020-09-07 HISTORY — DX: Other specified postprocedural states: Z98.890

## 2020-09-07 HISTORY — DX: Depression, unspecified: F32.A

## 2020-09-07 HISTORY — DX: Peripheral vascular disease, unspecified: I73.9

## 2020-09-07 HISTORY — DX: Dyspnea, unspecified: R06.00

## 2020-09-07 HISTORY — DX: Anemia, unspecified: D64.9

## 2020-09-07 HISTORY — DX: Other specified postprocedural states: R11.2

## 2020-09-07 HISTORY — DX: Gastro-esophageal reflux disease without esophagitis: K21.9

## 2020-09-07 LAB — URINALYSIS, ROUTINE W REFLEX MICROSCOPIC
Bilirubin Urine: NEGATIVE
Glucose, UA: NEGATIVE mg/dL
Hgb urine dipstick: NEGATIVE
Ketones, ur: NEGATIVE mg/dL
Nitrite: NEGATIVE
Protein, ur: 100 mg/dL — AB
Specific Gravity, Urine: 1.026 (ref 1.005–1.030)
WBC, UA: >50 WBC/hpf — ABNORMAL HIGH (ref 0–5)
pH: 5 (ref 5.0–8.0)

## 2020-09-07 LAB — COMPREHENSIVE METABOLIC PANEL
ALT: 10 U/L (ref 0–44)
AST: 16 U/L (ref 15–41)
Albumin: 2.7 g/dL — ABNORMAL LOW (ref 3.5–5.0)
Alkaline Phosphatase: 192 U/L — ABNORMAL HIGH (ref 38–126)
Anion gap: 11 (ref 5–15)
BUN: 12 mg/dL (ref 8–23)
CO2: 27 mmol/L (ref 22–32)
Calcium: 8.8 mg/dL — ABNORMAL LOW (ref 8.9–10.3)
Chloride: 97 mmol/L — ABNORMAL LOW (ref 98–111)
Creatinine, Ser: 2.86 mg/dL — ABNORMAL HIGH (ref 0.44–1.00)
GFR, Estimated: 17 mL/min — ABNORMAL LOW (ref >60)
Glucose, Bld: 131 mg/dL — ABNORMAL HIGH (ref 70–99)
Potassium: 3.9 mmol/L (ref 3.5–5.1)
Sodium: 135 mmol/L (ref 135–145)
Total Bilirubin: 0.4 mg/dL (ref 0.3–1.2)
Total Protein: 7.1 g/dL (ref 6.5–8.1)

## 2020-09-07 LAB — CBC
HCT: 40.5 % (ref 36.0–46.0)
Hemoglobin: 12.5 g/dL (ref 12.0–15.0)
MCH: 30.8 pg (ref 26.0–34.0)
MCHC: 30.9 g/dL (ref 30.0–36.0)
MCV: 99.8 fL (ref 80.0–100.0)
Platelets: 328 10*3/uL (ref 150–400)
RBC: 4.06 MIL/uL (ref 3.87–5.11)
RDW: 16.2 % — ABNORMAL HIGH (ref 11.5–15.5)
WBC: 16 10*3/uL — ABNORMAL HIGH (ref 4.0–10.5)
nRBC: 0 % (ref 0.0–0.2)

## 2020-09-07 LAB — APTT: aPTT: 40 seconds — ABNORMAL HIGH (ref 24–36)

## 2020-09-07 LAB — SURGICAL PCR SCREEN
MRSA, PCR: NEGATIVE
Staphylococcus aureus: NEGATIVE

## 2020-09-07 LAB — PROTIME-INR
INR: 1.8 — ABNORMAL HIGH (ref 0.8–1.2)
Prothrombin Time: 20 seconds — ABNORMAL HIGH (ref 11.4–15.2)

## 2020-09-07 LAB — HEMOGLOBIN A1C
Hgb A1c MFr Bld: 5.5 % (ref 4.8–5.6)
Mean Plasma Glucose: 111.15 mg/dL

## 2020-09-07 LAB — GLUCOSE, CAPILLARY: Glucose-Capillary: 160 mg/dL — ABNORMAL HIGH (ref 70–99)

## 2020-09-07 MED ORDER — SULFAMETHOXAZOLE-TRIMETHOPRIM 400-80 MG PO TABS
1.0000 | ORAL_TABLET | Freq: Two times a day (BID) | ORAL | 0 refills | Status: DC
Start: 2020-09-07 — End: 2020-09-14

## 2020-09-07 NOTE — Progress Notes (Signed)
Notified Nyokea Breedlove of abnormal PT/INR/APTT/WBC and U/A. She will notify MD Scot Dock) of results.

## 2020-09-07 NOTE — Progress Notes (Signed)
PCP - Sylvester Harder [facility] Cardiologist - Glenetta Hew Nephrologist: Rocco Serene, MD- [facility]  PPM/ICD - denies   Chest x-ray - n/a EKG - 08/05/20 Stress Test -denies  ECHO - 08/31/20 Cardiac Cath - 06/01/20  Sleep Study - denies   Fasting Blood Sugar - 110-120 Checks Blood Sugar once a day  Patient instructed to hold all Aspirin, NSAID's, herbal medications, fish oil and vitamins 7 days prior to surgery. Instructed patient to hold eliquis 2 days prior to surgery.    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 Records in care everywhere. Dialysis as well.   Patient denies shortness of breath, fever, cough and chest pain at PAT appointment   All instructions explained to the patient, with a verbal understanding of the material. Patient agrees to go over the instructions while at home for a better understanding. Patient also instructed to self quarantine after being tested for COVID-19. The opportunity to ask questions was provided.

## 2020-09-07 NOTE — Telephone Encounter (Signed)
Call received from [facility] preadmission testing to report abnormal labs:  WBC 16, PT 20, INR 1.8, APTT 40, Lg Leukocytes, many bacteria. Pt reported feelings of UTI. Pt sched for left femoral-above knee popliteal bypass 09/12/20 with Dr. Scot Dock.  Spoke with pts daughter regarding above. Antibiotic Rx sent to local pharmacy.   Will forward message to Dr. Trula Slade to review labs in Dr. Nicole Cella absence for any additional recs.

## 2020-09-07 NOTE — Progress Notes (Signed)
POST OPERATIVE DIALYSIS ACCESS OFFICE NOTE    CC:  F/u for dialysis access surgery; 2 months postop  HPI:  This is a 70 y.o. female who is s/p left first stage brachiobasilic upper extremity AV fistula on July 11, 2020 by Dr. Carlis Abbott.  She is dialyzing via tunneled dialysis catheter. She denies left hand pain.  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 She is scheduled for left femoral to above-knee popliteal artery bypass graft on September 12, 2020 with dr. Scot Dock.  Dialysis days:  TTS  Dialysis center: [facility]  Allergies  Allergen Reactions  . Penicillins Other (See Comments)    Childhood reaction.    Current Outpatient Medications  Medication Sig Dispense Refill  . acetaminophen (TYLENOL) 500 MG tablet Take 500-1,000 mg by mouth every 6 (six) hours as needed (for pain.).    Marland Kitchen amiodarone (PACERONE) 200 MG tablet Take 200 mg by mouth daily.    Marland Kitchen amLODipine (NORVASC) 5 MG tablet Take 5 mg by mouth daily.    Marland Kitchen apixaban (ELIQUIS) 5 MG TABS tablet Take 1 tablet (5 mg total) by mouth 2 (two) times daily. 60 tablet 6  . Ascorbic Acid (VITAMIN C) 1000 MG tablet Take 1,000 mg by mouth every evening.    Marland Kitchen aspirin EC 81 MG tablet Take 81 mg by mouth daily. Swallow whole.    Marland Kitchen atorvastatin (LIPITOR) 20 MG tablet Take 20 mg by mouth daily.    Marland Kitchen docusate sodium (COLACE) 100 MG capsule Take 100 mg by mouth daily as needed for mild constipation.    . famotidine (PEPCID) 20 MG tablet Take 20 mg by mouth daily as needed for headache.    . fluticasone (FLONASE) 50 MCG/ACT nasal spray Place 2 sprays into both nostrils as needed for allergies or rhinitis.    . folic acid (FOLVITE) 629 MCG tablet Take 800 mcg by mouth daily.    Marland Kitchen glimepiride (AMARYL) 1 MG tablet Take 1 mg by mouth 2 (two) times daily. Hold if blood sugar is less than 120    . glucose blood test strip 1 each by Other route as  needed. Use as instructed    . lidocaine-prilocaine (EMLA) cream Apply 1 application topically daily as needed (dialysis).    Marland Kitchen loperamide (IMODIUM A-D) 2 MG tablet Take 2 mg by mouth 4 (four) times daily as needed for diarrhea or loose stools.    . montelukast (SINGULAIR) 10 MG tablet Take 10 mg by mouth daily as needed (allergies.).    Marland Kitchen oxyCODONE (OXY IR/ROXICODONE) 5 MG immediate release tablet Take 5-10 mg by mouth 2 (two) times daily as needed for moderate pain.    Marland Kitchen sulfamethoxazole-trimethoprim (BACTRIM) 400-80 MG tablet Take 1 tablet by mouth 2 (two) times daily. 10 tablet 0  . Vitamin D3 (VITAMIN D) 25 MCG tablet Take 2,000 Units by mouth every evening.    . carvedilol (COREG) 6.25 MG tablet Take 1 tablet (6.25 mg total) by mouth 2 (two) times daily with a meal. 60 tablet 0   No current facility-administered medications for this visit.     ROS:  See HPI  BP 121/65 (BP Location: Right Arm, Patient Position: Sitting, Cuff Size: Normal)   Pulse 67   Temp (!) 97.3 F (36.3 C) (Temporal)   Resp 20   Ht 5\' 3"  (1.6 m)   Wt 128 lb (58.1 kg)   SpO2 100%   BMI  22.67 kg/m    Physical Exam:  General appearance:in NAD Cardiac:RRR Respiratory:nonlabored Incision:  Well healed Extremities:  LUE: good thrill and bruit in fistula. 5/5 hand grip strength. Hand is warm. Cannot palpate radial pulse  Dialysis duplex on 09/07/2020 Summary:  Patent BVT.  Branch in the distal upper arm.  Narrow vein at the ante cubitum and in the proximal upper arm with no  hemodynamically significant stenosis.   Assessment/Plan:   -pt does not have evidence of steal syndrome -dialysis duplex today reveals fistula to be of adequate diameter for transposition -she is anxious to proceed with 2nd stage. I advised her to wait at least 2-3 weeks from LLE revascularization before proceeding  -Barbie Banner, PA-C 09/07/2020 1:55 PM [facility] [phone]  Clinic MD:  Trula Slade  on-call

## 2020-09-07 NOTE — H&P (View-Only) (Signed)
POST OPERATIVE DIALYSIS ACCESS OFFICE NOTE    CC:  F/u for dialysis access surgery; 2 months postop  HPI:  This is a 70 y.o. female who is s/p left first stage brachiobasilic upper extremity AV fistula on July 11, 2020 by Dr. Carlis Abbott.  She is dialyzing via tunneled dialysis catheter. She denies left hand pain.  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 She is scheduled for left femoral to above-knee popliteal artery bypass graft on September 12, 2020 with dr. Scot Dock.  Dialysis days:  TTS  Dialysis center: [facility]  Allergies  Allergen Reactions  . Penicillins Other (See Comments)    Childhood reaction.    Current Outpatient Medications  Medication Sig Dispense Refill  . acetaminophen (TYLENOL) 500 MG tablet Take 500-1,000 mg by mouth every 6 (six) hours as needed (for pain.).    Marland Kitchen amiodarone (PACERONE) 200 MG tablet Take 200 mg by mouth daily.    Marland Kitchen amLODipine (NORVASC) 5 MG tablet Take 5 mg by mouth daily.    Marland Kitchen apixaban (ELIQUIS) 5 MG TABS tablet Take 1 tablet (5 mg total) by mouth 2 (two) times daily. 60 tablet 6  . Ascorbic Acid (VITAMIN C) 1000 MG tablet Take 1,000 mg by mouth every evening.    Marland Kitchen aspirin EC 81 MG tablet Take 81 mg by mouth daily. Swallow whole.    Marland Kitchen atorvastatin (LIPITOR) 20 MG tablet Take 20 mg by mouth daily.    Marland Kitchen docusate sodium (COLACE) 100 MG capsule Take 100 mg by mouth daily as needed for mild constipation.    . famotidine (PEPCID) 20 MG tablet Take 20 mg by mouth daily as needed for headache.    . fluticasone (FLONASE) 50 MCG/ACT nasal spray Place 2 sprays into both nostrils as needed for allergies or rhinitis.    . folic acid (FOLVITE) 425 MCG tablet Take 800 mcg by mouth daily.    Marland Kitchen glimepiride (AMARYL) 1 MG tablet Take 1 mg by mouth 2 (two) times daily. Hold if blood sugar is less than 120    . glucose blood test strip 1 each by Other route as  needed. Use as instructed    . lidocaine-prilocaine (EMLA) cream Apply 1 application topically daily as needed (dialysis).    Marland Kitchen loperamide (IMODIUM A-D) 2 MG tablet Take 2 mg by mouth 4 (four) times daily as needed for diarrhea or loose stools.    . montelukast (SINGULAIR) 10 MG tablet Take 10 mg by mouth daily as needed (allergies.).    Marland Kitchen oxyCODONE (OXY IR/ROXICODONE) 5 MG immediate release tablet Take 5-10 mg by mouth 2 (two) times daily as needed for moderate pain.    Marland Kitchen sulfamethoxazole-trimethoprim (BACTRIM) 400-80 MG tablet Take 1 tablet by mouth 2 (two) times daily. 10 tablet 0  . Vitamin D3 (VITAMIN D) 25 MCG tablet Take 2,000 Units by mouth every evening.    . carvedilol (COREG) 6.25 MG tablet Take 1 tablet (6.25 mg total) by mouth 2 (two) times daily with a meal. 60 tablet 0   No current facility-administered medications for this visit.     ROS:  See HPI  BP 121/65 (BP Location: Right Arm, Patient Position: Sitting, Cuff Size: Normal)   Pulse 67   Temp (!) 97.3 F (36.3 C) (Temporal)   Resp 20   Ht 5\' 3"  (1.6 m)   Wt 128 lb (58.1 kg)   SpO2 100%   BMI  22.67 kg/m    Physical Exam:  General appearance:in NAD Cardiac:RRR Respiratory:nonlabored Incision:  Well healed Extremities:  LUE: good thrill and bruit in fistula. 5/5 hand grip strength. Hand is warm. Cannot palpate radial pulse  Dialysis duplex on 09/07/2020 Summary:  Patent BVT.  Branch in the distal upper arm.  Narrow vein at the ante cubitum and in the proximal upper arm with no  hemodynamically significant stenosis.   Assessment/Plan:   -pt does not have evidence of steal syndrome -dialysis duplex today reveals fistula to be of adequate diameter for transposition -she is anxious to proceed with 2nd stage. I advised her to wait at least 2-3 weeks from LLE revascularization before proceeding  -Barbie Banner, PA-C 09/07/2020 1:55 PM [facility] [phone]  Clinic MD:  Trula Slade  on-call

## 2020-09-08 NOTE — Anesthesia Preprocedure Evaluation (Addendum)
Anesthesia Evaluation  Patient identified by MRN, date of birth, ID band Patient awake    Reviewed: Allergy & Precautions, NPO status , Patient's Chart, lab work & pertinent test results  History of Anesthesia Complications Negative for: history of anesthetic complications  Airway Mallampati: II  TM Distance: >3 FB Neck ROM: Full    Dental  (+) Edentulous Upper, Edentulous Lower, Dental Advisory Given   Pulmonary shortness of breath, Current Smoker and Patient abstained from smoking.,    breath sounds clear to auscultation       Cardiovascular hypertension, (-) angina+ Peripheral Vascular Disease  + dysrhythmias Atrial Fibrillation  Rhythm:Regular  1. Left ventricular ejection fraction, by estimation, is 55 to 60%. The  left ventricle has normal function. The left ventricle has no regional  wall motion abnormalities. Left ventricular diastolic parameters are  consistent with Grade I diastolic  dysfunction (impaired relaxation).  2. Right ventricular systolic function is mildly reduced. The right  ventricular size is normal. There is normal pulmonary artery systolic  pressure. The estimated right ventricular systolic pressure is 78.9 mmHg.  3. The mitral valve is grossly normal. Trivial mitral valve  regurgitation.  4. The aortic valve is tricuspid. Aortic valve regurgitation is trivial.  Aortic regurgitation PHT measures 836 msec.  5. The inferior vena cava is normal in size with greater than 50%  respiratory variability, suggesting right atrial pressure of 3 mmHg.    Neuro/Psych PSYCHIATRIC DISORDERS Depression negative neurological ROS     GI/Hepatic Neg liver ROS, GERD  ,  Endo/Other  diabetes, Type 2Lab Results      Component                Value               Date                      HGBA1C                   5.5                 09/07/2020             Renal/GU ESRF and DialysisRenal diseaseLab Results       Component                Value               Date                      CREATININE               3.50 (H)            09/12/2020           Lab Results      Component                Value               Date                      K                        4.2                 09/12/2020            Last HD sat  Musculoskeletal   Abdominal   Peds  Hematology  (+) Blood dyscrasia, anemia , Lab Results      Component                Value               Date                      WBC                      16.0 (H)            09/07/2020                HGB                      12.9                09/12/2020                HCT                      38.0                09/12/2020                MCV                      99.8                09/07/2020                PLT                      328                 09/07/2020              Anesthesia Other Findings   Reproductive/Obstetrics                           Anesthesia Physical Anesthesia Plan  ASA: III  Anesthesia Plan: General   Post-op Pain Management:    Induction: Intravenous  PONV Risk Score and Plan: 2 and Ondansetron and Dexamethasone  Airway Management Planned: Oral ETT  Additional Equipment: None  Intra-op Plan:   Post-operative Plan:   Informed Consent: I have reviewed the patients History and Physical, chart, labs and discussed the procedure including the risks, benefits and alternatives for the proposed anesthesia with the patient or authorized representative who has indicated his/her understanding and acceptance.     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  During her prior hospital stay at [facility] patient had worsening renal function and lower extremity ischemic change and postoperative A. fib. Patient's worsening renal function  was attributed to atheroembolic disease and has been following up with nephrologist and on June 15, 2020 patient's creatinine was around 4. Was instructed to repeat and the repeat on showed worsening and was advised to go to ER. Patient was subsequently admitted with AKI on CKD which did not improve with conservative measures so subsequently patient underwent TDC on 1/26 and dialysis was started. She was also found to have bilateral  PAD, ischemic changes and dry gangrene on most of the toes and bilateral feet. Vascular surgery was consulted and patient underwent arteriogram on 07/08/2020 and was found to have atherosclerosis of bilateral lower extremities with gangrene  And bilateral bilateral occluded SFAs.  Her gangrene was watched conservatively by vascular surgery.  Per vascular surgery, due to the fact that patient has just been initiated on dialysis and she has multiple comorbidities they recommended continued conservative management with close outpatient followup.  Of note, patient also underwent left first stage brachiocephalic AV fistula by vascular surgery on 07/11/2020.   Per surgery posting, pt will continue ASA and stop Eliquis 48hrs prior.   Patient last seen by her primary cardiologist Dr. Ellyn Hack on 08/05/2020.  Noted that she was actually doing fairly well from a cardiac standpoint.  No known recurrence of paroxysmal atrial fibrillation, however she was continued on amiodarone 100 mg daily given her significant comorbidities and risk factors for recurrence of A. fib.  She was also continued on Eliquis.  No anginal symptoms with rest or exertion however noted that she was not doing very much, working with home health PT but exercise limited by significant bilateral foot pain due to PAD and dry gangrene.  Discussed that she would probably eventually need bilateral femoral to popliteal bypass, however at that time recommended to wait as long as possible to allow her to recover from multiple ongoing  comorbidities.  ESRD on HD Tuesday Thursday Saturday via [facility].  DM2, last A1c 5.5 on 09/07/2020.  Preop labs reviewed, WBC mildly elevated at 16.0, INR mildly elevated at 1.8, labs otherwise consistent with ESRD.  Dr. Scot Dock aware of labs.  Preop urinalysis concerning for possible UTI, this is being treated by vascular surgery with antibiotics.  EKG 08/05/2020: Normal sinus rhythm.  Rate 65.  PORTABLE CHEST 1 VIEW 07/06/20: COMPARISON:  June 26, 2019  FINDINGS: The patient is status post prior median sternotomy. The heart size is enlarged but stable. There is no pneumothorax. No large pleural effusion. Aortic calcifications are noted. There is no acute osseous abnormality. There is a rounded density projecting over the left lower lung zone.  IMPRESSION: 1. No acute cardiopulmonary process. 2. Rounded density projecting over the left lower lung zone is likely a nipple shadow. However, a follow-up two-view chest x-ray in 4-6 weeks is recommended to confirm stability or resolution of this finding.  TTE 08/31/2020: 1. Left ventricular ejection fraction, by estimation, is 55 to 60%. The  left ventricle has normal function. The left ventricle has no regional  wall motion abnormalities. Left ventricular diastolic parameters are  consistent with Grade I diastolic  dysfunction (impaired relaxation).  2. Right ventricular systolic function is mildly reduced. The right  ventricular size is normal. There is normal pulmonary artery systolic  pressure. The estimated right ventricular systolic pressure is 56.3 mmHg.  3. The mitral valve is grossly normal. Trivial mitral valve  regurgitation.  4. The aortic valve is tricuspid. Aortic valve regurgitation is trivial.  Aortic regurgitation PHT measures 836 msec.  5. The inferior vena cava is normal in size with greater than 50%  respiratory variability, suggesting right atrial pressure of 3 mmHg.   Other recent cardiac/vascular  studies from [place], [place]: TEE 05/31/2020: Normal LV function.  No LAA thrombus. Large mobile left atrial mass attached to the left atrial septum (4.8 x 3.1 cm with calcification)-most consistent with atrial myxoma.  EF estimated 55%.  Normal valves. ABIs/ LEA Dopplers 05/31/2020 ->  suggested moderate to severe PAD on the right leg and moderate PAD on the left.  Recommended vascular consultation. Right leg: 0.65 Left leg: 0.69 Right TBI: 0.34 Left TBI: 0.47   [facility] Cardiac Cath 06/01/2020: Biplane Coronary Angiography - 25 mL =.  Significant proximal RCA and PDA disease; Elevated PCWP. Normal CO/CI.  CT Sgx 212/23/2021:  Intra-Op TEE Pre-Bypass: Normal Biventricular function (LVEF > 55%). Large LA mass with calcifications and minimal vascularity throughout, measured at 5.92 x 4cm at largest. LA dilation. Trace MR, no mitral stenosis. No increase in pulmonary vein velocities. Diffuse  atherosclerosis in descending aorta, focal calcification on sinus of valsalva. Mild eccentric AI from LCC/NCC.   Post-Bypass: Upon initial weaning of CPB, laceration noted in RV requiring return to CPB and fixation. S/p weaning with normal biventricular function on no inotropic support. S/p mass excision with no residual mass noted in LA, no ASD or PFO noted.  Pulmonaryveins and mitral valve similar to pre-procedure.   [place]: 07/08/2020 (Dr. Scot Dock): AbAoGram & LEA Runoff: 2 R Renal A, 1 L Renal A. Known Para-renal Aneurysm.  Right: Common Iliac, external iliac and hypogastric arteries patent; CFA/Deep FA patent.  RSFA-occluded at origin, reconstitutes at [place] Popliteal A - 2 V runnoff via  R Peroneal A and R Posterior Tibial A, R ATA CTO Left: Common Iliac, external iliac and hypogastric arteries patent; CFA/Deep FA patent. L SFA - occluded at origin, reconstitutes at [place] Popliteal A - 2 V runnoff via R Peroneal A and R Posterior Tibial A, L  ATA CTO CLINICAL NOTE:This patient has Bilat  SFA TO. Currently her toes are stable. If she developed worsening wounds on her feet her best chance for limb salvage would be staged bilateral femoral to above-knee popliteal artery bypass graft. Currently given that she is just started dialysis with multiple medical comorbidities we will not plan revascularization unless the wounds progress. )       Anesthesia Quick Evaluation

## 2020-09-08 NOTE — Progress Notes (Signed)
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  During her prior hospital stay at [facility] patient had worsening renal function and lower extremity ischemic change and postoperative A. fib. 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 Was instructed to repeat and the repeat on showed worsening and was advised to go to ER. Patient was subsequently admitted with AKI on CKD which did not improve with conservative measures so subsequently patient underwent TDC on 1/26 and dialysis was started. 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 Vascular surgery was consulted and patient underwent arteriogram on 07/08/2020 and was found to have atherosclerosis of bilateral lower extremities with gangrene  And bilateral bilateral occluded SFAs.  Her gangrene was watched conservatively by vascular surgery.  Per vascular surgery, due to the fact that patient has just been initiated on dialysis and she has multiple comorbidities they recommended continued conservative management with close outpatient followup.  Of note, patient also underwent left first stage brachiocephalic AV fistula by vascular surgery on 07/11/2020.   Per surgery posting, pt will continue ASA and stop Eliquis 48hrs prior.   Patient last seen by her primary cardiologist Dr. Ellyn Hack on 08/05/2020.  Noted that she was actually doing fairly well from a cardiac standpoint.  No known recurrence of paroxysmal atrial fibrillation, however she was continued on amiodarone 100 mg daily given her significant comorbidities and risk factors for recurrence of A. fib.  She was also continued on Eliquis.  No anginal symptoms with rest or exertion however noted that she was not doing very much, working with home  health PT but exercise limited by significant bilateral foot pain due to PAD and dry gangrene.  Discussed that she would probably eventually need bilateral femoral to popliteal bypass, however at that time recommended to wait as long as possible to allow her to recover from multiple ongoing comorbidities.  ESRD on HD Tuesday Thursday Saturday via [facility].  DM2, last A1c 5.5 on 09/07/2020.  Preop labs reviewed, WBC mildly elevated at 16.0, INR mildly elevated at 1.8, labs otherwise consistent with ESRD.  Dr. Scot Dock aware of labs.  Preop urinalysis concerning for possible UTI, this is being treated by vascular surgery with antibiotics.  EKG 08/05/2020: Normal sinus rhythm.  Rate 65.  PORTABLE CHEST 1 VIEW 07/06/20: COMPARISON:  June 26, 2019  FINDINGS: The patient is status post prior median sternotomy. The heart size is enlarged but stable. There is no pneumothorax. No large pleural effusion. Aortic calcifications are noted. There is no acute osseous abnormality. There is a rounded density projecting over the left lower lung zone.  IMPRESSION: 1. No acute cardiopulmonary process. 2. Rounded density projecting over the left lower lung zone is likely a nipple shadow. However, a follow-up two-view chest x-ray in 4-6 weeks is recommended to confirm stability or resolution of this finding.  TTE 08/31/2020: 1. Left ventricular ejection fraction, by estimation, is 55 to 60%. The  left ventricle has normal function. The left ventricle has no regional  wall motion abnormalities. Left ventricular diastolic parameters are  consistent with Grade I diastolic  dysfunction (impaired relaxation).  2. Right ventricular systolic function is mildly reduced. The right  ventricular size is normal. There is normal pulmonary artery systolic  pressure. The estimated right ventricular systolic  pressure is 21.3 mmHg.  3. The mitral valve is grossly normal. Trivial mitral valve  regurgitation.  4. The  aortic valve is tricuspid. Aortic valve regurgitation is trivial.  Aortic regurgitation PHT measures 836 msec.  5. The inferior vena cava is normal in size with greater than 50%  respiratory variability, suggesting right atrial pressure of 3 mmHg.   Other recent cardiac/vascular studies from [place], [place]:  TEE 05/31/2020: Normal LV function.  No LAA thrombus. Large mobile left atrial mass attached to the left atrial septum (4.8 x 3.1 cm with calcification)-most consistent with atrial myxoma.  EF estimated 55%.  Normal valves.  ABIs/ LEA Dopplers 05/31/2020 -> suggested moderate to severe PAD on the right leg and moderate PAD on the left.  Recommended vascular consultation. ? Right leg: 0.65 Left leg: 0.69 Right TBI: 0.34 Left TBI: 0.47   [facility]  Cardiac Cath 06/01/2020: Biplane Coronary Angiography - 25 mL =.  Significant proximal RCA and PDA disease; Elevated PCWP. Normal CO/CI.   CT Sgx 212/23/2021:  ? Intra-Op TEE Pre-Bypass: Normal Biventricular function (LVEF > 55%). Large LA mass with calcifications and minimal vascularity throughout, measured at 5.92 x 4cm at largest. LA dilation. Trace MR, no mitral stenosis. No increase in pulmonary vein velocities. Diffuse  atherosclerosis in descending aorta, focal calcification on sinus of valsalva. Mild eccentric AI from LCC/NCC.   Post-Bypass: Upon initial weaning of CPB, laceration noted in RV requiring return to CPB and fixation. S/p weaning with normal biventricular function on no inotropic support. S/p mass excision with no residual mass noted in LA, no ASD or PFO noted.  Pulmonaryveins and mitral valve similar to pre-procedure.   [place]:  07/08/2020 (Dr. Scot Dock): AbAoGram & LEA Runoff: 2 R Renal A, 1 L Renal A. Known Para-renal Aneurysm.  ? Right: Common Iliac, external iliac and hypogastric arteries patent; CFA/Deep FA patent.  RSFA-occluded at origin, reconstitutes at [place] Popliteal A - 2 V runnoff via  R  Peroneal A and R Posterior Tibial A, R ATA CTO ? Left: Common Iliac, external iliac and hypogastric arteries patent; CFA/Deep FA patent. L SFA - occluded at origin, reconstitutes at [place] Popliteal A - 2 V runnoff via R Peroneal A and R Posterior Tibial A, L  ATA CTO CLINICAL NOTE:This patient has Bilat SFA TO. Currently her toes are stable. If she developed worsening wounds on her feet her best chance for limb salvage would be staged bilateral femoral to above-knee popliteal artery bypass graft. Currently given that she is just started dialysis with multiple medical comorbidities we will not plan revascularization unless the wounds progress.

## 2020-09-12 ENCOUNTER — Inpatient Hospital Stay (HOSPITAL_COMMUNITY): Payer: Medicare Other | Admitting: Anesthesiology

## 2020-09-12 ENCOUNTER — Other Ambulatory Visit: Payer: Self-pay

## 2020-09-12 ENCOUNTER — Inpatient Hospital Stay (HOSPITAL_COMMUNITY)
Admission: RE | Admit: 2020-09-12 | Discharge: 2020-09-14 | DRG: 252 | Disposition: A | Discharge status: Home Health | Payer: Medicare Other | Attending: Vascular Surgery | Admitting: Vascular Surgery

## 2020-09-12 ENCOUNTER — Encounter (HOSPITAL_COMMUNITY): Payer: Self-pay | Admitting: Vascular Surgery

## 2020-09-12 ENCOUNTER — Encounter (HOSPITAL_COMMUNITY)
Admission: RE | Disposition: A | Discharge status: Home Health | Payer: Self-pay | Source: Home / Self Care | Attending: Vascular Surgery

## 2020-09-12 DIAGNOSIS — F32A Depression, unspecified: Secondary | ICD-10-CM | POA: Diagnosis present

## 2020-09-12 DIAGNOSIS — Z880 Allergy status to penicillin: Secondary | ICD-10-CM

## 2020-09-12 DIAGNOSIS — N186 End stage renal disease: Secondary | ICD-10-CM | POA: Diagnosis present

## 2020-09-12 DIAGNOSIS — I2510 Atherosclerotic heart disease of native coronary artery without angina pectoris: Secondary | ICD-10-CM | POA: Diagnosis present

## 2020-09-12 DIAGNOSIS — I480 Paroxysmal atrial fibrillation: Secondary | ICD-10-CM | POA: Diagnosis present

## 2020-09-12 DIAGNOSIS — Z992 Dependence on renal dialysis: Secondary | ICD-10-CM

## 2020-09-12 DIAGNOSIS — E1152 Type 2 diabetes mellitus with diabetic peripheral angiopathy with gangrene: Secondary | ICD-10-CM | POA: Diagnosis present

## 2020-09-12 DIAGNOSIS — N2581 Secondary hyperparathyroidism of renal origin: Secondary | ICD-10-CM | POA: Diagnosis present

## 2020-09-12 DIAGNOSIS — I998 Other disorder of circulatory system: Secondary | ICD-10-CM | POA: Diagnosis not present

## 2020-09-12 DIAGNOSIS — E782 Mixed hyperlipidemia: Secondary | ICD-10-CM | POA: Diagnosis present

## 2020-09-12 DIAGNOSIS — I120 Hypertensive chronic kidney disease with stage 5 chronic kidney disease or end stage renal disease: Secondary | ICD-10-CM | POA: Diagnosis present

## 2020-09-12 DIAGNOSIS — I739 Peripheral vascular disease, unspecified: Secondary | ICD-10-CM | POA: Diagnosis present

## 2020-09-12 DIAGNOSIS — D631 Anemia in chronic kidney disease: Secondary | ICD-10-CM | POA: Diagnosis present

## 2020-09-12 DIAGNOSIS — D151 Benign neoplasm of heart: Secondary | ICD-10-CM | POA: Diagnosis present

## 2020-09-12 DIAGNOSIS — Z833 Family history of diabetes mellitus: Secondary | ICD-10-CM

## 2020-09-12 DIAGNOSIS — Z951 Presence of aortocoronary bypass graft: Secondary | ICD-10-CM

## 2020-09-12 DIAGNOSIS — Z7982 Long term (current) use of aspirin: Secondary | ICD-10-CM

## 2020-09-12 DIAGNOSIS — I70263 Atherosclerosis of native arteries of extremities with gangrene, bilateral legs: Secondary | ICD-10-CM | POA: Diagnosis present

## 2020-09-12 DIAGNOSIS — E8889 Other specified metabolic disorders: Secondary | ICD-10-CM | POA: Diagnosis present

## 2020-09-12 DIAGNOSIS — Z7901 Long term (current) use of anticoagulants: Secondary | ICD-10-CM

## 2020-09-12 DIAGNOSIS — I714 Abdominal aortic aneurysm, without rupture: Secondary | ICD-10-CM | POA: Diagnosis present

## 2020-09-12 DIAGNOSIS — Z8249 Family history of ischemic heart disease and other diseases of the circulatory system: Secondary | ICD-10-CM

## 2020-09-12 DIAGNOSIS — K219 Gastro-esophageal reflux disease without esophagitis: Secondary | ICD-10-CM | POA: Diagnosis present

## 2020-09-12 DIAGNOSIS — E1169 Type 2 diabetes mellitus with other specified complication: Secondary | ICD-10-CM | POA: Diagnosis present

## 2020-09-12 DIAGNOSIS — F17210 Nicotine dependence, cigarettes, uncomplicated: Secondary | ICD-10-CM | POA: Diagnosis present

## 2020-09-12 DIAGNOSIS — Z79899 Other long term (current) drug therapy: Secondary | ICD-10-CM | POA: Diagnosis not present

## 2020-09-12 DIAGNOSIS — Z9851 Tubal ligation status: Secondary | ICD-10-CM

## 2020-09-12 DIAGNOSIS — Z7984 Long term (current) use of oral hypoglycemic drugs: Secondary | ICD-10-CM | POA: Diagnosis not present

## 2020-09-12 DIAGNOSIS — E1122 Type 2 diabetes mellitus with diabetic chronic kidney disease: Secondary | ICD-10-CM | POA: Diagnosis present

## 2020-09-12 DIAGNOSIS — I779 Disorder of arteries and arterioles, unspecified: Secondary | ICD-10-CM

## 2020-09-12 DIAGNOSIS — Z83438 Family history of other disorder of lipoprotein metabolism and other lipidemia: Secondary | ICD-10-CM

## 2020-09-12 HISTORY — PX: FEMORAL-POPLITEAL BYPASS GRAFT: SHX937

## 2020-09-12 LAB — CBC
HCT: 33.2 % — ABNORMAL LOW (ref 36.0–46.0)
Hemoglobin: 10.3 g/dL — ABNORMAL LOW (ref 12.0–15.0)
MCH: 31.2 pg (ref 26.0–34.0)
MCHC: 31 g/dL (ref 30.0–36.0)
MCV: 100.6 fL — ABNORMAL HIGH (ref 80.0–100.0)
Platelets: 204 10*3/uL (ref 150–400)
RBC: 3.3 MIL/uL — ABNORMAL LOW (ref 3.87–5.11)
RDW: 17.1 % — ABNORMAL HIGH (ref 11.5–15.5)
WBC: 12.9 10*3/uL — ABNORMAL HIGH (ref 4.0–10.5)
nRBC: 0 % (ref 0.0–0.2)

## 2020-09-12 LAB — POCT I-STAT, CHEM 8
BUN: 25 mg/dL — ABNORMAL HIGH (ref 8–23)
Calcium, Ion: 1.17 mmol/L (ref 1.15–1.40)
Chloride: 100 mmol/L (ref 98–111)
Creatinine, Ser: 3.5 mg/dL — ABNORMAL HIGH (ref 0.44–1.00)
Glucose, Bld: 95 mg/dL (ref 70–99)
HCT: 38 % (ref 36.0–46.0)
Hemoglobin: 12.9 g/dL (ref 12.0–15.0)
Potassium: 4.2 mmol/L (ref 3.5–5.1)
Sodium: 138 mmol/L (ref 135–145)
TCO2: 24 mmol/L (ref 22–32)

## 2020-09-12 LAB — GLUCOSE, CAPILLARY
Glucose-Capillary: 94 mg/dL (ref 70–99)
Glucose-Capillary: 97 mg/dL (ref 70–99)

## 2020-09-12 LAB — CREATININE, SERUM
Creatinine, Ser: 3.37 mg/dL — ABNORMAL HIGH (ref 0.44–1.00)
GFR, Estimated: 14 mL/min — ABNORMAL LOW (ref >60)

## 2020-09-12 SURGERY — BYPASS GRAFT FEMORAL-POPLITEAL ARTERY
Anesthesia: General | Site: Leg Upper | Laterality: Left

## 2020-09-12 MED ORDER — CHLORHEXIDINE GLUCONATE CLOTH 2 % EX PADS: 6.0000 | MEDICATED_PAD | Freq: Once | CUTANEOUS | Status: DC

## 2020-09-12 MED ORDER — ALUM & MAG HYDROXIDE-SIMETH 200-200-20 MG/5ML PO SUSP
15.0000 mL | ORAL | Status: DC | PRN
  Administered 2020-09-13: 30 mL via ORAL
  Filled 2020-09-12: qty 30

## 2020-09-12 MED ORDER — GLIMEPIRIDE 1 MG PO TABS
1.0000 mg | ORAL_TABLET | Freq: Two times a day (BID) | ORAL | Status: DC
  Administered 2020-09-12 – 2020-09-14 (×4): 1 mg via ORAL
  Filled 2020-09-12 (×5): qty 1

## 2020-09-12 MED ORDER — ACETAMINOPHEN 650 MG RE SUPP: 325.0000 mg | RECTAL | Status: DC | PRN

## 2020-09-12 MED ORDER — SODIUM CHLORIDE 0.9 % IV SOLN
INTRAVENOUS | Status: AC
  Filled 2020-09-12: qty 1.2

## 2020-09-12 MED ORDER — FENTANYL CITRATE (PF) 250 MCG/5ML IJ SOLN
INTRAMUSCULAR | Status: AC
  Filled 2020-09-12: qty 5

## 2020-09-12 MED ORDER — PANTOPRAZOLE SODIUM 40 MG PO TBEC
40.0000 mg | DELAYED_RELEASE_TABLET | Freq: Every day | ORAL | Status: DC
  Administered 2020-09-12 – 2020-09-14 (×3): 40 mg via ORAL
  Filled 2020-09-12 (×3): qty 1

## 2020-09-12 MED ORDER — AMLODIPINE BESYLATE 5 MG PO TABS
5.0000 mg | ORAL_TABLET | Freq: Every day | ORAL | Status: DC
  Administered 2020-09-13 – 2020-09-14 (×2): 5 mg via ORAL
  Filled 2020-09-12 (×2): qty 1

## 2020-09-12 MED ORDER — MAGNESIUM SULFATE 2 GM/50ML IV SOLN: 2.0000 g | Freq: Every day | INTRAVENOUS | Status: DC | PRN

## 2020-09-12 MED ORDER — OXYCODONE-ACETAMINOPHEN 5-325 MG PO TABS
1.0000 | ORAL_TABLET | ORAL | Status: DC | PRN
Start: 2020-09-12 — End: 2020-09-14
  Administered 2020-09-12 – 2020-09-13 (×2): 2 via ORAL
  Administered 2020-09-13: 1 via ORAL
  Administered 2020-09-13 – 2020-09-14 (×2): 2 via ORAL
  Filled 2020-09-12 (×5): qty 2

## 2020-09-12 MED ORDER — DOCUSATE SODIUM 100 MG PO CAPS: 100.0000 mg | ORAL_CAPSULE | Freq: Every day | ORAL | Status: DC | PRN

## 2020-09-12 MED ORDER — GUAIFENESIN-DM 100-10 MG/5ML PO SYRP: 15.0000 mL | ORAL_SOLUTION | ORAL | Status: DC | PRN

## 2020-09-12 MED ORDER — VANCOMYCIN HCL IN DEXTROSE 1-5 GM/200ML-% IV SOLN
1000.0000 mg | INTRAVENOUS | Status: AC
  Administered 2020-09-12: 1000 mg via INTRAVENOUS
  Filled 2020-09-12: qty 200

## 2020-09-12 MED ORDER — GLYCOPYRROLATE 0.2 MG/ML IJ SOLN
INTRAMUSCULAR | Status: DC | PRN
  Administered 2020-09-12: .4 mg via INTRAVENOUS

## 2020-09-12 MED ORDER — SODIUM CHLORIDE 0.9 % IV SOLN: INTRAVENOUS | Status: DC

## 2020-09-12 MED ORDER — MIDAZOLAM HCL 5 MG/5ML IJ SOLN
INTRAMUSCULAR | Status: DC | PRN
  Administered 2020-09-12: 2 mg via INTRAVENOUS

## 2020-09-12 MED ORDER — HEMOSTATIC AGENTS (NO CHARGE) OPTIME
TOPICAL | Status: DC | PRN
  Administered 2020-09-12: 1 via TOPICAL

## 2020-09-12 MED ORDER — FAMOTIDINE 20 MG PO TABS: 20.0000 mg | ORAL_TABLET | Freq: Every day | ORAL | Status: DC | PRN

## 2020-09-12 MED ORDER — PROPOFOL 10 MG/ML IV BOLUS
INTRAVENOUS | Status: DC | PRN
  Administered 2020-09-12: 70 mg via INTRAVENOUS
  Administered 2020-09-12: 10 mg via INTRAVENOUS

## 2020-09-12 MED ORDER — DEXAMETHASONE SODIUM PHOSPHATE 10 MG/ML IJ SOLN
INTRAMUSCULAR | Status: DC | PRN
  Administered 2020-09-12: 5 mg via INTRAVENOUS

## 2020-09-12 MED ORDER — POTASSIUM CHLORIDE CRYS ER 20 MEQ PO TBCR: 20.0000 meq | EXTENDED_RELEASE_TABLET | Freq: Every day | ORAL | Status: DC | PRN

## 2020-09-12 MED ORDER — VITAMIN D 25 MCG (1000 UNIT) PO TABS
2000.0000 [IU] | ORAL_TABLET | Freq: Every evening | ORAL | Status: DC
  Administered 2020-09-12 – 2020-09-13 (×2): 2000 [IU] via ORAL
  Filled 2020-09-12 (×2): qty 2

## 2020-09-12 MED ORDER — NEOSTIGMINE METHYLSULFATE 10 MG/10ML IV SOLN
INTRAVENOUS | Status: DC | PRN
  Administered 2020-09-12: 3 mg via INTRAVENOUS

## 2020-09-12 MED ORDER — ACETAMINOPHEN 325 MG PO TABS: 325.0000 mg | ORAL_TABLET | ORAL | Status: DC | PRN

## 2020-09-12 MED ORDER — ASPIRIN EC 81 MG PO TBEC
81.0000 mg | DELAYED_RELEASE_TABLET | Freq: Every day | ORAL | Status: DC
  Administered 2020-09-13 – 2020-09-14 (×2): 81 mg via ORAL
  Filled 2020-09-12 (×2): qty 1

## 2020-09-12 MED ORDER — HEPARIN SODIUM (PORCINE) 1000 UNIT/ML IJ SOLN
INTRAMUSCULAR | Status: DC | PRN
  Administered 2020-09-12: 6000 [IU] via INTRAVENOUS

## 2020-09-12 MED ORDER — MIDAZOLAM HCL 2 MG/2ML IJ SOLN
INTRAMUSCULAR | Status: AC
  Filled 2020-09-12: qty 2

## 2020-09-12 MED ORDER — OXYCODONE HCL 5 MG PO TABS
5.0000 mg | ORAL_TABLET | Freq: Two times a day (BID) | ORAL | Status: DC | PRN
  Administered 2020-09-12: 5 mg via ORAL
  Administered 2020-09-13: 10 mg via ORAL
  Filled 2020-09-12: qty 2
  Filled 2020-09-12: qty 1

## 2020-09-12 MED ORDER — LOPERAMIDE HCL 2 MG PO TABS: 2.0000 mg | ORAL_TABLET | Freq: Four times a day (QID) | ORAL | Status: DC | PRN

## 2020-09-12 MED ORDER — PHENYLEPHRINE HCL-NACL 10-0.9 MG/250ML-% IV SOLN
INTRAVENOUS | Status: DC | PRN
  Administered 2020-09-12: 20 ug/min via INTRAVENOUS

## 2020-09-12 MED ORDER — ONDANSETRON HCL 4 MG/2ML IJ SOLN
INTRAMUSCULAR | Status: DC | PRN
  Administered 2020-09-12: 4 mg via INTRAVENOUS

## 2020-09-12 MED ORDER — SODIUM CHLORIDE 0.9 % IV SOLN
INTRAVENOUS | Status: DC | PRN
  Administered 2020-09-12: 500 mL

## 2020-09-12 MED ORDER — ORAL CARE MOUTH RINSE: 15.0000 mL | Freq: Once | OROMUCOSAL | Status: AC

## 2020-09-12 MED ORDER — ROCURONIUM BROMIDE 10 MG/ML (PF) SYRINGE
PREFILLED_SYRINGE | INTRAVENOUS | Status: DC | PRN
  Administered 2020-09-12: 50 mg via INTRAVENOUS

## 2020-09-12 MED ORDER — HYDRALAZINE HCL 20 MG/ML IJ SOLN: 5.0000 mg | INTRAMUSCULAR | Status: DC | PRN

## 2020-09-12 MED ORDER — LABETALOL HCL 5 MG/ML IV SOLN: 10.0000 mg | INTRAVENOUS | Status: DC | PRN

## 2020-09-12 MED ORDER — 0.9 % SODIUM CHLORIDE (POUR BTL) OPTIME
TOPICAL | Status: DC | PRN
  Administered 2020-09-12: 3000 mL

## 2020-09-12 MED ORDER — GLUCOSE BLOOD VI STRP: 1.0000 | ORAL_STRIP | Status: DC | PRN

## 2020-09-12 MED ORDER — CHLORHEXIDINE GLUCONATE 0.12 % MT SOLN
15.0000 mL | Freq: Once | OROMUCOSAL | Status: AC
  Administered 2020-09-12: 15 mL via OROMUCOSAL
  Filled 2020-09-12: qty 15

## 2020-09-12 MED ORDER — PHENOL 1.4 % MT LIQD: 1.0000 | OROMUCOSAL | Status: DC | PRN

## 2020-09-12 MED ORDER — ONDANSETRON HCL 4 MG/2ML IJ SOLN: 4.0000 mg | Freq: Four times a day (QID) | INTRAMUSCULAR | Status: DC | PRN

## 2020-09-12 MED ORDER — ASCORBIC ACID 500 MG PO TABS
1000.0000 mg | ORAL_TABLET | Freq: Every evening | ORAL | Status: DC
  Administered 2020-09-12 – 2020-09-13 (×2): 1000 mg via ORAL
  Filled 2020-09-12 (×2): qty 2

## 2020-09-12 MED ORDER — SULFAMETHOXAZOLE-TRIMETHOPRIM 400-80 MG PO TABS
1.0000 | ORAL_TABLET | Freq: Two times a day (BID) | ORAL | Status: AC
  Administered 2020-09-12 – 2020-09-13 (×3): 1 via ORAL
  Filled 2020-09-12 (×3): qty 1

## 2020-09-12 MED ORDER — PHENYLEPHRINE 40 MCG/ML (10ML) SYRINGE FOR IV PUSH (FOR BLOOD PRESSURE SUPPORT)
PREFILLED_SYRINGE | INTRAVENOUS | Status: DC | PRN
  Administered 2020-09-12: 80 ug via INTRAVENOUS

## 2020-09-12 MED ORDER — CEFAZOLIN SODIUM-DEXTROSE 2-4 GM/100ML-% IV SOLN
2.0000 g | Freq: Three times a day (TID) | INTRAVENOUS | Status: DC
  Filled 2020-09-12: qty 100

## 2020-09-12 MED ORDER — SODIUM CHLORIDE 0.9 % IV SOLN: 500.0000 mL | Freq: Once | INTRAVENOUS | Status: DC | PRN

## 2020-09-12 MED ORDER — MONTELUKAST SODIUM 10 MG PO TABS: 10.0000 mg | ORAL_TABLET | Freq: Every day | ORAL | Status: DC | PRN

## 2020-09-12 MED ORDER — AMIODARONE HCL 200 MG PO TABS
200.0000 mg | ORAL_TABLET | Freq: Every day | ORAL | Status: DC
  Administered 2020-09-13 – 2020-09-14 (×2): 200 mg via ORAL
  Filled 2020-09-12 (×2): qty 1

## 2020-09-12 MED ORDER — FENTANYL CITRATE (PF) 100 MCG/2ML IJ SOLN
INTRAMUSCULAR | Status: DC | PRN
  Administered 2020-09-12 (×2): 50 ug via INTRAVENOUS

## 2020-09-12 MED ORDER — MORPHINE SULFATE (PF) 2 MG/ML IV SOLN: 2.0000 mg | INTRAVENOUS | Status: DC | PRN

## 2020-09-12 MED ORDER — HEPARIN SODIUM (PORCINE) 5000 UNIT/ML IJ SOLN
5000.0000 [IU] | Freq: Three times a day (TID) | INTRAMUSCULAR | Status: DC
  Administered 2020-09-12 – 2020-09-13 (×3): 5000 [IU] via SUBCUTANEOUS
  Filled 2020-09-12 (×2): qty 1

## 2020-09-12 MED ORDER — LACTATED RINGERS IV SOLN: INTRAVENOUS | Status: DC | PRN

## 2020-09-12 MED ORDER — SUCCINYLCHOLINE CHLORIDE 200 MG/10ML IV SOSY
PREFILLED_SYRINGE | INTRAVENOUS | Status: AC
  Filled 2020-09-12: qty 10

## 2020-09-12 MED ORDER — CEFAZOLIN SODIUM-DEXTROSE 2-4 GM/100ML-% IV SOLN
2.0000 g | Freq: Once | INTRAVENOUS | Status: AC
  Administered 2020-09-12: 2 g via INTRAVENOUS
  Filled 2020-09-12: qty 100

## 2020-09-12 MED ORDER — LIDOCAINE 2% (20 MG/ML) 5 ML SYRINGE
INTRAMUSCULAR | Status: AC
  Filled 2020-09-12: qty 5

## 2020-09-12 MED ORDER — PROTAMINE SULFATE 10 MG/ML IV SOLN
INTRAVENOUS | Status: DC | PRN
  Administered 2020-09-12: 40 mg via INTRAVENOUS

## 2020-09-12 MED ORDER — LIDOCAINE-PRILOCAINE 2.5-2.5 % EX CREA
1.0000 "application " | TOPICAL_CREAM | Freq: Every day | CUTANEOUS | Status: DC | PRN
  Filled 2020-09-12: qty 5

## 2020-09-12 MED ORDER — FLUTICASONE PROPIONATE 50 MCG/ACT NA SUSP: 2.0000 | NASAL | Status: DC | PRN

## 2020-09-12 MED ORDER — FOLIC ACID 800 MCG PO TABS: 800.0000 ug | ORAL_TABLET | Freq: Every day | ORAL | Status: DC

## 2020-09-12 MED ORDER — ATORVASTATIN CALCIUM 10 MG PO TABS
20.0000 mg | ORAL_TABLET | Freq: Every day | ORAL | Status: DC
  Administered 2020-09-13 – 2020-09-14 (×2): 20 mg via ORAL
  Filled 2020-09-12 (×2): qty 2

## 2020-09-12 MED ORDER — ACETAMINOPHEN 500 MG PO TABS: 500.0000 mg | ORAL_TABLET | Freq: Four times a day (QID) | ORAL | Status: DC | PRN

## 2020-09-12 MED ORDER — ROCURONIUM BROMIDE 10 MG/ML (PF) SYRINGE
PREFILLED_SYRINGE | INTRAVENOUS | Status: AC
  Filled 2020-09-12: qty 10

## 2020-09-12 MED ORDER — PHENYLEPHRINE 40 MCG/ML (10ML) SYRINGE FOR IV PUSH (FOR BLOOD PRESSURE SUPPORT)
PREFILLED_SYRINGE | INTRAVENOUS | Status: AC
  Filled 2020-09-12: qty 10

## 2020-09-12 MED ORDER — PAPAVERINE HCL 30 MG/ML IJ SOLN
INTRAMUSCULAR | Status: AC
  Filled 2020-09-12: qty 2

## 2020-09-12 MED ORDER — DOCUSATE SODIUM 100 MG PO CAPS
100.0000 mg | ORAL_CAPSULE | Freq: Every day | ORAL | Status: DC
  Administered 2020-09-13 – 2020-09-14 (×2): 100 mg via ORAL
  Filled 2020-09-12 (×2): qty 1

## 2020-09-12 MED ORDER — LIDOCAINE 2% (20 MG/ML) 5 ML SYRINGE
INTRAMUSCULAR | Status: DC | PRN
  Administered 2020-09-12: 50 mg via INTRAVENOUS

## 2020-09-12 MED ORDER — CARVEDILOL 3.125 MG PO TABS
3.1250 mg | ORAL_TABLET | Freq: Two times a day (BID) | ORAL | Status: DC
  Administered 2020-09-12 – 2020-09-14 (×3): 3.125 mg via ORAL
  Filled 2020-09-12 (×3): qty 1

## 2020-09-12 MED ORDER — METOPROLOL TARTRATE 5 MG/5ML IV SOLN: 2.0000 mg | INTRAVENOUS | Status: DC | PRN

## 2020-09-12 MED ORDER — PROPOFOL 10 MG/ML IV BOLUS
INTRAVENOUS | Status: AC
  Filled 2020-09-12: qty 40

## 2020-09-12 MED ORDER — CHLORHEXIDINE GLUCONATE CLOTH 2 % EX PADS
6.0000 | MEDICATED_PAD | Freq: Every day | CUTANEOUS | Status: DC
  Administered 2020-09-13: 6 via TOPICAL

## 2020-09-12 SURGICAL SUPPLY — 56 items
BANDAGE ESMARK 6X9 LF (GAUZE/BANDAGES/DRESSINGS) ×1 IMPLANT
BNDG ESMARK 6X9 LF (GAUZE/BANDAGES/DRESSINGS) ×2
CANISTER SUCT 3000ML PPV (MISCELLANEOUS) ×2 IMPLANT
CANNULA VESSEL 3MM 2 BLNT TIP (CANNULA) ×4 IMPLANT
CLIP VESOCCLUDE MED 24/CT (CLIP) ×2 IMPLANT
CLIP VESOCCLUDE SM WIDE 24/CT (CLIP) ×2 IMPLANT
COVER PROBE W GEL 5X96 (DRAPES) ×2 IMPLANT
COVER WAND RF STERILE (DRAPES) IMPLANT
CUFF TOURN SGL QUICK 24 (TOURNIQUET CUFF) ×1
CUFF TOURN SGL QUICK 34 (TOURNIQUET CUFF)
CUFF TOURN SGL QUICK 42 (TOURNIQUET CUFF) IMPLANT
CUFF TRNQT CYL 24X4X16.5-23 (TOURNIQUET CUFF) ×1 IMPLANT
CUFF TRNQT CYL 34X4.125X (TOURNIQUET CUFF) IMPLANT
DERMABOND ADVANCED (GAUZE/BANDAGES/DRESSINGS) ×2
DERMABOND ADVANCED .7 DNX12 (GAUZE/BANDAGES/DRESSINGS) ×2 IMPLANT
DRAIN CHANNEL 15F RND FF W/TCR (WOUND CARE) IMPLANT
DRAPE HALF SHEET 40X57 (DRAPES) IMPLANT
DRAPE X-RAY CASS 24X20 (DRAPES) IMPLANT
ELECT REM PT RETURN 9FT ADLT (ELECTROSURGICAL) ×4
ELECTRODE REM PT RTRN 9FT ADLT (ELECTROSURGICAL) ×2 IMPLANT
EVACUATOR SILICONE 100CC (DRAIN) IMPLANT
GLOVE BIO SURGEON STRL SZ7.5 (GLOVE) ×2 IMPLANT
GLOVE SRG 8 PF TXTR STRL LF DI (GLOVE) ×1 IMPLANT
GLOVE SURG UNDER POLY LF SZ8 (GLOVE) ×1
GOWN STRL REUS W/ TWL LRG LVL3 (GOWN DISPOSABLE) ×3 IMPLANT
GOWN STRL REUS W/TWL LRG LVL3 (GOWN DISPOSABLE) ×3
GRAFT PROPATEN THIN WALL 6X80 (Vascular Products) ×2 IMPLANT
HEMOSTAT HEMOBLAST BELLOWS (HEMOSTASIS) ×2 IMPLANT
INSERT FOGARTY SM (MISCELLANEOUS) ×2 IMPLANT
KIT BASIN OR (CUSTOM PROCEDURE TRAY) ×2 IMPLANT
KIT TURNOVER KIT B (KITS) ×2 IMPLANT
MARKER GRAFT CORONARY BYPASS (MISCELLANEOUS) IMPLANT
NS IRRIG 1000ML POUR BTL (IV SOLUTION) ×6 IMPLANT
PACK PERIPHERAL VASCULAR (CUSTOM PROCEDURE TRAY) ×2 IMPLANT
PAD ARMBOARD 7.5X6 YLW CONV (MISCELLANEOUS) ×4 IMPLANT
PENCIL BUTTON HOLSTER BLD 10FT (ELECTRODE) ×2 IMPLANT
SET COLLECT BLD 21X3/4 12 (NEEDLE) IMPLANT
SPONGE SURGIFOAM ABS GEL 100 (HEMOSTASIS) IMPLANT
STOPCOCK 4 WAY LG BORE MALE ST (IV SETS) IMPLANT
SUT ETHIBOND 5 LR DA (SUTURE) IMPLANT
SUT ETHILON 3 0 PS 1 (SUTURE) IMPLANT
SUT MNCRL AB 4-0 PS2 18 (SUTURE) ×4 IMPLANT
SUT PROLENE 5 0 C 1 24 (SUTURE) ×4 IMPLANT
SUT PROLENE 6 0 BV (SUTURE) ×4 IMPLANT
SUT SILK 2 0 SH (SUTURE) ×2 IMPLANT
SUT SILK 3 0 (SUTURE)
SUT SILK 3-0 18XBRD TIE 12 (SUTURE) IMPLANT
SUT VIC AB 2-0 CTB1 (SUTURE) ×2 IMPLANT
SUT VIC AB 3-0 SH 27 (SUTURE) ×3
SUT VIC AB 3-0 SH 27X BRD (SUTURE) ×3 IMPLANT
SUT VICRYL 4-0 PS2 18IN ABS (SUTURE) ×4 IMPLANT
TOWEL GREEN STERILE (TOWEL DISPOSABLE) ×2 IMPLANT
TRAY FOLEY MTR SLVR 16FR STAT (SET/KITS/TRAYS/PACK) IMPLANT
TUBING EXTENTION W/L.L. (IV SETS) IMPLANT
UNDERPAD 30X36 HEAVY ABSORB (UNDERPADS AND DIAPERS) ×2 IMPLANT
WATER STERILE IRR 1000ML POUR (IV SOLUTION) ×2 IMPLANT

## 2020-09-12 NOTE — Op Note (Signed)
NAME: Kathryn Hancock    MRN: [id number] DOB: 1951/04/15    DATE OF OPERATION: 09/12/2020  PREOP DIAGNOSIS:    Bilateral critical limb ischemia with bilateral infrainguinal arterial occlusive disease  POSTOP DIAGNOSIS:    Same  PROCEDURE:    Left femoral to above-knee popliteal artery bypass with PTFE  SURGEON: Judeth Cornfield. Scot Dock, MD  ASSIST: Ruta Hinds MD  ANESTHESIA: General  EBL: Minimal  INDICATIONS:    Kathryn Hancock is a 70 y.o. 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  She developed gangrene on the toes of both feet.  She underwent an arteriogram which showed bilateral superficial femoral artery occlusions.  It was felt that her best chance for limb salvage would be staged femoropopliteal bypass graft.  The toes on the left were worse so was started on the left side.  She had two-vessel runoff on the left via the peroneal and posterior tibial arteries.  FINDINGS:   Brisk peroneal and posterior tibial signal at the completion of the procedure.  TECHNIQUE:   The patient was taken to the operating room and received a general anesthetic.  The left lower extremity was prepped and draped in usual sterile fashion.  The patient had vein harvested from both legs previously.  I did look with the SonoSite myself and did not see any usable vein in the left leg.  A longitudinal incision was made in the left groin.  Through this incision the common femoral, deep femoral, and superficial femoral arteries were dissected free.  The artery had plaque posterior medially.  I had to dissected well under the inguinal ligament to find an area to clamp.  There was a soft spot anteriorly.  A separate longitudinal incision was made above the knee.  Through this incision the the dissection was carried down through the fascia and the saphenous nerve was preserved.  The above-knee popliteal artery was exposed at this level and the artery was soft at this  level.  A tunnel was created between the 2 incisions the patient was heparinized.  Attention was first turned to the proximal anastomosis.  The external iliac artery, superficial femoral, and deep femoral arteries were controlled.  Multiple side branches were controlled.  A longitudinal arteriotomy was made anteriorly in the proximal common femoral artery.  The graft was spatulated and sewn end-to-side to the common femoral artery using continuous 6-0 Prolene suture.  Prior to completing this anastomosis the arteries were backbled and flushed appropriately after the graft was clamped.  There was excellent inflow.  The graft then pulled the appropriate length for anastomosis to the above-knee popliteal artery.  A tourniquet was placed on the thigh.  The leg was exsanguinated with an Esmarch bandage and the tourniquet inflated to 250 mmHg.  Under tourniquet control a longitudinal arteriotomy was made in the above-knee popliteal artery.  The PTFE graft was cut to the appropriate length spatulated and sewn end-to-side to the above-knee popliteal artery using continuous 6-0 Prolene suture.  The tourniquet was released and there was good backbleeding the graft was then flushed and the anastomosis completed.  Flow was reestablished to the left leg.  There was a brisk posterior tibial and peroneal signal with a Doppler.  Hemostasis was obtained in the wounds.  The heparin was partially reversed with protamine.  The groin incision was closed with a deep layer of 2-0 Vicryl, the subcutaneous layer with 3-0 Vicryl and the skin closed with 4-0 Monocryl.  The  above the knee incision was closed with 2 deep layers of 3-0 Vicryl and the skin closed with a 4-0 Monocryl.  Sterile dressing was applied.  The patient tolerated the procedure well was transferred to the recovery room in stable condition.  All needle and sponge counts were correct.  Given the complexity of the case a first assistant was necessary in order to expedient  the procedure and safely perform the technical aspects of the operation.  Deitra Mayo, MD, FACS [facility] DICTATION:   09/12/2020

## 2020-09-12 NOTE — Progress Notes (Addendum)
PHARMACY NOTE:  ANTIMICROBIAL RENAL DOSAGE ADJUSTMENT  Current antimicrobial regimen includes a mismatch between antimicrobial dosage and estimated renal function.  As per policy approved by the Pharmacy & Therapeutics and Medical Executive Committees, the antimicrobial dosage will be adjusted accordingly.  Current antimicrobial dosage:  Cefazolin 2g q8h x2  Indication: surg ppx  Renal Function:  Estimated Creatinine Clearance: 12.9 mL/min (A) (by C-G formula based on SCr of 3.5 mg/dL (H)). [x]      On intermittent HD, scheduled: []      On CRRT    Antimicrobial dosage has been changed to:  Cefazolin 2g x1 (none charted preop)  Additional comments:   Thank you for allowing pharmacy to be a part of this patient's care.  Einar Grad, [facility] 09/12/2020 11:58 AM

## 2020-09-12 NOTE — Transfer of Care (Signed)
Immediate Anesthesia Transfer of Care Note  Patient: Kathryn Hancock  Procedure(s) Performed: LEFT FEMORAL-ABOVE KNEE POPLITEAL ARTERY BYPASS GRAFT (Left Leg Upper)  Patient Location: PACU  Anesthesia Type:General  Level of Consciousness: drowsy  Airway & Oxygen Therapy: Patient Spontanous Breathing and Patient connected to face mask oxygen  Post-op Assessment: Report given to RN and Post -op Vital signs reviewed and stable  Post vital signs: Reviewed and stable  Last Vitals:  Vitals Value Taken Time  BP 106/53 09/12/20 1016  Temp    Pulse 58 09/12/20 1018  Resp 17 09/12/20 1018  SpO2 100 % 09/12/20 1018  Vitals shown include unvalidated device data.  Last Pain:  Vitals:   09/12/20 0610  PainSc: 4       Patients Stated Pain Goal: 3 (57/90/38 3338)  Complications: No complications documented.

## 2020-09-12 NOTE — Progress Notes (Signed)
  Progress Note    09/12/2020 3:25 PM Day of Surgery  Subjective:  No complaints. Eating her lunch   Vitals:   09/12/20 1116 09/12/20 1147  BP: (!) 114/52 118/63  Pulse: (!) 58 60  Resp: 20 18  Temp: 97.7 F (36.5 C) 97.6 F (36.4 C)  SpO2: 100% 100%   Physical Exam: Cardiac: regular Lungs:non labored Incisions: left femoral and ak pop incisions c/d/i. No swelling or hematoma Extremities: well perfused and warm. Left foot with brisk DP/ PT and peroneal signals Neurologic: alert and oriented  CBC    Component Value Date/Time   WBC 12.9 (H) 09/12/2020 1322   RBC 3.30 (L) 09/12/2020 1322   HGB 10.3 (L) 09/12/2020 1322   HGB 9.8 (L) 05/26/2020 1158   HCT 33.2 (L) 09/12/2020 1322   HCT 29.6 (L) 05/26/2020 1158   PLT 204 09/12/2020 1322   PLT 286 05/26/2020 1158   MCV 100.6 (H) 09/12/2020 1322   MCV 100 (H) 05/26/2020 1158   MCH 31.2 09/12/2020 1322   MCHC 31.0 09/12/2020 1322   RDW 17.1 (H) 09/12/2020 1322   RDW 14.1 05/26/2020 1158    BMET    Component Value Date/Time   NA 138 09/12/2020 0613   NA 139 05/26/2020 1158   K 4.2 09/12/2020 0613   CL 100 09/12/2020 0613   CO2 27 09/07/2020 1042   GLUCOSE 95 09/12/2020 0613   BUN 25 (H) 09/12/2020 0613   BUN 75 (H) 05/26/2020 1158   CREATININE 3.37 (H) 09/12/2020 1322   CALCIUM 8.8 (L) 09/07/2020 1042   GFRNONAA 14 (L) 09/12/2020 1322   GFRAA 16 (L) 05/26/2020 1158    INR    Component Value Date/Time   INR 1.8 (H) 09/07/2020 1042     Intake/Output Summary (Last 24 hours) at 09/12/2020 1525 Last data filed at 09/12/2020 1100 Gross per 24 hour  Intake 900 ml  Output 50 ml  Net 850 ml     Assessment/Plan:  70 y.o. female is s/p left femoral to above knee popliteal artery bypass with PTFE Day of Surgery  Doing well post op. Left lower extremity well perfused and warm. Left groin and AK pop incisions clean, dry and intact. No swelling or hematoma. Worked with PT. She has tolerated ambulating and sitting up  in chair. Tolerating diet. Pain well controlled. VSS. Hemodynamically intact. HD planned for tomorrow per Nephrology. Plan will be to restart Eliquis tomorrow   Karoline Caldwell, [facility] [phone] 09/12/2020 3:25 PM

## 2020-09-12 NOTE — Progress Notes (Signed)
Pt arrived to 4e from PACU. Pt oriented to room and staff. Vitals obtained and stable. CHG bath done.

## 2020-09-12 NOTE — Anesthesia Procedure Notes (Signed)
Procedure Name: Intubation Date/Time: 09/12/2020 7:38 AM Performed by: Hoy Morn, CRNA Pre-anesthesia Checklist: Patient identified, Emergency Drugs available, Suction available and Patient being monitored Patient Re-evaluated:Patient Re-evaluated prior to induction Oxygen Delivery Method: Circle system utilized Preoxygenation: Pre-oxygenation with 100% oxygen Induction Type: IV induction Ventilation: Mask ventilation without difficulty Laryngoscope Size: Miller and 2 Grade View: Grade I Tube type: Oral Tube size: 7.0 mm Number of attempts: 1 Airway Equipment and Method: Stylet and Oral airway Placement Confirmation: ETT inserted through vocal cords under direct vision,  positive ETCO2 and breath sounds checked- equal and bilateral Secured at: 22 cm Tube secured with: Tape Dental Injury: Teeth and Oropharynx as per pre-operative assessment

## 2020-09-12 NOTE — Progress Notes (Signed)
Patient noted to have bruising right side along with scabbed area to buttock also noted to have scabbed area to left buttock.  Patient is a dialysis patient toes to both feet are black cool to touch

## 2020-09-12 NOTE — Plan of Care (Signed)

## 2020-09-12 NOTE — Consult Note (Signed)
[name] Renal Consultation Note    Indication for Consultation:  Management of ESRD/hemodialysis, anemia, hypertension/volume, and secondary hyperparathyroidism. PCP:  HPI: Kathryn Hancock is a 70 y.o. 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  Seen in room, she is still sleep s/p surgery this morning. Underwent L femoral - popliteal artery bypass with PTFE graft by Dr. Scot Dock. No leg pain at the moment. No CP, dyspnea, N/V/D, fever, chills.   Dialyzes on TTS schedule at [facility]. Last HD was Sat 4/2. She will be due for dialysis tomorrow. Uses R IJ TDC as her access, s/p LUE 1st stage BVT.  Past Medical History:  Diagnosis Date  . AAA (abdominal aortic aneurysm) ([place]) 06/2019   Multiple small pseudoaneurysmal projections of the dominant aorta.  Distal abdominal aortic aneurysm 4.5 x 4.7 cm.  Greatest AP dimension of the infrarenal aorta is 4.9.  No evidence of thoracic aortic aneurysm or dissection.  Brief segment of proximal IMA occlusion.  . Anemia   . Coronary artery calcification seen on CAT scan 06/2019  . Coronary artery disease involving native coronary artery of native heart with unstable angina pectoris ([place]) 06/01/2020   Cardiac cath at [facility] 06/01/2020-preop for atrial myxoma resection -> severe proximal RCA and PDA -> had single-vessel CABG with SVG-PDA along with myxoma resection.  . Depression   . DM (diabetes mellitus), type 2 with renal complications ([place]) 09/07/5186  . 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 Dyspnea   . ESRD (end stage renal disease) on dialysis ([place]) 06/2020   Progression of CKDIIIb to ESRD initially related to thromboembolic event from left atrial myxoma; complicated  by perioperative hypotension-; now 1 on HD TU/TH/SAT  . GERD (gastroesophageal reflux disease)   . Heavy smoker (more than 20 cigarettes per day)    ~ 2 ppd; since age 80 (100 pk yr) = has cut down to one half PPD.>  . Hyperlipidemia    Mixed  . Hyperlipidemia associated with type 2 diabetes mellitus ([place]) 08/05/2020  . Hypertension   . 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 = Status post resection December 23,2021 (done at [facility] because of no bed availability at [facility]  . Microscopic hematuria   . PAF (paroxysmal atrial fibrillation) ([place]) 07/01/2020   Initially noted postoperatively-left atrial myxoma resection and CABG x1.  Now on amiodarone and apixaban..  . Peripheral vascular disease ([place])   . Plantar wart of right foot   . PONV (postoperative nausea and vomiting)    Past Surgical History:  Procedure Laterality Date  . ABDOMINAL AORTOGRAM W/LOWER EXTREMITY N/A 07/08/2020   Procedure: ABDOMINAL AORTOGRAM W/LOWER EXTREMITY;  Surgeon: Angelia Mould, MD;  Location: [place] [facility];  Service: Cardiovascular;  Laterality: N/A;  . AV FISTULA PLACEMENT Left 07/11/2020   Procedure: LEFT FIRST STAGE BRACHIOBASILIC UPPER EXTREMITY ARTERIOVENOUS (AV) FISTULA CREATION;  Surgeon: [name], MD;  Location: [place];  Service: Vascular;  Laterality: Left;  . Cardiac MRI  01/22/2020   Normal LV size and function.  Moderate focal basal septal hypertrophy.  No S.A.M.  LVEF 61%.  RVEF 66%.  Large mobile mass in the left atrium  attached to the interatrial septum-does not appear to thrombus characteristics consistent with left atrial myxoma.  . Chest CTA  06/2019   Multiple small pseudoaneurysmal projections of the dominant aorta.  Distal abdominal aortic aneurysm 4.5 x 4.7 cm.  Greatest AP dimension of the infrarenal aorta is 4.9.  No evidence of thoracic aortic aneurysm or dissection.  Brief segment  of proximal IMA occlusion.  Large geographic filling defect of the left atrium with associated dense radiopaque material.  Aortic atherosclerosis and emphysema  . COLONOSCOPY W/ POLYPECTOMY    . CORONARY ARTERY BYPASS GRAFT     05/2020 at [facility]  . EYE SURGERY Bilateral    Cataract surgery  . IR FLUORO GUIDE CV LINE RIGHT  07/06/2020  . IR US GUIDE VASC ACCESS RIGHT  07/06/2020  . TRANSTHORACIC ECHOCARDIOGRAM  07/2019    EF 60 to 65%.  GR 1 DD.  Elevated LVEDP.  Large size ill-defined echodensity in the left atrium suspicious for thrombus versus tumor.  Moderately dilated LA.  . TUBAL LIGATION     Family History  Problem Relation Age of Onset  . Diabetes Mother   . Diabetes Father   . Heart attack Father 84  . CAD Father   . Hyperlipidemia Father   . Hypertension Father    Social History:  reports that she has been smoking cigarettes. She has a 25.00 pack-year smoking history. She has never used smokeless tobacco. She reports previous alcohol use. She reports that she does not use drugs.  ROS: As per HPI otherwise negative.  Physical Exam: Vitals:   09/12/20 1046 09/12/20 1101 09/12/20 1116 09/12/20 1147  BP: (!) 115/53 (!) 114/52 (!) 114/52 118/63  Pulse: (!) 56 (!) 57 (!) 58 60  Resp: 13 12 20 18   Temp:   97.7 F (36.5 C) 97.6 F (36.4 C)  TempSrc:    Oral  SpO2: 100% 100% 100% 100%  Weight:      Height:         General: Well developed, well nourished, in no acute distress. Room air. Head: Normocephalic, atraumatic, sclera non-icteric, mucus membranes are moist. Neck: Supple without lymphadenopathy/masses. JVD not elevated. Lungs: Clear bilaterally to auscultation without wheezes, rales, or rhonchi.  Heart: RRR with normal S1, S2. No murmurs, rubs, or gallops appreciated. Abdomen: Soft, non-tender, non-distended with normoactive bowel sounds.  Musculoskeletal:  Strength and tone appear normal for age. Lower extremities: No LE edema. Dry gangrene to toes on B feet; L thigh  incision bandaged, unable to fully examine given position in bed Neuro: Alert and oriented X 3. Moves all extremities spontaneously. Psych:  Responds to questions appropriately with a normal affect. Dialysis Access: L IJ TDC without tenderness, LUE AVF (1st BVT) + bruit - maturing  Allergies  Allergen Reactions  . Penicillins Other (See Comments)    Childhood reaction.   Prior to Admission medications   Medication Sig Start Date End Date Taking? Authorizing Provider  acetaminophen (TYLENOL) 500 MG tablet Take 500-1,000 mg by mouth every 6 (six) hours as needed for moderate pain.   Yes [provider]  amiodarone (PACERONE) 200 MG tablet Take 200 mg by mouth daily. 06/10/20 06/10/21 Yes [provider]  amLODipine (NORVASC) 5 MG tablet Take 5 mg by mouth daily. 06/10/20 06/10/21 Yes [provider]  Ascorbic Acid (VITAMIN C) 1000 MG tablet Take 1,000 mg by mouth every evening.   Yes [provider]  aspirin EC 81 MG tablet Take 81 mg by mouth daily.  Swallow whole.   Yes [provider]  atorvastatin (LIPITOR) 20 MG tablet Take 20 mg by mouth daily.   Yes [provider]  carvedilol (COREG) 6.25 MG tablet TAKE 1 TABLET (6.25 MG TOTAL) BY MOUTH TWO TIMES DAILY WITH A MEAL. 07/15/20 07/15/21 Yes Pahwani, Einar Grad, MD  docusate sodium (COLACE) 100 MG capsule Take 100 mg by mouth daily as needed for mild constipation.   Yes [provider]  folic acid (FOLVITE) 440 MCG tablet Take 800 mcg by mouth daily.   Yes [provider]  loperamide (IMODIUM A-D) 2 MG tablet Take 2 mg by mouth 4 (four) times daily as needed for diarrhea or loose stools.   Yes [provider]  oxyCODONE (OXY IR/ROXICODONE) 5 MG immediate release tablet Take 5-10 mg by mouth 2 (two) times daily as needed for moderate pain. 06/09/20  Yes [provider]  sulfamethoxazole-trimethoprim (BACTRIM) 400-80 MG tablet Take 1 tablet by mouth 2 (two) times  daily. 09/07/20  Yes Serafina Mitchell, MD  Vitamin D3 (VITAMIN D) 25 MCG tablet Take 2,000 Units by mouth every evening.   Yes [provider]  apixaban (ELIQUIS) 5 MG TABS tablet Take 1 tablet (5 mg total) by mouth 2 (two) times daily. Patient not taking: No sig reported 12/10/19   Leonie Man, MD  famotidine (PEPCID) 20 MG tablet Take 20 mg by mouth daily as needed for headache.    [provider]  fluticasone (FLONASE) 50 MCG/ACT nasal spray Place 2 sprays into both nostrils as needed for allergies or rhinitis.    [provider]  glimepiride (AMARYL) 1 MG tablet Take 1 mg by mouth 2 (two) times daily. Hold if blood sugar is less than 120    [provider]  glucose blood test strip 1 each by Other route as needed. Use as instructed    [provider]  lidocaine-prilocaine (EMLA) cream Apply 1 application topically daily as needed (dialysis). 08/12/20   [provider]  montelukast (SINGULAIR) 10 MG tablet Take 10 mg by mouth daily as needed (allergies).    [provider]   Current Facility-Administered Medications  Medication Dose Route Frequency Provider Last Rate Last Admin  . 0.9 %  sodium chloride infusion  500 mL Intravenous Once PRN Angelia Mould, MD      . acetaminophen (TYLENOL) tablet 325-650 mg  325-650 mg Oral Q4H PRN Angelia Mould, MD       Or  . acetaminophen (TYLENOL) suppository 325-650 mg  325-650 mg Rectal Q4H PRN Angelia Mould, MD      . alum & mag hydroxide-simeth (MAALOX/MYLANTA) 200-200-20 MG/5ML suspension 15-30 mL  15-30 mL Oral Q2H PRN Angelia Mould, MD      . Derrill Memo ON 09/13/2020] amiodarone (PACERONE) tablet 200 mg  200 mg Oral Daily Angelia Mould, MD      . Derrill Memo ON 09/13/2020] amLODipine (NORVASC) tablet 5 mg  5 mg Oral Daily Angelia Mould, MD      . ascorbic acid (VITAMIN C) tablet 1,000 mg  1,000 mg Oral QPM Angelia Mould, MD      . Derrill Memo  ON 09/13/2020] aspirin EC tablet 81 mg  81 mg Oral Daily Angelia Mould, MD      . Derrill Memo ON 09/13/2020] atorvastatin (LIPITOR) tablet 20 mg  20 mg Oral Daily Angelia Mould, MD      . carvedilol (COREG) tablet 3.125 mg  3.125 mg Oral BID  WC Angelia Mould, MD      . ceFAZolin (ANCEF) IVPB 2g/100 mL premix  2 g Intravenous Once Einar Grad, [facility]      . cholecalciferol (VITAMIN D3) tablet 2,000 Units  2,000 Units Oral QPM Angelia Mould, MD      . docusate sodium (COLACE) capsule 100 mg  100 mg Oral Daily PRN Angelia Mould, MD      . Derrill Memo ON 09/13/2020] docusate sodium (COLACE) capsule 100 mg  100 mg Oral Daily Angelia Mould, MD      . glimepiride (AMARYL) tablet 1 mg  1 mg Oral BID Angelia Mould, MD      . guaiFENesin-dextromethorphan [facility] DM) 100-10 MG/5ML syrup 15 mL  15 mL Oral Q4H PRN Angelia Mould, MD      . heparin injection 5,000 Units  5,000 Units Subcutaneous Q8H Angelia Mould, MD      . hydrALAZINE (APRESOLINE) injection 5 mg  5 mg Intravenous Q20 Min PRN Angelia Mould, MD      . labetalol (NORMODYNE) injection 10 mg  10 mg Intravenous Q10 min PRN Angelia Mould, MD      . lidocaine-prilocaine (EMLA) cream 1 application  1 application Topical Daily PRN Angelia Mould, MD      . magnesium sulfate IVPB 2 g 50 mL  2 g Intravenous Daily PRN Angelia Mould, MD      . metoprolol tartrate (LOPRESSOR) injection 2-5 mg  2-5 mg Intravenous Q2H PRN Angelia Mould, MD      . morphine 2 MG/ML injection 2-5 mg  2-5 mg Intravenous Q1H PRN Angelia Mould, MD      . ondansetron [facility]) injection 4 mg  4 mg Intravenous Q6H PRN Angelia Mould, MD      . oxyCODONE (Oxy IR/ROXICODONE) immediate release tablet 5-10 mg  5-10 mg Oral BID PRN Angelia Mould, MD      . oxyCODONE-acetaminophen (PERCOCET/ROXICET) 5-325 MG per tablet 1-2 tablet  1-2 tablet Oral Q4H  PRN Angelia Mould, MD      . pantoprazole (PROTONIX) EC tablet 40 mg  40 mg Oral Daily Angelia Mould, MD      . phenol (CHLORASEPTIC) mouth spray 1 spray  1 spray Mouth/Throat PRN Angelia Mould, MD      . potassium chloride SA (KLOR-CON) CR tablet 20-40 mEq  20-40 mEq Oral Daily PRN Angelia Mould, MD      . sulfamethoxazole-trimethoprim (BACTRIM) 400-80 MG per tablet 1 tablet  1 tablet Oral BID Angelia Mould, MD       Labs: Basic Metabolic Panel: Recent Labs  Lab 09/07/20 1042 09/12/20 0613  NA 135 138  K 3.9 4.2  CL 97* 100  CO2 27  --   GLUCOSE 131* 95  BUN 12 25*  CREATININE 2.86* 3.50*  CALCIUM 8.8*  --    Liver Function Tests: Recent Labs  Lab 09/07/20 1042  AST 16  ALT 10  ALKPHOS 192*  BILITOT 0.4  PROT 7.1  ALBUMIN 2.7*   CBC: Recent Labs  Lab 09/07/20 1042 09/12/20 0613  WBC 16.0*  --   HGB 12.5 12.9  HCT 40.5 38.0  MCV 99.8  --   PLT 328  --    Dialysis Orders:  TTS at [facility] -> last dialyzed on 4/2 3:45hr, 400/500, EDW 58kg, 3K/2.25Ca, TDC, no heparin - No ESA or VDRA  Assessment/Plan: 1.  B toe gangrene  s/p L fem-pop bypass 4/4 as part of staged procedure for PAD: Per VVS. 2.  ESRD: Continue HD per usual TTS schedule - next HD tomorrow (4/5). No heparin. 3.  Hypertension/volume: BP controlled, no edema on exam. At EDW today - minimal UF. 4.  Anemia of ESRD: Hgb 12.9 this morning - watch for post-op drop. No ESA for now. 5.  Metabolic bone disease: Ca ok, no VDRA or binders for now. 6.  T2DM: On glimepiride 7.  CAD + A-fib: on amiodarone, BB, statin, ASA  Veneta Penton, PA-C 09/12/2020, 1:43 PM  [employer]

## 2020-09-12 NOTE — Interval H&P Note (Signed)
History and Physical Interval Note:  09/12/2020 7:09 AM  Kathryn Hancock  has presented today for surgery, with the diagnosis of PVD WITH GANGRENE.  The various methods of treatment have been discussed with the patient and family. After consideration of risks, benefits and other options for treatment, the patient has consented to  Procedure(s): LEFT [place] (Left) as a surgical intervention.  The patient's history has been reviewed, patient examined, no change in status, stable for surgery.  I have reviewed the patient's chart and labs.  Questions were answered to the patient's satisfaction.     Deitra Mayo

## 2020-09-12 NOTE — Evaluation (Signed)
Physical Therapy Evaluation Patient Details Name: Kathryn Hancock MRN: [id number] DOB: 1951-01-21 Today's Date: 09/12/2020   History of Present Illness  Pt is a 70 y/o female who is s/p Left femoral to above-knee popliteal artery bypass. 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  Clinical Impression  Pt admitted secondary to problem above with deficits below. Pt requiring min guard A to stand and transfer to chair this session. Limited secondary to increased pain. Pt reports daughter lives with her and can assist at home as needed. Anticipate pt will progress well once pain controlled. Will continue to follow acutely.     Follow Up Recommendations Home health PT    Equipment Recommendations  Rolling walker with 5" wheels    Recommendations for Other Services       Precautions / Restrictions Precautions Precautions: Fall Restrictions Weight Bearing Restrictions: No      Mobility  Bed Mobility Overal bed mobility: Needs Assistance Bed Mobility: Supine to Sit     Supine to sit: Min assist     General bed mobility comments: Min A for LLE assist. Increased time required secondary to pain.    Transfers Overall transfer level: Needs assistance Equipment used: Rolling walker (2 wheeled) Transfers: Sit to/from [facility] Sit to Stand: Min guard Stand pivot transfers: Min guard       General transfer comment: Min guard for safety to stand and transfer to chair. Pt with increased pain in LLE, so further mobility deferred.  Ambulation/Gait                Stairs            Wheelchair Mobility    Modified Rankin (Stroke Patients Only)       Balance Overall balance assessment: Needs assistance Sitting-balance support: No upper extremity supported;Feet supported Sitting balance-Leahy Scale: Good     Standing balance support: Bilateral upper extremity supported;During functional  activity Standing balance-Leahy Scale: Poor Standing balance comment: Reliant on BUE support                             Pertinent Vitals/Pain Pain Assessment: Faces Faces Pain Scale: Hurts even more Pain Location: LLE Pain Descriptors / Indicators: Grimacing;Guarding;Operative site guarding Pain Intervention(s): Limited activity within patient's tolerance;Monitored during session;Repositioned    Home Living Family/patient expects to be discharged to:: Private residence Living Arrangements: Children Available Help at Discharge: Family;Available 24 hours/day Type of Home: House Home Access: Stairs to enter   [employer] of Steps: 1 (threshold) Home Layout: One level Home Equipment: Tub bench;Hand held shower head;Bedside commode;Walker - 4 wheels      Prior Function Level of Independence: Needs assistance   Gait / Transfers Assistance Needed: Using rollator for ambulation  ADL's / Homemaking Assistance Needed: Pt reports sponge bathing and daughter assists when needed. Daughter also assists with ADLs        Hand Dominance        Extremity/Trunk Assessment   Upper Extremity Assessment Upper Extremity Assessment: Defer to OT evaluation    Lower Extremity Assessment Lower Extremity Assessment: LLE deficits/detail;RLE deficits/detail RLE Deficits / Details: Discoloration noted in toes. Pt reports this is baseline. LLE Deficits / Details: Deficits consistent with post op pain and weakness. Discoloration noted in multiple toes. reports this is baseline.    Cervical / Trunk Assessment Cervical / Trunk Assessment: Kyphotic  Communication  Communication: No difficulties  Cognition Arousal/Alertness: Awake/alert Behavior During Therapy: WFL for tasks assessed/performed Overall Cognitive Status: Within Functional Limits for tasks assessed                                        General Comments General comments (skin integrity,  edema, etc.): Pt's daughter present during session.    Exercises     Assessment/Plan    PT Assessment Patient needs continued PT services  PT Problem List Decreased strength;Decreased balance;Decreased activity tolerance;Decreased mobility;Pain       PT Treatment Interventions DME instruction;Gait training;Stair training;Therapeutic activities;Functional mobility training;Therapeutic exercise;Balance training;Patient/family education    PT Goals (Current goals can be found in the Care Plan section)  Acute Rehab PT Goals Patient Stated Goal: to go home PT Goal Formulation: With patient Time For Goal Achievement: 09/26/20 Potential to Achieve Goals: Good    Frequency Min 3X/week   Barriers to discharge        Co-evaluation               AM-PAC PT "6 Clicks" Mobility  Outcome Measure Help needed turning from your back to your side while in a flat bed without using bedrails?: A Little Help needed moving from lying on your back to sitting on the side of a flat bed without using bedrails?: A Little Help needed moving to and from a bed to a chair (including a wheelchair)?: A Little Help needed standing up from a chair using your arms (e.g., wheelchair or bedside chair)?: A Little Help needed to walk in hospital room?: A Little Help needed climbing 3-5 steps with a railing? : A Lot 6 Click Score: 17    End of Session Equipment Utilized During Treatment: Gait belt Activity Tolerance: Patient limited by pain Patient left: in chair;with call bell/phone within reach;with chair alarm set Nurse Communication: Mobility status PT Visit Diagnosis: Other abnormalities of gait and mobility (R26.89);Pain Pain - Right/Left: Left Pain - part of body: Leg;Ankle and joints of foot    Time: 1335-1400 PT Time Calculation (min) (ACUTE ONLY): 25 min   Charges:   PT Evaluation $PT Eval Moderate Complexity: 1 Mod PT Treatments $Therapeutic Activity: 8-22 mins        Lou Miner, DPT  Acute Rehabilitation Services  Pager: [phone] Office: [phone]   Rudean Hitt 09/12/2020, 3:00 PM

## 2020-09-13 ENCOUNTER — Encounter (HOSPITAL_COMMUNITY): Payer: Self-pay | Admitting: Vascular Surgery

## 2020-09-13 LAB — CBC
HCT: 28.2 % — ABNORMAL LOW (ref 36.0–46.0)
Hemoglobin: 8.9 g/dL — ABNORMAL LOW (ref 12.0–15.0)
MCH: 31.6 pg (ref 26.0–34.0)
MCHC: 31.6 g/dL (ref 30.0–36.0)
MCV: 100 fL (ref 80.0–100.0)
Platelets: 186 10*3/uL (ref 150–400)
RBC: 2.82 MIL/uL — ABNORMAL LOW (ref 3.87–5.11)
RDW: 17.1 % — ABNORMAL HIGH (ref 11.5–15.5)
WBC: 12.2 10*3/uL — ABNORMAL HIGH (ref 4.0–10.5)
nRBC: 0 % (ref 0.0–0.2)

## 2020-09-13 LAB — LIPID PANEL
Cholesterol: 70 mg/dL (ref 0–200)
HDL: 35 mg/dL — ABNORMAL LOW (ref >40)
LDL Cholesterol: 22 mg/dL (ref 0–99)
Total CHOL/HDL Ratio: 2 RATIO
Triglycerides: 67 mg/dL (ref <150)
VLDL: 13 mg/dL (ref 0–40)

## 2020-09-13 LAB — BASIC METABOLIC PANEL
Anion gap: 11 (ref 5–15)
BUN: 34 mg/dL — ABNORMAL HIGH (ref 8–23)
CO2: 21 mmol/L — ABNORMAL LOW (ref 22–32)
Calcium: 8.1 mg/dL — ABNORMAL LOW (ref 8.9–10.3)
Chloride: 101 mmol/L (ref 98–111)
Creatinine, Ser: 4.02 mg/dL — ABNORMAL HIGH (ref 0.44–1.00)
GFR, Estimated: 12 mL/min — ABNORMAL LOW (ref >60)
Glucose, Bld: 174 mg/dL — ABNORMAL HIGH (ref 70–99)
Potassium: 4.7 mmol/L (ref 3.5–5.1)
Sodium: 133 mmol/L — ABNORMAL LOW (ref 135–145)

## 2020-09-13 LAB — GLUCOSE, CAPILLARY: Glucose-Capillary: 128 mg/dL — ABNORMAL HIGH (ref 70–99)

## 2020-09-13 MED ORDER — ALTEPLASE 2 MG IJ SOLR: 2.0000 mg | Freq: Once | INTRAMUSCULAR | Status: DC | PRN

## 2020-09-13 MED ORDER — HEPARIN SODIUM (PORCINE) 1000 UNIT/ML IJ SOLN
INTRAMUSCULAR | Status: AC
  Filled 2020-09-13: qty 4

## 2020-09-13 MED ORDER — APIXABAN 5 MG PO TABS
5.0000 mg | ORAL_TABLET | Freq: Two times a day (BID) | ORAL | Status: DC
  Administered 2020-09-13 – 2020-09-14 (×3): 5 mg via ORAL
  Filled 2020-09-13 (×3): qty 1

## 2020-09-13 MED ORDER — DARBEPOETIN ALFA 60 MCG/0.3ML IJ SOSY
PREFILLED_SYRINGE | INTRAMUSCULAR | Status: AC
  Filled 2020-09-13: qty 0.3

## 2020-09-13 MED ORDER — SODIUM CHLORIDE 0.9 % IV SOLN: 100.0000 mL | INTRAVENOUS | Status: DC | PRN

## 2020-09-13 MED ORDER — DARBEPOETIN ALFA 60 MCG/0.3ML IJ SOSY
60.0000 ug | PREFILLED_SYRINGE | Freq: Once | INTRAMUSCULAR | Status: AC
  Administered 2020-09-13: 60 ug via INTRAVENOUS

## 2020-09-13 NOTE — Progress Notes (Signed)
Mobility Specialist: Progress Note   09/13/20 1527  Mobility  Activity Ambulated in room  Level of Assistance Contact guard assist, steadying assist  Assistive Device Front wheel walker  Distance Ambulated (ft) 56 ft (28'x2)  Mobility Response Tolerated fair  Mobility performed by Mobility specialist  $Mobility charge 1 Mobility   Pre-Mobility: 69 HR, 100% SpO2 Post-Mobility: 78 HR, 133/67 BP, 100% SpO2  Pt c/o 6/10 pain in both feet pre-mobility. Pt ambulated to the door and back x2 with a seated break in between. Pt c/o 7/10 pain post-mobility in her LLE and back, RN notified. Pt back to bed after walk with call bell at her side.   [facility] Ivett Luebbe Mobility Specialist Mobility Specialist Phone: [phone]

## 2020-09-13 NOTE — Progress Notes (Addendum)
   VASCULAR SURGERY ASSESSMENT & PLAN:   POD 1 LEFT FEM ABOVE-KNEE POP BYPASS (PTFE): Her left foot is warm and hyperemic.  Her incisions look fine.  Mobilize today after dialysis.  Anticipate discharge tomorrow.  We will plan staged right femoropopliteal bypass once she has recovered from this.  VASCULAR QUALITY INITIATIVE: She is on aspirin and is on a statin.  DVT PROPHYLAXIS: I have restarted her Eliquis and discontinued her subcu heparin.   SUBJECTIVE:   No complaints.  She is currently on hemodialysis.  PHYSICAL EXAM:   Vitals:   09/12/20 2010 09/12/20 2343 09/13/20 0430 09/13/20 0650  BP: 114/74 (!) 115/50 (!) 122/55 (!) 132/57  Pulse: 68 63 61   Resp: 13 16 13 15   Temp: 99.1 F (37.3 C) 98.3 F (36.8 C) 98.4 F (36.9 C) 98.2 F (36.8 C)  TempSrc: Oral Oral Oral Oral  SpO2: 99% 99% 100% 98%  Weight:    61.9 kg  Height:       Her incisions look fine.   The left foot is hyperemic. The toes are stable.   LABS:   Lab Results  Component Value Date   WBC 12.2 (H) 09/13/2020   HGB 8.9 (L) 09/13/2020   HCT 28.2 (L) 09/13/2020   MCV 100.0 09/13/2020   PLT 186 09/13/2020   Lab Results  Component Value Date   CREATININE 4.02 (H) 09/13/2020   Lab Results  Component Value Date   INR 1.8 (H) 09/07/2020   CBG (last 3)  Recent Labs    09/12/20 0557 09/12/20 1017  GLUCAP 94 97    PROBLEM LIST:    Active Problems:   PVD (peripheral vascular disease) (HCC)   CURRENT MEDS:   . amiodarone  200 mg Oral Daily  . amLODipine  5 mg Oral Daily  . apixaban  5 mg Oral BID  . vitamin C  1,000 mg Oral QPM  . aspirin EC  81 mg Oral Daily  . atorvastatin  20 mg Oral Daily  . carvedilol  3.125 mg Oral BID WC  . Chlorhexidine Gluconate Cloth  6 each Topical Q0600  . cholecalciferol  2,000 Units Oral QPM  . docusate sodium  100 mg Oral Daily  . glimepiride  1 mg Oral BID  . pantoprazole  40 mg Oral Daily  . sulfamethoxazole-trimethoprim  1 tablet Oral BID     Deitra Mayo Office: [phone] 09/13/2020

## 2020-09-13 NOTE — Progress Notes (Signed)
Physical Therapy Treatment Patient Details Name: Kathryn Hancock MRN: [id number] DOB: 05-Oct-1950 Today's Date: 09/13/2020    History of Present Illness Pt is a 70 y/o female who is s/p Left femoral to above-knee popliteal artery bypass. 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    PT Comments    Pt received in supine, agreeable to therapy session with encouragement, with good participation and fair tolerance for stair, gait and transfer training. Pt remain reliant on RW for support due to moderate LLE pain with mobility, VSS throughout on room air and no loss of balance with household distance mobility tasks with min guard to minA for completion of tasks. Pt continues to benefit from PT services to progress toward functional mobility goals. Anticipate pt safe to DC home with HHPT once medically cleared.   Follow Up Recommendations  Home health PT     Equipment Recommendations  Rolling walker with 5" wheels    Recommendations for Other Services       Precautions / Restrictions Precautions Precautions: Fall Restrictions Weight Bearing Restrictions: No    Mobility  Bed Mobility Overal bed mobility: Needs Assistance Bed Mobility: Supine to Sit;Sit to Supine     Supine to sit: HOB elevated;Min assist Sit to supine: Min guard   General bed mobility comments: Min A for trunk elevation, did not need LE assist back into bed    Transfers Overall transfer level: Needs assistance Equipment used: Rolling walker (2 wheeled) Transfers: Sit to/from Stand Sit to Stand: Min guard         General transfer comment: EOB<>RW  Ambulation/Gait Ambulation/Gait assistance: Min guard Gait Distance (Feet): 25 Feet Assistive device: Rolling walker (2 wheeled) Gait Pattern/deviations: Step-to pattern;Decreased stride length Gait velocity: decreased   General Gait Details: cues for sequencing/use of BUE to offload painful leg, pt reports  moderate pain/fatigue afterward defers hallway ambulation   Stairs Stairs: Yes Stairs assistance: Min guard Stair Management: With walker;Step to pattern;Forwards Number of Stairs: 3 General stair comments: cues for sequencing with good carryover; no LOB, using RW per home setup to ascend threshold step into home   Wheelchair Mobility    Modified Rankin (Stroke Patients Only)       Balance Overall balance assessment: Needs assistance Sitting-balance support: No upper extremity supported;Feet supported Sitting balance-Leahy Scale: Good     Standing balance support: Bilateral upper extremity supported;During functional activity Standing balance-Leahy Scale: Poor Standing balance comment: Reliant on BUE support                            Cognition Arousal/Alertness: Awake/alert Behavior During Therapy: WFL for tasks assessed/performed;Flat affect Overall Cognitive Status: Within Functional Limits for tasks assessed                                 General Comments: participatory, depressed affect (discussed sadness after son overdose 1 year ago 10-11-22, spouse died 09/23/2005, mother died 24-Sep-2002 and spouse health issues also in 09/24/02).      Exercises Other Exercises Other Exercises: supine BLE AROM: hip abduction, ankle pumps, heel slides x5-10 reps ea    General Comments        Pertinent Vitals/Pain Pain Assessment: Faces Faces Pain Scale: Hurts little more Pain Location: LLE Pain Descriptors / Indicators: Grimacing;Guarding;Operative site guarding Pain Intervention(s): Monitored during session;Premedicated before session;Repositioned;Limited activity  within patient's tolerance    Home Living                      Prior Function            PT Goals (current goals can now be found in the care plan section) Acute Rehab PT Goals Patient Stated Goal: to go home PT Goal Formulation: With patient Time For Goal Achievement: 09/26/20 Potential  to Achieve Goals: Good Progress towards PT goals: Progressing toward goals    Frequency    Min 3X/week      PT Plan Current plan remains appropriate    Co-evaluation              AM-PAC PT "6 Clicks" Mobility   Outcome Measure  Help needed turning from your back to your side while in a flat bed without using bedrails?: A Little Help needed moving from lying on your back to sitting on the side of a flat bed without using bedrails?: A Little Help needed moving to and from a bed to a chair (including a wheelchair)?: A Little Help needed standing up from a chair using your arms (e.g., wheelchair or bedside chair)?: A Little Help needed to walk in hospital room?: A Little Help needed climbing 3-5 steps with a railing? : A Little 6 Click Score: 18    End of Session Equipment Utilized During Treatment: Gait belt Activity Tolerance: Patient limited by pain Patient left: in bed;with call bell/phone within reach;with bed alarm set Nurse Communication: Mobility status PT Visit Diagnosis: Other abnormalities of gait and mobility (R26.89);Pain Pain - Right/Left: Left Pain - part of body: Leg;Ankle and joints of foot     Time: 0973-5329 PT Time Calculation (min) (ACUTE ONLY): 17 min  Charges:  $Gait Training: 8-22 mins                     Adryan Shin P., PTA Acute Rehabilitation Services Pager: [phone] Office: [place] 09/13/2020, 5:18 PM

## 2020-09-13 NOTE — Anesthesia Postprocedure Evaluation (Signed)
Anesthesia Post Note  Patient: Kathryn Hancock  Procedure(s) Performed: LEFT FEMORAL-ABOVE KNEE POPLITEAL ARTERY BYPASS GRAFT (Left Leg Upper)     Patient location during evaluation: PACU Anesthesia Type: General Level of consciousness: awake and alert Pain management: pain level controlled Vital Signs Assessment: post-procedure vital signs reviewed and stable Respiratory status: spontaneous breathing, nonlabored ventilation, respiratory function stable and patient connected to nasal cannula oxygen Cardiovascular status: blood pressure returned to baseline and stable Postop Assessment: no apparent nausea or vomiting Anesthetic complications: no   No complications documented.  Last Vitals:  Vitals:   09/13/20 1606 09/13/20 1954  BP: 133/67 (!) 97/53  Pulse: 65 65  Resp: 17 14  Temp: 36.7 C 36.6 C  SpO2: 100% 97%    Last Pain:  Vitals:   09/13/20 1954  TempSrc: Oral  PainSc: 0-No pain                 [name]

## 2020-09-13 NOTE — Progress Notes (Signed)
[facility] Progress Note   Subjective:  Seen on HD - 1.5L UFG and tolerating, will ^ goal to 2L No CP or dyspnea. Some incisional pain and back aching from laying in bed. Tells me plan is to get up/walk today and then possibly home tomorrow.  Objective Vitals:   09/13/20 0650 09/13/20 0700 09/13/20 0730 09/13/20 0800  BP: (!) 132/57 (!) 113/56 (!) 117/55 (!) 116/54  Pulse:      Resp: 15 12 15 14   Temp: 98.2 F (36.8 C)     TempSrc: Oral     SpO2: 98%     Weight: 61.9 kg     Height:       Physical Exam General: Well appearing woman, NAD. Room air. Heart: RRR; no murmur Lungs: CTA anteriorly Abdomen: soft, non-tender Extremities: No LE edema; clean/dry incision to mid L thigh, toes not examined today (gangrene changes) Dialysis Access: L IJ TDC in use, LUE AVF (maturing)  Additional Objective Labs: Basic Metabolic Panel: Recent Labs  Lab 09/07/20 1042 09/12/20 0613 09/12/20 1322 09/13/20 0134  NA 135 138  --  133*  K 3.9 4.2  --  4.7  CL 97* 100  --  101  CO2 27  --   --  21*  GLUCOSE 131* 95  --  174*  BUN 12 25*  --  34*  CREATININE 2.86* 3.50* 3.37* 4.02*  CALCIUM 8.8*  --   --  8.1*   Liver Function Tests: Recent Labs  Lab 09/07/20 1042  AST 16  ALT 10  ALKPHOS 192*  BILITOT 0.4  PROT 7.1  ALBUMIN 2.7*   CBC: Recent Labs  Lab 09/07/20 1042 09/12/20 0613 09/12/20 1322 09/13/20 0134  WBC 16.0*  --  12.9* 12.2*  HGB 12.5 12.9 10.3* 8.9*  HCT 40.5 38.0 33.2* 28.2*  MCV 99.8  --  100.6* 100.0  PLT 328  --  204 186   Medications: . sodium chloride    . sodium chloride    . sodium chloride    . magnesium sulfate bolus IVPB     . amiodarone  200 mg Oral Daily  . amLODipine  5 mg Oral Daily  . apixaban  5 mg Oral BID  . vitamin C  1,000 mg Oral QPM  . aspirin EC  81 mg Oral Daily  . atorvastatin  20 mg Oral Daily  . carvedilol  3.125 mg Oral BID WC  . Chlorhexidine Gluconate Cloth  6 each Topical Q0600  . cholecalciferol   2,000 Units Oral QPM  . docusate sodium  100 mg Oral Daily  . glimepiride  1 mg Oral BID  . heparin sodium (porcine)      . pantoprazole  40 mg Oral Daily  . sulfamethoxazole-trimethoprim  1 tablet Oral BID    Dialysis Orders: TTS at [facility] -> last dialyzed on 4/2 3:45hr, 400/500, EDW 58kg, 3K/2.25Ca, TDC, no heparin - No ESA or VDRA  Assessment/Plan: 1.  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 2.  ESRD: Continue HD per usual TTS schedule - HD now, 2L UFG. 3.  Hypertension/volume: BP controlled, no edema on exam. UF as tolerated. 4.  Anemia of ESRD: Hgb 12.9 -> 8.9 post-op, will give dose Aranesp 71mcg today. 5.  Metabolic bone disease: Ca ok, no VDRA or binders for now. 6.  T2DM: On glimepiride 7.  CAD + A-fib: on amiodarone, BB, statin, ASA   Kathryn Penton,  PA-C 09/13/2020, 8:11 AM  [employer]

## 2020-09-13 NOTE — Evaluation (Signed)
Occupational Therapy Evaluation Patient Details Name: Kathryn Hancock MRN: [id number] DOB: 1950/10/08 Today's Date: 09/13/2020    History of Present Illness Pt is a 70 y/o female who is s/p Left femoral to above-knee popliteal artery bypass. 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   Clinical Impression   Pt presents with decline in function and safety with ADLs and ADL mobility with impaired strength, balance and endurance (pt fatigues easily with min - mod ADL activity). PTA pt lived at home with her daughter and grand children and was mostly Ind with ADLs/selfcare (spknge bathes or daughter assists he into and out of tub shower). Pt currently requires mod - min A with LB selfcare, toileting and min guard A for mobility using RW. Pt would benefit from acute OT services to address impairments to maximize level of function and safety    Follow Up Recommendations  Home health OT    Equipment Recommendations  Other (comment) (reacher, RW)    Recommendations for Other Services       Precautions / Restrictions Precautions Precautions: Fall Restrictions Weight Bearing Restrictions: No      Mobility Bed Mobility Overal bed mobility: Needs Assistance Bed Mobility: Supine to Sit;Sit to Supine     Supine to sit: Min assist Sit to supine: Min assist   General bed mobility comments: Min A for trunk elevation and with LEs back onto bed    Transfers Overall transfer level: Needs assistance Equipment used: Rolling walker (2 wheeled) Transfers: Sit to/from [employer] Sit to Stand: Min guard Stand pivot transfers: Min guard            Balance Overall balance assessment: Needs assistance Sitting-balance support: No upper extremity supported;Feet supported Sitting balance-Leahy Scale: Good     Standing balance support: Bilateral upper extremity supported;During functional activity Standing balance-Leahy  Scale: Poor                             ADL either performed or assessed with clinical judgement   ADL Overall ADL's : Needs assistance/impaired Eating/Feeding: Independent;Sitting   Grooming: Wash/dry hands;Wash/dry face;Min guard;Standing   Upper Body Bathing: Set up;Supervision/ safety;Sitting   Lower Body Bathing: Moderate assistance   Upper Body Dressing : Set up;Supervision/safety;Sitting   Lower Body Dressing: Moderate assistance   Toilet Transfer: Min guard;Ambulation;RW;Comfort height toilet;Grab bars;Cueing for safety   Toileting- Clothing Manipulation and Hygiene: Minimal assistance;Sit to/from stand       Functional mobility during ADLs: Min guard;Rolling walker;Cueing for safety General ADL Comments: pt fatigues easily     Vision Baseline Vision/History: Wears glasses Wears Glasses: Reading only Patient Visual Report: No change from baseline       Perception     Praxis      Pertinent Vitals/Pain Pain Assessment: 0-10 Pain Score: 5  Pain Descriptors / Indicators: Grimacing;Guarding;Operative site guarding Pain Intervention(s): Monitored during session;Repositioned;Premedicated before session     Hand Dominance Right   Extremity/Trunk Assessment Upper Extremity Assessment Upper Extremity Assessment: Generalized weakness   Lower Extremity Assessment Lower Extremity Assessment: Defer to PT evaluation   Cervical / Trunk Assessment Cervical / Trunk Assessment: Kyphotic   Communication Communication Communication: No difficulties   Cognition Arousal/Alertness: Awake/alert Behavior During Therapy: WFL for tasks assessed/performed Overall Cognitive Status: Within Functional Limits for tasks assessed  General Comments       Exercises     Shoulder Instructions      Home Living Family/patient expects to be discharged to:: Private residence Living Arrangements: Children Available  Help at Discharge: Family;Available 24 hours/day Type of Home: House Home Access: Stairs to enter [employer] of Steps: 1   Home Layout: One level         Biochemist, clinical: Standard     Home Equipment: Tub bench;Hand held shower head;Bedside commode;Walker - 4 wheels   Additional Comments: Daughter lost job at beginning of pandemic. Provides 24/7 assist      Prior Functioning/Environment Level of Independence: Needs assistance    ADL's / Homemaking Assistance Needed: Pt reports sponge bathing and daughter assists when needed            OT Problem List: Decreased activity tolerance;Decreased knowledge of use of DME or AE;Impaired balance (sitting and/or standing);Decreased strength      OT Treatment/Interventions: Self-care/ADL training;Patient/family education;Therapeutic activities;Energy conservation;DME and/or AE instruction    OT Goals(Current goals can be found in the care plan section) Acute Rehab OT Goals Patient Stated Goal: to go home OT Goal Formulation: With patient Time For Goal Achievement: 09/27/20 Potential to Achieve Goals: Good ADL Goals Pt Will Perform Grooming: with supervision;with set-up;standing;with caregiver independent in assisting Pt Will Perform Upper Body Bathing: with set-up;with modified independence;with caregiver independent in assisting Pt Will Perform Lower Body Bathing: with min assist;with min guard assist;with adaptive equipment;with caregiver independent in assisting Pt Will Perform Upper Body Dressing: with set-up;with modified independence;with caregiver independent in assisting Pt Will Perform Lower Body Dressing: with min assist;with min guard assist;with adaptive equipment;with caregiver independent in assisting Pt Will Transfer to Toilet: with supervision;with modified independence;ambulating Pt Will Perform Toileting - Clothing Manipulation and hygiene: with min guard assist;with supervision;sit to/from stand  OT  Frequency: Min 2X/week   Barriers to D/C:            Co-evaluation              AM-PAC OT "6 Clicks" Daily Activity     Outcome Measure Help from another person eating meals?: None Help from another person taking care of personal grooming?: A Little Help from another person toileting, which includes using toliet, bedpan, or urinal?: A Little Help from another person bathing (including washing, rinsing, drying)?: A Lot Help from another person to put on and taking off regular upper body clothing?: A Little Help from another person to put on and taking off regular lower body clothing?: A Lot 6 Click Score: 17   End of Session Equipment Utilized During Treatment: Gait belt;Rolling walker  Activity Tolerance: Patient limited by fatigue Patient left: in bed;with call bell/phone within reach  OT Visit Diagnosis: Unsteadiness on feet (R26.81);Muscle weakness (generalized) (M62.81)                Time: 2458-0998 OT Time Calculation (min): 27 min Charges:  OT General Charges $OT Visit: 1 Visit OT Evaluation $OT Eval Moderate Complexity: 1 Mod OT Treatments $Self Care/Home Management : 8-22 mins    Britt Bottom 09/13/2020, 1:22 PM

## 2020-09-13 NOTE — Progress Notes (Signed)
Pt arrived back to 4e01 from dialysis. Vitals obtained and are stable. PRN pain and gas medications given, per pt request.

## 2020-09-14 NOTE — Discharge Summary (Signed)
Discharge Summary     Kathryn Hancock 12/15/1950 70 y.o. female  [id number]  Admission Date: 09/12/2020  Discharge Date: 09/14/2020 Physician: Dr. Scot Dock Admission Diagnosis: PVD (peripheral vascular disease) ([place]) [I73.9]  HPI:   This is a 70 y.o. female Bilateral critical limb ischemia with bilateral infrainguinal arterial occlusive disease  Hospital Course:  The patient was admitted to the hospital and taken to the operating room on 09/12/2020 and underwent: Left femoral to above-knee popliteal artery bypass with PTFE    Findings: Brisk peroneal and posterior tibial signal at the completion of the procedure.  The pt tolerated the procedure well and was transported to the PACU in good condition.   By POD 1, her pain was controlled and she had brisk left pedal pulse signals. The ischemic changes of her toes were dry and stable. Her vital signs were stable and she was afebrile. She was voiding spontaneously and tolerating her diet. Her DOAC was restarted. Her hemoglobin was stable. Her nephrologist was consulted and she underwent inpatient hemodialysis.  On POD 2, her incisions were healing without bleeding, hematoma or signs of infection. Her LE were well perfused. Her pain was controlled. PT evaluation done and rec: HHPT.  The remainder of the hospital course consisted of increasing mobilization and increasing intake of solids without difficulty.  CBC    Component Value Date/Time   WBC 12.2 (H) 09/13/2020 0134   RBC 2.82 (L) 09/13/2020 0134   HGB 8.9 (L) 09/13/2020 0134   HGB 9.8 (L) 05/26/2020 1158   HCT 28.2 (L) 09/13/2020 0134   HCT 29.6 (L) 05/26/2020 1158   PLT 186 09/13/2020 0134   PLT 286 05/26/2020 1158   MCV 100.0 09/13/2020 0134   MCV 100 (H) 05/26/2020 1158   MCH 31.6 09/13/2020 0134   MCHC 31.6 09/13/2020 0134   RDW 17.1 (H) 09/13/2020 0134   RDW 14.1 05/26/2020 1158    BMET    Component Value Date/Time   NA 133 (L) 09/13/2020 0134   NA 139 05/26/2020  1158   K 4.7 09/13/2020 0134   CL 101 09/13/2020 0134   CO2 21 (L) 09/13/2020 0134   GLUCOSE 174 (H) 09/13/2020 0134   BUN 34 (H) 09/13/2020 0134   BUN 75 (H) 05/26/2020 1158   CREATININE 4.02 (H) 09/13/2020 0134   CALCIUM 8.1 (L) 09/13/2020 0134   GFRNONAA 12 (L) 09/13/2020 0134   GFRAA 16 (L) 05/26/2020 1158     Discharge Instructions    Discharge patient   Complete by: As directed    Discharge disposition: 06-Home-Health Care Svc   Discharge patient date: 09/14/2020   Face-to-face encounter (required for Medicare/Medicaid patients)   Complete by: As directed    I Barbie Banner certify that this patient is under my care and that I, or a nurse practitioner or physician's assistant working with me, had a face-to-face encounter that meets the physician face-to-face encounter requirements with this patient on 09/14/2020. The encounter with the patient was in whole, or in part for the following medical condition(s) which is the primary reason for home health care (List medical condition): PAD   The encounter with the patient was in whole, or in part, for the following medical condition, which is the primary reason for home health care: PAD   I certify that, based on my findings, the following services are medically necessary home health services: Physical therapy   Reason for Medically Necessary Home Health Services: Therapy- Home Adaptation to Facilitate Safety   My  clinical findings support the need for the above services: Pain interferes with ambulation/mobility   Further, I certify that my clinical findings support that this patient is homebound due to: Pain interferes with ambulation/mobility   Home Health   Complete by: As directed    To provide the following care/treatments: PT      Discharge Diagnosis:  PVD (peripheral vascular disease) ([place]) [I73.9]  Secondary Diagnosis: Patient Active Problem List   Diagnosis Date Noted  . PVD (peripheral vascular disease) ([place]) 09/12/2020   . Hyperlipidemia associated with type 2 diabetes mellitus ([place]) 08/05/2020  . Hypokalemia 07/27/2020  . Unspecified abnormal findings in urine 07/26/2020  . Unspecified severe protein-calorie malnutrition ([place]) 07/21/2020  . Encounter for immunization 07/19/2020  . 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[place]) 07/16/2020  . Coagulation defect, unspecified ([place]) 07/16/2020  . Allergy, unspecified, initial encounter 07/15/2020  . Anaphylactic shock, unspecified, initial encounter 07/15/2020  . 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  . Pain, unspecified 07/15/2020  . Pruritus, unspecified 07/15/2020  . Secondary hyperparathyroidism of renal origin ([place]) 07/15/2020  . Shortness of breath 07/15/2020  . PAOD (peripheral arterial occlusive disease) ([place]) 07/08/2020  . ESRD needing dialysis ([place]) 07/01/2020  . PAF (paroxysmal atrial fibrillation) ([place]) 07/01/2020  . Anemia of chronic disease 07/01/2020  . Essential hypertension 07/01/2020  . DM (diabetes mellitus), type 2 with renal complications ([place]) 00/93/8182  . Cellulitis of toe 06/05/2020  . S/P CABG x 1 06/02/2020  . Coronary artery disease involving native coronary artery of native heart with unstable angina pectoris ([place]) 06/01/2020  . CKD (chronic kidney disease) stage 3, GFR 30-59 ml/min (HCC) 06/01/2020  . HLD (hyperlipidemia) 06/01/2020  . 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 Tobacco abuse 12/10/2019  . AAA (abdominal aortic aneurysm) without rupture ([place]) 12/10/2019   Past Medical History:  Diagnosis Date  . AAA (abdominal aortic aneurysm) ([place]) 06/2019   Multiple small pseudoaneurysmal projections of the dominant aorta.  Distal abdominal aortic aneurysm 4.5 x 4.7 cm.  Greatest AP dimension of the infrarenal aorta is 4.9.  No evidence of thoracic aortic aneurysm or dissection.  Brief segment of proximal IMA occlusion.  . Anemia   . Coronary artery calcification seen on CAT scan 06/2019  .  Coronary artery disease involving native coronary artery of native heart with unstable angina pectoris ([place]) 06/01/2020   Cardiac cath [facility] 06/01/2020-preop for atrial myxoma resection -> severe proximal RCA and PDA -> had single-vessel CABG with SVG-PDA along with myxoma resection.  . Depression   . DM (diabetes mellitus), type 2 with renal complications ([place]) 9/93/7169  . 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 Dyspnea   . ESRD (end stage renal disease) on dialysis ([place]) 06/2020   Progression of CKDIIIb to ESRD initially related to thromboembolic event from left atrial myxoma; complicated by perioperative hypotension-; now 1 on HD TU/TH/SAT  . GERD (gastroesophageal reflux disease)   . Heavy smoker (more than 20 cigarettes per day)    ~ 2 ppd; since age 34 (100 pk yr) = has cut down to one half PPD.>  . Hyperlipidemia    Mixed  . Hyperlipidemia associated with type 2 diabetes mellitus ([place]) 08/05/2020  . Hypertension   . 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 = Status post resection December 23,2021 (done at [facility] because of no bed availability at [facility]  . Microscopic hematuria   .  PAF (paroxysmal atrial fibrillation) ([place]) 07/01/2020   Initially noted postoperatively-left atrial myxoma resection and CABG x1.  Now on amiodarone and apixaban..  . Peripheral vascular disease ([place])   . Plantar wart of right foot   . PONV (postoperative nausea and vomiting)      Allergies as of 09/14/2020      Reactions   Penicillins Other (See Comments)   Childhood reaction.      Medication List    STOP taking these medications   sulfamethoxazole-trimethoprim 400-80 MG tablet Commonly known as: BACTRIM     TAKE these medications   acetaminophen 500 MG tablet Commonly known as: TYLENOL Take 500-1,000 mg by mouth every 6 (six) hours as needed for moderate pain.   amiodarone 200 MG  tablet Commonly known as: PACERONE Take 200 mg by mouth daily.   amLODipine 5 MG tablet Commonly known as: NORVASC Take 5 mg by mouth daily.   apixaban 5 MG Tabs tablet Commonly known as: Eliquis Take 1 tablet (5 mg total) by mouth 2 (two) times daily.   aspirin EC 81 MG tablet Take 81 mg by mouth daily. Swallow whole.   atorvastatin 20 MG tablet Commonly known as: LIPITOR Take 20 mg by mouth daily.   carvedilol 6.25 MG tablet Commonly known as: COREG TAKE 1 TABLET (6.25 MG TOTAL) BY MOUTH TWO TIMES DAILY WITH A MEAL.   docusate sodium 100 MG capsule Commonly known as: COLACE Take 100 mg by mouth daily as needed for mild constipation.   famotidine 20 MG tablet Commonly known as: PEPCID Take 20 mg by mouth daily as needed for headache.   fluticasone 50 MCG/ACT nasal spray Commonly known as: FLONASE Place 2 sprays into both nostrils as needed for allergies or rhinitis.   folic acid 628 MCG tablet Commonly known as: FOLVITE Take 800 mcg by mouth daily.   glimepiride 1 MG tablet Commonly known as: AMARYL Take 1 mg by mouth 2 (two) times daily. Hold if blood sugar is less than 120   glucose blood test strip 1 each by Other route as needed. Use as instructed   lidocaine-prilocaine cream Commonly known as: EMLA Apply 1 application topically daily as needed (dialysis).   loperamide 2 MG tablet Commonly known as: IMODIUM A-D Take 2 mg by mouth 4 (four) times daily as needed for diarrhea or loose stools.   montelukast 10 MG tablet Commonly known as: SINGULAIR Take 10 mg by mouth daily as needed (allergies).   oxyCODONE 5 MG immediate release tablet Commonly known as: Oxy IR/ROXICODONE Take 5-10 mg by mouth 2 (two) times daily as needed for moderate pain.   vitamin C 1000 MG tablet Take 1,000 mg by mouth every evening.   Vitamin D3 25 MCG tablet Commonly known as: Vitamin D Take 2,000 Units by mouth every evening.       Discharge Instructions: [facility] Discharge instructions Lower Extremity Bypass Surgery  Please refer to the following instruction for your post-procedure care. Your surgeon or physician assistant will discuss any changes with you.  Activity  You are encouraged to walk as much as you can. You can slowly return to normal activities during the month after your surgery. Avoid strenuous activity and heavy lifting until your doctor tells you it's OK. Avoid activities such as vacuuming or swinging a golf club. Do not drive until your doctor give the OK and you are no longer taking prescription pain medications. It is also normal to have difficulty  with sleep habits, eating and bowel movement after surgery. These will go away with time.  Bathing/Showering  You may shower after you go home. Do not soak in a bathtub, hot tub, or swim until the incision heals completely.  Incision Care  Clean your incision with mild soap and water. Shower every day. Pat the area dry with a clean towel. You do not need a bandage unless otherwise instructed. Do not apply any ointments or creams to your incision. If you have open wounds you will be instructed how to care for them or a visiting nurse may be arranged for you. If you have staples or sutures along your incision they will be removed at your post-op appointment. You may have skin glue on your incision. Do not peel it off. It will come off on its own in about one week.  Wash the groin wound with soap and water daily and pat dry. (No tub bath-only shower)  Then put a dry gauze or washcloth in the groin to keep this area dry to help prevent wound infection.  Do this daily and as needed.  Do not use Vaseline or neosporin on your incisions.  Only use soap and water on your incisions and then protect and keep dry.  Diet  Resume your normal diet. There are no special food restrictions following this procedure. A low fat/ low cholesterol diet is recommended for all  patients with vascular disease. In order to heal from your surgery, it is CRITICAL to get adequate nutrition. Your body requires vitamins, minerals, and protein. Vegetables are the best source of vitamins and minerals. Vegetables also provide the perfect balance of protein. Processed food has little nutritional value, so try to avoid this.  Medications  Resume taking all your medications unless your doctor or Physician Assistant tells you not to. If your incision is causing pain, you may take over-the-counter pain relievers such as acetaminophen (Tylenol). If you were prescribed a stronger pain medication, please aware these medication can cause nausea and constipation. Prevent nausea by taking the medication with a snack or meal. Avoid constipation by drinking plenty of fluids and eating foods with high amount of fiber, such as fruits, vegetables, and grains. Take Colace 100 mg (an over-the-counter stool softener) twice a day as needed for constipation.  Do not take Tylenol if you are taking prescription pain medications.  Follow Up  Our office will schedule a follow up appointment 2-3 weeks following discharge.  Please call us immediately for any of the following conditions  .Severe or worsening pain in your legs or feet while at rest or while walking .Increase pain, redness, warmth, or drainage (pus) from your incision site(s) . Fever of 101 degree or higher . The swelling in your leg with the bypass suddenly worsens and becomes more painful than when you were in the hospital . If you have been instructed to feel your graft pulse then you should do so every day. If you can no longer feel this pulse, call the office immediately. Not all patients are given this instruction. .  Leg swelling is common after leg bypass surgery.  The swelling should improve over a few months following surgery. To improve the swelling, you may elevate your legs above the level of your heart while you are sitting or  resting. Your surgeon or physician assistant may ask you to apply an ACE wrap or wear compression (TED) stockings to help to reduce swelling.  Reduce your risk of vascular disease  Stop smoking. If you would like help call [facility] at 1-800-QUIT-NOW 504 868 3737) or [place] at [phone].  . Manage your cholesterol . Maintain a desired weight . Control your diabetes weight . Control your diabetes . Keep your blood pressure down .  If you have any questions, please call the office at [phone]   Prescriptions given: 1.  Patient has oxycodone IR at home filled with her PCP.   Disposition: home with HHPT  Patient's condition: is Good  Follow up: 1. Dr. Scot Dock in 2 weeks   Risa Grill, PA-C [facility] [phone] 09/14/2020  3:08 PM  - For VQI Registry use ---   Post-op:  Wound infection: No  Graft infection: No  Transfusion: No    If yes, 0 units given New Arrhythmia: No Ipsilateral amputation: No, [ ]  Minor, [ ]  BKA, [ ]  AKA Discharge patency: [x ] Primary, [ ]  Primary assisted, [ ]  Secondary, [ ]  Occluded Patency judged by: [x ] Dopper only, [ ]  Palpable graft pulse, []  Palpable distal pulse, [ ]  ABI inc. > 0.15, [ ]  Duplex Discharge ABI: R , L  D/C Ambulatory Status: Ambulatory with Assistance  Complications: MI: No, [ ]  Troponin only, [ ]  EKG or Clinical CHF: No Resp failure:No, [ ]  Pneumonia, [ ]  Ventilator Chg in renal function: No, [ ]  Inc. Cr > 0.5, [ ]  Temp. Dialysis,  [ ]  Permanent dialysis Stroke: No, [ ]  Minor, [ ]  Major Return to OR: No  Reason for return to OR: [ ]  Bleeding, [ ]  Infection, [ ]  Thrombosis, [ ]  Revision  Discharge medications: Statin use:  yes ASA use:  yes Plavix use:  no Beta blocker use: yes CCB use:  Yes ACEI use:   no ARB use:  no Coumadin use: no apixiban

## 2020-09-14 NOTE — Progress Notes (Signed)
Discharge orders received, IV and telemetry removed, CCMD notified.  Pt daughter at bedside for discharge education.  Understanding expressed by all parties.  Volunteers paged for transport off unit, daughter waiting at main entrance to transport home.

## 2020-09-14 NOTE — Progress Notes (Signed)
Mobility Specialist: Progress Note   09/14/20 1222  Mobility  Activity Off unit   Pt discharged.   [facility] Keneshia Tena Mobility Specialist Mobility Specialist Phone: [phone]

## 2020-09-14 NOTE — Progress Notes (Addendum)
   VASCULAR SURGERY ASSESSMENT & PLAN:   POD 2 LEFT FEM ABOVE-KNEE POP BYPASS (PTFE): Her left foot is warm and hyperemic.  Her incisions look fine. VSS. Afebrile. She completed HD yesterday.  Discharge home today. We will plan staged right femoropopliteal bypass once she has recovered from this.  VASCULAR QUALITY INITIATIVE: She is on aspirin and is on a statin.  DVT PROPHYLAXIS:  Eliquis restarted yesterday    SUBJECTIVE:   No complaints.  PHYSICAL EXAM:   Vitals:   09/13/20 1606 09/13/20 1954 09/14/20 0015 09/14/20 0344  BP: 133/67 (!) 97/53 (!) 109/58 (!) 101/50  Pulse: 65 65 73 66  Resp: 17 14 11 13   Temp: 98 F (36.7 C) 97.9 F (36.6 C) 97.7 F (36.5 C) 97.7 F (36.5 C)  TempSrc: Axillary Oral Oral Oral  SpO2: 100% 97% 97% 92%  Weight:      Height:       General appearance: Awake, alert in no apparent distress Cardiac: Heart rate and rhythm are regular Respirations: Nonlabored Incisions: Left groin, thigh  incisions are all well approximated without bleeding or hematoma Extremities: Both feet are warm with intact sensation and motor function.  Brisk left dorsalis pedis Doppler signals. Ischemic changes to toes stable and dry  LABS:   No new labs.  PROBLEM LIST:    Active Problems:   PVD (peripheral vascular disease) (HCC)   CURRENT MEDS:   . amiodarone  200 mg Oral Daily  . amLODipine  5 mg Oral Daily  . apixaban  5 mg Oral BID  . vitamin C  1,000 mg Oral QPM  . aspirin EC  81 mg Oral Daily  . atorvastatin  20 mg Oral Daily  . carvedilol  3.125 mg Oral BID WC  . Chlorhexidine Gluconate Cloth  6 each Topical Q0600  . cholecalciferol  2,000 Units Oral QPM  . docusate sodium  100 mg Oral Daily  . glimepiride  1 mg Oral BID  . pantoprazole  40 mg Oral Daily    Barbie Banner, [place] Office: [phone] 09/14/2020   I have interviewed the patient and examined the patient. I agree with the findings by the PA. Home today.  Gae Gallop,  MD [phone]

## 2020-09-14 NOTE — Discharge Instructions (Signed)
 [facility] [place]  Discharge instructions  Lower Extremity Bypass Surgery  Please refer to the following instruction for your post-procedure care. Your surgeon or physician assistant will discuss any changes with you.  Activity  You are encouraged to walk as much as you can. You can slowly return to normal activities during the month after your surgery. Avoid strenuous activity and heavy lifting until your doctor tells you it's OK. Avoid activities such as vacuuming or swinging a golf club. Do not drive until your doctor give the OK and you are no longer taking prescription pain medications. It is also normal to have difficulty with sleep habits, eating and bowel movement after surgery. These will go away with time.  Bathing/Showering  You may shower after you go home. Do not soak in a bathtub, hot tub, or swim until the incision heals completely.  Incision Care  Clean your incision with mild soap and water. Shower every day. Pat the area dry with a clean towel. You do not need a bandage unless otherwise instructed. Do not apply any ointments or creams to your incision. If you have open wounds you will be instructed how to care for them or a visiting nurse may be arranged for you. If you have staples or sutures along your incision they will be removed at your post-op appointment. You may have skin glue on your incision. Do not peel it off. It will come off on its own in about one week. If you have a great deal of moisture in your groin, use a gauze help keep this area dry.  Diet  Resume your normal diet. There are no special food restrictions following this procedure. A low fat/ low cholesterol diet is recommended for all patients with vascular disease. In order to heal from your surgery, it is CRITICAL to get adequate nutrition. Your body requires vitamins, minerals, and protein. Vegetables are the best source of vitamins and minerals. Vegetables also provide the  perfect balance of protein. Processed food has little nutritional value, so try to avoid this.  Medications  Resume taking all your medications unless your doctor or nurse practitioner tells you not to. If your incision is causing pain, you may take over-the-counter pain relievers such as acetaminophen (Tylenol). If you were prescribed a stronger pain medication, please aware these medication can cause nausea and constipation. Prevent nausea by taking the medication with a snack or meal. Avoid constipation by drinking plenty of fluids and eating foods with high amount of fiber, such as fruits, vegetables, and grains. Take Colase 100 mg (an over-the-counter stool softener) twice a day as needed for constipation. Do not take Tylenol if you are taking prescription pain medications.  Follow Up  Our office will schedule a follow up appointment 2-3 weeks following discharge.  Please call us immediately for any of the following conditions  Severe or worsening pain in your legs or feet while at rest or while walking Increase pain, redness, warmth, or drainage (pus) from your incision site(s) Fever of 101 degree or higher The swelling in your leg with the bypass suddenly worsens and becomes more painful than when you were in the hospital If you have been instructed to feel your graft pulse then you should do so every day. If you can no longer feel this pulse, call the office immediately. Not all patients are given this instruction.  Leg swelling is common after leg bypass surgery.  The swelling should improve over a few months   following surgery. To improve the swelling, you may elevate your legs above the level of your heart while you are sitting or resting. Your surgeon or physician assistant may ask you to apply an ACE wrap or wear compression (TED) stockings to help to reduce swelling.  Reduce your risk of vascular disease  Stop smoking. If you would like help call [facility] at 1-800-QUIT-NOW  (1-800-784-8669) or [place] at 336-586-4000.  Manage your cholesterol Maintain a desired weight Control your diabetes weight Control your diabetes Keep your blood pressure down  If you have any questions, please call the office at [phone]   

## 2020-09-14 NOTE — TOC Transition Note (Signed)
Transition of Care (TOC) - CM/SW Discharge Note [name] RN, BSN Transitions of Care Unit 4E- RN Case Manager See Treatment Team for direct phone #    Patient Details  Name: Kathryn Hancock MRN: [id number] Date of Birth: [birth year]  Transition of Care [facility]) CM/SW Contact:  [name], RN Phone Number: 09/14/2020, 2:12 PM   Clinical Narrative:    Pt stable for transition home today, noted recommendations for HHPT/OT- per pt she is already active with [facility]. Call made to [facility] to confirm [facility] services.  Bedside RN to contact attending team for new [facility] orders.   Per Tommi Rumps with [name] pt is active with them for HHPT/OT.  Will resume [facility] services on discharge.    Final next level of care: [place] Barriers to Discharge: No Barriers Identified   Patient Goals and CMS Choice Patient states their goals for this hospitalization and ongoing recovery are:: return home CMS Medicare.gov Compare Post Acute Care list provided to:: Patient Choice offered to / list presented to : Patient  Discharge Placement               Home with [facility]        Discharge Plan and Services   Discharge Planning Services: CM Consult Post Acute Care Choice: Home Health,Resumption of Svcs/PTA Provider                    HH Arranged: PT HH Agency: [place] Date [place]: 09/14/20 Time HH Agency Contacted: 1200 Representative spoke with at [place]: [place] Interventions     Readmission Risk Interventions No flowsheet data found.

## 2020-09-16 ENCOUNTER — Telehealth: Payer: Self-pay

## 2020-09-16 NOTE — Telephone Encounter (Signed)
Spoke with pts daughter [name]. Updated her on arrival time for surgery on 10/03/20 changed to 0730 am at [facility]. She verbalized understanding.

## 2020-09-28 ENCOUNTER — Other Ambulatory Visit: Payer: Self-pay

## 2020-09-28 ENCOUNTER — Ambulatory Visit (INDEPENDENT_AMBULATORY_CARE_PROVIDER_SITE_OTHER): Payer: Medicare Other | Admitting: Physician Assistant

## 2020-09-28 VITALS — BP 114/55 | HR 74 | Temp 97.7°F | Resp 20 | Ht 63.0 in | Wt 138.9 lb

## 2020-09-28 DIAGNOSIS — Z8616 Personal history of COVID-19: Secondary | ICD-10-CM | POA: Insufficient documentation

## 2020-09-28 DIAGNOSIS — I739 Peripheral vascular disease, unspecified: Secondary | ICD-10-CM

## 2020-09-28 NOTE — Progress Notes (Signed)
POST OPERATIVE OFFICE NOTE    CC:  F/u for surgery  HPI:  This is a 70 y.o. female who is s/p left femoral to popliteal bypass on 09/12/2020 by Dr. Scot Dock.    Pt returns today for follow up.  Pt states her left foot feels better.  She states she does have some swelling in her feet.  She states that she has started dialysis.  When she went home on the Wed after surgery, she felt good.  After dialysis on Thursday, she felt weak and almost fell but her grandson lowered her to the ground.  The dialysis center [facility]) has adjusted her treatments and they feel that is why she is having more swelling.  She states she has some wounds on her hips as well as her tailbone.  She is scheduled on Monday for 2nd stage left BVT with Dr. Carlis Abbott.  She is currently dialyzing via [place].  Allergies  Allergen Reactions  . Penicillins Other (See Comments)    Childhood reaction.    Current Outpatient Medications  Medication Sig Dispense Refill  . acetaminophen (TYLENOL) 500 MG tablet Take 500-1,000 mg by mouth every 6 (six) hours as needed for moderate pain.    Marland Kitchen amiodarone (PACERONE) 200 MG tablet Take 200 mg by mouth daily.    Marland Kitchen amLODipine (NORVASC) 5 MG tablet Take 5 mg by mouth daily.    Marland Kitchen apixaban (ELIQUIS) 5 MG TABS tablet Take 1 tablet (5 mg total) by mouth 2 (two) times daily. 60 tablet 6  . Ascorbic Acid (VITAMIN C) 1000 MG tablet Take 1,000 mg by mouth every evening.    Marland Kitchen aspirin EC 81 MG tablet Take 81 mg by mouth daily. Swallow whole.    Marland Kitchen atorvastatin (LIPITOR) 20 MG tablet Take 20 mg by mouth daily.    . carvedilol (COREG) 6.25 MG tablet TAKE 1 TABLET (6.25 MG TOTAL) BY MOUTH TWO TIMES DAILY WITH A MEAL. 60 tablet 0  . docusate sodium (COLACE) 100 MG capsule Take 100 mg by mouth daily as needed for mild constipation.    . famotidine (PEPCID) 20 MG tablet Take 20 mg by mouth daily as needed for headache.    . fluticasone (FLONASE) 50 MCG/ACT nasal spray Place 2 sprays into both nostrils as needed  for allergies or rhinitis.    . folic acid (FOLVITE) 497 MCG tablet Take 800 mcg by mouth daily.    Marland Kitchen glimepiride (AMARYL) 1 MG tablet Take 1 mg by mouth 2 (two) times daily. Hold if blood sugar is less than 120    . glucose blood test strip 1 each by Other route as needed. Use as instructed    . lidocaine-prilocaine (EMLA) cream Apply 1 application topically daily as needed (dialysis).    Marland Kitchen loperamide (IMODIUM A-D) 2 MG tablet Take 2 mg by mouth 4 (four) times daily as needed for diarrhea or loose stools.    . Methoxy PEG-Epoetin Beta (MIRCERA IJ) Mircera    . montelukast (SINGULAIR) 10 MG tablet Take 10 mg by mouth daily as needed (allergies).    Marland Kitchen oxyCODONE (OXY IR/ROXICODONE) 5 MG immediate release tablet Take 5-10 mg by mouth 2 (two) times daily as needed for moderate pain.    . Vitamin D3 (VITAMIN D) 25 MCG tablet Take 2,000 Units by mouth every evening.    . sulfamethoxazole-trimethoprim (BACTRIM) 400-80 MG tablet sulfamethoxazole 400 mg-trimethoprim 80 mg tablet  TAKE 1 TABLET BY MOUTH TWICE DAILY     No current facility-administered medications  for this visit.     ROS:  See HPI  Physical Exam:  Today's Vitals   09/28/20 1407  BP: (!) 114/55  Pulse: 74  Resp: 20  Temp: 97.7 F (36.5 C)  TempSrc: Temporal  Weight: 138 lb 14.4 oz (63 kg)  Height: 5\' 3"  (1.6 m)   Body mass index is 24.61 kg/m.   Incision:  Left groin incision healing nicely.  There is mild bloody drainage from the distal incision proximally Extremities:  Monophasic doppler signals left DP/PT and brisk biphasic peroneal doppler signals.  On the right, she has monophasic DP/PT/peroneal.      Assessment/Plan:  This is a 70 y.o. female who is s/p: left femoral to popliteal bypass on 09/12/2020 by Dr. Scot Dock.     -pt seen with Dr. Scot Dock.  He advised pt to try to keep legs elevated, however, she has a hard time with this given her wounds on hips and tailbone.  She will elevate as she can.   -pt with brisk  biphasic peroneal doppler signal left foot.  -she has 2nd stage BVT scheduled on Monday 4/25.   -pt will come back to see Dr. Scot Dock in 3-4 weeks and be re-evaluated for right lower extremity bypass.   -bilateral wounds on lateral buttocks.  Pt does have palpable femoral pulses bilaterally.  ? Calciphylaxis given she in on HD or possible pressure related.  -continue asa/statin -discussed smoking cessation   Leontine Locket, [facility] [phone]   Clinic MD:  Scot Dock

## 2020-09-29 ENCOUNTER — Emergency Department (HOSPITAL_COMMUNITY): Payer: Medicare Other

## 2020-09-29 ENCOUNTER — Other Ambulatory Visit: Payer: Self-pay

## 2020-09-29 ENCOUNTER — Inpatient Hospital Stay (HOSPITAL_COMMUNITY)
Admission: EM | Admit: 2020-09-29 | Discharge: 2020-10-03 | DRG: 981 | Disposition: A | Discharge status: Home Health | Payer: Medicare Other | Attending: Family Medicine | Admitting: Family Medicine

## 2020-09-29 DIAGNOSIS — E46 Unspecified protein-calorie malnutrition: Secondary | ICD-10-CM | POA: Diagnosis present

## 2020-09-29 DIAGNOSIS — Z6823 Body mass index (BMI) 23.0-23.9, adult: Secondary | ICD-10-CM

## 2020-09-29 DIAGNOSIS — E1122 Type 2 diabetes mellitus with diabetic chronic kidney disease: Secondary | ICD-10-CM | POA: Diagnosis present

## 2020-09-29 DIAGNOSIS — R7989 Other specified abnormal findings of blood chemistry: Secondary | ICD-10-CM | POA: Diagnosis present

## 2020-09-29 DIAGNOSIS — R531 Weakness: Secondary | ICD-10-CM

## 2020-09-29 DIAGNOSIS — I714 Abdominal aortic aneurysm, without rupture: Secondary | ICD-10-CM | POA: Diagnosis present

## 2020-09-29 DIAGNOSIS — K219 Gastro-esophageal reflux disease without esophagitis: Secondary | ICD-10-CM | POA: Diagnosis present

## 2020-09-29 DIAGNOSIS — D500 Iron deficiency anemia secondary to blood loss (chronic): Principal | ICD-10-CM | POA: Diagnosis present

## 2020-09-29 DIAGNOSIS — E782 Mixed hyperlipidemia: Secondary | ICD-10-CM | POA: Diagnosis present

## 2020-09-29 DIAGNOSIS — E1165 Type 2 diabetes mellitus with hyperglycemia: Secondary | ICD-10-CM | POA: Diagnosis present

## 2020-09-29 DIAGNOSIS — D631 Anemia in chronic kidney disease: Secondary | ICD-10-CM | POA: Diagnosis present

## 2020-09-29 DIAGNOSIS — Z880 Allergy status to penicillin: Secondary | ICD-10-CM

## 2020-09-29 DIAGNOSIS — E1152 Type 2 diabetes mellitus with diabetic peripheral angiopathy with gangrene: Secondary | ICD-10-CM | POA: Diagnosis present

## 2020-09-29 DIAGNOSIS — D72829 Elevated white blood cell count, unspecified: Secondary | ICD-10-CM | POA: Diagnosis present

## 2020-09-29 DIAGNOSIS — E440 Moderate protein-calorie malnutrition: Secondary | ICD-10-CM | POA: Insufficient documentation

## 2020-09-29 DIAGNOSIS — Z8249 Family history of ischemic heart disease and other diseases of the circulatory system: Secondary | ICD-10-CM

## 2020-09-29 DIAGNOSIS — Z7901 Long term (current) use of anticoagulants: Secondary | ICD-10-CM

## 2020-09-29 DIAGNOSIS — Z8616 Personal history of COVID-19: Secondary | ICD-10-CM

## 2020-09-29 DIAGNOSIS — Z951 Presence of aortocoronary bypass graft: Secondary | ICD-10-CM

## 2020-09-29 DIAGNOSIS — Z992 Dependence on renal dialysis: Secondary | ICD-10-CM

## 2020-09-29 DIAGNOSIS — N186 End stage renal disease: Secondary | ICD-10-CM

## 2020-09-29 DIAGNOSIS — Z833 Family history of diabetes mellitus: Secondary | ICD-10-CM

## 2020-09-29 DIAGNOSIS — N2581 Secondary hyperparathyroidism of renal origin: Secondary | ICD-10-CM | POA: Diagnosis present

## 2020-09-29 DIAGNOSIS — F32A Depression, unspecified: Secondary | ICD-10-CM | POA: Diagnosis present

## 2020-09-29 DIAGNOSIS — D649 Anemia, unspecified: Principal | ICD-10-CM

## 2020-09-29 DIAGNOSIS — Z7984 Long term (current) use of oral hypoglycemic drugs: Secondary | ICD-10-CM

## 2020-09-29 DIAGNOSIS — I10 Essential (primary) hypertension: Secondary | ICD-10-CM | POA: Diagnosis present

## 2020-09-29 DIAGNOSIS — E1169 Type 2 diabetes mellitus with other specified complication: Secondary | ICD-10-CM | POA: Diagnosis present

## 2020-09-29 DIAGNOSIS — Z8679 Personal history of other diseases of the circulatory system: Secondary | ICD-10-CM

## 2020-09-29 DIAGNOSIS — E8889 Other specified metabolic disorders: Secondary | ICD-10-CM | POA: Diagnosis present

## 2020-09-29 DIAGNOSIS — Z7982 Long term (current) use of aspirin: Secondary | ICD-10-CM

## 2020-09-29 DIAGNOSIS — I120 Hypertensive chronic kidney disease with stage 5 chronic kidney disease or end stage renal disease: Secondary | ICD-10-CM | POA: Diagnosis present

## 2020-09-29 DIAGNOSIS — E871 Hypo-osmolality and hyponatremia: Secondary | ICD-10-CM | POA: Diagnosis present

## 2020-09-29 DIAGNOSIS — I480 Paroxysmal atrial fibrillation: Secondary | ICD-10-CM | POA: Diagnosis present

## 2020-09-29 DIAGNOSIS — K5711 Diverticulosis of small intestine without perforation or abscess with bleeding: Secondary | ICD-10-CM | POA: Diagnosis present

## 2020-09-29 DIAGNOSIS — I959 Hypotension, unspecified: Secondary | ICD-10-CM | POA: Diagnosis present

## 2020-09-29 DIAGNOSIS — F17210 Nicotine dependence, cigarettes, uncomplicated: Secondary | ICD-10-CM | POA: Diagnosis present

## 2020-09-29 DIAGNOSIS — I2510 Atherosclerotic heart disease of native coronary artery without angina pectoris: Secondary | ICD-10-CM | POA: Diagnosis present

## 2020-09-29 DIAGNOSIS — K2961 Other gastritis with bleeding: Secondary | ICD-10-CM | POA: Diagnosis present

## 2020-09-29 DIAGNOSIS — I739 Peripheral vascular disease, unspecified: Secondary | ICD-10-CM

## 2020-09-29 DIAGNOSIS — E8770 Fluid overload, unspecified: Secondary | ICD-10-CM | POA: Diagnosis present

## 2020-09-29 DIAGNOSIS — Z79899 Other long term (current) drug therapy: Secondary | ICD-10-CM

## 2020-09-29 DIAGNOSIS — K5521 Angiodysplasia of colon with hemorrhage: Secondary | ICD-10-CM | POA: Diagnosis present

## 2020-09-29 DIAGNOSIS — Z20822 Contact with and (suspected) exposure to covid-19: Secondary | ICD-10-CM | POA: Diagnosis present

## 2020-09-29 LAB — CBC WITH DIFFERENTIAL/PLATELET
Abs Immature Granulocytes: 0.33 10*3/uL — ABNORMAL HIGH (ref 0.00–0.07)
Basophils Absolute: 0.1 10*3/uL (ref 0.0–0.1)
Basophils Relative: 0 %
Eosinophils Absolute: 0.3 10*3/uL (ref 0.0–0.5)
Eosinophils Relative: 1 %
HCT: 27.9 % — ABNORMAL LOW (ref 36.0–46.0)
Hemoglobin: 8.2 g/dL — ABNORMAL LOW (ref 12.0–15.0)
Immature Granulocytes: 2 %
Lymphocytes Relative: 11 %
Lymphs Abs: 1.9 10*3/uL (ref 0.7–4.0)
MCH: 33.1 pg (ref 26.0–34.0)
MCHC: 29.4 g/dL — ABNORMAL LOW (ref 30.0–36.0)
MCV: 112.5 fL — ABNORMAL HIGH (ref 80.0–100.0)
Monocytes Absolute: 0.5 10*3/uL (ref 0.1–1.0)
Monocytes Relative: 3 %
Neutro Abs: 14.7 10*3/uL — ABNORMAL HIGH (ref 1.7–7.7)
Neutrophils Relative %: 83 %
Platelets: 296 10*3/uL (ref 150–400)
RBC: 2.48 MIL/uL — ABNORMAL LOW (ref 3.87–5.11)
RDW: 22.1 % — ABNORMAL HIGH (ref 11.5–15.5)
WBC: 17.6 10*3/uL — ABNORMAL HIGH (ref 4.0–10.5)
nRBC: 0 % (ref 0.0–0.2)

## 2020-09-29 LAB — RESP PANEL BY RT-PCR (FLU A&B, COVID) ARPGX2
Influenza A by PCR: NEGATIVE
Influenza B by PCR: NEGATIVE
SARS Coronavirus 2 by RT PCR: NEGATIVE

## 2020-09-29 LAB — GLUCOSE, CAPILLARY: Glucose-Capillary: 172 mg/dL — ABNORMAL HIGH (ref 70–99)

## 2020-09-29 LAB — PREPARE RBC (CROSSMATCH)

## 2020-09-29 LAB — COMPREHENSIVE METABOLIC PANEL
ALT: 13 U/L (ref 0–44)
AST: 15 U/L (ref 15–41)
Albumin: 2.4 g/dL — ABNORMAL LOW (ref 3.5–5.0)
Alkaline Phosphatase: 169 U/L — ABNORMAL HIGH (ref 38–126)
Anion gap: 10 (ref 5–15)
BUN: 10 mg/dL (ref 8–23)
CO2: 27 mmol/L (ref 22–32)
Calcium: 8.1 mg/dL — ABNORMAL LOW (ref 8.9–10.3)
Chloride: 98 mmol/L (ref 98–111)
Creatinine, Ser: 1.59 mg/dL — ABNORMAL HIGH (ref 0.44–1.00)
GFR, Estimated: 35 mL/min — ABNORMAL LOW (ref >60)
Glucose, Bld: 181 mg/dL — ABNORMAL HIGH (ref 70–99)
Potassium: 4.3 mmol/L (ref 3.5–5.1)
Sodium: 135 mmol/L (ref 135–145)
Total Bilirubin: 0.6 mg/dL (ref 0.3–1.2)
Total Protein: 6 g/dL — ABNORMAL LOW (ref 6.5–8.1)

## 2020-09-29 LAB — HEMOGLOBIN A1C
Hgb A1c MFr Bld: 11.1 % — ABNORMAL HIGH (ref 4.8–5.6)
Mean Plasma Glucose: 271.87 mg/dL

## 2020-09-29 LAB — POC OCCULT BLOOD, ED: Fecal Occult Bld: POSITIVE — AB

## 2020-09-29 MED ORDER — SODIUM CHLORIDE 0.9% IV SOLUTION: Freq: Once | INTRAVENOUS | Status: DC

## 2020-09-29 MED ORDER — OXYCODONE HCL 5 MG PO TABS
5.0000 mg | ORAL_TABLET | Freq: Four times a day (QID) | ORAL | Status: DC | PRN
Start: 2020-09-29 — End: 2020-10-01
  Administered 2020-09-29 – 2020-10-01 (×6): 5 mg via ORAL
  Filled 2020-09-29 (×6): qty 1

## 2020-09-29 MED ORDER — CARVEDILOL 6.25 MG PO TABS
6.2500 mg | ORAL_TABLET | Freq: Two times a day (BID) | ORAL | Status: DC
  Administered 2020-09-29 – 2020-10-03 (×6): 6.25 mg via ORAL
  Filled 2020-09-29 (×6): qty 1

## 2020-09-29 MED ORDER — ATORVASTATIN CALCIUM 10 MG PO TABS
20.0000 mg | ORAL_TABLET | Freq: Every day | ORAL | Status: DC
  Administered 2020-09-29 – 2020-10-03 (×4): 20 mg via ORAL
  Filled 2020-09-29 (×4): qty 2

## 2020-09-29 MED ORDER — PANTOPRAZOLE SODIUM 40 MG PO TBEC
40.0000 mg | DELAYED_RELEASE_TABLET | Freq: Every day | ORAL | Status: DC
  Administered 2020-09-29 – 2020-09-30 (×2): 40 mg via ORAL
  Filled 2020-09-29 (×2): qty 1

## 2020-09-29 MED ORDER — INSULIN ASPART 100 UNIT/ML ~~LOC~~ SOLN
0.0000 [IU] | Freq: Three times a day (TID) | SUBCUTANEOUS | Status: DC
  Administered 2020-10-03: 3 [IU] via SUBCUTANEOUS

## 2020-09-29 MED ORDER — SODIUM CHLORIDE 0.9 % IV SOLN: 10.0000 mL/h | Freq: Once | INTRAVENOUS | Status: DC

## 2020-09-29 MED ORDER — AMIODARONE HCL 200 MG PO TABS
200.0000 mg | ORAL_TABLET | Freq: Every day | ORAL | Status: DC
  Administered 2020-09-29 – 2020-10-03 (×3): 200 mg via ORAL
  Filled 2020-09-29 (×4): qty 1

## 2020-09-29 MED ORDER — POLYETHYLENE GLYCOL 3350 17 G PO PACK
17.0000 g | PACK | Freq: Every day | ORAL | Status: DC | PRN
  Administered 2020-10-01: 17 g via ORAL
  Filled 2020-09-29: qty 1

## 2020-09-29 NOTE — ED Provider Notes (Addendum)
[place] EMERGENCY DEPARTMENT Provider Note   CSN: [id number] Arrival date & time: 09/29/20  1227     History Chief Complaint  Patient presents with  . Weakness    Kathryn Hancock is a 70 y.o. female.  Patient sent in from dialysis.  Patient's been receiving dialysis since January.  Patient also followed by vascular surgery.  Patient reports melena stools since December.  Patient was sent in because they thought that hemoglobin was in the 6 range.  And patient also blood pressures were marginal postdialysis.  Patient is normally dialyzed Tuesday Thursdays and Saturdays.  Was dialyzed on Tuesday as well.  Patient has a temporary dialysis catheter in her right anterior chest.  And they are in the process of maturing her left upper extremity AV fistula.  Patient recently had bypass graft surgery to her lower extremity.  A femoral-popliteal bypass graft left on April 4.  Patient had AV fistula placement started on January 31.  Past medical history significant for abdominal aortic aneurysm hypertension coronary artery disease status post coronary bypass.  End-stage renal dialysis starting in January.  History of atrial fibrillation.  Diabetes hyperlipidemia dry gangrene of toes of both feet noted starting in December.  And a history of anemia.        Past Medical History:  Diagnosis Date  . AAA (abdominal aortic aneurysm) ([place]) 06/2019   Multiple small pseudoaneurysmal projections of the dominant aorta.  Distal abdominal aortic aneurysm 4.5 x 4.7 cm.  Greatest AP dimension of the infrarenal aorta is 4.9.  No evidence of thoracic aortic aneurysm or dissection.  Brief segment of proximal IMA occlusion.  . Anemia   . Coronary artery calcification seen on CAT scan 06/2019  . Coronary artery disease involving native coronary artery of native heart with unstable angina pectoris ([place]) 06/01/2020   Cardiac [facility] at [facility] 06/01/2020-preop for atrial myxoma resection -> severe proximal  RCA and PDA -> had single-vessel CABG with SVG-PDA along with myxoma resection.  . Depression   . DM (diabetes mellitus), type 2 with renal complications ([place]) 0/63/0160  . Dry gangrene ([place]) -> right foot-toes 05/27/2020  . Dyspnea   . ESRD (end stage renal disease) on dialysis ([place]) 06/2020   Progression of CKDIIIb to ESRD initially related to thromboembolic event from left atrial myxoma; complicated by perioperative hypotension-; now 1 on HD TU/TH/SAT  . GERD (gastroesophageal reflux disease)   . Heavy smoker (more than 20 cigarettes per day)    ~ 2 ppd; since age 63 (100 pk yr) = has cut down to one half PPD.>  . Hyperlipidemia    Mixed  . Hyperlipidemia associated with type 2 diabetes mellitus ([place]) 08/05/2020  . Hypertension   . 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 = Status post resection December 23,2021 (done at [facility] because of no bed availability at [facility]  . Microscopic hematuria   . PAF (paroxysmal atrial fibrillation) ([place]) 07/01/2020   Initially noted postoperatively-left atrial myxoma resection and CABG x1.  Now on amiodarone and apixaban..  . Peripheral vascular disease ([place])   . Plantar wart of right foot   . PONV (postoperative nausea and vomiting)     Patient Active Problem List   Diagnosis Date Noted  . Personal history of COVID-19 09/28/2020  . PVD (peripheral vascular disease) ([place]) 09/12/2020  . COVID-19 08/22/2020  . Hyperlipidemia associated with type 2 diabetes mellitus ([place]) 08/05/2020  .  Hypokalemia 07/27/2020  . Unspecified abnormal findings in urine 07/26/2020  . Unspecified severe protein-calorie malnutrition ([place]) 07/21/2020  . Encounter for immunization 07/19/2020  . 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[place]) 07/16/2020  . Coagulation defect, unspecified ([place]) 07/16/2020  . Allergy, unspecified, initial encounter 07/15/2020  . Anaphylactic shock, unspecified, initial  encounter 07/15/2020  . 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  . Pain, unspecified 07/15/2020  . Pruritus, unspecified 07/15/2020  . Secondary hyperparathyroidism of renal origin ([place]) 07/15/2020  . Shortness of breath 07/15/2020  . PAOD (peripheral arterial occlusive disease) ([place]) 07/08/2020  . ESRD needing dialysis ([place]) 07/01/2020  . PAF (paroxysmal atrial fibrillation) ([place]) 07/01/2020  . Anemia of chronic disease 07/01/2020  . Essential hypertension 07/01/2020  . DM (diabetes mellitus), type 2 with renal complications ([place]) 74/94/4967  . Cellulitis of toe 06/05/2020  . S/P CABG x 1 06/02/2020  . Coronary artery disease involving native coronary artery of native heart with unstable angina pectoris ([place]) 06/01/2020  . CKD (chronic kidney disease) stage 3, GFR 30-59 ml/min ([facility]) 06/01/2020  . HLD (hyperlipidemia) 06/01/2020  . Dry gangrene ([place]) -> right foot-toes 05/27/2020  . 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 Tobacco abuse 12/10/2019  . AAA (abdominal aortic aneurysm) without rupture ([place]) 12/10/2019    Past Surgical History:  Procedure Laterality Date  . ABDOMINAL AORTOGRAM W/LOWER EXTREMITY N/A 07/08/2020   Procedure: ABDOMINAL AORTOGRAM W/LOWER EXTREMITY;  Surgeon: Angelia Mould, MD;  Location: [place] CV LAB;  Service: Cardiovascular;  Laterality: N/A;  . AV FISTULA PLACEMENT Left 07/11/2020   Procedure: LEFT FIRST STAGE BRACHIOBASILIC UPPER EXTREMITY ARTERIOVENOUS (AV) FISTULA CREATION;  Surgeon: [name], MD;  Location: [place];  Service: Vascular;  Laterality: Left;  . Cardiac MRI  01/22/2020   Normal LV size and function.  Moderate focal basal septal hypertrophy.  No S.A.M.  LVEF 61%.  RVEF 66%.  Large mobile mass in the left atrium attached to the interatrial septum-does not appear to thrombus characteristics consistent with left atrial myxoma.  . Chest CTA  06/2019   Multiple small pseudoaneurysmal projections of the dominant aorta.  Distal abdominal  aortic aneurysm 4.5 x 4.7 cm.  Greatest AP dimension of the infrarenal aorta is 4.9.  No evidence of thoracic aortic aneurysm or dissection.  Brief segment of proximal IMA occlusion.  Large geographic filling defect of the left atrium with associated dense radiopaque material.  Aortic atherosclerosis and emphysema  . COLONOSCOPY W/ POLYPECTOMY    . CORONARY ARTERY BYPASS GRAFT     05/2020 at [facility]  . EYE SURGERY Bilateral    Cataract surgery  . FEMORAL-POPLITEAL BYPASS GRAFT Left 09/12/2020   Procedure: LEFT FEMORAL-ABOVE KNEE POPLITEAL ARTERY BYPASS GRAFT;  Surgeon: Angelia Mould, MD;  Location: [place];  Service: Vascular;  Laterality: Left;  . IR FLUORO GUIDE CV LINE RIGHT  07/06/2020  . IR US GUIDE VASC ACCESS RIGHT  07/06/2020  . TRANSTHORACIC ECHOCARDIOGRAM  07/2019    EF 60 to 65%.  GR 1 DD.  Elevated LVEDP.  Large size ill-defined echodensity in the left atrium suspicious for thrombus versus tumor.  Moderately dilated LA.  . TUBAL LIGATION       OB History   No obstetric history on file.     Family History  Problem Relation Age of Onset  . Diabetes Mother   . Diabetes Father   . Heart attack Father 62  . CAD Father   . Hyperlipidemia Father   . Hypertension Father  Social History   Tobacco Use  . Smoking status: Current Every Day Smoker    Packs/day: 0.50    Years: 50.00    Pack years: 25.00    Types: Cigarettes  . Smokeless tobacco: Never Used  Vaping Use  . Vaping Use: Never used  Substance Use Topics  . Alcohol use: Not Currently  . Drug use: Never    Home Medications Prior to Admission medications   Medication Sig Start Date End Date Taking? Authorizing Provider  acetaminophen (TYLENOL) 500 MG tablet Take 500-1,000 mg by mouth every 6 (six) hours as needed for moderate pain.    [provider]  amiodarone (PACERONE) 200 MG tablet Take 200 mg by mouth daily. 06/10/20 06/10/21  [provider]  amLODipine (NORVASC) 5 MG tablet Take 5  mg by mouth daily. 06/10/20 06/10/21  [provider]  apixaban (ELIQUIS) 5 MG TABS tablet Take 1 tablet (5 mg total) by mouth 2 (two) times daily. 12/10/19   Leonie Man, MD  Ascorbic Acid (VITAMIN C) 1000 MG tablet Take 1,000 mg by mouth every evening.    [provider]  aspirin EC 81 MG tablet Take 81 mg by mouth daily. Swallow whole.    [provider]  atorvastatin (LIPITOR) 20 MG tablet Take 20 mg by mouth daily.    [provider]  carvedilol (COREG) 6.25 MG tablet TAKE 1 TABLET (6.25 MG TOTAL) BY MOUTH TWO TIMES DAILY WITH A MEAL. 07/15/20 07/15/21  Darliss Cheney, MD  docusate sodium (COLACE) 100 MG capsule Take 100 mg by mouth daily as needed for mild constipation.    [provider]  famotidine (PEPCID) 20 MG tablet Take 20 mg by mouth daily as needed for headache.    [provider]  fluticasone (FLONASE) 50 MCG/ACT nasal spray Place 2 sprays into both nostrils as needed for allergies or rhinitis.    [provider]  folic acid (FOLVITE) 614 MCG tablet Take 800 mcg by mouth daily.    [provider]  glimepiride (AMARYL) 1 MG tablet Take 1 mg by mouth 2 (two) times daily. Hold if blood sugar is less than 120    [provider]  glucose blood test strip 1 each by Other route as needed. Use as instructed    [provider]  lidocaine-prilocaine (EMLA) cream Apply 1 application topically daily as needed (dialysis). 08/12/20   [provider]  loperamide (IMODIUM A-D) 2 MG tablet Take 2 mg by mouth 4 (four) times daily as needed for diarrhea or loose stools.    [provider]  Methoxy PEG-Epoetin Beta (MIRCERA IJ) Mircera 09/20/20   [provider]  montelukast (SINGULAIR) 10 MG tablet Take 10 mg by mouth daily as needed (allergies).    [provider]  oxyCODONE (OXY IR/ROXICODONE) 5 MG immediate release tablet Take 5-10 mg by mouth 2 (two) times daily as needed for  moderate pain. 06/09/20   [provider]  sulfamethoxazole-trimethoprim (BACTRIM) 400-80 MG tablet sulfamethoxazole 400 mg-trimethoprim 80 mg tablet  TAKE 1 TABLET BY MOUTH TWICE DAILY    [provider]  Vitamin D3 (VITAMIN D) 25 MCG tablet Take 2,000 Units by mouth every evening.    [provider]    Allergies    Penicillins  Review of Systems   Review of Systems  Constitutional: Positive for fatigue. Negative for chills and fever.  HENT: Negative for rhinorrhea and sore throat.   Eyes: Negative for visual disturbance.  Respiratory: Negative for cough and shortness of breath.   Cardiovascular: Negative for chest pain and leg swelling.  Gastrointestinal: Positive for blood in stool. Negative for abdominal pain, diarrhea, nausea and vomiting.  Genitourinary: Negative for dysuria.  Musculoskeletal: Negative for back pain and neck pain.  Skin: Positive for pallor. Negative for rash.  Neurological: Negative for dizziness, light-headedness and headaches.  Hematological: Does not bruise/bleed easily.  Psychiatric/Behavioral: Negative for confusion.    Physical Exam Updated Vital Signs BP (!) 97/36   Pulse 71   Temp 98 F (36.7 C) (Oral)   Resp 15   SpO2 100%   Physical Exam Vitals and nursing note reviewed.  Constitutional:      General: She is not in acute distress.    Appearance: Normal appearance. She is well-developed.  HENT:     Head: Normocephalic and atraumatic.  Eyes:     Conjunctiva/sclera: Conjunctivae normal.     Pupils: Pupils are equal, round, and reactive to light.  Cardiovascular:     Rate and Rhythm: Normal rate and regular rhythm.     Heart sounds: No murmur heard.   Pulmonary:     Effort: Pulmonary effort is normal. No respiratory distress.     Breath sounds: Normal breath sounds.     Comments: Temporary dialysis catheter right anterior chest. Abdominal:     Palpations: Abdomen is soft.     Tenderness: There is no  abdominal tenderness.  Genitourinary:    Comments: Rectal exam with well formed stool.  Dark brown.  Not black not maroon no red gross blood.  Hemoccult pending Musculoskeletal:        General: No swelling. Normal range of motion.     Cervical back: Neck supple.     Comments: The fistula left upper extremity.  Edema to both lower extremities.  Some dry gangrene to all 10 toes distally.  Skin:    General: Skin is warm and dry.     Coloration: Skin is pale.  Neurological:     General: No focal deficit present.     Mental Status: She is alert and oriented to person, place, and time.     Cranial Nerves: No cranial nerve deficit.     Sensory: No sensory deficit.     ED Results / Procedures / Treatments   Labs (all labs ordered are listed, but only abnormal results are displayed) Labs Reviewed  CBC WITH DIFFERENTIAL/PLATELET - Abnormal; Notable for the following components:      Result Value   WBC 17.6 (*)    RBC 2.48 (*)    Hemoglobin 8.2 (*)    HCT 27.9 (*)    MCV 112.5 (*)    MCHC 29.4 (*)    RDW 22.1 (*)    Neutro Abs 14.7 (*)    Abs Immature Granulocytes 0.33 (*)    All other components within normal limits  POC OCCULT BLOOD, ED - Abnormal; Notable for the following components:   Fecal Occult Bld POSITIVE (*)    All other components within normal limits  COMPREHENSIVE METABOLIC PANEL  TYPE AND SCREEN    EKG None  Radiology DG Chest 1 View  Result Date: 09/29/2020 CLINICAL DATA:  Weakness. Dialysis patient. EXAM: CHEST  1 VIEW COMPARISON:  07/01/2020 FINDINGS: Normal sized heart. Stable post CABG changes. Interval right jugular double-lumen catheter with the lumen tips in the superior vena cava. Clear lungs with normal vascularity. Unremarkable bones. IMPRESSION: No acute abnormality. Electronically Signed   By: Remo Lipps  Joneen Caraway M.D.   On: 09/29/2020 13:31    Procedures Procedures   CRITICAL CARE Performed by: Fredia Sorrow Total critical care time: 35  minutes Critical care time was exclusive of separately billable procedures and treating other patients. Critical care was necessary to treat or prevent imminent or life-threatening deterioration. Critical care was time spent personally by me on the following activities: development of treatment plan with patient and/or surrogate as well as nursing, discussions with consultants, evaluation of patient's response to treatment, examination of patient, obtaining history from patient or surrogate, ordering and performing treatments and interventions, ordering and review of laboratory studies, ordering and review of radiographic studies, pulse oximetry and re-evaluation of patient's condition.  Medications Ordered in ED Medications - No data to display  ED Course  I have reviewed the triage vital signs and the nursing notes.  Pertinent labs & imaging results that were available during my care of the patient were reviewed by me and considered in my medical decision making (see chart for details).    MDM Rules/Calculators/A&P                          Patient's blood pressures initially upon arrival were in the upper 60s 90s.  But now without any IV fluids blood pressure is 102.  Patient not tachycardic.  Chest x-ray negative for any acute abnormalities.  Electrolytes are pending.  Hemoccults pending.  But no gross blood on rectal exam.  Hemoglobin here today is 8.2.  Platelets are normal.  There is a leukocytosis with a white blood cell count of 17.6.  Patient is afebrile.  I think without gross blood patient may or may not require transfusion.  Will discuss with nephrology.  Patient's hemoglobin not below 8.   It was reported that it was 6.8 a week ago.  Patient last had a blood transfusion on April 7.  Hemoccult is positive.  Will discuss with nephrology.  Patient is probably a little bit hemoconcentrated postdialysis right now with a hemoglobin 8.2 we will see whether they want to consider  transfusion.  Since patient is here.  Discussed with nephrology Dr. Burnett Sheng recommended go ahead and bring her in and transfuse her most likely her hemoglobin will drop below 8.2 as her volume increases.  Patient's stool was heme positive but not grossly bloody.   Final Clinical Impression(s) / ED Diagnoses Final diagnoses:  Anemia, unspecified type  ESRD on dialysis [facility])    Rx / DC Orders ED Discharge Orders    None       Fredia Sorrow, MD 09/29/20 1440    Fredia Sorrow, MD 09/29/20 1446    Fredia Sorrow, MD 09/29/20 1554    Fredia Sorrow, MD 09/29/20 1645

## 2020-09-29 NOTE — ED Notes (Signed)
Obtained consent for blood transfusion

## 2020-09-29 NOTE — ED Notes (Signed)
Attempted report, nurse not available and will call back.

## 2020-09-29 NOTE — ED Triage Notes (Signed)
Pt presents to the ED with weakness and found to have a hemoglobin of 6.7 last week and black stools since Christmas. Pt is pale and weak. Pt states she took an oxycodone prior to arrival.

## 2020-09-29 NOTE — H&P (Addendum)
[facility] Admission History and Physical Service Pager: [phone]  Patient name: Kathryn Hancock Medical record number: [id number] Date of birth: 02-Aug-1950 Age: 70 y.o. Gender: female  Primary Care Provider: Gala Lewandowsky, MD Consultants: Nephrology  Code Status: Full Code  Preferred Emergency Contact: [name], [phone] mobile, [phone] home   Chief Complaint: low hemoglobin   Assessment and Plan: Kathryn Hancock is a 70 y.o. female presenting with anemia . PMH is significant for T2DM, hypertension, CAD s/p CABG1, PAF (Eliquis), AAA, ESRD TThSa and HLD.   Anemia likely due to GI Bleed  Patient presenting with symptomatic anemia. Endorsing generalized weakness and "buckling legs" since AV fistula procedure on 4/4. Patient recommended to be evaluated in ED after having lab work completed at HD showing hgb of 6. Repeat Hgb in ED is 8.2. FOBT positive. Per chart review, had colonoscopy with polypectomy but unclear as to how long ago this procedure occurred. On exam, patient pale appearing with multiple areas of ecchymosis. Multiple hematomas along with vasculopath history likely cause of ongoing anemia that is now related to symptoms of weakness. Consider GI consult to determine if any source of bleeding. Admitted for treatment of symptomatic anemia with transfusion of pRBCs per recommendation from nephrology and observation in setting of recently established ESRD on HD. -admit to medical telemetry, attending Dr. Andria Frames  -consider GI consult AM of 4/22 -1 unit pRBC ordered -monitor CBC - f/u post transfusion CBC  -transfusion threshold 8  -IV protonix 40 mg  -f/u folate -f/u B12  - vital per floor protocol  -PT/OT to evaluate   ESRD on HD Cr 1.59 and GFR 35 on admission. HD sessions Tues, Thurs and Sat since Jan of 2022. Last session was earlier on 4/21, patient reports completing a full session. Per patient's daughter, they were told to hold  eliquis starting 4/22 in preparation for another fistula procedure that appears to be scheduled for 4/25. -touch base with vascular surgery office in am  -nephrology consulted, appreciate of involvement and recs -monitor RFP    Paroxysmal atrial fibrillation, rate controlled Not in atrial fibrillation during our encounter. Home medication includes eliquis 5 mg bid, per daughter patient was instructed to stop eliquis tonight for upcoming vascular procedure of fistula -hold home eliquis given concern for bleed  -cardiac monitoring  -am EKG  - continue home coreg 6.25  Bilateral LE gangrene Noted to have bilateral LE gangrenous area along toes of both feet. Daughter states that this seems to be improving after re-vascularization procedure earlier during the month of April 2022. Sensation is intact in these areas although very tender to light touch on exam.  -consult wound care  -may benefit from discussing with vascular while patient admitted as [place] -patient to continue home pain management with oxycodone 69m    Leukocytosis  WBC 17.6, likely reactive. No signs of infection and vitals stable. -monitor temp -pending UA -pending urine culture  -am CBC  Hyperglycemia in the setting of Type 2 DM Blood glucose 181 on admission, home medications include glimepiride 1 mg bid with instructions to hold if blood sugar <120.  -hold home med -very sensitive sliding scale -monitor CBGs  Elevated LFT Elevated alkaline phosphatase 161. -monitor CMP -consider RUQ ultrasound if alk phos continues to uptrend  Hypertension BP 97/46 on admission, home medications include amlodipine 5 mg and coreg 6.25 mg bid daily.  -hold home amlodipine given concern for hypotension - continue Coreg for rate control   CAD  CABG x1  Left atrial myxoma  History of AAA Patient had left atrial myxoma removed in Dec of 2021. Home medications include aspirin 81 mg daily and atorvastatin 20 mg daily. -hold aspirin  given possible bleed -continue home atorvastatin  -transfusion threshold 8  Tobacco use Smokes 1/2 ppd, down from 2.5 ppd. Declines nicotine patch, states that it is the habit that she likes and that a patch will not help. -nicotine patch if desired -encourage cesstation   Protein calorie malnutrition  Albumin 2.4 and total protein 6 on admission, secondary to poor oral intake. Patient endorsing decreased appetite over the past few weeks. -renal diet -nutrition consult   FEN/GI: renal diet  Prophylaxis: SCDs, active bleeding   Disposition: admit to [place], attending Dr. Andria Frames   History of Present Illness:  Kathryn Hancock is a 70 y.o. female presenting with anemia.   Patient's daughter, [name] is at the bedside and helps to provide hx. Daughter reports that she was told that her mother's hemoglobin was low while in HD today and they were recommended to come to the ED.   They report that the patient was scheduled to have a procedure on Monday and was insturcted to stop taking her Eliquis tomorrow, 4/22 however she did not have eliquis today as she normally does not take meds prior to HD sessions.  Patient has been weak and having a hard time walking from the car to her home. Following her vascular surgery, she has been a lot weaker than normal. She has been using her rolling walker like a wheel chair. Prior to her surgery in Dec, she ambulated independently. After being discharged from [facility], she has used rollator walker.   Patient reports feeling like her legs "have no strength". She reports feeling this weakness anytime throughout the day. Patient needs assistance with getting out of the car. Patient denies using oxygen at home.   She reports having nausea since 2007. Patient able to meet 30 ounce fluid limit despite eating less as of late.   Patient had oxycodone earlier today for feet pain due to gangrene and hip pain.   Patient smokes 1/2 pack per day. She denies drinking alcohol  other than special occasions. She denies recreational drug use.   ED Course Patient was noted to have Hgb 8.2 and reported from HD for hgb of 6. In ED, hgb was 8.2 but nephrology reasoned this is likely concentrated after HD and patient will likely fall below hgb threshold so recommended admission for transfusion. Patient noted to have elevated WBC 17 without signs of infection. Initially was hypotensive but improved systolic measurements from 60-90s to low 100s.    Review Of Systems: Per HPI with the following additions:   Review of Systems  Constitutional: Positive for activity change, appetite change and fatigue.  Eyes: Negative for visual disturbance.  Respiratory: Negative for shortness of breath.        No hemoptysis  Gastrointestinal: Positive for nausea. Negative for blood in stool and vomiting.  Genitourinary: Negative for dysuria and hematuria.  Musculoskeletal: Positive for back pain.  Neurological: Positive for weakness. Negative for dizziness and headaches.       Hard of hearing   Psychiatric/Behavioral: Negative for confusion.     Patient Active Problem List   Diagnosis Date Noted  . Symptomatic anemia 09/29/2020  . Personal history of COVID-19 09/28/2020  . PVD (peripheral vascular disease) ([place]) 09/12/2020  . COVID-19 08/22/2020  . Hyperlipidemia associated with type 2 diabetes mellitus ([place]) 08/05/2020  . Hypokalemia  07/27/2020  . Unspecified abnormal findings in urine 07/26/2020  . Unspecified severe protein-calorie malnutrition ([place]) 07/21/2020  . Encounter for immunization 07/19/2020  . 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[place]) 07/16/2020  . Coagulation defect, unspecified ([place]) 07/16/2020  . Allergy, unspecified, initial encounter 07/15/2020  . Anaphylactic shock, unspecified, initial encounter 07/15/2020  . 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  . Pain, unspecified 07/15/2020  . Pruritus, unspecified 07/15/2020  . Secondary hyperparathyroidism of renal origin ([place])  07/15/2020  . Shortness of breath 07/15/2020  . PAOD (peripheral arterial occlusive disease) ([place]) 07/08/2020  . ESRD needing dialysis ([place]) 07/01/2020  . PAF (paroxysmal atrial fibrillation) ([place]) 07/01/2020  . Anemia of chronic disease 07/01/2020  . Essential hypertension 07/01/2020  . DM (diabetes mellitus), type 2 with renal complications ([place]) 67/59/1638  . Cellulitis of toe 06/05/2020  . S/P CABG x 1 06/02/2020  . Coronary artery disease involving native coronary artery of native heart with unstable angina pectoris ([place]) 06/01/2020  . CKD (chronic kidney disease) stage 3, GFR 30-59 ml/min (HCC) 06/01/2020  . HLD (hyperlipidemia) 06/01/2020  . Dry gangrene ([place]) -> right foot-toes 05/27/2020  . 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 Tobacco abuse 12/10/2019  . AAA (abdominal aortic aneurysm) without rupture ([place]) 12/10/2019    Past Medical History: Past Medical History:  Diagnosis Date  . AAA (abdominal aortic aneurysm) ([place]) 06/2019   Multiple small pseudoaneurysmal projections of the dominant aorta.  Distal abdominal aortic aneurysm 4.5 x 4.7 cm.  Greatest AP dimension of the infrarenal aorta is 4.9.  No evidence of thoracic aortic aneurysm or dissection.  Brief segment of proximal IMA occlusion.  . Anemia   . Coronary artery calcification seen on CAT scan 06/2019  . Coronary artery disease involving native coronary artery of native heart with unstable angina pectoris ([place]) 06/01/2020   [facility] 06/01/2020-preop for atrial myxoma resection -> severe proximal RCA and PDA -> had single-vessel CABG with SVG-PDA along with myxoma resection.  . Depression   . DM (diabetes mellitus), type 2 with renal complications ([place]) 4/66/5993  . Dry gangrene ([place]) -> right foot-toes 05/27/2020  . Dyspnea   . ESRD (end stage renal disease) on dialysis ([place]) 06/2020   Progression of CKDIIIb to ESRD initially related to thromboembolic event from left atrial myxoma; complicated by  perioperative hypotension-; now 1 on HD TU/TH/SAT  . GERD (gastroesophageal reflux disease)   . Heavy smoker (more than 20 cigarettes per day)    ~ 2 ppd; since age 36 (100 pk yr) = has cut down to one half PPD.>  . Hyperlipidemia    Mixed  . Hyperlipidemia associated with type 2 diabetes mellitus ([place]) 08/05/2020  . Hypertension   . 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 = Status post resection December 23,2021 (done at [facility] because of no bed availability at [facility]  . Microscopic hematuria   . PAF (paroxysmal atrial fibrillation) ([place]) 07/01/2020   Initially noted postoperatively-left atrial myxoma resection and CABG x1.  Now on amiodarone and apixaban..  . Peripheral vascular disease ([place])   . Plantar wart of right foot   . PONV (postoperative nausea and vomiting)     Past Surgical History: Past Surgical History:  Procedure Laterality Date  . ABDOMINAL AORTOGRAM W/LOWER EXTREMITY N/A 07/08/2020   Procedure: ABDOMINAL AORTOGRAM W/LOWER EXTREMITY;  Surgeon: Angelia Mould, MD;  Location: [place] [facility];  Service: Cardiovascular;  Laterality: N/A;  . AV  FISTULA PLACEMENT Left 07/11/2020   Procedure: LEFT FIRST STAGE BRACHIOBASILIC UPPER EXTREMITY ARTERIOVENOUS (AV) FISTULA CREATION;  Surgeon: [name], MD;  Location: [place];  Service: Vascular;  Laterality: Left;  . Cardiac MRI  01/22/2020   Normal LV size and function.  Moderate focal basal septal hypertrophy.  No S.A.M.  LVEF 61%.  RVEF 66%.  Large mobile mass in the left atrium attached to the interatrial septum-does not appear to thrombus characteristics consistent with left atrial myxoma.  . Chest CTA  06/2019   Multiple small pseudoaneurysmal projections of the dominant aorta.  Distal abdominal aortic aneurysm 4.5 x 4.7 cm.  Greatest AP dimension of the infrarenal aorta is 4.9.  No evidence of thoracic aortic aneurysm or  dissection.  Brief segment of proximal IMA occlusion.  Large geographic filling defect of the left atrium with associated dense radiopaque material.  Aortic atherosclerosis and emphysema  . COLONOSCOPY W/ POLYPECTOMY    . CORONARY ARTERY BYPASS GRAFT     05/2020 at [facility]  . EYE SURGERY Bilateral    Cataract surgery  . FEMORAL-POPLITEAL BYPASS GRAFT Left 09/12/2020   Procedure: LEFT FEMORAL-ABOVE KNEE POPLITEAL ARTERY BYPASS GRAFT;  Surgeon: Angelia Mould, MD;  Location: [place];  Service: Vascular;  Laterality: Left;  . IR FLUORO GUIDE CV LINE RIGHT  07/06/2020  . IR US GUIDE VASC ACCESS RIGHT  07/06/2020  . TRANSTHORACIC ECHOCARDIOGRAM  07/2019    EF 60 to 65%.  GR 1 DD.  Elevated LVEDP.  Large size ill-defined echodensity in the left atrium suspicious for thrombus versus tumor.  Moderately dilated LA.  . TUBAL LIGATION      Social History: Social History   Tobacco Use  . Smoking status: Current Every Day Smoker    Packs/day: 0.50    Years: 50.00    Pack years: 25.00    Types: Cigarettes  . Smokeless tobacco: Never Used  Vaping Use  . Vaping Use: Never used  Substance Use Topics  . Alcohol use: Not Currently  . Drug use: Never   Additional social history: daughter catherine and grandson live with the patient  Please also refer to relevant sections of EMR.  Family History: Family History  Problem Relation Age of Onset  . Diabetes Mother   . Diabetes Father   . Heart attack Father 31  . CAD Father   . Hyperlipidemia Father   . Hypertension Father      Allergies and Medications: Allergies  Allergen Reactions  . Penicillins Other (See Comments)    Childhood reaction.   No current facility-administered medications on file prior to encounter.   Current Outpatient Medications on File Prior to Encounter  Medication Sig Dispense Refill  . acetaminophen (TYLENOL) 500 MG tablet Take 500-1,000 mg by mouth every 6 (six) hours as needed for moderate pain.    Marland Kitchen amiodarone  (PACERONE) 200 MG tablet Take 200 mg by mouth daily.    Marland Kitchen amLODipine (NORVASC) 5 MG tablet Take 5 mg by mouth daily.    Marland Kitchen apixaban (ELIQUIS) 5 MG TABS tablet Take 1 tablet (5 mg total) by mouth 2 (two) times daily. 60 tablet 6  . Ascorbic Acid (VITAMIN C) 1000 MG tablet Take 1,000 mg by mouth every evening.    Marland Kitchen aspirin EC 81 MG tablet Take 81 mg by mouth daily. Swallow whole.    Marland Kitchen atorvastatin (LIPITOR) 20 MG tablet Take 20 mg by mouth daily.    . carvedilol (COREG) 6.25 MG tablet TAKE 1  TABLET (6.25 MG TOTAL) BY MOUTH TWO TIMES DAILY WITH A MEAL. (Patient taking differently: Take 6.25 mg by mouth 2 (two) times daily with a meal.) 60 tablet 0  . docusate sodium (COLACE) 100 MG capsule Take 100 mg by mouth daily as needed for mild constipation.    . famotidine (PEPCID) 20 MG tablet Take 20 mg by mouth daily as needed for headache.    . fluticasone (FLONASE) 50 MCG/ACT nasal spray Place 2 sprays into both nostrils as needed for allergies or rhinitis.    . folic acid (FOLVITE) 166 MCG tablet Take 800 mcg by mouth daily.    Marland Kitchen glimepiride (AMARYL) 1 MG tablet Take 1 mg by mouth 2 (two) times daily. Hold if blood sugar is less than 120    . glucose blood test strip 1 each by Other route as needed. Use as instructed    . lidocaine-prilocaine (EMLA) cream Apply 1 application topically daily as needed (dialysis).    Marland Kitchen loperamide (IMODIUM A-D) 2 MG tablet Take 2 mg by mouth 4 (four) times daily as needed for diarrhea or loose stools.    . Methoxy PEG-Epoetin Beta (MIRCERA IJ) Mircera    . montelukast (SINGULAIR) 10 MG tablet Take 10 mg by mouth daily as needed (allergies).    Marland Kitchen oxyCODONE (OXY IR/ROXICODONE) 5 MG immediate release tablet Take 5-10 mg by mouth 2 (two) times daily as needed for moderate pain.    . Vitamin D3 (VITAMIN D) 25 MCG tablet Take 2,000 Units by mouth every evening.      Objective: BP (!) 114/47 (BP Location: Right Arm)   Pulse 73   Temp 98 F (36.7 C) (Oral)   Resp 15   SpO2  100%   Exam: General: Patient laying comfortably in bed, pale appearing, in no acute distress. Eyes: PERRLA, sclera white Neck: supple, no evidence of lymphadenopathy Cardiovascular: RRR, no murmurs or gallops auscultated  Respiratory: CTAB, no wheezing, rales or rhonchi noted  Gastrointestinal: soft, nontender, nondistended, BS+ Ext: radial and distal pulses strong and equal bilaterally, 1+ pedal edema bilaterally, gangrene noted along all toes bilaterally, significant tenderness on light palpation  Derm: hematoma noted along right thigh with eschar-appearing noted on the LE bilaterally without drainage or active bleeding, similar findings noted along left arm as well  Neuro: AOx4, appropriately conversational, bilateral hearing loss noted, 3/5 LE strength noted bilaterally, gross sensation intact bilaterally  Psych: mood appropriate           Labs and Imaging: CBC BMET  Recent Labs  Lab 09/29/20 1242  WBC 17.6*  HGB 8.2*  HCT 27.9*  PLT 296   Recent Labs  Lab 09/29/20 1351  NA 135  K 4.3  CL 98  CO2 27  BUN 10  CREATININE 1.59*  GLUCOSE 181*  CALCIUM 8.1*       DG Chest 1 View  Result Date: 09/29/2020 CLINICAL DATA:  Weakness. Dialysis patient. EXAM: CHEST  1 VIEW COMPARISON:  07/01/2020 FINDINGS: Normal sized heart. Stable post CABG changes. Interval right jugular double-lumen catheter with the lumen tips in the superior vena cava. Clear lungs with normal vascularity. Unremarkable bones. IMPRESSION: No acute abnormality. Electronically Signed   By: Claudie Revering M.D.   On: 09/29/2020 13:31    Donney Dice, DO 09/29/2020, 5:51 PM PGY-1, [place] Intern pager: [phone], text pages welcome   FPTS Upper-Level Resident Addendum   I have independently interviewed and examined the patient. I have discussed the above  with the original author and agree with their documentation. My edits for correction/addition/clarification are included  above. Please see any attending notes.   Eulis Foster, MD PGY-2, [facility] 09/29/2020 10:27 PM  [place] Service pager: [phone] (text pages welcome through [place])

## 2020-09-29 NOTE — Consult Note (Signed)
[facility] Renal Consultation Note    Indication for Consultation:  Management of ESRD/hemodialysis, anemia, hypertension/volume, and secondary hyperparathyroidism.  HPI: Kathryn Hancock is a 70 y.o. female with PMH including ESRD on dialysis since January, AAA, CAD, T2DM, PAD, and HTN who was sent to the ED today after dialysis for symptomatic anemia. Her dialysis center checked her hemoglobin on 09/22/20 and it was 6.8. She received an extra dose of mircera (147mcg total on 4/12). She was seen by the rounding MD at dialysis today and reported weakness and black stools, and was advised to go to the ED after dialysis. Pt reports she has had dark stools since January which are unchanged. She does endorse worsening fatigue lately. Denies SOB, orthopnea, dizziness, abdominal pain, nausea and vomiting. She does report multiple wounds on her hips/buttocks that cause discomfort. She does report increased lower extremity edema and has not been reaching her dry weight outpatient due to hypotension. Reports she is scheduled for a 2nd stage AVF surgery with VVS on 10/03/20. Recent admission for PAD s/p L femoral popliteal artery bypass by VVS and was discharged from the hospital on 09/14/20. She also had COVID 19 prior to this and had some diarrhea but otherwise had minimal symptoms. ED course notable for K+ 4.3, BUN 10, Cr 1.59, Alb 2.4, WBC 17.6, Hgb 8.2, Plt 296. FOBT positive. CXR with no active disease.   Past Medical History:  Diagnosis Date  . AAA (abdominal aortic aneurysm) ([place]) 06/2019   Multiple small pseudoaneurysmal projections of the dominant aorta.  Distal abdominal aortic aneurysm 4.5 x 4.7 cm.  Greatest AP dimension of the infrarenal aorta is 4.9.  No evidence of thoracic aortic aneurysm or dissection.  Brief segment of proximal IMA occlusion.  . Anemia   . Coronary artery calcification seen on CAT scan 06/2019  . Coronary artery disease involving native coronary artery of native heart  with unstable angina pectoris ([place]) 06/01/2020   Cardiac [facility] at [facility] 06/01/2020-preop for atrial myxoma resection -> severe proximal RCA and PDA -> had single-vessel CABG with SVG-PDA along with myxoma resection.  . Depression   . DM (diabetes mellitus), type 2 with renal complications ([place]) 10/05/621  . 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 Dyspnea   . 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 GERD (gastroesophageal reflux disease)   . Heavy smoker (more than 20 cigarettes per day)    ~ 2 ppd; since age 77 (100 pk yr) = has cut down to one half PPD.>  . Hyperlipidemia    Mixed  . Hyperlipidemia associated with type 2 diabetes mellitus ([place]) 08/05/2020  . Hypertension   . 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 = Status post resection December 23,2021 (done at [facility] because of no bed availability at [facility]  . Microscopic hematuria   . PAF (paroxysmal atrial fibrillation) ([place]) 07/01/2020   Initially noted postoperatively-left atrial myxoma resection and CABG x1.  Now on amiodarone and apixaban..  . Peripheral vascular disease ([place])   . Plantar wart of right foot   . PONV (postoperative nausea and vomiting)    Past Surgical History:  Procedure Laterality Date  . ABDOMINAL AORTOGRAM W/LOWER EXTREMITY N/A 07/08/2020   Procedure: ABDOMINAL AORTOGRAM W/LOWER EXTREMITY;  Surgeon: Scot Dock,  Judeth Cornfield, MD;  Location: [facility];  Service: Cardiovascular;  Laterality: N/A;  . AV FISTULA PLACEMENT Left 07/11/2020   Procedure: LEFT FIRST STAGE BRACHIOBASILIC UPPER EXTREMITY ARTERIOVENOUS (AV) FISTULA CREATION;  Surgeon: [name], MD;  Location: [facility];  Service: Vascular;  Laterality: Left;  .  Cardiac MRI  01/22/2020   Normal LV size and function.  Moderate focal basal septal hypertrophy.  No S.A.M.  LVEF 61%.  RVEF 66%.  Large mobile mass in the left atrium attached to the interatrial septum-does not appear to thrombus characteristics consistent with left atrial myxoma.  . Chest CTA  06/2019   Multiple small pseudoaneurysmal projections of the dominant aorta.  Distal abdominal aortic aneurysm 4.5 x 4.7 cm.  Greatest AP dimension of the infrarenal aorta is 4.9.  No evidence of thoracic aortic aneurysm or dissection.  Brief segment of proximal IMA occlusion.  Large geographic filling defect of the left atrium with associated dense radiopaque material.  Aortic atherosclerosis and emphysema  . COLONOSCOPY W/ POLYPECTOMY    . CORONARY ARTERY BYPASS GRAFT     05/2020 at [facility]  . EYE SURGERY Bilateral    Cataract surgery  . FEMORAL-POPLITEAL BYPASS GRAFT Left 09/12/2020   Procedure: LEFT FEMORAL-ABOVE KNEE POPLITEAL ARTERY BYPASS GRAFT;  Surgeon: Angelia Mould, MD;  Location: [facility];  Service: Vascular;  Laterality: Left;  . IR FLUORO GUIDE CV LINE RIGHT  07/06/2020  . IR US GUIDE VASC ACCESS RIGHT  07/06/2020  . TRANSTHORACIC ECHOCARDIOGRAM  07/2019    EF 60 to 65%.  GR 1 DD.  Elevated LVEDP.  Large size ill-defined echodensity in the left atrium suspicious for thrombus versus tumor.  Moderately dilated LA.  . TUBAL LIGATION     Family History  Problem Relation Age of Onset  . Diabetes Mother   . Diabetes Father   . Heart attack Father 29  . CAD Father   . Hyperlipidemia Father   . Hypertension Father    Social History:  reports that she has been smoking cigarettes. She has a 25.00 pack-year smoking history. She has never used smokeless tobacco. She reports previous alcohol use. She reports that she does not use drugs.  ROS: As per HPI otherwise negative.  Physical Exam: Vitals:   09/29/20 1238 09/29/20 1400 09/29/20 1515  BP: (!) 97/46 (!) 97/36 (!) 100/33  Pulse: 80 71  70  Resp: 16 15 17   Temp: 98 F (36.7 C)    TempSrc: Oral    SpO2: 100% 100% 93%     General: Well developed, well nourished, in no acute distress. Head: Normocephalic, atraumatic, sclera non-icteric, mucus membranes are moist. Neck: JVD not elevated. Lungs: Clear bilaterally to auscultation without wheezes, rales, or rhonchi. Breathing is unlabored. Heart: RRR with normal S1, S2. No murmurs, rubs, or gallops appreciated. Abdomen: Soft, non-tender, non-distended with normoactive bowel sounds. No rebound/guarding. No obvious abdominal masses. Musculoskeletal:  Strength and tone appear normal for age. Lower extremities: dry gangrene noted toes, 1+ pitting edema bilateral lower extremities Neuro: Alert and oriented X 3. Moves all extremities spontaneously. Psych:  Responds to questions appropriately with a normal affect. Dialysis Access: R IJ TDC, LUE AVF maturing, + bruit  Allergies  Allergen Reactions  . Penicillins Other (See Comments)    Childhood reaction.   Prior to Admission medications   Medication Sig Start Date End Date Taking? Authorizing Provider  acetaminophen (TYLENOL) 500 MG tablet Take 500-1,000 mg by mouth every 6 (six) hours as  needed for moderate pain.   Yes [provider]  amiodarone (PACERONE) 200 MG tablet Take 200 mg by mouth daily. 06/10/20 06/10/21 Yes [provider]  amLODipine (NORVASC) 5 MG tablet Take 5 mg by mouth daily. 06/10/20 06/10/21 Yes [provider]  apixaban (ELIQUIS) 5 MG TABS tablet Take 1 tablet (5 mg total) by mouth 2 (two) times daily. 12/10/19  Yes Leonie Man, MD  Ascorbic Acid (VITAMIN C) 1000 MG tablet Take 1,000 mg by mouth every evening.   Yes [provider]  aspirin EC 81 MG tablet Take 81 mg by mouth daily. Swallow whole.   Yes [provider]  atorvastatin (LIPITOR) 20 MG tablet Take 20 mg by mouth daily.   Yes [provider]  carvedilol (COREG) 6.25 MG tablet TAKE 1 TABLET  (6.25 MG TOTAL) BY MOUTH TWO TIMES DAILY WITH A MEAL. Patient taking differently: Take 6.25 mg by mouth 2 (two) times daily with a meal. 07/15/20 07/15/21 Yes Pahwani, Einar Grad, MD  docusate sodium (COLACE) 100 MG capsule Take 100 mg by mouth daily as needed for mild constipation.   Yes [provider]  famotidine (PEPCID) 20 MG tablet Take 20 mg by mouth daily as needed for headache.   Yes [provider]  fluticasone (FLONASE) 50 MCG/ACT nasal spray Place 2 sprays into both nostrils as needed for allergies or rhinitis.   Yes [provider]  folic acid (FOLVITE) 456 MCG tablet Take 800 mcg by mouth daily.   Yes [provider]  glimepiride (AMARYL) 1 MG tablet Take 1 mg by mouth 2 (two) times daily. Hold if blood sugar is less than 120   Yes [provider]  glucose blood test strip 1 each by Other route as needed. Use as instructed   Yes [provider]  lidocaine-prilocaine (EMLA) cream Apply 1 application topically daily as needed (dialysis). 08/12/20  Yes [provider]  loperamide (IMODIUM A-D) 2 MG tablet Take 2 mg by mouth 4 (four) times daily as needed for diarrhea or loose stools.   Yes [provider]  Methoxy PEG-Epoetin Beta (MIRCERA IJ) Mircera 09/20/20  Yes [provider]  montelukast (SINGULAIR) 10 MG tablet Take 10 mg by mouth daily as needed (allergies).   Yes [provider]  oxyCODONE (OXY IR/ROXICODONE) 5 MG immediate release tablet Take 5-10 mg by mouth 2 (two) times daily as needed for moderate pain. 06/09/20  Yes [provider]  Vitamin D3 (VITAMIN D) 25 MCG tablet Take 2,000 Units by mouth every evening.   Yes [provider]   Current Facility-Administered Medications  Medication Dose Route Frequency Provider Last Rate Last Admin  . 0.9 %  sodium chloride infusion  10 mL/hr Intravenous Once Fredia Sorrow, MD       Current Outpatient Medications  Medication Sig Dispense  Refill  . acetaminophen (TYLENOL) 500 MG tablet Take 500-1,000 mg by mouth every 6 (six) hours as needed for moderate pain.    Marland Kitchen amiodarone (PACERONE) 200 MG tablet Take 200 mg by mouth daily.    Marland Kitchen amLODipine (NORVASC) 5 MG tablet Take 5 mg by mouth daily.    Marland Kitchen apixaban (ELIQUIS) 5 MG TABS tablet Take 1 tablet (5 mg total) by mouth 2 (two) times daily. 60 tablet 6  . Ascorbic Acid (VITAMIN C) 1000 MG tablet Take 1,000 mg by mouth every evening.    Marland Kitchen aspirin EC 81 MG tablet Take 81 mg by mouth daily. Swallow whole.    Marland Kitchen  atorvastatin (LIPITOR) 20 MG tablet Take 20 mg by mouth daily.    . carvedilol (COREG) 6.25 MG tablet TAKE 1 TABLET (6.25 MG TOTAL) BY MOUTH TWO TIMES DAILY WITH A MEAL. (Patient taking differently: Take 6.25 mg by mouth 2 (two) times daily with a meal.) 60 tablet 0  . docusate sodium (COLACE) 100 MG capsule Take 100 mg by mouth daily as needed for mild constipation.    . famotidine (PEPCID) 20 MG tablet Take 20 mg by mouth daily as needed for headache.    . fluticasone (FLONASE) 50 MCG/ACT nasal spray Place 2 sprays into both nostrils as needed for allergies or rhinitis.    . folic acid (FOLVITE) 706 MCG tablet Take 800 mcg by mouth daily.    Marland Kitchen glimepiride (AMARYL) 1 MG tablet Take 1 mg by mouth 2 (two) times daily. Hold if blood sugar is less than 120    . glucose blood test strip 1 each by Other route as needed. Use as instructed    . lidocaine-prilocaine (EMLA) cream Apply 1 application topically daily as needed (dialysis).    Marland Kitchen loperamide (IMODIUM A-D) 2 MG tablet Take 2 mg by mouth 4 (four) times daily as needed for diarrhea or loose stools.    . Methoxy PEG-Epoetin Beta (MIRCERA IJ) Mircera    . montelukast (SINGULAIR) 10 MG tablet Take 10 mg by mouth daily as needed (allergies).    Marland Kitchen oxyCODONE (OXY IR/ROXICODONE) 5 MG immediate release tablet Take 5-10 mg by mouth 2 (two) times daily as needed for moderate pain.    . Vitamin D3 (VITAMIN D) 25 MCG tablet Take 2,000 Units by  mouth every evening.     Labs: Basic Metabolic Panel: Recent Labs  Lab 09/29/20 1351  NA 135  K 4.3  CL 98  CO2 27  GLUCOSE 181*  BUN 10  CREATININE 1.59*  CALCIUM 8.1*   Liver Function Tests: Recent Labs  Lab 09/29/20 1351  AST 15  ALT 13  ALKPHOS 169*  BILITOT 0.6  PROT 6.0*  ALBUMIN 2.4*   CBC: Recent Labs  Lab 09/29/20 1242  WBC 17.6*  NEUTROABS 14.7*  HGB 8.2*  HCT 27.9*  MCV 112.5*  PLT 296   Studies/Results: DG Chest 1 View  Result Date: 09/29/2020 CLINICAL DATA:  Weakness. Dialysis patient. EXAM: CHEST  1 VIEW COMPARISON:  07/01/2020 FINDINGS: Normal sized heart. Stable post CABG changes. Interval right jugular double-lumen catheter with the lumen tips in the superior vena cava. Clear lungs with normal vascularity. Unremarkable bones. IMPRESSION: No acute abnormality. Electronically Signed   By: Claudie Revering M.D.   On: 09/29/2020 13:31    Outpatient Dialysis Orders:  Center: [facility]  on TTS. 3 hrs 45 min, 180NRe Optiflux, BFR 400, DFR Manual 500 mL/min, EDW 59 (kg), Dialysate 3.0 K, 2.5 Ca, Received mircera 100 mcg on 09/20/20 No heparin, VDRA or binder  Assessment/Plan: 1.  Symptomatic anemia: Pt presenting with weakness and dark stools, Hgb 6.8 on 4/14 at outpatient unit and 8.2 in the ED today. FOBT positive- work up per primary team. Recently received higher dose of ESA, not due for redose yet. Hx of CAD, transfuse PRN for Hgb <8.   2.  ESRD:  Dialyzes on TTS schedule, last HD today. Slightly volume overloaded but no respiratory distress, no indication for further HD today, will plan for next dialysis on Friday. Planned for second stage AVF surgery on 4/25 if stable at that time.  3.  Hypertension/volume: BP  soft, not reaching EDW outpatient due to hypotension. + pedal edema. Hold home BP meds for now, will attempt UF with HD as tolerated. 4.  Metabolic bone disease: Calcium controlled. Not on VDRA or phosphorus binders, follow labs.   5.  Nutrition:  Renal diet/fluid restrictions recommended.  6. PAD: gangrene bilateral lower extremities, recent bypass by VVS.  7. Wounds: Pt reports painful wounds on hips/buttocks. Reports pain improved with home pain med regimen. Management per admitting team.   Anice Paganini, PA-C 09/29/2020, 3:29 PM  [facility] Pager: [phone]

## 2020-09-29 NOTE — ED Triage Notes (Signed)
Emergency Medicine Provider Triage Evaluation Note  Kathryn Hancock , a 70 y.o. female  was evaluated in triage.  Pt complains of generalized weakness.  Patient attends dialysis Tuesday, Thursday, Saturday, did attend full dialysis today.  Has paperwork with her reporting feeling more tired and symptomatic.  Had hemoglobin of 6.8 1-week ago, is on aspirin and Eliquis, reports having black stools since Christmas states she has seen so many doctors she is not sure if she has been seen for this.  Review of Systems  Positive: Weakness, dark stools, pale Negative: CP  Physical Exam  There were no vitals taken for this visit. Gen:   Awake, no distress, pale HEENT:  Atraumatic  Resp:  Normal effort  Cardiac:  Normal rate  Abd:   Nondistended, nontender  MSK:   Moves extremities without difficulty  Neuro:  Speech clear   Medical Decision Making  Medically screening exam initiated at 12:35 PM.  Appropriate orders placed.  Kathryn Hancock was informed that the remainder of the evaluation will be completed by another provider, this initial triage assessment does not replace that evaluation, and the importance of remaining in the ED until their evaluation is complete.  Clinical Impression     Roque Lias 09/29/20 1241

## 2020-09-30 DIAGNOSIS — R531 Weakness: Secondary | ICD-10-CM

## 2020-09-30 DIAGNOSIS — E46 Unspecified protein-calorie malnutrition: Secondary | ICD-10-CM | POA: Diagnosis present

## 2020-09-30 DIAGNOSIS — K2970 Gastritis, unspecified, without bleeding: Secondary | ICD-10-CM | POA: Diagnosis not present

## 2020-09-30 DIAGNOSIS — D649 Anemia, unspecified: Secondary | ICD-10-CM | POA: Diagnosis not present

## 2020-09-30 DIAGNOSIS — K5521 Angiodysplasia of colon with hemorrhage: Secondary | ICD-10-CM | POA: Diagnosis present

## 2020-09-30 DIAGNOSIS — I714 Abdominal aortic aneurysm, without rupture: Secondary | ICD-10-CM | POA: Diagnosis present

## 2020-09-30 DIAGNOSIS — Z8616 Personal history of COVID-19: Secondary | ICD-10-CM | POA: Diagnosis not present

## 2020-09-30 DIAGNOSIS — D72829 Elevated white blood cell count, unspecified: Secondary | ICD-10-CM | POA: Diagnosis present

## 2020-09-30 DIAGNOSIS — I480 Paroxysmal atrial fibrillation: Secondary | ICD-10-CM | POA: Diagnosis present

## 2020-09-30 DIAGNOSIS — I120 Hypertensive chronic kidney disease with stage 5 chronic kidney disease or end stage renal disease: Secondary | ICD-10-CM | POA: Diagnosis present

## 2020-09-30 DIAGNOSIS — E1165 Type 2 diabetes mellitus with hyperglycemia: Secondary | ICD-10-CM | POA: Diagnosis present

## 2020-09-30 DIAGNOSIS — I2510 Atherosclerotic heart disease of native coronary artery without angina pectoris: Secondary | ICD-10-CM | POA: Diagnosis present

## 2020-09-30 DIAGNOSIS — K5711 Diverticulosis of small intestine without perforation or abscess with bleeding: Secondary | ICD-10-CM | POA: Diagnosis present

## 2020-09-30 DIAGNOSIS — Z951 Presence of aortocoronary bypass graft: Secondary | ICD-10-CM | POA: Diagnosis not present

## 2020-09-30 DIAGNOSIS — E782 Mixed hyperlipidemia: Secondary | ICD-10-CM | POA: Diagnosis present

## 2020-09-30 DIAGNOSIS — K922 Gastrointestinal hemorrhage, unspecified: Secondary | ICD-10-CM | POA: Diagnosis not present

## 2020-09-30 DIAGNOSIS — K2961 Other gastritis with bleeding: Secondary | ICD-10-CM | POA: Diagnosis present

## 2020-09-30 DIAGNOSIS — Z992 Dependence on renal dialysis: Secondary | ICD-10-CM | POA: Diagnosis not present

## 2020-09-30 DIAGNOSIS — I739 Peripheral vascular disease, unspecified: Secondary | ICD-10-CM | POA: Diagnosis not present

## 2020-09-30 DIAGNOSIS — F17210 Nicotine dependence, cigarettes, uncomplicated: Secondary | ICD-10-CM | POA: Diagnosis present

## 2020-09-30 DIAGNOSIS — Z7901 Long term (current) use of anticoagulants: Secondary | ICD-10-CM | POA: Diagnosis not present

## 2020-09-30 DIAGNOSIS — Z20822 Contact with and (suspected) exposure to covid-19: Secondary | ICD-10-CM | POA: Diagnosis present

## 2020-09-30 DIAGNOSIS — N185 Chronic kidney disease, stage 5: Secondary | ICD-10-CM | POA: Diagnosis not present

## 2020-09-30 DIAGNOSIS — N186 End stage renal disease: Secondary | ICD-10-CM | POA: Diagnosis present

## 2020-09-30 DIAGNOSIS — E1122 Type 2 diabetes mellitus with diabetic chronic kidney disease: Secondary | ICD-10-CM | POA: Diagnosis present

## 2020-09-30 DIAGNOSIS — E871 Hypo-osmolality and hyponatremia: Secondary | ICD-10-CM | POA: Diagnosis present

## 2020-09-30 DIAGNOSIS — K31819 Angiodysplasia of stomach and duodenum without bleeding: Secondary | ICD-10-CM | POA: Diagnosis not present

## 2020-09-30 DIAGNOSIS — E440 Moderate protein-calorie malnutrition: Secondary | ICD-10-CM | POA: Insufficient documentation

## 2020-09-30 DIAGNOSIS — K921 Melena: Secondary | ICD-10-CM | POA: Diagnosis not present

## 2020-09-30 DIAGNOSIS — D500 Iron deficiency anemia secondary to blood loss (chronic): Secondary | ICD-10-CM | POA: Diagnosis present

## 2020-09-30 DIAGNOSIS — E1152 Type 2 diabetes mellitus with diabetic peripheral angiopathy with gangrene: Secondary | ICD-10-CM | POA: Diagnosis present

## 2020-09-30 DIAGNOSIS — N2581 Secondary hyperparathyroidism of renal origin: Secondary | ICD-10-CM | POA: Diagnosis present

## 2020-09-30 DIAGNOSIS — R7989 Other specified abnormal findings of blood chemistry: Secondary | ICD-10-CM | POA: Diagnosis present

## 2020-09-30 DIAGNOSIS — I10 Essential (primary) hypertension: Secondary | ICD-10-CM | POA: Diagnosis not present

## 2020-09-30 LAB — RENAL FUNCTION PANEL
Albumin: 1.9 g/dL — ABNORMAL LOW (ref 3.5–5.0)
Anion gap: 9 (ref 5–15)
BUN: 24 mg/dL — ABNORMAL HIGH (ref 8–23)
CO2: 28 mmol/L (ref 22–32)
Calcium: 7.8 mg/dL — ABNORMAL LOW (ref 8.9–10.3)
Chloride: 96 mmol/L — ABNORMAL LOW (ref 98–111)
Creatinine, Ser: 2.49 mg/dL — ABNORMAL HIGH (ref 0.44–1.00)
GFR, Estimated: 21 mL/min — ABNORMAL LOW (ref >60)
Glucose, Bld: 100 mg/dL — ABNORMAL HIGH (ref 70–99)
Phosphorus: 3.1 mg/dL (ref 2.5–4.6)
Potassium: 4.4 mmol/L (ref 3.5–5.1)
Sodium: 133 mmol/L — ABNORMAL LOW (ref 135–145)

## 2020-09-30 LAB — URINALYSIS, ROUTINE W REFLEX MICROSCOPIC
Bilirubin Urine: NEGATIVE
Glucose, UA: NEGATIVE mg/dL
Hgb urine dipstick: NEGATIVE
Ketones, ur: NEGATIVE mg/dL
Nitrite: NEGATIVE
Protein, ur: 30 mg/dL — AB
Specific Gravity, Urine: 1.012 (ref 1.005–1.030)
WBC, UA: >50 WBC/hpf — ABNORMAL HIGH (ref 0–5)
pH: 8 (ref 5.0–8.0)

## 2020-09-30 LAB — CBC
HCT: 20.2 % — ABNORMAL LOW (ref 36.0–46.0)
Hemoglobin: 6.4 g/dL — CL (ref 12.0–15.0)
MCH: 32.7 pg (ref 26.0–34.0)
MCHC: 31.7 g/dL (ref 30.0–36.0)
MCV: 103.1 fL — ABNORMAL HIGH (ref 80.0–100.0)
Platelets: 230 10*3/uL (ref 150–400)
RBC: 1.96 MIL/uL — ABNORMAL LOW (ref 3.87–5.11)
RDW: 22.5 % — ABNORMAL HIGH (ref 11.5–15.5)
WBC: 12.5 10*3/uL — ABNORMAL HIGH (ref 4.0–10.5)
nRBC: 0 % (ref 0.0–0.2)

## 2020-09-30 LAB — FOLATE: Folate: 23.2 ng/mL (ref >5.9)

## 2020-09-30 LAB — HEPATIC FUNCTION PANEL
ALT: 10 U/L (ref 0–44)
AST: 12 U/L — ABNORMAL LOW (ref 15–41)
Albumin: 1.9 g/dL — ABNORMAL LOW (ref 3.5–5.0)
Alkaline Phosphatase: 128 U/L — ABNORMAL HIGH (ref 38–126)
Bilirubin, Direct: 0.1 mg/dL (ref 0.0–0.2)
Indirect Bilirubin: 0.4 mg/dL (ref 0.3–0.9)
Total Bilirubin: 0.5 mg/dL (ref 0.3–1.2)
Total Protein: 4.7 g/dL — ABNORMAL LOW (ref 6.5–8.1)

## 2020-09-30 LAB — IRON AND TIBC
Iron: 46 ug/dL (ref 28–170)
Saturation Ratios: 20 % (ref 10.4–31.8)
TIBC: 231 ug/dL — ABNORMAL LOW (ref 250–450)
UIBC: 185 ug/dL

## 2020-09-30 LAB — GLUCOSE, CAPILLARY
Glucose-Capillary: 113 mg/dL — ABNORMAL HIGH (ref 70–99)
Glucose-Capillary: 127 mg/dL — ABNORMAL HIGH (ref 70–99)
Glucose-Capillary: 98 mg/dL (ref 70–99)
Glucose-Capillary: 142 mg/dL — ABNORMAL HIGH (ref 70–99)

## 2020-09-30 LAB — PROTIME-INR
INR: 1.3 — ABNORMAL HIGH (ref 0.8–1.2)
Prothrombin Time: 16.6 seconds — ABNORMAL HIGH (ref 11.4–15.2)

## 2020-09-30 LAB — PREPARE RBC (CROSSMATCH)

## 2020-09-30 LAB — FERRITIN: Ferritin: 237 ng/mL (ref 11–307)

## 2020-09-30 LAB — HEMOGLOBIN AND HEMATOCRIT, BLOOD
HCT: 24.2 % — ABNORMAL LOW (ref 36.0–46.0)
Hemoglobin: 7.9 g/dL — ABNORMAL LOW (ref 12.0–15.0)

## 2020-09-30 LAB — VITAMIN B12: Vitamin B-12: 233 pg/mL (ref 180–914)

## 2020-09-30 MED ORDER — RENA-VITE PO TABS
1.0000 | ORAL_TABLET | Freq: Every day | ORAL | Status: DC
  Administered 2020-09-30 – 2020-10-02 (×3): 1 via ORAL
  Filled 2020-09-30 (×3): qty 1

## 2020-09-30 MED ORDER — SODIUM CHLORIDE 0.9% IV SOLUTION: Freq: Once | INTRAVENOUS | Status: AC

## 2020-09-30 MED ORDER — PANTOPRAZOLE SODIUM 40 MG PO TBEC
40.0000 mg | DELAYED_RELEASE_TABLET | Freq: Two times a day (BID) | ORAL | Status: DC
  Administered 2020-09-30 – 2020-10-03 (×5): 40 mg via ORAL
  Filled 2020-09-30 (×5): qty 1

## 2020-09-30 MED ORDER — PROSOURCE PLUS PO LIQD
30.0000 mL | Freq: Two times a day (BID) | ORAL | Status: DC
  Administered 2020-10-02 (×2): 30 mL via ORAL
  Filled 2020-09-30 (×2): qty 30

## 2020-09-30 MED ORDER — PHYTONADIONE 5 MG PO TABS
10.0000 mg | ORAL_TABLET | Freq: Once | ORAL | Status: AC
  Administered 2020-09-30: 10 mg via ORAL
  Filled 2020-09-30: qty 2

## 2020-09-30 MED ORDER — SODIUM CHLORIDE 0.9% IV SOLUTION: Freq: Once | INTRAVENOUS | Status: DC

## 2020-09-30 MED ORDER — CHLORHEXIDINE GLUCONATE CLOTH 2 % EX PADS
6.0000 | MEDICATED_PAD | Freq: Every day | CUTANEOUS | Status: DC
  Administered 2020-09-30 – 2020-10-03 (×3): 6 via TOPICAL

## 2020-09-30 MED ORDER — GLUCERNA SHAKE PO LIQD
237.0000 mL | Freq: Two times a day (BID) | ORAL | Status: DC
  Administered 2020-10-02: 237 mL via ORAL

## 2020-09-30 MED ORDER — COLLAGENASE 250 UNIT/GM EX OINT
1.0000 "application " | TOPICAL_OINTMENT | Freq: Every day | CUTANEOUS | Status: DC
  Administered 2020-09-30 – 2020-10-03 (×3): 1 via TOPICAL
  Filled 2020-09-30: qty 30

## 2020-09-30 NOTE — Hospital Course (Addendum)
Kathryn Hancock is a 70 y.o. female presenting with anemia . PMH is significant for T2DM, hypertension, CAD s/p CABG1, PAF (Eliquis), AAA, ESRD TThSa and HLD.   Anemia likely due to GI Bleed  Patient presenting with symptomatic anemia. Endorsing generalized weakness and "buckling legs" since AV fistula procedure on 4/4. Patient recommended to be evaluated in ED after having lab work completed at HD showing hgb of 6. Repeat Hgb in ED is 8.2. FOBT positive. Per chart review, had colonoscopy with polypectomy but unclear as to how long ago this procedure occurred. On exam, patient pale appearing with multiple areas of ecchymosis. Multiple hematomas along with vasculopath history likely cause of ongoing anemia that is now related to symptoms of weakness. Transfused 1 unit pRBC initially. 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 Recommended continued Protonix daily, avoid NSAIDs, and follow up with GI outpatient in 3-4 weeks. Patient received total of 3 units of blood transfused over the course of admission. Hemoglobin on day of discharge 8.6.   ESRD on HD Cr 1.59 and GFR 35 on admission. HD sessions Tues, Thurs and Sat since Jan of 2022. Last session was earlier on 4/21, patient reports completing a full session. Per patient's daughter, they were told to hold eliquis starting 4/22 in preparation for another fistula procedure that appears to be scheduled for 4/25. Underwent VVS procedure 4/25, was successful, tolerated well.   Bilateral LE gangrene Noted to have bilateral LE gangrenous area along toes of both feet. Daughter states that this seems to be improving after re-vascularization procedure earlier during the month of April 2022. Sensation is intact in these areas although very tender to light touch on exam. Wound care consulted at admission. Will recommend she continue to follow with  VVS outpatient.

## 2020-09-30 NOTE — TOC Initial Note (Incomplete)
Transition of Care [facility]) - Initial/Assessment Note    Patient Details  Name: Kathryn Hancock MRN: [id number] Date of Birth: 02/23/1951  [facility] CM/SW Contact:    Sharin Mons, RN Phone Number: 09/30/2020, 2:27 PM  Clinical Narrative:                 Admitted with anemia, hx of DM, hypertension, CAD s/p CABG1, PAF (Eliquis), AAA, ESRD TThSa and HLD. From home alone. Active with [facility] , PT. Home health services confirmed with [name]....  GI following... EGD planned for 4/23  [facility] team following and will assist with [facility] needs  Expected Discharge Plan: [place] Barriers to Discharge: Continued Medical Work up   Patient Goals and CMS Choice Patient states their goals for this hospitalization and ongoing recovery are:: return to home   Choice offered to / list presented to : Patient  Expected Discharge Plan and Services Expected Discharge Plan: [place]   Discharge Planning Services: CM Consult Post Acute Care Choice: Home Health,Resumption of Svcs/PTA Provider Living arrangements for the past 2 months: Single Family Home                           HH Arranged: PT HH Agency: [facility] Date [facility] Agency Contacted: 09/30/20      Prior Living Arrangements/Services Living arrangements for the past 2 months: Single Family Home Lives with:: Self Patient language and need for interpreter reviewed:: Yes Do you feel safe going back to the place where you live?: Yes      Need for Family Participation in Patient Care: Yes (Comment) Care giver support system in place?: Yes (comment) Current home services: Home PT Criminal Activity/Legal Involvement Pertinent to Current Situation/Hospitalization: No - Comment as needed  Activities of Daily Living Home Assistive Devices/Equipment: Walker (specify type),Eyeglasses,Dentures (specify type),CBG Meter ADL Screening (condition at time of  admission) Patient's cognitive ability adequate to safely complete daily activities?: Yes Is the patient deaf or have difficulty hearing?: No Does the patient have difficulty seeing, even when wearing glasses/contacts?: No Does the patient have difficulty concentrating, remembering, or making decisions?: No Patient able to express need for assistance with ADLs?: Yes Does the patient have difficulty dressing or bathing?: No Independently performs ADLs?: Yes (appropriate for developmental age) Does the patient have difficulty walking or climbing stairs?: Yes Weakness of Legs: Both Weakness of Arms/Hands: None  Permission Sought/Granted                  Emotional Assessment Appearance:: Appears stated age     Orientation: : Oriented to Self,Oriented to Place,Oriented to  Time,Oriented to Situation Alcohol / Substance Use: Not Applicable Psych Involvement: No (comment)  Admission diagnosis:  Weakness [R53.1] ESRD on dialysis ([place]) [N18.6, Z99.2] Symptomatic anemia [D64.9] Anemia, unspecified type [D64.9] Patient Active Problem List   Diagnosis Date Noted  . Current use of long term anticoagulation   . Weakness   . Symptomatic anemia 09/29/2020  . Personal history of COVID-19 09/28/2020  . PVD (peripheral vascular disease) ([place]) 09/12/2020  . COVID-19 08/22/2020  . Hyperlipidemia associated with type 2 diabetes mellitus ([place]) 08/05/2020  . Hypokalemia 07/27/2020  . Unspecified abnormal findings in urine 07/26/2020  . Unspecified severe protein-calorie malnutrition ([place]) 07/21/2020  . Encounter for immunization 07/19/2020  . 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[place]) 07/16/2020  . Coagulation defect, unspecified ([place]) 07/16/2020  . Allergy, unspecified, initial  encounter 07/15/2020  . Anaphylactic shock, unspecified, initial encounter 07/15/2020  . 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  . Pain, unspecified 07/15/2020  . Pruritus, unspecified 07/15/2020  . Secondary hyperparathyroidism  of renal origin ([place]) 07/15/2020  . Shortness of breath 07/15/2020  . PAOD (peripheral arterial occlusive disease) ([place]) 07/08/2020  . ESRD on dialysis ([place]) 07/01/2020  . PAF (paroxysmal atrial fibrillation) ([place]) 07/01/2020  . Anemia of chronic disease 07/01/2020  . Essential hypertension 07/01/2020  . DM (diabetes mellitus), type 2 with renal complications ([place]) 09/81/1914  . Cellulitis of toe 06/05/2020  . S/P CABG (coronary artery bypass graft) 06/02/2020  . Coronary artery disease involving native coronary artery of native heart with unstable angina pectoris ([place]) 06/01/2020  . CKD (chronic kidney disease) stage 3, GFR 30-59 ml/min (H[facility]) 06/01/2020  . HLD (hyperlipidemia) 06/01/2020  . 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 Tobacco abuse 12/10/2019  . AAA (abdominal aortic aneurysm) without rupture ([place]) 12/10/2019   PCP:  Gala Lewandowsky, MD Pharmacy:   [facility] [place], [place] - [address] [place] [address] Phone: [phone] Fax: [phone]  [facility] [place], [place] - [address] [place] [address] Phone: [phone] Fax: [phone]  [facility] [address] [place] [address] Phone: [phone] Fax: [phone]     Social Determinants of Health (SDOH) Interventions    Readmission Risk Interventions No flowsheet data found.

## 2020-09-30 NOTE — Progress Notes (Signed)
Inpatient Diabetes Program Recommendations  AACE/ADA: New Consensus Statement on Inpatient Glycemic Control (2015)  Target Ranges:  Prepandial:   less than 140 mg/dL      Peak postprandial:   less than 180 mg/dL (1-2 hours)      Critically ill patients:  140 - 180 mg/dL   Lab Results  Component Value Date   GLUCAP 113 (H) 09/30/2020   HGBA1C 11.1 (H) 09/29/2020    Review of Glycemic Control  Diabetes history: DM 2 Outpatient Diabetes medications: Glimepiride 1 mg BID Current orders for Inpatient glycemic control:  Novolog 0-6 units tid  A1c 11.1% this admission, not accurate due to Hgb levels  Glucose 181 on presentation  Spoke with pt and daughter in room. Pt recieves help with medications from her daughter who lives with her. Daughter reports not being as good at checking glucose everyday lately. She also reports that there are times when glucose is over 200 in the evening and she gives her mom the glimepiride however pt wakes up with hypoglycemia.  Based on renal function, Glimepiride may not be a good  Choice for pt outpatient for glucose control. Daughter reporting poor po intake since surgery in December. Pt is only on correction scale here needing minimal amount of insulin. Daughter reports giving insulin via vial and syringe to her grandparents and knows how to do it. I showed her the insulin pen just in case.  Options for pt at d/c is sliding scale or Tradjenta 5 mg Daily.  Will request a benefits check on Novolog Humalog and Tradjenta.  Pt has medicare insurance and a [employer] transport manager. Daughter reporting pretty good medication coverage in the past.  Thanks,  Tama Headings RN, MSN, BC-ADM Inpatient Diabetes Coordinator Team Pager [phone] (8a-5p)

## 2020-09-30 NOTE — Progress Notes (Addendum)
Post H&H of 7.9.  Per nephrology note will transfuse as needed for hemoglobin threshold of 8.0.  1 unit packed RBCs ordered.  We will follow-up post H&H.

## 2020-09-30 NOTE — Consult Note (Addendum)
[facility] Nurse Consult Note: Reason for Consult: Consult requested for bilat hips.   Wound type: Sacrum with chronic Unstageable pressure injury; 4X1cm, 100% yellow slough, small amt tan drainage, no odor or fluctuance.  Left hip with 2 areas of chronic Unstageable pressure injuries; 100% tightly adhered eschar, no odor, drainage, or fluctuance. 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 Pt has renal failure according to the progress notes and this complex medical condition would need to be diagnosed with a biopsy if desired. Dressing procedure/placement/frequency: Topical treatment orders provided for bedside nurses to perform to assist with removal of nonviable tissue as follows: Apply Santyl to sacrum and bilat hip wounds Q day, then cover with moist gauze dressings and foam dressings.  (Change foam dressings  Q 3 days or PRN) Please re-consult if further assistance is needed.  Thank-you,  Julien Girt MSN, [place], [place], [place], [place]

## 2020-09-30 NOTE — Evaluation (Signed)
Physical Therapy Evaluation Patient Details Name: Kathryn Hancock MRN: [id number] DOB: Sep 29, 1950 Today's Date: 09/30/2020   History of Present Illness  Pt is 70 yo female presenting with symptomatic anemia 09/29/20.  Hgb 6.0 at HD, 8.2 in ED, received unit PRBC, back to 6.4 4/22, received unit PRBC 4/22.  Pt with PMH  significant for T2DM, hypertension, CAD s/p CABG1, PAF (Eliquis), AAA, ESRD TThSa and HLD.  Pt s/p re-vascularization surgery ~2 weeks ago. Bil toes with dry gangrene.  Clinical Impression  Pt admitted with above diagnosis. Pt able to tolerate ambulation in room with min guard but fatigues easily.  She received 1 unit of blood prior to PT evaluation, all VSS during therapy.  Pt has 24 hr support at home and DME.  Prior to past couple weeks, she was independent with adls and ambulated short community distances with rollator.  Expected to progress well with therapy. Pt currently with functional limitations due to the deficits listed below (see PT Problem List). Pt will benefit from skilled PT to increase their independence and safety with mobility to allow discharge to the venue listed below.       Follow Up Recommendations Home health PT;Supervision for mobility/OOB    Equipment Recommendations  None recommended by PT    Recommendations for Other Services       Precautions / Restrictions Precautions Precautions: Fall      Mobility  Bed Mobility Overal bed mobility: Needs Assistance Bed Mobility: Supine to Sit     Supine to sit: Supervision          Transfers Overall transfer level: Needs assistance Equipment used: Rolling walker (2 wheeled) Transfers: Sit to/from Stand Sit to Stand: Min guard         General transfer comment: min guard for safety; performed x 2; also performed toileting ADLS  Ambulation/Gait Ambulation/Gait assistance: Min guard Gait Distance (Feet): 30 Feet Assistive device: Rolling walker (2 wheeled) Gait Pattern/deviations: Step-to  pattern;Decreased stride length     General Gait Details: Fatigued easily; cues for RW proximity and posture  Stairs            Wheelchair Mobility    Modified Rankin (Stroke Patients Only)       Balance Overall balance assessment: Needs assistance Sitting-balance support: No upper extremity supported;Feet supported Sitting balance-Leahy Scale: Good     Standing balance support: No upper extremity supported Standing balance-Leahy Scale: Fair Standing balance comment: RW for ambulation, able to perform toielting ADLs and washing hands without support but reports feeling unsteady                             Pertinent Vitals/Pain Pain Assessment: Faces Faces Pain Scale: Hurts little more Pain Location: toes when touched/donning socks Pain Descriptors / Indicators: Grimacing;Guarding Pain Intervention(s): Limited activity within patient's tolerance;Monitored during session    Home Living Family/patient expects to be discharged to:: Private residence Living Arrangements: Children Available Help at Discharge: Family;Available 24 hours/day Type of Home: House Home Access: Stairs to enter   [employer] of Steps: 1 Home Layout: One level Home Equipment: Tub bench;Hand held shower head;Bedside commode;Walker - 4 wheels      Prior Function Level of Independence: Needs assistance   Gait / Transfers Assistance Needed: Using rollator for ambulation-could ambulate short community distances prior to recent sx  ADL's / Homemaking Assistance Needed: Pt reports she is typically independent with ADLS (sponge bathing) but dtr assist at times; dtr  helps with meds, transportation, IADLs  Comments: Pt had recent vascular surgery ~ 2 weeks ago     Hand Dominance   Dominant Hand: Right    Extremity/Trunk Assessment   Upper Extremity Assessment Upper Extremity Assessment: Generalized weakness    Lower Extremity Assessment RLE Deficits / Details: Bil  toe gangrene baseline; Edema bil feet; Demonstrating 4/5 strength in hip and knees (ankles not tested due to pain) LLE Deficits / Details: Bil toe gangrene baseline; Edema bil feet; Demonstrating 4/5 strength in hip and knees (ankles not tested due to pain)    Cervical / Trunk Assessment Cervical / Trunk Assessment: Kyphotic  Communication   Communication: No difficulties  Cognition Arousal/Alertness: Awake/alert Behavior During Therapy: WFL for tasks assessed/performed Overall Cognitive Status: Within Functional Limits for tasks assessed                                        General Comments General comments (skin integrity, edema, etc.): Pt's hgb 6.4 this am, she received unit PRBC, all VSS during therapy    Exercises     Assessment/Plan    PT Assessment Patient needs continued PT services  PT Problem List Decreased strength;Decreased balance;Decreased activity tolerance;Decreased mobility;Pain;Cardiopulmonary status limiting activity;Decreased knowledge of use of DME       PT Treatment Interventions DME instruction;Gait training;Stair training;Therapeutic activities;Functional mobility training;Therapeutic exercise;Balance training;Patient/family education    PT Goals (Current goals can be found in the Care Plan section)  Acute Rehab PT Goals Patient Stated Goal: to go home PT Goal Formulation: With patient Time For Goal Achievement: 10/14/20 Potential to Achieve Goals: Good    Frequency Min 3X/week   Barriers to discharge        Co-evaluation               AM-PAC PT "6 Clicks" Mobility  Outcome Measure Help needed turning from your back to your side while in a flat bed without using bedrails?: None Help needed moving from lying on your back to sitting on the side of a flat bed without using bedrails?: A Little Help needed moving to and from a bed to a chair (including a wheelchair)?: A Little Help needed standing up from a chair using your  arms (e.g., wheelchair or bedside chair)?: A Little Help needed to walk in hospital room?: A Little Help needed climbing 3-5 steps with a railing? : A Little 6 Click Score: 19    End of Session Equipment Utilized During Treatment: Gait belt Activity Tolerance: Patient limited by fatigue Patient left: with chair alarm set;in chair;with call bell/phone within reach Nurse Communication: Mobility status PT Visit Diagnosis: Other abnormalities of gait and mobility (R26.89);Pain Pain - Right/Left: Left Pain - part of body: Leg;Ankle and joints of foot    Time: 9833-8250 PT Time Calculation (min) (ACUTE ONLY): 28 min   Charges:   PT Evaluation $PT Eval Low Complexity: 1 Low PT Treatments $Gait Training: 8-22 mins        Abran Richard, PT Acute Rehab Services Pager [phone] [facility] 09/30/2020, 1:08 PM

## 2020-09-30 NOTE — H&P (View-Only) (Signed)
Referring Provider:  Family Medicine         Primary Care Physician:  Gala Lewandowsky, MD Primary Gastroenterologist:  Althia Forts           We were asked to see this patient for:   anemia               Attending physician's note   I have taken a history, examined the patient and reviewed the chart. I agree with the Advanced Practitioner's note, impression and recommendations.  70 year old female with end-stage renal disease on hemodialysis, multiple comorbidities admitted with symptomatic anemia and melena. She is on chronic anticoagulation with Eliquis Hemoglobin dropped from 8--->6  Transfuse PRBC to hemoglobin greater than 7 PPI twice daily N.p.o. after midnight  We will plan for EGD for further evaluation and endoscopic intervention if needed The risks and benefits as well as alternatives of endoscopic procedure(s) have been discussed and reviewed. All questions answered. The patient agrees to proceed.    Damaris Hippo , MD [phone]   ASSESSMENT / PLAN:   # 70 yo female with multiple medical problems including ESRD on HD Tu / Th / Sat. Admitted with symptomatic macrocytic anemia, intermittent black stools since December on Eliquis + ASA. Baseline hgb hard to discern as it has ranged between high 8.3  and high 12 for last several months.   In ED yesterday it was 8.2, down to 6.4 today. Cause of 2 gram drop in hgb overnight is unclear right now. I didn't repeat to check for accuracy as she has already received a unit of blood. Two units were ordered.  --Eliquis was held at admission --Patient needs an EGD for further evaluation. The risks and benefits of EGD were discussed and the patient agrees to proceed. Maybe procedure can be done tomorrow. She has not been NPO. .  --Continue PPI, increasing to BID for now.  ADDENDUM: I asked nursing to get last colonoscopy report from Dr. Melina Copa. They have received it and will place in chart by patient's room  # PVD, s/p left  femoral to above -knee popliteal artery bypass 09/14/20.       HPI:                                                                                                                             Chief Complaint: anemia  Kathryn Hancock is a 70 y.o. 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   In December 2021 patient was admitted to [facility] for excision of an atrial tumor. She has PAD, found to have necrotic digits. Treated with heparin and transitioned to Eliquis. She also developed post -op atrial fibrillation that admission. Patient says that after hospital discharge in December she had black foul smelling stools for a several weeks. This lasted until sometime in January. She isn't sure if  any of her stools have been black since but she doesn't really check. She isn't sure if she takes iron at home but it isn't on home medication list. She has had intermittent loose stools for the last several months. She apparently had a colonoscopy with Dr. Melina Copa 2-3 years ago and thinks polyps were removed. She was supposed to have follow up ( ? Colonoscopy ) at some point but hasn't been back to see Dr. Melina Copa due to other health issues that have since come up.   Patient says her hgb has been low at dialysis for a couple of weeks. She presented to ED yesterday with weakness,  hgb of 8.2, down to 6.4 today. She has no abdominal pain other than occasional generalized lower abdominal burning. She has chronic intermittent nausea.     PREVIOUS ENDOSCOPIC EVALUATIONS / PERTINENT STUDIES    ? Colonoscopy with Dr. Melina Copa 2-3 years ago.    Past Medical History:  Diagnosis Date  . AAA (abdominal aortic aneurysm) ([place]) 06/2019   Multiple small pseudoaneurysmal projections of the dominant aorta.  Distal abdominal aortic aneurysm 4.5 x 4.7 cm.  Greatest AP dimension of the infrarenal aorta is 4.9.  No evidence of thoracic aortic aneurysm or  dissection.  Brief segment of proximal IMA occlusion.  . Anemia   . Coronary artery calcification seen on CAT scan 06/2019  . Coronary artery disease involving native coronary artery of native heart with unstable angina pectoris ([place]) 06/01/2020   Cardiac cath at [facility] 06/01/2020-preop for atrial myxoma resection -> severe proximal RCA and PDA -> had single-vessel CABG with SVG-PDA along with myxoma resection.  . Depression   . DM (diabetes mellitus), type 2 with renal complications ([place]) 07/31/2540  . 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 Dyspnea   . ESRD (end stage renal disease) on dialysis ([place]) 06/2020   Progression of CKDIIIb to ESRD initially related to thromboembolic event from left atrial myxoma; complicated by perioperative hypotension-; now 1 on HD TU/TH/SAT  . GERD (gastroesophageal reflux disease)   . Heavy smoker (more than 20 cigarettes per day)    ~ 2 ppd; since age 13 (100 pk yr) = has cut down to one half PPD.>  . Hyperlipidemia    Mixed  . Hyperlipidemia associated with type 2 diabetes mellitus ([place]) 08/05/2020  . Hypertension   . 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 = Status post resection December 23,2021 (done at [facility] because of no bed availability at [facility]  . Microscopic hematuria   . PAF (paroxysmal atrial fibrillation) ([place]) 07/01/2020   Initially noted postoperatively-left atrial myxoma resection and CABG x1.  Now on amiodarone and apixaban..  . Peripheral vascular disease ([place])   . Plantar wart of right foot   . PONV (postoperative nausea and vomiting)     Past Surgical History:  Procedure Laterality Date  . ABDOMINAL AORTOGRAM W/LOWER EXTREMITY N/A 07/08/2020   Procedure: ABDOMINAL AORTOGRAM W/LOWER EXTREMITY;  Surgeon: Angelia Mould, MD;  Location: [facility];  Service: Cardiovascular;  Laterality: N/A;  . AV FISTULA PLACEMENT Left  07/11/2020   Procedure: LEFT FIRST STAGE BRACHIOBASILIC UPPER EXTREMITY ARTERIOVENOUS (AV) FISTULA CREATION;  Surgeon: [name], MD;  Location: [facility];  Service: Vascular;  Laterality: Left;  . Cardiac MRI  01/22/2020   Normal LV size and function.  Moderate focal basal septal hypertrophy.  No S.A.M.  LVEF 61%.  RVEF  66%.  Large mobile mass in the left atrium attached to the interatrial septum-does not appear to thrombus characteristics consistent with left atrial myxoma.  . Chest CTA  06/2019   Multiple small pseudoaneurysmal projections of the dominant aorta.  Distal abdominal aortic aneurysm 4.5 x 4.7 cm.  Greatest AP dimension of the infrarenal aorta is 4.9.  No evidence of thoracic aortic aneurysm or dissection.  Brief segment of proximal IMA occlusion.  Large geographic filling defect of the left atrium with associated dense radiopaque material.  Aortic atherosclerosis and emphysema  . COLONOSCOPY W/ POLYPECTOMY    . CORONARY ARTERY BYPASS GRAFT     05/2020 at [facility]  . EYE SURGERY Bilateral    Cataract surgery  . FEMORAL-POPLITEAL BYPASS GRAFT Left 09/12/2020   Procedure: LEFT FEMORAL-ABOVE KNEE POPLITEAL ARTERY BYPASS GRAFT;  Surgeon: Angelia Mould, MD;  Location: [facility];  Service: Vascular;  Laterality: Left;  . IR FLUORO GUIDE CV LINE RIGHT  07/06/2020  . IR US GUIDE VASC ACCESS RIGHT  07/06/2020  . TRANSTHORACIC ECHOCARDIOGRAM  07/2019    EF 60 to 65%.  GR 1 DD.  Elevated LVEDP.  Large size ill-defined echodensity in the left atrium suspicious for thrombus versus tumor.  Moderately dilated LA.  . TUBAL LIGATION      Prior to Admission medications   Medication Sig Start Date End Date Taking? Authorizing Provider  acetaminophen (TYLENOL) 500 MG tablet Take 500-1,000 mg by mouth every 6 (six) hours as needed for moderate pain.   Yes [provider]  amiodarone (PACERONE) 200 MG tablet Take 200 mg by mouth daily. 06/10/20 06/10/21 Yes [provider]   amLODipine (NORVASC) 5 MG tablet Take 5 mg by mouth daily. 06/10/20 06/10/21 Yes [provider]  apixaban (ELIQUIS) 5 MG TABS tablet Take 1 tablet (5 mg total) by mouth 2 (two) times daily. 12/10/19  Yes Leonie Man, MD  Ascorbic Acid (VITAMIN C) 1000 MG tablet Take 1,000 mg by mouth every evening.   Yes [provider]  aspirin EC 81 MG tablet Take 81 mg by mouth daily. Swallow whole.   Yes [provider]  atorvastatin (LIPITOR) 20 MG tablet Take 20 mg by mouth daily.   Yes [provider]  carvedilol (COREG) 6.25 MG tablet TAKE 1 TABLET (6.25 MG TOTAL) BY MOUTH TWO TIMES DAILY WITH A MEAL. Patient taking differently: Take 6.25 mg by mouth 2 (two) times daily with a meal. 07/15/20 07/15/21 Yes Pahwani, Einar Grad, MD  docusate sodium (COLACE) 100 MG capsule Take 100 mg by mouth daily as needed for mild constipation.   Yes [provider]  famotidine (PEPCID) 20 MG tablet Take 20 mg by mouth daily as needed for headache.   Yes [provider]  fluticasone (FLONASE) 50 MCG/ACT nasal spray Place 2 sprays into both nostrils as needed for allergies or rhinitis.   Yes [provider]  folic acid (FOLVITE) 010 MCG tablet Take 800 mcg by mouth daily.   Yes [provider]  glimepiride (AMARYL) 1 MG tablet Take 1 mg by mouth 2 (two) times daily. Hold if blood sugar is less than 120   Yes [provider]  glucose blood test strip 1 each by Other route as needed. Use as instructed   Yes [provider]  lidocaine-prilocaine (EMLA) cream Apply 1 application topically daily as needed (dialysis). 08/12/20  Yes [provider]  loperamide (IMODIUM A-D) 2 MG tablet Take 2 mg by mouth 4 (four)  times daily as needed for diarrhea or loose stools.   Yes [provider]  Methoxy PEG-Epoetin Beta (MIRCERA IJ) Mircera 09/20/20  Yes [provider]  montelukast (SINGULAIR) 10 MG tablet Take 10 mg by mouth daily as  needed (allergies).   Yes [provider]  oxyCODONE (OXY IR/ROXICODONE) 5 MG immediate release tablet Take 5-10 mg by mouth 2 (two) times daily as needed for moderate pain. 06/09/20  Yes [provider]  Vitamin D3 (VITAMIN D) 25 MCG tablet Take 2,000 Units by mouth every evening.   Yes [provider]    Current Facility-Administered Medications  Medication Dose Route Frequency Provider Last Rate Last Admin  . 0.9 %  sodium chloride infusion (Manually program via Guardrails IV Fluids)   Intravenous Once Simmons-Robinson, Makiera, MD      . 0.9 %  sodium chloride infusion (Manually program via Guardrails IV Fluids)   Intravenous Once Simmons-Robinson, Makiera, MD      . 0.9 %  sodium chloride infusion  10 mL/hr Intravenous Once Simmons-Robinson, Makiera, MD      . amiodarone (PACERONE) tablet 200 mg  200 mg Oral Daily Simmons-Robinson, Makiera, MD   200 mg at 09/30/20 0816  . atorvastatin (LIPITOR) tablet 20 mg  20 mg Oral Daily Simmons-Robinson, Makiera, MD   20 mg at 09/30/20 0855  . carvedilol (COREG) tablet 6.25 mg  6.25 mg Oral BID WC Simmons-Robinson, Makiera, MD   6.25 mg at 09/30/20 0855  . Chlorhexidine Gluconate Cloth 2 % PADS 6 each  6 each Topical Q0600 Collins, Samantha G, PA-C      . insulin aspart (novoLOG) injection 0-6 Units  0-6 Units Subcutaneous TID WC Simmons-Robinson, Makiera, MD      . oxyCODONE (Oxy IR/ROXICODONE) immediate release tablet 5 mg  5 mg Oral Q6H PRN Simmons-Robinson, Makiera, MD   5 mg at 09/30/20 6378  . pantoprazole (PROTONIX) EC tablet 40 mg  40 mg Oral Daily Simmons-Robinson, Makiera, MD   40 mg at 09/30/20 0855  . polyethylene glycol (MIRALAX / GLYCOLAX) packet 17 g  17 g Oral Daily PRN Simmons-Robinson, Makiera, MD        Allergies as of 09/29/2020 - Review Complete 09/29/2020  Allergen Reaction Noted  . Penicillins Other (See Comments) 04/01/2019    Family History  Problem Relation Age of Onset  . Diabetes Mother   .  Diabetes Father   . Heart attack Father 53  . CAD Father   . Hyperlipidemia Father   . Hypertension Father     Social History   Socioeconomic History  . Marital status: Widowed    Spouse name: Not on file  . Number of children: Not on file  . Years of education: Not on file  . Highest education level: Not on file  Occupational History  . Not on file  Tobacco Use  . Smoking status: Current Every Day Smoker    Packs/day: 0.50    Years: 50.00    Pack years: 25.00    Types: Cigarettes  . Smokeless tobacco: Never Used  Vaping Use  . Vaping Use: Never used  Substance and Sexual Activity  . Alcohol use: Not Currently  . Drug use: Never  . Sexual activity: Not on file  Other Topics Concern  . Not on file  Social History Narrative   She is a mother of 2-but her son died in 2019-10-09 of a drug overdose.   Denies alcohol use.      Patient  is widowed and lives with her daughter and several grandchildren locally in [place].     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     At baseline, she lives a sedentary lifestyle and does not exercise or get out of the house much at all.  She is limited by bilateral calf claudication.     She also experiences some exertional shortness of breath which is chronic and stable.       She smokes between 1 and 2 packs of cigarettes daily and has been smoking since she was age 83.     Social Determinants of Health   Financial Resource Strain: Not on file  Food Insecurity: Not on file  Transportation Needs: Not on file  Physical Activity: Not on file  Stress: Not on file  Social Connections: Not on file  Intimate Partner Violence: Not on file    Review of Systems: All systems reviewed and negative except where noted in HPI.  OBJECTIVE:    Physical Exam: Vital signs in last 24 hours: Temp:  [97.4 F (36.3 C)-99.6 F (37.6 C)] 98.5 F (36.9 C) (04/22 0900) Pulse Rate:  [65-80] 67 (04/22  0900) Resp:  [15-18] 18 (04/22 0600) BP: (97-135)/(33-58) 132/58 (04/22 0900) SpO2:  [93 %-100 %] 100 % (04/22 0900) Weight:  [64 kg] 64 kg (04/22 0415) Last BM Date: 09/29/20 General:   Alert  female in NAD Psych:  Pleasant, cooperative. Normal mood and affect. Eyes:  Pupils equal, sclera clear, no icterus.   Conjunctiva pink. Ears:  Normal auditory acuity. Nose:  No deformity, discharge,  or lesions. Neck:  Supple; no masses Lungs:  Clear throughout to auscultation.   No wheezes, crackles, or rhonchi.  Heart:  Regular rate, + murmur, 1+ bilateral pedal edema. Necrotic appearing toes on both feet / right heel Abdomen:  Soft, non-distended, nontender, BS active, no palp mass   Rectal:  Deferred  Msk:  Symmetrical without gross deformities. . Neurologic:  Alert and  oriented x4;  grossly normal neurologically. Skin:  Intact without significant lesions or rashes.  Filed Weights   09/30/20 0415  Weight: 64 kg    Scheduled inpatient medications . sodium chloride   Intravenous Once  . sodium chloride   Intravenous Once  . amiodarone  200 mg Oral Daily  . atorvastatin  20 mg Oral Daily  . carvedilol  6.25 mg Oral BID WC  . Chlorhexidine Gluconate Cloth  6 each Topical Q0600  . insulin aspart  0-6 Units Subcutaneous TID WC  . pantoprazole  40 mg Oral Daily      Intake/Output from previous day: 04/21 0701 - 04/22 0700 In: 332 [Blood:332] Out: -  Intake/Output this shift: No intake/output data recorded.   Lab Results: Recent Labs    09/29/20 1242 09/30/20 0317  WBC 17.6* 12.5*  HGB 8.2* 6.4*  HCT 27.9* 20.2*  PLT 296 230   BMET Recent Labs    09/29/20 1351 09/30/20 0317  NA 135 133*  K 4.3 4.4  CL 98 96*  CO2 27 28  GLUCOSE 181* 100*  BUN 10 24*  CREATININE 1.59* 2.49*  CALCIUM 8.1* 7.8*   LFT Recent Labs    09/30/20 0317  PROT 4.7*  ALBUMIN 1.9*  1.9*  AST 12*  ALT 10  ALKPHOS 128*  BILITOT 0.5  BILIDIR 0.1  IBILI 0.4   PT/INR Recent Labs     09/30/20 0317  LABPROT 16.6*  INR 1.3*  Hepatitis Panel No results for input(s): HEPBSAG, HCVAB, HEPAIGM, HEPBIGM in the last 72 hours.   . CBC Latest Ref Rng & Units 09/30/2020 09/29/2020 09/13/2020  WBC 4.0 - 10.5 K/uL 12.5(H) 17.6(H) 12.2(H)  Hemoglobin 12.0 - 15.0 g/dL 6.4(LL) 8.2(L) 8.9(L)  Hematocrit 36.0 - 46.0 % 20.2(L) 27.9(L) 28.2(L)  Platelets 150 - 400 K/uL 230 296 186    . CMP Latest Ref Rng & Units 09/30/2020 09/29/2020 09/13/2020  Glucose 70 - 99 mg/dL 100(H) 181(H) 174(H)  BUN 8 - 23 mg/dL 24(H) 10 34(H)  Creatinine 0.44 - 1.00 mg/dL 2.49(H) 1.59(H) 4.02(H)  Sodium 135 - 145 mmol/L 133(L) 135 133(L)  Potassium 3.5 - 5.1 mmol/L 4.4 4.3 4.7  Chloride 98 - 111 mmol/L 96(L) 98 101  CO2 22 - 32 mmol/L 28 27 21(L)  Calcium 8.9 - 10.3 mg/dL 7.8(L) 8.1(L) 8.1(L)  Total Protein 6.5 - 8.1 g/dL 4.7(L) 6.0(L) -  Total Bilirubin 0.3 - 1.2 mg/dL 0.5 0.6 -  Alkaline Phos 38 - 126 U/L 128(H) 169(H) -  AST 15 - 41 U/L 12(L) 15 -  ALT 0 - 44 U/L 10 13 -   Studies/Results: DG Chest 1 View  Result Date: 09/29/2020 CLINICAL DATA:  Weakness. Dialysis patient. EXAM: CHEST  1 VIEW COMPARISON:  07/01/2020 FINDINGS: Normal sized heart. Stable post CABG changes. Interval right jugular double-lumen catheter with the lumen tips in the superior vena cava. Clear lungs with normal vascularity. Unremarkable bones. IMPRESSION: No acute abnormality. Electronically Signed   By: Claudie Revering M.D.   On: 09/29/2020 13:31    Active Problems:   ESRD on dialysis ([place])   S/P CABG (coronary artery bypass graft)   Symptomatic anemia   Current use of long term anticoagulation   Weakness    Tye Savoy, NP-C @  09/30/2020, 10:24 AM

## 2020-09-30 NOTE — Progress Notes (Signed)
Initial Nutrition Assessment  DOCUMENTATION CODES:   Non-severe (moderate) malnutrition in context of chronic illness  INTERVENTION:   -Glucerna Shake po BID, each supplement provides 220 kcal and 10 grams of protein -Renal MVI daily -30 ml Prosource Plus BID, each supplement provides 100 kcals and 15 grams protein  NUTRITION DIAGNOSIS:   Moderate Malnutrition related to chronic illness (ESRD on HD) as evidenced by energy intake < or equal to 75% for > or equal to 1 month,mild fat depletion,mild muscle depletion,moderate muscle depletion.  GOAL:   Patient will meet greater than or equal to 90% of their needs  MONITOR:   PO intake,Supplement acceptance,Labs,Weight trends,Skin,I & O's  REASON FOR ASSESSMENT:   Consult Poor PO,Wound healing  ASSESSMENT:   Kathryn Hancock is a 70 y.o. female presenting with anemia . PMH is significant for T2DM, hypertension, CAD s/p CABG1, PAF (Eliquis), AAA, ESRD TThSa and HLD.  Pt admitted with symptomatic anemia likely related to GIB.   Reviewed I/O's: +332 ml x 24 hours  Per [facility] notes, with chronic unstageable pressure injuries to sacrum and bilateral hips.   Per GI notes, plan for EGD tomorrow.   Spoke with pt and daughter at bedside. Pt reports that she feels hungry, but suspects this is due to being NPO yesterday- observed pt consume about 50% of her lunch tray.   Pt daughter reports that pt has experienced a general decline in health over the past months, due to multiple hospitalizations and surgeries. Per daughter, this has gotten worse over the past few weeks since undergoing lt femoral popliteal artery bypass earlier this month; pt has had to have more help around the house, particularly after HD treatments. During this time, pt's appetite has been "non-existent" and daughter has been following a liberalized diet to help pt eat- she admits to eating foods such as sausage biscuits, barbecue, and milkshakes. Pt has also been consuming  Boost Glucose control at home to assist with PO intake.   Reviewed wt hx; wt has been stable over the past 2 months. Per nephrology notes, EDW 59 kg. Per pt daughter, pt has lost "a lot" of weight, but unsure of amount of UBW. Also suspect edema is masking further weight loss as well as fat and muscle depletions.  Discussed importance of good meal and supplement intake to promote healing.  Lab Results  Component Value Date   HGBA1C 11.1 (H) 09/29/2020   PTA DM medications are 1 mg glimepiride daily.   Labs reviewed: CBGS: 113 (inpatient orders for glycemic control are 0-6 units insulin aspart TID with meals).   NUTRITION - FOCUSED PHYSICAL EXAM:  Flowsheet Row Most Recent Value  Orbital Region Mild depletion  Upper Arm Region Mild depletion  Thoracic and Lumbar Region No depletion  Buccal Region No depletion  Temple Region Moderate depletion  Clavicle Bone Region No depletion  Clavicle and Acromion Bone Region No depletion  Scapular Bone Region No depletion  Dorsal Hand Mild depletion  Patellar Region Mild depletion  Anterior Thigh Region Mild depletion  Posterior Calf Region Mild depletion  Edema (RD Assessment) Mild  Hair Reviewed  Eyes Reviewed  Mouth Reviewed  Skin Reviewed  Nails Reviewed       Diet Order:   Diet Order            Diet NPO time specified  Diet effective midnight           Diet renal with fluid restriction Fluid restriction: 1200 mL Fluid; Room service appropriate? Yes;  Fluid consistency: Thin  Diet effective now                 EDUCATION NEEDS:   Education needs have been addressed  Skin:  Skin Assessment: Skin Integrity Issues: Skin Integrity Issues:: Unstageable,Incisions,Other (Comment) Unstageable: sacrum, bilateral hips Incisions: closed lt leg Other: wounds to rt and lt buttocks  Last BM:  09/29/20  Height:   Ht Readings from Last 1 Encounters:  09/30/20 5' 2.99" (1.6 m)    Weight:   Wt Readings from Last 1 Encounters:   09/30/20 64 kg    Ideal Body Weight:  52.3 kg  BMI:  Body mass index is 25 kg/m.  Estimated Nutritional Needs:   Kcal:  1007-1219  Protein:  100-115 grams  Fluid:  1000 ml + UOP    Loistine Chance, RD, LDN, [place] Registered Dietitian II Certified Diabetes Care and Education Specialist Please refer to [facility] for RD and/or RD on-call/weekend/after hours pager

## 2020-09-30 NOTE — Progress Notes (Signed)
Family Medicine Teaching Service Daily Progress Note Intern Pager: [phone]  Patient name: Kathryn Hancock Medical record number: [id number] Date of birth: 12-27-1950 Age: 70 y.o. Gender: female  Primary Care Provider: Gala Lewandowsky, MD Consultants: Nephrology  Code Status: Full   Pt Overview and Major Events to Date:  4/21: Admitted   Assessment and Plan: Kathryn Hancock is a 70 y.o. female presenting with anemia . PMH is significant for T2DM, hypertension, CAD s/p CABG1, PAF (Eliquis), AAA, ESRD TThSa and HLD.   Anemia likely due to GI Bleed  Patient admitted for symptomatic anemia, although symptoms seem to have improved per patient, workup demonstrates otherwise. Presented with Hgb 6 after HD, in the ED improved to 8.2 without intervention. S/p 1 unit pRBC, Hgb 6.4 most recently. Multiple hematomas along with vasculopath history likely cause of ongoing anemia.  -GI consulted, appreciate recommendations  -s/p 1 unit pRBC, another unit ordered - f/u post transfusion CBC, then monitor CBC -transfusion threshold 8  -IV protonix 40 mg  -PT/OT to evaluate   ESRD on HD Cr 2.49 and GFR 21 this morning. HD sessions Tues, Thurs and Sat since Jan of 2022. Last session was earlier on 4/21, patient reports completing a full session. Another fistula procedure that appears to be scheduled for 4/25 per chart review. -touch base with vascular surgery office in am  -nephrology consulted, appreciate of involvement and recs -monitor RFP   Hyponatremia Na 133 this morning. -monitor BMP  Paroxysmal atrial fibrillation, rate controlled Home medication includes eliquis 5 mg bid. HR 65 this morning. -hold home eliquis given concern for bleed  -cardiac monitoring  -monitor EKG - continue home coreg 6.25  Bilateral LE gangrene Noted to have bilateral LE gangrenous area along toes of both feet. Daughter states that this seems to be improving after re-vascularization procedure earlier during the  month of April 2022. Sensation is intact in these areas although very tender to light touch on exam.  -consult wound care  -may benefit from discussing with vascular while patient admitted to see if any intervention needed while inpatient  -patient to continue home pain management with oxycodone 46m   Leukocytosis  Improved to 12.5 from admission WBC 17.6, likely reactive. No signs of infection and vitals stable. Expected to downtrend, continue to monitor. -monitor temp -pending UA -pending urine culture  -monitor CBC  Hyperglycemia in the setting of Type 2 DM Blood glucose 172 most recently, home medications include glimepiride 1 mg bid with instructions to hold if blood sugar <120.  -hold home med -very sensitive sliding scale -monitor CBGs  Elevated LFT Improving. Elevated alkaline phosphatase 128. -monitor CMP -consider RUQ ultrasound if alk phos continues to uptrend  Hypertension BP 128/51 this morning, home medications include amlodipine 5 mg and coreg 6.25 mg bid daily.  -hold home amlodipine given concern for hypotension - continue Coreg for rate control   CAD  CABG x1  Left atrial myxoma  History of AAA Patient had left atrial myxoma removed in Dec of 2021. Home medications include aspirin 81 mg daily and atorvastatin 20 mg daily. -hold aspirin given possible bleed -continue home atorvastatin  -transfusion threshold 8  Tobacco use Smokes 1/2 ppd, down from 2.5 ppd. Declines nicotine patch, states that it is the habit that she likes and that a patch will not help. -nicotine patch if desired -encourage cesstation   Protein calorie malnutrition  Albumin 2.4 and total protein 6 on admission, secondary to poor oral intake. Patient endorsing decreased appetite over the  past few weeks. -renal diet -nutrition consult    FEN/GI: renal diet  PPx: SCDs    Status is: Observation  The patient remains OBS appropriate and will d/c before 2 midnights.  Dispo:  The patient is from: Home              Anticipated d/c is to: Home              Patient currently is not medically stable to d/c.   Difficult to place patient No        Subjective:  No acute overnight events reported. As soon as I walk into the room, patient recognizes me. Patient denies dyspnea but endorsing some weakness. Overall feels better compared to yesterday on admission. Patient shares that she went to the restroom without issues and did not feel dizzy and denies worsening weakness. Daughter not present at bedside.   Objective: Temp:  [97.4 F (36.3 C)-99.6 F (37.6 C)] 98.2 F (36.8 C) (04/22 6579) Pulse Rate:  [65-80] 65 (04/22 0613) Resp:  [15-18] 18 (04/22 0600) BP: (97-135)/(33-56) 128/51 (04/22 0613) SpO2:  [93 %-100 %] 97 % (04/22 0613) Weight:  [64 kg] 64 kg (04/22 0415) Physical Exam: General: Patient sitting upright in bed, pale-appearing, in no acute distress. Cardiovascular: RRR, no murmurs or gallops auscultated  Respiratory: CTAB, no rales or rhonchi noted, breathing comfortably on room air Abdomen: soft, nontender, nondistended, BS+ Extremities: radial and distal pulses strong and equal bilaterally, 1+ LE edema noted bilaterally, pedal edema noted, gangrenous toes bilaterally stable from on admission  Psych: mood appropriate   Laboratory: Recent Labs  Lab 09/29/20 1242 09/30/20 0317  WBC 17.6* 12.5*  HGB 8.2* 6.4*  HCT 27.9* 20.2*  PLT 296 230   Recent Labs  Lab 09/29/20 1351 09/30/20 0317  NA 135 133*  K 4.3 4.4  CL 98 96*  CO2 27 28  BUN 10 24*  CREATININE 1.59* 2.49*  CALCIUM 8.1* 7.8*  PROT 6.0* 4.7*  BILITOT 0.6 0.5  ALKPHOS 169* 128*  ALT 13 10  AST 15 12*  GLUCOSE 181* 100*      Imaging/Diagnostic Tests: DG Chest 1 View  Result Date: 09/29/2020 CLINICAL DATA:  Weakness. Dialysis patient. EXAM: CHEST  1 VIEW COMPARISON:  07/01/2020 FINDINGS: Normal sized heart. Stable post CABG changes. Interval right jugular double-lumen  catheter with the lumen tips in the superior vena cava. Clear lungs with normal vascularity. Unremarkable bones. IMPRESSION: No acute abnormality. Electronically Signed   By: Claudie Revering M.D.   On: 09/29/2020 13:31    Donney Dice, DO 09/30/2020, 6:59 AM PGY-1, [facility] Intern pager: [phone], text pages welcome

## 2020-09-30 NOTE — Progress Notes (Signed)
[facility] Progress Note   Subjective:   Hemoglobin decreased to 6.4 overnight, getting PRBC. Reports fatigue is slightly better. No SOB, CP, palpitations or dizziness. She is hungry. Primary complaint is ongoing pain in feet/posterior hips.   Objective Vitals:   09/30/20 0415 09/30/20 0600 09/30/20 0613 09/30/20 0900  BP:  (!) 135/56 (!) 128/51 (!) 132/58  Pulse:  77 65 67  Resp:  18    Temp:  97.9 F (36.6 C) 98.2 F (36.8 C) 98.5 F (36.9 C)  TempSrc:  Oral Oral Oral  SpO2:   97% 100%  Weight: 64 kg     Height: 5' 2.99" (1.6 m)      Physical Exam General: WDWN female, alert and in NAD Heart: RRR, no murmurs, rubs or gallops Lungs: CTA anteriorly without wheezing, rhonchi or rales Abdomen: Soft, non-tender, non-distended, +BS Extremities: 1+ pedal edema with dry gangrene bilateral toes Dialysis Access:  R IJ TDC, AVF maturing  Additional Objective Labs: Basic Metabolic Panel: Recent Labs  Lab 09/29/20 1351 09/30/20 0317  NA 135 133*  K 4.3 4.4  CL 98 96*  CO2 27 28  GLUCOSE 181* 100*  BUN 10 24*  CREATININE 1.59* 2.49*  CALCIUM 8.1* 7.8*  PHOS  --  3.1   Liver Function Tests: Recent Labs  Lab 09/29/20 1351 09/30/20 0317  AST 15 12*  ALT 13 10  ALKPHOS 169* 128*  BILITOT 0.6 0.5  PROT 6.0* 4.7*  ALBUMIN 2.4* 1.9*  1.9*   CBC: Recent Labs  Lab 09/29/20 1242 09/30/20 0317  WBC 17.6* 12.5*  NEUTROABS 14.7*  --   HGB 8.2* 6.4*  HCT 27.9* 20.2*  MCV 112.5* 103.1*  PLT 296 230   CBG: Recent Labs  Lab 09/29/20 2237 09/30/20 0746  GLUCAP 172* 113*   Iron Studies:  Recent Labs    09/30/20 0543  IRON 46  TIBC 231*  FERRITIN 237   @[email or web address] @ Studies/Results: DG Chest 1 View  Result Date: 09/29/2020 CLINICAL DATA:  Weakness. Dialysis patient. EXAM: CHEST  1 VIEW COMPARISON:  07/01/2020 FINDINGS: Normal sized heart. Stable post CABG changes. Interval right jugular double-lumen catheter with the lumen tips in the  superior vena cava. Clear lungs with normal vascularity. Unremarkable bones. IMPRESSION: No acute abnormality. Electronically Signed   By: Claudie Revering M.D.   On: 09/29/2020 13:31   Medications: . sodium chloride     . sodium chloride   Intravenous Once  . sodium chloride   Intravenous Once  . amiodarone  200 mg Oral Daily  . atorvastatin  20 mg Oral Daily  . carvedilol  6.25 mg Oral BID WC  . insulin aspart  0-6 Units Subcutaneous TID WC  . pantoprazole  40 mg Oral Daily    Outpatient Dialysis Orders: Center: [facility]  on TTS. 3 hrs 45 min, 180NRe Optiflux, BFR 400, DFR Manual 500 mL/min, EDW 59 (kg), Dialysate 3.0 K, 2.5 Ca, Received mircera 100 mcg on 09/20/20 No heparin, VDRA or binder  Assessment/Plan: 1.  Symptomatic anemia: Pt presenting with weakness and dark stools, Hgb 6.8 on 4/14 at outpatient unit and 8.2 in the ED,dropped to 6's overnight and receiving PRBC. FOBT positive- work up per primary team. Recently received higher dose of ESA, not due for redose yet. Hx of CAD, transfuse PRN for Hgb <8.    2.  ESRD:  Dialyzes on TTS schedule. Slightly volume overloaded but no respiratory distress. Will plan for next dialysis on Friday. Planned for  second stage AVF surgery on 4/25 if stable at that time.  3.  Hypertension/volume: BP soft, not reaching EDW outpatient due to hypotension. + pedal edema. will attempt UF with HD as tolerated. 4.  Metabolic bone disease: Calcium controlled. Not on VDRA or phosphorus binders, follow labs.  5.  Nutrition:  Renal diet/fluid restrictions recommended.  6. PAD: gangrene bilateral lower extremities, recent bypass by VVS.  7. Wounds: Pt reports painful wounds on hips/buttocks. Management per admitting team.    Anice Paganini, PA-C 09/30/2020, 9:17 AM  [facility] Pager: [phone]

## 2020-09-30 NOTE — Progress Notes (Signed)
Notified intern of pt's hemoglobin at this time.

## 2020-09-30 NOTE — Progress Notes (Signed)
Pt arrive to 5N11 from ED. Pt is alert and oriented x4. Pt is able to ambulate with a standby assist. She reports no pain at this time. Skin assessment completed with Blanch Media, RN. Will continue to monitor.

## 2020-09-30 NOTE — Progress Notes (Signed)
Occupational Therapy Evaluation Patient Details Name: Kathryn Hancock MRN: [id number] DOB: 1950/06/22 Today's Date: 09/30/2020    History of Present Illness Pt is 70 yo female presenting with symptomatic anemia 09/29/20.  Hgb 6.0 at HD, 8.2 in ED, received unit PRBC, back to 6.4 4/22, received unit PRBC 4/22.  Pt with PMH  significant for T2DM, hypertension, CAD s/p CABG1, PAF (Eliquis), AAA, ESRD TThSa and HLD.  Pt s/p re-vascularization surgery ~2 weeks ago. Bil toes with dry gangrene.   Clinical Impression   Daughter was present for this eval, she provides 24/7 support to pt at home. Daughter and pt reported pt was mod I with some ADLs but required supervision for all functional mobility with rollator as well as assistance with bathing and dressing. Pt shared that she feels as if she has been declining in health and indep since December. Pt with low Hgb and very fatigued from PT session, and trips to the bathroom therefore this eval was limited to bed level with 1 transfer OOB. Pt with increased pain in bilat feet and hips due to gangrene and sores. Pt was min A for bed mobility for LLE, min guard for sit<>stand, and min guard for ambulation for increased safety. Pt benefits from continued OT services actue to progress function in ADLs. Recommend d/c to home with HHOT to continue to progress in function and safety.     Follow Up Recommendations  Home health OT    Equipment Recommendations   (reacher, pt requesting wc)       Precautions / Restrictions Precautions Precautions: Fall Restrictions Weight Bearing Restrictions: No      Mobility Bed Mobility Overal bed mobility: Needs Assistance Bed Mobility: Supine to Sit     Supine to sit: Supervision Sit to supine: Min assist   General bed mobility comments: Min A for sup>sit for LLE due to fatigue after session    Transfers Overall transfer level: Needs assistance Equipment used: Rolling walker (2 wheeled) Transfers: Sit to/from  Stand Sit to Stand: Min guard Stand pivot transfers: Min guard       General transfer comment: for safety    Balance Overall balance assessment: Needs assistance Sitting-balance support: No upper extremity supported;Feet supported Sitting balance-Leahy Scale: Good     Standing balance support: No upper extremity supported Standing balance-Leahy Scale: Fair Standing balance comment: RW for ambulation, able to perform toielting ADLs and washing hands without support but reports feeling unsteady         ADL either performed or assessed with clinical judgement   ADL Overall ADL's : Needs assistance/impaired Eating/Feeding: Independent;Sitting   Grooming: Wash/dry hands;Wash/dry face;Oral care;Applying deodorant;Brushing hair;Independent;Sitting   Upper Body Bathing: Supervision/ safety;Set up;Sitting   Lower Body Bathing: Moderate assistance;Sit to/from stand   Upper Body Dressing : Set up;Supervision/safety;Sitting   Lower Body Dressing: Moderate assistance;Sit to/from stand   Toilet Transfer: Min guard;RW;Comfort height toilet;Grab bars   Toileting- Water quality scientist and Hygiene: Minimal assistance;Sit to/from stand       Functional mobility during ADLs: Min guard;Rolling walker General ADL Comments: Pt fatigues quickly, bilat toes causing a lot of pain in standing and while ambulating     Vision Baseline Vision/History: Wears glasses Wears Glasses: Reading only              Pertinent Vitals/Pain Pain Assessment: Faces Faces Pain Scale: Hurts even more Pain Location: bilat toes and hips Pain Descriptors / Indicators: Grimacing;Guarding Pain Intervention(s): Limited activity within patient's tolerance;Monitored during session  Hand Dominance Right   Extremity/Trunk Assessment Upper Extremity Assessment Upper Extremity Assessment: Overall WFL for tasks assessed;Generalized weakness   Lower Extremity Assessment Lower Extremity Assessment: Defer to  PT evaluation;RLE deficits/detail;LLE deficits/detail RLE Deficits / Details: toes gangrene at baseline, edema LLE Deficits / Details: toes gangrene, edema   Cervical / Trunk Assessment Cervical / Trunk Assessment: Kyphotic   Communication Communication Communication: No difficulties   Cognition Arousal/Alertness: Awake/alert Behavior During Therapy: WFL for tasks assessed/performed Overall Cognitive Status: Within Functional Limits for tasks assessed               General Comments  pt with wounds on bilat feet, hips and buttocks; multiple brusises grossly throughout            Home Living Family/patient expects to be discharged to:: Private residence Living Arrangements: Children Available Help at Discharge: Family;Available 24 hours/day Type of Home: House Home Access: Stairs to enter [employer] of Steps: 1   Home Layout: One level     Bathroom Shower/Tub: Teacher, early years/pre: Standard     Home Equipment: Tub bench;Hand held shower head;Bedside commode;Walker - 4 wheels   Additional Comments: Daughter provides 24/7 care to pt      Prior Functioning/Environment Level of Independence: Needs assistance  Gait / Transfers Assistance Needed: Using rollator for ambulation-could ambulate short community distances prior to recent sx ADL's / Homemaking Assistance Needed: Pt reports she is typically independent with ADLS (sponge bathing) but dtr assist at times; dtr helps with meds, transportation, IADLs   Comments: Pt has had 4 sx witnin 4 months, reports that she feels as if she is going "down hill" ever since December 2021        OT Problem List: Decreased strength;Decreased range of motion;Decreased activity tolerance;Impaired balance (sitting and/or standing);Pain      OT Treatment/Interventions: Self-care/ADL training;Patient/family education;Therapeutic activities;Energy conservation;DME and/or AE instruction    OT Goals(Current  goals can be found in the care plan section) Acute Rehab OT Goals Patient Stated Goal: to go home OT Goal Formulation: With patient Time For Goal Achievement: 10/14/20 Potential to Achieve Goals: Good ADL Goals Pt Will Perform Lower Body Bathing: with min guard assist;with adaptive equipment;sit to/from stand Pt Will Perform Lower Body Dressing: with min assist;with adaptive equipment;sit to/from stand Pt Will Transfer to Toilet: with min guard assist;ambulating Pt Will Perform Toileting - Clothing Manipulation and hygiene: with min guard assist;sit to/from stand  OT Frequency: Min 2X/week    AM-PAC OT "6 Clicks" Daily Activity     Outcome Measure Help from another person eating meals?: None Help from another person taking care of personal grooming?: A Little Help from another person toileting, which includes using toliet, bedpan, or urinal?: A Little Help from another person bathing (including washing, rinsing, drying)?: A Lot Help from another person to put on and taking off regular upper body clothing?: A Little Help from another person to put on and taking off regular lower body clothing?: A Lot 6 Click Score: 17   End of Session    Activity Tolerance:   Patient left:    OT Visit Diagnosis: Unsteadiness on feet (R26.81);Other abnormalities of gait and mobility (R26.89);Muscle weakness (generalized) (M62.81);Pain Pain - Right/Left: Right (and left) Pain - part of body: Ankle and joints of foot;Hip                Time: 3976-7341 OT Time Calculation (min): 18 min Charges:  OT General Charges $OT Visit: 1 Visit OT  Evaluation $OT Eval Low Complexity: 1 Low    Wells Gerdeman A Brandley Aldrete 09/30/2020, 1:57 PM

## 2020-09-30 NOTE — Consult Note (Addendum)
Referring Provider:  Family Medicine         Primary Care Physician:  Gala Lewandowsky, MD Primary Gastroenterologist:  Althia Forts           We were asked to see this patient for:   anemia               Attending physician's note   I have taken a history, examined the patient and reviewed the chart. I agree with the Advanced Practitioner's note, impression and recommendations.  70 year old female with end-stage renal disease on hemodialysis, multiple comorbidities admitted with symptomatic anemia and melena. She is on chronic anticoagulation with Eliquis Hemoglobin dropped from 8--->6  Transfuse PRBC to hemoglobin greater than 7 PPI twice daily N.p.o. after midnight  We will plan for EGD for further evaluation and endoscopic intervention if needed The risks and benefits as well as alternatives of endoscopic procedure(s) have been discussed and reviewed. All questions answered. The patient agrees to proceed.    Kathryn Hancock , MD [phone]   ASSESSMENT / PLAN:   # 70 yo female with multiple medical problems including ESRD on HD Tu / Th / Sat. Admitted with symptomatic macrocytic anemia, intermittent black stools since December on Eliquis + ASA. Baseline hgb hard to discern as it has ranged between high 8.3  and high 12 for last several months.   In ED yesterday it was 8.2, down to 6.4 today. Cause of 2 gram drop in hgb overnight is unclear right now. I didn't repeat to check for accuracy as she has already received a unit of blood. Two units were ordered.  --Eliquis was held at admission --Patient needs an EGD for further evaluation. The risks and benefits of EGD were discussed and the patient agrees to proceed. Maybe procedure can be done tomorrow. She has not been NPO. .  --Continue PPI, increasing to BID for now.  ADDENDUM: I asked nursing to get last colonoscopy report from Dr. Melina Copa. They have received it and will place in chart by patient's room  # PVD, s/p left  femoral to above -knee popliteal artery bypass 09/14/20.       HPI:                                                                                                                             Chief Complaint: anemia  Kathryn Hancock is a 70 y.o. 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   In December 2021 patient was admitted to [facility] for excision of an atrial tumor. She has PAD, found to have necrotic digits. Treated with heparin and transitioned to Eliquis. She also developed post -op atrial fibrillation that admission. Patient says that after hospital discharge in December she had black foul smelling stools for a several weeks. This lasted until sometime in January. She isn't sure if  any of her stools have been black since but she doesn't really check. She isn't sure if she takes iron at home but it isn't on home medication list. She has had intermittent loose stools for the last several months. She apparently had a colonoscopy with Dr. Melina Copa 2-3 years ago and thinks polyps were removed. She was supposed to have follow up ( ? Colonoscopy ) at some point but hasn't been back to see Dr. Melina Copa due to other health issues that have since come up.   Patient says her hgb has been low at dialysis for a couple of weeks. She presented to ED yesterday with weakness,  hgb of 8.2, down to 6.4 today. She has no abdominal pain other than occasional generalized lower abdominal burning. She has chronic intermittent nausea.     PREVIOUS ENDOSCOPIC EVALUATIONS / PERTINENT STUDIES    ? Colonoscopy with Dr. Melina Copa 2-3 years ago.    Past Medical History:  Diagnosis Date  . AAA (abdominal aortic aneurysm) ([place]) 06/2019   Multiple small pseudoaneurysmal projections of the dominant aorta.  Distal abdominal aortic aneurysm 4.5 x 4.7 cm.  Greatest AP dimension of the infrarenal aorta is 4.9.  No evidence of thoracic aortic aneurysm or  dissection.  Brief segment of proximal IMA occlusion.  . Anemia   . Coronary artery calcification seen on CAT scan 06/2019  . Coronary artery disease involving native coronary artery of native heart with unstable angina pectoris ([place]) 06/01/2020   Cardiac cath at [facility] 06/01/2020-preop for atrial myxoma resection -> severe proximal RCA and PDA -> had single-vessel CABG with SVG-PDA along with myxoma resection.  . Depression   . DM (diabetes mellitus), type 2 with renal complications ([place]) 6/83/4196  . 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 Dyspnea   . ESRD (end stage renal disease) on dialysis ([place]) 06/2020   Progression of CKDIIIb to ESRD initially related to thromboembolic event from left atrial myxoma; complicated by perioperative hypotension-; now 1 on HD TU/TH/SAT  . GERD (gastroesophageal reflux disease)   . Heavy smoker (more than 20 cigarettes per day)    ~ 2 ppd; since age 34 (100 pk yr) = has cut down to one half PPD.>  . Hyperlipidemia    Mixed  . Hyperlipidemia associated with type 2 diabetes mellitus ([place]) 08/05/2020  . Hypertension   . 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 = Status post resection December 23,2021 (done at [facility] because of no bed availability at [facility]  . Microscopic hematuria   . PAF (paroxysmal atrial fibrillation) ([place]) 07/01/2020   Initially noted postoperatively-left atrial myxoma resection and CABG x1.  Now on amiodarone and apixaban..  . Peripheral vascular disease ([place])   . Plantar wart of right foot   . PONV (postoperative nausea and vomiting)     Past Surgical History:  Procedure Laterality Date  . ABDOMINAL AORTOGRAM W/LOWER EXTREMITY N/A 07/08/2020   Procedure: ABDOMINAL AORTOGRAM W/LOWER EXTREMITY;  Surgeon: Angelia Mould, MD;  Location: [place] [facility];  Service: Cardiovascular;  Laterality: N/A;  . AV FISTULA PLACEMENT Left  07/11/2020   Procedure: LEFT FIRST STAGE BRACHIOBASILIC UPPER EXTREMITY ARTERIOVENOUS (AV) FISTULA CREATION;  Surgeon: [name], MD;  Location: [place];  Service: Vascular;  Laterality: Left;  . Cardiac MRI  01/22/2020   Normal LV size and function.  Moderate focal basal septal hypertrophy.  No S.A.M.  LVEF 61%.  RVEF  66%.  Large mobile mass in the left atrium attached to the interatrial septum-does not appear to thrombus characteristics consistent with left atrial myxoma.  . Chest CTA  06/2019   Multiple small pseudoaneurysmal projections of the dominant aorta.  Distal abdominal aortic aneurysm 4.5 x 4.7 cm.  Greatest AP dimension of the infrarenal aorta is 4.9.  No evidence of thoracic aortic aneurysm or dissection.  Brief segment of proximal IMA occlusion.  Large geographic filling defect of the left atrium with associated dense radiopaque material.  Aortic atherosclerosis and emphysema  . COLONOSCOPY W/ POLYPECTOMY    . CORONARY ARTERY BYPASS GRAFT     05/2020 at [facility]  . EYE SURGERY Bilateral    Cataract surgery  . FEMORAL-POPLITEAL BYPASS GRAFT Left 09/12/2020   Procedure: LEFT FEMORAL-ABOVE KNEE POPLITEAL ARTERY BYPASS GRAFT;  Surgeon: Angelia Mould, MD;  Location: [place];  Service: Vascular;  Laterality: Left;  . IR FLUORO GUIDE CV LINE RIGHT  07/06/2020  . IR US GUIDE VASC ACCESS RIGHT  07/06/2020  . TRANSTHORACIC ECHOCARDIOGRAM  07/2019    EF 60 to 65%.  GR 1 DD.  Elevated LVEDP.  Large size ill-defined echodensity in the left atrium suspicious for thrombus versus tumor.  Moderately dilated LA.  . TUBAL LIGATION      Prior to Admission medications   Medication Sig Start Date End Date Taking? Authorizing Provider  acetaminophen (TYLENOL) 500 MG tablet Take 500-1,000 mg by mouth every 6 (six) hours as needed for moderate pain.   Yes [provider]  amiodarone (PACERONE) 200 MG tablet Take 200 mg by mouth daily. 06/10/20 06/10/21 Yes [provider]   amLODipine (NORVASC) 5 MG tablet Take 5 mg by mouth daily. 06/10/20 06/10/21 Yes [provider]  apixaban (ELIQUIS) 5 MG TABS tablet Take 1 tablet (5 mg total) by mouth 2 (two) times daily. 12/10/19  Yes Leonie Man, MD  Ascorbic Acid (VITAMIN C) 1000 MG tablet Take 1,000 mg by mouth every evening.   Yes [provider]  aspirin EC 81 MG tablet Take 81 mg by mouth daily. Swallow whole.   Yes [provider]  atorvastatin (LIPITOR) 20 MG tablet Take 20 mg by mouth daily.   Yes [provider]  carvedilol (COREG) 6.25 MG tablet TAKE 1 TABLET (6.25 MG TOTAL) BY MOUTH TWO TIMES DAILY WITH A MEAL. Patient taking differently: Take 6.25 mg by mouth 2 (two) times daily with a meal. 07/15/20 07/15/21 Yes Pahwani, Einar Grad, MD  docusate sodium (COLACE) 100 MG capsule Take 100 mg by mouth daily as needed for mild constipation.   Yes [provider]  famotidine (PEPCID) 20 MG tablet Take 20 mg by mouth daily as needed for headache.   Yes [provider]  fluticasone (FLONASE) 50 MCG/ACT nasal spray Place 2 sprays into both nostrils as needed for allergies or rhinitis.   Yes [provider]  folic acid (FOLVITE) 161 MCG tablet Take 800 mcg by mouth daily.   Yes [provider]  glimepiride (AMARYL) 1 MG tablet Take 1 mg by mouth 2 (two) times daily. Hold if blood sugar is less than 120   Yes [provider]  glucose blood test strip 1 each by Other route as needed. Use as instructed   Yes [provider]  lidocaine-prilocaine (EMLA) cream Apply 1 application topically daily as needed (dialysis). 08/12/20  Yes [provider]  loperamide (IMODIUM A-D) 2 MG tablet Take 2 mg by mouth 4 (four)  times daily as needed for diarrhea or loose stools.   Yes [provider]  Methoxy PEG-Epoetin Beta (MIRCERA IJ) Mircera 09/20/20  Yes [provider]  montelukast (SINGULAIR) 10 MG tablet Take 10 mg by mouth daily as  needed (allergies).   Yes [provider]  oxyCODONE (OXY IR/ROXICODONE) 5 MG immediate release tablet Take 5-10 mg by mouth 2 (two) times daily as needed for moderate pain. 06/09/20  Yes [provider]  Vitamin D3 (VITAMIN D) 25 MCG tablet Take 2,000 Units by mouth every evening.   Yes [provider]    Current Facility-Administered Medications  Medication Dose Route Frequency Provider Last Rate Last Admin  . 0.9 %  sodium chloride infusion (Manually program via Guardrails IV Fluids)   Intravenous Once Simmons-Robinson, Makiera, MD      . 0.9 %  sodium chloride infusion (Manually program via Guardrails IV Fluids)   Intravenous Once Simmons-Robinson, Makiera, MD      . 0.9 %  sodium chloride infusion  10 mL/hr Intravenous Once Simmons-Robinson, Makiera, MD      . amiodarone (PACERONE) tablet 200 mg  200 mg Oral Daily Simmons-Robinson, Makiera, MD   200 mg at 09/30/20 0816  . atorvastatin (LIPITOR) tablet 20 mg  20 mg Oral Daily Simmons-Robinson, Makiera, MD   20 mg at 09/30/20 0855  . carvedilol (COREG) tablet 6.25 mg  6.25 mg Oral BID WC Simmons-Robinson, Makiera, MD   6.25 mg at 09/30/20 0855  . Chlorhexidine Gluconate Cloth 2 % PADS 6 each  6 each Topical Q0600 Collins, Samantha G, PA-C      . insulin aspart (novoLOG) injection 0-6 Units  0-6 Units Subcutaneous TID WC Simmons-Robinson, Makiera, MD      . oxyCODONE (Oxy IR/ROXICODONE) immediate release tablet 5 mg  5 mg Oral Q6H PRN Simmons-Robinson, Makiera, MD   5 mg at 09/30/20 8921  . pantoprazole (PROTONIX) EC tablet 40 mg  40 mg Oral Daily Simmons-Robinson, Makiera, MD   40 mg at 09/30/20 0855  . polyethylene glycol (MIRALAX / GLYCOLAX) packet 17 g  17 g Oral Daily PRN Simmons-Robinson, Makiera, MD        Allergies as of 09/29/2020 - Review Complete 09/29/2020  Allergen Reaction Noted  . Penicillins Other (See Comments) 04/01/2019    Family History  Problem Relation Age of Onset  . Diabetes Mother   .  Diabetes Father   . Heart attack Father 1  . CAD Father   . Hyperlipidemia Father   . Hypertension Father     Social History   Socioeconomic History  . Marital status: Widowed    Spouse name: Not on file  . Number of children: Not on file  . Years of education: Not on file  . Highest education level: Not on file  Occupational History  . Not on file  Tobacco Use  . Smoking status: Current Every Day Smoker    Packs/day: 0.50    Years: 50.00    Pack years: 25.00    Types: Cigarettes  . Smokeless tobacco: Never Used  Vaping Use  . Vaping Use: Never used  Substance and Sexual Activity  . Alcohol use: Not Currently  . Drug use: Never  . Sexual activity: Not on file  Other Topics Concern  . Not on file  Social History Narrative   She is a mother of 2-but her son died in 01-Nov-2019 of a drug overdose.   Denies alcohol use.      Patient  is widowed and lives with her daughter and several grandchildren locally in [place].     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     At baseline, she lives a sedentary lifestyle and does not exercise or get out of the house much at all.  She is limited by bilateral calf claudication.     She also experiences some exertional shortness of breath which is chronic and stable.       She smokes between 1 and 2 packs of cigarettes daily and has been smoking since she was age 53.     Social Determinants of Health   Financial Resource Strain: Not on file  Food Insecurity: Not on file  Transportation Needs: Not on file  Physical Activity: Not on file  Stress: Not on file  Social Connections: Not on file  Intimate Partner Violence: Not on file    Review of Systems: All systems reviewed and negative except where noted in HPI.  OBJECTIVE:    Physical Exam: Vital signs in last 24 hours: Temp:  [97.4 F (36.3 C)-99.6 F (37.6 C)] 98.5 F (36.9 C) (04/22 0900) Pulse Rate:  [65-80] 67 (04/22  0900) Resp:  [15-18] 18 (04/22 0600) BP: (97-135)/(33-58) 132/58 (04/22 0900) SpO2:  [93 %-100 %] 100 % (04/22 0900) Weight:  [64 kg] 64 kg (04/22 0415) Last BM Date: 09/29/20 General:   Alert  female in NAD Psych:  Pleasant, cooperative. Normal mood and affect. Eyes:  Pupils equal, sclera clear, no icterus.   Conjunctiva pink. Ears:  Normal auditory acuity. Nose:  No deformity, discharge,  or lesions. Neck:  Supple; no masses Lungs:  Clear throughout to auscultation.   No wheezes, crackles, or rhonchi.  Heart:  Regular rate, + murmur, 1+ bilateral pedal edema. Necrotic appearing toes on both feet / right heel Abdomen:  Soft, non-distended, nontender, BS active, no palp mass   Rectal:  Deferred  Msk:  Symmetrical without gross deformities. . Neurologic:  Alert and  oriented x4;  grossly normal neurologically. Skin:  Intact without significant lesions or rashes.  Filed Weights   09/30/20 0415  Weight: 64 kg    Scheduled inpatient medications . sodium chloride   Intravenous Once  . sodium chloride   Intravenous Once  . amiodarone  200 mg Oral Daily  . atorvastatin  20 mg Oral Daily  . carvedilol  6.25 mg Oral BID WC  . Chlorhexidine Gluconate Cloth  6 each Topical Q0600  . insulin aspart  0-6 Units Subcutaneous TID WC  . pantoprazole  40 mg Oral Daily      Intake/Output from previous day: 04/21 0701 - 04/22 0700 In: 332 [Blood:332] Out: -  Intake/Output this shift: No intake/output data recorded.   Lab Results: Recent Labs    09/29/20 1242 09/30/20 0317  WBC 17.6* 12.5*  HGB 8.2* 6.4*  HCT 27.9* 20.2*  PLT 296 230   BMET Recent Labs    09/29/20 1351 09/30/20 0317  NA 135 133*  K 4.3 4.4  CL 98 96*  CO2 27 28  GLUCOSE 181* 100*  BUN 10 24*  CREATININE 1.59* 2.49*  CALCIUM 8.1* 7.8*   LFT Recent Labs    09/30/20 0317  PROT 4.7*  ALBUMIN 1.9*  1.9*  AST 12*  ALT 10  ALKPHOS 128*  BILITOT 0.5  BILIDIR 0.1  IBILI 0.4   PT/INR Recent Labs     09/30/20 0317  LABPROT 16.6*  INR 1.3*  Hepatitis Panel No results for input(s): HEPBSAG, HCVAB, HEPAIGM, HEPBIGM in the last 72 hours.   . CBC Latest Ref Rng & Units 09/30/2020 09/29/2020 09/13/2020  WBC 4.0 - 10.5 K/uL 12.5(H) 17.6(H) 12.2(H)  Hemoglobin 12.0 - 15.0 g/dL 6.4(LL) 8.2(L) 8.9(L)  Hematocrit 36.0 - 46.0 % 20.2(L) 27.9(L) 28.2(L)  Platelets 150 - 400 K/uL 230 296 186    . CMP Latest Ref Rng & Units 09/30/2020 09/29/2020 09/13/2020  Glucose 70 - 99 mg/dL 100(H) 181(H) 174(H)  BUN 8 - 23 mg/dL 24(H) 10 34(H)  Creatinine 0.44 - 1.00 mg/dL 2.49(H) 1.59(H) 4.02(H)  Sodium 135 - 145 mmol/L 133(L) 135 133(L)  Potassium 3.5 - 5.1 mmol/L 4.4 4.3 4.7  Chloride 98 - 111 mmol/L 96(L) 98 101  CO2 22 - 32 mmol/L 28 27 21(L)  Calcium 8.9 - 10.3 mg/dL 7.8(L) 8.1(L) 8.1(L)  Total Protein 6.5 - 8.1 g/dL 4.7(L) 6.0(L) -  Total Bilirubin 0.3 - 1.2 mg/dL 0.5 0.6 -  Alkaline Phos 38 - 126 U/L 128(H) 169(H) -  AST 15 - 41 U/L 12(L) 15 -  ALT 0 - 44 U/L 10 13 -   Studies/Results: DG Chest 1 View  Result Date: 09/29/2020 CLINICAL DATA:  Weakness. Dialysis patient. EXAM: CHEST  1 VIEW COMPARISON:  07/01/2020 FINDINGS: Normal sized heart. Stable post CABG changes. Interval right jugular double-lumen catheter with the lumen tips in the superior vena cava. Clear lungs with normal vascularity. Unremarkable bones. IMPRESSION: No acute abnormality. Electronically Signed   By: Claudie Revering M.D.   On: 09/29/2020 13:31    Active Problems:   ESRD on dialysis ([place])   S/P CABG (coronary artery bypass graft)   Symptomatic anemia   Current use of long term anticoagulation   Weakness    Tye Savoy, NP-C @  09/30/2020, 10:24 AM

## 2020-09-30 NOTE — TOC Benefit Eligibility Note (Signed)
Transition of Care (TOC) Benefit Eligibility Note    Patient Details  Name: Kathryn Hancock MRN: [id number] Date of Birth: 10/10/1950   Medication/Dose: NOVOLOG  0.6 UNITS  3 X A DAY  Covered?: Yes  Tier:  (TIER- 1 DRUG)  Prescription Coverage Preferred Pharmacy: [facility]  ,  [facility]  Spoke with Person/Company/Phone Number:: KATHIA  @  [employer] [facility] [employer] # [phone]  Co-Pay: $4.30  Prior Approval: No  Deductible: Met (OUT-OF-POCKET:UNMET)  Additional Notes: HUMALOY 0.6 UNITS 3 X A DAY : COVER-YES  CO-PAY- $27.20 TIER- 1 DRUG PRIOR APPROVAL- NO  and   TRADJENTA 5 MG  DAILY :COVER-YES, CO-PAY- $127.82 TIER- 1 DRUG ,PRIOR APPROVAL- NO    ,  Phone Number: 09/30/2020, 2:52 PM     

## 2020-09-30 NOTE — Progress Notes (Signed)
Notified by lab at this time of pt's Hemoglobin at 6.4. Will notify on call.

## 2020-10-01 ENCOUNTER — Encounter (HOSPITAL_COMMUNITY): Payer: Self-pay | Admitting: Family Medicine

## 2020-10-01 ENCOUNTER — Encounter (HOSPITAL_COMMUNITY)
Admission: EM | Disposition: A | Discharge status: Home Health | Payer: Self-pay | Source: Home / Self Care | Attending: Family Medicine

## 2020-10-01 ENCOUNTER — Inpatient Hospital Stay (HOSPITAL_COMMUNITY): Payer: Medicare Other | Admitting: Certified Registered Nurse Anesthetist

## 2020-10-01 DIAGNOSIS — N186 End stage renal disease: Secondary | ICD-10-CM | POA: Diagnosis not present

## 2020-10-01 DIAGNOSIS — I10 Essential (primary) hypertension: Secondary | ICD-10-CM

## 2020-10-01 DIAGNOSIS — K31819 Angiodysplasia of stomach and duodenum without bleeding: Secondary | ICD-10-CM

## 2020-10-01 DIAGNOSIS — K922 Gastrointestinal hemorrhage, unspecified: Secondary | ICD-10-CM

## 2020-10-01 DIAGNOSIS — E440 Moderate protein-calorie malnutrition: Secondary | ICD-10-CM

## 2020-10-01 DIAGNOSIS — Z7901 Long term (current) use of anticoagulants: Secondary | ICD-10-CM | POA: Diagnosis not present

## 2020-10-01 DIAGNOSIS — D649 Anemia, unspecified: Secondary | ICD-10-CM | POA: Diagnosis not present

## 2020-10-01 DIAGNOSIS — K2970 Gastritis, unspecified, without bleeding: Secondary | ICD-10-CM

## 2020-10-01 HISTORY — PX: HOT HEMOSTASIS: SHX5433

## 2020-10-01 HISTORY — PX: ESOPHAGOGASTRODUODENOSCOPY (EGD) WITH PROPOFOL: SHX5813

## 2020-10-01 HISTORY — PX: BIOPSY: SHX5522

## 2020-10-01 LAB — BPAM RBC
Blood Product Expiration Date: 202205162359
Blood Product Expiration Date: 202205202359
Blood Product Expiration Date: 202205202359
ISSUE DATE / TIME: 202204212144
ISSUE DATE / TIME: 202204220539
ISSUE DATE / TIME: 202204222313
Unit Type and Rh: 5100
Unit Type and Rh: 5100
Unit Type and Rh: 5100

## 2020-10-01 LAB — RENAL FUNCTION PANEL
Albumin: 1.9 g/dL — ABNORMAL LOW (ref 3.5–5.0)
Anion gap: 10 (ref 5–15)
BUN: 43 mg/dL — ABNORMAL HIGH (ref 8–23)
CO2: 23 mmol/L (ref 22–32)
Calcium: 8.2 mg/dL — ABNORMAL LOW (ref 8.9–10.3)
Chloride: 100 mmol/L (ref 98–111)
Creatinine, Ser: 3.55 mg/dL — ABNORMAL HIGH (ref 0.44–1.00)
GFR, Estimated: 14 mL/min — ABNORMAL LOW (ref >60)
Glucose, Bld: 118 mg/dL — ABNORMAL HIGH (ref 70–99)
Phosphorus: 4.4 mg/dL (ref 2.5–4.6)
Potassium: 4.8 mmol/L (ref 3.5–5.1)
Sodium: 133 mmol/L — ABNORMAL LOW (ref 135–145)

## 2020-10-01 LAB — TYPE AND SCREEN
ABO/RH(D): O POS
Antibody Screen: NEGATIVE
Unit division: 0
Unit division: 0
Unit division: 0

## 2020-10-01 LAB — HEPATIC FUNCTION PANEL
ALT: 11 U/L (ref 0–44)
AST: 12 U/L — ABNORMAL LOW (ref 15–41)
Albumin: 1.9 g/dL — ABNORMAL LOW (ref 3.5–5.0)
Alkaline Phosphatase: 134 U/L — ABNORMAL HIGH (ref 38–126)
Bilirubin, Direct: 0.2 mg/dL (ref 0.0–0.2)
Indirect Bilirubin: 0.6 mg/dL (ref 0.3–0.9)
Total Bilirubin: 0.8 mg/dL (ref 0.3–1.2)
Total Protein: 5 g/dL — ABNORMAL LOW (ref 6.5–8.1)

## 2020-10-01 LAB — GLUCOSE, CAPILLARY
Glucose-Capillary: 117 mg/dL — ABNORMAL HIGH (ref 70–99)
Glucose-Capillary: 76 mg/dL (ref 70–99)
Glucose-Capillary: 95 mg/dL (ref 70–99)
Glucose-Capillary: 122 mg/dL — ABNORMAL HIGH (ref 70–99)

## 2020-10-01 SURGERY — ESOPHAGOGASTRODUODENOSCOPY (EGD) WITH PROPOFOL
Anesthesia: Monitor Anesthesia Care

## 2020-10-01 MED ORDER — LIDOCAINE 2% (20 MG/ML) 5 ML SYRINGE
INTRAMUSCULAR | Status: DC | PRN
  Administered 2020-10-01: 60 mg via INTRAVENOUS

## 2020-10-01 MED ORDER — PROPOFOL 500 MG/50ML IV EMUL
INTRAVENOUS | Status: DC | PRN
  Administered 2020-10-01: 100 ug/kg/min via INTRAVENOUS

## 2020-10-01 MED ORDER — PROPOFOL 10 MG/ML IV BOLUS
INTRAVENOUS | Status: DC | PRN
  Administered 2020-10-01: 20 mg via INTRAVENOUS

## 2020-10-01 MED ORDER — HEPARIN SODIUM (PORCINE) 1000 UNIT/ML IJ SOLN
INTRAMUSCULAR | Status: AC
  Filled 2020-10-01: qty 4

## 2020-10-01 MED ORDER — OXYCODONE HCL 5 MG PO TABS
5.0000 mg | ORAL_TABLET | ORAL | Status: DC | PRN
  Administered 2020-10-01 – 2020-10-03 (×11): 5 mg via ORAL
  Filled 2020-10-01 (×11): qty 1

## 2020-10-01 MED ORDER — SODIUM CHLORIDE 0.9 % IV SOLN: INTRAVENOUS | Status: DC | PRN

## 2020-10-01 SURGICAL SUPPLY — 15 items

## 2020-10-01 NOTE — Transfer of Care (Signed)
Immediate Anesthesia Transfer of Care Note  Patient: Kathryn Hancock  Procedure(s) Performed: ESOPHAGOGASTRODUODENOSCOPY (EGD) WITH PROPOFOL (N/A ) BIOPSY HOT HEMOSTASIS (ARGON PLASMA COAGULATION/BICAP) (N/A )  Patient Location: PACU  Anesthesia Type:MAC  Level of Consciousness: awake, alert  and oriented  Airway & Oxygen Therapy: Patient Spontanous Breathing and Patient connected to nasal cannula oxygen  Post-op Assessment: Report given to RN and Post -op Vital signs reviewed and stable  Post vital signs: Reviewed and stable  Last Vitals:  Vitals Value Taken Time  BP 127/52 10/01/20 1058  Temp    Pulse 62 10/01/20 1059  Resp 16 10/01/20 1059  SpO2 90 % 10/01/20 1059  Vitals shown include unvalidated device data.  Last Pain:  Vitals:   10/01/20 1057  TempSrc:   PainSc: 0-No pain      Patients Stated Pain Goal: 3 (09/64/38 3818)  Complications: No complications documented.

## 2020-10-01 NOTE — Op Note (Addendum)
[facility] Patient Name: Kathryn Hancock Procedure Date : 10/01/2020 MRN: [id number] Attending MD: Jackquline Denmark , MD Date of Birth: 1950/06/15 CSN: [id number] Age: 70 Admit Type: Inpatient Procedure:                Upper GI endoscopy Indications:              Recent gastrointestinal bleeding Providers:                Jackquline Denmark, MD, Particia Nearing, RN, Ladona Ridgel,                            Technician Referring MD:             Dr Melina Copa. Medicines:                Monitored Anesthesia Care Complications:            No immediate complications. Estimated Blood Loss:     Estimated blood loss: none. Procedure:                Pre-Anesthesia Assessment:                           - Prior to the procedure, a History and Physical                            was performed, and patient medications and                            allergies were reviewed. The patient's tolerance of                            previous anesthesia was also reviewed. The risks                            and benefits of the procedure and the sedation                            options and risks were discussed with the patient.                            All questions were answered, and informed consent                            was obtained. Prior Anticoagulants: The patient has                            taken Eliquis (apixaban), last dose was more than 4                            weeks prior to procedure. ASA Grade Assessment: III                            - A patient with severe systemic disease. After  reviewing the risks and benefits, the patient was                            deemed in satisfactory condition to undergo the                            procedure.                           After obtaining informed consent, the endoscope was                            passed under direct vision. Throughout the                            procedure, the patient's blood pressure,  pulse, and                            oxygen saturations were monitored continuously. The                            GIF-H190 ([id number] Olympus gastroscope was                            introduced through the mouth, and advanced to the                            second part of duodenum. The upper GI endoscopy was                            accomplished without difficulty. The patient                            tolerated the procedure well. Scope In: Scope Out: Findings:      The examined esophagus was normal with well-defined Z-line at 40 cm,       examined by NBI.      Localized moderate inflammation with few erosions was found in the       gastric antrum. Biopsies were taken with a cold forceps for histology.      A single 4 mm angiodysplastic lesion with no bleeding was found in the       gastric body. Coagulation for bleeding prevention using argon plasma at       0.5 liters/minute and 20 watts was successful.      A small non-bleeding diverticulum was found in the second portion of the       duodenum. The remaining duodenum was normal. Biopsies for histology were       taken with a cold forceps to r/o celiac disease. Impression:               - Erosive gastritis.                           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 Treated with argon plasma coagulation                            (APC).                           - Non-bleeding incidental small duodenal                            diverticulum. Recommendation:           - Resume previous diet.                           - Continue present medications including Protonix                            40 mg p.o. once a day.                           - Await pathology results.                           - No ibuprofen, naproxen, or other non-steroidal                            anti-inflammatory drugs.                           - If medicine team feels Eliquis is needed, can                             restart in 2 days.                           - The findings and recommendations were discussed                            with the patient's daughter. She will follow-up                            with Dr. Melina Copa in 3 to 4 weeks.                           - Will sign off for now. Procedure Code(s):        --- Professional ---                           33007, 62, Esophagogastroduodenoscopy, flexible,                            transoral; with control of bleeding, any method                           43239, Esophagogastroduodenoscopy, flexible,                            transoral;  with biopsy, single or multiple Diagnosis Code(s):        --- Professional ---                           K29.70, Gastritis, unspecified, without bleeding                           K31.819, Angiodysplasia of stomach and duodenum                            without bleeding                           K92.2, Gastrointestinal hemorrhage, unspecified                           K57.10, Diverticulosis of small intestine without                            perforation or abscess without bleeding CPT copyright 2019 [employer] Association. All rights reserved. The codes documented in this report are preliminary and upon coder review may  be revised to meet current compliance requirements. Jackquline Denmark, MD 10/01/2020 11:02:33 AM This report has been signed electronically. Number of Addenda: 0

## 2020-10-01 NOTE — Progress Notes (Addendum)
Subjective: Seen in room, only complaint is hungry ,for dialysis after lunch, Noted recently back from endoscopy with noted erosive gastritis nonbleeding, AVM lesion nonbleeding in stomach treated with APC and small duodenum  Objective Vital signs in last 24 hours: Vitals:   10/01/20 1057 10/01/20 1112 10/01/20 1127 10/01/20 1128  BP: (!) 127/52 (!) 132/54 (!) 122/56 (!) 122/56  Pulse: 63 60 61 62  Resp: 15 14 15 12   Temp: (!) 97.5 F (36.4 C)  97.7 F (36.5 C)   TempSrc:      SpO2: 99% 100% 97% 99%  Weight:      Height:       Weight change:   Physical Exam: General: Alert, chronically ill female NAD Heart: RRR no MRG Lungs: CTA nonlabored Abdomen: Bowel sounds normoactive, soft NTND Extremities: 1+ bipedal edema with bilateral toes dry gangrene Dialysis Access: Right IJ PermCath, AV fistula maturing  [facility] 3 hrs 45 min, 180NRe Optiflux, BFR 400, DFR Manual 500 mL/min, EDW 59 (kg), Dialysate 3.0 K, 2.5 Ca, Received mircera 100 mcg on 09/20/20 No heparin, VDRA or binder  Problem/Plan: 1. Symptomatic anemia: Pt presenting with weakness and dark stools, Hgb 6.8 on 4/14 at outpatient unit and 8.2 in the ED,dropped to 6's overnight and receiving PRBC, 9.3 this a.m. 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 Hx of CAD, transfuse PRN for Hgb <8.  2. ESRD:Dialyzes on TTS schedule.  HD today on schedule   Planned for second stage AVF surgery on 4/25 if stable at that time. 3. Hypertension/volume:BP soft, not reaching EDW outpatient due to hypotension. + pedal edema. will attempt UF with HD as tolerated.  Admit chest x-ray NAD 4. Metabolic bone disease:Calcium controlled. Not on VDRA or phosphorus binders, follow labs. 5. Nutrition:Renal diet/fluid restrictions  recommended.  6. PAD: gangrene bilateral lower extremities, recent bypass by VVS.  7. Wounds: Pt reports painful wounds on hips/buttocks. Management per admitting team.   Ernest Haber, PA-C [place] [phone] 10/01/2020,12:59 PM  LOS: 1 day   Labs: Basic Metabolic Panel: Recent Labs  Lab 09/29/20 1351 09/30/20 0317 10/01/20 0812  NA 135 133* 133*  K 4.3 4.4 4.8  CL 98 96* 100  CO2 27 28 23   GLUCOSE 181* 100* 118*  BUN 10 24* 43*  CREATININE 1.59* 2.49* 3.55*  CALCIUM 8.1* 7.8* 8.2*  PHOS  --  3.1 4.4   Liver Function Tests: Recent Labs  Lab 09/29/20 1351 09/30/20 0317 10/01/20 0812 10/01/20 0817  AST 15 12*  --  12*  ALT 13 10  --  11  ALKPHOS 169* 128*  --  134*  BILITOT 0.6 0.5  --  0.8  PROT 6.0* 4.7*  --  5.0*  ALBUMIN 2.4* 1.9*  1.9* 1.9* 1.9*   No results for input(s): LIPASE, AMYLASE in the last 168 hours. No results for input(s): AMMONIA in the last 168 hours. CBC: Recent Labs  Lab 09/29/20 1242 09/30/20 0317 09/30/20 1611 10/01/20 0817  WBC 17.6* 12.5*  --  11.1*  NEUTROABS 14.7*  --   --   --   HGB 8.2* 6.4* 7.9* 9.3*  HCT 27.9* 20.2* 24.2* 28.3*  MCV 112.5* 103.1*  --  95.3  PLT 296 230  --  187   Cardiac Enzymes: No results for input(s): CKTOTAL, CKMB, CKMBINDEX,  TROPONINI in the last 168 hours. CBG: Recent Labs  Lab 09/30/20 1607 09/30/20 2134 10/01/20 0640 10/01/20 1130 10/01/20 1155  GLUCAP 98 142* 117* 76 95    Studies/Results: DG Chest 1 View  Result Date: 09/29/2020 CLINICAL DATA:  Weakness. Dialysis patient. EXAM: CHEST  1 VIEW COMPARISON:  07/01/2020 FINDINGS: Normal sized heart. Stable post CABG changes. Interval right jugular double-lumen catheter with the lumen tips in the superior vena cava. Clear lungs with normal vascularity. Unremarkable bones. IMPRESSION: No acute abnormality. Electronically Signed   By: Claudie Revering M.D.   On: 09/29/2020 13:31   Medications: . sodium chloride     .  (feeding supplement) PROSource Plus  30 mL Oral BID BM  . sodium chloride   Intravenous Once  . sodium chloride   Intravenous Once  . sodium chloride   Intravenous Once  . amiodarone  200 mg Oral Daily  . atorvastatin  20 mg Oral Daily  . carvedilol  6.25 mg Oral BID WC  . Chlorhexidine Gluconate Cloth  6 each Topical Q0600  . collagenase  1 application Topical Daily  . feeding supplement (GLUCERNA SHAKE)  237 mL Oral BID BM  . insulin aspart  0-6 Units Subcutaneous TID WC  . multivitamin  1 tablet Oral QHS  . pantoprazole  40 mg Oral BID

## 2020-10-01 NOTE — Anesthesia Preprocedure Evaluation (Signed)
Anesthesia Evaluation  Patient identified by MRN, date of birth, ID band Patient awake    Reviewed: Allergy & Precautions, NPO status , Patient's Chart, lab work & pertinent test results  Airway Mallampati: II       Dental   Pulmonary Current Smoker,    breath sounds clear to auscultation       Cardiovascular hypertension,  Rhythm:Regular Rate:Normal     Neuro/Psych    GI/Hepatic   Endo/Other  diabetes  Renal/GU      Musculoskeletal   Abdominal   Peds  Hematology   Anesthesia Other Findings   Reproductive/Obstetrics                             Anesthesia Physical Anesthesia Plan  ASA: III  Anesthesia Plan: MAC   Post-op Pain Management:    Induction:   PONV Risk Score and Plan: Propofol infusion and Ondansetron  Airway Management Planned: Natural Airway and Nasal Cannula  Additional Equipment:   Intra-op Plan:   Post-operative Plan:   Informed Consent: I have reviewed the patients History and Physical, chart, labs and discussed the procedure including the risks, benefits and alternatives for the proposed anesthesia with the patient or authorized representative who has indicated his/her understanding and acceptance.       Plan Discussed with:   Anesthesia Plan Comments:         Anesthesia Quick Evaluation

## 2020-10-01 NOTE — Plan of Care (Signed)
  Problem: Education: Goal: Knowledge of General Education information will improve Description Including pain rating scale, medication(s)/side effects and non-pharmacologic comfort measures Outcome: Progressing   

## 2020-10-01 NOTE — Progress Notes (Signed)
FPTS Interim Progress Note  Spoke to daughter, [name], over the phone regarding patient. Provided update and that patient is doing well, hopeful discharge. Daughter reports that she has not had the chance to visit patient in the hospital and did not have the opportunity to talk to patient over the phone as she was available when patient was in HD. Daughter shares that she does not feel comfortable with patient leaving today and will feel more comfortable about deciding anticipated discharge tomorrow when she comes by to see her mom in the morning. Daughter denies any further questions or concerns at this time.  Donney Dice, DO 10/01/2020, 7:54 PM PGY-1, [facility] pager [phone]

## 2020-10-01 NOTE — Progress Notes (Signed)
Family Medicine Teaching Service Daily Progress Note Intern Pager: [phone]  Patient name: Kathryn Hancock Medical record number: [id number] Date of birth: 12/13/1950 Age: 70 y.o. Gender: female  Primary Care Provider: Gala Lewandowsky, MD Consultants: Nephrology  Code Status: Full   Pt Overview and Major Events to Date:  4/21: Admitted   Assessment and Plan: Kathryn Hancock is a 70 y.o. female presenting with anemia . PMH is significant for T2DM, hypertension, CAD s/p CABG1, PAF (Eliquis), AAA, ESRD TThSa and HLD.   Anemia likely due to GI Bleed  Patient admitted for symptomatic anemia, although symptoms seem to have improved per patient, workup demonstrates otherwise. Presented with Hgb 6 after HD, in the ED improved to 8.2 without intervention. S/p 1 unit pRBC, Hgb 6.4 most recently. Multiple hematomas along with vasculopath history likely cause of ongoing anemia.  -GI consulted, appreciate recommendations  -s/p 3 units pRBC, another unit ordered -f/u post transfusion CBC, then monitor CBC -transfusion threshold 8  -IV protonix 40 mg  -PT/OT to evaluate  -Patient receiving endoscopy this morning 4/23  ESRD on HD Cr 2.49 and GFR 21 4/22. HD sessions Tues, Thurs and Sat since Jan of 2022. Last session was earlier on 4/21, patient reports completing a full session. Another fistula procedure that appears to be scheduled for 4/25 per chart review. -touch base with vascular surgery office in am  -nephrology consulted, appreciate of involvement and recs -monitor RFP  -Patient scheduled for HD today 4/23  Hyponatremia BMP ordered for this morning 4/23, however not drawn.. -monitor BMP  Paroxysmal atrial fibrillation, rate controlled Home medication includes eliquis 5 mg bid. HR 65 this morning. -hold home eliquis given concern for bleed  -cardiac monitoring  -monitor EKG - continue home coreg 6.25  Bilateral LE gangrene Noted to have bilateral LE gangrenous area along toes of both  feet. Daughter states that this seems to be improving after re-vascularization procedure earlier during the month of April 2022. Sensation is intact in these areas although very tender to light touch on exam.  -consult wound care  -may benefit from discussing with vascular while patient admitted to see if any intervention needed while inpatient  -patient to continue home pain management with oxycodone 70m, have increased frequency from every 6 hours as needed to every 4 hours as needed   Leukocytosis  Improved to 12.5 from admission WBC 17.6, likely reactive. No signs of infection and vitals stable. Expected to downtrend, continue to monitor. -monitor temp -pending UA -pending urine culture  -monitor CBC  Hyperglycemia in the setting of Type 2 DM Blood glucose 172 most recently, home medications include glimepiride 1 mg bid with instructions to hold if blood sugar <120.  -hold home med -very sensitive sliding scale -monitor CBGs  Elevated LFT Improving. Elevated alkaline phosphatase 128. -monitor CMP -consider RUQ ultrasound if alk phos continues to uptrend  Hypertension BP normotensive, home medications include amlodipine 5 mg and coreg 6.25 mg bid daily.  -hold home amlodipine given concern for hypotension - continue Coreg 6.25 mg twice daily for rate control   CAD  CABG x1  Left atrial myxoma  History of AAA Patient had left atrial myxoma removed in Dec of 2021. Home medications include aspirin 81 mg daily and atorvastatin 20 mg daily. -hold aspirin given possible bleed -continue home atorvastatin  -transfusion threshold 8  Tobacco use Smokes 1/2 ppd, down from 2.5 ppd. Declines nicotine patch, states that it is the habit that she likes and that a patch will not  help. -nicotine patch if desired -encourage cessation   Protein calorie malnutrition  Albumin 2.4 and total protein 6 on admission, secondary to poor oral intake. Patient endorsing decreased appetite over  the past few weeks. -renal diet -nutrition consult    FEN/GI: renal diet  PPx: SCDs    Status is: Observation  The patient remains OBS appropriate and will d/c before 2 midnights.  Dispo: The patient is from: Home              Anticipated d/c is to: Home              Patient currently is not medically stable to d/c.   Difficult to place patient No  Subjective:  Pleasant patient, reports having pain in her toes after she got up to walk to the bathroom.  Improved energy, shortness of breath after receiving 3 unit PRBC.  Patient due for EGD this morning with GI.  Patient also due for HD session today Saturday 4/23.  Objective: Temp:  [97.9 F (36.6 C)-98.9 F (37.2 C)] 98.5 F (36.9 C) (04/23 0339) Pulse Rate:  [65-77] 65 (04/23 0339) Resp:  [16-18] 16 (04/22 2345) BP: (116-135)/(51-62) 132/62 (04/23 0339) SpO2:  [97 %-100 %] 100 % (04/23 0339) Physical Exam: General: Patient sitting upright in bed, improvement in pallor, in no acute distress, very pleasant Cardiovascular: RRR, no murmurs or gallops auscultated  Respiratory: CTAB, no rales or rhonchi noted, breathing comfortably on room air Abdomen: soft, nontender, nondistended, BS+ Extremities: radial and distal pulses strong and equal bilaterally, 1+ LE edema noted bilaterally, pedal edema noted, gangrenous toes bilaterally stable from on admission  Psych: mood appropriate   Laboratory: Recent Labs  Lab 09/29/20 1242 09/30/20 0317 09/30/20 1611  WBC 17.6* 12.5*  --   HGB 8.2* 6.4* 7.9*  HCT 27.9* 20.2* 24.2*  PLT 296 230  --    Recent Labs  Lab 09/29/20 1351 09/30/20 0317  NA 135 133*  K 4.3 4.4  CL 98 96*  CO2 27 28  BUN 10 24*  CREATININE 1.59* 2.49*  CALCIUM 8.1* 7.8*  PROT 6.0* 4.7*  BILITOT 0.6 0.5  ALKPHOS 169* 128*  ALT 13 10  AST 15 12*  GLUCOSE 181* 100*    Imaging/Diagnostic Tests: No results found.  Daisy Floro, DO 10/01/2020, 5:34 AM PGY-3, [place]  Intern pager: [phone], text pages welcome

## 2020-10-01 NOTE — Interval H&P Note (Signed)
History and Physical Interval Note:  10/01/2020 9:58 AM  Kathryn Hancock  has presented today for surgery, with the diagnosis of anemia, dark stool.  The various methods of treatment have been discussed with the patient and family. After consideration of risks, benefits and other options for treatment, the patient has consented to  Procedure(s): ESOPHAGOGASTRODUODENOSCOPY (EGD) WITH PROPOFOL (N/A) as a surgical intervention.  The patient's history has been reviewed, patient examined, no change in status, stable for surgery.  I have reviewed the patient's chart and labs.  Questions were answered to the patient's satisfaction.     Jackquline Denmark

## 2020-10-01 NOTE — Progress Notes (Addendum)
Family Medicine Teaching Service Daily Progress Note Intern Pager: [phone]  Patient name: Shambhavi Salley Medical record number: [id number] Date of birth: 05/07/1951 Age: 70 y.o. Gender: female  Primary Care Provider: Gala Lewandowsky, MD Consultants: Nephrology Code Status: Full  Pt Overview and Major Events to Date:  04/21: Admitted 04/23:EGD  Assessment and Plan: Oneisha Henryis a 70 y.o.femalepresenting with anemia. PMH is significant for T2DM,hypertension,CAD s/p CABG1, PAF (Eliquis), AAA, ESRD TThSa and HLD.  Anemia likely due to GI Bleed Resolved.  Remains on room air and satting 97% without tachypnea. S/p 3u PRBCs. Hbg 9.6 today, improving.  EGD yesterday showed erosive gastritis. Single non-bleeding angiodysplastic lesion in the stomach and non bleeding small duodenal diverticulum.  Biopsy taken. -GI following, appreciate recommendations -Continue diet -Continue Protonix 40 mg daily -No NSAIDS -Can resume Eliquis 48 hrs from EGD per GI -Transfusion threshold <8 -Daily CBC  ESRD on HD HD TTS.  Last dialysis 04/23 for 3L output.  For AVF 04/25 per Nephro note.  Cr 2.18 today.  Had been scheduled for outpatient surgery tomorrow morning with vascular  0730 prior to admission. -Nephro following, appreciate recommendations -Follow up with vascular re AVF -Daily RFP  Hyponatremia Stable. Na 132.  Likely from dialysis yesterday -Monitor for acute mental changes  Paroxysmal atrial fibrillation, rate controlled Stable. Rate controlled -Continue home medications -Restart Eliquis 04/25 per GI  Bilateral LE gangrene Stable.  Wounds to sacrum, hips and toes. Had revascularization in 04/22. Received Oxy x 2 overnight.   -WOCN evaluated  -Continue home medication, Oxy 5 mg q4h  Leukocytosis Downtrending.  Urine positive for >100000 [place] pna, sensitivities pending -Discussed with pharmacy who recommended Fosfomycin x 1 dose, no renal dosing required -Daily  CBC  Hyperglycemia in the setting of Type 2 DM CBG 182 overnight. Has not received any insulin coverage. -Consider restarting home medication -Monitor CBG  Elevated LFT Stable.  No abdominal pain -Daily CMP -Consider RUQ u/s in continues to elevate  Hypertension Normotensive, with low diastolic pressures.   -Continue Coreg  -Continue Amlodipine when BP tolerates  CAD  CABG x1  Left atrial myxoma History of AAA Stable.   -Continue Atorvastatin -Continue to hold ASA -Transfusion threshold <8   Tobacco use -Nicotine patch at patients request  Protein calorie malnutrition  Low albumin.   -Daily CMP -renal diet  FEN/GI: renal PPx: SCDs  Status is: Inpatient  Remains inpatient appropriate because:Inpatient level of care appropriate due to severity of illness   Dispo: The patient is from: Home              Anticipated d/c is to: Home              Patient currently is medically stable to d/c.   Difficult to place patient No   Subjective:  No acute events overnight.  Denies any shortness of breath, chest pain, abdominal pain nausea or vomiting, or urinary symptoms.  Would like something for constipation.  No obvious signs of bleeding.    Objective: Temp:  [97.5 F (36.4 C)-99.2 F (37.3 C)] 99.2 F (37.3 C) (04/23 2135) Pulse Rate:  [60-79] 71 (04/23 2135) Resp:  [12-19] 19 (04/23 2135) BP: (112-188)/(48-71) 122/48 (04/23 2135) SpO2:  [97 %-100 %] 100 % (04/23 2135) Weight:  [60.9 kg-64.3 kg] 60.9 kg (04/23 1730)  Physical Exam:  General: 70 y.o. female in NAD Cardio: RRR no m/r/g.  LUA AVF  Lungs: CTAB, no wheezing, no rhonchi, no crackles, no IWOB on room air Abdomen: Soft,  non-tender to palpation, non-distended, positive bowel sounds Skin: warm and dry Extremities: No edema, necrotic toes bilaterally    Laboratory: Recent Labs  Lab 09/29/20 1242 09/30/20 0317 09/30/20 1611 10/01/20 0817  WBC 17.6* 12.5*  --  11.1*  HGB 8.2* 6.4* 7.9* 9.3*   HCT 27.9* 20.2* 24.2* 28.3*  PLT 296 230  --  187   Recent Labs  Lab 09/29/20 1351 09/30/20 0317 10/01/20 0812 10/01/20 0817  NA 135 133* 133*  --   K 4.3 4.4 4.8  --   CL 98 96* 100  --   CO2 27 28 23   --   BUN 10 24* 43*  --   CREATININE 1.59* 2.49* 3.55*  --   CALCIUM 8.1* 7.8* 8.2*  --   PROT 6.0* 4.7*  --  5.0*  BILITOT 0.6 0.5  --  0.8  ALKPHOS 169* 128*  --  134*  ALT 13 10  --  11  AST 15 12*  --  12*  GLUCOSE 181* 100* 118*  --       Imaging/Diagnostic Tests: No results found.  Carollee Leitz, MD 10/01/2020, 11:40 PM PGY-2, [facility] Intern pager: [phone], text pages welcome

## 2020-10-01 NOTE — Progress Notes (Signed)
Paged resident on cal to notify of delay in patient's labs being drawn.

## 2020-10-01 NOTE — Anesthesia Postprocedure Evaluation (Signed)
Anesthesia Post Note  Patient: Jazilyn Siegenthaler  Procedure(s) Performed: ESOPHAGOGASTRODUODENOSCOPY (EGD) WITH PROPOFOL (N/A ) BIOPSY HOT HEMOSTASIS (ARGON PLASMA COAGULATION/BICAP) (N/A )     Patient location during evaluation: Endoscopy Anesthesia Type: MAC Level of consciousness: awake and alert Pain management: pain level controlled Vital Signs Assessment: post-procedure vital signs reviewed and stable Respiratory status: spontaneous breathing, nonlabored ventilation, respiratory function stable and patient connected to nasal cannula oxygen Cardiovascular status: stable and blood pressure returned to baseline Postop Assessment: no apparent nausea or vomiting Anesthetic complications: no   No complications documented.  Last Vitals:  Vitals:   10/01/20 1500 10/01/20 1530  BP: (!) 116/58 (!) 119/14  Pulse: 70   Resp: 15   Temp:    SpO2: 100%     Last Pain:  Vitals:   10/01/20 1325  TempSrc: Oral  PainSc: 0-No pain                 Marice Angelino COKER

## 2020-10-01 NOTE — Anesthesia Procedure Notes (Signed)
Procedure Name: MAC Date/Time: 10/01/2020 10:43 AM Performed by: Dorthea Cove, CRNA Pre-anesthesia Checklist: Patient identified, Emergency Drugs available, Suction available and Patient being monitored Patient Re-evaluated:Patient Re-evaluated prior to induction Oxygen Delivery Method: Simple face mask Preoxygenation: Pre-oxygenation with 100% oxygen Induction Type: IV induction Placement Confirmation: positive ETCO2 and CO2 detector Dental Injury: Teeth and Oropharynx as per pre-operative assessment

## 2020-10-01 NOTE — Progress Notes (Signed)
Patients blood sugar was 76  In PACU so 4 oz of juice was given to patient to make sure her blood sugar will not drop. This information was given in report to [employer]. Blood sugar will be checked again on [place] and they will continue to monitor. Patient is in stable condition.

## 2020-10-01 NOTE — Progress Notes (Incomplete)
Family Medicine Teaching Service Daily Progress Note Intern Pager: [phone]  Patient name: Larri Brewton Medical record number: [id number] Date of birth: [birth year] Age: 70 y.o. Gender: female  Primary Care Provider: Gala Lewandowsky, MD Consultants: *** Code Status: ***  Pt Overview and Major Events to Date:  ***  Assessment and Plan:  ***  FEN/GI: *** PPx: ***   Status is: Inpatient  {Inpatient:23812}  Dispo: The patient is from: {From:23814}              Anticipated d/c is to: {To:23815}              Patient currently {Medically stable:23817}   Difficult to place patient {Yes/No:25151}        Subjective:  ***  Objective: Temp:  [97.5 F (36.4 C)-99.2 F (37.3 C)] 99.2 F (37.3 C) (04/23 2135) Pulse Rate:  [60-79] 71 (04/23 2135) Resp:  [12-19] 19 (04/23 2135) BP: (112-188)/(48-71) 122/48 (04/23 2135) SpO2:  [97 %-100 %] 100 % (04/23 2135) Weight:  [60.9 kg-64.3 kg] 60.9 kg (04/23 1730) Physical Exam: General: *** Cardiovascular: *** Respiratory: *** Abdomen: *** Extremities: ***  Laboratory: Recent Labs  Lab 09/29/20 1242 09/30/20 0317 09/30/20 1611 10/01/20 0817  WBC 17.6* 12.5*  --  11.1*  HGB 8.2* 6.4* 7.9* 9.3*  HCT 27.9* 20.2* 24.2* 28.3*  PLT 296 230  --  187   Recent Labs  Lab 09/29/20 1351 09/30/20 0317 10/01/20 0812 10/01/20 0817  NA 135 133* 133*  --   K 4.3 4.4 4.8  --   CL 98 96* 100  --   CO2 27 28 23   --   BUN 10 24* 43*  --   CREATININE 1.59* 2.49* 3.55*  --   CALCIUM 8.1* 7.8* 8.2*  --   PROT 6.0* 4.7*  --  5.0*  BILITOT 0.6 0.5  --  0.8  ALKPHOS 169* 128*  --  134*  ALT 13 10  --  11  AST 15 12*  --  12*  GLUCOSE 181* 100* 118*  --     ***  Imaging/Diagnostic Tests: Carollee Leitz, MD 10/01/2020, 11:40 PM PGY-***, [place] Intern pager: [phone]*, text pages welcome

## 2020-10-02 DIAGNOSIS — N186 End stage renal disease: Secondary | ICD-10-CM | POA: Diagnosis not present

## 2020-10-02 DIAGNOSIS — D649 Anemia, unspecified: Secondary | ICD-10-CM | POA: Diagnosis not present

## 2020-10-02 DIAGNOSIS — Z992 Dependence on renal dialysis: Secondary | ICD-10-CM | POA: Diagnosis not present

## 2020-10-02 DIAGNOSIS — Z7901 Long term (current) use of anticoagulants: Secondary | ICD-10-CM | POA: Diagnosis not present

## 2020-10-02 LAB — CBC
HCT: 28.3 % — ABNORMAL LOW (ref 36.0–46.0)
HCT: 30 % — ABNORMAL LOW (ref 36.0–46.0)
Hemoglobin: 9.3 g/dL — ABNORMAL LOW (ref 12.0–15.0)
Hemoglobin: 9.6 g/dL — ABNORMAL LOW (ref 12.0–15.0)
MCH: 31.3 pg (ref 26.0–34.0)
MCH: 31.3 pg (ref 26.0–34.0)
MCHC: 32.9 g/dL (ref 30.0–36.0)
MCHC: 32 g/dL (ref 30.0–36.0)
MCV: 95.3 fL (ref 80.0–100.0)
MCV: 97.7 fL (ref 80.0–100.0)
Platelets: 187 10*3/uL (ref 150–400)
Platelets: 202 10*3/uL (ref 150–400)
RBC: 2.97 MIL/uL — ABNORMAL LOW (ref 3.87–5.11)
RBC: 3.07 MIL/uL — ABNORMAL LOW (ref 3.87–5.11)
RDW: 21.7 % — ABNORMAL HIGH (ref 11.5–15.5)
RDW: 21.5 % — ABNORMAL HIGH (ref 11.5–15.5)
WBC: 11.1 10*3/uL — ABNORMAL HIGH (ref 4.0–10.5)
WBC: 10.8 10*3/uL — ABNORMAL HIGH (ref 4.0–10.5)
nRBC: 0 % (ref 0.0–0.2)
nRBC: 0 % (ref 0.0–0.2)

## 2020-10-02 LAB — GLUCOSE, CAPILLARY
Glucose-Capillary: 152 mg/dL — ABNORMAL HIGH (ref 70–99)
Glucose-Capillary: 137 mg/dL — ABNORMAL HIGH (ref 70–99)
Glucose-Capillary: 139 mg/dL — ABNORMAL HIGH (ref 70–99)
Glucose-Capillary: 102 mg/dL — ABNORMAL HIGH (ref 70–99)
Glucose-Capillary: 145 mg/dL — ABNORMAL HIGH (ref 70–99)

## 2020-10-02 LAB — HEPATIC FUNCTION PANEL
ALT: 14 U/L (ref 0–44)
AST: 24 U/L (ref 15–41)
Albumin: 2 g/dL — ABNORMAL LOW (ref 3.5–5.0)
Alkaline Phosphatase: 154 U/L — ABNORMAL HIGH (ref 38–126)
Bilirubin, Direct: 0.1 mg/dL (ref 0.0–0.2)
Indirect Bilirubin: 0.5 mg/dL (ref 0.3–0.9)
Total Bilirubin: 0.6 mg/dL (ref 0.3–1.2)
Total Protein: 5.2 g/dL — ABNORMAL LOW (ref 6.5–8.1)

## 2020-10-02 LAB — RENAL FUNCTION PANEL
Albumin: 2 g/dL — ABNORMAL LOW (ref 3.5–5.0)
Anion gap: 8 (ref 5–15)
BUN: 21 mg/dL (ref 8–23)
CO2: 28 mmol/L (ref 22–32)
Calcium: 7.9 mg/dL — ABNORMAL LOW (ref 8.9–10.3)
Chloride: 96 mmol/L — ABNORMAL LOW (ref 98–111)
Creatinine, Ser: 2.18 mg/dL — ABNORMAL HIGH (ref 0.44–1.00)
GFR, Estimated: 24 mL/min — ABNORMAL LOW (ref >60)
Glucose, Bld: 182 mg/dL — ABNORMAL HIGH (ref 70–99)
Phosphorus: 3.2 mg/dL (ref 2.5–4.6)
Potassium: 4 mmol/L (ref 3.5–5.1)
Sodium: 132 mmol/L — ABNORMAL LOW (ref 135–145)

## 2020-10-02 LAB — URINE CULTURE: Culture: >=100000 — AB

## 2020-10-02 MED ORDER — SENNA 8.6 MG PO TABS
1.0000 | ORAL_TABLET | Freq: Every day | ORAL | Status: DC
  Administered 2020-10-02: 8.6 mg via ORAL
  Filled 2020-10-02: qty 1

## 2020-10-02 MED ORDER — FOSFOMYCIN TROMETHAMINE 3 G PO PACK
3.0000 g | PACK | Freq: Once | ORAL | Status: DC
  Filled 2020-10-02 (×2): qty 3

## 2020-10-02 MED ORDER — ACETAMINOPHEN 325 MG PO TABS
650.0000 mg | ORAL_TABLET | Freq: Four times a day (QID) | ORAL | Status: DC | PRN
  Filled 2020-10-02: qty 2

## 2020-10-02 MED ORDER — DICLOFENAC SODIUM 1 % EX GEL
2.0000 g | Freq: Three times a day (TID) | CUTANEOUS | Status: DC
  Administered 2020-10-03: 2 g via TOPICAL
  Filled 2020-10-02: qty 100

## 2020-10-02 MED ORDER — VANCOMYCIN HCL 1000 MG/200ML IV SOLN
1000.0000 mg | INTRAVENOUS | Status: AC
  Administered 2020-10-03: 1000 mg via INTRAVENOUS
  Filled 2020-10-02: qty 200

## 2020-10-02 MED ORDER — LIDOCAINE 5 % EX PTCH
1.0000 | MEDICATED_PATCH | Freq: Every day | CUTANEOUS | Status: DC
  Administered 2020-10-03: 1 via TRANSDERMAL
  Filled 2020-10-02: qty 1

## 2020-10-02 MED ORDER — POLYETHYLENE GLYCOL 3350 17 G PO PACK
17.0000 g | PACK | Freq: Every day | ORAL | Status: DC
  Administered 2020-10-02: 17 g via ORAL
  Filled 2020-10-02: qty 1

## 2020-10-02 NOTE — Progress Notes (Signed)
Spoke with Dr Luan Pulling, Vascular.  Patient will have AVF procedure as previously scheduled tomorrow am.  Once procedure completed likely can be discharged and primary team will be happy to discharge patient to ensure outpatient follow up planning.  Appreciate vascular for partaking in patients care. NPO after midnight Recent COVID testing on admission negative.  Carollee Leitz, MD Family Medicine Residency

## 2020-10-02 NOTE — Plan of Care (Signed)
  Problem: Education: Goal: Knowledge of General Education information will improve Description: Including pain rating scale, medication(s)/side effects and non-pharmacologic comfort measures 10/02/2020 1842 by Peter Minium, RN Outcome: Progressing 10/02/2020 1841 by Peter Minium, RN Outcome: Progressing   Problem: Education: Goal: Knowledge of General Education information will improve Description: Including pain rating scale, medication(s)/side effects and non-pharmacologic comfort measures Outcome: Progressing   Problem: Clinical Measurements: Goal: Ability to maintain clinical measurements within normal limits will improve Outcome: Progressing Goal: Respiratory complications will improve Outcome: Progressing   Problem: Activity: Goal: Risk for activity intolerance will decrease Outcome: Progressing   Problem: Coping: Goal: Level of anxiety will decrease Outcome: Progressing

## 2020-10-02 NOTE — Progress Notes (Signed)
Subjective: No complaints this a.m. said tolerated dialysis yesterday,  Objective Vital signs in last 24 hours: Vitals:   10/02/20 0025 10/02/20 0251 10/02/20 0620 10/02/20 0900  BP: (!) 111/48  (!) 119/51 (!) 119/49  Pulse: 69  67 69  Resp: 17 18 16 14   Temp: 98.3 F (36.8 C)  98.1 F (36.7 C) 98.3 F (36.8 C)  TempSrc: Oral  Oral Oral  SpO2: 97%  95% 98%  Weight:      Height:       Weight change:   Physical Exam: General: Alert, chronically ill female NAD Heart: RRR no MRG Lungs: CTA nonlabored Abdomen: Bowel sounds normoactive, soft NTND Extremities: Trace bipedal edema with bilateral toes dry gangrene Dialysis Access: Right IJ PermCath, left upper arm AV fistula positive bruit maturing  [facility] 45 [facility], right IJ PermCath, left upper arm AV fistula maturing, DFR Manual 500 mL/min, EDW 59 (kg), Dialysate 3.0 K, 2.5 Ca, Received mircera 100 mcg on 09/20/20 No heparin, VDRA or binder  Problem/Plan: 1. Symptomatic anemia: Pt presenting with weakness and dark stools, Hgb 6.8 on 4/14 at outpatient unit and 8.2 in the ED,dropped to 6's overnight and receiving PRBC, 9.3 this a.m. 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 Hx of CAD, transfuse PRN for Hgb <8. 2. ESRD:Dialyzes on TTS schedule.  HD today on schedule   Planned for second stage AVF surgery on 4/25 if stable at that time, noted primary team said they would notify [facility] on admit 3. Hypertension/volume:BP soft, not reaching EDW outpatient due to hypotension. + pedal edema. will attempt UF with HD as tolerated.  Admit chest x-ray NAD 4. Metabolic bone disease:Calcium controlled. Not on VDRA or phosphorus binders, follow labs. 5. Nutrition:Renal diet/fluid restrictions recommended.  6. PAD:  gangrene bilateral lower extremities, recent bypass by [facility].  7. Wounds: Pt reports painful wounds on hips/buttocks. Management per admitting team.  Ernest Haber, PA-C [facility] [phone] 10/02/2020,10:24 AM  LOS: 2 days   Labs: Basic Metabolic Panel: Recent Labs  Lab 09/30/20 0317 10/01/20 0812 10/02/20 0252  NA 133* 133* 132*  K 4.4 4.8 4.0  CL 96* 100 96*  CO2 28 23 28   GLUCOSE 100* 118* 182*  BUN 24* 43* 21  CREATININE 2.49* 3.55* 2.18*  CALCIUM 7.8* 8.2* 7.9*  PHOS 3.1 4.4 3.2   Liver Function Tests: Recent Labs  Lab 09/30/20 0317 10/01/20 0812 10/01/20 0817 10/02/20 0252  AST 12*  --  12* 24  ALT 10  --  11 14  ALKPHOS 128*  --  134* 154*  BILITOT 0.5  --  0.8 0.6  PROT 4.7*  --  5.0* 5.2*  ALBUMIN 1.9*  1.9* 1.9* 1.9* 2.0*  2.0*   No results for input(s): LIPASE, AMYLASE in the last 168 hours. No results for input(s): AMMONIA in the last 168 hours. CBC: Recent Labs  Lab 09/29/20 1242 09/30/20 0317 09/30/20 1611 10/01/20 0817 10/02/20 0252  WBC 17.6* 12.5*  --  11.1* 10.8*  NEUTROABS 14.7*  --   --   --   --   HGB 8.2* 6.4* 7.9* 9.3* 9.6*  HCT 27.9* 20.2* 24.2* 28.3* 30.0*  MCV 112.5* 103.1*  --  95.3 97.7  PLT 296 230  --  187 202   Cardiac Enzymes: No results for  input(s): CKTOTAL, CKMB, CKMBINDEX, TROPONINI in the last 168 hours. CBG: Recent Labs  Lab 10/01/20 1130 10/01/20 1155 10/01/20 1818 10/01/20 2142 10/02/20 0642  GLUCAP 76 95 122* 152* 137*    Studies/Results: No results found. Medications: . sodium chloride     . (feeding supplement) PROSource Plus  30 mL Oral BID BM  . sodium chloride   Intravenous Once  . sodium chloride   Intravenous Once  . sodium chloride   Intravenous Once  . amiodarone  200 mg Oral Daily  . atorvastatin  20 mg Oral Daily  . carvedilol  6.25 mg Oral BID WC  . Chlorhexidine Gluconate Cloth  6 each Topical Q0600  . collagenase  1 application Topical Daily  . feeding  supplement (GLUCERNA SHAKE)  237 mL Oral BID BM  . fosfomycin  3 g Oral Once  . insulin aspart  0-6 Units Subcutaneous TID WC  . multivitamin  1 tablet Oral QHS  . pantoprazole  40 mg Oral BID  . polyethylene glycol  17 g Oral Daily  . senna  1 tablet Oral Daily

## 2020-10-02 NOTE — Anesthesia Preprocedure Evaluation (Addendum)
Anesthesia Evaluation  Patient identified by MRN, date of birth, ID band Patient awake    Reviewed: Allergy & Precautions, NPO status , Patient's Chart, lab work & pertinent test results, reviewed documented beta blocker date and time   History of Anesthesia Complications (+) PONV and history of anesthetic complications  Airway Mallampati: II  TM Distance: >3 FB Neck ROM: Full    Dental  (+) Edentulous Upper, Edentulous Lower   Pulmonary Current Smoker and Patient abstained from smoking.,    Pulmonary exam normal        Cardiovascular hypertension, Pt. on medications and Pt. on home beta blockers + CAD, + CABG and + Peripheral Vascular Disease  Normal cardiovascular exam+ dysrhythmias Atrial Fibrillation    '22 TTE - EF 55 to 60%. Grade I diastolic dysfunction (impaired relaxation). RV systolic function is mildly reduced. Trivial MR and AI.   '21 TEE - Normal Biventricular function. LA dilation. Trace MR. Mild eccentric AI from L[facility]/N[facility].     Neuro/Psych PSYCHIATRIC DISORDERS Depression negative neurological ROS     GI/Hepatic Neg liver ROS, GERD  Controlled and Medicated,  Endo/Other  diabetes, Type 2, Oral Hypoglycemic Agents Na 132 Ca 7.9   Renal/GU ESRF and DialysisRenal disease     Musculoskeletal negative musculoskeletal ROS (+)   Abdominal   Peds  Hematology  (+) Blood dyscrasia, anemia ,   Anesthesia Other Findings Covid test negative   Reproductive/Obstetrics                            Anesthesia Physical Anesthesia Plan  ASA: III  Anesthesia Plan: General   Post-op Pain Management:    Induction: Intravenous  PONV Risk Score and Plan: 4 or greater and Treatment may vary due to age or medical condition, Ondansetron, Dexamethasone and Midazolam  Airway Management Planned: LMA  Additional Equipment: None  Intra-op Plan:   Post-operative Plan: Extubation in  OR  Informed Consent: I have reviewed the patients History and Physical, chart, labs and discussed the procedure including the risks, benefits and alternatives for the proposed anesthesia with the patient or authorized representative who has indicated his/her understanding and acceptance.     Dental advisory given  Plan Discussed with: CRNA and Anesthesiologist  Anesthesia Plan Comments:       Anesthesia Quick Evaluation

## 2020-10-03 ENCOUNTER — Inpatient Hospital Stay (HOSPITAL_COMMUNITY): Payer: Medicare Other | Admitting: Anesthesiology

## 2020-10-03 ENCOUNTER — Encounter (HOSPITAL_COMMUNITY): Payer: Self-pay | Admitting: Family Medicine

## 2020-10-03 ENCOUNTER — Encounter (HOSPITAL_COMMUNITY)
Admission: EM | Disposition: A | Discharge status: Home Health | Payer: Self-pay | Source: Home / Self Care | Attending: Family Medicine

## 2020-10-03 ENCOUNTER — Ambulatory Visit (HOSPITAL_COMMUNITY): Admission: RE | Admit: 2020-10-03 | Payer: Medicare Other | Source: Home / Self Care | Admitting: Vascular Surgery

## 2020-10-03 DIAGNOSIS — N185 Chronic kidney disease, stage 5: Secondary | ICD-10-CM

## 2020-10-03 HISTORY — PX: BASCILIC VEIN TRANSPOSITION: SHX5742

## 2020-10-03 LAB — BASIC METABOLIC PANEL
Anion gap: 15 (ref 5–15)
BUN: 43 mg/dL — ABNORMAL HIGH (ref 8–23)
CO2: 22 mmol/L (ref 22–32)
Calcium: 7.9 mg/dL — ABNORMAL LOW (ref 8.9–10.3)
Chloride: 93 mmol/L — ABNORMAL LOW (ref 98–111)
Creatinine, Ser: 3.81 mg/dL — ABNORMAL HIGH (ref 0.44–1.00)
GFR, Estimated: 12 mL/min — ABNORMAL LOW (ref >60)
Glucose, Bld: 309 mg/dL — ABNORMAL HIGH (ref 70–99)
Potassium: 4.5 mmol/L (ref 3.5–5.1)
Sodium: 130 mmol/L — ABNORMAL LOW (ref 135–145)

## 2020-10-03 LAB — CBC
HCT: 26.8 % — ABNORMAL LOW (ref 36.0–46.0)
Hemoglobin: 8.6 g/dL — ABNORMAL LOW (ref 12.0–15.0)
MCH: 31.7 pg (ref 26.0–34.0)
MCHC: 32.1 g/dL (ref 30.0–36.0)
MCV: 98.9 fL (ref 80.0–100.0)
Platelets: 186 10*3/uL (ref 150–400)
RBC: 2.71 MIL/uL — ABNORMAL LOW (ref 3.87–5.11)
RDW: 19.9 % — ABNORMAL HIGH (ref 11.5–15.5)
WBC: 9.3 10*3/uL (ref 4.0–10.5)
nRBC: 0 % (ref 0.0–0.2)

## 2020-10-03 LAB — HEPATIC FUNCTION PANEL
ALT: 12 U/L (ref 0–44)
AST: 13 U/L — ABNORMAL LOW (ref 15–41)
Albumin: 2 g/dL — ABNORMAL LOW (ref 3.5–5.0)
Alkaline Phosphatase: 120 U/L (ref 38–126)
Bilirubin, Direct: 0.2 mg/dL (ref 0.0–0.2)
Indirect Bilirubin: 0.4 mg/dL (ref 0.3–0.9)
Total Bilirubin: 0.6 mg/dL (ref 0.3–1.2)
Total Protein: 5 g/dL — ABNORMAL LOW (ref 6.5–8.1)

## 2020-10-03 LAB — GLUCOSE, CAPILLARY
Glucose-Capillary: 109 mg/dL — ABNORMAL HIGH (ref 70–99)
Glucose-Capillary: 102 mg/dL — ABNORMAL HIGH (ref 70–99)
Glucose-Capillary: 151 mg/dL — ABNORMAL HIGH (ref 70–99)
Glucose-Capillary: 290 mg/dL — ABNORMAL HIGH (ref 70–99)

## 2020-10-03 LAB — RENAL FUNCTION PANEL
Albumin: 1.9 g/dL — ABNORMAL LOW (ref 3.5–5.0)
Anion gap: 10 (ref 5–15)
BUN: 43 mg/dL — ABNORMAL HIGH (ref 8–23)
CO2: 22 mmol/L (ref 22–32)
Calcium: 8.1 mg/dL — ABNORMAL LOW (ref 8.9–10.3)
Chloride: 97 mmol/L — ABNORMAL LOW (ref 98–111)
Creatinine, Ser: 3.68 mg/dL — ABNORMAL HIGH (ref 0.44–1.00)
GFR, Estimated: 13 mL/min — ABNORMAL LOW (ref >60)
Glucose, Bld: 316 mg/dL — ABNORMAL HIGH (ref 70–99)
Phosphorus: 5.3 mg/dL — ABNORMAL HIGH (ref 2.5–4.6)
Potassium: 4.5 mmol/L (ref 3.5–5.1)
Sodium: 129 mmol/L — ABNORMAL LOW (ref 135–145)

## 2020-10-03 SURGERY — TRANSPOSITION, VEIN, BASILIC
Anesthesia: General | Laterality: Left

## 2020-10-03 MED ORDER — OXYCODONE-ACETAMINOPHEN 5-325 MG PO TABS: 1.0000 | ORAL_TABLET | ORAL | Status: DC | PRN

## 2020-10-03 MED ORDER — EPHEDRINE SULFATE-NACL 50-0.9 MG/10ML-% IV SOSY
PREFILLED_SYRINGE | INTRAVENOUS | Status: DC | PRN
  Administered 2020-10-03 (×2): 5 mg via INTRAVENOUS
  Administered 2020-10-03: 10 mg via INTRAVENOUS

## 2020-10-03 MED ORDER — FENTANYL CITRATE (PF) 250 MCG/5ML IJ SOLN
INTRAMUSCULAR | Status: DC | PRN
  Administered 2020-10-03 (×2): 25 ug via INTRAVENOUS

## 2020-10-03 MED ORDER — DEXAMETHASONE SODIUM PHOSPHATE 10 MG/ML IJ SOLN
INTRAMUSCULAR | Status: DC | PRN
  Administered 2020-10-03: 5 mg via INTRAVENOUS

## 2020-10-03 MED ORDER — SODIUM CHLORIDE 0.9 % IV SOLN
INTRAVENOUS | Status: AC
  Filled 2020-10-03: qty 1.2

## 2020-10-03 MED ORDER — PROPOFOL 10 MG/ML IV BOLUS
INTRAVENOUS | Status: DC | PRN
  Administered 2020-10-03: 100 mg via INTRAVENOUS

## 2020-10-03 MED ORDER — FENTANYL CITRATE (PF) 100 MCG/2ML IJ SOLN: 25.0000 ug | INTRAMUSCULAR | Status: DC | PRN

## 2020-10-03 MED ORDER — FOSFOMYCIN TROMETHAMINE 3 G PO PACK
3.0000 g | PACK | Freq: Once | ORAL | Status: AC
  Administered 2020-10-03: 3 g via ORAL
  Filled 2020-10-03: qty 3

## 2020-10-03 MED ORDER — SODIUM CHLORIDE 0.9 % IV SOLN: INTRAVENOUS | Status: DC

## 2020-10-03 MED ORDER — SODIUM CHLORIDE 0.9 % IV SOLN
INTRAVENOUS | Status: DC | PRN
  Administered 2020-10-03: 500 mL

## 2020-10-03 MED ORDER — PHENYLEPHRINE HCL-NACL 10-0.9 MG/250ML-% IV SOLN
INTRAVENOUS | Status: DC | PRN
  Administered 2020-10-03: 50 ug/min via INTRAVENOUS

## 2020-10-03 MED ORDER — ONDANSETRON HCL 4 MG/2ML IJ SOLN: 4.0000 mg | Freq: Once | INTRAMUSCULAR | Status: DC | PRN

## 2020-10-03 MED ORDER — ORAL CARE MOUTH RINSE: 15.0000 mL | Freq: Once | OROMUCOSAL | Status: AC

## 2020-10-03 MED ORDER — OXYCODONE HCL 5 MG/5ML PO SOLN: 5.0000 mg | Freq: Once | ORAL | Status: DC | PRN

## 2020-10-03 MED ORDER — LIDOCAINE 2% (20 MG/ML) 5 ML SYRINGE
INTRAMUSCULAR | Status: DC | PRN
  Administered 2020-10-03: 60 mg via INTRAVENOUS

## 2020-10-03 MED ORDER — ONDANSETRON HCL 4 MG/2ML IJ SOLN
INTRAMUSCULAR | Status: DC | PRN
  Administered 2020-10-03: 4 mg via INTRAVENOUS

## 2020-10-03 MED ORDER — CHLORHEXIDINE GLUCONATE 0.12 % MT SOLN
OROMUCOSAL | Status: AC
  Administered 2020-10-03: 15 mL via OROMUCOSAL
  Filled 2020-10-03: qty 15

## 2020-10-03 MED ORDER — LIDOCAINE-EPINEPHRINE (PF) 1 %-1:200000 IJ SOLN
INTRAMUSCULAR | Status: DC | PRN
  Administered 2020-10-03: 20 mL

## 2020-10-03 MED ORDER — PROTAMINE SULFATE 10 MG/ML IV SOLN
INTRAVENOUS | Status: DC | PRN
  Administered 2020-10-03: 40 mg via INTRAVENOUS

## 2020-10-03 MED ORDER — COLLAGENASE 250 UNIT/GM EX OINT
1.0000 | TOPICAL_OINTMENT | CUTANEOUS | 1 refills | Status: DC
Start: 2020-10-03 — End: 2020-11-25

## 2020-10-03 MED ORDER — HEPARIN SODIUM (PORCINE) 1000 UNIT/ML IJ SOLN
INTRAMUSCULAR | Status: DC | PRN
  Administered 2020-10-03: 6000 [IU] via INTRAVENOUS

## 2020-10-03 MED ORDER — LIDOCAINE HCL (PF) 1 % IJ SOLN
INTRAMUSCULAR | Status: AC
  Filled 2020-10-03: qty 30

## 2020-10-03 MED ORDER — CHLORHEXIDINE GLUCONATE 0.12 % MT SOLN: 15.0000 mL | Freq: Once | OROMUCOSAL | Status: AC

## 2020-10-03 MED ORDER — 0.9 % SODIUM CHLORIDE (POUR BTL) OPTIME
TOPICAL | Status: DC | PRN
  Administered 2020-10-03: 1000 mL

## 2020-10-03 MED ORDER — MIDAZOLAM HCL 2 MG/2ML IJ SOLN
INTRAMUSCULAR | Status: DC | PRN
  Administered 2020-10-03: 2 mg via INTRAVENOUS

## 2020-10-03 MED ORDER — OXYCODONE HCL 5 MG PO TABS
5.0000 mg | ORAL_TABLET | Freq: Once | ORAL | Status: DC | PRN
Start: 2020-10-03 — End: 2020-10-03

## 2020-10-03 MED ORDER — CHLORHEXIDINE GLUCONATE CLOTH 2 % EX PADS: 6.0000 | MEDICATED_PAD | Freq: Every day | CUTANEOUS | Status: DC

## 2020-10-03 SURGICAL SUPPLY — 40 items
ARMBAND PINK RESTRICT EXTREMIT (MISCELLANEOUS) ×3 IMPLANT
CANISTER SUCT 3000ML PPV (MISCELLANEOUS) ×3 IMPLANT
CLIP VESOCCLUDE MED 24/CT (CLIP) ×3 IMPLANT
CLIP VESOCCLUDE SM WIDE 24/CT (CLIP) ×3 IMPLANT
COVER PROBE W GEL 5X96 (DRAPES) ×3 IMPLANT
COVER WAND RF STERILE (DRAPES) IMPLANT
DECANTER SPIKE VIAL GLASS SM (MISCELLANEOUS) ×30 IMPLANT
DERMABOND ADVANCED (GAUZE/BANDAGES/DRESSINGS) ×2
DERMABOND ADVANCED .7 DNX12 (GAUZE/BANDAGES/DRESSINGS) ×1 IMPLANT
ELECT REM PT RETURN 9FT ADLT (ELECTROSURGICAL) ×3
ELECTRODE REM PT RTRN 9FT ADLT (ELECTROSURGICAL) ×1 IMPLANT
GLOVE BIO SURGEON STRL SZ7.5 (GLOVE) ×3 IMPLANT
GLOVE SRG 8 PF TXTR STRL LF DI (GLOVE) ×2 IMPLANT
GLOVE SURG UNDER POLY LF SZ8 (GLOVE) ×4
GOWN STRL REUS W/ TWL LRG LVL3 (GOWN DISPOSABLE) ×2 IMPLANT
GOWN STRL REUS W/ TWL XL LVL3 (GOWN DISPOSABLE) ×2 IMPLANT
GOWN STRL REUS W/TWL LRG LVL3 (GOWN DISPOSABLE) ×4
GOWN STRL REUS W/TWL XL LVL3 (GOWN DISPOSABLE) ×4
HEMOSTAT SPONGE AVITENE ULTRA (HEMOSTASIS) IMPLANT
KIT BASIN OR (CUSTOM PROCEDURE TRAY) ×3 IMPLANT
KIT TURNOVER KIT B (KITS) ×3 IMPLANT
NEEDLE HYPO 25GX1X1/2 BEV (NEEDLE) ×3 IMPLANT
NS IRRIG 1000ML POUR BTL (IV SOLUTION) ×3 IMPLANT
PACK CV ACCESS (CUSTOM PROCEDURE TRAY) ×3 IMPLANT
PAD ARMBOARD 7.5X6 YLW CONV (MISCELLANEOUS) ×6 IMPLANT
SUT MNCRL AB 4-0 PS2 18 (SUTURE) ×9 IMPLANT
SUT PROLENE 6 0 BV (SUTURE) ×9 IMPLANT
SUT PROLENE 7 0 BV 1 (SUTURE) IMPLANT
SUT SILK 2 0 (SUTURE) ×2
SUT SILK 2 0 SH (SUTURE) IMPLANT
SUT SILK 2-0 18XBRD TIE 12 (SUTURE) ×1 IMPLANT
SUT SILK 3 0 (SUTURE) ×2
SUT SILK 3-0 18XBRD TIE 12 (SUTURE) ×1 IMPLANT
SUT VIC AB 2-0 CT1 27 (SUTURE)
SUT VIC AB 2-0 CT1 TAPERPNT 27 (SUTURE) IMPLANT
SUT VIC AB 3-0 SH 27 (SUTURE) ×8
SUT VIC AB 3-0 SH 27X BRD (SUTURE) ×4 IMPLANT
TOWEL GREEN STERILE (TOWEL DISPOSABLE) ×3 IMPLANT
UNDERPAD 30X36 HEAVY ABSORB (UNDERPADS AND DIAPERS) ×3 IMPLANT
WATER STERILE IRR 1000ML POUR (IV SOLUTION) ×3 IMPLANT

## 2020-10-03 NOTE — H&P (Signed)
History and Physical Interval Note:  10/03/2020 8:15 AM  Kathryn Hancock  has presented today for surgery, with the diagnosis of ESRD.  The various methods of treatment have been discussed with the patient and family. After consideration of risks, benefits and other options for treatment, the patient has consented to  Procedure(s): [place] LEFT (Left) as a surgical intervention.  The patient's history has been reviewed, patient examined, no change in status, stable for surgery.  I have reviewed the patient's chart and labs.  Questions were answered to the patient's satisfaction.    Left 2nd stage BVT.  Kathryn Hancock  POST OPERATIVE OFFICE NOTE    CC:  F/u for surgery  HPI:  This is a 70 y.o. female who is s/p left femoral to popliteal bypass on 09/12/2020 by Dr. Scot Dock.    Pt returns today for follow up.  Pt states her left foot feels better.  She states she does have some swelling in her feet.  She states that she has started dialysis.  When she went home on the Wed after surgery, she felt good.  After dialysis on Thursday, she felt weak and almost fell but her grandson lowered her to the ground.  The dialysis center [facility]) has adjusted her treatments and they feel that is why she is having more swelling.  She states she has some wounds on her hips as well as her tailbone.  She is scheduled on Monday for 2nd stage left BVT with Dr. Carlis Abbott.  She is currently dialyzing via [place].       Allergies  Allergen Reactions  . Penicillins Other (See Comments)    Childhood reaction.          Current Outpatient Medications  Medication Sig Dispense Refill  . acetaminophen (TYLENOL) 500 MG tablet Take 500-1,000 mg by mouth every 6 (six) hours as needed for moderate pain.    Marland Kitchen amiodarone (PACERONE) 200 MG tablet Take 200 mg by mouth daily.    Marland Kitchen amLODipine (NORVASC) 5 MG tablet Take 5 mg by mouth daily.    Marland Kitchen apixaban (ELIQUIS) 5 MG TABS tablet  Take 1 tablet (5 mg total) by mouth 2 (two) times daily. 60 tablet 6  . Ascorbic Acid (VITAMIN C) 1000 MG tablet Take 1,000 mg by mouth every evening.    Marland Kitchen aspirin EC 81 MG tablet Take 81 mg by mouth daily. Swallow whole.    Marland Kitchen atorvastatin (LIPITOR) 20 MG tablet Take 20 mg by mouth daily.    . carvedilol (COREG) 6.25 MG tablet TAKE 1 TABLET (6.25 MG TOTAL) BY MOUTH TWO TIMES DAILY WITH A MEAL. 60 tablet 0  . docusate sodium (COLACE) 100 MG capsule Take 100 mg by mouth daily as needed for mild constipation.    . famotidine (PEPCID) 20 MG tablet Take 20 mg by mouth daily as needed for headache.    . fluticasone (FLONASE) 50 MCG/ACT nasal spray Place 2 sprays into both nostrils as needed for allergies or rhinitis.    . folic acid (FOLVITE) 492 MCG tablet Take 800 mcg by mouth daily.    Marland Kitchen glimepiride (AMARYL) 1 MG tablet Take 1 mg by mouth 2 (two) times daily. Hold if blood sugar is less than 120    . glucose blood test strip 1 each by Other route as needed. Use as instructed    . lidocaine-prilocaine (EMLA) cream Apply 1 application topically daily as needed (dialysis).    Marland Kitchen loperamide (IMODIUM A-D) 2  MG tablet Take 2 mg by mouth 4 (four) times daily as needed for diarrhea or loose stools.    . Methoxy PEG-Epoetin Beta (MIRCERA IJ) Mircera    . montelukast (SINGULAIR) 10 MG tablet Take 10 mg by mouth daily as needed (allergies).    Marland Kitchen oxyCODONE (OXY IR/ROXICODONE) 5 MG immediate release tablet Take 5-10 mg by mouth 2 (two) times daily as needed for moderate pain.    . Vitamin D3 (VITAMIN D) 25 MCG tablet Take 2,000 Units by mouth every evening.    . sulfamethoxazole-trimethoprim (BACTRIM) 400-80 MG tablet sulfamethoxazole 400 mg-trimethoprim 80 mg tablet  TAKE 1 TABLET BY MOUTH TWICE DAILY     No current facility-administered medications for this visit.     ROS:  See HPI  Physical Exam:     Today's Vitals   09/28/20 1407  BP: (!) 114/55  Pulse: 74   Resp: 20  Temp: 97.7 F (36.5 C)  TempSrc: Temporal  Weight: 138 lb 14.4 oz (63 kg)  Height: 5\' 3"  (1.6 m)   Body mass index is 24.61 kg/m.   Incision:  Left groin incision healing nicely.  There is mild bloody drainage from the distal incision proximally Extremities:  Monophasic doppler signals left DP/PT and brisk biphasic peroneal doppler signals.  On the right, she has monophasic DP/PT/peroneal.      Assessment/Plan:  This is a 70 y.o. female who is s/p: left femoral to popliteal bypass on 09/12/2020 by Dr. Scot Dock.     -pt seen with Dr. Scot Dock.  He advised pt to try to keep legs elevated, however, she has a hard time with this given her wounds on hips and tailbone.  She will elevate as she can.   -pt with brisk biphasic peroneal doppler signal left foot.  -she has 2nd stage BVT scheduled on Monday 4/25.   -pt will come back to see Dr. Scot Dock in 3-4 weeks and be re-evaluated for right lower extremity bypass.   -bilateral wounds on lateral buttocks.  Pt does have palpable femoral pulses bilaterally.  ? Calciphylaxis given she in on HD or possible pressure related.  -continue asa/statin -discussed smoking cessation   Leontine Locket, [facility] [phone]   Clinic MD:  Scot Dock

## 2020-10-03 NOTE — Progress Notes (Signed)
Physical Therapy Treatment Patient Details Name: Kathryn Hancock MRN: [id number] DOB: Nov 09, 1950 Today's Date: 10/03/2020    History of Present Illness Pt is 70 yo female presenting with symptomatic anemia 09/29/20.  Hgb 6.0 at HD, 8.2 in ED, received unit PRBC, back to 6.4 4/22, received unit PRBC 4/22.  Pt with PMH  significant for T2DM, hypertension, CAD s/p CABG1, PAF (Eliquis), AAA, ESRD TThSa and HLD.  Pt s/p re-vascularization surgery ~2 weeks ago. Bil toes with dry gangrene.    PT Comments    Pt was at procedure all morning for her dialysis fistula site. Pt agreeable to work with PT however presenting with fatigue and flat affect. Pt continues with necrotic toes on bilat feet, L foot swelling accompanied by pain limiting ambulation tolerance. Pt with reports she has been very wiped after dialysis and has almost fallen several time trying to get in the house after HD, pt with 1 step to get up and ramp. Recommending use of w/c for safe mobility for longer distance especially after dialysis. Pt with support at home to assist with pushing and negotiating the stoop step. Acute PT to cont to follow.    Follow Up Recommendations  Home health PT;Supervision for mobility/OOB     Equipment Recommendations  Wheelchair (measurements PT);Wheelchair cushion (measurements PT)    Recommendations for Other Services       Precautions / Restrictions Precautions Precautions: Fall Precaution Comments: necrotic toes Restrictions Weight Bearing Restrictions: No    Mobility  Bed Mobility Overal bed mobility: Needs Assistance Bed Mobility: Supine to Sit     Supine to sit: Supervision     General bed mobility comments: increased time, HOB elevated, use of bed rail, guarded due to anticipatory of pain in feet once in dependent position at edge of bed    Transfers Overall transfer level: Needs assistance Equipment used: Rolling walker (2 wheeled) Transfers: Sit to/from Stand Sit to Stand: Min  guard         General transfer comment: min guard for safety, max verbal and tactile cues to push up from bed/chair and reach back for bed/chair, pt with no carryover  Ambulation/Gait Ambulation/Gait assistance: Min guard Gait Distance (Feet): 50 Feet (x1, 100x1) Assistive device: Rolling walker (2 wheeled) Gait Pattern/deviations: Step-to pattern;Step-through pattern Gait velocity: dec   General Gait Details: initially step to pattern, worked on transitioning to step through, pt guarded with short step length but did progress in that direction, due to onset of foot pain pt had to stop and sit int he middle   Marine scientist [place] (Stroke Patients Only)       Balance Overall balance assessment: Needs assistance Sitting-balance support: No upper extremity supported;Feet supported Sitting balance-Leahy Scale: Good     Standing balance support: No upper extremity supported Standing balance-Leahy Scale: Fair Standing balance comment: RW for ambulation, able to perform toielting ADLs and washing hands without support but reports feeling unsteady                            Cognition Arousal/Alertness: Awake/alert Behavior During Therapy: WFL for tasks assessed/performed Overall Cognitive Status: Within Functional Limits for tasks assessed  Exercises      General Comments General comments (skin integrity, edema, etc.): pt with necrotic toes on both feet, per RN pt with sores on her bottom as well, L foot swollen      Pertinent Vitals/Pain Pain Assessment: 0-10 Pain Score: 9  Pain Location: bilat feet, more the R heel Pain Descriptors / Indicators: Grimacing;Guarding Pain Intervention(s): Limited activity within patient's tolerance    Home Living                      Prior Function            PT Goals (current goals can now be found in the care  plan section) Acute Rehab PT Goals Patient Stated Goal: go home Progress towards PT goals: Progressing toward goals    Frequency    Min 3X/week      PT Plan Current plan remains appropriate    Co-evaluation              AM-PAC PT "6 Clicks" Mobility   Outcome Measure  Help needed turning from your back to your side while in a flat bed without using bedrails?: None Help needed moving from lying on your back to sitting on the side of a flat bed without using bedrails?: A Little Help needed moving to and from a bed to a chair (including a wheelchair)?: A Little Help needed standing up from a chair using your arms (e.g., wheelchair or bedside chair)?: A Little Help needed to walk in hospital room?: A Little Help needed climbing 3-5 steps with a railing? : A Little 6 Click Score: 19    End of Session Equipment Utilized During Treatment: Gait belt Activity Tolerance: Patient limited by fatigue Patient left: in bed;with call bell/phone within reach;with bed alarm set (sitting EOB to eat lunch) Nurse Communication: Mobility status PT Visit Diagnosis: Other abnormalities of gait and mobility (R26.89);Pain Pain - Right/Left: Left Pain - part of body: Leg;Ankle and joints of foot     Time: 1859-0931 PT Time Calculation (min) (ACUTE ONLY): 22 min  Charges:  $Gait Training: 8-22 mins                     Kittie Plater, PT, DPT Acute Rehabilitation Services Pager #: [phone] Office #: [phone]    Berline Lopes 10/03/2020, 2:04 PM

## 2020-10-03 NOTE — Progress Notes (Cosign Needed)
    Durable Medical Equipment  (From admission, onward)         Start     Ordered   10/03/20 1509  For home use only DME lightweight manual wheelchair with seat cushion  Once       Comments: Patient suffers from  symptomatic anemia/ weakness  which impairs their ability to perform daily activities like walking in the home.  A rolling walker will not resolve  issue with performing activities of daily living. A wheelchair will allow patient to safely perform daily activities. Patient is not able to propel themselves in the home using a standard weight wheelchair due to weakness Patient can self propel in the lightweight wheelchair. Length of need 12 months. Accessories: elevating leg rests (ELRs), wheel locks, extensions and anti-tippers.   10/03/20 1514

## 2020-10-03 NOTE — Plan of Care (Signed)
  Problem: Education: Goal: Knowledge of General Education information will improve Description Including pain rating scale, medication(s)/side effects and non-pharmacologic comfort measures Outcome: Adequate for Discharge   Problem: Education: Goal: Knowledge of General Education information will improve Description Including pain rating scale, medication(s)/side effects and non-pharmacologic comfort measures Outcome: Adequate for Discharge   Problem: Health Behavior/Discharge Planning: Goal: Ability to manage health-related needs will improve Outcome: Adequate for Discharge   Problem: Clinical Measurements: Goal: Ability to maintain clinical measurements within normal limits will improve Outcome: Adequate for Discharge Goal: Will remain free from infection Outcome: Adequate for Discharge Goal: Diagnostic test results will improve Outcome: Adequate for Discharge Goal: Respiratory complications will improve Outcome: Adequate for Discharge Goal: Cardiovascular complication will be avoided Outcome: Adequate for Discharge   Problem: Activity: Goal: Risk for activity intolerance will decrease Outcome: Adequate for Discharge   Problem: Nutrition: Goal: Adequate nutrition will be maintained Outcome: Adequate for Discharge   Problem: Coping: Goal: Level of anxiety will decrease Outcome: Adequate for Discharge   Problem: Elimination: Goal: Will not experience complications related to bowel motility Outcome: Adequate for Discharge Goal: Will not experience complications related to urinary retention Outcome: Adequate for Discharge   Problem: Pain Managment: Goal: General experience of comfort will improve Outcome: Adequate for Discharge   Problem: Safety: Goal: Ability to remain free from injury will improve Outcome: Adequate for Discharge   Problem: Skin Integrity: Goal: Risk for impaired skin integrity will decrease Outcome: Adequate for Discharge   

## 2020-10-03 NOTE — Anesthesia Procedure Notes (Signed)
Procedure Name: LMA Insertion Date/Time: 10/03/2020 9:38 AM Performed by: Rande Brunt, CRNA Pre-anesthesia Checklist: Patient identified, Emergency Drugs available, Suction available and Patient being monitored Patient Re-evaluated:Patient Re-evaluated prior to induction Oxygen Delivery Method: Circle System Utilized Preoxygenation: Pre-oxygenation with 100% oxygen Induction Type: IV induction Ventilation: Mask ventilation without difficulty LMA: LMA inserted LMA Size: 4.0 and 3.0 Number of attempts: 1 Placement Confirmation: positive ETCO2 Tube secured with: Tape Dental Injury: Teeth and Oropharynx as per pre-operative assessment

## 2020-10-03 NOTE — TOC Transition Note (Signed)
Transition of Care [facility]) - CM/SW Discharge Note   Patient Details  Name: Kathryn Hancock MRN: [id number] Date of Birth: 1950/10/14  Transition of Care [facility]) CM/SW Contact:  Sharin Mons, RN Phone Number: 10/03/2020, 3:24 PM   Clinical Narrative:    Patient will DC to: home Anticipated DC date: 10/03/2020 Family notified: yes Transport by: car  Admitted with anemia, hx of DM,hypertension,CAD s/p CABG1, PAF (Eliquis), AAA, ESRD TThSa and HLD.From home alone. Active with [facility] , PT/OT.  Per MD patient ready for DC today. RN, patient, patient's daughter, and [facility] made aware of DC. Pt states  daughter will assist with care if needed once d/c and provide the supervision needed.  Referral made with [employer] for W/C. DME: w/c will be delivered to bedside prior to d/c. Pt with out Rx med concerns. Post hospital f/u noted on AVS.  Daughter tio provide transportation to home.  RNCM will sign off for now as intervention is no longer needed. Please consult [place] again if new needs arise.   [name] (Daughter)      [phone]        Final next level of care: Home w Home Health Services Barriers to Discharge: No Barriers Identified   Patient Goals and CMS Choice Patient states their goals for this hospitalization and ongoing recovery are:: return to home   Choice offered to / list presented to : Patient  Discharge Placement                       Discharge Plan and Services   Discharge Planning Services: CM Consult Post Acute Care Choice: Home Health,Resumption of Svcs/PTA Provider          DME Arranged: Lightweight manual wheelchair with seat cushion   Date DME Agency Contacted: 10/03/20 Time DME Agency Contacted: [id number] Representative spoke with at DME Agency: [place]: PT,OT [place]: [place] Date [place]: 10/03/20 Time [place]: 6301 Representative spoke with at [place]:  [place] ([place]) Interventions     Readmission Risk Interventions No flowsheet data found.

## 2020-10-03 NOTE — Op Note (Signed)
    NAME: Kathryn Hancock    MRN: [id number] DOB: Sep 30, 1950    DATE OF OPERATION: 10/03/2020  PREOP DIAGNOSIS:    End-stage renal disease  POSTOP DIAGNOSIS:    Same  PROCEDURE:    Left second stage basilic vein transposition  SURGEON: Judeth Cornfield. Scot Dock, MD  ASSIST: None  ANESTHESIA: General  EBL: Minimal  INDICATIONS:    Kathryn Hancock is a 70 y.o. female who had undergone previous for stage basilic vein transposition of the left arm in January.  She presents for second stage.  FINDINGS:   2-4/2 mm basilic vein.  Good thrill at the completion of the procedure.  TECHNIQUE:   The patient was taken to the operating room and received a general anesthetic.  The left arm was prepped and draped in usual sterile fashion.  I looked at the location of the basilic vein with the SonoSite and I marked this.  A longitudinal incision was made just above the antecubital level over the basilic vein and the vein was dissected free.  Branches were divided between clips and 3-0 silk ties.  Dissection was carried down to the proximal aspect where the vein was sewn to the brachial artery.  Using 2 additional incisions along the medial aspect of the left arm the basilic vein was harvested up to the axilla.  Next a tunnel was created between the proximal distal incision and the patient was then heparinized.  There was an area that had was a narrow up near the axilla where a branch should come off and I therefore elected to divide the vein at this level in order to excise the narrowed segment.  The vein was clamped proximally and distally and then divided up at the axillary incision.  It was then brought to the previously created tunnel after it was marked prevent twisting.  The vein was then sewn back into end after the narrowed segment was excised with two 6-0 Prolene sutures.  At the completion was a good thrill in the fistula.  Hemostasis was obtained in the wounds.  Each of the wounds was closed with a  deep layer of 3-0 Vicryl and the skin closed with 4-0 Monocryl.  Dermabond was applied.  Patient tolerated the procedure well was transferred to recovery in stable condition.  All needle and sponge counts were correct.   Deitra Mayo, MD, FACS [facility]  DATE OF DICTATION:   10/03/2020

## 2020-10-03 NOTE — Transfer of Care (Signed)
Immediate Anesthesia Transfer of Care Note  Patient: Kathryn Hancock  Procedure(s) Performed: BASILIC VEIN TRANSPOSITION SECOND STAGE LEFT (Left )  Patient Location: PACU  Anesthesia Type:General  Level of Consciousness: awake, alert , oriented and drowsy  Airway & Oxygen Therapy: Patient Spontanous Breathing  Post-op Assessment: Report given to RN, Post -op Vital signs reviewed and stable and Patient moving all extremities  Post vital signs: Reviewed and stable  Last Vitals:  Vitals Value Taken Time  BP 140/58 10/03/20 1152  Temp    Pulse 70 10/03/20 1154  Resp 13 10/03/20 1154  SpO2 94 % 10/03/20 1154  Vitals shown include unvalidated device data.  Last Pain:  Vitals:   10/03/20 0820  TempSrc:   PainSc: 5       Patients Stated Pain Goal: 3 (01/41/03 0131)  Complications: No complications documented.

## 2020-10-03 NOTE — Discharge Instructions (Signed)
Dear Kathryn Hancock,  Thank you for letting us participate in your care. You were hospitalized for symptomatic anemia (low red blood cells) and treated with red blood cell infusion. We are very glad you are feeling better!  POST-HOSPITAL & CARE INSTRUCTIONS 1. You will need to follow up with your primary care doctor, vascular and vein specialist, kidney doctor, and heart doctor.  2. You may restart your Eliquis (blood thinner) TOMORROW, Tuesday 4/26.  3. For your tailbone and hip wounds, I have sent the collagenase ointment (brand name Santyl) to your pharmacy. Apply this ointment to wounds every day with clean Q tips, then cover with padded foam bandage. Change the foam bandage every 3 days or sooner if soiled.  4. Go to your follow up appointments (listed below)   DOCTOR'S APPOINTMENT   Future Appointments  Date Time Provider [place]  10/19/2020  9:20 AM Angelia Mould, MD [facility]  10/24/2020 10:40 AM Leonie Man, MD [facility]  11/16/2020  2:00 PM [facility] VASC 2 - [facility]  11/16/2020  3:00 PM [facility] PA [facility]    Follow-up Information    Sistasis, Rowena, MD. Schedule an appointment as soon as possible for a visit in 1 week(s).   Specialty: Family Medicine Why: Make a hospital follow up appointment with your primary care doctor. This should be about a week or so after discharge from the hospital.  Contact information: [address] [place] [address] [phone]        Leonie Man, MD .   Specialty: Cardiology Contact information: [address] [place] [address] [phone]        Ernest Haber, PA-C. Schedule an appointment as soon as possible for a visit in 2 week(s).   Specialty: Nephrology Why: Please make a hospital follow up appointment with your nephrology clinic (kidney doctor).  Contact information: [place] [address] [phone]        Angelia Mould, MD. Schedule an  appointment as soon as possible for a visit in 1 month(s).   Specialties: Vascular Surgery, Cardiology Why: Follow up with your vein and vasuclar specialist as they had previously instructed to take care of your toes.  Contact information: [address] [place] [address] [phone]               Take care and be well!  [facility]  [place],  [address] [phone]

## 2020-10-03 NOTE — Progress Notes (Signed)
Kathryn Hancock, Kathryn Hancock, Kathryn Hancock, Kathryn Hancock, Kathryn and nausea.   Objective Vitals:   10/03/20 1152 10/03/20 1207 10/03/20 1222 10/03/20 1249  BP: (!) 140/58 (!) 124/50 (!) 111/48 (!) 112/50  Pulse: 72 69 67 65  Resp: 17 12 14 16   Temp: 98.5 F (36.9 C)  97.6 F (36.4 C) 97.6 F (36.4 C)  TempSrc:    Oral  SpO2: 94% 92% 93%   Weight:      Height:       Physical Exam General:Hancock developed, alert female in NAD Heart:RRR no MRG Lungs:CTA nonlabored Abdomen:Bowel sounds normoactive Extremities: No edema b/l lower extremities Dialysis Access:Right IJ PermCath, left upper arm AV fistula maturing  Additional Objective Labs: Basic Metabolic Panel: Recent Labs  Lab 09/30/20 0317 10/01/20 0812 10/02/20 0252  NA 133* 133* 132*  K 4.4 4.8 4.0  CL 96* 100 96*  CO2 28 23 28   GLUCOSE 100* 118* 182*  BUN 24* 43* 21  CREATININE 2.49* 3.55* 2.18*  CALCIUM 7.8* 8.2* 7.9*  PHOS 3.1 4.4 3.2   Liver Function Tests: Recent Labs  Lab 09/30/20 0317 10/01/20 0812 10/01/20 0817 10/02/20 0252  AST 12*  --  12* 24  ALT 10  --  11 14  ALKPHOS 128*  --  134* 154*  BILITOT 0.5  --  0.8 0.6  PROT 4.7*  --  5.0* 5.2*  ALBUMIN 1.9*  1.9* 1.9* 1.9* 2.0*  2.0*   No results for input(s): LIPASE, AMYLASE in the last 168 hours. CBC: Recent Labs  Lab 09/29/20 1242 09/30/20 0317 09/30/20 1611 10/01/20 0817 10/02/20 0252  WBC 17.6* 12.5*  --  11.1* 10.8*  NEUTROABS 14.7*  --   --   --   --   HGB 8.2* 6.4* 7.9* 9.3* 9.6*  HCT 27.9* 20.2* 24.2* 28.3* 30.0*  MCV 112.5* 103.1*  --  95.3 97.7  PLT 296 230  --  187 202   Blood Culture    Component Value Date/Time   SDES URINE, RANDOM 09/29/2020 1745   SPECREQUEST  09/29/2020 1745    NONE Performed at [facility], [place] [address], [place], [place] [address]    CULT >=100,000 COLONIES/mL KLEBSIELLA  PNEUMONIAE (A) 09/29/2020 1745   REPTSTATUS 10/02/2020 FINAL 09/29/2020 1745    CBG: Recent Labs  Lab 10/02/20 1105 10/02/20 1640 10/02/20 1953 10/03/20 0658 10/03/20 0926  GLUCAP 139* 102* 145* 109* 102*   Medications:  . (feeding supplement) PROSource Plus  30 mL Oral BID BM  . amiodarone  200 mg Oral Daily  . atorvastatin  20 mg Oral Daily  . carvedilol  6.25 mg Oral BID WC  . Chlorhexidine Gluconate Cloth  6 each Topical Q0600  . collagenase  1 application Topical Daily  . diclofenac Sodium  2 g Topical TID AC & HS  . feeding supplement (GLUCERNA SHAKE)  237 mL Oral BID BM  . fosfomycin  3 g Oral Once  . insulin aspart  0-6 Units Subcutaneous TID WC  . lidocaine  1 patch Transdermal QHS  . multivitamin  1 tablet Oral QHS  . pantoprazole  40 mg Oral BID  . polyethylene glycol  17 g Oral Daily  . senna  1 tablet Oral Daily   [facility] 3 hrs 45 min, right IJ PermCath, left upper arm AV fistula maturing, DFR Manual 500  mL/min, EDW 59 (kg), Dialysate 3.0 K, 2.5 Ca, Received mircera 100 mcg on 09/20/20 No heparin, VDRA or binder  Assessment/Plan:  1. Symptomatic anemia: Pt presenting with weakness and dark stools, Hgb 6.8 on 4/14 at outpatient unit and 8.2 in the ED,dropped to 6's overnight requiring PRBC transfusion. 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 Hx of CAD, transfuse PRN for Hgb <8.Hgb now stable.  2. ESRD:Dialyzes on TTS schedule.Next HD tomorrow, can be done here or outpatient if patient is discharged. S/p basilic vein transposition today.  3. Hypertension/volume:BP soft, not reaching EDW outpatient due to hypotension. Edema improved, EDW likely needs to be raise a bit at discharge.  4. Metabolic bone disease:Calcium and phos controlled. Not on  VDRA or phosphorus binders, follow labs. 5. Nutrition:Renal diet/fluid restrictions recommended.  6. PAD: gangrene bilateral lower extremities, recent bypass by VVS.  7. Wounds: Pt reports painful wounds on hips/buttocks. Management per admitting team.     Anice Paganini, PA-C 10/03/2020, 1:42 PM  [facility] Pager: [phone]

## 2020-10-04 ENCOUNTER — Encounter (HOSPITAL_COMMUNITY): Payer: Self-pay | Admitting: Vascular Surgery

## 2020-10-04 LAB — SURGICAL PATHOLOGY

## 2020-10-04 NOTE — Discharge Summary (Addendum)
[facility] Discharge Summary  Patient name: Kathryn Hancock Medical record number: [id number] Date of birth: Jan 13, 1951 Age: 70 y.o. Gender: female Date of Admission: 09/29/2020  Date of Discharge: 10/03/2020 Admitting Physician: Frederik Pear, MD  Primary Care Provider: Gala Lewandowsky, MD Consultants: Nephrology  Indication for Hospitalization: Symptomatic anemia  Discharge Diagnoses/Problem List:  T2DM HTN CAD s/p CABG PAF on Eliquis AAA ESRD on HD T/Th/Sa HLD  Disposition: Home  Discharge Condition: Stable  Discharge Exam:  Blood pressure (!) 117/55, pulse 63, temperature 98.2 F (36.8 C), temperature source Oral, resp. rate 16, height 5\' 3"  (1.6 m), weight 60.9 kg, SpO2 96 %. Gen: awake, alert, NAD Card: RRR no murmur Resp: CTAB Ext: L arm surgical site from VVS hemostatic and without swelling, moderate ecchymoses     Brief Hospital Course:  Kathryn Hancock is a 70 y.o. female presenting with anemia . PMH is significant for T2DM, hypertension, CAD s/p CABG1, PAF (Eliquis), AAA, ESRD TThSa and HLD.   Anemia likely due to GI Bleed  Patient presenting with symptomatic anemia. Endorsing generalized weakness and "buckling legs" since AV fistula procedure on 4/4. Patient recommended to be evaluated in ED after having lab work completed at HD showing hgb of 6. Repeat Hgb in ED is 8.2. FOBT positive. Per chart review, had colonoscopy with polypectomy but unclear as to how long ago this procedure occurred. On exam, patient pale appearing with multiple areas of ecchymosis. Multiple hematomas along with vasculopath history likely cause of ongoing anemia that is now related to symptoms of weakness. Transfused 1 unit pRBC initially. 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 Recommended continued Protonix daily, avoid NSAIDs, and follow up  with GI outpatient in 3-4 weeks. Patient received total of 3 units of blood transfused over the course of admission. Hemoglobin on day of discharge 8.6.   ESRD on HD Cr 1.59 and GFR 35 on admission. HD sessions Tues, Thurs and Sat since Jan of 2022. Last session was earlier on 4/21, patient reports completing a full session. Per patient's daughter, they were told to hold eliquis starting 4/22 in preparation for another fistula procedure that appears to be scheduled for 4/25. Underwent VVS procedure 4/25, was successful, tolerated well.   Bilateral LE gangrene Noted to have bilateral LE gangrenous area along toes of both feet. Daughter states that this seems to be improving after re-vascularization procedure earlier during the month of April 2022. Sensation is intact in these areas although very tender to light touch on exam. Wound care consulted at admission. Will recommend she continue to follow with VVS outpatient.    Issues for Follow Up:  1. Hgb recheck in one week with PCP 2. Consider ambulatory wound care referral by PCP for pressure ulcers of sacrum and bilateral hips.  3. Follow with VVS for dry gangrene of toes 4. Monitor for recurrent symptoms of anemia 5. Follow up with Dr. Melina Copa, GI, in 3-4 weeks 6. Per GI, no ibuprofen, naproxen, or other non-steroidal anti-inflammatory drugs. 7. Continue Protonix 40 mg p.o. once a day  Significant Procedures: VVS basilic vein transposition 10/03/2020  Significant Labs and Imaging:  Recent Labs  Lab 10/01/20 0817 10/02/20 0252 10/03/20 1606  WBC 11.1* 10.8* 9.3  HGB 9.3* 9.6* 8.6*  HCT 28.3* 30.0* 26.8*  PLT 187 202 186   Recent Labs  Lab 09/29/20 1351 09/30/20 0317 10/01/20 9562 10/01/20 0817 10/02/20 0252 10/03/20 1606  NA 135 133* 133*  --  132* 130*  129*  K 4.3 4.4 4.8  --  4.0 4.5  4.5  CL 98 96* 100  --  96* 93*  97*  CO2 27 28 23   --  28 22  22   GLUCOSE 181* 100* 118*  --  182* 309*  316*  BUN 10 24* 43*  --  21  43*  43*  CREATININE 1.59* 2.49* 3.55*  --  2.18* 3.81*  3.68*  CALCIUM 8.1* 7.8* 8.2*  --  7.9* 7.9*  8.1*  PHOS  --  3.1 4.4  --  3.2 5.3*  ALKPHOS 169* 128*  --  134* 154* 120  AST 15 12*  --  12* 24 13*  ALT 13 10  --  11 14 12   ALBUMIN 2.4* 1.9*  1.9* 1.9* 1.9* 2.0*  2.0* 2.0*  1.9*    CHEST  1 VIEW 09/29/2020 COMPARISON:  07/01/2020 FINDINGS: Normal sized heart. Stable post CABG changes. Interval right jugular double-lumen catheter with the lumen tips in the superior vena cava. Clear lungs with normal vascularity. Unremarkable bones. IMPRESSION: No acute abnormality.  EGD 10/01/2020 - Erosive gastritis. 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 Treated with argon plasma coagulation (APC). - Non-bleeding incidental small duodenal diverticulum.. - Await pathology results.   Results/Tests Pending at Time of Discharge: EGD pathology results  Discharge Medications:  Allergies as of 10/03/2020      Reactions   Penicillins Anaphylaxis   Anaphylaxis as a child      Medication List    TAKE these medications   acetaminophen 500 MG tablet Commonly known as: TYLENOL Take 500-1,000 mg by mouth every 6 (six) hours as needed for moderate pain.   amiodarone 200 MG tablet Commonly known as: PACERONE Take 200 mg by mouth daily.   amLODipine 5 MG tablet Commonly known as: NORVASC Take 5 mg by mouth daily.   apixaban 5 MG Tabs tablet Commonly known as: Eliquis Take 1 tablet (5 mg total) by mouth 2 (two) times daily.   aspirin EC 81 MG tablet Take 81 mg by mouth daily. Swallow whole.   atorvastatin 20 MG tablet Commonly known as: LIPITOR Take 20 mg by mouth daily.   carvedilol 6.25 MG tablet Commonly known as: COREG TAKE 1 TABLET (6.25 MG TOTAL) BY MOUTH TWO TIMES DAILY WITH A MEAL. What changed: how much to take   collagenase ointment Commonly known as: SANTYL Apply 1 application topically every 3 (three) days. And cover with foam padded  bandage. Change every 3 days or sooner if soiled.   docusate sodium 100 MG capsule Commonly known as: COLACE Take 100 mg by mouth daily as needed for mild constipation.   famotidine 20 MG tablet Commonly known as: PEPCID Take 20 mg by mouth daily as needed for headache.   fluticasone 50 MCG/ACT nasal spray Commonly known as: FLONASE Place 2 sprays into both nostrils as needed for allergies or rhinitis.   folic acid 270 MCG tablet Commonly known as: FOLVITE Take 800 mcg by mouth daily.   glimepiride 1 MG tablet Commonly known as: AMARYL Take 1 mg by mouth 2 (two) times daily. Hold if blood sugar is less than 120   glucose blood test strip 1 each by Other route as needed. Use as instructed   lidocaine-prilocaine cream Commonly known as: EMLA Apply 1 application topically daily as needed (dialysis).   loperamide 2 MG tablet Commonly known as: IMODIUM A-D Take 2  mg by mouth 4 (four) times daily as needed for diarrhea or loose stools.   MIRCERA IJ Mircera   montelukast 10 MG tablet Commonly known as: SINGULAIR Take 10 mg by mouth daily as needed (allergies).   oxyCODONE 5 MG immediate release tablet Commonly known as: Oxy IR/ROXICODONE Take 5-10 mg by mouth 2 (two) times daily as needed for moderate pain.   vitamin C 1000 MG tablet Take 1,000 mg by mouth every evening.   Vitamin D3 25 MCG tablet Commonly known as: Vitamin D Take 2,000 Units by mouth every evening.            Discharge Care Instructions  (From admission, onward)         Start     Ordered   10/03/20 0000  Discharge wound care:       Comments: Apply Santyl (collagenase ointment) to wounds on sacrum (tailbone) and both hips daily, then cover with padded foam dressing. Change foam dressings every 3 days or when soiled.   10/03/20 1517          Discharge Instructions: Please refer to Patient Instructions section of EMR for full details.  Patient was counseled important signs and symptoms  that should prompt return to medical care, changes in medications, dietary instructions, activity restrictions, and follow up appointments.   Follow-Up Appointments:  Follow-up Information    Sistasis, Rowena, MD. Schedule an appointment as soon as possible for a visit in 1 week(s).   Specialty: Family Medicine Why: Make a hospital follow up appointment with your primary care doctor. This should be about a week or so after discharge from the hospital.  Contact information: [address] [place] [address] [phone]        Leonie Man, MD .   Specialty: Cardiology Contact information: [address] [place] [address] [phone]        Ernest Haber, PA-C. Schedule an appointment as soon as possible for a visit in 2 week(s).   Specialty: Nephrology Why: Please make a hospital follow up appointment with your nephrology clinic (kidney doctor).  Contact information: [place] [address] [phone]        Angelia Mould, MD. Schedule an appointment as soon as possible for a visit in 1 month(s).   Specialties: Vascular Surgery, Cardiology Why: Follow up with your vein and vasuclar specialist as they had previously instructed to take care of your toes.  Contact information: [place] [address] [phone]        Care, [facility] Follow up.   Specialty: [place] Why: Resumption of home of home health services are in place, start of care within 48 hours post discharge Contact information: [place] [address] [place] [address] [phone]               Ezequiel Essex, MD 10/04/2020, 3:51 PM PGY-1, [place]

## 2020-10-04 NOTE — Anesthesia Postprocedure Evaluation (Signed)
Anesthesia Post Note  Patient: Kathryn Hancock  Procedure(s) Performed: BASILIC VEIN TRANSPOSITION SECOND STAGE LEFT (Left )     Patient location during evaluation: PACU Anesthesia Type: General Level of consciousness: awake and alert Pain management: pain level controlled Vital Signs Assessment: post-procedure vital signs reviewed and stable Respiratory status: spontaneous breathing, nonlabored ventilation and respiratory function stable Cardiovascular status: blood pressure returned to baseline and stable Postop Assessment: no apparent nausea or vomiting Anesthetic complications: no   No complications documented.  Last Vitals:  Vitals:   10/03/20 1249 10/03/20 1500  BP: (!) 112/50 (!) 117/55  Pulse: 65 63  Resp: 16   Temp: 36.4 C 36.8 C  SpO2:  96%    Last Pain:  Vitals:   10/03/20 1500  TempSrc: Oral  PainSc:                  [name]

## 2020-10-18 ENCOUNTER — Other Ambulatory Visit: Payer: Self-pay

## 2020-10-18 DIAGNOSIS — N186 End stage renal disease: Secondary | ICD-10-CM

## 2020-10-18 DIAGNOSIS — Z992 Dependence on renal dialysis: Secondary | ICD-10-CM

## 2020-10-19 ENCOUNTER — Other Ambulatory Visit: Payer: Self-pay

## 2020-10-19 ENCOUNTER — Encounter: Payer: Self-pay | Admitting: Vascular Surgery

## 2020-10-19 ENCOUNTER — Ambulatory Visit (INDEPENDENT_AMBULATORY_CARE_PROVIDER_SITE_OTHER): Payer: Medicare Other | Admitting: Vascular Surgery

## 2020-10-19 VITALS — BP 117/71 | HR 75 | Temp 97.3°F | Resp 20 | Ht 63.0 in | Wt 134.0 lb

## 2020-10-19 DIAGNOSIS — N186 End stage renal disease: Secondary | ICD-10-CM

## 2020-10-19 DIAGNOSIS — Z992 Dependence on renal dialysis: Secondary | ICD-10-CM

## 2020-10-19 DIAGNOSIS — I70261 Atherosclerosis of native arteries of extremities with gangrene, right leg: Secondary | ICD-10-CM

## 2020-10-19 DIAGNOSIS — I70262 Atherosclerosis of native arteries of extremities with gangrene, left leg: Secondary | ICD-10-CM

## 2020-10-19 NOTE — H&P (View-Only) (Signed)
REASON FOR VISIT:   To discuss right femoropopliteal bypass grafting.  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  This point I think it would be best to proceed with bypass on the right side.  If she has adequate vein we would potentially do this with vein otherwise this would be a prosthetic bypass.  Looks like we could potentially go to the above-knee popliteal artery on the right also.  I have discussed the indications for the procedure and the potential complications and she is agreeable to proceed.  She is scheduled to see her cardiologist on Monday.  She dialyzes on Tuesdays Thursdays and Saturdays.  This reason I have scheduled her surgery for 10/31/2020.  We will have to hold her Eliquis for 48 hours prior to the procedure.  She will ultimately require bilateral transmetatarsal amputations most likely although for now The toes autoamputated.   HPI:   Kathryn Hancock is a pleasant 70 y.o. 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  She developed gangrene of the toes of both feet.  She underwent an arteriogram which showed bilateral superficial femoral artery occlusions.  It was felt that her best chance for limb salvage would be staged femoropopliteal bypass grafts.  The toes on the left were worse.  She had two-vessel runoff on the left via the peroneal and posterior tibial arteries.  She underwent a left femoral to above-knee popliteal artery bypass with PTFE on 09/12/2020.  Since I saw her last she does have some swelling in the left leg where she had her bypass.  The toes on the left foot are stable.  She has dry gangrene of the toes of the right foot and also a heel decubitus which hurts.  Very activity is very limited and I do not get any history of claudication.  She does have rest pain on the right.  She is  on Eliquis for A. fib.  Past Medical History:  Diagnosis Date  . AAA (abdominal aortic aneurysm) ([place]) 06/2019   Multiple small pseudoaneurysmal projections of the dominant aorta.  Distal abdominal aortic aneurysm 4.5 x 4.7 cm.  Greatest AP dimension of the infrarenal aorta is 4.9.  No evidence of thoracic aortic aneurysm or dissection.  Brief segment of proximal IMA occlusion.  . Anemia   . Coronary artery calcification seen on CAT scan 06/2019  . Coronary artery disease involving native coronary artery of native heart with unstable angina pectoris ([place]) 06/01/2020   Cardiac cath at [facility] 06/01/2020-preop for atrial myxoma resection -> severe proximal RCA and PDA -> had single-vessel CABG with SVG-PDA along with myxoma resection.  . Depression   . DM (diabetes mellitus), type 2 with renal complications ([place]) 1/61/0960  . Dry gangrene ([place]) -> right foot-toes 05/27/2020  . Dyspnea   . ESRD (end stage renal disease) on dialysis ([place]) 06/2020   Progression of CKDIIIb to ESRD initially related to thromboembolic event from left atrial myxoma; complicated by perioperative hypotension-; now 1 on HD TU/TH/SAT  . GERD (gastroesophageal reflux disease)   . Heavy smoker (more than 20 cigarettes per day)    ~ 2 ppd; since age 53 (100 pk yr) = has cut down to one half PPD.>  . Hyperlipidemia    Mixed  . Hyperlipidemia associated with type 2 diabetes mellitus ([place]) 08/05/2020  . Hypertension   .  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 = Status post resection December 23,2021 (done at [facility] because of no bed availability at [facility]  . Microscopic hematuria   . PAF (paroxysmal atrial fibrillation) ([place]) 07/01/2020   Initially noted postoperatively-left atrial myxoma resection and CABG x1.  Now on amiodarone and apixaban..  . Peripheral vascular disease ([place])   . Plantar wart of right foot   . PONV (postoperative  nausea and vomiting)     Family History  Problem Relation Age of Onset  . Diabetes Mother   . Diabetes Father   . Heart attack Father 78  . CAD Father   . Hyperlipidemia Father   . Hypertension Father     SOCIAL HISTORY: Social History   Tobacco Use  . Smoking status: Current Every Day Smoker    Packs/day: 0.50    Years: 50.00    Pack years: 25.00    Types: Cigarettes  . Smokeless tobacco: Never Used  Substance Use Topics  . Alcohol use: Not Currently    Allergies  Allergen Reactions  . Penicillins Anaphylaxis    Anaphylaxis as a child    Current Outpatient Medications  Medication Sig Dispense Refill  . acetaminophen (TYLENOL) 500 MG tablet Take 500-1,000 mg by mouth every 6 (six) hours as needed for moderate pain.    Marland Kitchen amiodarone (PACERONE) 200 MG tablet Take 200 mg by mouth daily.    Marland Kitchen amLODipine (NORVASC) 5 MG tablet Take 5 mg by mouth daily.    Marland Kitchen apixaban (ELIQUIS) 5 MG TABS tablet Take 1 tablet (5 mg total) by mouth 2 (two) times daily. 60 tablet 6  . Ascorbic Acid (VITAMIN C) 1000 MG tablet Take 1,000 mg by mouth every evening.    Marland Kitchen aspirin EC 81 MG tablet Take 81 mg by mouth daily. Swallow whole.    Marland Kitchen atorvastatin (LIPITOR) 20 MG tablet Take 20 mg by mouth daily.    . carvedilol (COREG) 6.25 MG tablet TAKE 1 TABLET (6.25 MG TOTAL) BY MOUTH TWO TIMES DAILY WITH A MEAL. (Patient taking differently: Take 6.25 mg by mouth 2 (two) times daily with a meal.) 60 tablet 0  . collagenase (SANTYL) ointment Apply 1 application topically every 3 (three) days. And cover with foam padded bandage. Change every 3 days or sooner if soiled. 90 g 1  . docusate sodium (COLACE) 100 MG capsule Take 100 mg by mouth daily as needed for mild constipation.    . famotidine (PEPCID) 20 MG tablet Take 20 mg by mouth daily as needed for headache.    . fluticasone (FLONASE) 50 MCG/ACT nasal spray Place 2 sprays into both nostrils as needed for allergies or rhinitis.    . folic acid (FOLVITE)  324 MCG tablet Take 800 mcg by mouth daily.    Marland Kitchen glimepiride (AMARYL) 1 MG tablet Take 1 mg by mouth 2 (two) times daily. Hold if blood sugar is less than 120    . glucose blood test strip 1 each by Other route as needed. Use as instructed    . lidocaine-prilocaine (EMLA) cream Apply 1 application topically daily as needed (dialysis).    Marland Kitchen loperamide (IMODIUM A-D) 2 MG tablet Take 2 mg by mouth 4 (four) times daily as needed for diarrhea or loose stools.    . Methoxy PEG-Epoetin Beta (MIRCERA IJ) Mircera    . montelukast (SINGULAIR) 10 MG tablet Take 10 mg by mouth daily as needed (  allergies).    Marland Kitchen oxyCODONE (OXY IR/ROXICODONE) 5 MG immediate release tablet Take 5-10 mg by mouth 2 (two) times daily as needed for moderate pain.    . Vitamin D3 (VITAMIN D) 25 MCG tablet Take 2,000 Units by mouth every evening.     No current facility-administered medications for this visit.    REVIEW OF SYSTEMS:  [X]  denotes positive finding, [ ]  denotes negative finding Cardiac  Comments:  Chest pain or chest pressure:    Shortness of breath upon exertion: x   Short of breath when lying flat:    Irregular heart rhythm:        Vascular    Pain in calf, thigh, or hip brought on by ambulation:    Pain in feet at night that wakes you up from your sleep:     Blood clot in your veins:    Leg swelling:         Pulmonary    Oxygen at home:    Productive cough:     Wheezing:         Neurologic    Sudden weakness in arms or legs:     Sudden numbness in arms or legs:     Sudden onset of difficulty speaking or slurred speech:    Temporary loss of vision in one eye:     Problems with dizziness:         Gastrointestinal    Blood in stool:     Vomited blood:         Genitourinary    Burning when urinating:     Blood in urine:        Psychiatric    Major depression:         Hematologic    Bleeding problems:    Problems with blood clotting too easily:        Skin    Rashes or ulcers: x        Constitutional    Fever or chills:     PHYSICAL EXAM:   Vitals:   10/19/20 0913  BP: 117/71  Pulse: 75  Resp: 20  Temp: (!) 97.3 F (36.3 C)  SpO2: 96%  Weight: 134 lb (60.8 kg)  Height: 5\' 3"  (1.6 m)    GENERAL: The patient is a well-nourished female, in no acute distress. The vital signs are documented above. CARDIAC: There is a regular rate and rhythm.  VASCULAR: She has palpable femoral pulses. PULMONARY: There is good air exchange bilaterally without wheezing or rales. ABDOMEN: Soft and non-tender with normal pitched bowel sounds.  MUSCULOSKELETAL: There are no major deformities or cyanosis. NEUROLOGIC: No focal weakness or paresthesias are detected. SKIN: She has multiple toes with gangrene on both feet and also a right heel area that is gangrenous.  I was unable to take pictures as the Internet was having issues. PSYCHIATRIC: The patient has a normal affect.  DATA:    No new data  Deitra Mayo [facility] [phone]

## 2020-10-19 NOTE — Progress Notes (Signed)
REASON FOR VISIT:   To discuss right femoropopliteal bypass grafting.  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  This point I think it would be best to proceed with bypass on the right side.  If she has adequate vein we would potentially do this with vein otherwise this would be a prosthetic bypass.  Looks like we could potentially go to the above-knee popliteal artery on the right also.  I have discussed the indications for the procedure and the potential complications and she is agreeable to proceed.  She is scheduled to see her cardiologist on Monday.  She dialyzes on Tuesdays Thursdays and Saturdays.  This reason I have scheduled her surgery for 10/31/2020.  We will have to hold her Eliquis for 48 hours prior to the procedure.  She will ultimately require bilateral transmetatarsal amputations most likely although for now The toes autoamputated.   HPI:   Kathryn Hancock is a pleasant 70 y.o. 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  She developed gangrene of the toes of both feet.  She underwent an arteriogram which showed bilateral superficial femoral artery occlusions.  It was felt that her best chance for limb salvage would be staged femoropopliteal bypass grafts.  The toes on the left were worse.  She had two-vessel runoff on the left via the peroneal and posterior tibial arteries.  She underwent a left femoral to above-knee popliteal artery bypass with PTFE on 09/12/2020.  Since I saw her last she does have some swelling in the left leg where she had her bypass.  The toes on the left foot are stable.  She has dry gangrene of the toes of the right foot and also a heel decubitus which hurts.  Very activity is very limited and I do not get any history of claudication.  She does have rest pain on the right.  She is  on Eliquis for A. fib.  Past Medical History:  Diagnosis Date  . AAA (abdominal aortic aneurysm) ([place]) 06/2019   Multiple small pseudoaneurysmal projections of the dominant aorta.  Distal abdominal aortic aneurysm 4.5 x 4.7 cm.  Greatest AP dimension of the infrarenal aorta is 4.9.  No evidence of thoracic aortic aneurysm or dissection.  Brief segment of proximal IMA occlusion.  . Anemia   . Coronary artery calcification seen on CAT scan 06/2019  . Coronary artery disease involving native coronary artery of native heart with unstable angina pectoris ([place]) 06/01/2020   Cardiac cath at [facility] 06/01/2020-preop for atrial myxoma resection -> severe proximal RCA and PDA -> had single-vessel CABG with SVG-PDA along with myxoma resection.  . Depression   . DM (diabetes mellitus), type 2 with renal complications ([place]) 7/67/3419  . Dry gangrene ([place]) -> right foot-toes 05/27/2020  . Dyspnea   . ESRD (end stage renal disease) on dialysis ([place]) 06/2020   Progression of CKDIIIb to ESRD initially related to thromboembolic event from left atrial myxoma; complicated by perioperative hypotension-; now 1 on HD TU/TH/SAT  . GERD (gastroesophageal reflux disease)   . Heavy smoker (more than 20 cigarettes per day)    ~ 2 ppd; since age 69 (100 pk yr) = has cut down to one half PPD.>  . Hyperlipidemia    Mixed  . Hyperlipidemia associated with type 2 diabetes mellitus ([place]) 08/05/2020  . Hypertension   .  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 = Status post resection December 23,2021 (done at [facility] because of no bed availability at [facility]  . Microscopic hematuria   . PAF (paroxysmal atrial fibrillation) ([place]) 07/01/2020   Initially noted postoperatively-left atrial myxoma resection and CABG x1.  Now on amiodarone and apixaban..  . Peripheral vascular disease ([place])   . Plantar wart of right foot   . PONV (postoperative  nausea and vomiting)     Family History  Problem Relation Age of Onset  . Diabetes Mother   . Diabetes Father   . Heart attack Father 40  . CAD Father   . Hyperlipidemia Father   . Hypertension Father     SOCIAL HISTORY: Social History   Tobacco Use  . Smoking status: Current Every Day Smoker    Packs/day: 0.50    Years: 50.00    Pack years: 25.00    Types: Cigarettes  . Smokeless tobacco: Never Used  Substance Use Topics  . Alcohol use: Not Currently    Allergies  Allergen Reactions  . Penicillins Anaphylaxis    Anaphylaxis as a child    Current Outpatient Medications  Medication Sig Dispense Refill  . acetaminophen (TYLENOL) 500 MG tablet Take 500-1,000 mg by mouth every 6 (six) hours as needed for moderate pain.    Marland Kitchen amiodarone (PACERONE) 200 MG tablet Take 200 mg by mouth daily.    Marland Kitchen amLODipine (NORVASC) 5 MG tablet Take 5 mg by mouth daily.    Marland Kitchen apixaban (ELIQUIS) 5 MG TABS tablet Take 1 tablet (5 mg total) by mouth 2 (two) times daily. 60 tablet 6  . Ascorbic Acid (VITAMIN C) 1000 MG tablet Take 1,000 mg by mouth every evening.    Marland Kitchen aspirin EC 81 MG tablet Take 81 mg by mouth daily. Swallow whole.    Marland Kitchen atorvastatin (LIPITOR) 20 MG tablet Take 20 mg by mouth daily.    . carvedilol (COREG) 6.25 MG tablet TAKE 1 TABLET (6.25 MG TOTAL) BY MOUTH TWO TIMES DAILY WITH A MEAL. (Patient taking differently: Take 6.25 mg by mouth 2 (two) times daily with a meal.) 60 tablet 0  . collagenase (SANTYL) ointment Apply 1 application topically every 3 (three) days. And cover with foam padded bandage. Change every 3 days or sooner if soiled. 90 g 1  . docusate sodium (COLACE) 100 MG capsule Take 100 mg by mouth daily as needed for mild constipation.    . famotidine (PEPCID) 20 MG tablet Take 20 mg by mouth daily as needed for headache.    . fluticasone (FLONASE) 50 MCG/ACT nasal spray Place 2 sprays into both nostrils as needed for allergies or rhinitis.    . folic acid (FOLVITE)  604 MCG tablet Take 800 mcg by mouth daily.    Marland Kitchen glimepiride (AMARYL) 1 MG tablet Take 1 mg by mouth 2 (two) times daily. Hold if blood sugar is less than 120    . glucose blood test strip 1 each by Other route as needed. Use as instructed    . lidocaine-prilocaine (EMLA) cream Apply 1 application topically daily as needed (dialysis).    Marland Kitchen loperamide (IMODIUM A-D) 2 MG tablet Take 2 mg by mouth 4 (four) times daily as needed for diarrhea or loose stools.    . Methoxy PEG-Epoetin Beta (MIRCERA IJ) Mircera    . montelukast (SINGULAIR) 10 MG tablet Take 10 mg by mouth daily as needed (  allergies).    Marland Kitchen oxyCODONE (OXY IR/ROXICODONE) 5 MG immediate release tablet Take 5-10 mg by mouth 2 (two) times daily as needed for moderate pain.    . Vitamin D3 (VITAMIN D) 25 MCG tablet Take 2,000 Units by mouth every evening.     No current facility-administered medications for this visit.    REVIEW OF SYSTEMS:  [X]  denotes positive finding, [ ]  denotes negative finding Cardiac  Comments:  Chest pain or chest pressure:    Shortness of breath upon exertion: x   Short of breath when lying flat:    Irregular heart rhythm:        Vascular    Pain in calf, thigh, or hip brought on by ambulation:    Pain in feet at night that wakes you up from your sleep:     Blood clot in your veins:    Leg swelling:         Pulmonary    Oxygen at home:    Productive cough:     Wheezing:         Neurologic    Sudden weakness in arms or legs:     Sudden numbness in arms or legs:     Sudden onset of difficulty speaking or slurred speech:    Temporary loss of vision in one eye:     Problems with dizziness:         Gastrointestinal    Blood in stool:     Vomited blood:         Genitourinary    Burning when urinating:     Blood in urine:        Psychiatric    Major depression:         Hematologic    Bleeding problems:    Problems with blood clotting too easily:        Skin    Rashes or ulcers: x        Constitutional    Fever or chills:     PHYSICAL EXAM:   Vitals:   10/19/20 0913  BP: 117/71  Pulse: 75  Resp: 20  Temp: (!) 97.3 F (36.3 C)  SpO2: 96%  Weight: 134 lb (60.8 kg)  Height: 5\' 3"  (1.6 m)    GENERAL: The patient is a well-nourished female, in no acute distress. The vital signs are documented above. CARDIAC: There is a regular rate and rhythm.  VASCULAR: She has palpable femoral pulses. PULMONARY: There is good air exchange bilaterally without wheezing or rales. ABDOMEN: Soft and non-tender with normal pitched bowel sounds.  MUSCULOSKELETAL: There are no major deformities or cyanosis. NEUROLOGIC: No focal weakness or paresthesias are detected. SKIN: She has multiple toes with gangrene on both feet and also a right heel area that is gangrenous.  I was unable to take pictures as the Internet was having issues. PSYCHIATRIC: The patient has a normal affect.  DATA:    No new data  Deitra Mayo [facility] [phone]

## 2020-10-22 DIAGNOSIS — D509 Iron deficiency anemia, unspecified: Secondary | ICD-10-CM | POA: Insufficient documentation

## 2020-10-24 ENCOUNTER — Other Ambulatory Visit: Payer: Self-pay

## 2020-10-24 ENCOUNTER — Encounter: Payer: Self-pay | Admitting: Cardiology

## 2020-10-24 ENCOUNTER — Ambulatory Visit (INDEPENDENT_AMBULATORY_CARE_PROVIDER_SITE_OTHER): Payer: Medicare Other | Admitting: Cardiology

## 2020-10-24 VITALS — BP 127/69 | HR 77 | Ht 63.0 in | Wt 135.6 lb

## 2020-10-24 DIAGNOSIS — D151 Benign neoplasm of heart: Secondary | ICD-10-CM

## 2020-10-24 DIAGNOSIS — I10 Essential (primary) hypertension: Secondary | ICD-10-CM

## 2020-10-24 DIAGNOSIS — I480 Paroxysmal atrial fibrillation: Secondary | ICD-10-CM

## 2020-10-24 DIAGNOSIS — I2510 Atherosclerotic heart disease of native coronary artery without angina pectoris: Secondary | ICD-10-CM

## 2020-10-24 DIAGNOSIS — I714 Abdominal aortic aneurysm, without rupture, unspecified: Secondary | ICD-10-CM

## 2020-10-24 DIAGNOSIS — E785 Hyperlipidemia, unspecified: Secondary | ICD-10-CM

## 2020-10-24 DIAGNOSIS — I779 Disorder of arteries and arterioles, unspecified: Secondary | ICD-10-CM | POA: Diagnosis not present

## 2020-10-24 DIAGNOSIS — Z720 Tobacco use: Secondary | ICD-10-CM

## 2020-10-24 DIAGNOSIS — D638 Anemia in other chronic diseases classified elsewhere: Secondary | ICD-10-CM

## 2020-10-24 DIAGNOSIS — E1169 Type 2 diabetes mellitus with other specified complication: Secondary | ICD-10-CM

## 2020-10-24 NOTE — Progress Notes (Signed)
Primary Care Provider: Gala Lewandowsky, MD Cardiologist: Glenetta Hew, MD Vascular Surgeon: Dr. Scot Dock Cardiac Surgeon: [facility] Dr. Norm Parcel; [facility] Dr. Roxy Manns Gastroenterologist: Dr. Melina Copa HD: [place], [place] Electrophysiologist: None  Clinic Note: Chief Complaint  Patient presents with   Follow-up    2-27-month and hospital follow-up;    Coronary Artery Disease    Single-vessel CABG in setting of infection myxoma resection.  No angina.   PAD    postop left femoropopliteal bypass with pending right femoropopliteal bypass surgery.     ===================================  ASSESSMENT/PLAN   Problem List Items Addressed This Visit     Left atrial myxoma (Chronic)    Status postresection at [facility] in December 2021.  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  Follow-up echocardiogram shows no evidence of myxoma.   She remains on Eliquis postop because of thromboembolic episodes, and postop A. fib.       Coronary artery disease involving native coronary artery of native heart without angina pectoris - Primary (Chronic)    She really did not notice any anginal symptoms prior to her preop catheterization revealing severe RCA disease.  She is now status post CABG x1 in December 2023.  She is clearly a vasculopath, and surprisingly only had 1 vessel disease in conjunction with severe PAD and AAA.  Plan: Back to 6.25 mg twice daily carvedilol along with amlodipine 5 mg daily with better control blood pressure. On moderate dose of atorvastatin with well-controlled lipids.  Most recent LDL was 22. Not on ARB because of ESRD.  Using amlodipine instead because of vasodilatory/claudication effect. On aspirin plus Eliquis.  Not on Plavix       PAF (paroxysmal atrial fibrillation) (HCC) -   CHA2DS2-VASc Score 5 (Age, Female, HTN, DM, Vascular)- On Eliquis (Chronic)    No further episodes of A. fib since her surgery.  Maintaining sinus rhythm on amiodarone.  -- was supposed to be on 100 mg daily (but this was lost in translation during her hospitalizations).  For now, I would continue with amiodarone until her vascular procedures/surgeries are totally completed.  This would avoid recurrent perioperative A. fib complications. Would hope to wean off amiodarone once her surgical procedures are completed.  She is on carvedilol at stable dose, and on Eliquis.  Okay to hold Eliquis 2 days preop for most procedures and 3 days preop for high risk procedures including neuro/spinal procedures.        Anemia of chronic disease (Chronic)    Being followed closely by nephrology.  Also had GI follow-up.  Continue PPI.       PAOD (peripheral arterial occlusive disease) ([place]) (Chronic)    S/p L Fem-AKPop Bypass 09/12/2020 (Dr. Scot Dock) - has planned R sided surgery for 10/31/2020.  She is stable cardiac standpoint.  Following her bypass surgeries, I suspect that she would likely require some partial amputations per Dr. Scot Dock.  Continue aggressive cardiovascular risk ratification with statin, aspirin, beta-blocker and amlodipine.       Tobacco abuse (Chronic)    Gradually cutting down.  May be down to half pack or less per day.  She has been using it for an emotional crutch.  Smoking cessation instruction/counseling given:  counseled patient on the dangers of tobacco use, advised patient to stop smoking, and reviewed strategies to maximize success        AAA (  abdominal aortic aneurysm) without rupture (HCC) (Chronic)    Also followed by Dr. Scot Dock from vascular surgery.   He had mentioned potential EVAR - but needs to take care of PAD issues / amputations first.  Defer to Dr. Scot Dock.       Essential hypertension (Chronic)    Stable blood pressure today on amlodipine 5 mg  daily, carvedilol 6.25 mg twice daily.  I have increased her carvedilol to 12.5 mg twice daily preop, but now postop she is back on lower dose.  As long as her blood pressure is stable, we will continue with current regimen.       Hyperlipidemia associated with type 2 diabetes mellitus ([place]) (Chronic)    Lipids checked in April were outstanding.  LDL was 22 on moderate dose of at atorvastatin (20 mg). Last A1c was 11.1 (prior to that it was 5.6)..  Currently on Tradjenta.   Questionable benefit with SGLT2 inhibitor in setting of ESRD.  Also not sure much benefit she will get from GLP-1 agonist.  Had previously been on sulfonylurea, but this was not restarted.  I suspect with an A1c of 11.1, she probably needs to be on insulin.       Relevant Medications   linagliptin (TRADJENTA) 5 MG TABS tablet    ===================================  HPI:    Kathryn Hancock is a 70 y.o. female with a complex PMH below who presents today for 2-18-month follow-up.  Notable PMH AAA -> surreptitiously found on a CT of the abdomen to evaluate for kidney stones Referred to Dr. Scot Dock October 2020 -> CT angiogram ordered confirming several small pseudoaneurysms of the abdominal aorta along with a filling defect in the left atrium suspicious for myxoma.  Also noted was coronary calcification LEFT ATRIAL MYXOMA - s/p resection 06/02/2020 @ [place] with CABG x 1 Initially seen on CT angiogram of the abdomen pelvis =>  Echo done July 24, 2019  (suggestion was organized thrombus versus tumor) Initial cardiology consultation July 1 => cardiac MRI ordered confirming LEFT ATRIAL MYXOMA -> referred to CVTS (Dr. Roxy Manns) - visit delayed until Dec 6. Was started on Eliquis for prophylaxis. Planned TEE/R&LHC 12/17 --  Pre-op Labs  05/26/2020 showed Severe ARF, Cr 3.3 --> thought to be Thromboembolic --> went to [facility]: Metabolic Acidosis & HyperKalemia Cr 3.9, K 7.3 --> no [facility] bed available -- [facility] to [facility]) TEE done confirming LA mass --> transferred to [facility] 05/31/20 06/01/20 Cath -> severe Prox RCA & PDA disease 06/02/20: Emergent LA Myxoma Resection/CABG x 1  1 V CAD- (Severe pRCA & PDA stenosis noted on Pre-Op Cath) - CABG x 1 (SVG-rPDA) PAD - R toe Ulcer; Bilateral R>L LE claudication;  06/30/20: Dry gangrene of Bilateral Feet R>L Abd Aortogram w/ Bilateral SFA& ATA CTO (SFAs recon @ AK Pop A) - 2 V runoff.  ESRD (CKD3 was exacerbated by ARF/Hyperkalemia - likely embolic event) - now on HD in [place]. T/Th/Sat Anemia of ESRD L Brachiocephalic Fistula 2/42/68 - 10/03/20 Second Stage Basilic V transposition. PAF (post-Op) - on Eliquis & Amiodarone HTN/HLD DM-2 Long-term smoker  Kathryn Hancock was last seen on August 05, 2020 as a post hospital follow-up from prolonged hospitalizations  Recent Hospitalizations: 4/4-11/2019:  L Fem-AK Pop Bypass with PTFE. DOAC restarted. Well perfused LLE -> plan HHPT.   4/21-25/2022: Admitted with Symptomatic Anemia - Hgb 6 (8.2 in ER); generalized weakness and buckling legs since PAD procedure on April 4. -> FOBT + Total 3 Unit  PRBC. - d/c Hgb 8.6 EGD - erosive gastritis & 1 non-bleeding angiodysplastic lesion in the stomach and small nonbleeding duodenal diverticulum. --> PPI daily & avoid NSAIDs. 4/25: Stage 2 VVS procedure for L-sided fistula    Kathryn Hancock was just seen by Dr. Scot Dock on May 11 as follow-up from her left femoropopliteal bypass.  Plan was to proceed with right femoropopliteal bypass.  He also indicated that she would likely ultimately require bilateral transmetatarsal amputations after completion of revascularization., and that her toes of autoamputated.  -> She did note some swelling in the left leg where the bypass was but the left foot was stable.  Noted gangrenous right foot toes as well as heel decubitus this quite painful.  Resting right foot/leg pain.  Reviewed  CV studies:    The following studies were reviewed  today: (if available, images/films reviewed: From Epic Chart or Care Everywhere) TTE 08/31/2020: Normal LVEF of 55-60%.  No R WMA.  GR 1 DD.  Mildly reduced RV function with normal PAP.  Trivial AI.  Left atrial myxoma no longer present.  Mitral valve stable trivial MR. Comparison(s): Changes from prior study are noted. 07/24/19: LVEF 60-65%,  grade 1 DD, large left atrial mass concerning for myxoma - there is no  longer evidence for left atrial mass.  ABI/TBI 08/18/2019: R ABI 0.65, abi0.61. bilateral great toe pressures 0 mmHg. 09/13/2019: L Fem-AKPopA bypass PTFE  Interval History:   Kathryn Hancock returns here today for routine follow-up postop from her left leg femoropopliteal surgery.  She is very happy with the results, her left foot feels much better.  The toes seem to have fallen off, but the gangrene is getting better.  She is eagerly awaiting having the right leg done.  Her swelling is notably improved now from even when she saw Dr. Scot Dock.  She is still very limited as far as activity, but no longer has pain in the left foot.  She still has some on the right.  She also is here after admission for symptomatic anemia for which she received 3 units of transfusion.  She feels much better energy wise since that transfusion.  She felt extremely weak and fatigued prior to that.  Now that she has been "tanked-up" she feels notably improved.  She is still receiving erythropoietin and iron via dialysis.  She tolerated her procedures relatively well from a cardiac standpoint with no angina, CHF symptoms.  No recurrence of A. fib.  She remains on amiodarone and Eliquis which was held for procedures.  CV Review of Symptoms (Summary) Cardiovascular ROS: positive for - edema and she is not very active, therefore not really able to tell if she has any exertional chest pain or dyspnea.  Edema is notably improved. negative for - irregular heartbeat, orthopnea, palpitations, paroxysmal nocturnal dyspnea, rapid  heart rate, shortness of breath, or lightheadedness, dizziness, wooziness or syncope/near syncope, TIA/amaurosis fugax. She no longer has resting left-sided leg/foot pain, but still has right-sided pain from her decubitus wound and toes.  She does indicate that some of the toes of a lot of amputated.  She is still wheelchair-bound and not yet able to walk but is working with [place]   Review of Systems  Constitutional:  Positive for malaise/fatigue (Notably improved following transfusion.  She still somewhat weak, but getting better every day.).  HENT:  Negative for congestion and nosebleeds.   Respiratory:  Positive for cough (Chronic morning smoker's cough), shortness of breath (Pretty  much at baseline) and wheezing (Sometimes in the morning). Negative for sputum production.   Cardiovascular:  Positive for leg swelling (Mostly postop swelling has improved.). Negative for claudication (Not walking enough to note claudication.  She has resting right-sided foot pain.).  Gastrointestinal:  Negative for abdominal pain, blood in stool, constipation, heartburn and melena.  Genitourinary:  Negative for hematuria.  Neurological:  Positive for dizziness (Mostly because she does not stand up much, she gets dizzy when she for stands.). Negative for tingling, focal weakness and weakness.  Psychiatric/Behavioral:  Positive for memory loss. Negative for depression. The patient is not nervous/anxious and does not have insomnia.    I have reviewed and (if needed) personally updated the patient's problem list, medications, allergies, past medical and surgical history, social and family history.   PAST MEDICAL HISTORY    Past Medical History:  Diagnosis Date   AAA (abdominal aortic aneurysm) ([place]) 06/2019   Multiple small pseudoaneurysmal projections of the dominant aorta.  Distal abdominal aortic aneurysm 4.5 x 4.7 cm.  Greatest AP dimension of the infrarenal aorta is 4.9.  No  evidence of thoracic aortic aneurysm or dissection.  Brief segment of proximal IMA occlusion.   Anemia    Coronary artery disease involving native coronary artery of native heart without angina pectoris 06/01/2020   Cardiac [facility] at [facility] 06/01/2020-preop for atrial myxoma resection -> severe proximal RCA and PDA -> had single-vessel CABG with SVG-PDA along with myxoma resection.   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-toes 05/27/2020   ESRD (end stage renal disease) on dialysis ([place]) 06/2020   Progression of CKDIIIb to ESRD initially related to thromboembolic event from left atrial myxoma; complicated by perioperative hypotension-; now 1 on HD TU/TH/SAT @ [place]   GERD (gastroesophageal reflux disease)    Heavy smoker (more than 20 cigarettes per day)    ~ 2 ppd; since age 41 (48 pk yr) = has cut down to one half PPD.>   Hyperlipidemia associated with type 2 diabetes mellitus ([place]) 08/05/2020   Hypertension    LEFTATRIAL MYXOMA 06/2019   Large residual myxoma-complicated by thrombolic events with progression of renal failure and PAD. = Status post resection December 23,2021 (done at [facility] because of no bed availability at [facility]   Microscopic hematuria    PAF (paroxysmal atrial fibrillation) ([place]) 07/01/2020   Initially noted postoperatively-left atrial myxoma resection and CABG x1.  Now on amiodarone and apixaban..   Peripheral vascular disease ([place])    Bilateral SFA & ATA occlusion.  2 V runoffvia Peroneal A & PTA. --> s/p L Fem-Pop (AK pop A) bypass (PFTE) 09/12/2020 - pending R Fem-AKPop bypass   Plantar wart of right foot    PONV (postoperative nausea and vomiting)     PAST SURGICAL HISTORY   Past Surgical History:  Procedure Laterality Date   ABDOMINAL AORTOGRAM W/LOWER EXTREMITY N/A 07/08/2020   Procedure: ABDOMINAL AORTOGRAM W/LOWER EXTREMITY;  Surgeon:  Angelia Mould, MD;  Location: [place] CV LAB:  2 R Renal & 1 L Renal A. Known Para-Ren  Aneurysm. Bilat Com, Internal & External Iliacs - CFA& DFA patent.  Bilat SFA 100% @ origin - recon @ AK Pop A.; Bilateral 2 V runnoff - Bilateral Peroneal A & PTA patent w/ Bilat ATA CTO.   AV FISTULA PLACEMENT Left 07/11/2020   Procedure: LEFT FIRST STAGE BRACHIOBASILIC UPPER  EXTREMITY ARTERIOVENOUS (AV) FISTULA CREATION;  Surgeon: [name], MD;  Location: [place];  Service: Vascular;  Laterality: Left;   BASCILIC VEIN TRANSPOSITION Left 10/03/2020   Procedure: BASILIC VEIN TRANSPOSITION SECOND STAGE LEFT;  Surgeon: Angelia Mould, MD;  Location: [place];  Service: Vascular;  Laterality: Left;   BIOPSY  10/01/2020   Procedure: BIOPSY;  Surgeon: Jackquline Denmark, MD;  Location: [facility] ENDOSCOPY;  Service: Endoscopy;;   Cardiac MRI  01/22/2020   Normal LV size and function.  Moderate focal basal septal hypertrophy.  No S.A.M.  LVEF 61%.  RVEF 66%.  Large mobile mass in the left atrium attached to the interatrial septum-does not appear to thrombus characteristics consistent with left atrial myxoma.   Chest CTA  06/2019   Multiple small pseudoaneurysmal projections of the dominant aorta.  Distal abdominal aortic aneurysm 4.5 x 4.7 cm.  Greatest AP dimension of the infrarenal aorta is 4.9.  No evidence of thoracic aortic aneurysm or dissection.  Brief segment of proximal IMA occlusion.  Large geographic filling defect of the left atrium with associated dense radiopaque material.  Aortic atherosclerosis and emphysema   COLONOSCOPY W/ POLYPECTOMY     CORONARY ARTERY BYPASS GRAFT  06/02/2020   @ [facility] - Dr. Norm Parcel; SVG-rPDA along with Atrial Myxoma Resection.   ESOPHAGOGASTRODUODENOSCOPY (EGD) WITH PROPOFOL N/A 10/01/2020   Procedure: ESOPHAGOGASTRODUODENOSCOPY (EGD) WITH PROPOFOL;  Surgeon: Jackquline Denmark, MD;  Location: [facility] ENDOSCOPY;  Service: Endoscopy;  Laterality: N/A;   EYE SURGERY Bilateral     Cataract surgery   FEMORAL-POPLITEAL BYPASS GRAFT Left 09/12/2020   Procedure: LEFT FEMORAL-ABOVE KNEE POPLITEAL ARTERY BYPASS GRAFT;  Surgeon: Angelia Mould, MD;  Location: [place];  Service: Vascular;  Laterality: Left;   HOT HEMOSTASIS N/A 10/01/2020   Procedure: HOT HEMOSTASIS (ARGON PLASMA COAGULATION/BICAP);  Surgeon: Jackquline Denmark, MD;  Location[facility] ENDOSCOPY;  Service: Endoscopy;  Laterality: N/A;   INTRAOPERATIVE TRANSESOPHAGEAL ECHOCARDIOGRAM  06/02/2020   [facility] (LA Myxoma Resection & CABG x 1): Pre-op EF> 55%. Normal WM. Mild AI. Large LA mass with calcifications &minimal vascularity (5.92 x 4 cm @ largest. LA dilation. Diffuse atherosclerosis of descending Ao, focal calcification of Sinus of  Valsalva. -> Post-op. S/p Mass Excision - no residual mass in LA. No ASD or PFO.  EF >55% no WMA.   IR FLUORO GUIDE CV LINE RIGHT  07/06/2020   IR US GUIDE VASC ACCESS RIGHT  07/06/2020   LEFT ATRIAL MYXOMA RESECTION Left 06/02/2020   [facility]: Dr. Norm Parcel   LEFT HEART CATH AND CORONARY ANGIOGRAPHY  05/2020   [facility]: Biplane Coronary Angiography, Significant Prox RCA & rPDA disease. Normal CO/CI with elevated LVEDP.   TRANSESOPHAGEAL ECHOCARDIOGRAM  05/31/2020   [facility]:  Normal LV function.  No LAA thrombus.  Large mobile left atrial mass attached to the left atrial septum (4.8 x 3.1 cm with calcification)-most consistent with atrial myxoma.  EF estimated 55%.  Normal valves.   TRANSTHORACIC ECHOCARDIOGRAM  07/2019    EF 60 to 65%.  GR 1 DD.  Elevated LVEDP.  Large size ill-defined echodensity in the left atrium suspicious for thrombus versus tumor.  Moderately dilated LA.   TRANSTHORACIC ECHOCARDIOGRAM  08/31/2020   (Postop left atrial myxoma resection) normal LVEF of 55-60%.  No R WMA.  GR 1 DD.  Mildly reduced RV function with normal PAP.  Trivial AI.  Left atrial myxoma no longer present.  Mitral valve stable trivial MR.   TUBAL LIGATION      Immunization  History  Administered Date(s)  Administered   Hepb-cpg 08/18/2020, 09/22/2020, 10/20/2020   Influenza-Unspecified 07/19/2020   Moderna Sars-Covid-2 Vaccination 10/22/2019, 11/20/2019   Pneumococcal Conjugate-13 07/26/2020    MEDICATIONS/ALLERGIES   Current Meds  Medication Sig   amiodarone (PACERONE) 200 MG tablet Take 200 mg by mouth daily.   amLODipine (NORVASC) 5 MG tablet Take 5 mg by mouth daily.   apixaban (ELIQUIS) 5 MG TABS tablet Take 1 tablet (5 mg total) by mouth 2 (two) times daily.   Ascorbic Acid (VITAMIN C) 1000 MG tablet Take 1,000 mg by mouth every evening.   aspirin EC 81 MG tablet Take 81 mg by mouth daily. Swallow whole.   atorvastatin (LIPITOR) 20 MG tablet Take 20 mg by mouth daily.   carvedilol (COREG) 6.25 MG tablet TAKE 1 TABLET (6.25 MG TOTAL) BY MOUTH TWO TIMES DAILY WITH A MEAL. (Patient taking differently: Take 6.25 mg by mouth 2 (two) times daily with a meal.)   collagenase (SANTYL) ointment Apply 1 application topically every 3 (three) days. And cover with foam padded bandage. Change every 3 days or sooner if soiled.   docusate sodium (COLACE) 100 MG capsule Take 100 mg by mouth daily as needed for mild constipation.   famotidine (PEPCID) 20 MG tablet Take 20 mg by mouth daily.   fluticasone (FLONASE) 50 MCG/ACT nasal spray Place 2 sprays into both nostrils as needed for allergies or rhinitis.   folic acid (FOLVITE) 376 MCG tablet Take 800 mcg by mouth daily.   glucose blood test strip 1 each by Other route as needed. Use as instructed   lidocaine-prilocaine (EMLA) cream Apply 1 application topically daily as needed (dialysis).   linagliptin (TRADJENTA) 5 MG TABS tablet Take 5 mg by mouth daily.   loperamide (IMODIUM A-D) 2 MG tablet Take 2 mg by mouth 4 (four) times daily as needed for diarrhea or loose stools.   Methoxy PEG-Epoetin Beta (MIRCERA IJ) Mircera   montelukast (SINGULAIR) 10 MG tablet Take 10 mg by mouth daily as needed (allergies).   Vitamin D3 (VITAMIN D) 25 MCG tablet  Take 2,000 Units by mouth every evening.   [DISCONTINUED] acetaminophen (TYLENOL) 500 MG tablet Take 500-1,000 mg by mouth every 6 (six) hours as needed for moderate pain.   [DISCONTINUED] linaGLIPtin (TRADJENTA PO) Take by mouth.   [DISCONTINUED] oxyCODONE (OXY IR/ROXICODONE) 5 MG immediate release tablet Take 5 mg by mouth 2 (two) times daily as needed for moderate pain.    Allergies  Allergen Reactions   Penicillins Anaphylaxis    Anaphylaxis as a child    SOCIAL HISTORY/FAMILY HISTORY   Reviewed in Epic:  Pertinent findings:  Social History   Tobacco Use   Smoking status: Every Day    Packs/day: 0.50    Years: 50.00    Pack years: 25.00    Types: Cigarettes   Smokeless tobacco: Never  Vaping Use   Vaping Use: Never used  Substance Use Topics   Alcohol use: Not Currently   Drug use: Never   Social History   Social History Narrative   She is a mother of 2-but her son died in 10-08-2019 of a drug overdose.   Denies alcohol use.      Patient is widowed and lives with her daughter and several grandchildren locally in [place].     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     At baseline, she lives a sedentary lifestyle and does not  exercise or get out of the house much at all.  She is limited by bilateral calf claudication.     She also experiences some exertional shortness of breath which is chronic and stable.       She smokes between 1 and 2 packs of cigarettes daily and has been smoking since she was age 73.      OBJCTIVE -PE, EKG, labs   Wgt 135 lb, 9.6 oz; Hgt 5'3" -> BMI 24.02 Was 140 lb on 08/05/20.  Physical Exam: BP 127/69   Pulse 77   Ht 5\' 3"  (1.6 m)   Wt 135 lb 9.6 oz (61.5 kg)   SpO2 98%   BMI 24.02 kg/m  Physical Exam Vitals reviewed.  Constitutional:      Appearance: She is normal weight. She is ill-appearing (Chronically ill-appearing.) and toxic-appearing.     Comments: Chronically  ill-appearing, frail elderly woman who appears older than stated age.  She is still wheelchair-bound  HENT:     Head: Normocephalic and atraumatic.  Neck:     Vascular: No carotid bruit (Fistula bruit heard throughout.  With Doppler not there is any carotid bruit), hepatojugular reflux or JVD.  Cardiovascular:     Rate and Rhythm: Normal rate and regular rhythm. No extrasystoles are present.    Chest Wall: PMI is not displaced.     Pulses: Decreased pulses.          Femoral pulses are  on the right side with bruit and  on the left side with bruit.      Popliteal pulses are 1+ on the right side and 2+ on the left side.       Dorsalis pedis pulses are 0 on the right side and 1+ on the left side.       Posterior tibial pulses are 1+ on the right side and 2+ on the left side.     Heart sounds: S1 normal and S2 normal. Heart sounds are distant. No murmur heard.   No friction rub. No gallop.  Pulmonary:     Effort: Pulmonary effort is normal. No respiratory distress.     Breath sounds: Wheezing (Mild late expiratory) present. No rhonchi or rales.     Comments: Mild diffuse interstitial sounds/crackles but no rales or rhonchi.  Mostly clear with cough. Chest:     Chest wall: No tenderness.  Musculoskeletal:        General: No swelling.     Cervical back: Normal range of motion and neck supple.     Right lower leg: No edema.     Left lower leg: Edema (Trace to 1+) present.     Comments: I did not have her take her shoes off to examine her feet today.  Skin:    General: Skin is warm and dry.     Coloration: Skin is pale.  Neurological:     General: No focal deficit present.     Mental Status: She is alert and oriented to person, place, and time.     Gait: Gait (Wheelchair-bound) normal.  Psychiatric:        Mood and Affect: Mood normal.        Behavior: Behavior normal.        Thought Content: Thought content normal.        Judgment: Judgment normal.     Comments: Seems to be in very  good spirits today.     Adult ECG Report Not checked  Recent Labs:  09/13/2020: TC 70, TG 67, HDL 35, LDL 22. 09/29/2020: A1c 11.1. 10/03/2020: Hgb 8.6; Cr 3.81, K+ 4.5.  No results found for: TSH  ==================================================  COVID-19 Education: The signs and symptoms of COVID-19 were discussed with the patient and how to seek care for testing (follow up with PCP or arrange E-visit).    I spent a total of 23 min with the patient spent in direct patient consultation.  Additional time spent with chart review  / charting (studies, outside notes, etc): 25 min -> Multiple studies were reviewed including her recent echocardiogram and peripheral vascular study.  2 hospitalizations in follow-up visits also reviewed.  Past medical and surgical history updated. Total Time: 58 min   Current medicines are reviewed at length with the patient today.  (+/- concerns) N/A  This visit occurred during the SARS-CoV-2 public health emergency.  Safety protocols were in place, including screening questions prior to the visit, additional usage of staff PPE, and extensive cleaning of exam room while observing appropriate contact time as indicated for disinfecting solutions.  Notice: This dictation was prepared with Dragon dictation along with smaller phrase technology. Any transcriptional errors that result from this process are unintentional and may not be corrected upon review.  Patient Instructions / Medication Changes & Studies & Tests Ordered   Patient Instructions  Medication Instructions:  No changes   *If you need a refill on your cardiac medications before your next appointment, please call your pharmacy*   Lab Work: Not needed    Testing/Procedures: Not needed   Follow-Up: At [facility], you and your health needs are our priority.  As part of our continuing mission to provide you with exceptional heart care, we have created designated Provider Care Teams.   These Care Teams include your primary Cardiologist (physician) and Advanced Practice Providers (APPs -  Physician Assistants and Nurse Practitioners) who all work together to provide you with the care you need, when you need it.     Your next appointment:   7 month(s) Dec 2022  The format for your next appointment:   In Person  Provider:   Glenetta Hew, MD    Studies Ordered:   No orders of the defined types were placed in this encounter.    Glenetta Hew, M.D., M.S. Interventional Cardiologist   Pager # [phone] Phone # [phone] [address] [place], [place] [address]   Thank you for choosing Heartcare at [facility]!!

## 2020-10-24 NOTE — Patient Instructions (Signed)
Medication Instructions:  No changes   *If you need a refill on your cardiac medications before your next appointment, please call your pharmacy*   Lab Work: Not needed    Testing/Procedures: Not needed   Follow-Up: At [facility], you and your health needs are our priority.  As part of our continuing mission to provide you with exceptional heart care, we have created designated Provider Care Teams.  These Care Teams include your primary Cardiologist (physician) and Advanced Practice Providers (APPs -  Physician Assistants and Nurse Practitioners) who all work together to provide you with the care you need, when you need it.     Your next appointment:   7 month(s) Dec 2022  The format for your next appointment:   In Person  Provider:   Glenetta Hew, MD

## 2020-10-25 NOTE — Progress Notes (Signed)
Surgical Instructions    Your procedure is scheduled on Monday, Oct 31, 2020.  Report to [facility] Main Entrance "A" at 05:30 A.M., then check in with the Admitting office.  Call this number if you have problems the morning of surgery:  [phone]   If you have any questions prior to your surgery date call [phone]: Open Monday-Friday 8am-4pm   Remember:  Do not eat or drink after midnight the night before your surgery    Take these medicines the morning of surgery with A SIP OF WATER : Amiodarone (PACERONE)  AmLODipine (NORVASC) Atorvastatin (LIPITOR)  Aspirin Carvedilol (COREG)  Famotidine (PEPCID)  If needed: Acetaminophen (TYLENOL)  Fluticasone (FLONASE)  Montelukast (SINGULAIR)  OxyCODONE (OXY IR/ROXICODONE)   Per your Doctor, STOP your Apixaban (Eliquis) 48 hours prior to surgery  As of today, STOP taking any Diclofenac (Volaren) gel, Aleve, Naproxen, Ibuprofen, Motrin, Advil, Goody's, BC's, all herbal medications, fish oil, and all vitamins.   WHAT DO I DO ABOUT MY DIABETES MEDICATION?   Marland Kitchen Do not take oral diabetes medicines (pills) the morning of surgery. NO Linagliptin (Tradjenta) the day of surgery.   HOW TO MANAGE YOUR DIABETES BEFORE AND AFTER SURGERY  Why is it important to control my blood sugar before and after surgery? . Improving blood sugar levels before and after surgery helps healing and can limit problems. . A way of improving blood sugar control is eating a healthy diet by: o  Eating less sugar and carbohydrates o  Increasing activity/exercise o  Talking with your doctor about reaching your blood sugar goals . High blood sugars (greater than 180 mg/dL) can raise your risk of infections and slow your recovery, so you will need to focus on controlling your diabetes during the weeks before surgery. . Make sure that the doctor who takes care of your diabetes knows about your planned surgery including the date and location.  How do I manage my  blood sugar before surgery? . Check your blood sugar at least 4 times a day, starting 2 days before surgery, to make sure that the level is not too high or low. . Check your blood sugar the morning of your surgery when you wake up and every 2 hours until you get to the Short Stay unit. o If your blood sugar is less than 70 mg/dL, you will need to treat for low blood sugar: - Do not take insulin. - Treat a low blood sugar (less than 70 mg/dL) with  cup of clear juice (cranberry or apple), 4 glucose tablets, OR glucose gel. - Recheck blood sugar in 15 minutes after treatment (to make sure it is greater than 70 mg/dL). If your blood sugar is not greater than 70 mg/dL on recheck, call [phone] for further instructions. . Report your blood sugar to the short stay nurse when you get to Short Stay.  . If you are admitted to the hospital after surgery: o Your blood sugar will be checked by the staff and you will probably be given insulin after surgery (instead of oral diabetes medicines) to make sure you have good blood sugar levels. o The goal for blood sugar control after surgery is 80-180 mg/dL.                      Do not wear jewelry, make up, or nail polish            Do not wear lotions, powders, perfumes, or deodorant.  Do not shave 48 hours prior to surgery.             Do not bring valuables to the hospital.            [facility] is not responsible for any belongings or valuables.  Do NOT Smoke (Tobacco/Vaping) or drink Alcohol 24 hours prior to your procedure If you use a CPAP at night, you may bring all equipment for your overnight stay.   Contacts, glasses, dentures or bridgework may not be worn into surgery, please bring cases for these belongings   For patients admitted to the hospital, discharge time will be determined by your treatment team.   Patients discharged the day of surgery will not be allowed to drive home, and someone needs to stay with them for 24  hours.    Special instructions:   [place]- Preparing For Surgery  Before surgery, you can play an important role. Because skin is not sterile, your skin needs to be as free of germs as possible. You can reduce the number of germs on your skin by washing with CHG (chlorahexidine gluconate) Soap before surgery.  CHG is an antiseptic cleaner which kills germs and bonds with the skin to continue killing germs even after washing.    Oral Hygiene is also important to reduce your risk of infection.  Remember - BRUSH YOUR TEETH THE MORNING OF SURGERY WITH YOUR REGULAR TOOTHPASTE  Please do not use if you have an allergy to CHG or antibacterial soaps. If your skin becomes reddened/irritated stop using the CHG.  Do not shave (including legs and underarms) for at least 48 hours prior to first CHG shower. It is OK to shave your face.  Please follow these instructions carefully.   1. Shower the NIGHT BEFORE SURGERY and the MORNING OF SURGERY  2. If you chose to wash your hair, wash your hair first as usual with your normal shampoo.  3. After you shampoo, rinse your hair and body thoroughly to remove the shampoo.  4. Wash Face and genitals (private parts) with your normal soap.   5.  Shower the NIGHT BEFORE SURGERY and the MORNING OF SURGERY with CHG Soap.   6. Use CHG Soap as you would any other liquid soap. You can apply CHG directly to the skin and wash gently with a scrungie or a clean washcloth.   7. Apply the CHG Soap to your body ONLY FROM THE NECK DOWN.  Do not use on open wounds or open sores. Avoid contact with your eyes, ears, mouth and genitals (private parts). Wash Face and genitals (private parts)  with your normal soap.   8. Wash thoroughly, paying special attention to the area where your surgery will be performed.  9. Thoroughly rinse your body with warm water from the neck down.  10. DO NOT shower/wash with your normal soap after using and rinsing off the CHG Soap.  11. Pat  yourself dry with a CLEAN TOWEL.  12. Wear CLEAN PAJAMAS to bed the night before surgery  13. Place CLEAN SHEETS on your bed the night before your surgery  14. DO NOT SLEEP WITH PETS.   Day of Surgery: Take a shower with CHG soap. Wear Clean/Comfortable clothing the morning of surgery Do not apply any deodorants/lotions.   Remember to brush your teeth WITH YOUR REGULAR TOOTHPASTE.   Please read over the following fact sheets that you were given.

## 2020-10-26 ENCOUNTER — Encounter (HOSPITAL_COMMUNITY): Payer: Self-pay

## 2020-10-26 ENCOUNTER — Encounter (HOSPITAL_COMMUNITY)
Admission: RE | Admit: 2020-10-26 | Discharge: 2020-10-26 | Disposition: A | Discharge status: Home / Self Care | Payer: Medicare Other | Source: Ambulatory Visit | Attending: Vascular Surgery | Admitting: Vascular Surgery

## 2020-10-26 ENCOUNTER — Other Ambulatory Visit: Payer: Self-pay

## 2020-10-26 DIAGNOSIS — Z7901 Long term (current) use of anticoagulants: Secondary | ICD-10-CM | POA: Insufficient documentation

## 2020-10-26 DIAGNOSIS — N186 End stage renal disease: Secondary | ICD-10-CM | POA: Diagnosis not present

## 2020-10-26 DIAGNOSIS — Z01812 Encounter for preprocedural laboratory examination: Secondary | ICD-10-CM | POA: Diagnosis present

## 2020-10-26 DIAGNOSIS — E1122 Type 2 diabetes mellitus with diabetic chronic kidney disease: Secondary | ICD-10-CM | POA: Insufficient documentation

## 2020-10-26 DIAGNOSIS — I739 Peripheral vascular disease, unspecified: Secondary | ICD-10-CM | POA: Diagnosis not present

## 2020-10-26 DIAGNOSIS — Z992 Dependence on renal dialysis: Secondary | ICD-10-CM | POA: Diagnosis not present

## 2020-10-26 DIAGNOSIS — Z951 Presence of aortocoronary bypass graft: Secondary | ICD-10-CM | POA: Insufficient documentation

## 2020-10-26 LAB — CBC
HCT: 37.2 % (ref 36.0–46.0)
Hemoglobin: 11.5 g/dL — ABNORMAL LOW (ref 12.0–15.0)
MCH: 31.2 pg (ref 26.0–34.0)
MCHC: 30.9 g/dL (ref 30.0–36.0)
MCV: 100.8 fL — ABNORMAL HIGH (ref 80.0–100.0)
Platelets: 437 10*3/uL — ABNORMAL HIGH (ref 150–400)
RBC: 3.69 MIL/uL — ABNORMAL LOW (ref 3.87–5.11)
RDW: 18.5 % — ABNORMAL HIGH (ref 11.5–15.5)
WBC: 21.2 10*3/uL — ABNORMAL HIGH (ref 4.0–10.5)
nRBC: 0 % (ref 0.0–0.2)

## 2020-10-26 LAB — COMPREHENSIVE METABOLIC PANEL
ALT: 24 U/L (ref 0–44)
AST: 41 U/L (ref 15–41)
Albumin: 2.5 g/dL — ABNORMAL LOW (ref 3.5–5.0)
Alkaline Phosphatase: 211 U/L — ABNORMAL HIGH (ref 38–126)
Anion gap: 14 (ref 5–15)
BUN: 26 mg/dL — ABNORMAL HIGH (ref 8–23)
CO2: 22 mmol/L (ref 22–32)
Calcium: 8.8 mg/dL — ABNORMAL LOW (ref 8.9–10.3)
Chloride: 98 mmol/L (ref 98–111)
Creatinine, Ser: 3.53 mg/dL — ABNORMAL HIGH (ref 0.44–1.00)
GFR, Estimated: 14 mL/min — ABNORMAL LOW (ref >60)
Glucose, Bld: 94 mg/dL (ref 70–99)
Potassium: 4.3 mmol/L (ref 3.5–5.1)
Sodium: 134 mmol/L — ABNORMAL LOW (ref 135–145)
Total Bilirubin: 0.2 mg/dL — ABNORMAL LOW (ref 0.3–1.2)
Total Protein: 6.6 g/dL (ref 6.5–8.1)

## 2020-10-26 LAB — SURGICAL PCR SCREEN
MRSA, PCR: NEGATIVE
Staphylococcus aureus: NEGATIVE

## 2020-10-26 LAB — PROTIME-INR
INR: 1.9 — ABNORMAL HIGH (ref 0.8–1.2)
Prothrombin Time: 21.7 seconds — ABNORMAL HIGH (ref 11.4–15.2)

## 2020-10-26 LAB — APTT: aPTT: 39 seconds — ABNORMAL HIGH (ref 24–36)

## 2020-10-26 LAB — GLUCOSE, CAPILLARY: Glucose-Capillary: 109 mg/dL — ABNORMAL HIGH (ref 70–99)

## 2020-10-26 NOTE — Progress Notes (Signed)
PCP - Dr. Gala Lewandowsky Cardiologist - Dr. Ellyn Hack in [facility]  Chest x-ray - Not indicated EKG - 09/30/20 Stress Test - Years ago didn't need to f/u with cardiologist after that test ECHO - 08/31/20 Cardiac Cath - 06/01/20  Sleep Study - Denies  DM - type II Fasting Blood Sugar - averages 90-110 Checks Blood Sugar morning and sometimes at night CBG at PAT appt 109  Blood Thinner Instructions:Eliquis to stop 48 hours prior to surgery per doctor Aspirin Instructions: None given by doctor   COVID TEST- 10/28/20  Anesthesia review: Yes cardiac history  Patient denies shortness of breath, fever, cough and chest pain at PAT appointment   All instructions explained to the patient, with a verbal understanding of the material. Patient agrees to go over the instructions while at home for a better understanding. Patient also instructed to self quarantine after being tested for COVID-19. The opportunity to ask questions was provided.

## 2020-10-26 NOTE — Progress Notes (Signed)
Lab reported that urine sample amount not sufficient to run test.

## 2020-10-27 NOTE — Progress Notes (Signed)
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 During her prior hospital stay at [facility] patient had worsening renal function and lower extremity ischemic change and postoperative A. fib. AKI on CKDwhich did not improve with conservative measures so subsequently patient underwent TDC on 1/26 and dialysis was started. 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 Vascular surgery was consulted and patient underwent arteriogram on 07/08/2020 and was found to have atherosclerosis of bilateral lower extremities with bilateral occluded SFAs.She subsequently underwent left femoropopliteal bypass on 09/12/2020.  She also went second stage left basilic vein transposition 10/04/2018.  She was admitted 09/29/2020 through 10/03/2020 for symptomatic anemia likely due to GI bleed.  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  Recommended continue Protonix daily, avoid NSAIDs, follow-up outpatient with GI.  Patient received total of 3 units of blood over the course of admission.  Hemoglobin on day of discharge was 8.6.  Patient seen by her primary cardiologist Dr. Ellyn Hack on 08/05/2020.  Noted that she was actually doing fairly well from a cardiac standpoint.  No known recurrence of paroxysmal atrial fibrillation, however she was continued on amiodarone 100 mg daily given her significant comorbidities and risk factors for recurrence of A. fib.  She was also continued on Eliquis.  No anginal symptoms with rest or exertion however noted that she was not doing very much, working with home health PT but exercise limited by significant bilateral foot pain due to PAD and dry gangrene.  Discussed that she would probably eventually need bilateral femoral to popliteal bypass, however at that time recommended to  wait as long as possible to allow her to recover from multiple ongoing comorbidities.  It does also appear she was seen by Dr. Ellyn Hack 10/24/2020 but currently there is no note in epic for this encounter.  Per surgery posting, pt will continue ASA and stop Eliquis 48hrs prior.   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  DM2, last A1c 11.1 on 09/29/20.  Preop labs reviewed, creatinine 3.53 consistent with ESRD, hemoglobin significantly improved to 11.5.  INR elevated 1.9.  Review of history shows previous elevations at similar levels.  INR was 1.8 prior to undergoing left femoropopliteal bypass.  Dr. Scot Dock has reviewed these results.  EKG 09/29/2020: NSR.  Rate 80.  CHEST 1 VIEW  09/29/2020: COMPARISON:  07/01/2020  FINDINGS: Normal sized heart. Stable post CABG changes. Interval right jugular double-lumen catheter with the lumen tips in the superior vena cava. Clear lungs with normal vascularity. Unremarkable bones.  IMPRESSION: No acute abnormality.   TTE 08/31/2020: 1. Left ventricular ejection fraction, by estimation, is 55 to 60%. The  left ventricle has normal function. The left ventricle has no regional  wall motion abnormalities. Left ventricular diastolic parameters are  consistent with Grade I diastolic  dysfunction (impaired relaxation).  2. Right ventricular systolic function is mildly reduced. The right  ventricular size is normal. There is normal pulmonary artery systolic  pressure. The estimated right ventricular systolic pressure is 00.3 mmHg.  3. The mitral valve is grossly normal. Trivial mitral valve  regurgitation.  4. The aortic valve is tricuspid. Aortic valve regurgitation is trivial.  Aortic regurgitation PHT measures 836 msec.  5. The inferior vena cava is normal in size with greater than 50%  respiratory variability, suggesting right atrial pressure of  3 mmHg.   Other recent cardiac/vascular studies from [facility]:  TEE12/21/2021: Normal LV function. No LAA thrombus.Large mobile left atrial mass attached to the left atrial septum (4.8 x 3.1 cm with calcification)-most consistent with atrial myxoma. EF estimated 55%. Normal valves.  ABIs/ LEA Dopplers 05/31/2020 ->suggested moderate to severe PAD on the right leg and moderate PAD on the left. Recommended vascular consultation. ? Right leg: 0.65 Left leg: 0.69 Right TBI: 0.34 Left TBI: 0.47  [name]  Cardiac Cath 06/01/2020: Biplane Coronary Angiography - 25 mL =.Significant proximal RCA and PDA disease; Elevated PCWP. Normal CO/CI.   CT Sgx 212/23/2021:  ? Intra-Op TEE Pre-Bypass: Normal Biventricular function(LVEF > 55%). Large LA mass with calcifications and minimal vascularity throughout, measured at 5.92 x 4cm at largest. LA dilation. Trace MR, no mitral stenosis. No increase in pulmonary vein velocities. Diffuse  atherosclerosis in descending aorta, focal calcification on sinus of valsalva. Mild eccentric AI from LCC/NCC.   Post-Bypass: Upon initial weaning of CPB, laceration noted in RV requiring return to CPB and fixation. S/p weaning with normal biventricular function on no inotropic support. S/p mass excision with no residual mass noted in LA, no ASD or PFO noted.  Pulmonaryveins and mitral valve similar to pre-procedure.  [place]:  07/08/2020(DrScot Dock): AbAoGram &LEA Runoff: 2 R Renal A, 1 L Renal A. Known Para-renal Aneurysm.  ? Right: Common Iliac,external iliac and hypogastric arteries patent; CFA/Deep FApatent.RSFA-occluded at origin, reconstitutes atAK Popliteal A - 2 V runnoffviaRPeronealAandRPosterior Tibial A, R ATA CTO ? Left: Common Iliac,external iliac and hypogastric arteries patent; CFA/Deep FApatent.L SFA -occluded at origin, reconstitutes atAK Popliteal A - 2 V runnoffviaRPeroneal AandRPosterior Tibial A, L ATA CTO CLINICAL NOTE:This patient hasBilat SFA TO.Currently  her toes are stable. If she developed worsening wounds on her feet her best chance for limb salvage would be staged bilateral femoral to above-knee popliteal artery bypass graft. Currently given that she is just started dialysis with multiple medical comorbidities we will not plan revascularization unless the wounds progress.   Wynonia Musty [facility] Short Stay Center/Anesthesiology Phone [phone] 10/28/2020 3:30 PM

## 2020-10-28 ENCOUNTER — Other Ambulatory Visit (HOSPITAL_COMMUNITY)
Admission: RE | Admit: 2020-10-28 | Discharge: 2020-10-28 | Disposition: A | Discharge status: Home / Self Care | Payer: Medicare Other | Source: Ambulatory Visit | Attending: Vascular Surgery | Admitting: Vascular Surgery

## 2020-10-28 DIAGNOSIS — Z01812 Encounter for preprocedural laboratory examination: Secondary | ICD-10-CM | POA: Insufficient documentation

## 2020-10-28 DIAGNOSIS — Z20822 Contact with and (suspected) exposure to covid-19: Secondary | ICD-10-CM | POA: Insufficient documentation

## 2020-10-28 LAB — SARS CORONAVIRUS 2 (TAT 6-24 HRS): SARS Coronavirus 2: NEGATIVE

## 2020-10-28 NOTE — Anesthesia Preprocedure Evaluation (Addendum)
Anesthesia Evaluation  Patient identified by MRN, date of birth, ID band Patient awake    Reviewed: Allergy & Precautions, NPO status , Patient's Chart, lab work & pertinent test results  Airway Mallampati: II  TM Distance: >3 FB Neck ROM: Full    Dental no notable dental hx.    Pulmonary neg pulmonary ROS, Current Smoker,    Pulmonary exam normal breath sounds clear to auscultation       Cardiovascular hypertension, + CAD, + CABG and + Peripheral Vascular Disease  Normal cardiovascular exam+ dysrhythmias Atrial Fibrillation  Rhythm:Regular Rate:Normal     Neuro/Psych negative neurological ROS  negative psych ROS   GI/Hepatic negative GI ROS, Neg liver ROS,   Endo/Other  negative endocrine ROSdiabetes  Renal/GU DialysisRenal disease  negative genitourinary   Musculoskeletal negative musculoskeletal ROS (+)   Abdominal   Peds negative pediatric ROS (+)  Hematology negative hematology ROS (+)   Anesthesia Other Findings   Reproductive/Obstetrics negative OB ROS                            Anesthesia Physical Anesthesia Plan  ASA: IV  Anesthesia Plan: General   Post-op Pain Management:    Induction: Intravenous  PONV Risk Score and Plan: 2 and Ondansetron, Dexamethasone and Treatment may vary due to age or medical condition  Airway Management Planned: Oral ETT  Additional Equipment:   Intra-op Plan:   Post-operative Plan: Extubation in OR  Informed Consent: I have reviewed the patients History and Physical, chart, labs and discussed the procedure including the risks, benefits and alternatives for the proposed anesthesia with the patient or authorized representative who has indicated his/her understanding and acceptance.     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 During her prior hospital stay at [facility] patient had worsening renal function and lower extremity ischemic change and postoperative A. fib. AKI on CKDwhich did not improve with conservative measures so subsequently patient underwent TDC on 1/26 and dialysis was started. 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 Vascular surgery was consulted and patient underwent arteriogram on 07/08/2020 and was found to have atherosclerosis of bilateral lower extremities with bilateral occluded SFAs.She subsequently underwent left femoropopliteal bypass on 09/12/2020.  She also went second stage left basilic vein transposition 10/04/2018.  She was admitted 09/29/2020 through 10/03/2020 for symptomatic anemia likely due to GI bleed.  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  Recommended continue Protonix daily, avoid NSAIDs, follow-up outpatient with GI.  Patient received total of 3 units of blood over the course of admission.  Hemoglobin on day of discharge was 8.6.  Patient seen by her primary cardiologist Dr. Ellyn Hack on 08/05/2020.  Noted that she was actually doing fairly well from a cardiac standpoint.  No known recurrence of paroxysmal atrial fibrillation, however she was continued on amiodarone 100 mg daily given her significant comorbidities and risk factors for recurrence of A. fib.  She was also continued on Eliquis.  No anginal symptoms with rest or exertion however noted that she was not doing very much, working with home health PT but exercise limited by significant bilateral foot pain due to PAD and  dry gangrene.  Discussed that she would probably eventually need bilateral femoral to popliteal bypass, however at that time recommended to wait as long as possible to allow her to recover from  multiple ongoing comorbidities.  It does also appear she was seen by Dr. Ellyn Hack 10/24/2020 but currently there is no note in epic for this encounter.  Per surgery posting, pt will continue ASA and stop Eliquis 48hrs prior.   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  DM2, last A1c 11.1 on 09/29/20.  Preop labs reviewed, creatinine 3.53 consistent with ESRD, hemoglobin significantly improved to 11.5.  INR elevated 1.9.  Review of history shows previous elevations at similar levels.  INR was 1.8 prior to undergoing left femoropopliteal bypass.  Dr. Scot Dock has reviewed these results.  EKG 09/29/2020: NSR.  Rate 80.  CHEST 1 VIEW  09/29/2020: COMPARISON:  07/01/2020  FINDINGS: Normal sized heart. Stable post CABG changes. Interval right jugular double-lumen catheter with the lumen tips in the superior vena cava. Clear lungs with normal vascularity. Unremarkable bones.  IMPRESSION: No acute abnormality.   TTE 08/31/2020: 1. Left ventricular ejection fraction, by estimation, is 55 to 60%. The  left ventricle has normal function. The left ventricle has no regional  wall motion abnormalities. Left ventricular diastolic parameters are  consistent with Grade I diastolic  dysfunction (impaired relaxation).  2. Right ventricular systolic function is mildly reduced. The right  ventricular size is normal. There is normal pulmonary artery systolic  pressure. The estimated right ventricular systolic pressure is 53.2 mmHg.  3. The mitral valve is grossly normal. Trivial mitral valve  regurgitation.  4. The aortic valve is tricuspid. Aortic valve regurgitation is trivial.  Aortic regurgitation PHT measures 836 msec.  5. The inferior vena cava is normal in size with greater than 50%  respiratory variability, suggesting right atrial pressure of 3 mmHg.   Other recent cardiac/vascular studies from [facility]  [facility]: TEE12/21/2021: Normal LV function. No LAA  thrombus.Large mobile left atrial mass attached to the left atrial septum (4.8 x 3.1 cm with calcification)-most consistent with atrial myxoma. EF estimated 55%. Normal valves. ABIs/ LEA Dopplers 05/31/2020 ->suggested moderate to severe PAD on the right leg and moderate PAD on the left. Recommended vascular consultation. Right leg: 0.65 Left leg: 0.69 Right TBI: 0.34 Left TBI: 0.47  [facility] Cardiac Cath 06/01/2020: Biplane Coronary Angiography - 25 mL =.Significant proximal RCA and PDA disease; Elevated PCWP. Normal CO/CI.  CT Sgx 212/23/2021:  Intra-Op TEE Pre-Bypass: Normal Biventricular function(LVEF > 55%). Large LA mass with calcifications and minimal vascularity throughout, measured at 5.92 x 4cm at largest. LA dilation. Trace MR, no mitral stenosis. No increase in pulmonary vein velocities. Diffuse  atherosclerosis in descending aorta, focal calcification on sinus of valsalva. Mild eccentric AI from LCC/NCC.   Post-Bypass: Upon initial weaning of CPB, laceration noted in RV requiring return to CPB and fixation. S/p weaning with normal biventricular function on no inotropic support. S/p mass excision with no residual mass noted in LA, no ASD or PFO noted.  Pulmonaryveins and mitral valve similar to pre-procedure.  [place]: 07/08/2020(DrScot Dock): AbAoGram &LEA Runoff: 2 R Renal A, 1 L Renal A. Known Para-renal Aneurysm.  Right: Common Iliac,external iliac and hypogastric arteries patent; CFA/Deep FApatent.RSFA-occluded at origin, reconstitutes atAK Popliteal A - 2 V runnoffviaRPeronealAandRPosterior Tibial A, R ATA CTO Left: Common Iliac,external iliac and hypogastric arteries patent; CFA/Deep FApatent.L SFA -occluded at origin, reconstitutes atAK Popliteal A - 2 V runnoffviaRPeroneal AandRPosterior Tibial  A, L ATA CTO CLINICAL NOTE:This patient hasBilat SFA TO.Currently her toes are stable. If she developed worsening wounds on her feet her best  chance for limb salvage would be staged bilateral femoral to above-knee popliteal artery bypass graft. Currently given that she is just started dialysis with multiple medical comorbidities we will not plan revascularization unless the wounds progress.  )       Anesthesia Quick Evaluation

## 2020-10-31 ENCOUNTER — Inpatient Hospital Stay (HOSPITAL_COMMUNITY): Payer: Medicare Other | Admitting: Vascular Surgery

## 2020-10-31 ENCOUNTER — Encounter (HOSPITAL_COMMUNITY)
Admission: RE | Disposition: A | Discharge status: Home Health | Payer: Self-pay | Source: Home / Self Care | Attending: Vascular Surgery

## 2020-10-31 ENCOUNTER — Encounter (HOSPITAL_COMMUNITY): Payer: Self-pay | Admitting: Vascular Surgery

## 2020-10-31 ENCOUNTER — Inpatient Hospital Stay (HOSPITAL_COMMUNITY): Payer: Medicare Other | Admitting: Anesthesiology

## 2020-10-31 ENCOUNTER — Inpatient Hospital Stay (HOSPITAL_COMMUNITY)
Admission: RE | Admit: 2020-10-31 | Discharge: 2020-11-02 | DRG: 252 | Disposition: A | Discharge status: Home Health | Payer: Medicare Other | Attending: Vascular Surgery | Admitting: Vascular Surgery

## 2020-10-31 DIAGNOSIS — E1122 Type 2 diabetes mellitus with diabetic chronic kidney disease: Secondary | ICD-10-CM | POA: Diagnosis present

## 2020-10-31 DIAGNOSIS — Z79899 Other long term (current) drug therapy: Secondary | ICD-10-CM

## 2020-10-31 DIAGNOSIS — I714 Abdominal aortic aneurysm, without rupture: Secondary | ICD-10-CM | POA: Diagnosis present

## 2020-10-31 DIAGNOSIS — E782 Mixed hyperlipidemia: Secondary | ICD-10-CM | POA: Diagnosis present

## 2020-10-31 DIAGNOSIS — I779 Disorder of arteries and arterioles, unspecified: Secondary | ICD-10-CM | POA: Diagnosis present

## 2020-10-31 DIAGNOSIS — Z880 Allergy status to penicillin: Secondary | ICD-10-CM | POA: Diagnosis not present

## 2020-10-31 DIAGNOSIS — K219 Gastro-esophageal reflux disease without esophagitis: Secondary | ICD-10-CM | POA: Diagnosis present

## 2020-10-31 DIAGNOSIS — I480 Paroxysmal atrial fibrillation: Secondary | ICD-10-CM | POA: Diagnosis present

## 2020-10-31 DIAGNOSIS — Z7901 Long term (current) use of anticoagulants: Secondary | ICD-10-CM | POA: Diagnosis not present

## 2020-10-31 DIAGNOSIS — E1152 Type 2 diabetes mellitus with diabetic peripheral angiopathy with gangrene: Secondary | ICD-10-CM | POA: Diagnosis present

## 2020-10-31 DIAGNOSIS — Z8249 Family history of ischemic heart disease and other diseases of the circulatory system: Secondary | ICD-10-CM | POA: Diagnosis not present

## 2020-10-31 DIAGNOSIS — I739 Peripheral vascular disease, unspecified: Secondary | ICD-10-CM

## 2020-10-31 DIAGNOSIS — N186 End stage renal disease: Secondary | ICD-10-CM | POA: Diagnosis present

## 2020-10-31 DIAGNOSIS — N2581 Secondary hyperparathyroidism of renal origin: Secondary | ICD-10-CM | POA: Diagnosis present

## 2020-10-31 DIAGNOSIS — I120 Hypertensive chronic kidney disease with stage 5 chronic kidney disease or end stage renal disease: Secondary | ICD-10-CM | POA: Diagnosis present

## 2020-10-31 DIAGNOSIS — Z992 Dependence on renal dialysis: Secondary | ICD-10-CM | POA: Diagnosis not present

## 2020-10-31 DIAGNOSIS — F17210 Nicotine dependence, cigarettes, uncomplicated: Secondary | ICD-10-CM | POA: Diagnosis present

## 2020-10-31 DIAGNOSIS — I2510 Atherosclerotic heart disease of native coronary artery without angina pectoris: Secondary | ICD-10-CM | POA: Diagnosis present

## 2020-10-31 DIAGNOSIS — Z7982 Long term (current) use of aspirin: Secondary | ICD-10-CM | POA: Diagnosis not present

## 2020-10-31 DIAGNOSIS — Z833 Family history of diabetes mellitus: Secondary | ICD-10-CM

## 2020-10-31 DIAGNOSIS — I96 Gangrene, not elsewhere classified: Secondary | ICD-10-CM

## 2020-10-31 DIAGNOSIS — Z20822 Contact with and (suspected) exposure to covid-19: Secondary | ICD-10-CM | POA: Diagnosis present

## 2020-10-31 DIAGNOSIS — L8990 Pressure ulcer of unspecified site, unspecified stage: Secondary | ICD-10-CM | POA: Insufficient documentation

## 2020-10-31 HISTORY — PX: FEMORAL-POPLITEAL BYPASS GRAFT: SHX937

## 2020-10-31 LAB — POCT I-STAT, CHEM 8
BUN: 21 mg/dL (ref 8–23)
Calcium, Ion: 1.11 mmol/L — ABNORMAL LOW (ref 1.15–1.40)
Chloride: 97 mmol/L — ABNORMAL LOW (ref 98–111)
Creatinine, Ser: 4 mg/dL — ABNORMAL HIGH (ref 0.44–1.00)
Glucose, Bld: 81 mg/dL (ref 70–99)
HCT: 33 % — ABNORMAL LOW (ref 36.0–46.0)
Hemoglobin: 11.2 g/dL — ABNORMAL LOW (ref 12.0–15.0)
Potassium: 3.8 mmol/L (ref 3.5–5.1)
Sodium: 133 mmol/L — ABNORMAL LOW (ref 135–145)
TCO2: 22 mmol/L (ref 22–32)

## 2020-10-31 LAB — TYPE AND SCREEN
ABO/RH(D): O POS
Antibody Screen: NEGATIVE

## 2020-10-31 LAB — GLUCOSE, CAPILLARY
Glucose-Capillary: 88 mg/dL (ref 70–99)
Glucose-Capillary: 65 mg/dL — ABNORMAL LOW (ref 70–99)
Glucose-Capillary: 67 mg/dL — ABNORMAL LOW (ref 70–99)
Glucose-Capillary: 194 mg/dL — ABNORMAL HIGH (ref 70–99)
Glucose-Capillary: 223 mg/dL — ABNORMAL HIGH (ref 70–99)
Glucose-Capillary: 230 mg/dL — ABNORMAL HIGH (ref 70–99)

## 2020-10-31 SURGERY — BYPASS GRAFT FEMORAL-POPLITEAL ARTERY
Anesthesia: General | Site: Leg Upper | Laterality: Right

## 2020-10-31 MED ORDER — ACETAMINOPHEN 650 MG RE SUPP: 325.0000 mg | RECTAL | Status: DC | PRN

## 2020-10-31 MED ORDER — CHLORHEXIDINE GLUCONATE CLOTH 2 % EX PADS
6.0000 | MEDICATED_PAD | Freq: Every day | CUTANEOUS | Status: DC
  Administered 2020-11-01 – 2020-11-02 (×2): 6 via TOPICAL

## 2020-10-31 MED ORDER — CHLORHEXIDINE GLUCONATE CLOTH 2 % EX PADS: 6.0000 | MEDICATED_PAD | Freq: Once | CUTANEOUS | Status: DC

## 2020-10-31 MED ORDER — CARVEDILOL 6.25 MG PO TABS
6.2500 mg | ORAL_TABLET | Freq: Two times a day (BID) | ORAL | Status: DC
  Administered 2020-10-31 – 2020-11-02 (×4): 6.25 mg via ORAL
  Filled 2020-10-31 (×5): qty 1

## 2020-10-31 MED ORDER — MIDAZOLAM HCL 2 MG/2ML IJ SOLN
INTRAMUSCULAR | Status: DC | PRN
  Administered 2020-10-31: 1 mg via INTRAVENOUS

## 2020-10-31 MED ORDER — GUAIFENESIN-DM 100-10 MG/5ML PO SYRP: 15.0000 mL | ORAL_SOLUTION | ORAL | Status: DC | PRN

## 2020-10-31 MED ORDER — FENTANYL CITRATE (PF) 250 MCG/5ML IJ SOLN
INTRAMUSCULAR | Status: DC | PRN
  Administered 2020-10-31 (×4): 50 ug via INTRAVENOUS

## 2020-10-31 MED ORDER — SODIUM CHLORIDE 0.9 % IV SOLN: INTRAVENOUS | Status: DC

## 2020-10-31 MED ORDER — HYDROMORPHONE HCL 1 MG/ML IJ SOLN: 0.5000 mg | INTRAMUSCULAR | Status: DC | PRN

## 2020-10-31 MED ORDER — LOPERAMIDE HCL 2 MG PO CAPS: 2.0000 mg | ORAL_CAPSULE | Freq: Four times a day (QID) | ORAL | Status: DC | PRN

## 2020-10-31 MED ORDER — CHLORHEXIDINE GLUCONATE 0.12 % MT SOLN
OROMUCOSAL | Status: AC
  Administered 2020-10-31: 15 mL via OROMUCOSAL
  Filled 2020-10-31: qty 15

## 2020-10-31 MED ORDER — PROTAMINE SULFATE 10 MG/ML IV SOLN
INTRAVENOUS | Status: DC | PRN
  Administered 2020-10-31 (×2): 10 mg via INTRAVENOUS
  Administered 2020-10-31: 20 mg via INTRAVENOUS

## 2020-10-31 MED ORDER — ASPIRIN EC 81 MG PO TBEC
81.0000 mg | DELAYED_RELEASE_TABLET | Freq: Every day | ORAL | Status: DC
  Administered 2020-11-01 – 2020-11-02 (×2): 81 mg via ORAL
  Filled 2020-10-31 (×2): qty 1

## 2020-10-31 MED ORDER — AMLODIPINE BESYLATE 5 MG PO TABS
5.0000 mg | ORAL_TABLET | Freq: Every day | ORAL | Status: DC
  Administered 2020-11-01 – 2020-11-02 (×2): 5 mg via ORAL
  Filled 2020-10-31 (×2): qty 1

## 2020-10-31 MED ORDER — ROCURONIUM BROMIDE 10 MG/ML (PF) SYRINGE
PREFILLED_SYRINGE | INTRAVENOUS | Status: DC | PRN
  Administered 2020-10-31: 60 mg via INTRAVENOUS

## 2020-10-31 MED ORDER — PAPAVERINE HCL 30 MG/ML IJ SOLN
INTRAMUSCULAR | Status: DC | PRN
  Administered 2020-10-31: 60 mg

## 2020-10-31 MED ORDER — SUGAMMADEX SODIUM 200 MG/2ML IV SOLN
INTRAVENOUS | Status: DC | PRN
  Administered 2020-10-31: 100 mg via INTRAVENOUS

## 2020-10-31 MED ORDER — HEPARIN SODIUM (PORCINE) 1000 UNIT/ML IJ SOLN
1500.0000 [IU] | Freq: Once | INTRAMUSCULAR | Status: AC
  Administered 2020-10-31: 1500 [IU]
  Filled 2020-10-31: qty 1.5

## 2020-10-31 MED ORDER — 0.9 % SODIUM CHLORIDE (POUR BTL) OPTIME
TOPICAL | Status: DC | PRN
  Administered 2020-10-31: 2000 mL

## 2020-10-31 MED ORDER — LIDOCAINE 2% (20 MG/ML) 5 ML SYRINGE
INTRAMUSCULAR | Status: AC
  Filled 2020-10-31: qty 5

## 2020-10-31 MED ORDER — DEXAMETHASONE SODIUM PHOSPHATE 10 MG/ML IJ SOLN
INTRAMUSCULAR | Status: AC
  Filled 2020-10-31: qty 1

## 2020-10-31 MED ORDER — ONDANSETRON HCL 4 MG/2ML IJ SOLN
INTRAMUSCULAR | Status: DC | PRN
  Administered 2020-10-31: 4 mg via INTRAVENOUS

## 2020-10-31 MED ORDER — ACETAMINOPHEN 325 MG PO TABS
325.0000 mg | ORAL_TABLET | ORAL | Status: DC | PRN
Start: 2020-10-31 — End: 2020-11-02
  Administered 2020-11-01: 650 mg via ORAL
  Filled 2020-10-31: qty 2

## 2020-10-31 MED ORDER — LACTATED RINGERS IV SOLN: INTRAVENOUS | Status: DC

## 2020-10-31 MED ORDER — GLYCOPYRROLATE PF 0.2 MG/ML IJ SOSY
PREFILLED_SYRINGE | INTRAMUSCULAR | Status: AC
  Filled 2020-10-31: qty 1

## 2020-10-31 MED ORDER — PAPAVERINE HCL 30 MG/ML IJ SOLN
INTRAMUSCULAR | Status: AC
  Filled 2020-10-31: qty 2

## 2020-10-31 MED ORDER — SODIUM CHLORIDE 0.9 % IV SOLN
INTRAVENOUS | Status: AC
  Filled 2020-10-31: qty 1.2

## 2020-10-31 MED ORDER — SODIUM CHLORIDE 0.9 % IV SOLN: 500.0000 mL | Freq: Once | INTRAVENOUS | Status: DC | PRN

## 2020-10-31 MED ORDER — OXYCODONE-ACETAMINOPHEN 5-325 MG PO TABS
1.0000 | ORAL_TABLET | ORAL | Status: DC | PRN
  Administered 2020-10-31 – 2020-11-01 (×4): 1 via ORAL
  Administered 2020-11-01: 2 via ORAL
  Administered 2020-11-02: 1 via ORAL
  Administered 2020-11-02: 2 via ORAL
  Filled 2020-10-31: qty 1
  Filled 2020-10-31: qty 2
  Filled 2020-10-31 (×3): qty 1
  Filled 2020-10-31: qty 2
  Filled 2020-10-31: qty 1

## 2020-10-31 MED ORDER — ONDANSETRON HCL 4 MG/2ML IJ SOLN
INTRAMUSCULAR | Status: AC
  Filled 2020-10-31: qty 2

## 2020-10-31 MED ORDER — HEPARIN SODIUM (PORCINE) 1000 UNIT/ML IJ SOLN
INTRAMUSCULAR | Status: AC
  Filled 2020-10-31: qty 1

## 2020-10-31 MED ORDER — DEXAMETHASONE SODIUM PHOSPHATE 10 MG/ML IJ SOLN
INTRAMUSCULAR | Status: DC | PRN
  Administered 2020-10-31: 5 mg via INTRAVENOUS

## 2020-10-31 MED ORDER — PANTOPRAZOLE SODIUM 40 MG PO TBEC
40.0000 mg | DELAYED_RELEASE_TABLET | Freq: Every day | ORAL | Status: DC
  Administered 2020-11-01 – 2020-11-02 (×2): 40 mg via ORAL
  Filled 2020-10-31 (×2): qty 1

## 2020-10-31 MED ORDER — LIDOCAINE 2% (20 MG/ML) 5 ML SYRINGE
INTRAMUSCULAR | Status: DC | PRN
  Administered 2020-10-31: 100 mg via INTRAVENOUS

## 2020-10-31 MED ORDER — EPHEDRINE SULFATE-NACL 50-0.9 MG/10ML-% IV SOSY
PREFILLED_SYRINGE | INTRAVENOUS | Status: DC | PRN
  Administered 2020-10-31: 7.5 mg via INTRAVENOUS

## 2020-10-31 MED ORDER — CHLORHEXIDINE GLUCONATE 0.12 % MT SOLN: 15.0000 mL | Freq: Once | OROMUCOSAL | Status: AC

## 2020-10-31 MED ORDER — HEPARIN SODIUM (PORCINE) 1000 UNIT/ML IJ SOLN
INTRAMUSCULAR | Status: DC | PRN
  Administered 2020-10-31: 6000 [IU] via INTRAVENOUS

## 2020-10-31 MED ORDER — LABETALOL HCL 5 MG/ML IV SOLN
10.0000 mg | INTRAVENOUS | Status: DC | PRN
Start: 2020-10-31 — End: 2020-11-02

## 2020-10-31 MED ORDER — PHENOL 1.4 % MT LIQD: 1.0000 | OROMUCOSAL | Status: DC | PRN

## 2020-10-31 MED ORDER — INSULIN ASPART 100 UNIT/ML IJ SOLN
0.0000 [IU] | Freq: Three times a day (TID) | INTRAMUSCULAR | Status: DC
  Administered 2020-10-31: 3 [IU] via SUBCUTANEOUS
  Administered 2020-11-01 – 2020-11-02 (×3): 2 [IU] via SUBCUTANEOUS
  Administered 2020-11-02: 1 [IU] via SUBCUTANEOUS

## 2020-10-31 MED ORDER — HYDROMORPHONE HCL 1 MG/ML IJ SOLN: 0.2500 mg | INTRAMUSCULAR | Status: DC | PRN

## 2020-10-31 MED ORDER — ORAL CARE MOUTH RINSE: 15.0000 mL | Freq: Once | OROMUCOSAL | Status: AC

## 2020-10-31 MED ORDER — PHENYLEPHRINE HCL-NACL 10-0.9 MG/250ML-% IV SOLN
INTRAVENOUS | Status: DC | PRN
  Administered 2020-10-31: 25 ug/min via INTRAVENOUS

## 2020-10-31 MED ORDER — FENTANYL CITRATE (PF) 250 MCG/5ML IJ SOLN
INTRAMUSCULAR | Status: AC
  Filled 2020-10-31: qty 5

## 2020-10-31 MED ORDER — PROTAMINE SULFATE 10 MG/ML IV SOLN
INTRAVENOUS | Status: AC
  Filled 2020-10-31: qty 5

## 2020-10-31 MED ORDER — VANCOMYCIN HCL IN DEXTROSE 1-5 GM/200ML-% IV SOLN
1000.0000 mg | INTRAVENOUS | Status: AC
  Administered 2020-10-31: 1000 mg via INTRAVENOUS

## 2020-10-31 MED ORDER — EPHEDRINE 5 MG/ML INJ
INTRAVENOUS | Status: AC
  Filled 2020-10-31: qty 10

## 2020-10-31 MED ORDER — DOCUSATE SODIUM 100 MG PO CAPS
100.0000 mg | ORAL_CAPSULE | Freq: Every day | ORAL | Status: DC
  Administered 2020-11-01 – 2020-11-02 (×2): 100 mg via ORAL
  Filled 2020-10-31 (×2): qty 1

## 2020-10-31 MED ORDER — AMIODARONE HCL 200 MG PO TABS
200.0000 mg | ORAL_TABLET | Freq: Every day | ORAL | Status: DC
  Administered 2020-11-01 – 2020-11-02 (×2): 200 mg via ORAL
  Filled 2020-10-31 (×2): qty 1

## 2020-10-31 MED ORDER — ONDANSETRON HCL 4 MG/2ML IJ SOLN: 4.0000 mg | Freq: Four times a day (QID) | INTRAMUSCULAR | Status: DC | PRN

## 2020-10-31 MED ORDER — VANCOMYCIN HCL IN DEXTROSE 1-5 GM/200ML-% IV SOLN
INTRAVENOUS | Status: AC
  Filled 2020-10-31: qty 200

## 2020-10-31 MED ORDER — ROCURONIUM BROMIDE 10 MG/ML (PF) SYRINGE
PREFILLED_SYRINGE | INTRAVENOUS | Status: AC
  Filled 2020-10-31: qty 10

## 2020-10-31 MED ORDER — PROPOFOL 10 MG/ML IV BOLUS
INTRAVENOUS | Status: AC
  Filled 2020-10-31: qty 20

## 2020-10-31 MED ORDER — LINAGLIPTIN 5 MG PO TABS
5.0000 mg | ORAL_TABLET | Freq: Every day | ORAL | Status: DC
  Administered 2020-11-01 – 2020-11-02 (×2): 5 mg via ORAL
  Filled 2020-10-31 (×2): qty 1

## 2020-10-31 MED ORDER — HYDRALAZINE HCL 20 MG/ML IJ SOLN: 5.0000 mg | INTRAMUSCULAR | Status: DC | PRN

## 2020-10-31 MED ORDER — SODIUM CHLORIDE 0.9 % IV SOLN
INTRAVENOUS | Status: DC | PRN
  Administered 2020-10-31: 500 mL

## 2020-10-31 MED ORDER — SENNOSIDES-DOCUSATE SODIUM 8.6-50 MG PO TABS: 1.0000 | ORAL_TABLET | Freq: Every evening | ORAL | Status: DC | PRN

## 2020-10-31 MED ORDER — GLYCOPYRROLATE PF 0.2 MG/ML IJ SOSY
PREFILLED_SYRINGE | INTRAMUSCULAR | Status: DC | PRN
  Administered 2020-10-31: .2 mg via INTRAVENOUS

## 2020-10-31 MED ORDER — MIDAZOLAM HCL 2 MG/2ML IJ SOLN
INTRAMUSCULAR | Status: AC
  Filled 2020-10-31: qty 2

## 2020-10-31 MED ORDER — METOPROLOL TARTRATE 5 MG/5ML IV SOLN: 2.0000 mg | INTRAVENOUS | Status: DC | PRN

## 2020-10-31 MED ORDER — ATORVASTATIN CALCIUM 10 MG PO TABS
20.0000 mg | ORAL_TABLET | Freq: Every day | ORAL | Status: DC
  Administered 2020-11-01 – 2020-11-02 (×2): 20 mg via ORAL
  Filled 2020-10-31 (×2): qty 2

## 2020-10-31 MED ORDER — PROPOFOL 10 MG/ML IV BOLUS
INTRAVENOUS | Status: DC | PRN
  Administered 2020-10-31: 70 mg via INTRAVENOUS

## 2020-10-31 MED ORDER — ONDANSETRON HCL 4 MG/2ML IJ SOLN: 4.0000 mg | Freq: Once | INTRAMUSCULAR | Status: DC | PRN

## 2020-10-31 MED ORDER — BISACODYL 10 MG RE SUPP: 10.0000 mg | Freq: Every day | RECTAL | Status: DC | PRN

## 2020-10-31 MED ORDER — FOLIC ACID 1 MG PO TABS
1.0000 mg | ORAL_TABLET | Freq: Every day | ORAL | Status: DC
  Administered 2020-11-01 – 2020-11-02 (×2): 1 mg via ORAL
  Filled 2020-10-31 (×2): qty 1

## 2020-10-31 MED ORDER — VITAMIN D3 25 MCG (1000 UNIT) PO TABS
2000.0000 [IU] | ORAL_TABLET | Freq: Every evening | ORAL | Status: DC
  Administered 2020-10-31 – 2020-11-02 (×3): 2000 [IU] via ORAL
  Filled 2020-10-31 (×6): qty 2

## 2020-10-31 SURGICAL SUPPLY — 54 items
BANDAGE ESMARK 6X9 LF (GAUZE/BANDAGES/DRESSINGS) ×1 IMPLANT
BNDG ESMARK 6X9 LF (GAUZE/BANDAGES/DRESSINGS) ×2
CANISTER SUCT 3000ML PPV (MISCELLANEOUS) ×2 IMPLANT
CANNULA VESSEL 3MM 2 BLNT TIP (CANNULA) ×4 IMPLANT
CLIP VESOCCLUDE MED 24/CT (CLIP) ×2 IMPLANT
CLIP VESOCCLUDE SM WIDE 24/CT (CLIP) ×2 IMPLANT
COVER PROBE W GEL 5X96 (DRAPES) IMPLANT
CUFF TOURN SGL QUICK 24 (TOURNIQUET CUFF) ×1
CUFF TOURN SGL QUICK 34 (TOURNIQUET CUFF)
CUFF TOURN SGL QUICK 42 (TOURNIQUET CUFF) IMPLANT
CUFF TRNQT CYL 24X4X16.5-23 (TOURNIQUET CUFF) ×1 IMPLANT
CUFF TRNQT CYL 34X4.125X (TOURNIQUET CUFF) IMPLANT
DERMABOND ADVANCED (GAUZE/BANDAGES/DRESSINGS) ×1
DERMABOND ADVANCED .7 DNX12 (GAUZE/BANDAGES/DRESSINGS) ×1 IMPLANT
DRAIN CHANNEL 15F RND FF W/TCR (WOUND CARE) IMPLANT
DRAPE HALF SHEET 40X57 (DRAPES) IMPLANT
DRAPE X-RAY CASS 24X20 (DRAPES) IMPLANT
ELECT REM PT RETURN 9FT ADLT (ELECTROSURGICAL) ×2
ELECTRODE REM PT RTRN 9FT ADLT (ELECTROSURGICAL) ×1 IMPLANT
EVACUATOR SILICONE 100CC (DRAIN) IMPLANT
GLOVE BIO SURGEON STRL SZ7.5 (GLOVE) ×2 IMPLANT
GLOVE SRG 8 PF TXTR STRL LF DI (GLOVE) ×1 IMPLANT
GLOVE SURG UNDER POLY LF SZ8 (GLOVE) ×1
GOWN STRL REUS W/ TWL LRG LVL3 (GOWN DISPOSABLE) ×3 IMPLANT
GOWN STRL REUS W/TWL LRG LVL3 (GOWN DISPOSABLE) ×3
KIT BASIN OR (CUSTOM PROCEDURE TRAY) ×2 IMPLANT
KIT TURNOVER KIT B (KITS) ×2 IMPLANT
LOOP VESSEL MAXI BLUE (MISCELLANEOUS) ×4 IMPLANT
LOOP VESSEL MINI RED (MISCELLANEOUS) ×2 IMPLANT
MARKER GRAFT CORONARY BYPASS (MISCELLANEOUS) ×2 IMPLANT
NS IRRIG 1000ML POUR BTL (IV SOLUTION) ×4 IMPLANT
PACK PERIPHERAL VASCULAR (CUSTOM PROCEDURE TRAY) ×2 IMPLANT
PAD ARMBOARD 7.5X6 YLW CONV (MISCELLANEOUS) ×4 IMPLANT
PADDING CAST ABS 4INX4YD NS (CAST SUPPLIES) ×1
PADDING CAST ABS COTTON 4X4 ST (CAST SUPPLIES) ×1 IMPLANT
SET COLLECT BLD 21X3/4 12 (NEEDLE) IMPLANT
SPONGE SURGIFOAM ABS GEL 100 (HEMOSTASIS) IMPLANT
STOPCOCK 4 WAY LG BORE MALE ST (IV SETS) IMPLANT
SUT ETHIBOND 5 LR DA (SUTURE) IMPLANT
SUT ETHILON 3 0 PS 1 (SUTURE) IMPLANT
SUT MNCRL AB 4-0 PS2 18 (SUTURE) ×8 IMPLANT
SUT PROLENE 5 0 C 1 24 (SUTURE) ×2 IMPLANT
SUT PROLENE 6 0 BV (SUTURE) ×4 IMPLANT
SUT SILK 2 0 SH (SUTURE) ×2 IMPLANT
SUT SILK 3 0 (SUTURE) ×1
SUT SILK 3-0 18XBRD TIE 12 (SUTURE) ×1 IMPLANT
SUT VIC AB 2-0 CTB1 (SUTURE) ×4 IMPLANT
SUT VIC AB 3-0 SH 27 (SUTURE) ×4
SUT VIC AB 3-0 SH 27X BRD (SUTURE) ×4 IMPLANT
TOWEL GREEN STERILE (TOWEL DISPOSABLE) ×2 IMPLANT
TRAY FOLEY MTR SLVR 16FR STAT (SET/KITS/TRAYS/PACK) ×2 IMPLANT
TUBING EXTENTION W/L.L. (IV SETS) IMPLANT
UNDERPAD 30X36 HEAVY ABSORB (UNDERPADS AND DIAPERS) ×2 IMPLANT
WATER STERILE IRR 1000ML POUR (IV SOLUTION) ×2 IMPLANT

## 2020-10-31 NOTE — Anesthesia Postprocedure Evaluation (Signed)
Anesthesia Post Note  Patient: Kathryn Hancock  Procedure(s) Performed: RIGHT BYPASS GRAFT FEMORAL- ABOVE KNEE POPLITEAL ARTERY (Right Leg Upper)     Patient location during evaluation: PACU Anesthesia Type: General Level of consciousness: awake and alert Pain management: pain level controlled Vital Signs Assessment: post-procedure vital signs reviewed and stable Respiratory status: spontaneous breathing, nonlabored ventilation, respiratory function stable and patient connected to nasal cannula oxygen Cardiovascular status: blood pressure returned to baseline and stable Postop Assessment: no apparent nausea or vomiting Anesthetic complications: no   No complications documented.  Last Vitals:  Vitals:   10/31/20 1125 10/31/20 1140  BP: (!) 100/38 (!) 100/43  Pulse: (!) 58 (!) 58  Resp: (!) 8 (!) 7  Temp:    SpO2: 99% 99%    Last Pain:  Vitals:   10/31/20 1140  TempSrc:   PainSc: Asleep                 Vernadine Coombs S

## 2020-10-31 NOTE — Progress Notes (Signed)
Arrived from OR to 4E.  CHG bath given.  CCMD notified.  Vital sign protocol initiated.  Noted R chest HD cath accessed with NS.

## 2020-10-31 NOTE — Anesthesia Procedure Notes (Signed)
Procedure Name: Intubation Date/Time: 10/31/2020 7:39 AM Performed by: Mariea Clonts, CRNA Pre-anesthesia Checklist: Patient identified, Emergency Drugs available, Suction available and Patient being monitored Patient Re-evaluated:Patient Re-evaluated prior to induction Oxygen Delivery Method: Circle System Utilized Preoxygenation: Pre-oxygenation with 100% oxygen Induction Type: IV induction Ventilation: Mask ventilation without difficulty Laryngoscope Size: Mac and 3 Grade View: Grade I Tube type: Oral Tube size: 7.0 mm Number of attempts: 1 Airway Equipment and Method: Stylet and Oral airway Placement Confirmation: ETT inserted through vocal cords under direct vision,  positive ETCO2 and breath sounds checked- equal and bilateral Tube secured with: Tape Dental Injury: Teeth and Oropharynx as per pre-operative assessment

## 2020-10-31 NOTE — Consult Note (Signed)
[facility] Renal Consultation Note    Indication for Consultation:  Management of ESRD/hemodialysis; anemia, hypertension/volume and secondary hyperparathyroidism  NUU:[id number], Sylvester Harder, MD  HPI: Kynzie Polgar is a 70 y.o. female with ESRD on HD TTS at [facility].  Past medical history significant for AAA, CAD, T2DM, HTN, Hx atrial myxoma and b/l PAD s/p L femoral popliteal artery bypass with PTFE by VVS on 09/12/2020.    Patient admitted today for planned R femoropopliteal bypass grafting due to critical limb ischemia by VVS. Procedure completed this AM.  Patient seen and examined at bedside in room.  Tolerated procedure well.  Denies CP, SOB, abdominal pain, n/v/d, weakness and dizziness.  Reports dialysis has been going well except for pain in her buttocks due to sitting for 4 hours with bedsores.  Last dialysis completed on 10/29/20.  Patient admitted for further evaluation and management.    Past Medical History:  Diagnosis Date  . AAA (abdominal aortic aneurysm) ([place]) 06/2019   Multiple small pseudoaneurysmal projections of the dominant aorta.  Distal abdominal aortic aneurysm 4.5 x 4.7 cm.  Greatest AP dimension of the infrarenal aorta is 4.9.  No evidence of thoracic aortic aneurysm or dissection.  Brief segment of proximal IMA occlusion.  . Anemia   . Coronary artery disease involving native coronary artery of native heart with unstable angina pectoris ([place]) 06/01/2020   Cardiac cath at [facility] 06/01/2020-preop for atrial myxoma resection -> severe proximal RCA and PDA -> had single-vessel CABG with SVG-PDA along with myxoma resection.  . Depression   . DM (diabetes mellitus), type 2 with renal complications ([place]) 0/34/7425  . 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 ESRD (end stage renal disease) on dialysis ([place]) 06/2020   Progression of CKDIIIb to ESRD initially related to thromboembolic event from left atrial myxoma; complicated by perioperative hypotension-;  now 1 on HD TU/TH/SAT  . GERD (gastroesophageal reflux disease)   . Heavy smoker (more than 20 cigarettes per day)    ~ 2 ppd; since age 77 (100 pk yr) = has cut down to one half PPD.>  . Hyperlipidemia associated with type 2 diabetes mellitus ([place]) 08/05/2020  . Hypertension   . 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 = Status post resection December 23,2021 (done at [name] because of no bed availability at [facility]  . Microscopic hematuria   . PAF (paroxysmal atrial fibrillation) ([place]) 07/01/2020   Initially noted postoperatively-left atrial myxoma resection and CABG x1.  Now on amiodarone and apixaban..  . Peripheral vascular disease ([place])   . Plantar wart of right foot   . PONV (postoperative nausea and vomiting)    Past Surgical History:  Procedure Laterality Date  . ABDOMINAL AORTOGRAM W/LOWER EXTREMITY N/A 07/08/2020   Procedure: ABDOMINAL AORTOGRAM W/LOWER EXTREMITY;  Surgeon: Angelia Mould, MD;  Location: [place] CV LAB;  Service: Cardiovascular;  Laterality: N/A;  . AV FISTULA PLACEMENT Left 07/11/2020   Procedure: LEFT FIRST STAGE BRACHIOBASILIC UPPER EXTREMITY ARTERIOVENOUS (AV) FISTULA CREATION;  Surgeon: [name], MD;  Location: [place];  Service: Vascular;  Laterality: Left;  . BASCILIC VEIN TRANSPOSITION Left 10/03/2020   Procedure: BASILIC VEIN TRANSPOSITION SECOND STAGE LEFT;  Surgeon: Angelia Mould, MD;  Location: [name];  Service: Vascular;  Laterality: Left;  . BIOPSY  10/01/2020   Procedure: BIOPSY;  Surgeon: Jackquline Denmark, MD;  Location: [facility] ENDOSCOPY;  Service: Endoscopy;;  . Cardiac  MRI  01/22/2020   Normal LV size and function.  Moderate focal basal septal hypertrophy.  No S.A.M.  LVEF 61%.  RVEF 66%.  Large mobile mass in the left atrium attached to the interatrial septum-does not appear to thrombus characteristics consistent with left atrial myxoma.  .  Chest CTA  06/2019   Multiple small pseudoaneurysmal projections of the dominant aorta.  Distal abdominal aortic aneurysm 4.5 x 4.7 cm.  Greatest AP dimension of the infrarenal aorta is 4.9.  No evidence of thoracic aortic aneurysm or dissection.  Brief segment of proximal IMA occlusion.  Large geographic filling defect of the left atrium with associated dense radiopaque material.  Aortic atherosclerosis and emphysema  . COLONOSCOPY W/ POLYPECTOMY    . CORONARY ARTERY BYPASS GRAFT     05/2020 at [facility]  . ESOPHAGOGASTRODUODENOSCOPY (EGD) WITH PROPOFOL N/A 10/01/2020   Procedure: ESOPHAGOGASTRODUODENOSCOPY (EGD) WITH PROPOFOL;  Surgeon: Jackquline Denmark, MD;  Location: [facility] ENDOSCOPY;  Service: Endoscopy;  Laterality: N/A;  . EYE SURGERY Bilateral    Cataract surgery  . FEMORAL-POPLITEAL BYPASS GRAFT Left 09/12/2020   Procedure: LEFT FEMORAL-ABOVE KNEE POPLITEAL ARTERY BYPASS GRAFT;  Surgeon: Angelia Mould, MD;  Location: [place];  Service: Vascular;  Laterality: Left;  . HOT HEMOSTASIS N/A 10/01/2020   Procedure: HOT HEMOSTASIS (ARGON PLASMA COAGULATION/BICAP);  Surgeon: Jackquline Denmark, MD;  Location: [facility] ENDOSCOPY;  Service: Endoscopy;  Laterality: N/A;  . IR FLUORO GUIDE CV LINE RIGHT  07/06/2020  . IR US GUIDE VASC ACCESS RIGHT  07/06/2020  . TRANSTHORACIC ECHOCARDIOGRAM  07/2019    EF 60 to 65%.  GR 1 DD.  Elevated LVEDP.  Large size ill-defined echodensity in the left atrium suspicious for thrombus versus tumor.  Moderately dilated LA.  . TUBAL LIGATION     Family History  Problem Relation Age of Onset  . Diabetes Mother   . Diabetes Father   . Heart attack Father 26  . CAD Father   . Hyperlipidemia Father   . Hypertension Father    Social History:  reports that she has been smoking cigarettes. She has a 25.00 pack-year smoking history. She has never used smokeless tobacco. She reports previous alcohol use. She reports that she does not use drugs. Allergies  Allergen Reactions  .  Penicillins Anaphylaxis    Anaphylaxis as a child   Prior to Admission medications   Medication Sig Start Date End Date Taking? Authorizing Provider  acetaminophen (TYLENOL) 650 MG CR tablet Take 1,300 mg by mouth every 8 (eight) hours as needed for pain.   Yes [provider]  amiodarone (PACERONE) 200 MG tablet Take 200 mg by mouth daily. 06/10/20 06/10/21 Yes [provider]  amLODipine (NORVASC) 5 MG tablet Take 5 mg by mouth daily. 06/10/20 06/10/21 Yes [provider]  apixaban (ELIQUIS) 5 MG TABS tablet Take 1 tablet (5 mg total) by mouth 2 (two) times daily. 12/10/19  Yes Leonie Man, MD  Ascorbic Acid (VITAMIN C) 1000 MG tablet Take 1,000 mg by mouth every evening.   Yes [provider]  aspirin EC 81 MG tablet Take 81 mg by mouth daily. Swallow whole.   Yes [provider]  atorvastatin (LIPITOR) 20 MG tablet Take 20 mg by mouth daily.   Yes [provider]  carvedilol (COREG) 6.25 MG tablet TAKE 1 TABLET (6.25 MG TOTAL) BY MOUTH TWO TIMES DAILY WITH A MEAL. Patient taking differently: Take 6.25 mg by mouth 2 (two) times daily with a meal. 07/15/20  07/15/21 Yes Pahwani, Einar Grad, MD  collagenase (SANTYL) ointment Apply 1 application topically every 3 (three) days. And cover with foam padded bandage. Change every 3 days or sooner if soiled. 10/03/20  Yes Ezequiel Essex, MD  diclofenac Sodium (VOLTAREN) 1 % GEL Apply 1 application topically 4 (four) times daily as needed (pain).   Yes [provider]  diphenhydramine-acetaminophen (TYLENOL PM) 25-500 MG TABS tablet Take 1 tablet by mouth at bedtime as needed (sleep).   Yes [provider]  docusate sodium (COLACE) 100 MG capsule Take 100 mg by mouth daily as needed for mild constipation.   Yes [provider]  famotidine (PEPCID) 20 MG tablet Take 20 mg by mouth daily.   Yes [provider]  folic acid (FOLVITE) 130 MCG tablet Take 800 mcg by mouth daily.    Yes [provider]  linagliptin (TRADJENTA) 5 MG TABS tablet Take 5 mg by mouth daily.   Yes [provider]  loperamide (IMODIUM A-D) 2 MG tablet Take 2 mg by mouth 4 (four) times daily as needed for diarrhea or loose stools.   Yes [provider]  neomycin-bacitracin-polymyxin (NEOSPORIN) ointment Apply 1 application topically as needed for wound care.   Yes [provider]  oxyCODONE (OXY IR/ROXICODONE) 5 MG immediate release tablet Take 5 mg by mouth 2 (two) times daily as needed for moderate pain. 06/09/20  Yes [provider]  Vitamin D3 (VITAMIN D) 25 MCG tablet Take 2,000 Units by mouth every evening.   Yes [provider]  fluticasone (FLONASE) 50 MCG/ACT nasal spray Place 2 sprays into both nostrils as needed for allergies or rhinitis.    [provider]  glucose blood test strip 1 each by Other route as needed. Use as instructed    [provider]  lidocaine-prilocaine (EMLA) cream Apply 1 application topically daily as needed (dialysis). 08/12/20   [provider]  Methoxy PEG-Epoetin Beta (MIRCERA IJ) Mircera 09/20/20   [provider]  montelukast (SINGULAIR) 10 MG tablet Take 10 mg by mouth daily as needed (allergies).    [provider]   Current Facility-Administered Medications  Medication Dose Route Frequency Provider Last Rate Last Admin  . 0.9 %  sodium chloride infusion   Intravenous Continuous Angelia Mould, MD   New Bag at 10/31/20 1034  . Chlorhexidine Gluconate Cloth 2 % PADS 6 each  6 each Topical Once Angelia Mould, MD       And  . Chlorhexidine Gluconate Cloth 2 % PADS 6 each  6 each Topical Once Angelia Mould, MD      . HYDROmorphone (DILAUDID) injection 0.25-0.5 mg  0.25-0.5 mg Intravenous Q5 min PRN Myrtie Soman, MD      . insulin aspart (novoLOG) injection 0-9 Units  0-9 Units Subcutaneous TID WC Setzer, Edman Circle, PA-C      . lactated  ringers infusion   Intravenous Continuous Myrtie Soman, MD      . ondansetron [facility]) injection 4 mg  4 mg Intravenous Once PRN Myrtie Soman, MD       Labs: Basic Metabolic Panel: Recent Labs  Lab 10/26/20 0946 10/31/20 0629  NA 134* 133*  K 4.3 3.8  CL 98 97*  CO2 22  --   GLUCOSE 94 81  BUN 26* 21  CREATININE 3.53* 4.00*  CALCIUM 8.8*  --    Liver Function Tests: Recent Labs  Lab 10/26/20 0946  AST 41  ALT 24  ALKPHOS 211*  BILITOT 0.2*  PROT 6.6  ALBUMIN 2.5*   CBC: Recent Labs  Lab 10/26/20 0946 10/31/20 0629  WBC 21.2*  --   HGB 11.5* 11.2*  HCT 37.2 33.0*  MCV 100.8*  --   PLT 437*  --    CBG: Recent Labs  Lab 10/26/20 0901 10/31/20 0552 10/31/20 1112  GLUCAP 109* 88 65*    ROS: All others negative except those listed in HPI.  Physical Exam: Vitals:   10/31/20 1110 10/31/20 1125 10/31/20 1140 10/31/20 1155  BP: (!) 119/46 (!) 100/38 (!) 100/43 (!) 95/44  Pulse: (!) 58 (!) 58 (!) 58 (!) 58  Resp: 17 (!) 8 (!) 7 (!) 5  Temp: (!) 97 F (36.1 C)     TempSrc:      SpO2: 100% 99% 99% 97%  Weight:      Height:         General: pale, chronically ill appearing female in NAD Head: NCAT sclera not icteric MMM Neck: Supple. No lymphadenopathy Lungs: CTA bilaterally. No wheeze, rales or rhonchi. Breathing is unlabored. Heart: RRR. No murmur, rubs or gallops.  Abdomen: soft, nontender, +BS, no guarding, no rebound tenderness Lower extremities:1-2+ edema on L, trace edema on R with gangrenous toes Neuro: AAOx3. Moves all extremities spontaneously. Psych:  Responds to questions appropriately with a normal affect. Dialysis Access: LU AVF +b/t, R IJ TDC  Dialysis Orders:  TTS - [place]  3hrs 31min, BFR 400, DFR 500,  EDW 59.7kg, 3K/ 2.5Ca  Access: TDC, LU AVF maturing  Heparin none Mircera 100 mcg q2wks - last 186mcg on 5/10 Venofer 100mg  qHD x10 (3 of 10 completed)    Assessment/Plan: 1.  PAD w/gangrene b/l toes - s/p R femoropopliteal  bypass grafting. Per VVS 2.  ESRD -  On HD TTS. Plan for HD tomorrow per regular schedule. K 3.8.  3.  Hypertension/volume  - BP soft post procedure. Continue home meds when appropriate. LLE edema present, does not appear grossly volume overloaded.  Plan for UF as tolerated.  4.  Anemia of CKD - Hgb 11.2.  ESA due tomorrow.  Will hold off due to Hgb>11.  5.  Secondary Hyperparathyroidism -  Ca at goal. Not on VDRA. Check phos. Continue auryxia 1AC TID.  6.  Nutrition - Renal diet w/fluid restrictions. 7. A fib - on amiodarone and eliquis 8.  AAA 9. CAD  Jen Mow, PA-C [facility] 10/31/2020, 12:01 PM

## 2020-10-31 NOTE — Progress Notes (Cosign Needed)
  Day of Surgery Note    Subjective:  "Just sore." She denies nausea or chest pain. RN at bedside. She reports small amount of oozing from distal saphenectomy incision. She also reports non-stageable sacral decubitus ulcer treated with Santyl by daughter at home  Vitals:   10/31/20 1155 10/31/20 1243  BP: (!) 95/44 (!) 135/59  Pulse: (!) 58 60  Resp: (!) 5 16  Temp: 98 F (36.7 C) 97.7 F (36.5 C)  SpO2: 97% 100%    Incisions:   All well approximated. Distal saphenectomy incision covered with 2 x 2 gauze pad. This is dry.  Extremities:  Dry gangrene of right toes and small area of skin breakdown on right heel. Small, soft hematoma medial to AK popliteal artery exposure incision. Associated ecchymosis. I outlined this in ink. Cardiac:  RRR Lungs:  Non-labored respirations    Assessment/Plan:  This is a 70 y.o. female who is s/p right femoral artery to AK bypass with ipsilateral GSV graft. VSS. Pain controlled. Small distal thight hematoma. I placed an ABD pad and lightly wrapped this area of her thigh with an Ace wrap.  Pressure sores: RN has consulted wound care. Float heels.  ESRD: HD on TTS outpatient schedule. Dr. Augustin Coupe consulted.  History of pAF on Eliquis>likely restart in AM depending on distal thigh hematoma, labs.  -Continue current care plan   Risa Grill, PA-C 10/31/2020 1:01 PM [phone]

## 2020-10-31 NOTE — Progress Notes (Signed)
Pt has multiple unstageable/eschar wounds on bilateral side of buttocks and coccyx. Pt states this all started in December. Pt states daughter helps her dress her wounds and that they ran out of the cream that was given last admission and was unable to get medication refilled. Wound consult placed and air mattress ordered for pt. Will continue to monitor    Phoebe Sharps, RN

## 2020-10-31 NOTE — Op Note (Signed)
NAME: Kathryn Hancock    MRN: [id number] DOB: 1951/04/27    DATE OF OPERATION: 10/31/2020  PREOP DIAGNOSIS:    Dry gangrene right foot with peripheral vascular disease  POSTOP DIAGNOSIS:    Same  PROCEDURE:    Right femoral to above-knee popliteal artery bypass with nonreversed translocated saphenous vein graft  SURGEON: Judeth Cornfield. Scot Dock, MD  ASSIST: Risa Grill, PA  ANESTHESIA: General  EBL: Minimal  INDICATIONS:    Kathryn Hancock is a 70 y.o. female who embolized to both feet and had dry gangrene of the toes of both feet.  She had undergone a left femoral to above-knee popliteal artery bypass with PTFE as the vein on the left was not adequate.  She presents now for revascularization on the right.  FINDINGS:   The saphenous vein on the right was reasonable with a diameter of about 4 to 4-1/2 mm.  She had a brisk anterior tibial and posterior tibial signal at the completion of the procedure.  TECHNIQUE:   The patient was taken to the operating room and received a general anesthetic.  I looked at the saphenous vein myself with the SonoSite and I felt it was reasonable in size.  The right leg was prepped and draped in usual sterile fashion.  A longitudinal incision was made in the right groin and the dissection carried down to the common femoral artery which had a significant amount of posterior medial plaque.  There was a good pulse anteriorly I dissected well up under the inguinal ligament to find a soft spot to clamp.  Multiple side branches were controlled with Vesseloops.  The superficial femoral and deep femoral arteries were controlled.  I felt that I could originate the graft in the distal common femoral artery where the artery was soft and had a good pulse.  Through this incision I dissected free the saphenofemoral junction.  Using 3 additional incisions along the medial aspect of the right leg the great saphenous vein was harvested to the knee with branches divided  between clips and 3-0 silk ties.  Through the distal incision I was able to expose the above-knee popliteal artery.  The vein was ligated distally and removed from its bed.  It was gently irrigated with heparinized saline.  A tunnel was then created from the distal incision to the groin incision and the patient was then heparinized.  The saphenofemoral junction was clamped with a Satinsky clamp and the vein excised from the femoral vein.  The femoral vein was oversewn with a 5-0 Prolene suture.  Use the vein in a nonreverse fashion.  The vein was spatulated.  The proximal valve was excised.  I then clamped the external iliac artery and then controlled the superficial femoral and deep femoral arteries with loops in addition to the multiple side branches.  A longitudinal arteriotomy was made in the soft spot in the distal common femoral artery and this was extended with Potts scissors.  The vein was sewn in a nonreverse fashion end-to-side to the distal common femoral artery using continuous 5-0 Prolene suture.  Prior to completing the anastomosis the artery was backbled and flushed and there was excellent inflow.  The anastomosis was completed.  I then used the retrograde Mills valvulotome to lyse the valves.  This was passed twice with excellent flow established to the graft.  The graft was flushed with heparinized saline and clipped.  It was marked to prevent twisting.  It was then brought through the previously created  tunnel for anastomosis to the above-knee popliteal artery.  A tourniquet was placed on the thigh.  The leg was exsanguinated with an Esmarch bandage.  The tourniquet was plated to 250 mmHg.  Under tourniquet control a longitudinal arteriotomy was made in the above-knee popliteal artery.  The vein was cut to the appropriate length spatulated and sewn end-to-side to the above-knee popliteal artery using a continuous 6-0 Prolene suture.  Prior to completing anastomosis the tourniquet was released.   The artery was backbled and flushed appropriately and the anastomosis completed.  At the completion there was an excellent anterior tibial and posterior tibial signal with a Doppler.  Hemostasis was obtained in the wounds.  The heparin was partially reversed with protamine.  The 2 incisions in the mid thigh were irrigated and hemostasis obtained.  These were closed with 2 deep layers of 3-0 Vicryl and the skin closed with 4-0 Monocryl.  The groin incision was irrigated, hemostasis obtained.  This was then closed with a deep layer of 2-0 Vicryl, a subcutaneous layer with 3-0 Vicryl and the skin closed with 4-0 Vicryl.  The distal incision was irrigated and hemostasis obtained.  This was closed with 2 deep layers of 3-0 Vicryl and the skin closed with a 4-0 Monocryl.  Dermabond was applied.  The patient tolerated the procedure well was transferred to the recovery room in stable condition.  All needle and sponge counts were correct.  Given the complexity of the case a first assistant was necessary in order to expedient the procedure and safely perform the technical aspects of the operation.  Deitra Mayo, MD, FACS [facility]  DATE OF DICTATION:   10/31/2020

## 2020-10-31 NOTE — Transfer of Care (Signed)
Immediate Anesthesia Transfer of Care Note  Patient: Kathryn Hancock  Procedure(s) Performed: RIGHT BYPASS GRAFT FEMORAL- ABOVE KNEE POPLITEAL ARTERY (Right Leg Upper)  Patient Location: PACU  Anesthesia Type:General  Level of Consciousness: awake, alert  and oriented  Airway & Oxygen Therapy: Patient Spontanous Breathing and Patient connected to nasal cannula oxygen  Post-op Assessment: Report given to RN, Post -op Vital signs reviewed and stable and Patient moving all extremities X 4  Post vital signs: Reviewed and stable  Last Vitals:  Vitals Value Taken Time  BP 119/46 10/31/20 1110  Temp    Pulse 58 10/31/20 1112  Resp 13 10/31/20 1112  SpO2 100 % 10/31/20 1112  Vitals shown include unvalidated device data.  Last Pain:  Vitals:   10/31/20 0614  TempSrc:   PainSc: 4       Patients Stated Pain Goal: 2 (97/58/83 2549)  Complications: No complications documented.

## 2020-10-31 NOTE — Evaluation (Signed)
Physical Therapy Evaluation Patient Details Name: Kathryn Hancock MRN: [id number] DOB: 1950-09-12 Today's Date: 10/31/2020   History of Present Illness  Kathryn Hancock  has presented today for surgery, with the diagnosis of PERIPHERAL VASCULAR DISEASE WITH GANGRENE.  The various methods of treatment have been discussed with the patient and family. After consideration of risks, benefits and other options for treatment, the patient has consented to  Procedure(s):  RIGHT BYPASS GRAFT FEMORAL-POPLITEAL ARTERY (Right) as a surgical intervention. s/p L re-vascularization in the last month or so. Multiple wounds on bottom.  Clinical Impression  Patient received sleeping in bed, responds to my voice and is agreeable to PT assessment. Patient reports mod pain in right LE with movement. Reports fatigue as well. She is pleasant and cooperative. Limited ambulator with RW at baseline. Patient requires min assist with bed mobility this session. Declines attempting to transfer to recliner or walk at this time. She will continue to benefit from skilled PT while here to improve strength and functional independence.       Follow Up Recommendations Home health PT;Supervision/Assistance - 24 hour    Equipment Recommendations  None recommended by PT    Recommendations for Other Services       Precautions / Restrictions Precautions Precautions: Fall Restrictions Weight Bearing Restrictions: No      Mobility  Bed Mobility Overal bed mobility: Needs Assistance Bed Mobility: Supine to Sit;Sit to Supine     Supine to sit: Min assist Sit to supine: Min assist   General bed mobility comments: patient moves fairly well, pain limited ( R LE and bottom when scooting in bed.)    Transfers                 General transfer comment: patient declined attempting to transfer to recliner today.  Ambulation/Gait             General Gait Details: patient declined  Network engineer Rankin (Stroke Patients Only)       Balance Overall balance assessment: Needs assistance Sitting-balance support: Bilateral upper extremity supported Sitting balance-Leahy Scale: Good                                       Pertinent Vitals/Pain Pain Assessment: Faces Faces Pain Scale: Hurts little more Pain Location: R LE Pain Descriptors / Indicators: Discomfort;Sore;Guarding;Grimacing Pain Intervention(s): Monitored during session;Repositioned    Home Living Family/patient expects to be discharged to:: Private residence Living Arrangements: Children Available Help at Discharge: Family;Available 24 hours/day Type of Home: House Home Access: Stairs to enter   [employer] of Steps: 1 Home Layout: One level Home Equipment: Tub bench;Hand held shower head;Bedside commode;Walker - 4 wheels Additional Comments: Daughter provides 24/7 care to pt    Prior Function Level of Independence: Independent with assistive device(s)   Gait / Transfers Assistance Needed: Using rollator for ambulation-could ambulate short community distances prior to recent sx, has a wheelchair but does not like to use.  ADL's / Homemaking Assistance Needed: Pt reports she is typically independent with ADLS (sponge bathing) but dtr assist at times; dtr helps with meds, transportation, IADLs  Comments: Pt has had 5 sx witnin 4 months, reports that she feels as if she is going "down hill" ever since December 2021     Hand Dominance   Dominant  Hand: Right    Extremity/Trunk Assessment   Upper Extremity Assessment Upper Extremity Assessment: Overall WFL for tasks assessed    Lower Extremity Assessment Lower Extremity Assessment: RLE deficits/detail RLE: Unable to fully assess due to pain RLE Coordination: decreased gross motor    Cervical / Trunk Assessment Cervical / Trunk Assessment: Normal  Communication   Communication: No difficulties   Cognition Arousal/Alertness: Awake/alert Behavior During Therapy: WFL for tasks assessed/performed Overall Cognitive Status: Within Functional Limits for tasks assessed                                        General Comments      Exercises Total Joint Exercises Ankle Circles/Pumps: AROM;5 reps;Both   Assessment/Plan    PT Assessment Patient needs continued PT services  PT Problem List Decreased strength;Decreased mobility;Decreased activity tolerance;Decreased balance;Pain       PT Treatment Interventions Therapeutic exercise;Gait training;Functional mobility training;Therapeutic activities;Patient/family education;Balance training    PT Goals (Current goals can be found in the Care Plan section)  Acute Rehab PT Goals Patient Stated Goal: to return home at discharge PT Goal Formulation: With patient Time For Goal Achievement: 11/14/20 Potential to Achieve Goals: Good    Frequency Min 3X/week   Barriers to discharge        Co-evaluation               AM-PAC PT "6 Clicks" Mobility  Outcome Measure Help needed turning from your back to your side while in a flat bed without using bedrails?: A Little Help needed moving from lying on your back to sitting on the side of a flat bed without using bedrails?: A Little Help needed moving to and from a bed to a chair (including a wheelchair)?: A Lot Help needed standing up from a chair using your arms (e.g., wheelchair or bedside chair)?: A Lot Help needed to walk in hospital room?: A Lot Help needed climbing 3-5 steps with a railing? : A Lot 6 Click Score: 14    End of Session   Activity Tolerance: Patient limited by pain;Patient limited by fatigue Patient left: in bed;with call bell/phone within reach;with bed alarm set Nurse Communication: Mobility status PT Visit Diagnosis: Other abnormalities of gait and mobility (R26.89);Muscle weakness (generalized) (M62.81);Pain;Difficulty in walking, not  elsewhere classified (R26.2) Pain - Right/Left: Right Pain - part of body: Leg    Time: 6333-5456 PT Time Calculation (min) (ACUTE ONLY): 23 min   Charges:   PT Evaluation $PT Eval Moderate Complexity: 1 Mod PT Treatments $Therapeutic Activity: 8-22 mins        [employer], PT, GCS 10/31/20,3:42 PM

## 2020-10-31 NOTE — Interval H&P Note (Signed)
History and Physical Interval Note:  10/31/2020 6:55 AM  Kathryn Hancock  has presented today for surgery, with the diagnosis of PERIPHERAL VASCULAR DISEASE WITH GANGRENE.  The various methods of treatment have been discussed with the patient and family. After consideration of risks, benefits and other options for treatment, the patient has consented to  Procedure(s): RIGHT BYPASS GRAFT FEMORAL-POPLITEAL ARTERY (Right) as a surgical intervention.  The patient's history has been reviewed, patient examined, no change in status, stable for surgery.  I have reviewed the patient's chart and labs.  Questions were answered to the patient's satisfaction.     Deitra Mayo

## 2020-11-01 ENCOUNTER — Other Ambulatory Visit: Payer: Self-pay

## 2020-11-01 ENCOUNTER — Encounter (HOSPITAL_COMMUNITY): Payer: Self-pay | Admitting: Vascular Surgery

## 2020-11-01 DIAGNOSIS — L8990 Pressure ulcer of unspecified site, unspecified stage: Secondary | ICD-10-CM | POA: Insufficient documentation

## 2020-11-01 LAB — CBC
HCT: 29.1 % — ABNORMAL LOW (ref 36.0–46.0)
Hemoglobin: 9.2 g/dL — ABNORMAL LOW (ref 12.0–15.0)
MCH: 30.8 pg (ref 26.0–34.0)
MCHC: 31.6 g/dL (ref 30.0–36.0)
MCV: 97.3 fL (ref 80.0–100.0)
Platelets: 259 10*3/uL (ref 150–400)
RBC: 2.99 MIL/uL — ABNORMAL LOW (ref 3.87–5.11)
RDW: 18.6 % — ABNORMAL HIGH (ref 11.5–15.5)
WBC: 13.4 10*3/uL — ABNORMAL HIGH (ref 4.0–10.5)
nRBC: 0 % (ref 0.0–0.2)

## 2020-11-01 LAB — BASIC METABOLIC PANEL
Anion gap: 13 (ref 5–15)
BUN: 32 mg/dL — ABNORMAL HIGH (ref 8–23)
CO2: 20 mmol/L — ABNORMAL LOW (ref 22–32)
Calcium: 8.3 mg/dL — ABNORMAL LOW (ref 8.9–10.3)
Chloride: 98 mmol/L (ref 98–111)
Creatinine, Ser: 4.6 mg/dL — ABNORMAL HIGH (ref 0.44–1.00)
GFR, Estimated: 10 mL/min — ABNORMAL LOW (ref >60)
Glucose, Bld: 217 mg/dL — ABNORMAL HIGH (ref 70–99)
Potassium: 4.7 mmol/L (ref 3.5–5.1)
Sodium: 131 mmol/L — ABNORMAL LOW (ref 135–145)

## 2020-11-01 LAB — PHOSPHORUS: Phosphorus: 6.2 mg/dL — ABNORMAL HIGH (ref 2.5–4.6)

## 2020-11-01 LAB — LIPID PANEL
Cholesterol: 62 mg/dL (ref 0–200)
HDL: 32 mg/dL — ABNORMAL LOW (ref >40)
LDL Cholesterol: 17 mg/dL (ref 0–99)
Total CHOL/HDL Ratio: 1.9 RATIO
Triglycerides: 66 mg/dL (ref <150)
VLDL: 13 mg/dL (ref 0–40)

## 2020-11-01 LAB — GLUCOSE, CAPILLARY
Glucose-Capillary: 183 mg/dL — ABNORMAL HIGH (ref 70–99)
Glucose-Capillary: 182 mg/dL — ABNORMAL HIGH (ref 70–99)
Glucose-Capillary: 238 mg/dL — ABNORMAL HIGH (ref 70–99)

## 2020-11-01 MED ORDER — DARBEPOETIN ALFA 100 MCG/0.5ML IJ SOSY: 100.0000 ug | PREFILLED_SYRINGE | INTRAMUSCULAR | Status: DC

## 2020-11-01 MED ORDER — DARBEPOETIN ALFA 100 MCG/0.5ML IJ SOSY
PREFILLED_SYRINGE | INTRAMUSCULAR | Status: AC
  Administered 2020-11-01: 100 ug via INTRAVENOUS
  Filled 2020-11-01: qty 0.5

## 2020-11-01 MED ORDER — HEPARIN SODIUM (PORCINE) 1000 UNIT/ML IJ SOLN
INTRAMUSCULAR | Status: AC
  Filled 2020-11-01: qty 4

## 2020-11-01 MED ORDER — BACITRACIN-NEOMYCIN-POLYMYXIN OINTMENT TUBE
TOPICAL_OINTMENT | Freq: Every day | CUTANEOUS | Status: DC
  Filled 2020-11-01: qty 14

## 2020-11-01 MED ORDER — HEPARIN SODIUM (PORCINE) 5000 UNIT/ML IJ SOLN: 5000.0000 [IU] | Freq: Three times a day (TID) | INTRAMUSCULAR | Status: DC

## 2020-11-01 MED ORDER — APIXABAN 5 MG PO TABS
5.0000 mg | ORAL_TABLET | Freq: Two times a day (BID) | ORAL | Status: DC
  Administered 2020-11-01 – 2020-11-02 (×3): 5 mg via ORAL
  Filled 2020-11-01 (×3): qty 1

## 2020-11-01 NOTE — Progress Notes (Addendum)
[facility] Progress Note   Subjective:   Patient seen and examined at bedside.  Feeling well.  Denies pain, n/v/d, SOB, CP, and orthopnea.  Plan for HD today.   Objective Vitals:   10/31/20 2025 10/31/20 2312 11/01/20 0420 11/01/20 0739  BP: (!) 97/53 (!) 111/51 (!) 106/50 (!) 107/59  Pulse: 70 71 75 79  Resp: 20 16 14 14   Temp: 98.4 F (36.9 C) 97.8 F (36.6 C) 98.5 F (36.9 C) 98.5 F (36.9 C)  TempSrc: Oral Oral Oral Oral  SpO2: 98% 100% 98% 98%  Weight:      Height:       Physical Exam General: chronically ill appearing female in NAD Heart:RRR, no MRG Lungs:CTAB, nml WOB on RA Abdomen:soft, NTND Extremities:1+ LE edema, L>R, gangrenous changes to b/l toes Dialysis Access: LU AVF +b/t - maturing, R IJ [facility]   Filed Weights   10/31/20 0553  Weight: 60.6 kg    Intake/Output Summary (Last 24 hours) at 11/01/2020 0858 Last data filed at 11/01/2020 0616 Gross per 24 hour  Intake 500 ml  Output 575 ml  Net -75 ml    Additional Objective Labs: Basic Metabolic Panel: Recent Labs  Lab 10/26/20 0946 10/31/20 0629 11/01/20 0102  NA 134* 133* 131*  K 4.3 3.8 4.7  CL 98 97* 98  CO2 22  --  20*  GLUCOSE 94 81 217*  BUN 26* 21 32*  CREATININE 3.53* 4.00* 4.60*  CALCIUM 8.8*  --  8.3*   Liver Function Tests: Recent Labs  Lab 10/26/20 0946  AST 41  ALT 24  ALKPHOS 211*  BILITOT 0.2*  PROT 6.6  ALBUMIN 2.5*   CBC: Recent Labs  Lab 10/26/20 0946 10/31/20 0629 11/01/20 0102  WBC 21.2*  --  13.4*  HGB 11.5* 11.2* 9.2*  HCT 37.2 33.0* 29.1*  MCV 100.8*  --  97.3  PLT 437*  --  259   CBG: Recent Labs  Lab 10/31/20 1237 10/31/20 1448 10/31/20 1655 10/31/20 2119 11/01/20 0603  GLUCAP 67* 194* 223* 230* 183*    Medications: . sodium chloride     . amiodarone  200 mg Oral Daily  . amLODipine  5 mg Oral Daily  . aspirin EC  81 mg Oral Daily  . atorvastatin  20 mg Oral Daily  . carvedilol  6.25 mg Oral BID WC  . Chlorhexidine  Gluconate Cloth  6 each Topical Q0600  . cholecalciferol  2,000 Units Oral QPM  . docusate sodium  100 mg Oral Daily  . folic acid  1 mg Oral Daily  . insulin aspart  0-9 Units Subcutaneous TID WC  . linagliptin  5 mg Oral Daily  . neomycin-bacitracin-polymyxin   Topical Daily  . pantoprazole  40 mg Oral Daily    Dialysis Orders: TTS - [place]  3hrs 52min, BFR 400, DFR 500,  EDW 59.7kg, 3K/ 2.5Ca  Access: TDC, LU AVF maturing  Heparin none Mircera 100 mcg q2wks - last 120mcg on 5/10 Venofer 100mg  qHD x10 (3 of 10 completed)    Assessment/Plan: 1.  PAD w/gangrene b/l toes - s/p R femoropopliteal bypass grafting 10/31/20. Per [facility] 2.  ESRD -  On HD TTS. HD today per regular schedule. K 4.7.  3.  Hypertension/volume  - BP mostly in goal. Continue home meds when appropriate. LLE edema present, does not appear grossly volume overloaded.  Plan for UF as tolerated.  4.  Anemia of CKD - Hgb 9.2, drop from 11.2 post  procedure.  ESA due today - aranesp 626mcg ordered.  5.  Secondary Hyperparathyroidism -  Ca at goal. Not on VDRA. Check phos. Continue auryxia 1AC TID.  6.  Nutrition - Renal diet w/fluid restrictions. 7. A fib - on amiodarone and eliquis 8.  AAA 9. CAD   Jen Mow, PA-C [facility] 11/01/2020,8:58 AM  LOS: 1 day   I have seen and examined this patient and agree with the plan of care. Seen on HD at 105PM RIJ  TC 3K bath  105/51 3L net UF as tolerated. Lt BBT still pulsatile.  Dwana Melena, MD 11/01/2020, 1:13 PM

## 2020-11-01 NOTE — Consult Note (Signed)
[place] Nurse Consult Note: Patient receiving care in [place]. Patient is on a low air loss mattress and able to turn and reposition self in bed. Reason for Consult: unstageable wounds on buttocks with eschar Wound type: multiple unstageable wounds on buttocks with eschar Pressure Injury POA: Yes Measurement: The right posterior iliac crest area eschar measures 5.3 cm x 11.3 cm. The left buttock wound measures 6 cm x 2.5 cm. To the medial aspect of the left buttock wound there is a smaller eschar that measures 1.1 cm x 2.2 cm. All of these are dry, stable black eschar wounds; none have induration or surrounding erythema. The patient tells me these all started after her open heart surgery in December of 2021.  At some point in the past, she was prescribed the enzymatic debriding agent Santyl, but she could not afford it.  She and her daughter have been using OTC neosporin.  Wound bed: Drainage (amount, consistency, odor)  Periwound: Dressing procedure/placement/frequency: Although it is not an approach I would typically order, neosporin and dressings have been doing a decent job of taking care of the wounds, and it is an approach the patient can afford. Therefore, I have ordered daily application of neosporin and foam dressings. Thank you for the consult.  Discussed plan of care with the patient.  [place] nurse will not follow at this time.  Please re-consult the [place] team if needed.  Val Riles, RN, MSN, CWOCN, CNS-BC, pager [phone]

## 2020-11-01 NOTE — Plan of Care (Signed)
  Problem: Clinical Measurements: Goal: Respiratory complications will improve Outcome: Progressing Goal: Cardiovascular complication will be avoided Outcome: Progressing   Problem: Activity: Goal: Risk for activity intolerance will decrease Outcome: Progressing   Problem: Nutrition: Goal: Adequate nutrition will be maintained Outcome: Progressing   

## 2020-11-01 NOTE — Progress Notes (Addendum)
  Progress Note    11/01/2020 6:55 AM 1 Day Post-Op  Subjective:  Says her foot didn't throb last night and she rested.   Afebrile HR 60's-70's NSR 29'U-765'Y systolic 65%  Vitals:   03/54/65 2312 11/01/20 0420  BP: (!) 111/51 (!) 106/50  Pulse: 71 75  Resp: 16 14  Temp: 97.8 F (36.6 C) 98.5 F (36.9 C)  SpO2: 100% 98%    Physical Exam: Cardiac:  regular Lungs:  Non labored Incisions:  All incisions look good.  The distal incision with small hematoma and some ecchymosis.   Extremities:  Brisk right PT/peroneal and faint right AT doppler signal.  Brisk left PT/DP and faint peroneal doppler signal Bilateral toes gangrenous      CBC    Component Value Date/Time   WBC 13.4 (H) 11/01/2020 0102   RBC 2.99 (L) 11/01/2020 0102   HGB 9.2 (L) 11/01/2020 0102   HGB 9.8 (L) 05/26/2020 1158   HCT 29.1 (L) 11/01/2020 0102   HCT 29.6 (L) 05/26/2020 1158   PLT 259 11/01/2020 0102   PLT 286 05/26/2020 1158   MCV 97.3 11/01/2020 0102   MCV 100 (H) 05/26/2020 1158   MCH 30.8 11/01/2020 0102   MCHC 31.6 11/01/2020 0102   RDW 18.6 (H) 11/01/2020 0102   RDW 14.1 05/26/2020 1158   LYMPHSABS 1.9 09/29/2020 1242   MONOABS 0.5 09/29/2020 1242   EOSABS 0.3 09/29/2020 1242   BASOSABS 0.1 09/29/2020 1242    BMET    Component Value Date/Time   NA 131 (L) 11/01/2020 0102   NA 139 05/26/2020 1158   K 4.7 11/01/2020 0102   CL 98 11/01/2020 0102   CO2 20 (L) 11/01/2020 0102   GLUCOSE 217 (H) 11/01/2020 0102   BUN 32 (H) 11/01/2020 0102   BUN 75 (H) 05/26/2020 1158   CREATININE 4.60 (H) 11/01/2020 0102   CALCIUM 8.3 (L) 11/01/2020 0102   GFRNONAA 10 (L) 11/01/2020 0102   GFRAA 16 (L) 05/26/2020 1158    INR    Component Value Date/Time   INR 1.9 (H) 10/26/2020 0946     Intake/Output Summary (Last 24 hours) at 11/01/2020 0655 Last data filed at 11/01/2020 0616 Gross per 24 hour  Intake 700 ml  Output 575 ml  Net 125 ml     Assessment/Plan:  70 y.o. female is s/p:   Right femoral to above-knee popliteal artery bypass with nonreversed translocated saphenous vein graft  1 Day Post-Op   -pt with brisk doppler signals bilaterally -pt with right heel wound-float right foot -pt with acute blood loss anemia-pt tolerating and saphenous vein harvest site stable.  Ace wrap placed back for gentle pressure.   Pt on Eliquis-will d/w Dr. Scot Dock when to restart. -mobilize pt today -ESRD-dialysis per renal service    Leontine Locket, PA-C [facility] [phone] 11/01/2020 6:55 AM  I have interviewed the patient and examined the patient. I agree with the findings by the PA.  Her bypass graft is patent.  She dialyzes Tuesdays Thursdays and Saturdays.  We will follow the wounds on both feet as an outpatient.  Home when she is ambulating.  She is on aspirin and is on a statin.  I have started subcu heparin for DVT prophylaxis  Gae Gallop, MD [phone]

## 2020-11-01 NOTE — Progress Notes (Signed)
PT Cancellation Note  Patient Details Name: Axel Meas MRN: [id number] DOB: 1950/12/16   Cancelled Treatment:    Reason Eval/Treat Not Completed: (P) Patient at procedure or test/unavailable (pt at HD dept.) Will continue efforts per PT POC as schedule permits.   Verline Kong M Indiyah Paone 11/01/2020, 12:12 PM

## 2020-11-01 NOTE — Evaluation (Signed)
Occupational Therapy Evaluation Patient Details Name: Kathryn Hancock MRN: [id number] DOB: June 19, 1950 Today's Date: 11/01/2020    History of Present Illness Kathryn Hancock is a 70 y.o. 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  PMH includes: AAA, CAD, DM, dpression, HTN, PAF, PVD, L bypass graft.   Clinical Impression   PTA patient reports using rollator for mobility with independence, some assist for ADLs as needed from daughter.  Admitted for above and limited by problem list below, including impaired balance, generalized weakness, decreased activity tolerance, pain in R LE and buttocks. She currently requires min assist for bed mobility and transfers using RW, up to mod assist for LB ADLs. Based on performance today, believe shew ill benefit from further OT services while admitted and after dc at [facility] level to optimize independence and safety with ADLs, mobility to return to PLOF.     Follow Up Recommendations  Home health OT;Supervision/Assistance - 24 hour    Equipment Recommendations  None recommended by OT    Recommendations for Other Services       Precautions / Restrictions Precautions Precautions: Fall Restrictions Weight Bearing Restrictions: No      Mobility Bed Mobility Overal bed mobility: Needs Assistance Bed Mobility: Supine to Sit;Sit to Supine     Supine to sit: Min assist Sit to supine: Min assist   General bed mobility comments: min assist for trunk support to ascend, BLEs to return to supine    Transfers Overall transfer level: Needs assistance Equipment used: Rolling walker (2 wheeled) Transfers: Sit to/from Stand Sit to Stand: Min assist         General transfer comment: min assist to power up and steady from EOB with cueing for hand placement using RW    Balance Overall balance assessment: Needs assistance Sitting-balance support: No upper extremity supported;Feet supported Sitting balance-Leahy  Scale: Good     Standing balance support: Bilateral upper extremity supported;During functional activity Standing balance-Leahy Scale: Poor Standing balance comment: relies on BUE support                           ADL either performed or assessed with clinical judgement   ADL Overall ADL's : Needs assistance/impaired     Grooming: Set up;Sitting   Upper Body Bathing: Set up;Sitting   Lower Body Bathing: Moderate assistance;Sit to/from stand   Upper Body Dressing : Set up;Sitting   Lower Body Dressing: Moderate assistance;Sit to/from stand Lower Body Dressing Details (indicate cue type and reason): requires assist for R sock, able to manage L sock; minA sit to stand Toilet Transfer: Minimal assistance;RW Toilet Transfer Details (indicate cue type and reason): simulated         Functional mobility during ADLs: Minimal assistance;Rolling walker;Cueing for safety General ADL Comments: pt limited by pain, decreased functional ROM of R LE, and impaired balance     Vision         Perception     Praxis      Pertinent Vitals/Pain Pain Assessment: Faces Faces Pain Scale: Hurts little more Pain Location: R LE, bottom Pain Descriptors / Indicators: Discomfort;Sore;Guarding;Grimacing Pain Intervention(s): Monitored during session;Repositioned;Limited activity within patient's tolerance     Hand Dominance Right   Extremity/Trunk Assessment Upper Extremity Assessment Upper Extremity Assessment: Overall WFL for tasks assessed   Lower Extremity Assessment Lower Extremity Assessment: Defer to PT evaluation   Cervical / Trunk Assessment Cervical / Trunk Assessment:  Normal   Communication Communication Communication: No difficulties   Cognition Arousal/Alertness: Awake/alert Behavior During Therapy: WFL for tasks assessed/performed Overall Cognitive Status: Within Functional Limits for tasks assessed                                      General Comments  VSS    Exercises     Shoulder Instructions      Home Living Family/patient expects to be discharged to:: Private residence Living Arrangements: Children Available Help at Discharge: Family;Available 24 hours/day Type of Home: House Home Access: Stairs to enter [employer] of Steps: 1   Home Layout: One level     Bathroom Shower/Tub: Teacher, early years/pre: Standard     Home Equipment: Tub bench;Hand held shower head;Bedside commode;Walker - 4 wheels   Additional Comments: Daughter provides 24/7 care to pt      Prior Functioning/Environment Level of Independence: Independent with assistive device(s)  Gait / Transfers Assistance Needed: Using rollator for ambulation-could ambulate short community distances prior to recent sx, has a wheelchair but does not like to use. ADL's / Homemaking Assistance Needed: Pt reports she is typically independent with ADLS (sponge bathing) but dtr assist at times; dtr helps with meds, transportation, IADLs   Comments: Pt has had 5 sx witnin 4 months, reports that she feels as if she is going "down hill" ever since December 2021        OT Problem List: Decreased strength;Decreased activity tolerance;Impaired balance (sitting and/or standing);Decreased knowledge of use of DME or AE;Decreased knowledge of precautions;Pain      OT Treatment/Interventions: Self-care/ADL training;DME and/or AE instruction;Energy conservation;Therapeutic activities;Patient/family education;Balance training    OT Goals(Current goals can be found in the care plan section) Acute Rehab OT Goals Patient Stated Goal: to return home at discharge OT Goal Formulation: With patient Time For Goal Achievement: 11/15/20 Potential to Achieve Goals: Good  OT Frequency: Min 2X/week   Barriers to D/C:            Co-evaluation              AM-PAC OT "6 Clicks" Daily Activity     Outcome Measure Help from another person  eating meals?: None Help from another person taking care of personal grooming?: A Little Help from another person toileting, which includes using toliet, bedpan, or urinal?: A Little Help from another person bathing (including washing, rinsing, drying)?: A Little Help from another person to put on and taking off regular upper body clothing?: A Little Help from another person to put on and taking off regular lower body clothing?: A Little 6 Click Score: 19   End of Session Equipment Utilized During Treatment: Rolling walker Nurse Communication: Mobility status  Activity Tolerance: Patient tolerated treatment well Patient left: with call bell/phone within reach;in bed  OT Visit Diagnosis: Other abnormalities of gait and mobility (R26.89);Muscle weakness (generalized) (M62.81);Pain Pain - Right/Left: Right Pain - part of body: Leg (bottom)                Time: 1761-6073 OT Time Calculation (min): 23 min Charges:  OT General Charges $OT Visit: 1 Visit OT Evaluation $OT Eval Moderate Complexity: 1 Mod OT Treatments $Self Care/Home Management : 8-22 mins  Jolaine Artist, OT Acute Rehabilitation Services Pager [phone] Office [phone]   Delight Stare 11/01/2020, 10:55 AM

## 2020-11-01 NOTE — Plan of Care (Signed)
  Problem: Clinical Measurements: Goal: Respiratory complications will improve Outcome: Progressing Goal: Cardiovascular complication will be avoided Outcome: Progressing   Problem: Health Behavior/Discharge Planning: Goal: Ability to manage health-related needs will improve Outcome: Not Progressing   

## 2020-11-01 NOTE — Progress Notes (Signed)
Physical Therapy Treatment Patient Details Name: Kathryn Hancock MRN: [id number] DOB: [birth year] Today's Date: 11/01/2020    History of Present Illness Kathryn Hancock is a 70 y.o. 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  PMH includes: AAA, CAD, DM, depression, HTN, PAF, PVD, L bypass graft with sternotomy.    PT Comments    Pt received in supine, agreeable to therapy session and with good participation in bed mobility and transfer training. Pt limited due to fatigue after dialysis but able to stand at RW ~5 mins for wound dressing change and standing exercise/strengthening. Pt up in chair at end of session with instruction given on frequent pressure relief and able to teach back pressure relief strategies every 20 mins in chair and every 1-2 hours in bed. Pt continues to benefit from PT services to progress toward functional mobility goals. Continue to recommend HHPT.  Follow Up Recommendations  Home health PT;Supervision/Assistance - 24 hour     Equipment Recommendations  None recommended by PT    Recommendations for Other Services       Precautions / Restrictions Precautions Precautions: Fall Precaution Comments: multiple wounds on L/R buttocks Restrictions Weight Bearing Restrictions: No    Mobility  Bed Mobility Overal bed mobility: Needs Assistance Bed Mobility: Rolling;Sidelying to Sit Rolling: Modified independent (Device/Increase time) Sidelying to sit: Min guard       General bed mobility comments: use of bed features/railing    Transfers Overall transfer level: Needs assistance Equipment used: Rolling walker (2 wheeled) Transfers: Sit to/from [facility] Sit to Stand: Min guard Stand pivot transfers: Min guard       General transfer comment: from air bed (deflated) to RW and RW to recliner (with cushions), geomat cushion ordered for chair but not arrived yet at time of session, RN/US  aware  Ambulation/Gait Ambulation/Gait assistance: Min guard Gait Distance (Feet): 3 Feet Assistive device: Rolling walker (2 wheeled) Gait Pattern/deviations: Step-to pattern;Antalgic     General Gait Details: heavy reliance on RW due to pain, limited to pivot transfer due to fatigue after standing exercises and wound dressing changed   Stairs             Wheelchair Mobility    Modified Rankin (Stroke Patients Only)       Balance Overall balance assessment: Needs assistance Sitting-balance support: No upper extremity supported;Feet supported Sitting balance-Leahy Scale: Good     Standing balance support: Bilateral upper extremity supported;During functional activity Standing balance-Leahy Scale: Poor Standing balance comment: relies on BUE support                            Cognition Arousal/Alertness: Awake/alert Behavior During Therapy: WFL for tasks assessed/performed Overall Cognitive Status: Within Functional Limits for tasks assessed                                 General Comments: pleasantly cooperative      Exercises Other Exercises Other Exercises: standing BLE AROM: heel raises x10 reps ea    General Comments General comments (skin integrity, edema, etc.): VSS on RA; extensive time spent discussing pressure relief frequency/strategies in chair and bed positions for improved healing of pressure sores.      Pertinent Vitals/Pain Pain Assessment: Faces Faces Pain Scale: Hurts little more Pain Location: R LE, bottom Pain Descriptors / Indicators: Discomfort;Sore;Guarding;Grimacing Pain  Intervention(s): Monitored during session;Premedicated before session;Repositioned;Limited activity within patient's tolerance    Home Living   Living Arrangements: Children                  Prior Function            PT Goals (current goals can now be found in the care plan section) Acute Rehab PT Goals Patient Stated Goal:  to return home at discharge PT Goal Formulation: With patient Time For Goal Achievement: 11/14/20 Potential to Achieve Goals: Good Progress towards PT goals: Progressing toward goals    Frequency    Min 3X/week      PT Plan Current plan remains appropriate    Co-evaluation              AM-PAC PT "6 Clicks" Mobility   Outcome Measure  Help needed turning from your back to your side while in a flat bed without using bedrails?: None Help needed moving from lying on your back to sitting on the side of a flat bed without using bedrails?: A Little Help needed moving to and from a bed to a chair (including a wheelchair)?: A Little Help needed standing up from a chair using your arms (e.g., wheelchair or bedside chair)?: A Little Help needed to walk in hospital room?: A Lot Help needed climbing 3-5 steps with a railing? : A Lot 6 Click Score: 17    End of Session Equipment Utilized During Treatment: Gait belt Activity Tolerance: Patient tolerated treatment well;Patient limited by fatigue Patient left: in chair;with call bell/phone within reach;Other (comment) (per RN OK to leave chair alarm pad off, pad present in room now pt able to use call bell appropriately) Nurse Communication: Mobility status (geomat cushion ordered, RN/US aware) PT Visit Diagnosis: Other abnormalities of gait and mobility (R26.89);Muscle weakness (generalized) (M62.81);Pain;Difficulty in walking, not elsewhere classified (R26.2) Pain - Right/Left: Right Pain - part of body: Leg     Time: 6387-5643 PT Time Calculation (min) (ACUTE ONLY): 20 min  Charges:  $Therapeutic Activity: 8-22 mins                     Marvelle Caudill P., PTA Acute Rehabilitation Services Pager: [phone] Office: [place] 11/01/2020, 5:26 PM

## 2020-11-02 LAB — GLUCOSE, CAPILLARY
Glucose-Capillary: 134 mg/dL — ABNORMAL HIGH (ref 70–99)
Glucose-Capillary: 164 mg/dL — ABNORMAL HIGH (ref 70–99)
Glucose-Capillary: 178 mg/dL — ABNORMAL HIGH (ref 70–99)

## 2020-11-02 MED ORDER — CHLORHEXIDINE GLUCONATE CLOTH 2 % EX PADS
6.0000 | MEDICATED_PAD | Freq: Every day | CUTANEOUS | Status: DC
  Administered 2020-11-02: 6 via TOPICAL

## 2020-11-02 MED ORDER — OXYCODONE HCL 5 MG PO TABS: 5.0000 mg | ORAL_TABLET | Freq: Four times a day (QID) | ORAL | 0 refills | Status: DC | PRN

## 2020-11-02 MED ORDER — FERRIC CITRATE 1 GM 210 MG(FE) PO TABS
210.0000 mg | ORAL_TABLET | Freq: Three times a day (TID) | ORAL | Status: DC
  Administered 2020-11-02 (×2): 210 mg via ORAL
  Filled 2020-11-02 (×2): qty 1

## 2020-11-02 NOTE — Progress Notes (Signed)
Physical Therapy Treatment Patient Details Name: Kathryn Hancock MRN: [id number] DOB: 1950/12/17 Today's Date: 11/02/2020    History of Present Illness Kathryn Hancock is a 70 y.o. 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  PMH includes: AAA, CAD, DM, depression, HTN, PAF, PVD, L bypass graft with sternotomy.    PT Comments    Pt received in supine, agreeable to therapy session and with good participation and tolerance for gait and stair training. Pt able to progress to short household distance gait trial and performed step to simulate porch step with min guard and RW, pt has met transfer and gait/stair goals and anticipate she is safe to discharge with physical assist from daughter once medically cleared. Also emphasized importance of repositioning for pressure relief and frequency for positional changes in chair and bed. Pt continues to benefit from PT services to progress toward functional mobility goals. Continue to recommend HHPT.   Follow Up Recommendations  Home health PT;Supervision/Assistance - 24 hour     Equipment Recommendations  None recommended by PT;Other (comment) (geomat cushion (already in room))    Recommendations for Other Services       Precautions / Restrictions Precautions Precautions: Fall Precaution Comments: multiple wounds on L/R buttocks Restrictions Weight Bearing Restrictions: No    Mobility  Bed Mobility Overal bed mobility: Needs Assistance Bed Mobility: Rolling;Sidelying to Sit Rolling: Modified independent (Device/Increase time) Sidelying to sit: Modified independent (Device/Increase time)       General bed mobility comments: use of bed features/railing    Transfers Overall transfer level: Needs assistance Equipment used: Rolling walker (2 wheeled) Transfers: Sit to/from [facility] Sit to Stand: Min guard Stand pivot transfers: Min guard       General transfer comment: from  air bed (deflated) to RW and RW to recliner (with geomat cushion and chair alarm in place).  Ambulation/Gait Ambulation/Gait assistance: Min guard Gait Distance (Feet): 40 Feet Assistive device: Rolling walker (2 wheeled) Gait Pattern/deviations: Decreased stride length;Step-through pattern   Gait velocity interpretation: <1.8 ft/sec, indicate of risk for recurrent falls General Gait Details: improved upright posture and good use of RW this date, min guard for safety and second person present but not needing to assist; no LOB or bucking but cues for posture at end of trial needed due to pt fatigue.   Stairs Stairs: Yes Stairs assistance: Min guard;+2 safety/equipment Stair Management: With walker;Forwards;Step to pattern Number of Stairs: 1 General stair comments: pt ascended/descended single 7" step in room to simulate step up onto porch, no LOB or buckling and good safety with RW   Wheelchair Mobility    Modified Rankin (Stroke Patients Only)       Balance Overall balance assessment: Needs assistance Sitting-balance support: No upper extremity supported;Feet supported Sitting balance-Leahy Scale: Good Sitting balance - Comments: used UE on EOB due to air bed/soft surface   Standing balance support: Bilateral upper extremity supported;During functional activity Standing balance-Leahy Scale: Poor Standing balance comment: relies on BUE support                            Cognition Arousal/Alertness: Awake/alert Behavior During Therapy: WFL for tasks assessed/performed Overall Cognitive Status: Within Functional Limits for tasks assessed                                 General Comments:  pleasantly cooperative      Exercises Total Joint Exercises Ankle Circles/Pumps: AROM;Both;10 reps    General Comments General comments (skin integrity, edema, etc.): HR to 121 bpm with exertion but VSS otherwise on RA, no dizziness reported.       Pertinent Vitals/Pain Pain Assessment: Faces Faces Pain Scale: Hurts little more Pain Location: R LE, bottom Pain Descriptors / Indicators: Discomfort;Sore;Guarding;Grimacing    Home Living                      Prior Function            PT Goals (current goals can now be found in the care plan section) Acute Rehab PT Goals Patient Stated Goal: to return home at discharge PT Goal Formulation: With patient Time For Goal Achievement: 11/14/20 Potential to Achieve Goals: Good Progress towards PT goals: Progressing toward goals    Frequency    Min 3X/week      PT Plan Current plan remains appropriate       AM-PAC PT "6 Clicks" Mobility   Outcome Measure  Help needed turning from your back to your side while in a flat bed without using bedrails?: None Help needed moving from lying on your back to sitting on the side of a flat bed without using bedrails?: None Help needed moving to and from a bed to a chair (including a wheelchair)?: A Little Help needed standing up from a chair using your arms (e.g., wheelchair or bedside chair)?: A Little Help needed to walk in hospital room?: A Lot Help needed climbing 3-5 steps with a railing? : A Lot 6 Click Score: 18    End of Session Equipment Utilized During Treatment: Gait belt Activity Tolerance: Patient tolerated treatment well Patient left: in chair;with call bell/phone within reach;Other (comment) (chair alarm on, geomat cushion under her) Nurse Communication: Mobility status PT Visit Diagnosis: Other abnormalities of gait and mobility (R26.89);Muscle weakness (generalized) (M62.81);Pain;Difficulty in walking, not elsewhere classified (R26.2) Pain - Right/Left: Right Pain - part of body: Leg     Time: 1441-1457 PT Time Calculation (min) (ACUTE ONLY): 16 min  Charges:  $Gait Training: 8-22 mins                     Annalucia Laino P., PTA Acute Rehabilitation Services Pager: [phone] Office:  [place] 11/02/2020, 5:20 PM

## 2020-11-02 NOTE — Progress Notes (Addendum)
   VASCULAR SURGERY ASSESSMENT & PLAN:   POD 2 RIGHT FEM AK POP (VEIN): Her bypass graft is patent.  I can follow the dry gangrene on both feet as an outpatient.  VASCULAR QUALITY INITIATIVE: She is on aspirin and is on a statin.  DVT PROPHYLAXIS: She is on Eliquis.  SACRAL WOUNDS: Appreciate the wound care team's input.  They have recommended daily application of Neosporin with foam dressings.  And the family has been doing a good job taking care of the wounds.  PHYSICAL THERAPY: Appreciate physical therapy's help.  They have recommended home health PT with 24-hour supervision/assistance.  No equipment is needed.  END-STAGE RENAL DISEASE: Renal is following.  I believe she is on a Tuesday Thursday Saturday schedule.  DISPOSITION: She will probably need 1 more day physical therapy.  She could probably be discharged after dialysis tomorrow.  She will need home health physical therapy.  The family will have to do dressing changes to her sacral wounds with Neosporin and a foam dressing as noted by the wound care nurse.   SUBJECTIVE:   No specific complaints this morning.  She tells me she is ambulating.  PHYSICAL EXAM:   Vitals:   11/01/20 2013 11/01/20 2324 11/01/20 2326 11/02/20 0320  BP: (!) 92/45 (!) 109/51 121/72 (!) 110/52  Pulse: 79 80 81 78  Resp: 15 16 20 16   Temp: 98.3 F (36.8 C) 98.8 F (37.1 C) 98.4 F (36.9 C) 98.4 F (36.9 C)  TempSrc: Oral Oral Oral Oral  SpO2: 99% 97% 98% 96%  Weight:    65.8 kg  Height:       Good Doppler signals in the peroneal, posterior tibial, and anterior tibial positions. Small hematoma at the vein harvest site at the distal incision which is stable. The dry gangrene on her toes is stable.  LABS:   CBG (last 3)  Recent Labs    11/01/20 1538 11/01/20 2019 11/02/20 0611  GLUCAP 182* 238* 134*    PROBLEM LIST:    Active Problems:   PAOD (peripheral arterial occlusive disease) (HCC)   Pressure injury of skin   CURRENT  MEDS:   . amiodarone  200 mg Oral Daily  . amLODipine  5 mg Oral Daily  . apixaban  5 mg Oral BID  . aspirin EC  81 mg Oral Daily  . atorvastatin  20 mg Oral Daily  . carvedilol  6.25 mg Oral BID WC  . Chlorhexidine Gluconate Cloth  6 each Topical Q0600  . cholecalciferol  2,000 Units Oral QPM  . darbepoetin (ARANESP) injection - DIALYSIS  100 mcg Intravenous Q Tue-HD  . docusate sodium  100 mg Oral Daily  . folic acid  1 mg Oral Daily  . insulin aspart  0-9 Units Subcutaneous TID WC  . linagliptin  5 mg Oral Daily  . neomycin-bacitracin-polymyxin   Topical Daily  . pantoprazole  40 mg Oral Daily    Deitra Mayo Office: [phone] 11/02/2020

## 2020-11-02 NOTE — TOC Transition Note (Addendum)
Transition of Care (TOC) - CM/SW Discharge Note [name] RN, BSN Transitions of Care Unit 4E- RN Case Manager See Treatment Team for direct phone #   Patient Details  Name: Kathryn Hancock MRN: [id number] Date of Birth: 05/31/1951  Transition of Care [facility]) CM/SW Contact:  Kathryn Patricia, RN Phone Number: 11/02/2020, 3:34 PM   Clinical Narrative:    Noted referral for transition of care needs. Orders for HHPT/OT. CM spoke with pt at bedside, per conversation pt states she is active with [facility] for therapy needs PTA. Asked pt if she was aware of the vascular office referral to [facility]- which she states she was and that they had called. She asked about the difference between the agencies, and this Probation officer explained the working relationship between Vascular and [facility] ([name]). Pt voiced that she thinks she would like to stay with [facility] at this time since she is familiar with them and they know her. Explained that it was her choice and it was perfectly fine to stay with [facility]- this CM would take care of letting [facility] and [facility] know her choice.   Discussed DME- pt has rollator, BSC, tub bench and w/c at home- no further DME needs noted at this time.  Daughter to transport home.  Per Renal Navigator [facility]- HD has been arranged to be done tomorrow at pt's outpt [facility]- daughter will need to pick pt up by 10am in the morning in order to get pt there for seat spot. Or pt could d/c this evening if vascular is ok with that- will have bedside RN reach out to them.   Also discussed medications with pt, pt has both Medicare and [employer], per pt she gets most of her meds through [facility] mail order with no copay. However some meds she has to get at local pharmacy- and copays can be high- such as the wound cream which was over $200. Pt states she pays for these meds out of pocket when she can but when the copays are to high she just can't pay for them.    Call made to [facility]- confirmed pt  is active with them- and they can continue [facility] services- PT/OT.   Call made to Kathryn [facility] with [facility] to let them know pt has chosen to stay with [facility] for [facility] needs.     Final next level of care: [facility] Barriers to Discharge: Continued Medical Work up   Patient Goals and CMS Choice Patient states their goals for this hospitalization and ongoing recovery are:: return home CMS Medicare.gov Compare Post Acute Care list provided to:: Patient    Discharge Placement                 home with [facility]      Discharge Plan and Services   Discharge Planning Services: CM Consult Post Acute Care Choice: Home Health          DME Arranged: N/A DME Agency: NA       HH Arranged: PT,OT [place] Agency: [facility]ryn [facility] Date [facility] Agency Contacted: 11/03/20 Time [place]: 87 Representative spoke with at [place]: [place] Determinants of Health ([place]) Interventions     Readmission Risk Interventions Readmission Risk Prevention Plan 11/02/2020  Transportation Screening Complete  Medication Review Press photographer) Complete  HRI or [place] Complete  SW Recovery Care/Counseling Consult Complete  Palliative Care Screening Not [place] Not Applicable

## 2020-11-02 NOTE — Progress Notes (Signed)
Renal Navigator notes possible plan for discharge tomorrow, 11/03/20. If it is possible to discharge her by 10am, her daughter can pick her up and take her to her outpatient HD clinic/[place] for treatment-clinic aware and can accommodate.  This will allow patient to be cared for by the staff who know her best, expedite discharge, and keep the inpt HD unit clear for patients who cannot dialyze elsewhere.  Navigator has discussed this with Renal PA, who is agreeable, PTA, CM and patient's daughter. Navigator awaits update from PTA regarding whether patient will need another PT session in the AM after this afternoon's session as to whether to contact MD about an early discharge.  Navigator following closely.   Alphonzo Cruise, [place] Renal Navigator [phone]

## 2020-11-02 NOTE — Progress Notes (Signed)
Renal Navigator appreciates great teamwork and communication to collaborate in patient's care. PTA reports that patient did great with mobility today and that she will see her again tomorrow by patient request, but can discharge from a PT perspective. CM has arranged [place] and states patient is ready for discharge from her perspective as well. If patient is able to discharge today, she can go to her regular seat time at her outpatient clinic tomorrow in [place]. RN spoke with MD who is agreeable to discharge today. Patient's daughter can pick up at [place] per RN. Navigator updated [place] HD clinic to state that patient will be there tomorrow at her regular seat time. Renal PA sent orders. Navigator spoke with daughter who is aware that there are no changes to patient's HD tomorrow and is very Patent attorney. Navigator had a long conversation with patient for support and to update on the plan. She was tearful at times, stating that she sometimes feels pity for herself. Navigator encouraged emotion and patient said, "you're gonna make me cry." She also misses family members who have passed. We talked about the waves of grief and how this will continue, ebb and flow, throughout her life, and how this is a normal part of missing someone she loves. I encouraged her to allow herself to cry and feel sorry for her medical situation, but asked patient to evaluate if she feels sad more often than not and if she feels she is crying more than she isn't. She reports she thinks she is feeling as mentally healthy as she ever has, but appreciates being told that it is okay to cry. Navigator thanked her for being able to share her feelings and asked her to talk with someone if she ever feels she is "down" more than not. I talked about the prevalence of mental health concerns with chronic illness and how we want to care for the whole person and not just a patient's physical issues. Patient was appreciative. She is clearly a spiritual  person, whose faith is important to her. Navigator thanked her for the visit today.  Alphonzo Cruise, [place] Renal Navigator [phone]

## 2020-11-02 NOTE — Discharge Instructions (Signed)
Information on my medicine - ELIQUIS (apixaban)  This medication education was reviewed with me or my healthcare representative as part of my discharge preparation.  The pharmacist that spoke with me during my hospital stay was:    Why was Eliquis prescribed for you? Eliquis was prescribed for you to reduce the risk of a blood clot forming that can cause a stroke if you have a medical condition called atrial fibrillation (a type of irregular heartbeat).  What do You need to know about Eliquis ? Take your Eliquis TWICE DAILY - one tablet in the morning and one tablet in the evening with or without food. If you have difficulty swallowing the tablet whole please discuss with your pharmacist how to take the medication safely.  Take Eliquis exactly as prescribed by your doctor and DO NOT stop taking Eliquis without talking to the doctor who prescribed the medication.  Stopping may increase your risk of developing a stroke.  Refill your prescription before you run out.  After discharge, you should have regular check-up appointments with your healthcare provider that is prescribing your Eliquis.  In the future your dose may need to be changed if your kidney function or weight changes by a significant amount or as you get older.  What do you do if you miss a dose? If you miss a dose, take it as soon as you remember on the same day and resume taking twice daily.  Do not take more than one dose of ELIQUIS at the same time to make up a missed dose.  Important Safety Information A possible side effect of Eliquis is bleeding. You should call your healthcare provider right away if you experience any of the following: ? Bleeding from an injury or your nose that does not stop. ? Unusual colored urine (red or dark brown) or unusual colored stools (red or black). ? Unusual bruising for unknown reasons. ? A serious fall or if you hit your head (even if there is no bleeding).  Some medicines may  interact with Eliquis and might increase your risk of bleeding or clotting while on Eliquis. To help avoid this, consult your healthcare provider or pharmacist prior to using any new prescription or non-prescription medications, including herbals, vitamins, non-steroidal anti-inflammatory drugs (NSAIDs) and supplements.  This website has more information on Eliquis (apixaban): http://www.eliquis.com/eliquis/home      [facility]  Discharge instructions  Lower Extremity Bypass Surgery  Please refer to the following instruction for your post-procedure care. Your surgeon or physician assistant will discuss any changes with you.  Activity  You are encouraged to walk as much as you can. You can slowly return to normal activities during the month after your surgery. Avoid strenuous activity and heavy lifting until your doctor tells you it's OK. Avoid activities such as vacuuming or swinging a golf club. Do not drive until your doctor give the OK and you are no longer taking prescription pain medications. It is also normal to have difficulty with sleep habits, eating and bowel movement after surgery. These will go away with time.  Bathing/Showering  You may shower after you go home. Do not soak in a bathtub, hot tub, or swim until the incision heals completely.  Incision Care  Clean your incision with mild soap and water. Shower every day. Pat the area dry with a clean towel. You do not need a bandage unless otherwise instructed. Do not apply any ointments or creams to your incision. If you  have open wounds you will be instructed how to care for them or a visiting nurse may be arranged for you. If you have staples or sutures along your incision they will be removed at your post-op appointment. You may have skin glue on your incision. Do not peel it off. It will come off on its own in about one week. If you have a great deal of moisture in your groin, use a gauze  help keep this area dry.  Diet  Resume your normal diet. There are no special food restrictions following this procedure. A low fat/ low cholesterol diet is recommended for all patients with vascular disease. In order to heal from your surgery, it is CRITICAL to get adequate nutrition. Your body requires vitamins, minerals, and protein. Vegetables are the best source of vitamins and minerals. Vegetables also provide the perfect balance of protein. Processed food has little nutritional value, so try to avoid this.  Medications  Resume taking all your medications unless your doctor or nurse practitioner tells you not to. If your incision is causing pain, you may take over-the-counter pain relievers such as acetaminophen (Tylenol). If you were prescribed a stronger pain medication, please aware these medication can cause nausea and constipation. Prevent nausea by taking the medication with a snack or meal. Avoid constipation by drinking plenty of fluids and eating foods with high amount of fiber, such as fruits, vegetables, and grains. Take Colase 100 mg (an over-the-counter stool softener) twice a day as needed for constipation. Do not take Tylenol if you are taking prescription pain medications.  Follow Up  Our office will schedule a follow up appointment 2-3 weeks following discharge.  Please call us immediately for any of the following conditions  .Severe or worsening pain in your legs or feet while at rest or while walking .Increase pain, redness, warmth, or drainage (pus) from your incision site(s) Fever of 101 degree or higher The swelling in your leg with the bypass suddenly worsens and becomes more painful than when you were in the hospital If you have been instructed to feel your graft pulse then you should do so every day. If you can no longer feel this pulse, call the office immediately. Not all patients are given this instruction.  Leg swelling is common after leg bypass  surgery.  The swelling should improve over a few months following surgery. To improve the swelling, you may elevate your legs above the level of your heart while you are sitting or resting. Your surgeon or physician assistant may ask you to apply an ACE wrap or wear compression (TED) stockings to help to reduce swelling.  Reduce your risk of vascular disease  Stop smoking. If you would like help call [facility] at 1-800-QUIT-NOW 949-240-5962) or [place] at (864)469-0409.  Manage your cholesterol Maintain a desired weight Control your diabetes weight Control your diabetes Keep your blood pressure down  If you have any questions, please call the office at [phone]

## 2020-11-02 NOTE — Progress Notes (Signed)
Discharge instructions provided to patient and patients daughter. IV out. Monitor off CCMD notified. All medications, discharge instructions, and follow up appointments reviewed. Discharging to home with daughter.  Era Bumpers, RN

## 2020-11-02 NOTE — Progress Notes (Addendum)
[facility] Progress Note   Subjective:   Patient seen and examined at bedside.  Reports only problem is "sore on my backside." Wound care following.  Otherwise no complaints.  Denies CP, SOB, n/v/d, abdominal pain and fatigue.   Objective Vitals:   11/01/20 2324 11/01/20 2326 11/02/20 0320 11/02/20 0812  BP: (!) 109/51 121/72 (!) 110/52 (!) 115/50  Pulse: 80 81 78 68  Resp: 16 20 16 17   Temp: 98.8 F (37.1 C) 98.4 F (36.9 C) 98.4 F (36.9 C) 97.6 F (36.4 C)  TempSrc: Oral Oral Oral Oral  SpO2: 97% 98% 96% 98%  Weight:   65.8 kg   Height:       Physical Exam General:well appearing female in NAD Heart:RRR, no MRG apprecaited Lungs:CTAB, nml WOB on RA Abdomen:soft, NTND Extremities:1+ LE edema L>R, gangrenous changes to b/l toes Dialysis Access: LU AVF +b/t - maturing, R IJ [facility]   Filed Weights   11/01/20 1020 11/01/20 1411 11/02/20 0320  Weight: 63.6 kg 61.1 kg 65.8 kg    Intake/Output Summary (Last 24 hours) at 11/02/2020 0915 Last data filed at 11/02/2020 9390 Gross per 24 hour  Intake 120 ml  Output 2600 ml  Net -2480 ml    Additional Objective Labs: Basic Metabolic Panel: Recent Labs  Lab 10/26/20 0946 10/31/20 0629 11/01/20 0102  NA 134* 133* 131*  K 4.3 3.8 4.7  CL 98 97* 98  CO2 22  --  20*  GLUCOSE 94 81 217*  BUN 26* 21 32*  CREATININE 3.53* 4.00* 4.60*  CALCIUM 8.8*  --  8.3*  PHOS  --   --  6.2*   Liver Function Tests: Recent Labs  Lab 10/26/20 0946  AST 41  ALT 24  ALKPHOS 211*  BILITOT 0.2*  PROT 6.6  ALBUMIN 2.5*   CBC: Recent Labs  Lab 10/26/20 0946 10/31/20 0629 11/01/20 0102  WBC 21.2*  --  13.4*  HGB 11.5* 11.2* 9.2*  HCT 37.2 33.0* 29.1*  MCV 100.8*  --  97.3  PLT 437*  --  259    Medications: . sodium chloride     . amiodarone  200 mg Oral Daily  . amLODipine  5 mg Oral Daily  . apixaban  5 mg Oral BID  . aspirin EC  81 mg Oral Daily  . atorvastatin  20 mg Oral Daily  . carvedilol  6.25 mg  Oral BID WC  . Chlorhexidine Gluconate Cloth  6 each Topical Q0600  . cholecalciferol  2,000 Units Oral QPM  . darbepoetin (ARANESP) injection - DIALYSIS  100 mcg Intravenous Q Tue-HD  . docusate sodium  100 mg Oral Daily  . folic acid  1 mg Oral Daily  . insulin aspart  0-9 Units Subcutaneous TID WC  . linagliptin  5 mg Oral Daily  . neomycin-bacitracin-polymyxin   Topical Daily  . pantoprazole  40 mg Oral Daily    Dialysis Orders: TTS -[place] 3hrs20min, BFR400, DFR500, EDW 59.7kg,3K/2.5Ca  Access:TDC, LU AVF maturing Heparinnone Mircera150mcg q2wks - last 167mcg on 5/10 Venofer 100mg  qHD x10 (3 of 10 completed)   Assessment/Plan: 1. PAD w/gangrene b/l toes- s/p R femoropopliteal bypass grafting 10/31/20. Per [facility] 2. ESRD- On HD TTS. HD tomorrow per regular schedule. Last K 4.7. 3. Hypertension/volume- BP in goal. Continue home med - hold prior to dialysis.LLE edema present, does not appear grossly volume overloaded. Not to dry weight, plan for increased UF tomorrow as tolerated.  4. Anemiaof CKD- last Hgb 9.2,  drop from 11.2 post procedure. Aranesp 133mcg given with HD 5/24. 5. Secondary Hyperparathyroidism -Ca at goal. Not on VDRA. Phos elevated, continue auryxia 1AC TID. 6. Nutrition- Renal diet w/fluid restrictions. 7. A fib- on amiodarone and eliquis 8. AAA 9. CAD 10. Sacral wounds - wound care following.     Jen Mow, PA-C [facility] 11/02/2020,9:15 AM  LOS: 2 days   I have seen and examined this patient and agree with the plan of care. Feels better except for back/ butt soreness. HD Thursday; no absolute indication today. Breathing comfortably as well.  Dwana Melena, MD 11/02/2020, 10:01 AM

## 2020-11-03 ENCOUNTER — Telehealth: Payer: Self-pay | Admitting: Nephrology

## 2020-11-03 NOTE — Discharge Summary (Signed)
Discharge Summary  Patient ID: Kathryn Hancock [id number] 69 y.o. 1950/10/15  Admit date: 10/31/2020  Discharge date and time: 11/02/2020  6:03 PM   Admitting Physician: Angelia Mould, MD   Discharge Physician: same  Admission Diagnoses: PAOD (peripheral arterial occlusive disease) ([place]) [I77.9]  Discharge Diagnoses: same  Admission Condition: fair  Discharged Condition: fair  Indication for Admission: Gangrene right foot  Hospital Course: Kathryn Hancock is a 70 year old female who embolized to both the and had dry gangrene of the toes of both feet.  She had undergone a left femoral to above-the-knee popliteal artery bypass with PTFE.  She has done well and is now ready for right leg bypass.  She was brought in as an outpatient on 10/31/2020 and underwent right femoral to above-the-knee popliteal artery bypass with vein by Dr. Scot Dock.  She tolerated the procedure well and was admitted postoperatively.  At the time of discharge she had strong Doppler signals in her anterior tibial, posterior tibial, and peroneal arteries of the right lower extremity.  The transition of care team arranged home health physical therapy.  Gangrenous changes of both feet will be followed as an outpatient.  She was prescribed 2 to 3 days of narcotic pain medication for continued postoperative pain control.  She was discharged home in stable condition.  Consults: None  Treatments: surgery: Right femoral to above-the-knee popliteal artery bypass with vein by Dr. Scot Dock on 10/31/2020.  Discharge Exam: See progress note 11/02/20 Vitals:   11/02/20 1105 11/02/20 1625  BP: (!) 103/46 (!) 102/55  Pulse: 68 65  Resp: 17 12  Temp: 98.1 F (36.7 C) 98.2 F (36.8 C)  SpO2: 98% 100%     Disposition: Discharge disposition: 01-Home or Self Care       Patient Instructions:  Allergies as of 11/02/2020      Reactions   Penicillins Anaphylaxis   Anaphylaxis as a child      Medication List     TAKE these medications   acetaminophen 650 MG CR tablet Commonly known as: TYLENOL Take 1,300 mg by mouth every 8 (eight) hours as needed for pain.   amiodarone 200 MG tablet Commonly known as: PACERONE Take 200 mg by mouth daily.   amLODipine 5 MG tablet Commonly known as: NORVASC Take 5 mg by mouth daily.   apixaban 5 MG Tabs tablet Commonly known as: Eliquis Take 1 tablet (5 mg total) by mouth 2 (two) times daily.   aspirin EC 81 MG tablet Take 81 mg by mouth daily. Swallow whole.   atorvastatin 20 MG tablet Commonly known as: LIPITOR Take 20 mg by mouth daily.   carvedilol 6.25 MG tablet Commonly known as: COREG TAKE 1 TABLET (6.25 MG TOTAL) BY MOUTH TWO TIMES DAILY WITH A MEAL. What changed: how much to take   collagenase ointment Commonly known as: SANTYL Apply 1 application topically every 3 (three) days. And cover with foam padded bandage. Change every 3 days or sooner if soiled.   diclofenac Sodium 1 % Gel Commonly known as: VOLTAREN Apply 1 application topically 4 (four) times daily as needed (pain).   diphenhydramine-acetaminophen 25-500 MG Tabs tablet Commonly known as: TYLENOL PM Take 1 tablet by mouth at bedtime as needed (sleep).   docusate sodium 100 MG capsule Commonly known as: COLACE Take 100 mg by mouth daily as needed for mild constipation.   famotidine 20 MG tablet Commonly known as: PEPCID Take 20 mg by mouth daily.   fluticasone 50 MCG/ACT nasal spray  Commonly known as: FLONASE Place 2 sprays into both nostrils as needed for allergies or rhinitis.   folic acid 532 MCG tablet Commonly known as: FOLVITE Take 800 mcg by mouth daily.   glucose blood test strip 1 each by Other route as needed. Use as instructed   lidocaine-prilocaine cream Commonly known as: EMLA Apply 1 application topically daily as needed (dialysis).   linagliptin 5 MG Tabs tablet Commonly known as: TRADJENTA Take 5 mg by mouth daily.   loperamide 2 MG  tablet Commonly known as: IMODIUM A-D Take 2 mg by mouth 4 (four) times daily as needed for diarrhea or loose stools.   MIRCERA IJ Mircera   montelukast 10 MG tablet Commonly known as: SINGULAIR Take 10 mg by mouth daily as needed (allergies).   neomycin-bacitracin-polymyxin ointment Commonly known as: NEOSPORIN Apply 1 application topically as needed for wound care.   oxyCODONE 5 MG immediate release tablet Commonly known as: Oxy IR/ROXICODONE Take 1 tablet (5 mg total) by mouth every 6 (six) hours as needed for moderate pain. What changed: when to take this   vitamin C 1000 MG tablet Take 1,000 mg by mouth every evening.   Vitamin D3 25 MCG tablet Commonly known as: Vitamin D Take 2,000 Units by mouth every evening.      Activity: activity as tolerated Diet: regular diet Wound Care: keep wound clean and dry; paint toes with betadine  Follow-up with [facility] in 2 weeks.  Signed: Dagoberto Ligas, PA-C 11/03/2020 9:43 AM [facility] Office: [phone]

## 2020-11-03 NOTE — Telephone Encounter (Signed)
Transition of Care Contact from [place]  Date of Discharge: 11/02/20 Date of Contact: 11/03/20 Method of contact: phone - attempted  Attempted to contact patient to discuss transition of care from inpatient admission.  Patient grandson answered the phone and informed me patient was not at home, is at dialysis.  Will attempt to contact again or just follow up at dialysis.   Jen Mow, PA-C [facility] Pager: [phone]

## 2020-11-16 ENCOUNTER — Ambulatory Visit (HOSPITAL_COMMUNITY)
Admission: RE | Admit: 2020-11-16 | Discharge: 2020-11-16 | Disposition: A | Discharge status: Home / Self Care | Payer: Medicare Other | Source: Ambulatory Visit | Attending: Vascular Surgery | Admitting: Vascular Surgery

## 2020-11-16 ENCOUNTER — Other Ambulatory Visit: Payer: Self-pay

## 2020-11-16 ENCOUNTER — Ambulatory Visit (INDEPENDENT_AMBULATORY_CARE_PROVIDER_SITE_OTHER): Payer: Medicare Other | Admitting: Physician Assistant

## 2020-11-16 VITALS — BP 83/53 | HR 71 | Temp 97.4°F | Resp 20 | Ht 63.0 in | Wt 135.0 lb

## 2020-11-16 DIAGNOSIS — I739 Peripheral vascular disease, unspecified: Secondary | ICD-10-CM

## 2020-11-16 DIAGNOSIS — Z992 Dependence on renal dialysis: Secondary | ICD-10-CM | POA: Insufficient documentation

## 2020-11-16 DIAGNOSIS — I714 Abdominal aortic aneurysm, without rupture, unspecified: Secondary | ICD-10-CM

## 2020-11-16 DIAGNOSIS — N186 End stage renal disease: Secondary | ICD-10-CM | POA: Insufficient documentation

## 2020-11-16 NOTE — Progress Notes (Signed)
    Postoperative Visit   History of Present Illness   Kathryn Hancock is a 70 y.o. year old female who presents for postoperative follow-up.  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  She developed gangrene of all toes of both feet.  She underwent left femoral to above-the-knee popliteal artery bypass with PTFE on 09/12/2020.  She has done well after the surgery and most recently underwent femoral to above-the-knee popliteal bypass with vein on the right lower extremity on 10/31/2020.  She denies any purulence or signs of infection of bilateral gangrenous toes.  She also denies any rest pain.  She continues to take Eliquis.  Patient also had left second stage basilic vein transposition on 10/03/2020.  She is dialyzing via right IJ [facility] on a Tuesday Thursday Saturday schedule at the [facility] location.  She denies any signs or symptoms of steal syndrome in her left hand.  She states the incisions are healing well.  For VQI Use Only   PRE-ADM LIVING: Home  AMB STATUS: Ambulatory with Assistance   Physical Examination   Vitals:   11/16/20 1440  BP: (!) 83/53  Pulse: 71  Resp: 20  Temp: (!) 97.4 F (36.3 C)  TempSrc: Temporal  SpO2: 100%  Weight: 135 lb (61.2 kg)  Height: 5\' 3"  (1.6 m)    Left lower extremity incisions are well-healed; toes of left foot are demarcating well without any wet gangrene or sign of infection  Right groin incision healing well; mid thigh saphenectomy site with distal dehiscence however a healthy appearing wound bed; toes of right foot continue to demarcate without any signs of infection  Incisions of left arm well-healed; palpable thrill through basilic vein fistula; palpable left radial pulse   Medical Decision Making   Kathryn Hancock is a 70 y.o. year old female who presents s/p staged bilateral lower extremity bypass surgery after atheroembolic event to both feet  . Left lower extremity well-perfused with  brisk Doppler signals.  Incisions of left lower extremity are well-healed.  Toes of left foot are well demarcated.  She may end up losing her great toe and possibly other toe tips however will allow the possibility for autoamputation and continue to follow . Right lower extremity is well-perfused with brisk Doppler signals.  She does have some dehiscence from a saphenectomy incision.  We will begin wet-to-dry dressing changes with her home health agency [facility].  We discussed soaking her feet and Dial soap and water and to paint her toes with Betadine.  Plan will be to check bilateral bypass duplex at her next office visit . Left arm has a patent brachiobasilic fistula with a palpable thrill.  It is of adequate size and depth to begin using for HD as early as tomorrow 11/17/2020.  I will notify her dialysis center.  Nephrology will remove the [facility] when they are comfortable with the performance of the fistula. . She was originally seen at [facility] for an incidental finding of a 4.7 cm AAA.  We will also update surveillance with AAA duplex at her next office visit . Patient will call/return office sooner with any questions or concerns   Dagoberto Ligas PA-C [facility] Office: [phone]  Clinic MD: Dr. Scot Dock was involved in the evaluation and management plan of this patient today

## 2020-11-17 ENCOUNTER — Other Ambulatory Visit: Payer: Self-pay

## 2020-11-17 ENCOUNTER — Encounter: Payer: Self-pay | Admitting: Cardiology

## 2020-11-17 DIAGNOSIS — I714 Abdominal aortic aneurysm, without rupture, unspecified: Secondary | ICD-10-CM

## 2020-11-17 DIAGNOSIS — I739 Peripheral vascular disease, unspecified: Secondary | ICD-10-CM

## 2020-11-17 NOTE — Assessment & Plan Note (Signed)
Being followed closely by nephrology.  Also had GI follow-up.  Continue PPI.

## 2020-11-17 NOTE — Assessment & Plan Note (Signed)
Status postresection at [facility] in December 2021.  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  Follow-up echocardiogram shows no evidence of myxoma.   She remains on Eliquis postop because of thromboembolic episodes, and postop A. fib.

## 2020-11-17 NOTE — Assessment & Plan Note (Signed)
Gradually cutting down.  May be down to half pack or less per day.  She has been using it for an emotional crutch.  Smoking cessation instruction/counseling given:  counseled patient on the dangers of tobacco use, advised patient to stop smoking, and reviewed strategies to maximize success

## 2020-11-17 NOTE — Assessment & Plan Note (Signed)
Lipids checked in April were outstanding.  LDL was 22 on moderate dose of at atorvastatin (20 mg). Last A1c was 11.1 (prior to that it was 5.6)..  Currently on Tradjenta.   Questionable benefit with SGLT2 inhibitor in setting of ESRD.  Also not sure much benefit she will get from GLP-1 agonist.  Had previously been on sulfonylurea, but this was not restarted.  I suspect with an A1c of 11.1, she probably needs to be on insulin.

## 2020-11-17 NOTE — Assessment & Plan Note (Signed)
S/p L Fem-AKPop Bypass 09/12/2020 (Dr. Scot Dock) - has planned R sided surgery for 10/31/2020.  She is stable cardiac standpoint.  Following her bypass surgeries, I suspect that she would likely require some partial amputations per Dr. Scot Dock.  Continue aggressive cardiovascular risk ratification with statin, aspirin, beta-blocker and amlodipine.

## 2020-11-17 NOTE — Assessment & Plan Note (Signed)
Stable blood pressure today on amlodipine 5 mg daily, carvedilol 6.25 mg twice daily.  I have increased her carvedilol to 12.5 mg twice daily preop, but now postop she is back on lower dose.  As long as her blood pressure is stable, we will continue with current regimen.

## 2020-11-17 NOTE — Assessment & Plan Note (Addendum)
No further episodes of A. fib since her surgery.  Maintaining sinus rhythm on amiodarone.  -- was supposed to be on 100 mg daily (but this was lost in translation during her hospitalizations).  For now, I would continue with amiodarone until her vascular procedures/surgeries are totally completed.  This would avoid recurrent perioperative A. fib complications.  Would hope to wean off amiodarone once her surgical procedures are completed.  She is on carvedilol at stable dose, and on Eliquis.   Okay to hold Eliquis 2 days preop for most procedures and 3 days preop for high risk procedures including neuro/spinal procedures.

## 2020-11-17 NOTE — Assessment & Plan Note (Signed)
Also followed by Dr. Scot Dock from vascular surgery.   He had mentioned potential EVAR - but needs to take care of PAD issues / amputations first.  Defer to Dr. Scot Dock.

## 2020-11-17 NOTE — Assessment & Plan Note (Signed)
She really did not notice any anginal symptoms prior to her preop catheterization revealing severe RCA disease.  She is now status post CABG x1 in December 2023.  She is clearly a vasculopath, and surprisingly only had 1 vessel disease in conjunction with severe PAD and AAA.  Plan:  Back to 6.25 mg twice daily carvedilol along with amlodipine 5 mg daily with better control blood pressure.  On moderate dose of atorvastatin with well-controlled lipids.  Most recent LDL was 22.  Not on ARB because of ESRD.  Using amlodipine instead because of vasodilatory/claudication effect.  On aspirin plus Eliquis.  Not on Plavix

## 2020-11-21 ENCOUNTER — Telehealth: Payer: Self-pay

## 2020-11-21 NOTE — Telephone Encounter (Signed)
Home Health asked to transition to betadine paint only to help dry out the toes. Advised this was okay. Patient has follow up appointment, they will call back if any s/s of infection develop. Nurse also mentioned several necrotic areas on patient's back - she is going to call PCP and ask for a wound care referral.

## 2020-11-25 ENCOUNTER — Encounter: Payer: Self-pay | Admitting: Vascular Surgery

## 2020-11-25 ENCOUNTER — Encounter (HOSPITAL_COMMUNITY): Payer: Self-pay | Admitting: *Deleted

## 2020-11-25 ENCOUNTER — Other Ambulatory Visit: Payer: Self-pay

## 2020-11-25 ENCOUNTER — Emergency Department (HOSPITAL_COMMUNITY): Payer: Medicare Other

## 2020-11-25 ENCOUNTER — Inpatient Hospital Stay (HOSPITAL_COMMUNITY)
Admission: EM | Admit: 2020-11-25 | Discharge: 2020-12-13 | DRG: 616 | Disposition: A | Discharge status: Skilled Nursing Facility | Payer: Medicare Other | Attending: Internal Medicine | Admitting: Internal Medicine

## 2020-11-25 ENCOUNTER — Ambulatory Visit (INDEPENDENT_AMBULATORY_CARE_PROVIDER_SITE_OTHER): Payer: Medicare Other | Admitting: Vascular Surgery

## 2020-11-25 ENCOUNTER — Telehealth: Payer: Self-pay | Admitting: *Deleted

## 2020-11-25 VITALS — BP 76/44 | HR 73 | Temp 98.0°F | Resp 20 | Ht 63.0 in | Wt 135.0 lb

## 2020-11-25 DIAGNOSIS — Z992 Dependence on renal dialysis: Secondary | ICD-10-CM | POA: Diagnosis not present

## 2020-11-25 DIAGNOSIS — I959 Hypotension, unspecified: Secondary | ICD-10-CM | POA: Diagnosis present

## 2020-11-25 DIAGNOSIS — N186 End stage renal disease: Secondary | ICD-10-CM | POA: Diagnosis present

## 2020-11-25 DIAGNOSIS — L89153 Pressure ulcer of sacral region, stage 3: Secondary | ICD-10-CM | POA: Diagnosis present

## 2020-11-25 DIAGNOSIS — E1129 Type 2 diabetes mellitus with other diabetic kidney complication: Secondary | ICD-10-CM | POA: Diagnosis not present

## 2020-11-25 DIAGNOSIS — L03115 Cellulitis of right lower limb: Secondary | ICD-10-CM | POA: Diagnosis present

## 2020-11-25 DIAGNOSIS — E11621 Type 2 diabetes mellitus with foot ulcer: Secondary | ICD-10-CM | POA: Diagnosis present

## 2020-11-25 DIAGNOSIS — E440 Moderate protein-calorie malnutrition: Secondary | ICD-10-CM | POA: Diagnosis present

## 2020-11-25 DIAGNOSIS — E1122 Type 2 diabetes mellitus with diabetic chronic kidney disease: Secondary | ICD-10-CM | POA: Diagnosis present

## 2020-11-25 DIAGNOSIS — I998 Other disorder of circulatory system: Secondary | ICD-10-CM | POA: Diagnosis not present

## 2020-11-25 DIAGNOSIS — R627 Adult failure to thrive: Secondary | ICD-10-CM | POA: Diagnosis present

## 2020-11-25 DIAGNOSIS — I2510 Atherosclerotic heart disease of native coronary artery without angina pectoris: Secondary | ICD-10-CM | POA: Diagnosis present

## 2020-11-25 DIAGNOSIS — I714 Abdominal aortic aneurysm, without rupture: Secondary | ICD-10-CM | POA: Diagnosis present

## 2020-11-25 DIAGNOSIS — Z95828 Presence of other vascular implants and grafts: Secondary | ICD-10-CM

## 2020-11-25 DIAGNOSIS — I480 Paroxysmal atrial fibrillation: Secondary | ICD-10-CM | POA: Diagnosis present

## 2020-11-25 DIAGNOSIS — T8131XA Disruption of external operation (surgical) wound, not elsewhere classified, initial encounter: Secondary | ICD-10-CM | POA: Diagnosis not present

## 2020-11-25 DIAGNOSIS — Z515 Encounter for palliative care: Secondary | ICD-10-CM | POA: Diagnosis not present

## 2020-11-25 DIAGNOSIS — N189 Chronic kidney disease, unspecified: Secondary | ICD-10-CM | POA: Diagnosis not present

## 2020-11-25 DIAGNOSIS — D151 Benign neoplasm of heart: Secondary | ICD-10-CM | POA: Diagnosis present

## 2020-11-25 DIAGNOSIS — R52 Pain, unspecified: Secondary | ICD-10-CM | POA: Diagnosis not present

## 2020-11-25 DIAGNOSIS — D631 Anemia in chronic kidney disease: Secondary | ICD-10-CM | POA: Diagnosis present

## 2020-11-25 DIAGNOSIS — I10 Essential (primary) hypertension: Secondary | ICD-10-CM | POA: Diagnosis present

## 2020-11-25 DIAGNOSIS — J439 Emphysema, unspecified: Secondary | ICD-10-CM | POA: Diagnosis present

## 2020-11-25 DIAGNOSIS — L89220 Pressure ulcer of left hip, unstageable: Secondary | ICD-10-CM | POA: Diagnosis present

## 2020-11-25 DIAGNOSIS — I739 Peripheral vascular disease, unspecified: Secondary | ICD-10-CM

## 2020-11-25 DIAGNOSIS — L89310 Pressure ulcer of right buttock, unstageable: Secondary | ICD-10-CM | POA: Diagnosis present

## 2020-11-25 DIAGNOSIS — Z833 Family history of diabetes mellitus: Secondary | ICD-10-CM

## 2020-11-25 DIAGNOSIS — R5381 Other malaise: Secondary | ICD-10-CM | POA: Diagnosis not present

## 2020-11-25 DIAGNOSIS — Z79899 Other long term (current) drug therapy: Secondary | ICD-10-CM

## 2020-11-25 DIAGNOSIS — Z7984 Long term (current) use of oral hypoglycemic drugs: Secondary | ICD-10-CM

## 2020-11-25 DIAGNOSIS — L89320 Pressure ulcer of left buttock, unstageable: Secondary | ICD-10-CM | POA: Diagnosis present

## 2020-11-25 DIAGNOSIS — L97529 Non-pressure chronic ulcer of other part of left foot with unspecified severity: Secondary | ICD-10-CM | POA: Diagnosis present

## 2020-11-25 DIAGNOSIS — Z951 Presence of aortocoronary bypass graft: Secondary | ICD-10-CM

## 2020-11-25 DIAGNOSIS — Y838 Other surgical procedures as the cause of abnormal reaction of the patient, or of later complication, without mention of misadventure at the time of the procedure: Secondary | ICD-10-CM | POA: Diagnosis not present

## 2020-11-25 DIAGNOSIS — E872 Acidosis: Secondary | ICD-10-CM | POA: Diagnosis present

## 2020-11-25 DIAGNOSIS — L89893 Pressure ulcer of other site, stage 3: Secondary | ICD-10-CM | POA: Diagnosis present

## 2020-11-25 DIAGNOSIS — I96 Gangrene, not elsewhere classified: Secondary | ICD-10-CM | POA: Diagnosis present

## 2020-11-25 DIAGNOSIS — Z20822 Contact with and (suspected) exposure to covid-19: Secondary | ICD-10-CM | POA: Diagnosis present

## 2020-11-25 DIAGNOSIS — Z634 Disappearance and death of family member: Secondary | ICD-10-CM

## 2020-11-25 DIAGNOSIS — M898X9 Other specified disorders of bone, unspecified site: Secondary | ICD-10-CM | POA: Diagnosis present

## 2020-11-25 DIAGNOSIS — K219 Gastro-esophageal reflux disease without esophagitis: Secondary | ICD-10-CM | POA: Diagnosis present

## 2020-11-25 DIAGNOSIS — N2581 Secondary hyperparathyroidism of renal origin: Secondary | ICD-10-CM | POA: Diagnosis present

## 2020-11-25 DIAGNOSIS — D62 Acute posthemorrhagic anemia: Secondary | ICD-10-CM | POA: Diagnosis not present

## 2020-11-25 DIAGNOSIS — L8990 Pressure ulcer of unspecified site, unspecified stage: Secondary | ICD-10-CM | POA: Diagnosis present

## 2020-11-25 DIAGNOSIS — D696 Thrombocytopenia, unspecified: Secondary | ICD-10-CM | POA: Diagnosis present

## 2020-11-25 DIAGNOSIS — E785 Hyperlipidemia, unspecified: Secondary | ICD-10-CM | POA: Diagnosis present

## 2020-11-25 DIAGNOSIS — L97519 Non-pressure chronic ulcer of other part of right foot with unspecified severity: Secondary | ICD-10-CM | POA: Diagnosis present

## 2020-11-25 DIAGNOSIS — I700 Atherosclerosis of aorta: Secondary | ICD-10-CM | POA: Diagnosis present

## 2020-11-25 DIAGNOSIS — D638 Anemia in other chronic diseases classified elsewhere: Secondary | ICD-10-CM | POA: Diagnosis present

## 2020-11-25 DIAGNOSIS — G8929 Other chronic pain: Secondary | ICD-10-CM | POA: Diagnosis not present

## 2020-11-25 DIAGNOSIS — F32A Depression, unspecified: Secondary | ICD-10-CM | POA: Diagnosis present

## 2020-11-25 DIAGNOSIS — E1169 Type 2 diabetes mellitus with other specified complication: Principal | ICD-10-CM | POA: Diagnosis present

## 2020-11-25 DIAGNOSIS — F17210 Nicotine dependence, cigarettes, uncomplicated: Secondary | ICD-10-CM | POA: Diagnosis present

## 2020-11-25 DIAGNOSIS — E8809 Other disorders of plasma-protein metabolism, not elsewhere classified: Secondary | ICD-10-CM | POA: Diagnosis present

## 2020-11-25 DIAGNOSIS — E1152 Type 2 diabetes mellitus with diabetic peripheral angiopathy with gangrene: Secondary | ICD-10-CM | POA: Diagnosis present

## 2020-11-25 DIAGNOSIS — R6510 Systemic inflammatory response syndrome (SIRS) of non-infectious origin without acute organ dysfunction: Secondary | ICD-10-CM | POA: Diagnosis present

## 2020-11-25 DIAGNOSIS — Z83438 Family history of other disorder of lipoprotein metabolism and other lipidemia: Secondary | ICD-10-CM

## 2020-11-25 DIAGNOSIS — Z8249 Family history of ischemic heart disease and other diseases of the circulatory system: Secondary | ICD-10-CM

## 2020-11-25 DIAGNOSIS — Z794 Long term (current) use of insulin: Secondary | ICD-10-CM

## 2020-11-25 DIAGNOSIS — I120 Hypertensive chronic kidney disease with stage 5 chronic kidney disease or end stage renal disease: Secondary | ICD-10-CM | POA: Diagnosis present

## 2020-11-25 DIAGNOSIS — L97119 Non-pressure chronic ulcer of right thigh with unspecified severity: Secondary | ICD-10-CM | POA: Diagnosis present

## 2020-11-25 DIAGNOSIS — Z7901 Long term (current) use of anticoagulants: Secondary | ICD-10-CM

## 2020-11-25 DIAGNOSIS — Z880 Allergy status to penicillin: Secondary | ICD-10-CM

## 2020-11-25 LAB — CBC WITH DIFFERENTIAL/PLATELET
Abs Immature Granulocytes: 1 10*3/uL — ABNORMAL HIGH (ref 0.00–0.07)
Basophils Absolute: 0.1 10*3/uL (ref 0.0–0.1)
Basophils Relative: 0 %
Eosinophils Absolute: 0.2 10*3/uL (ref 0.0–0.5)
Eosinophils Relative: 1 %
HCT: 34 % — ABNORMAL LOW (ref 36.0–46.0)
Hemoglobin: 10.3 g/dL — ABNORMAL LOW (ref 12.0–15.0)
Immature Granulocytes: 5 %
Lymphocytes Relative: 12 %
Lymphs Abs: 2.4 10*3/uL (ref 0.7–4.0)
MCH: 32.4 pg (ref 26.0–34.0)
MCHC: 30.3 g/dL (ref 30.0–36.0)
MCV: 106.9 fL — ABNORMAL HIGH (ref 80.0–100.0)
Monocytes Absolute: 1.1 10*3/uL — ABNORMAL HIGH (ref 0.1–1.0)
Monocytes Relative: 6 %
Neutro Abs: 14.3 10*3/uL — ABNORMAL HIGH (ref 1.7–7.7)
Neutrophils Relative %: 76 %
Platelets: 374 10*3/uL (ref 150–400)
RBC: 3.18 MIL/uL — ABNORMAL LOW (ref 3.87–5.11)
RDW: 21.2 % — ABNORMAL HIGH (ref 11.5–15.5)
WBC: 19 10*3/uL — ABNORMAL HIGH (ref 4.0–10.5)
nRBC: 0.3 % — ABNORMAL HIGH (ref 0.0–0.2)

## 2020-11-25 LAB — PROTIME-INR
INR: 2.3 — ABNORMAL HIGH (ref 0.8–1.2)
Prothrombin Time: 25 seconds — ABNORMAL HIGH (ref 11.4–15.2)

## 2020-11-25 LAB — RESP PANEL BY RT-PCR (FLU A&B, COVID) ARPGX2
Influenza A by PCR: NEGATIVE
Influenza B by PCR: NEGATIVE
SARS Coronavirus 2 by RT PCR: NEGATIVE

## 2020-11-25 LAB — COMPREHENSIVE METABOLIC PANEL
ALT: 14 U/L (ref 0–44)
AST: 24 U/L (ref 15–41)
Albumin: 1.9 g/dL — ABNORMAL LOW (ref 3.5–5.0)
Alkaline Phosphatase: 209 U/L — ABNORMAL HIGH (ref 38–126)
Anion gap: 15 (ref 5–15)
BUN: 22 mg/dL (ref 8–23)
CO2: 22 mmol/L (ref 22–32)
Calcium: 8.7 mg/dL — ABNORMAL LOW (ref 8.9–10.3)
Chloride: 97 mmol/L — ABNORMAL LOW (ref 98–111)
Creatinine, Ser: 3.97 mg/dL — ABNORMAL HIGH (ref 0.44–1.00)
GFR, Estimated: 12 mL/min — ABNORMAL LOW (ref >60)
Glucose, Bld: 153 mg/dL — ABNORMAL HIGH (ref 70–99)
Potassium: 3.5 mmol/L (ref 3.5–5.1)
Sodium: 134 mmol/L — ABNORMAL LOW (ref 135–145)
Total Bilirubin: 0.6 mg/dL (ref 0.3–1.2)
Total Protein: 6.1 g/dL — ABNORMAL LOW (ref 6.5–8.1)

## 2020-11-25 LAB — I-STAT CHEM 8, ED
BUN: 26 mg/dL — ABNORMAL HIGH (ref 8–23)
Calcium, Ion: 0.98 mmol/L — ABNORMAL LOW (ref 1.15–1.40)
Chloride: 100 mmol/L (ref 98–111)
Creatinine, Ser: 3.8 mg/dL — ABNORMAL HIGH (ref 0.44–1.00)
Glucose, Bld: 112 mg/dL — ABNORMAL HIGH (ref 70–99)
HCT: 27 % — ABNORMAL LOW (ref 36.0–46.0)
Hemoglobin: 9.2 g/dL — ABNORMAL LOW (ref 12.0–15.0)
Potassium: 3.3 mmol/L — ABNORMAL LOW (ref 3.5–5.1)
Sodium: 135 mmol/L (ref 135–145)
TCO2: 23 mmol/L (ref 22–32)

## 2020-11-25 LAB — LACTIC ACID, PLASMA
Lactic Acid, Venous: 4.6 mmol/L (ref 0.5–1.9)
Lactic Acid, Venous: 3.8 mmol/L (ref 0.5–1.9)

## 2020-11-25 LAB — APTT: aPTT: 45 seconds — ABNORMAL HIGH (ref 24–36)

## 2020-11-25 LAB — CBG MONITORING, ED: Glucose-Capillary: 114 mg/dL — ABNORMAL HIGH (ref 70–99)

## 2020-11-25 MED ORDER — FERRIC CITRATE 1 GM 210 MG(FE) PO TABS
210.0000 mg | ORAL_TABLET | Freq: Two times a day (BID) | ORAL | Status: DC
  Administered 2020-11-25 – 2020-11-26 (×2): 210 mg via ORAL
  Filled 2020-11-25 (×5): qty 1

## 2020-11-25 MED ORDER — DIPHENHYDRAMINE-APAP (SLEEP) 25-500 MG PO TABS: 1.0000 | ORAL_TABLET | Freq: Every evening | ORAL | Status: DC | PRN

## 2020-11-25 MED ORDER — FAMOTIDINE 20 MG PO TABS
20.0000 mg | ORAL_TABLET | Freq: Every day | ORAL | Status: DC
  Administered 2020-11-25 – 2020-12-12 (×18): 20 mg via ORAL
  Filled 2020-11-25 (×19): qty 1

## 2020-11-25 MED ORDER — SODIUM CHLORIDE 0.9 % IV SOLN
1.0000 g | INTRAVENOUS | Status: DC
  Administered 2020-11-26 – 2020-11-28 (×3): 1 g via INTRAVENOUS
  Filled 2020-11-25 (×4): qty 1

## 2020-11-25 MED ORDER — MONTELUKAST SODIUM 10 MG PO TABS
10.0000 mg | ORAL_TABLET | Freq: Every day | ORAL | Status: DC | PRN
  Administered 2020-11-26: 10 mg via ORAL
  Filled 2020-11-25: qty 1

## 2020-11-25 MED ORDER — VITAMIN D 25 MCG (1000 UNIT) PO TABS
2000.0000 [IU] | ORAL_TABLET | Freq: Every evening | ORAL | Status: DC
  Administered 2020-11-25: 2000 [IU] via ORAL
  Filled 2020-11-25: qty 2

## 2020-11-25 MED ORDER — HYDROMORPHONE HCL 1 MG/ML IJ SOLN
0.5000 mg | INTRAMUSCULAR | Status: DC | PRN
  Administered 2020-11-25 – 2020-12-03 (×27): 1 mg via INTRAVENOUS
  Filled 2020-11-25 (×26): qty 1

## 2020-11-25 MED ORDER — INSULIN ASPART 100 UNIT/ML IJ SOLN
0.0000 [IU] | Freq: Three times a day (TID) | INTRAMUSCULAR | Status: DC
  Administered 2020-11-27 – 2020-12-04 (×3): 1 [IU] via SUBCUTANEOUS
  Administered 2020-12-05: 3 [IU] via SUBCUTANEOUS
  Administered 2020-12-08 – 2020-12-11 (×5): 1 [IU] via SUBCUTANEOUS
  Administered 2020-12-12: 2 [IU] via SUBCUTANEOUS
  Administered 2020-12-13: 1 [IU] via SUBCUTANEOUS

## 2020-11-25 MED ORDER — ATORVASTATIN CALCIUM 10 MG PO TABS
20.0000 mg | ORAL_TABLET | Freq: Every day | ORAL | Status: DC
  Administered 2020-11-25 – 2020-12-13 (×19): 20 mg via ORAL
  Filled 2020-11-25 (×19): qty 2

## 2020-11-25 MED ORDER — DOCUSATE SODIUM 100 MG PO CAPS
100.0000 mg | ORAL_CAPSULE | Freq: Every day | ORAL | Status: DC | PRN
  Administered 2020-12-01: 100 mg via ORAL
  Filled 2020-11-25: qty 1

## 2020-11-25 MED ORDER — ASPIRIN EC 81 MG PO TBEC
81.0000 mg | DELAYED_RELEASE_TABLET | Freq: Every day | ORAL | Status: DC
  Administered 2020-11-26 – 2020-12-13 (×17): 81 mg via ORAL
  Filled 2020-11-25 (×17): qty 1

## 2020-11-25 MED ORDER — AMIODARONE HCL 200 MG PO TABS
200.0000 mg | ORAL_TABLET | Freq: Every day | ORAL | Status: DC
  Administered 2020-11-25 – 2020-12-13 (×19): 200 mg via ORAL
  Filled 2020-11-25 (×19): qty 1

## 2020-11-25 MED ORDER — OXYCODONE HCL 5 MG PO TABS
5.0000 mg | ORAL_TABLET | Freq: Four times a day (QID) | ORAL | Status: DC | PRN
Start: 2020-11-25 — End: 2020-12-04
  Administered 2020-11-26 – 2020-12-03 (×16): 5 mg via ORAL
  Filled 2020-11-25 (×16): qty 1

## 2020-11-25 MED ORDER — COLLAGENASE 250 UNIT/GM EX OINT: 1.0000 "application " | TOPICAL_OINTMENT | CUTANEOUS | Status: DC

## 2020-11-25 MED ORDER — VANCOMYCIN HCL 1250 MG/250ML IV SOLN
1250.0000 mg | Freq: Once | INTRAVENOUS | Status: AC
  Administered 2020-11-25: 1250 mg via INTRAVENOUS
  Filled 2020-11-25: qty 250

## 2020-11-25 MED ORDER — CLINDAMYCIN PHOSPHATE 600 MG/50ML IV SOLN
600.0000 mg | Freq: Once | INTRAVENOUS | Status: AC
Start: 2020-11-25 — End: 2020-11-25
  Administered 2020-11-25: 600 mg via INTRAVENOUS
  Filled 2020-11-25: qty 50

## 2020-11-25 MED ORDER — FLUTICASONE PROPIONATE 50 MCG/ACT NA SUSP
2.0000 | NASAL | Status: DC | PRN
  Filled 2020-11-25 (×2): qty 16

## 2020-11-25 MED ORDER — HEPARIN (PORCINE) 25000 UT/250ML-% IV SOLN
1100.0000 [IU]/h | INTRAVENOUS | Status: DC
  Administered 2020-11-25: 750 [IU]/h via INTRAVENOUS
  Administered 2020-11-27 – 2020-11-29 (×2): 1000 [IU]/h via INTRAVENOUS
  Administered 2020-11-30: 1100 [IU]/h via INTRAVENOUS
  Filled 2020-11-25 (×4): qty 250

## 2020-11-25 MED ORDER — FOLIC ACID 1 MG PO TABS
1.0000 mg | ORAL_TABLET | Freq: Every day | ORAL | Status: DC
  Administered 2020-11-26 – 2020-12-13 (×17): 1 mg via ORAL
  Filled 2020-11-25 (×17): qty 1

## 2020-11-25 MED ORDER — FOLIC ACID 1 MG PO TABS: 0.5000 mg | ORAL_TABLET | Freq: Every day | ORAL | Status: DC

## 2020-11-25 MED ORDER — DIPHENHYDRAMINE HCL 25 MG PO CAPS
25.0000 mg | ORAL_CAPSULE | Freq: Every evening | ORAL | Status: DC | PRN
  Administered 2020-11-28 – 2020-11-29 (×2): 25 mg via ORAL
  Filled 2020-11-25 (×4): qty 1

## 2020-11-25 MED ORDER — SODIUM CHLORIDE 0.9 % IV SOLN
2.0000 g | Freq: Once | INTRAVENOUS | Status: AC
  Administered 2020-11-25: 2 g via INTRAVENOUS
  Filled 2020-11-25: qty 2

## 2020-11-25 MED ORDER — ACETAMINOPHEN 325 MG PO TABS
650.0000 mg | ORAL_TABLET | Freq: Three times a day (TID) | ORAL | Status: DC | PRN
  Administered 2020-11-29 – 2020-12-06 (×3): 650 mg via ORAL
  Filled 2020-11-25 (×4): qty 2

## 2020-11-25 MED ORDER — VANCOMYCIN HCL 750 MG/150ML IV SOLN
750.0000 mg | INTRAVENOUS | Status: DC
  Administered 2020-11-26: 750 mg via INTRAVENOUS
  Filled 2020-11-25 (×2): qty 150

## 2020-11-25 MED ORDER — ASCORBIC ACID 500 MG PO TABS
1000.0000 mg | ORAL_TABLET | Freq: Every evening | ORAL | Status: DC
  Administered 2020-11-25 – 2020-11-26 (×2): 1000 mg via ORAL
  Filled 2020-11-25 (×2): qty 2

## 2020-11-25 MED ORDER — ACETAMINOPHEN 500 MG PO TABS: 500.0000 mg | ORAL_TABLET | Freq: Every evening | ORAL | Status: DC | PRN

## 2020-11-25 MED ORDER — DICLOFENAC SODIUM 1 % EX GEL
1.0000 "application " | Freq: Four times a day (QID) | CUTANEOUS | Status: DC | PRN
  Filled 2020-11-25 (×2): qty 100

## 2020-11-25 MED ORDER — LINAGLIPTIN 5 MG PO TABS
5.0000 mg | ORAL_TABLET | Freq: Every day | ORAL | Status: DC
  Administered 2020-11-26 – 2020-12-13 (×17): 5 mg via ORAL
  Filled 2020-11-25 (×17): qty 1

## 2020-11-25 MED ORDER — BISACODYL 5 MG PO TBEC
5.0000 mg | DELAYED_RELEASE_TABLET | Freq: Every day | ORAL | Status: DC | PRN
  Administered 2020-11-28 – 2020-12-01 (×2): 5 mg via ORAL
  Filled 2020-11-25 (×2): qty 1

## 2020-11-25 NOTE — ED Provider Notes (Signed)
[facility] EMERGENCY DEPARTMENT Provider Note   CSN: [id number] Arrival date & time: 11/25/20  1247     History Chief Complaint  Patient presents with   Wound Infection    Kathryn Hancock is a 70 y.o. female.  The history is provided by the patient and medical records.  Kathryn Hancock is a 70 y.o. female who presents to the Emergency Department complaining of wounds. She presents the emergency department from vascular surgery clinic for evaluation of progressive wounds. She states that she had bypass surgery at [facility] performed in December of this year. Since the surgery she has experienced pain and bilateral lower extremities from the waist down. She states that overall her pain is worsening. She also has progressive wounds to bilateral toes as well as her right thigh. These have been experiencing increased drainage lately. Home health nursing requested that she see her vascular surgeon. She was evaluated in the office today with referral to the emergency department. She denies any fevers, chest pain, shortness of breath, nausea, vomiting. She is not currently on antibiotics. She dialysis Tuesday, Thursday, Saturday in [place].    Past Medical History:  Diagnosis Date   AAA (abdominal aortic aneurysm) ([place]) 06/2019   Multiple small pseudoaneurysmal projections of the dominant aorta.  Distal abdominal aortic aneurysm 4.5 x 4.7 cm.  Greatest AP dimension of the infrarenal aorta is 4.9.  No evidence of thoracic aortic aneurysm or dissection.  Brief segment of proximal IMA occlusion.   Anemia    Coronary artery disease involving native coronary artery of native heart without angina pectoris 06/01/2020   Cardiac cath at [facility] 06/01/2020-preop for atrial myxoma resection -> severe proximal RCA and PDA -> had single-vessel CABG with SVG-PDA along with myxoma resection.   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 = Status post resection December 23,2021 (done at [facility] because of no bed availability at [facility]   Microscopic hematuria    PAF (paroxysmal atrial fibrillation) ([place]) 07/01/2020   Initially noted postoperatively-left atrial myxoma resection and CABG x1.  Now on amiodarone and apixaban..   Peripheral vascular disease ([place])    Bilateral SFA & ATA occlusion.  2 V runoffvia Peroneal A & PTA. --> s/p L Fem-Pop (AK pop A) bypass (PFTE) 09/12/2020 - pending R Fem-AKPop bypass   Plantar wart of right foot    PONV (postoperative nausea and vomiting)     Patient Active Problem List   Diagnosis Date Noted   Pressure injury of skin 11/01/2020   Iron deficiency anemia, unspecified 10/22/2020   Malnutrition of moderate degree 09/30/2020   Current use of long term anticoagulation    Weakness    Anemia 09/29/2020   Personal history of COVID-19 09/28/2020  COVID-19 08/22/2020   Hyperlipidemia associated with type 2 diabetes mellitus ([place]) 08/05/2020   Hypokalemia 07/27/2020   Unspecified abnormal findings in urine 07/26/2020   Unspecified severe protein-calorie malnutrition ([place]) 07/21/2020   Encounter for immunization 07/19/2020   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[place]) 07/16/2020   Coagulation defect, unspecified ([place]) 07/16/2020    Allergy, unspecified, initial encounter 07/15/2020   Anaphylactic shock, unspecified, initial encounter 07/15/2020   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   Pain, unspecified 07/15/2020   Pruritus, unspecified 07/15/2020   Secondary hyperparathyroidism of renal origin ([place]) 07/15/2020   Shortness of breath 07/15/2020   PAOD (peripheral arterial occlusive disease) ([place]) 07/08/2020   ESRD on dialysis ([place]) 07/01/2020   PAF (paroxysmal atrial fibrillation) (HCC) -  CHA2DS2-VASc Score 5 (Age, Female, HTN, DM, Vascular)- On Eliquis 07/01/2020   Anemia of chronic disease 07/01/2020   Essential hypertension 07/01/2020   DM (diabetes mellitus), type 2 with renal complications ([place]) 01/22/4817   Cellulitis of toe 06/05/2020   S/P CABG (coronary artery bypass graft) 06/02/2020   Coronary artery disease involving native coronary artery of native heart without angina pectoris 06/01/2020   CKD (chronic kidney disease) stage 3, GFR 30-59 ml/min (HCC) 06/01/2020   HLD (hyperlipidemia) 06/01/2020   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 Known Para-Ren  Aneurysm. Bilat Com, Internal & External Iliacs - CFA& DFA patent.  Bilat SFA 100% @ origin - recon @ AK Pop A.; Bilateral 2 V runnoff - Bilateral Peroneal A & PTA patent w/ Bilat ATA CTO.   AV FISTULA PLACEMENT Left 07/11/2020   Procedure: LEFT FIRST STAGE BRACHIOBASILIC UPPER EXTREMITY ARTERIOVENOUS (AV) FISTULA CREATION;  Surgeon: [name], MD;  Location: [place];  Service: Vascular;  Laterality: Left;   BASCILIC VEIN TRANSPOSITION Left 10/03/2020    Procedure: BASILIC VEIN TRANSPOSITION SECOND STAGE LEFT;  Surgeon: Angelia Mould, MD;  Location: [place];  Service: Vascular;  Laterality: Left;   BIOPSY  10/01/2020   Procedure: BIOPSY;  Surgeon: Jackquline Denmark, MD;  Location: [facility] ENDOSCOPY;  Service: Endoscopy;;   Cardiac MRI  01/22/2020   Normal LV size and function.  Moderate focal basal septal hypertrophy.  No S.A.M.  LVEF 61%.  RVEF 66%.  Large mobile mass in the left atrium attached to the interatrial septum-does not appear to thrombus characteristics consistent with left atrial myxoma.   Chest CTA  06/2019   Multiple small pseudoaneurysmal projections of the dominant aorta.  Distal abdominal aortic aneurysm 4.5 x 4.7 cm.  Greatest AP dimension of the infrarenal aorta is 4.9.  No evidence of thoracic aortic aneurysm or dissection.  Brief segment of proximal IMA occlusion.  Large geographic filling defect of the left atrium with associated dense radiopaque material.  Aortic atherosclerosis and emphysema   COLONOSCOPY W/ POLYPECTOMY     CORONARY ARTERY BYPASS GRAFT  06/02/2020   @ [facility] - Dr. Norm Parcel; SVG-rPDA along with Atrial Myxoma Resection.   ESOPHAGOGASTRODUODENOSCOPY (EGD) WITH PROPOFOL N/A 10/01/2020   Procedure: ESOPHAGOGASTRODUODENOSCOPY (EGD) WITH PROPOFOL;  Surgeon: Jackquline Denmark, MD;  Location: [facility] ENDOSCOPY;  Service: Endoscopy;  Laterality: N/A;   EYE SURGERY  Bilateral    Cataract surgery   FEMORAL-POPLITEAL BYPASS GRAFT Left 09/12/2020   Procedure: LEFT FEMORAL-ABOVE KNEE POPLITEAL ARTERY BYPASS GRAFT;  Surgeon: Angelia Mould, MD;  Location: [place];  Service: Vascular;  Laterality: Left;   FEMORAL-POPLITEAL BYPASS GRAFT Right 10/31/2020   Procedure: RIGHT BYPASS GRAFT FEMORAL- ABOVE KNEE POPLITEAL ARTERY;  Surgeon: Angelia Mould, MD;  Location: [place];  Service: Vascular;  Laterality: Right;   HOT HEMOSTASIS N/A 10/01/2020   Procedure: HOT HEMOSTASIS (ARGON PLASMA COAGULATION/BICAP);  Surgeon: Jackquline Denmark,  MD;  Location: [facility] ENDOSCOPY;  Service: Endoscopy;  Laterality: N/A;   INTRAOPERATIVE TRANSESOPHAGEAL ECHOCARDIOGRAM  06/02/2020   [facility] (LA Myxoma Resection & CABG x 1): Pre-op EF> 55%. Normal WM. Mild AI. Large LA mass with calcifications &minimal vascularity (5.92 x 4 cm @ largest. LA dilation. Diffuse atherosclerosis of descending Ao, focal calcification of Sinus of  Valsalva. -> Post-op. S/p Mass Excision - no residual mass in LA. No ASD or PFO.  EF >55% no WMA.   IR FLUORO GUIDE CV LINE RIGHT  07/06/2020   IR US GUIDE VASC ACCESS RIGHT  07/06/2020   LEFT ATRIAL MYXOMA RESECTION Left 06/02/2020   [facility]: Dr. Norm Parcel   LEFT HEART CATH AND CORONARY ANGIOGRAPHY  05/2020   [facility]: Biplane Coronary Angiography, Significant Prox RCA & rPDA disease. Normal CO/CI with elevated LVEDP.   TRANSESOPHAGEAL ECHOCARDIOGRAM  05/31/2020   [facility]:  Normal LV function.  No LAA thrombus.  Large mobile left atrial mass attached to the left atrial septum (4.8 x 3.1 cm with calcification)-most consistent with atrial myxoma.  EF estimated 55%.  Normal valves.   TRANSTHORACIC ECHOCARDIOGRAM  07/2019    EF 60 to 65%.  GR 1 DD.  Elevated LVEDP.  Large size ill-defined echodensity in the left atrium suspicious for thrombus versus tumor.  Moderately dilated LA.   TRANSTHORACIC ECHOCARDIOGRAM  08/31/2020   (Postop left atrial myxoma resection) normal LVEF of 55-60%.  No R WMA.  GR 1 DD.  Mildly reduced RV function with normal PAP.  Trivial AI.  Left atrial myxoma no longer present.  Mitral valve stable trivial MR.   TUBAL LIGATION       OB History   No obstetric history on file.     Family History  Problem Relation Age of Onset   Diabetes Mother    Diabetes Father    Heart attack Father 24   CAD Father    Hyperlipidemia Father    Hypertension Father     Social History   Tobacco Use   Smoking status: Every Day    Packs/day: 0.50    Years: 50.00    Pack years: 25.00    Types: Cigarettes   Smokeless tobacco:  Never  Vaping Use   Vaping Use: Never used  Substance Use Topics   Alcohol use: Not Currently   Drug use: Never    Home Medications Prior to Admission medications   Medication Sig Start Date End Date Taking? Authorizing Provider  acetaminophen (TYLENOL) 650 MG CR tablet Take 1,300 mg by mouth every 8 (eight) hours as needed for pain.    [provider]  amiodarone (PACERONE) 200 MG tablet Take 200 mg by mouth daily. 06/10/20 06/10/21  [provider]  amLODipine (NORVASC) 5 MG tablet Take 5 mg by mouth daily. 06/10/20 06/10/21  [provider]  apixaban (ELIQUIS) 5 MG TABS tablet Take 1 tablet (5 mg total) by mouth 2 (two) times daily. 12/10/19   Ellyn Hack,  Leonie Green, MD  Ascorbic Acid (VITAMIN C) 1000 MG tablet Take 1,000 mg by mouth every evening.    [provider]  aspirin EC 81 MG tablet Take 81 mg by mouth daily. Swallow whole.    [provider]  atorvastatin (LIPITOR) 20 MG tablet Take 20 mg by mouth daily.    [provider]  AURYXIA 1 GM 210 MG(Fe) tablet Take 210 mg by mouth 2 (two) times daily. 10/25/20   [provider]  carvedilol (COREG) 6.25 MG tablet TAKE 1 TABLET (6.25 MG TOTAL) BY MOUTH TWO TIMES DAILY WITH A MEAL. Patient taking differently: Take 6.25 mg by mouth 2 (two) times daily with a meal. 07/15/20 07/15/21  Darliss Cheney, MD  collagenase (SANTYL) ointment Apply 1 application topically every 3 (three) days. And cover with foam padded bandage. Change every 3 days or sooner if soiled. 10/03/20   Ezequiel Essex, MD  diclofenac Sodium (VOLTAREN) 1 % GEL Apply 1 application topically 4 (four) times daily as needed (pain).    [provider]  diphenhydramine-acetaminophen (TYLENOL PM) 25-500 MG TABS tablet Take 1 tablet by mouth at bedtime as needed (sleep).    [provider]  docusate sodium (COLACE) 100 MG capsule Take 100 mg by mouth daily as needed for mild constipation.    [provider]   famotidine (PEPCID) 20 MG tablet Take 20 mg by mouth daily.    [provider]  fluticasone (FLONASE) 50 MCG/ACT nasal spray Place 2 sprays into both nostrils as needed for allergies or rhinitis.    [provider]  folic acid (FOLVITE) 478 MCG tablet Take 800 mcg by mouth daily.    [provider]  glucose blood test strip 1 each by Other route as needed. Use as instructed    [provider]  lidocaine-prilocaine (EMLA) cream Apply 1 application topically daily as needed (dialysis). 08/12/20   [provider]  linagliptin (TRADJENTA) 5 MG TABS tablet Take 5 mg by mouth daily.    [provider]  loperamide (IMODIUM A-D) 2 MG tablet Take 2 mg by mouth 4 (four) times daily as needed for diarrhea or loose stools.    [provider]  Methoxy PEG-Epoetin Beta (MIRCERA IJ) Mircera 09/20/20   [provider]  montelukast (SINGULAIR) 10 MG tablet Take 10 mg by mouth daily as needed (allergies).    [provider]  neomycin-bacitracin-polymyxin (NEOSPORIN) ointment Apply 1 application topically as needed for wound care.    [provider]  oxyCODONE (OXY IR/ROXICODONE) 5 MG immediate release tablet Take 1 tablet (5 mg total) by mouth every 6 (six) hours as needed for moderate pain. 11/02/20   Dagoberto Ligas, PA-C  Vitamin D3 (VITAMIN D) 25 MCG tablet Take 2,000 Units by mouth every evening.    [provider]    Allergies    Penicillins  Review of Systems   Review of Systems  All other systems reviewed and are negative.  Physical Exam Updated Vital Signs BP (!) 116/58   Pulse 73   Temp (!) 97 F (36.1 C) (Temporal)   Resp 13   Ht 5\' 3"  (1.6 m)   Wt 62.1 kg   SpO2 99%   BMI 24.27 kg/m   Physical Exam Vitals and nursing note reviewed.  Constitutional:      General: She is in acute distress.     Appearance: She is well-developed. She is ill-appearing.  HENT:     Head: Normocephalic and  atraumatic.  Cardiovascular:     Rate and Rhythm: Normal rate and regular rhythm.     Heart sounds: No murmur heard. Pulmonary:     Effort: Pulmonary effort is normal. No respiratory distress.     Breath sounds: Normal breath sounds.     Comments: Vas cath in right upper chest wall Abdominal:     Palpations: Abdomen is soft.     Tenderness: There is no abdominal tenderness. There is no guarding or rebound.  Musculoskeletal:        General: No tenderness.     Comments: 2 to 3+ pitting edema to bilateral lower extremities. There are necrosis to the toes bilaterally. There also necrotic patches to bilateral hips. There is a wound to the right medial thigh with surrounding erythema. There is watery drainage from the distal aspect of this wound. The proximal aspect of the wound is deep with a small amount of necrotic tissue at the edges.  Skin:    General: Skin is warm and dry.     Coloration: Skin is pale.  Neurological:     Mental Status: She is alert and oriented to person, place, and time.  Psychiatric:        Behavior: Behavior normal.             ED Results / Procedures / Treatments   Labs (all labs ordered are listed, but only abnormal results are displayed) Labs Reviewed  LACTIC ACID, PLASMA - Abnormal; Notable for the following components:      Result Value   Lactic Acid, Venous 4.6 (*)    All other components within normal limits  COMPREHENSIVE METABOLIC PANEL - Abnormal; Notable for the following components:   Sodium 134 (*)    Chloride 97 (*)    Glucose, Bld 153 (*)    Creatinine, Ser 3.97 (*)    Calcium 8.7 (*)    Total Protein 6.1 (*)    Albumin 1.9 (*)    Alkaline Phosphatase 209 (*)    GFR, Estimated 12 (*)    All other components within normal limits  CBC WITH DIFFERENTIAL/PLATELET - Abnormal; Notable for the following components:   WBC 19.0 (*)    RBC 3.18 (*)    Hemoglobin 10.3 (*)    HCT 34.0 (*)    MCV 106.9 (*)    RDW 21.2 (*)    nRBC 0.3 (*)     Neutro Abs 14.3 (*)    Monocytes Absolute 1.1 (*)    Abs Immature Granulocytes 1.00 (*)    All other components within normal limits  PROTIME-INR - Abnormal; Notable for the following components:   Prothrombin Time 25.0 (*)    INR 2.3 (*)    All other components within normal limits  APTT - Abnormal; Notable for the following components:   aPTT 45 (*)    All other components within normal limits  I-STAT CHEM 8, ED - Abnormal; Notable for the following components:   Potassium 3.3 (*)    BUN 26 (*)    Creatinine, Ser 3.80 (*)    Glucose, Bld 112 (*)    Calcium, Ion 0.98 (*)    Hemoglobin 9.2 (*)    HCT 27.0 (*)    All other components within normal limits  URINE CULTURE  CULTURE, BLOOD (ROUTINE X 2)  CULTURE, BLOOD (ROUTINE X 2)  RESP PANEL BY RT-PCR (FLU A&B, COVID) [id number]  LACTIC ACID, PLASMA  URINALYSIS, ROUTINE W REFLEX MICROSCOPIC    EKG EKG  Interpretation  Date/Time:  Friday November 25 2020 13:58:08 EDT Ventricular Rate:  70 PR Interval:  186 QRS Duration: 103 QT Interval:  460 QTC Calculation: 497 R Axis:   74 Text Interpretation: Sinus rhythm Borderline T abnormalities, anterior leads Borderline prolonged QT interval Confirmed by Quintella Reichert [phone]) on 11/25/2020 2:37:52 PM  Radiology DG Chest Port 1 View  Result Date: 11/25/2020 CLINICAL DATA:  Questionable sepsis, evaluate for abnormality. EXAM: PORTABLE CHEST 1 VIEW COMPARISON:  Chest radiographs 09/29/2020 and earlier. FINDINGS: Unchanged position of a right-sided dialysis catheter with tip projecting at the level of the superior cavoatrial junction/upper right atrium. Prior median sternotomy/CABG. Heart size within normal limits. Aortic atherosclerosis. No appreciable airspace consolidation or pulmonary edema. No evidence of pleural effusion or pneumothorax. No acute bony abnormality identified. IMPRESSION: No evidence of acute cardiopulmonary abnormality. Aortic Atherosclerosis (ICD10-I70.0). Electronically  Signed   By: Kellie Simmering DO   On: 11/25/2020 15:01    Procedures Procedures   Medications Ordered in ED Medications  clindamycin (CLEOCIN) IVPB 600 mg (600 mg Intravenous New Bag/Given 11/25/20 1527)    ED Course  I have reviewed the triage vital signs and the nursing notes.  Pertinent labs & imaging results that were available during my care of the patient were reviewed by me and considered in my medical decision making (see chart for details).    MDM Rules/Calculators/A&P                         patient with ESRD on hemodialysis here for evaluation of progressive poorly healing wounds. She does have increased drainage from the wound on her right leg as well as from her toes. Right thigh is concerning for cellulitis. She was started on antibiotics.  Medicine consulted for admission for ongoing treatment.  Final Clinical Impression(s) / ED Diagnoses Final diagnoses:  Cellulitis of right lower extremity  ESRD (end stage renal disease) on dialysis [facility])    Rx / [place] Orders ED Discharge Orders     None        Quintella Reichert, MD 11/25/20 1556

## 2020-11-25 NOTE — Telephone Encounter (Signed)
Home health nurse called to report bilateral feet weeping, with maceration of the skin. She also reports copious amounts green drainage coming from thigh wound with a grey wound bed. Added on to Dr. Jamse Mead schedule for today.

## 2020-11-25 NOTE — ED Triage Notes (Signed)
Patient presents to ED via POV , states she had heart surgery in Dec. At [facility], states she started dialysis in Jan and had some bleeding in her stomach in Feb. States she had fem pop bypass to both legs. States her home health nurse came to check her wounds today and didn't like the way her wound looked on her right upper inner thigh. , states she went to her vascular surgeon  today and was told to come to the ED for further eval. All toes on both feet are black and hard. Bilateral pedal edema, positive bilateral pedal edema.

## 2020-11-25 NOTE — H&P (Addendum)
History and Physical    Kathryn Hancock QVZ:[id number] DOB: 20-Aug-1950 DOA: 11/25/2020  PCP: Gala Lewandowsky, MD (Confirm with patient/family/NH records and if not entered, this has to be entered at [facility] point of entry) Patient coming from: Home  I have personally briefly reviewed patient's old medical records in [place]  Chief Complaint: Worsening of toe gangrene.  HPI: Kathryn Hancock is a 70 y.o. female with medical history significant of PAD with status post left and right leg bypasses, chronic bilateral toes ischemia, unhealing wound of right thigh after right femoropopliteal bypass, ESRD on HD TTS, PAF on Eliquis, IDDM, HTN, CAD status post CABG, HTN, HLD, presented with worsening of gangrene-like changes on bilateral toes and on healing wound of right thigh, and unhealing ulcers of bilateral hips.  Patient has had bilateral toe ulcers since March this year due to bilateral embolization event, and gradually, change into gangrene-like changes over the last 79-month, patient underwent a right femoropopliteal in May 2022.  However, surgical wound on the right thigh never healed and then developed a unhealing ulcer since, for which the patient family has been doing most of the wound care while waiting for wound care to set up.  In addition, patient also has a chronic bilateral hip area wound, was a shallow ulcer then as per instruction from vascular surgery patient family has been applying iodine to the wound, but similarly both wound also became larger and deeper.  And patient complained severe pain associated with bilateral hip wounds especially during dialysis.  Family also noticed increasing swelling of bilateral lower extremities.  Patient denies any shortness of breath chest pains cough and denied any fever chills.  Today patient went to see vascular surgeon for postop follow-up, and vascular surgery concerned about wound infection and nonhealing wound and sent patient to ED. ED Course:  Patient was given 1 dose of clindamycin.  Review of Systems: As per HPI otherwise 14 point review of systems negative.   Past Medical History:  Diagnosis Date   AAA (abdominal aortic aneurysm) ([place]) 06/2019   Multiple small pseudoaneurysmal projections of the dominant aorta.  Distal abdominal aortic aneurysm 4.5 x 4.7 cm.  Greatest AP dimension of the infrarenal aorta is 4.9.  No evidence of thoracic aortic aneurysm or dissection.  Brief segment of proximal IMA occlusion.   Anemia    Coronary artery disease involving native coronary artery of native heart without angina pectoris 06/01/2020   Cardiac cath at [facility] 06/01/2020-preop for atrial myxoma resection -> severe proximal RCA and PDA -> had single-vessel CABG with SVG-PDA along with myxoma resection.   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 05/27/2020   ESRD (end stage renal disease) on dialysis ([place]) 06/2020   Progression of CKDIIIb to ESRD initially related to thromboembolic event from left atrial myxoma; complicated by perioperative hypotension-; now 1 on HD TU/TH/SAT @ [place]   GERD (gastroesophageal reflux disease)    Heavy smoker (more than 20 cigarettes per day)    ~ 2 ppd; since age 66 (61 pk yr) = has cut down to one half PPD.>   Hyperlipidemia associated with type 2 diabetes mellitus ([place]) 08/05/2020   Hypertension    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 = Status post resection December 23,2021 (done at [facility] because of no bed availability at  [place]   Microscopic hematuria    PAF (paroxysmal atrial fibrillation) ([place]) 07/01/2020   Initially noted postoperatively-left atrial myxoma resection and CABG x1.  Now on amiodarone and apixaban..   Peripheral vascular disease ([place])    Bilateral SFA & ATA occlusion.  2  V runoffvia Peroneal A & PTA. --> s/p L Fem-Pop (AK pop A) bypass (PFTE) 09/12/2020 - pending R Fem-AKPop bypass   Plantar wart of right foot    PONV (postoperative nausea and vomiting)     Past Surgical History:  Procedure Laterality Date   ABDOMINAL AORTOGRAM W/LOWER EXTREMITY N/A 07/08/2020   Procedure: ABDOMINAL AORTOGRAM W/LOWER EXTREMITY;  Surgeon: Angelia Mould, MD;  Location: [place] CV LAB:  2 R Renal & 1 L Renal A. Known Para-Ren  Aneurysm. Bilat Com, Internal & External Iliacs - CFA& DFA patent.  Bilat SFA 100% @ origin - recon @ AK Pop A.; Bilateral 2 V runnoff - Bilateral Peroneal A & PTA patent w/ Bilat ATA CTO.   AV FISTULA PLACEMENT Left 07/11/2020   Procedure: LEFT FIRST STAGE BRACHIOBASILIC UPPER EXTREMITY ARTERIOVENOUS (AV) FISTULA CREATION;  Surgeon: [name], MD;  Location: [place];  Service: Vascular;  Laterality: Left;   BASCILIC VEIN TRANSPOSITION Left 10/03/2020   Procedure: BASILIC VEIN TRANSPOSITION SECOND STAGE LEFT;  Surgeon: Angelia Mould, MD;  Location: [place];  Service: Vascular;  Laterality: Left;   BIOPSY  10/01/2020   Procedure: BIOPSY;  Surgeon: Jackquline Denmark, MD;  Location: [facility] ENDOSCOPY;  Service: Endoscopy;;   Cardiac MRI  01/22/2020   Normal LV size and function.  Moderate focal basal septal hypertrophy.  No S.A.M.  LVEF 61%.  RVEF 66%.  Large mobile mass in the left atrium attached to the interatrial septum-does not appear to thrombus characteristics consistent with left atrial myxoma.   Chest CTA  06/2019   Multiple small pseudoaneurysmal projections of the dominant aorta.  Distal abdominal aortic aneurysm 4.5 x 4.7 cm.  Greatest AP dimension of the infrarenal aorta is 4.9.  No evidence of thoracic aortic aneurysm or dissection.  Brief segment of proximal IMA occlusion.  Large geographic filling defect of the left atrium with associated dense radiopaque material.  Aortic atherosclerosis and emphysema   COLONOSCOPY W/ POLYPECTOMY      CORONARY ARTERY BYPASS GRAFT  06/02/2020   @ [facility] - Dr. Norm Parcel; SVG-rPDA along with Atrial Myxoma Resection.   ESOPHAGOGASTRODUODENOSCOPY (EGD) WITH PROPOFOL N/A 10/01/2020   Procedure: ESOPHAGOGASTRODUODENOSCOPY (EGD) WITH PROPOFOL;  Surgeon: Jackquline Denmark, MD;  Location: [facility] ENDOSCOPY;  Service: Endoscopy;  Laterality: N/A;   EYE SURGERY Bilateral    Cataract surgery   FEMORAL-POPLITEAL BYPASS GRAFT Left 09/12/2020   Procedure: LEFT FEMORAL-ABOVE KNEE POPLITEAL ARTERY BYPASS GRAFT;  Surgeon: Angelia Mould, MD;  Location: [place];  Service: Vascular;  Laterality: Left;   FEMORAL-POPLITEAL BYPASS GRAFT Right 10/31/2020   Procedure: RIGHT BYPASS GRAFT FEMORAL- ABOVE KNEE POPLITEAL ARTERY;  Surgeon: Angelia Mould, MD;  Location: [place];  Service: Vascular;  Laterality: Right;   HOT HEMOSTASIS N/A 10/01/2020   Procedure: HOT HEMOSTASIS (ARGON PLASMA COAGULATION/BICAP);  Surgeon: Jackquline Denmark, MD;  Location: [facility] ENDOSCOPY;  Service: Endoscopy;  Laterality: N/A;   INTRAOPERATIVE TRANSESOPHAGEAL ECHOCARDIOGRAM  06/02/2020   [facility] (LA Myxoma Resection & CABG x 1): Pre-op EF> 55%. Normal WM. Mild AI. Large LA mass with calcifications &minimal vascularity (5.92 x 4 cm @ largest. LA dilation. Diffuse atherosclerosis of descending Ao, focal calcification of Sinus of  Valsalva. ->  Post-op. S/p Mass Excision - no residual mass in LA. No ASD or PFO.  EF >55% no WMA.   IR FLUORO GUIDE CV LINE RIGHT  07/06/2020   IR US GUIDE VASC ACCESS RIGHT  07/06/2020   LEFT ATRIAL MYXOMA RESECTION Left 06/02/2020   [facility]: Dr. Norm Parcel   LEFT HEART CATH AND CORONARY ANGIOGRAPHY  05/2020   [facility]: Biplane Coronary Angiography, Significant Prox RCA & rPDA disease. Normal CO/CI with elevated LVEDP.   TRANSESOPHAGEAL ECHOCARDIOGRAM  05/31/2020   [facility]:  Normal LV function.  No LAA thrombus.  Large mobile left atrial mass attached to the left atrial septum (4.8 x 3.1 cm with calcification)-most consistent with atrial  myxoma.  EF estimated 55%.  Normal valves.   TRANSTHORACIC ECHOCARDIOGRAM  07/2019    EF 60 to 65%.  GR 1 DD.  Elevated LVEDP.  Large size ill-defined echodensity in the left atrium suspicious for thrombus versus tumor.  Moderately dilated LA.   TRANSTHORACIC ECHOCARDIOGRAM  08/31/2020   (Postop left atrial myxoma resection) normal LVEF of 55-60%.  No R WMA.  GR 1 DD.  Mildly reduced RV function with normal PAP.  Trivial AI.  Left atrial myxoma no longer present.  Mitral valve stable trivial MR.   TUBAL LIGATION       reports that she has been smoking cigarettes. She has a 25.00 pack-year smoking history. She has never used smokeless tobacco. She reports previous alcohol use. She reports that she does not use drugs.  Allergies  Allergen Reactions   Penicillins Anaphylaxis    Anaphylaxis as a child    Family History  Problem Relation Age of Onset   Diabetes Mother    Diabetes Father    Heart attack Father 45   CAD Father    Hyperlipidemia Father    Hypertension Father      Prior to Admission medications   Medication Sig Start Date End Date Taking? Authorizing Provider  acetaminophen (TYLENOL) 650 MG CR tablet Take 1,300 mg by mouth every 8 (eight) hours as needed for pain.    [provider]  amiodarone (PACERONE) 200 MG tablet Take 200 mg by mouth daily. 06/10/20 06/10/21  [provider]  amLODipine (NORVASC) 5 MG tablet Take 5 mg by mouth daily. 06/10/20 06/10/21  [provider]  apixaban (ELIQUIS) 5 MG TABS tablet Take 1 tablet (5 mg total) by mouth 2 (two) times daily. 12/10/19   Leonie Man, MD  Ascorbic Acid (VITAMIN C) 1000 MG tablet Take 1,000 mg by mouth every evening.    [provider]  aspirin EC 81 MG tablet Take 81 mg by mouth daily. Swallow whole.    [provider]  atorvastatin (LIPITOR) 20 MG tablet Take 20 mg by mouth daily.    [provider]  AURYXIA 1 GM 210 MG(Fe) tablet Take 210 mg by mouth 2 (two)  times daily. 10/25/20   [provider]  carvedilol (COREG) 6.25 MG tablet TAKE 1 TABLET (6.25 MG TOTAL) BY MOUTH TWO TIMES DAILY WITH A MEAL. Patient taking differently: Take 6.25 mg by mouth 2 (two) times daily with a meal. 07/15/20 07/15/21  Darliss Cheney, MD  collagenase (SANTYL) ointment Apply 1 application topically every 3 (three) days. And cover with foam padded bandage. Change every 3 days or sooner if soiled. 10/03/20   Ezequiel Essex, MD  diclofenac Sodium (VOLTAREN) 1 % GEL Apply 1 application topically 4 (four) times daily as needed (pain).    [provider]  diphenhydramine-acetaminophen (TYLENOL PM) 25-500 MG TABS tablet Take 1 tablet by mouth at bedtime as needed (sleep).    [provider]  docusate sodium (COLACE) 100 MG capsule Take 100 mg by mouth daily as needed for mild constipation.    [provider]  famotidine (PEPCID) 20 MG tablet Take 20 mg by mouth daily.    [provider]  fluticasone (FLONASE) 50 MCG/ACT nasal spray Place 2 sprays into both nostrils as needed for allergies or rhinitis.    [provider]  folic acid (FOLVITE) 409 MCG tablet Take 800 mcg by mouth daily.    [provider]  glucose blood test strip 1 each by Other route as needed. Use as instructed    [provider]  lidocaine-prilocaine (EMLA) cream Apply 1 application topically daily as needed (dialysis). 08/12/20   [provider]  linagliptin (TRADJENTA) 5 MG TABS tablet Take 5 mg by mouth daily.    [provider]  loperamide (IMODIUM A-D) 2 MG tablet Take 2 mg by mouth 4 (four) times daily as needed for diarrhea or loose stools.    [provider]  Methoxy PEG-Epoetin Beta (MIRCERA IJ) Mircera 09/20/20   [provider]  montelukast (SINGULAIR) 10 MG tablet Take 10 mg by mouth daily as needed (allergies).    [provider]  neomycin-bacitracin-polymyxin (NEOSPORIN) ointment Apply 1  application topically as needed for wound care.    [provider]  oxyCODONE (OXY IR/ROXICODONE) 5 MG immediate release tablet Take 1 tablet (5 mg total) by mouth every 6 (six) hours as needed for moderate pain. 11/02/20   Dagoberto Ligas, PA-C  Vitamin D3 (VITAMIN D) 25 MCG tablet Take 2,000 Units by mouth every evening.    [provider]    Physical Exam: Vitals:   11/25/20 1424 11/25/20 1530 11/25/20 1545 11/25/20 1615  BP:  (!) 116/58 111/70 (!) 113/58  Pulse:  73 70 70  Resp:  13 18 15   Temp:      TempSrc:      SpO2:  99% 97% 99%  Weight: 62.1 kg     Height: 5\' 3"  (1.6 m)       Constitutional: NAD, calm, comfortable Vitals:   11/25/20 1424 11/25/20 1530 11/25/20 1545 11/25/20 1615  BP:  (!) 116/58 111/70 (!) 113/58  Pulse:  73 70 70  Resp:  13 18 15   Temp:      TempSrc:      SpO2:  99% 97% 99%  Weight: 62.1 kg     Height: 5\' 3"  (1.6 m)      Eyes: PERRL, lids and conjunctivae normal, chronic ill appearance ENMT: Mucous membranes are moist. Posterior pharynx clear of any exudate or lesions.Normal dentition.  Neck: normal, supple, no masses, no thyromegaly Respiratory: clear to auscultation bilaterally, no wheezing, no crackles. Normal respiratory effort. No accessory muscle use.  Cardiovascular: Regular rate and rhythm, no murmurs / rubs / gallops. 2+ extremity edema. 2+ pedal pulses. No carotid bruits.  Abdomen: no tenderness, no masses palpated. No hepatosplenomegaly. Bowel sounds positive.  Musculoskeletal: no clubbing / cyanosis. No joint deformity upper and lower extremities. Good ROM, no contractures. Normal muscle tone.  Skin: Nonhealing wound of left inner mid thigh, several large ulcers on bilateral hips with multiple deep ulcers with eschar-like top and mottled skin changes around, B/L multiple toes gangrene-like changes Neurologic: CN 2-12 grossly intact. Sensation intact, DTR normal. Strength 5/5 in all 4.  Psychiatric: Normal judgment and  insight.  Alert and oriented x 3. Normal mood.              Labs on Admission: I have personally reviewed following labs and imaging studies  CBC: Recent Labs  Lab 11/25/20 1412 11/25/20 1512  WBC 19.0*  --   NEUTROABS 14.3*  --   HGB 10.3* 9.2*  HCT 34.0* 27.0*  MCV 106.9*  --   PLT 374  --    Basic Metabolic Panel: Recent Labs  Lab 11/25/20 1412 11/25/20 1512  NA 134* 135  K 3.5 3.3*  CL 97* 100  CO2 22  --   GLUCOSE 153* 112*  BUN 22 26*  CREATININE 3.97* 3.80*  CALCIUM 8.7*  --    GFR: Estimated Creatinine Clearance: 11.9 mL/min (A) (by C-G formula based on SCr of 3.8 mg/dL (H)). Liver Function Tests: Recent Labs  Lab 11/25/20 1412  AST 24  ALT 14  ALKPHOS 209*  BILITOT 0.6  PROT 6.1*  ALBUMIN 1.9*   No results for input(s): LIPASE, AMYLASE in the last 168 hours. No results for input(s): AMMONIA in the last 168 hours. Coagulation Profile: Recent Labs  Lab 11/25/20 1412  INR 2.3*   Cardiac Enzymes: No results for input(s): CKTOTAL, CKMB, CKMBINDEX, TROPONINI in the last 168 hours. BNP (last 3 results) No results for input(s): PROBNP in the last 8760 hours. HbA1C: No results for input(s): HGBA1C in the last 72 hours. CBG: No results for input(s): GLUCAP in the last 168 hours. Lipid Profile: No results for input(s): CHOL, HDL, LDLCALC, TRIG, CHOLHDL, LDLDIRECT in the last 72 hours. Thyroid Function Tests: No results for input(s): TSH, T4TOTAL, FREET4, T3FREE, THYROIDAB in the last 72 hours. Anemia Panel: No results for input(s): VITAMINB12, FOLATE, FERRITIN, TIBC, IRON, RETICCTPCT in the last 72 hours. Urine analysis:    Component Value Date/Time   COLORURINE AMBER (A) 09/30/2020 1035   APPEARANCEUR CLOUDY (A) 09/30/2020 1035   LABSPEC 1.012 09/30/2020 1035   PHURINE 8.0 09/30/2020 1035   GLUCOSEU NEGATIVE 09/30/2020 1035   HGBUR NEGATIVE 09/30/2020 1035   BILIRUBINUR NEGATIVE 09/30/2020 1035   KETONESUR NEGATIVE 09/30/2020 1035    PROTEINUR 30 (A) 09/30/2020 1035   NITRITE NEGATIVE 09/30/2020 1035   LEUKOCYTESUR LARGE (A) 09/30/2020 1035    Radiological Exams on Admission: DG Chest Port 1 View  Result Date: 11/25/2020 CLINICAL DATA:  Questionable sepsis, evaluate for abnormality. EXAM: PORTABLE CHEST 1 VIEW COMPARISON:  Chest radiographs 09/29/2020 and earlier. FINDINGS: Unchanged position of a right-sided dialysis catheter with tip projecting at the level of the superior cavoatrial junction/upper right atrium. Prior median sternotomy/CABG. Heart size within normal limits. Aortic atherosclerosis. No appreciable airspace consolidation or pulmonary edema. No evidence of pleural effusion or pneumothorax. No acute bony abnormality identified. IMPRESSION: No evidence of acute cardiopulmonary abnormality. Aortic Atherosclerosis (ICD10-I70.0). Electronically Signed   By: Kellie Simmering DO   On: 11/25/2020 15:01    EKG: Independently reviewed. Sinus, no acute ST changes  Assessment/Plan Active Problems:   Limb ischemia  (please populate well all problems here in Problem List. (For example, if patient is on BP meds at home and you resume or decide to hold them, it is a problem that needs to be her. Same for CAD, COPD, HLD and so on)  Right thigh unhealing ulcer, infected -Start vancomycin plus cefepime for deep wound infection. -Vascular surgery consulted, plans for debridement.  Switch Eliquis to heparin drip. -Overall prognosis poor given extensiveness of her gangrene-like changes implying severe PAD and blood  supply compromise.  Discussed with patient and her daughter at bedside, both still desire full code and hopeful about wound healing.  Consult nutrition.  Chronic gangrene-like changes of bilateral multiple toes and bilateral hip area -Heparin drip for now. -Patient has under gone extensive vascular study this year.  Lactic acidosis -Doubt this is from severe infection, more likely this represent multiple lower  extremity gangrene  HTN with hypotension -Hold home BP meds for now.  CAD -No acute issue  PAF -Heparin drip for now  IIDM -Continue Tradjenta  Hypoalbuminemia -Malnutrition, suspect still has proteinuria  ESRD on HD -HD TTS.  Euvolemic to mild overload, no indication for HD today  Leukocytosis -Likely from infection and limb ischemia, antibiotics and heparin drip for now.  DVT prophylaxis: Heparin drip Code Status: Full code Family Communication: Daughter at bedside Disposition Plan: Expect 3 to 5 days hospital stay Consults called: Vascular surgery, nephrology Admission status: Telemetry admission   Lequita Halt MD [facility] Pager [phone]  11/25/2020, 4:36 PM

## 2020-11-25 NOTE — Progress Notes (Signed)
     Subjective:     Patient ID: Kathryn Hancock, female   DOB: 04/16/1951, 70 y.o.   MRN: [id number]  HPI 70 year old female with most recent vascular history right femoral to above-knee popliteal artery bypass for dry gangrene of her toes.  She actually has gangrenous changes of all of her toes bilaterally.  Most recently she has had some drainage from her toes particularly on the right greater than left.  She is here today for wound on her right thigh which began draining a few days ago.  She has not had any fevers.  The drainage has been purulent.  She does have significant pain.  Patient is on dialysis.   Review of Systems Right lower extremity swelling Right thigh wound with purulent drainage    Objective:   Physical Exam Vitals:   11/25/20 1204  BP: (!) 76/44  Pulse: 73  Resp: 20  Temp: 98 F (36.7 C)  SpO2: 97%   She is alert and responds appropriately although somewhat somnolent On the respirations Right groin wound with some fibrinous exudate distally Right thigh wound from saphenectomy site with necrotic changes packed with wet-to-dry All 10 toes with gangrenous changes Strong right posterior tibial artery signal    Assessment:     7 female with end-stage renal disease status post right femoral to popliteal artery bypass with vein.  Unfortunately she now has a necrotic wound from her saphenectomy site and appears to overall have failure to thrive.    Plan:     I have instructed her to go to ED via private vehicle.  She will likely need debridement of her right thigh wound possible amputation of her toes during this admission.  Overall patient appears quite debilitated.    Bobby Ragan C. Donzetta Matters, MD [facility] Office: [phone] Pager: [phone]

## 2020-11-25 NOTE — Progress Notes (Signed)
Pharmacy Antibiotic Note  Kathryn Hancock is a 70 y.o. female admitted on 11/25/2020 with wound infection of sacrum and feet  Pharmacy has been consulted for vancomycin and cefepime dosing.  Toes are remarkably gangrenous, also has wound to back and inner-thigh. Leukocytosis (19), afebrile, LA 4.6. BP stable, HR normal.   Plan: Cefepime 1g q24hr Vancomycin 1250mg  IV x1 Then vancomycin 750mg  qHD F/u HD schedule, cultures, signs of infections  Height: 5\' 3"  (160 cm) Weight: 62.1 kg (137 lb) IBW/kg (Calculated) : 52.4  Temp (24hrs), Avg:97.9 F (36.6 C), Min:97 F (36.1 C), Max:98.7 F (37.1 C)  Recent Labs  Lab 11/25/20 1406 11/25/20 1412 11/25/20 1512  WBC  --  19.0*  --   CREATININE  --  3.97* 3.80*  LATICACIDVEN 4.6*  --   --     Estimated Creatinine Clearance: 11.9 mL/min (A) (by C-G formula based on SCr of 3.8 mg/dL (H)).    Allergies  Allergen Reactions   Penicillins Anaphylaxis    Anaphylaxis as a child    Antimicrobials this admission: Cefepime 6/17 >> Vanc 6/17 >>  Dose adjustments this admission: NA  Microbiology results: 6/17 BCx: pend  Thank you for allowing pharmacy to be a part of this patient's care.  Norina Buzzard, PharmD PGY1 Pharmacy Resident 11/25/2020 5:02 PM

## 2020-11-25 NOTE — Progress Notes (Signed)
ANTICOAGULATION CONSULT NOTE - Initial Consult  Pharmacy Consult for heparin Indication: atrial fibrillation  Allergies  Allergen Reactions   Penicillins Anaphylaxis    Anaphylaxis as a child    Patient Measurements: Height: 5\' 3"  (160 cm) Weight: 62.1 kg (137 lb) IBW/kg (Calculated) : 52.4 Heparin Dosing Weight: 62.1kg  Vital Signs: Temp: 97 F (36.1 C) (06/17 1423) Temp Source: Temporal (06/17 1423) BP: 113/58 (06/17 1615) Pulse Rate: 70 (06/17 1615)  Labs: Recent Labs    11/25/20 1412 11/25/20 1512  HGB 10.3* 9.2*  HCT 34.0* 27.0*  PLT 374  --   APTT 45*  --   LABPROT 25.0*  --   INR 2.3*  --   CREATININE 3.97* 3.80*    Estimated Creatinine Clearance: 11.9 mL/min (A) (by C-G formula based on SCr of 3.8 mg/dL (H)).   Medical History: Past Medical History:  Diagnosis Date   AAA (abdominal aortic aneurysm) ([place]) 06/2019   Multiple small pseudoaneurysmal projections of the dominant aorta.  Distal abdominal aortic aneurysm 4.5 x 4.7 cm.  Greatest AP dimension of the infrarenal aorta is 4.9.  No evidence of thoracic aortic aneurysm or dissection.  Brief segment of proximal IMA occlusion.   Anemia    Coronary artery disease involving native coronary artery of native heart without angina pectoris 06/01/2020   Cardiac cath at [facility] 06/01/2020-preop for atrial myxoma resection -> severe proximal RCA and PDA -> had single-vessel CABG with SVG-PDA along with myxoma resection.   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 05/27/2020   ESRD (end stage renal disease) on dialysis ([place]) 06/2020   Progression of CKDIIIb to ESRD initially related to thromboembolic event from left atrial myxoma; complicated by perioperative hypotension-; now 1 on HD TU/TH/SAT @ [place]   GERD (gastroesophageal reflux disease)    Heavy smoker (more than 20 cigarettes per day)     ~ 2 ppd; since age 32 (65 pk yr) = has cut down to one half PPD.>   Hyperlipidemia associated with type 2 diabetes mellitus ([place]) 08/05/2020   Hypertension    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 = Status post resection December 23,2021 (done at [facility] because of no bed availability at [facility]   Microscopic hematuria    PAF (paroxysmal atrial fibrillation) ([place]) 07/01/2020   Initially noted postoperatively-left atrial myxoma resection and CABG x1.  Now on amiodarone and apixaban..   Peripheral vascular disease ([place])    Bilateral SFA & ATA occlusion.  2 V runoffvia Peroneal A & PTA. --> s/p L Fem-Pop (AK pop A) bypass (PFTE) 09/12/2020 - pending R Fem-AKPop bypass   Plantar wart of right foot    PONV (postoperative nausea and vomiting)     Medications:  Scheduled:   amiodarone  200 mg Oral Daily   vitamin C  1,000 mg Oral QPM   aspirin EC  81 mg Oral Daily   atorvastatin  20 mg Oral Daily   collagenase  1 application Topical Q3 days   famotidine  20 mg Oral Daily   ferric citrate  210 mg Oral BID   folic acid  643 mcg Oral Daily   insulin aspart  0-6 Units Subcutaneous TID WC   linagliptin  5 mg Oral Daily   Vitamin D3  2,000 Units Oral QPM  Assessment: Patient with afib. On Eliquis PTA. Hgb stable 9-10, PLT 300s at baseline.   Goal of Therapy:  APTT 66-102 Heparin level 0.3-0.7 units/ml Monitor platelets by anticoagulation protocol: Yes   Plan:  Start heparin infusion at 750 units/hr Check anti-Xa and APTT level in 8 hours and daily while on heparin Continue to monitor H&H and platelets   Norina Buzzard, PharmD PGY1 Pharmacy Resident 11/25/2020 5:06 PM

## 2020-11-25 NOTE — ED Notes (Signed)
Report attempted x 1

## 2020-11-25 NOTE — Progress Notes (Signed)
Pt arrived to unit from ED. A/O x 4, CCMD called ,CHG given, pt oriented to unit. Pt multiple wounds on buttocks and measured. Right thigh open wound packed with 1 4x4 and ABD with tape.,Will continue to monitor.   Albin Felling Lexy Meininger, RN     11/25/20 1935  Vitals  Temp 98.3 F (36.8 C)  Temp Source Oral  BP (!) 141/64  MAP (mmHg) 87  BP Location Right Arm  BP Method Automatic  Patient Position (if appropriate) Lying  Pulse Rate 78  Pulse Rate Source Monitor  ECG Heart Rate 77  Resp 20  Level of Consciousness  Level of Consciousness Alert  Oxygen Therapy  SpO2 94 %  O2 Device Room Air  O2 Flow Rate (L/min) 0 L/min  MEWS Score  MEWS Temp 0  MEWS Systolic 0  MEWS Pulse 0  MEWS RR 0  MEWS LOC 0  MEWS Score 0  MEWS Score Color Nyoka Cowden

## 2020-11-26 ENCOUNTER — Encounter (HOSPITAL_COMMUNITY): Payer: Self-pay | Admitting: Internal Medicine

## 2020-11-26 LAB — APTT
aPTT: 58 seconds — ABNORMAL HIGH (ref 24–36)
aPTT: 69 seconds — ABNORMAL HIGH (ref 24–36)

## 2020-11-26 LAB — CBC
HCT: 26.7 % — ABNORMAL LOW (ref 36.0–46.0)
Hemoglobin: 8.1 g/dL — ABNORMAL LOW (ref 12.0–15.0)
MCH: 31.9 pg (ref 26.0–34.0)
MCHC: 30.3 g/dL (ref 30.0–36.0)
MCV: 105.1 fL — ABNORMAL HIGH (ref 80.0–100.0)
Platelets: 247 10*3/uL (ref 150–400)
RBC: 2.54 MIL/uL — ABNORMAL LOW (ref 3.87–5.11)
RDW: 20.9 % — ABNORMAL HIGH (ref 11.5–15.5)
WBC: 15.9 10*3/uL — ABNORMAL HIGH (ref 4.0–10.5)
nRBC: 0.1 % (ref 0.0–0.2)

## 2020-11-26 LAB — BASIC METABOLIC PANEL
Anion gap: 13 (ref 5–15)
BUN: 28 mg/dL — ABNORMAL HIGH (ref 8–23)
CO2: 21 mmol/L — ABNORMAL LOW (ref 22–32)
Calcium: 8.2 mg/dL — ABNORMAL LOW (ref 8.9–10.3)
Chloride: 100 mmol/L (ref 98–111)
Creatinine, Ser: 4.23 mg/dL — ABNORMAL HIGH (ref 0.44–1.00)
GFR, Estimated: 11 mL/min — ABNORMAL LOW (ref >60)
Glucose, Bld: 90 mg/dL (ref 70–99)
Potassium: 4.3 mmol/L (ref 3.5–5.1)
Sodium: 134 mmol/L — ABNORMAL LOW (ref 135–145)

## 2020-11-26 LAB — GLUCOSE, CAPILLARY
Glucose-Capillary: 94 mg/dL (ref 70–99)
Glucose-Capillary: 130 mg/dL — ABNORMAL HIGH (ref 70–99)

## 2020-11-26 LAB — HEPARIN LEVEL (UNFRACTIONATED)
Heparin Unfractionated: >1.1 IU/mL — ABNORMAL HIGH (ref 0.30–0.70)
Heparin Unfractionated: >1.1 IU/mL — ABNORMAL HIGH (ref 0.30–0.70)

## 2020-11-26 LAB — LACTIC ACID, PLASMA: Lactic Acid, Venous: 2.1 mmol/L (ref 0.5–1.9)

## 2020-11-26 MED ORDER — SEVELAMER CARBONATE 800 MG PO TABS
800.0000 mg | ORAL_TABLET | Freq: Three times a day (TID) | ORAL | Status: DC
  Administered 2020-11-26 – 2020-12-13 (×33): 800 mg via ORAL
  Filled 2020-11-26 (×34): qty 1

## 2020-11-26 MED ORDER — VANCOMYCIN HCL 750 MG/150ML IV SOLN
INTRAVENOUS | Status: AC
  Filled 2020-11-26: qty 150

## 2020-11-26 MED ORDER — ONDANSETRON HCL 4 MG/2ML IJ SOLN
INTRAMUSCULAR | Status: AC
  Filled 2020-11-26: qty 2

## 2020-11-26 MED ORDER — CHLORHEXIDINE GLUCONATE CLOTH 2 % EX PADS: 6.0000 | MEDICATED_PAD | Freq: Every day | CUTANEOUS | Status: DC

## 2020-11-26 MED ORDER — PROSOURCE PLUS PO LIQD
30.0000 mL | Freq: Two times a day (BID) | ORAL | Status: DC
  Filled 2020-11-26: qty 30

## 2020-11-26 MED ORDER — JUVEN PO PACK
1.0000 | PACK | Freq: Two times a day (BID) | ORAL | Status: DC
  Administered 2020-11-27 – 2020-11-28 (×3): 1 via ORAL
  Filled 2020-11-26 (×4): qty 1

## 2020-11-26 MED ORDER — SODIUM THIOSULFATE 250 MG/ML IV SOLN
25.0000 g | INTRAVENOUS | Status: DC
  Administered 2020-11-26 – 2020-12-03 (×3): 25 g via INTRAVENOUS
  Filled 2020-11-26 (×5): qty 100

## 2020-11-26 MED ORDER — ASCORBIC ACID 500 MG PO TABS
500.0000 mg | ORAL_TABLET | Freq: Two times a day (BID) | ORAL | Status: DC
  Administered 2020-11-27 – 2020-12-13 (×31): 500 mg via ORAL
  Filled 2020-11-26 (×30): qty 1

## 2020-11-26 MED ORDER — RENA-VITE PO TABS
1.0000 | ORAL_TABLET | Freq: Every day | ORAL | Status: DC
  Administered 2020-11-26 – 2020-12-12 (×17): 1 via ORAL
  Filled 2020-11-26 (×16): qty 1

## 2020-11-26 MED ORDER — HYDROMORPHONE HCL 1 MG/ML IJ SOLN
INTRAMUSCULAR | Status: AC
  Filled 2020-11-26: qty 1

## 2020-11-26 MED ORDER — ONDANSETRON HCL 4 MG/2ML IJ SOLN
4.0000 mg | Freq: Four times a day (QID) | INTRAMUSCULAR | Status: DC | PRN
  Administered 2020-11-26 – 2020-12-13 (×11): 4 mg via INTRAVENOUS
  Filled 2020-11-26 (×8): qty 2

## 2020-11-26 MED ORDER — CHLORHEXIDINE GLUCONATE CLOTH 2 % EX PADS
6.0000 | MEDICATED_PAD | Freq: Every day | CUTANEOUS | Status: DC
  Administered 2020-11-26 – 2020-11-30 (×6): 6 via TOPICAL

## 2020-11-26 MED ORDER — HEPARIN SODIUM (PORCINE) 1000 UNIT/ML IJ SOLN
INTRAMUSCULAR | Status: AC
  Filled 2020-11-26: qty 4

## 2020-11-26 MED ORDER — NEPRO/CARBSTEADY PO LIQD: 237.0000 mL | Freq: Two times a day (BID) | ORAL | Status: DC

## 2020-11-26 NOTE — Progress Notes (Signed)
ANTICOAGULATION CONSULT NOTE   Pharmacy Consult for heparin Indication: atrial fibrillation  Allergies  Allergen Reactions   Penicillins Anaphylaxis    Anaphylaxis as a child    Patient Measurements: Height: 5\' 3"  (160 cm) Weight: 62.1 kg (137 lb) IBW/kg (Calculated) : 52.4 Heparin Dosing Weight: 62.1kg  Vital Signs: Temp: 98.2 F (36.8 C) (06/18 0506) Temp Source: Oral (06/18 0506) BP: 110/49 (06/18 0506) Pulse Rate: 69 (06/18 0506)  Labs: Recent Labs    11/25/20 1412 11/25/20 1512 11/26/20 0550  HGB 10.3* 9.2* 8.1*  HCT 34.0* 27.0* 26.7*  PLT 374  --  247  APTT 45*  --  58*  LABPROT 25.0*  --   --   INR 2.3*  --   --   HEPARINUNFRC  --   --  >1.10*  CREATININE 3.97* 3.80* 4.23*     Estimated Creatinine Clearance: 10.7 mL/min (A) (by C-G formula based on SCr of 4.23 mg/dL (H)).   Medical History: Past Medical History:  Diagnosis Date   AAA (abdominal aortic aneurysm) ([place]) 06/2019   Multiple small pseudoaneurysmal projections of the dominant aorta.  Distal abdominal aortic aneurysm 4.5 x 4.7 cm.  Greatest AP dimension of the infrarenal aorta is 4.9.  No evidence of thoracic aortic aneurysm or dissection.  Brief segment of proximal IMA occlusion.   Anemia    Coronary artery disease involving native coronary artery of native heart without angina pectoris 06/01/2020   Cardiac cath at [facility] 06/01/2020-preop for atrial myxoma resection -> severe proximal RCA and PDA -> had single-vessel CABG with SVG-PDA along with myxoma resection.   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 05/27/2020   ESRD (end stage renal disease) on dialysis ([place]) 06/2020   Progression of CKDIIIb to ESRD initially related to thromboembolic event from left atrial myxoma; complicated by perioperative hypotension-; now 1 on HD TU/TH/SAT @ [place]   GERD (gastroesophageal  reflux disease)    Heavy smoker (more than 20 cigarettes per day)    ~ 2 ppd; since age 42 (6 pk yr) = has cut down to one half PPD.>   Hyperlipidemia associated with type 2 diabetes mellitus ([place]) 08/05/2020   Hypertension    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 = Status post resection December 23,2021 (done at [facility] because of no bed availability at [facility]   Microscopic hematuria    PAF (paroxysmal atrial fibrillation) ([place]) 07/01/2020   Initially noted postoperatively-left atrial myxoma resection and CABG x1.  Now on amiodarone and apixaban..   Peripheral vascular disease ([place])    Bilateral SFA & ATA occlusion.  2 V runoffvia Peroneal A & PTA. --> s/p L Fem-Pop (AK pop A) bypass (PFTE) 09/12/2020 - pending R Fem-AKPop bypass   Plantar wart of right foot    PONV (postoperative nausea and vomiting)     Medications:  Scheduled:   amiodarone  200 mg Oral Daily   vitamin C  1,000 mg Oral QPM   aspirin EC  81 mg Oral Daily   atorvastatin  20 mg Oral Daily   Chlorhexidine Gluconate Cloth  6 each Topical Daily   cholecalciferol  2,000 Units Oral QPM   famotidine  20 mg Oral Daily   ferric citrate  210 mg Oral BID   folic acid  1 mg Oral Daily  insulin aspart  0-6 Units Subcutaneous TID WC   linagliptin  5 mg Oral Daily    Assessment: Patient with afib. On Eliquis PTA. Hgb stable 9-10, PLT 300s at baseline.   6/18 AM update:  APTT just below goal  Goal of Therapy:  APTT 66-102 Heparin level 0.3-0.7 units/ml Monitor platelets by anticoagulation protocol: Yes   Plan:  Inc heparin to 800 units/hr Check anti-Xa and APTT level in 8 hours and daily while on heparin Continue to monitor H&H and platelets  Narda Bonds, PharmD, [place] Pharmacist Phone: [phone]

## 2020-11-26 NOTE — Progress Notes (Signed)
Progress Note    Kathryn Hancock  EXB:[id number] DOB: 10/07/1950  DOA: 11/25/2020 PCP: Gala Lewandowsky, MD      Brief Narrative:    Medical records reviewed and are as summarized below:  Kathryn Hancock is a 70 y.o. female with medical history significant of PAD with status post left and right leg bypasses, chronic bilateral toes ischemia, unhealing wound of right thigh after right femoropopliteal bypass, ESRD on HD TTS, PAF on Eliquis, IDDM, HTN, CAD status post CABG, HTN, HLD, who presented with worsening of gangrene-like changes on bilateral toes and non healing ulcers on the right thigh and on bilateral hips.  She also complained of pain in bilateral hip wounds especially during dialysis.  Family also noticed increasing bilateral leg swelling.   Patient has had bilateral toe ulcers since March this year due to bilateral embolization event, and gradually, change into gangrene-like changes over the last 73-months. She underwent a right femoropopliteal in May 2022.  However, surgical wound on the right thigh never healed despite wound care at home.  Wounds on bilateral hips have also become larger and deeper despite local wound care at home.    Assessment/Plan:   Active Problems:   ESRD (end stage renal disease) on dialysis ([place])   Limb ischemia    Body mass index is 24.27 kg/m.  Right thigh nonhealing ulcers, infected: Continue Parikh IV antibiotics.  Continue IV heparin infusion and monitor heparin level per protocol.  Follow-up with vascular surgeon for further recommendations.  Analgesics as needed for pain.  PVD with bilateral gangrene of toes: Continue IV heparin and follow-up with vascular surgeon.  Lactic acidosis: This could be from infection or from gangrene of the lower extremities.  Hypotension: Hold antihypertensives  Paroxysmal atrial fibrillation: Eliquis has been held  ESRD: Follow-up with nephrologist for hemodialysis  Other comorbidities include  insulin-dependent diabetes mellitus, CAD, hypertension, hypoalbuminemia   Diet Order             Diet renal/carb modified with fluid restriction Diet-HS Snack? Nothing; Fluid restriction: 1200 mL Fluid; Room service appropriate? Yes; Fluid consistency: Thin  Diet effective now                      Consultants: Nephrologist Vascular surgeon  Procedures: None    Medications:    (feeding supplement) PROSource Plus  30 mL Oral BID BM   amiodarone  200 mg Oral Daily   vitamin C  1,000 mg Oral QPM   aspirin EC  81 mg Oral Daily   atorvastatin  20 mg Oral Daily   Chlorhexidine Gluconate Cloth  6 each Topical Q0600   famotidine  20 mg Oral Daily   folic acid  1 mg Oral Daily   heparin sodium (porcine)       insulin aspart  0-6 Units Subcutaneous TID WC   linagliptin  5 mg Oral Daily   sevelamer carbonate  800 mg Oral TID WC   Continuous Infusions:  ceFEPime (MAXIPIME) IV     heparin 800 Units/hr (11/26/20 0943)   sodium thiosulfate infusion for calciphylaxis 25 g (11/26/20 1354)   vancomycin 750 mg (11/26/20 1353)     Anti-infectives (From admission, onward)    Start     Dose/Rate Route Frequency Ordered Stop   11/26/20 1700  ceFEPIme (MAXIPIME) 1 g in sodium chloride 0.9 % 100 mL IVPB        1 g 200 mL/hr over 30 Minutes Intravenous Every 24 hours  11/25/20 1713     11/26/20 1200  vancomycin (VANCOREADY) IVPB 750 mg/150 mL        750 mg 150 mL/hr over 60 Minutes Intravenous Every T-Th-Sa (Hemodialysis) 11/25/20 1714     11/26/20 1126  vancomycin (VANCOREADY) 750 MG/150ML IVPB       Note to Pharmacy: Cherylann Banas   : cabinet override      11/26/20 1126 11/26/20 1200   11/26/20 1125  vancomycin (VANCOREADY) 750 MG/150ML IVPB       Note to Pharmacy: Cherylann Banas   : cabinet override      11/26/20 1125 11/26/20 1159   11/25/20 1700  vancomycin (VANCOREADY) IVPB 1250 mg/250 mL        1,250 mg 166.7 mL/hr over 90 Minutes Intravenous  Once 11/25/20 1645 11/26/20  0053   11/25/20 1645  ceFEPIme (MAXIPIME) 2 g in sodium chloride 0.9 % 100 mL IVPB        2 g 200 mL/hr over 30 Minutes Intravenous  Once 11/25/20 1644 11/25/20 2255   11/25/20 1500  clindamycin (CLEOCIN) IVPB 600 mg        600 mg 100 mL/hr over 30 Minutes Intravenous  Once 11/25/20 1459 11/25/20 1606              Family Communication/Anticipated D/C date and plan/Code Status   DVT prophylaxis:      Code Status: Full Code  Family Communication: None Disposition Plan:    Status is: Inpatient  Remains inpatient appropriate because:IV treatments appropriate due to intensity of illness or inability to take PO  Dispo: The patient is from: Home              Anticipated d/c is to: Home              Patient currently is not medically stable to d/c.   Difficult to place patient No           Subjective:   Interval events noted. C/o nausea  Objective:    Vitals:   11/26/20 1130 11/26/20 1200 11/26/20 1230 11/26/20 1300  BP: (!) 100/51 (!) 95/56 (!) 97/50 (!) 116/51  Pulse: 72 76    Resp: 18 18    Temp:      TempSrc:      SpO2:      Weight:      Height:       No data found.   Intake/Output Summary (Last 24 hours) at 11/26/2020 1411 Last data filed at 11/26/2020 0600 Gross per 24 hour  Intake 240 ml  Output 550 ml  Net -310 ml   Filed Weights   11/25/20 1424  Weight: 62.1 kg    Exam:  GEN: NAD SKIN: Bilateral gangrenous toes.  Multiple wounds with eschar on the left buttock, medial aspect of the middle of the lower third of the right thigh. EYES: No pallor or icterus ENT: MMM CV: RRR PULM: CTA B ABD: soft, ND, obese, +BS CNS: AAO x 3, non focal EXT: Significant bilateral leg and pedal edema.  No tenderness.                Data Reviewed:   I have personally reviewed following labs and imaging studies:  Labs: Labs show the following:   Basic Metabolic Panel: Recent Labs  Lab 11/25/20 1412 11/25/20 1512 11/26/20 0550   NA 134* 135 134*  K 3.5 3.3* 4.3  CL 97* 100 100  CO2 22  --  21*  GLUCOSE 153*  112* 90  BUN 22 26* 28*  CREATININE 3.97* 3.80* 4.23*  CALCIUM 8.7*  --  8.2*   GFR Estimated Creatinine Clearance: 10.7 mL/min (A) (by C-G formula based on SCr of 4.23 mg/dL (H)). Liver Function Tests: Recent Labs  Lab 11/25/20 1412  AST 24  ALT 14  ALKPHOS 209*  BILITOT 0.6  PROT 6.1*  ALBUMIN 1.9*   No results for input(s): LIPASE, AMYLASE in the last 168 hours. No results for input(s): AMMONIA in the last 168 hours. Coagulation profile Recent Labs  Lab 11/25/20 1412  INR 2.3*    CBC: Recent Labs  Lab 11/25/20 1412 11/25/20 1512 11/26/20 0550  WBC 19.0*  --  15.9*  NEUTROABS 14.3*  --   --   HGB 10.3* 9.2* 8.1*  HCT 34.0* 27.0* 26.7*  MCV 106.9*  --  105.1*  PLT 374  --  247   Cardiac Enzymes: No results for input(s): CKTOTAL, CKMB, CKMBINDEX, TROPONINI in the last 168 hours. BNP (last 3 results) No results for input(s): PROBNP in the last 8760 hours. CBG: Recent Labs  Lab 11/25/20 1651 11/26/20 0622  GLUCAP 114* 94   D-Dimer: No results for input(s): DDIMER in the last 72 hours. Hgb A1c: No results for input(s): HGBA1C in the last 72 hours. Lipid Profile: No results for input(s): CHOL, HDL, LDLCALC, TRIG, CHOLHDL, LDLDIRECT in the last 72 hours. Thyroid function studies: No results for input(s): TSH, T4TOTAL, T3FREE, THYROIDAB in the last 72 hours.  Invalid input(s): FREET3 Anemia work up: No results for input(s): VITAMINB12, FOLATE, FERRITIN, TIBC, IRON, RETICCTPCT in the last 72 hours. Sepsis Labs: Recent Labs  Lab 11/25/20 1406 11/25/20 1412 11/25/20 2302 11/26/20 0550  WBC  --  19.0*  --  15.9*  LATICACIDVEN 4.6*  --  3.8* 2.1*    Microbiology Recent Results (from the past 240 hour(s))  Resp Panel by RT-PCR (Flu A&B, Covid) Nasopharyngeal Swab     Status: None   Collection Time: 11/25/20  2:06 PM   Specimen: Nasopharyngeal Swab; Nasopharyngeal(NP)  swabs in vial transport medium  Result Value Ref Range Status   SARS Coronavirus 2 by RT PCR NEGATIVE NEGATIVE Final    Comment: (NOTE) SARS-CoV-2 target nucleic acids are NOT DETECTED.  The SARS-CoV-2 RNA is generally detectable in upper respiratory specimens during the acute phase of infection. The lowest concentration of SARS-CoV-2 viral copies this assay can detect is 138 copies/mL. A negative result does not preclude SARS-Cov-2 infection and should not be used as the sole basis for treatment or other patient management decisions. A negative result may occur with  improper specimen collection/handling, submission of specimen other than nasopharyngeal swab, presence of viral mutation(s) within the areas targeted by this assay, and inadequate number of viral copies(<138 copies/mL). A negative result must be combined with clinical observations, patient history, and epidemiological information. The expected result is Negative.  Fact Sheet for Patients:  EntrepreneurPulse.com.au  Fact Sheet for Healthcare Providers:  IncredibleEmployment.be  This test is no t yet approved or cleared by the [place] FDA and  has been authorized for detection and/or diagnosis of SARS-CoV-2 by FDA under an Emergency Use Authorization (EUA). This EUA will remain  in effect (meaning this test can be used) for the duration of the COVID-19 declaration under Section 564(b)(1) of the Act, 21 U.S.C.section 360bbb-3(b)(1), unless the authorization is terminated  or revoked sooner.       Influenza A by PCR NEGATIVE NEGATIVE Final   Influenza B by PCR  NEGATIVE NEGATIVE Final    Comment: (NOTE) The Xpert Xpress SARS-CoV-2/FLU/RSV plus assay is intended as an aid in the diagnosis of influenza from Nasopharyngeal swab specimens and should not be used as a sole basis for treatment. Nasal washings and aspirates are unacceptable for Xpert Xpress  SARS-CoV-2/FLU/RSV testing.  Fact Sheet for Patients: EntrepreneurPulse.com.au  Fact Sheet for Healthcare Providers: IncredibleEmployment.be  This test is not yet approved or cleared by the [place] FDA and has been authorized for detection and/or diagnosis of SARS-CoV-2 by FDA under an Emergency Use Authorization (EUA). This EUA will remain in effect (meaning this test can be used) for the duration of the COVID-19 declaration under Section 564(b)(1) of the Act, 21 U.S.C. section 360bbb-3(b)(1), unless the authorization is terminated or revoked.  Performed at [facility], [place] [address], [place], [place] [address]     Procedures and diagnostic studies:  DG Chest Port 1 View  Result Date: 11/25/2020 CLINICAL DATA:  Questionable sepsis, evaluate for abnormality. EXAM: PORTABLE CHEST 1 VIEW COMPARISON:  Chest radiographs 09/29/2020 and earlier. FINDINGS: Unchanged position of a right-sided dialysis catheter with tip projecting at the level of the superior cavoatrial junction/upper right atrium. Prior median sternotomy/CABG. Heart size within normal limits. Aortic atherosclerosis. No appreciable airspace consolidation or pulmonary edema. No evidence of pleural effusion or pneumothorax. No acute bony abnormality identified. IMPRESSION: No evidence of acute cardiopulmonary abnormality. Aortic Atherosclerosis (ICD10-I70.0). Electronically Signed   By: Kellie Simmering DO   On: 11/25/2020 15:01               LOS: 1 day   Kathryn Hancock  [facility]   Pager on [email or web address]. If 7PM-7AM, please contact night-coverage at [email or web address]     11/26/2020, 2:11 PM

## 2020-11-26 NOTE — Progress Notes (Signed)
Initial Nutrition Assessment  DOCUMENTATION CODES:   Not applicable  INTERVENTION:   Nepro Shake po BID, each supplement provides 425 kcal and 19 grams protein  Vitamin C 500mg  po BID  Rena-vit po daily   Juven Fruit Punch BID each serving provides 95kcal and 2.5g of protein (amino acids glutamine and arginine)  Recommend liberalize diet   NUTRITION DIAGNOSIS:   Increased nutrient needs related to chronic illness (ESRD on HD, wound healing) as evidenced by estimated needs.  GOAL:   Patient will meet greater than or equal to 90% of their needs  MONITOR:   PO intake, Supplement acceptance, Labs, Weight trends, Skin, I & O's  REASON FOR ASSESSMENT:   Consult Assessment of nutrition requirement/status  ASSESSMENT:   70 y.o. female with medical history significant of PAD s/p left and right leg bypasses, chronic bilateral toe ischemia, non-healing wound of right thigh after right femoropopliteal bypass, ESRD on HD TTS (since January), PAF on Eliquis, IDDM, HTN, CAD status post CABG, HTN, AAA, HLD, depression and recent COVID 19 who presented with worsening of gangrene-like changes on bilateral toes and on healing wound of right thigh and unhealing ulcers of bilateral hips.  RD working remotely.  Unable to reach pt by phone after multiple attempts. There are no documented meal intakes in chart. Suspect pt with decreased oral intake at baseline. RD will add supplements and vitamins to help pt meet her estimated needs and to support wound healing. Recommend liberalize diet as a renal diet is restrictive. Per chart, pt appears weight stable at baseline. RD will obtain nutrition related history and exam at follow-up.   Medications reviewed and include: vitamin C, aspirin, pepcid, folic acid, heparin, insulin, renvela, cefepime, heparin, vancomycin   Labs reviewed: Na 134(L), K 4.3 wnl, BUN 28(H), creat 4.23(H) Wbc- 15.9(H), Hgb 8.1(L), Hct 26.7(L) Cbgs- 94, 130 x 24 hrs AIC  11.1(H)- 4/21  NUTRITION - FOCUSED PHYSICAL EXAM: Unable to perform at this time   Diet Order:   Diet Order             Diet renal/carb modified with fluid restriction Diet-HS Snack? Nothing; Fluid restriction: 1200 mL Fluid; Room service appropriate? Yes; Fluid consistency: Thin  Diet effective now                   EDUCATION NEEDS:   Education needs have been addressed  Skin:  Skin Assessment: Reviewed RN Assessment (Stage III thigh, gangrene toes, b/l hip wounds)  Last BM:  pta  Height:   Ht Readings from Last 1 Encounters:  11/25/20 5\' 3"  (1.6 m)    Weight:   Wt Readings from Last 1 Encounters:  11/26/20 61 kg    Ideal Body Weight:  52.3 kg  BMI:  Body mass index is 23.82 kg/m.  Estimated Nutritional Needs:   Kcal:  1700-1900kcal/day  Protein:  85-95g/day  Fluid:  UOP +1L  Koleen Distance MS, RD, LDN Please refer to [facility] for RD and/or RD on-call/weekend/after hours pager

## 2020-11-26 NOTE — Progress Notes (Signed)
ANTICOAGULATION CONSULT NOTE   Pharmacy Consult for heparin Indication: atrial fibrillation  Allergies  Allergen Reactions   Penicillins Anaphylaxis    Anaphylaxis as a child    Patient Measurements: Height: 5\' 3"  (160 cm) Weight: 61 kg (134 lb 7.7 oz) IBW/kg (Calculated) : 52.4 Heparin Dosing Weight: 62.1kg  Vital Signs: Temp: 98.6 F (37 C) (06/18 1640) Temp Source: Oral (06/18 1640) BP: 105/51 (06/18 1640) Pulse Rate: 73 (06/18 1640)  Labs: Recent Labs    11/25/20 1412 11/25/20 1512 11/26/20 0550 11/26/20 1620  HGB 10.3* 9.2* 8.1*  --   HCT 34.0* 27.0* 26.7*  --   PLT 374  --  247  --   APTT 45*  --  58* 69*  LABPROT 25.0*  --   --   --   INR 2.3*  --   --   --   HEPARINUNFRC  --   --  >1.10* >1.10*  CREATININE 3.97* 3.80* 4.23*  --      Estimated Creatinine Clearance: 10.7 mL/min (A) (by C-G formula based on SCr of 4.23 mg/dL (H)).   Medical History: Past Medical History:  Diagnosis Date   AAA (abdominal aortic aneurysm) ([place]) 06/2019   Multiple small pseudoaneurysmal projections of the dominant aorta.  Distal abdominal aortic aneurysm 4.5 x 4.7 cm.  Greatest AP dimension of the infrarenal aorta is 4.9.  No evidence of thoracic aortic aneurysm or dissection.  Brief segment of proximal IMA occlusion.   Anemia    Coronary artery disease involving native coronary artery of native heart without angina pectoris 06/01/2020   Cardiac cath at [facility] 06/01/2020-preop for atrial myxoma resection -> severe proximal RCA and PDA -> had single-vessel CABG with SVG-PDA along with myxoma resection.   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 = Status post resection December 23,2021 (done at [facility] because of no bed availability at [facility]   Microscopic hematuria    PAF (paroxysmal atrial fibrillation) ([place]) 07/01/2020   Initially noted postoperatively-left atrial myxoma resection and CABG x1.  Now on amiodarone and apixaban..   Peripheral vascular disease ([place])    Bilateral SFA & ATA occlusion.  2 V runoffvia Peroneal A & PTA. --> s/p L Fem-Pop (AK pop A) bypass (PFTE) 09/12/2020 - pending R Fem-AKPop bypass   Plantar wart of right foot    PONV (postoperative nausea and vomiting)     Medications:  Scheduled:   amiodarone  200 mg Oral Daily   [START ON 11/27/2020] vitamin C  500 mg Oral BID   aspirin EC  81 mg Oral Daily   atorvastatin  20 mg Oral Daily   Chlorhexidine Gluconate Cloth  6 each Topical Q0600   famotidine  20 mg Oral  Daily   [START ON 11/27/2020] feeding supplement (NEPRO CARB STEADY)  237 mL Oral BID BM   folic acid  1 mg Oral Daily   heparin sodium (porcine)       insulin aspart  0-6 Units Subcutaneous TID WC   linagliptin  5 mg Oral Daily   multivitamin  1 tablet Oral QHS   [START ON 11/27/2020] nutrition supplement (JUVEN)  1 packet Oral BID BM   sevelamer carbonate  800 mg Oral TID WC    Assessment: Patient with afib. On Eliquis PTA. Hgb stable 9-10, PLT 300s at baseline.   S/p rate increase to 800 units/hr aPTT is therapeutic and anti-Xa still elevated d/t Eliquis dose  Goal of Therapy:  APTT 66-102 Heparin level 0.3-0.7 units/ml Monitor platelets by anticoagulation protocol: Yes   Plan:   Continue heparin gtt at 800 units/hr Daily heparin level, aPTT until correlated, CBC F/u [facility] plan and ability to transition back to PO  Bertis Ruddy, PharmD Clinical Pharmacist ED Pharmacist Phone # [phone] 11/26/2020 5:37 PM

## 2020-11-26 NOTE — Consult Note (Signed)
[facility] Renal Consultation Note    Indication for Consultation:  Management of ESRD/hemodialysis, anemia, hypertension/volume, and secondary hyperparathyroidism. PCP:  HPI: Kathryn Hancock is a 70 y.o. female with ESRD, CAD s/p CABG and atrial myxoma resection, a-fib (on Eliquis), T2DM, HTN, HL, PAD (s/p L fem-pop bypass 09/2020, R fem-pop bypass 10/31/20) who has been admitted with worsened gangrene of all toes and worsened wounds to R thigh and sacral area.  Per notes, saw [facility] on 6/17 after being called by home health regarding new drainage to R thigh and B feet wounds. She was directed to the ED for admission with concern of wet gangrene and infected R thigh saphenectomy site. In the ED, she was slightly hypotensive. Labs with K 3.5, Ca 8.7, WBC 19, Hgb 10.3, LA 4.6 -> 2.1. Blood cultures collected. She was started on Vanc/Cefepime and admitted.  Dialyzes on TTS schedule at [facility] clinic - fairly new to dialysis, just started in 06/2020. She struggles with pain from sitting in chair for 4+ hours, but is compliant with her treatments.  Seen in room today - c/o pain in hips/sacral area/toes with movement, but none at rest. Denies CP or dyspnea. No fever or chills. Vomited once last night. but overall without N/V/D. She has significant LE edema.  Past Medical History:  Diagnosis Date   AAA (abdominal aortic aneurysm) ([place]) 06/2019   Multiple small pseudoaneurysmal projections of the dominant aorta.  Distal abdominal aortic aneurysm 4.5 x 4.7 cm.  Greatest AP dimension of the infrarenal aorta is 4.9.  No evidence of thoracic aortic aneurysm or dissection.  Brief segment of proximal IMA occlusion.   Anemia    Coronary artery disease involving native coronary artery of native heart without angina pectoris 06/01/2020   Cardiac cath at [facility] 06/01/2020-preop for atrial myxoma resection -> severe proximal RCA and PDA -> had single-vessel CABG with SVG-PDA along with myxoma resection.    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 05/27/2020   ESRD (end stage renal disease) on dialysis ([place]) 06/2020   Progression of CKDIIIb to ESRD initially related to thromboembolic event from left atrial myxoma; complicated by perioperative hypotension-; now 1 on HD TU/TH/SAT @ [facility]   GERD (gastroesophageal reflux disease)    Heavy smoker (more than 20 cigarettes per day)    ~ 2 ppd; since age 36 (29 pk yr) = has cut down to one half PPD.>   Hyperlipidemia associated with type 2 diabetes mellitus ([place]) 08/05/2020   Hypertension    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 = Status post resection December 23,2021 (done at [facility] because of no bed availability at [facility]   Microscopic hematuria    PAF (paroxysmal atrial fibrillation) ([place]) 07/01/2020   Initially noted postoperatively-left atrial myxoma resection and CABG x1.  Now on amiodarone and apixaban..   Peripheral vascular disease ([place])    Bilateral SFA & ATA occlusion.  2 V runoffvia Peroneal A & PTA. --> s/p L Fem-Pop (AK pop A) bypass (PFTE) 09/12/2020 - pending R Fem-AKPop bypass   Plantar wart of right foot    PONV (postoperative nausea and vomiting)    Past Surgical History:  Procedure Laterality Date   ABDOMINAL AORTOGRAM W/LOWER EXTREMITY N/A 07/08/2020   Procedure: ABDOMINAL AORTOGRAM W/LOWER EXTREMITY;  Surgeon: Angelia Mould, MD;  Location: [place] INVASIVE CV LAB:  2 R Renal & 1 L Renal A. Known Para-Ren  Aneurysm. Bilat Com, Internal & External Iliacs - CFA& DFA patent.  Bilat SFA 100% @ origin - recon @ AK Pop A.; Bilateral 2 V runnoff - Bilateral Peroneal A & PTA patent w/ Bilat ATA CTO.   AV FISTULA PLACEMENT Left 07/11/2020   Procedure: LEFT FIRST STAGE BRACHIOBASILIC UPPER EXTREMITY ARTERIOVENOUS (AV)  FISTULA CREATION;  Surgeon: [name], MD;  Location: [place];  Service: Vascular;  Laterality: Left;   BASCILIC VEIN TRANSPOSITION Left 10/03/2020   Procedure: BASILIC VEIN TRANSPOSITION SECOND STAGE LEFT;  Surgeon: Angelia Mould, MD;  Location: [place];  Service: Vascular;  Laterality: Left;   BIOPSY  10/01/2020   Procedure: BIOPSY;  Surgeon: Jackquline Denmark, MD;  Location: [facility] ENDOSCOPY;  Service: Endoscopy;;   Cardiac MRI  01/22/2020   Normal LV size and function.  Moderate focal basal septal hypertrophy.  No S.A.M.  LVEF 61%.  RVEF 66%.  Large mobile mass in the left atrium attached to the interatrial septum-does not appear to thrombus characteristics consistent with left atrial myxoma.   Chest CTA  06/2019   Multiple small pseudoaneurysmal projections of the dominant aorta.  Distal abdominal aortic aneurysm 4.5 x 4.7 cm.  Greatest AP dimension of the infrarenal aorta is 4.9.  No evidence of thoracic aortic aneurysm or dissection.  Brief segment of proximal IMA occlusion.  Large geographic filling defect of the left atrium with associated dense radiopaque material.  Aortic atherosclerosis and emphysema   COLONOSCOPY W/ POLYPECTOMY     CORONARY ARTERY BYPASS GRAFT  06/02/2020   @ [facility] - Dr. Norm Parcel; SVG-rPDA along with Atrial Myxoma Resection.   ESOPHAGOGASTRODUODENOSCOPY (EGD) WITH PROPOFOL N/A 10/01/2020   Procedure: ESOPHAGOGASTRODUODENOSCOPY (EGD) WITH PROPOFOL;  Surgeon: Jackquline Denmark, MD;  Location: [facility] ENDOSCOPY;  Service: Endoscopy;  Laterality: N/A;   EYE SURGERY Bilateral    Cataract surgery   FEMORAL-POPLITEAL BYPASS GRAFT Left 09/12/2020   Procedure: LEFT FEMORAL-ABOVE KNEE POPLITEAL ARTERY BYPASS GRAFT;  Surgeon: Angelia Mould, MD;  Location: [place];  Service: Vascular;  Laterality: Left;   FEMORAL-POPLITEAL BYPASS GRAFT Right 10/31/2020   Procedure: RIGHT BYPASS GRAFT FEMORAL- ABOVE KNEE POPLITEAL ARTERY;  Surgeon: Angelia Mould, MD;  Location: [place];   Service: Vascular;  Laterality: Right;   HOT HEMOSTASIS N/A 10/01/2020   Procedure: HOT HEMOSTASIS (ARGON PLASMA COAGULATION/BICAP);  Surgeon: Jackquline Denmark, MD;  Location: [facility] ENDOSCOPY;  Service: Endoscopy;  Laterality: N/A;   INTRAOPERATIVE TRANSESOPHAGEAL ECHOCARDIOGRAM  06/02/2020   [facility] (LA Myxoma Resection & CABG x 1): Pre-op EF> 55%. Normal WM. Mild AI. Large LA mass with calcifications &minimal vascularity (5.92 x 4 cm @ largest. LA dilation. Diffuse atherosclerosis of descending Ao, focal calcification of Sinus of  Valsalva. -> Post-op. S/p Mass Excision - no residual mass in LA. No ASD or PFO.  EF >55% no WMA.   IR FLUORO GUIDE CV LINE RIGHT  07/06/2020   IR US GUIDE VASC ACCESS RIGHT  07/06/2020   LEFT ATRIAL MYXOMA RESECTION Left 06/02/2020   [facility]: Dr. Norm Parcel   LEFT HEART CATH AND CORONARY ANGIOGRAPHY  05/2020   [facility]: Biplane Coronary Angiography, Significant Prox RCA & rPDA disease. Normal CO/CI with elevated LVEDP.   TRANSESOPHAGEAL ECHOCARDIOGRAM  05/31/2020   [facility]:  Normal LV function.  No LAA thrombus.  Large mobile left atrial mass attached to the left atrial septum (4.8 x 3.1 cm with calcification)-most consistent with atrial  myxoma.  EF estimated 55%.  Normal valves.   TRANSTHORACIC ECHOCARDIOGRAM  07/2019    EF 60 to 65%.  GR 1 DD.  Elevated LVEDP.  Large size ill-defined echodensity in the left atrium suspicious for thrombus versus tumor.  Moderately dilated LA.   TRANSTHORACIC ECHOCARDIOGRAM  08/31/2020   (Postop left atrial myxoma resection) normal LVEF of 55-60%.  No R WMA.  GR 1 DD.  Mildly reduced RV function with normal PAP.  Trivial AI.  Left atrial myxoma no longer present.  Mitral valve stable trivial MR.   TUBAL LIGATION     Family History  Problem Relation Age of Onset   Diabetes Mother    Diabetes Father    Heart attack Father 68   CAD Father    Hyperlipidemia Father    Hypertension Father    Social History:  reports that she has been smoking cigarettes.  She has a 25.00 pack-year smoking history. She has never used smokeless tobacco. She reports previous alcohol use. She reports that she does not use drugs.  ROS: As per HPI otherwise negative.  Physical Exam: Vitals:   11/25/20 1935 11/26/20 0040 11/26/20 0506 11/26/20 0858  BP: (!) 141/64 (!) 124/58 (!) 110/49 (!) 94/49  Pulse: 78 76 69 69  Resp: 20 18 18 18   Temp: 98.3 F (36.8 C) 98.2 F (36.8 C) 98.2 F (36.8 C) 98.3 F (36.8 C)  TempSrc: Oral Oral Oral Oral  SpO2: 94% 98% 97% 94%  Weight:      Height:         General: Ill appearing woman, NAD. Room air. Head: Normocephalic, atraumatic, sclera non-icteric, mucus membranes are moist. Neck: Supple without lymphadenopathy/masses. JVD not elevated. Lungs: Clear bilaterally to auscultation without wheezes, rales, or rhonchi. Breathing is unlabored. Heart: RRR with normal S1, S2. No murmurs, rubs, or gallops appreciated. Abdomen: Soft, non-tender, non-distended with normoactive bowel sounds.  Lower extremities: 4+ pitting BLE edema to knees. All toes with gangrene. Large, black eschared wounds to B hips/sacral area. R thigh wound bandaged. Neuro: Alert and oriented X 3. Moves all extremities spontaneously. Psych:  Responds to questions appropriately with a normal affect. Dialysis Access: TDC in R chest and AVF in LUE + bruit  Allergies  Allergen Reactions   Penicillins Anaphylaxis    Anaphylaxis as a child   Prior to Admission medications   Medication Sig Start Date End Date Taking? Authorizing Provider  acetaminophen (TYLENOL) 650 MG CR tablet Take 1,300 mg by mouth every 8 (eight) hours as needed for pain.   Yes [provider]  amiodarone (PACERONE) 200 MG tablet Take 200 mg by mouth every evening. 06/10/20 06/10/21 Yes [provider]  amLODipine (NORVASC) 5 MG tablet Take 5 mg by mouth every evening. 06/10/20 06/10/21 Yes [provider]  apixaban (ELIQUIS) 5 MG TABS tablet Take 1 tablet (5 mg  total) by mouth 2 (two) times daily. 12/10/19  Yes Leonie Man, MD  Ascorbic Acid (VITAMIN C) 1000 MG tablet Take 1,000 mg by mouth daily.   Yes [provider]  aspirin EC 81 MG tablet Take 81 mg by mouth every evening. Swallow whole.   Yes [provider]  atorvastatin (LIPITOR) 20 MG tablet Take 20 mg by mouth every evening.   Yes [provider]  AURYXIA 1 GM 210 MG(Fe) tablet Take 210 mg by mouth 2 (two) times daily. 10/25/20  Yes [provider]  carvedilol (COREG) 6.25 MG tablet TAKE 1 TABLET (6.25 MG  TOTAL) BY MOUTH TWO TIMES DAILY WITH A MEAL. Patient taking differently: Take 6.25 mg by mouth 2 (two) times daily with a meal. 07/15/20 07/15/21 Yes Pahwani, Einar Grad, MD  diphenhydramine-acetaminophen (TYLENOL PM) 25-500 MG TABS tablet Take 1 tablet by mouth at bedtime as needed (sleep).   Yes [provider]  docusate sodium (COLACE) 100 MG capsule Take 100 mg by mouth daily as needed for mild constipation.   Yes [provider]  famotidine (PEPCID) 20 MG tablet Take 20 mg by mouth daily.   Yes [provider]  fluticasone (FLONASE) 50 MCG/ACT nasal spray Place 2 sprays into both nostrils as needed for allergies or rhinitis.   Yes [provider]  folic acid (FOLVITE) 235 MCG tablet Take 800 mcg by mouth daily.   Yes [provider]  lidocaine-prilocaine (EMLA) cream Apply 1 application topically daily as needed (dialysis). 08/12/20  Yes [provider]  linagliptin (TRADJENTA) 5 MG TABS tablet Take 5 mg by mouth every evening.   Yes [provider]  loperamide (IMODIUM A-D) 2 MG tablet Take 2 mg by mouth 4 (four) times daily as needed for diarrhea or loose stools.   Yes [provider]  montelukast (SINGULAIR) 10 MG tablet Take 10 mg by mouth daily as needed (allergies).   Yes [provider]  neomycin-bacitracin-polymyxin (NEOSPORIN) ointment Apply 1 application topically as needed  for wound care.   Yes [provider]  oxyCODONE (OXY IR/ROXICODONE) 5 MG immediate release tablet Take 1 tablet (5 mg total) by mouth every 6 (six) hours as needed for moderate pain. 11/02/20  Yes Dagoberto Ligas, PA-C  Vitamin D3 (VITAMIN D) 25 MCG tablet Take 2,000 Units by mouth daily.   Yes [provider]  glucose blood test strip 1 each by Other route as needed. Use as instructed    [provider]   Current Facility-Administered Medications  Medication Dose Route Frequency Provider Last Rate Last Admin   ondansetron (ZOFRAN) 4 MG/2ML injection            diphenhydrAMINE (BENADRYL) capsule 25 mg  25 mg Oral QHS PRN Wynetta Fines T, MD       And   acetaminophen (TYLENOL) tablet 500 mg  500 mg Oral QHS PRN Wynetta Fines T, MD       acetaminophen (TYLENOL) tablet 650 mg  650 mg Oral Q8H PRN Wynetta Fines T, MD       amiodarone (PACERONE) tablet 200 mg  200 mg Oral Daily Wynetta Fines T, MD   200 mg at 11/26/20 5732   ascorbic acid (VITAMIN C) tablet 1,000 mg  1,000 mg Oral QPM Wynetta Fines T, MD   1,000 mg at 11/25/20 2202   aspirin EC tablet 81 mg  81 mg Oral Daily Wynetta Fines T, MD   81 mg at 11/26/20 0945   atorvastatin (LIPITOR) tablet 20 mg  20 mg Oral Daily Wynetta Fines T, MD   20 mg at 11/26/20 0946   bisacodyl (DULCOLAX) EC tablet 5 mg  5 mg Oral Daily PRN Wynetta Fines T, MD       ceFEPIme (MAXIPIME) 1 g in sodium chloride 0.9 % 100 mL IVPB  1 g Intravenous Q24H Cala Bradford, [facility]       Chlorhexidine Gluconate Cloth 2 % PADS 6 each  6 each Topical Q0600 Loren Racer, PA-C   6 each at 11/26/20 0943   cholecalciferol (VITAMIN D3) tablet 2,000 Units  2,000 Units Oral QPM  Lequita Halt, MD   2,000 Units at 11/25/20 2203   diclofenac Sodium (VOLTAREN) 1 % topical gel 1 application  1 application Topical QID PRN Wynetta Fines T, MD       docusate sodium (COLACE) capsule 100 mg  100 mg Oral Daily PRN Wynetta Fines T, MD       famotidine (PEPCID) tablet 20 mg  20 mg Oral  Daily Wynetta Fines T, MD   20 mg at 11/26/20 0946   ferric citrate (AURYXIA) tablet 210 mg  210 mg Oral BID Wynetta Fines T, MD   210 mg at 11/26/20 0945   fluticasone (FLONASE) 50 MCG/ACT nasal spray 2 spray  2 spray Each Nare PRN Wynetta Fines T, MD       folic acid (FOLVITE) tablet 1 mg  1 mg Oral Daily Wynetta Fines T, MD   1 mg at 11/26/20 0945   heparin ADULT infusion 100 units/mL (25000 units/24mL)  800 Units/hr Intravenous Continuous Erenest Blank, RPH 8 mL/hr at 11/26/20 0943 800 Units/hr at 11/26/20 0943   heparin sodium (porcine) 1000 UNIT/ML injection            HYDROmorphone (DILAUDID) 1 MG/ML injection            HYDROmorphone (DILAUDID) injection 0.5-1 mg  0.5-1 mg Intravenous Q2H PRN Wynetta Fines T, MD   1 mg at 11/26/20 1128   insulin aspart (novoLOG) injection 0-6 Units  0-6 Units Subcutaneous TID WC Wynetta Fines T, MD       linagliptin (TRADJENTA) tablet 5 mg  5 mg Oral Daily Wynetta Fines T, MD   5 mg at 11/26/20 0946   montelukast (SINGULAIR) tablet 10 mg  10 mg Oral Daily PRN Wynetta Fines T, MD   10 mg at 11/26/20 0947   ondansetron (ZOFRAN) injection 4 mg  4 mg Intravenous Q6H PRN Opyd, Ilene Qua, MD   4 mg at 11/26/20 0043   oxyCODONE (Oxy IR/ROXICODONE) immediate release tablet 5 mg  5 mg Oral Q6H PRN Wynetta Fines T, MD   5 mg at 11/26/20 0442   vancomycin (VANCOREADY) 750 MG/150ML IVPB            vancomycin (VANCOREADY) 750 MG/150ML IVPB            vancomycin (VANCOREADY) IVPB 750 mg/150 mL  750 mg Intravenous Q T,Th,Sa-HD Cala Bradford, [facility]       Labs: Basic Metabolic Panel: Recent Labs  Lab 11/25/20 1412 11/25/20 1512 11/26/20 0550  NA 134* 135 134*  K 3.5 3.3* 4.3  CL 97* 100 100  CO2 22  --  21*  GLUCOSE 153* 112* 90  BUN 22 26* 28*  CREATININE 3.97* 3.80* 4.23*  CALCIUM 8.7*  --  8.2*   Liver Function Tests: Recent Labs  Lab 11/25/20 1412  AST 24  ALT 14  ALKPHOS 209*  BILITOT 0.6  PROT 6.1*  ALBUMIN 1.9*   CBC: Recent Labs  Lab 11/25/20 1412  11/25/20 1512 11/26/20 0550  WBC 19.0*  --  15.9*  NEUTROABS 14.3*  --   --   HGB 10.3* 9.2* 8.1*  HCT 34.0* 27.0* 26.7*  MCV 106.9*  --  105.1*  PLT 374  --  247   Studies/Results: DG Chest Port 1 View  Result Date: 11/25/2020 CLINICAL DATA:  Questionable sepsis, evaluate for abnormality. EXAM: PORTABLE CHEST 1 VIEW COMPARISON:  Chest radiographs 09/29/2020 and earlier. FINDINGS: Unchanged position of a right-sided dialysis catheter with tip projecting at  the level of the superior cavoatrial junction/upper right atrium. Prior median sternotomy/CABG. Heart size within normal limits. Aortic atherosclerosis. No appreciable airspace consolidation or pulmonary edema. No evidence of pleural effusion or pneumothorax. No acute bony abnormality identified. IMPRESSION: No evidence of acute cardiopulmonary abnormality. Aortic Atherosclerosis (ICD10-I70.0). Electronically Signed   By: Kellie Simmering DO   On: 11/25/2020 15:01    Dialysis Orders:  TTS at [facility] 3:45hr, 250/500 (new cannulation protocol AVF), EDW 59.5kg, 3K/2.5Ca, AVF + TDC, no heparin - Venofer 100mg  x 5 ordered - Mircera 162mcg IV q 2 weeks (last 6/14)  Assessment/Plan:  Extensive gangrenous wounds (all toes, R thigh, R hip, entire sacrum, L hip): On Vanc/Cefepime. VVS surgical plan pending, B TMA and other wound debridement? From a renal eye - the hip/sacral wounds look like calciphylaxis. Discussed with patient in detail today. Plan to treat empirically with sodium thiosulfate and discontinue all Ca/Vit D/iron products.  ESRD:  Continue HD per TTS schedule - HD today. She has significant LE edema - will try to work on this.  Hypertension/volume: HyPOtensive on admit - home meds on hold. UF as tolerated.  Anemia: Hgb 8.1 - not due for ESA yet. 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  Metabolic bone disease: CorrCa ok, Phos pending. D/c Vit D3 and changed binder from Auryxia -> Renvela. Using 2Ca bath with dialysis.   Nutrition:  Alb very low, adding supplements.  CAD  PAD  T2DM  A-fib on Eliquis: Heparin bridge for upcoming surgery.  GOC: Attempted goals of care discussion today given severity of her wounds and low change of healing/overall FTT situation. She believes that God will heal her and says that she will do whatever to get better. Will continue to keep open dialogue with her and see how things progress.  Veneta Penton, PA-C 11/26/2020, 11:51 AM  [employer]

## 2020-11-26 NOTE — Procedures (Signed)
No note

## 2020-11-27 LAB — BASIC METABOLIC PANEL
Anion gap: 14 (ref 5–15)
BUN: 11 mg/dL (ref 8–23)
CO2: 26 mmol/L (ref 22–32)
Calcium: 7.6 mg/dL — ABNORMAL LOW (ref 8.9–10.3)
Chloride: 97 mmol/L — ABNORMAL LOW (ref 98–111)
Creatinine, Ser: 2.68 mg/dL — ABNORMAL HIGH (ref 0.44–1.00)
GFR, Estimated: 19 mL/min — ABNORMAL LOW (ref >60)
Glucose, Bld: 111 mg/dL — ABNORMAL HIGH (ref 70–99)
Potassium: 3.7 mmol/L (ref 3.5–5.1)
Sodium: 137 mmol/L (ref 135–145)

## 2020-11-27 LAB — APTT
aPTT: 51 seconds — ABNORMAL HIGH (ref 24–36)
aPTT: 76 seconds — ABNORMAL HIGH (ref 24–36)

## 2020-11-27 LAB — CBC WITH DIFFERENTIAL/PLATELET
Abs Immature Granulocytes: 0.81 10*3/uL — ABNORMAL HIGH (ref 0.00–0.07)
Basophils Absolute: 0.1 10*3/uL (ref 0.0–0.1)
Basophils Relative: 0 %
Eosinophils Absolute: 0.2 10*3/uL (ref 0.0–0.5)
Eosinophils Relative: 1 %
HCT: 27.7 % — ABNORMAL LOW (ref 36.0–46.0)
Hemoglobin: 8.4 g/dL — ABNORMAL LOW (ref 12.0–15.0)
Immature Granulocytes: 5 %
Lymphocytes Relative: 12 %
Lymphs Abs: 2 10*3/uL (ref 0.7–4.0)
MCH: 32.4 pg (ref 26.0–34.0)
MCHC: 30.3 g/dL (ref 30.0–36.0)
MCV: 106.9 fL — ABNORMAL HIGH (ref 80.0–100.0)
Monocytes Absolute: 0.9 10*3/uL (ref 0.1–1.0)
Monocytes Relative: 6 %
Neutro Abs: 12.3 10*3/uL — ABNORMAL HIGH (ref 1.7–7.7)
Neutrophils Relative %: 76 %
Platelets: 228 10*3/uL (ref 150–400)
RBC: 2.59 MIL/uL — ABNORMAL LOW (ref 3.87–5.11)
RDW: 21.6 % — ABNORMAL HIGH (ref 11.5–15.5)
WBC: 16.3 10*3/uL — ABNORMAL HIGH (ref 4.0–10.5)
nRBC: 0 % (ref 0.0–0.2)

## 2020-11-27 LAB — LACTIC ACID, PLASMA: Lactic Acid, Venous: 2.4 mmol/L (ref 0.5–1.9)

## 2020-11-27 LAB — GLUCOSE, CAPILLARY
Glucose-Capillary: 116 mg/dL — ABNORMAL HIGH (ref 70–99)
Glucose-Capillary: 152 mg/dL — ABNORMAL HIGH (ref 70–99)
Glucose-Capillary: 81 mg/dL (ref 70–99)
Glucose-Capillary: 143 mg/dL — ABNORMAL HIGH (ref 70–99)

## 2020-11-27 LAB — HEPARIN LEVEL (UNFRACTIONATED): Heparin Unfractionated: >1.1 IU/mL — ABNORMAL HIGH (ref 0.30–0.70)

## 2020-11-27 MED ORDER — PROSOURCE PLUS PO LIQD
30.0000 mL | Freq: Two times a day (BID) | ORAL | Status: DC
  Administered 2020-11-27: 30 mL via ORAL
  Filled 2020-11-27 (×2): qty 30

## 2020-11-27 NOTE — Consult Note (Addendum)
Hospital Consult    Reason for Consult:  incision infection Requesting Physician:  [facility] Dr. Mal Misty MRN #:  [id number]  History of Present Illness: This is a 70 y.o. female with a complex medical history which includes infra inguinal lower extremity atherosclerosis disease.  She was seen in the office on Friday with complaints of drainage from her right thigh incision.  She is status post right femoral to above-knee popliteal artery bypass with greater saphenous vein by Dr. Scot Dock on Oct 31, 2020.  To be hypotensive and was instructed to proceed to the emergency department at [facility]  She was admitted, placed on antibiotics and is seen and evaluated in the progressive care unit.  She is currently comfortable after medical team examined wounds of bilateral hips.  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  Her daughter is at bedside.  She currently denies fever or chills.  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  She developed atrial fibrillation and is currently maintained on apixaban.    She presented to the [facility] emergency department July 01, 2020 with ischemic changes to all ten toes.  She was seen in consultation and infrainguinal arterial occlusive disease was suspected and ABIs were performed.  She underwent left femoral to above-knee popliteal artery bypass by Dr. Scot Dock on September 12, 2020.  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  She is recently status post left upper arm basilic vein transposition.  She tells me today they have accessed her fistula twice using one needle and one needle to her TDC.  She also has history of pararenal abdominal aortic aneurysm followed by Dr. Doren Custard as an outpatient.  Per his assessment should this enlarge she would require a fenestrated graft versus a thoracoabdominal repair  and would have to be referred to [facility].  The pt is on a statin for cholesterol management.  The pt is on a daily aspirin.   Other AC:  apixaban The pt is on CCB, BB for hypertension.   The pt is diabetic.  Oral meds Tobacco hx:  54 pyh  Past Medical History:  Diagnosis Date   AAA (abdominal aortic aneurysm) ([place]) 06/2019   Multiple small pseudoaneurysmal projections of the dominant aorta.  Distal abdominal aortic aneurysm 4.5 x 4.7 cm.  Greatest AP dimension of the infrarenal aorta is 4.9.  No evidence of thoracic aortic aneurysm or dissection.  Brief segment of proximal IMA occlusion.   Anemia    Coronary artery disease involving native coronary artery of native heart without angina pectoris 06/01/2020   Cardiac cath at [facility] 06/01/2020-preop for atrial myxoma resection -> severe proximal RCA and PDA -> had single-vessel CABG with SVG-PDA along with myxoma resection.   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 05/27/2020   ESRD (end stage renal disease) on dialysis ([place]) 06/2020   Progression of CKDIIIb to ESRD initially related to thromboembolic event from left atrial myxoma; complicated by perioperative hypotension-; now 1 on HD TU/TH/SAT @ [facility]   GERD (gastroesophageal reflux disease)    Heavy smoker (more than 20 cigarettes per day)    ~ 2 ppd; since age 79 (82 pk yr) = has cut down to one half PPD.>   Hyperlipidemia associated with type 2  diabetes mellitus ([place]) 08/05/2020   Hypertension    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 = Status post resection December 23,2021 (done at [facility] because of no bed availability at [facility]   Microscopic hematuria    PAF (paroxysmal atrial fibrillation) ([place]) 07/01/2020   Initially noted postoperatively-left atrial myxoma  resection and CABG x1.  Now on amiodarone and apixaban..   Peripheral vascular disease ([place])    Bilateral SFA & ATA occlusion.  2 V runoffvia Peroneal A & PTA. --> s/p L Fem-Pop (AK pop A) bypass (PFTE) 09/12/2020 - pending R Fem-AKPop bypass   Plantar wart of right foot    PONV (postoperative nausea and vomiting)     Past Surgical History:  Procedure Laterality Date   ABDOMINAL AORTOGRAM W/LOWER EXTREMITY N/A 07/08/2020   Procedure: ABDOMINAL AORTOGRAM W/LOWER EXTREMITY;  Surgeon: Angelia Mould, MD;  Location: [place] CV LAB:  2 R Renal & 1 L Renal A. Known Para-Ren  Aneurysm. Bilat Com, Internal & External Iliacs - CFA& DFA patent.  Bilat SFA 100% @ origin - recon @ AK Pop A.; Bilateral 2 V runnoff - Bilateral Peroneal A & PTA patent w/ Bilat ATA CTO.   AV FISTULA PLACEMENT Left 07/11/2020   Procedure: LEFT FIRST STAGE BRACHIOBASILIC UPPER EXTREMITY ARTERIOVENOUS (AV) FISTULA CREATION;  Surgeon: [name], MD;  Location: [place];  Service: Vascular;  Laterality: Left;   BASCILIC VEIN TRANSPOSITION Left 10/03/2020   Procedure: BASILIC VEIN TRANSPOSITION SECOND STAGE LEFT;  Surgeon: Angelia Mould, MD;  Location: [place];  Service: Vascular;  Laterality: Left;   BIOPSY  10/01/2020   Procedure: BIOPSY;  Surgeon: Jackquline Denmark, MD;  Location: [facility] ENDOSCOPY;  Service: Endoscopy;;   Cardiac MRI  01/22/2020   Normal LV size and function.  Moderate focal basal septal hypertrophy.  No S.A.M.  LVEF 61%.  RVEF 66%.  Large mobile mass in the left atrium attached to the interatrial septum-does not appear to thrombus characteristics consistent with left atrial myxoma.   Chest CTA  06/2019   Multiple small pseudoaneurysmal projections of the dominant aorta.  Distal abdominal aortic aneurysm 4.5 x 4.7 cm.  Greatest AP dimension of the infrarenal aorta is 4.9.  No evidence of thoracic aortic aneurysm or dissection.  Brief segment of proximal IMA occlusion.  Large geographic filling  defect of the left atrium with associated dense radiopaque material.  Aortic atherosclerosis and emphysema   COLONOSCOPY W/ POLYPECTOMY     CORONARY ARTERY BYPASS GRAFT  06/02/2020   @ [facility] - Dr. Norm Parcel; SVG-rPDA along with Atrial Myxoma Resection.   ESOPHAGOGASTRODUODENOSCOPY (EGD) WITH PROPOFOL N/A 10/01/2020   Procedure: ESOPHAGOGASTRODUODENOSCOPY (EGD) WITH PROPOFOL;  Surgeon: Jackquline Denmark, MD;  Location: [facility] ENDOSCOPY;  Service: Endoscopy;  Laterality: N/A;   EYE SURGERY Bilateral    Cataract surgery   FEMORAL-POPLITEAL BYPASS GRAFT Left 09/12/2020   Procedure: LEFT FEMORAL-ABOVE KNEE POPLITEAL ARTERY BYPASS GRAFT;  Surgeon: Angelia Mould, MD;  Location: [place];  Service: Vascular;  Laterality: Left;   FEMORAL-POPLITEAL BYPASS GRAFT Right 10/31/2020   Procedure: RIGHT BYPASS GRAFT FEMORAL- ABOVE KNEE POPLITEAL ARTERY;  Surgeon: Angelia Mould, MD;  Location: [place];  Service: Vascular;  Laterality: Right;   HOT HEMOSTASIS N/A 10/01/2020   Procedure: HOT HEMOSTASIS (ARGON PLASMA COAGULATION/BICAP);  Surgeon: Jackquline Denmark, MD;  Location: [facility] ENDOSCOPY;  Service: Endoscopy;  Laterality: N/A;   INTRAOPERATIVE TRANSESOPHAGEAL ECHOCARDIOGRAM  06/02/2020  [facility] (LA Myxoma Resection & CABG x 1): Pre-op EF> 55%. Normal WM. Mild AI. Large LA mass with calcifications &minimal vascularity (5.92 x 4 cm @ largest. LA dilation. Diffuse atherosclerosis of descending Ao, focal calcification of Sinus of  Valsalva. -> Post-op. S/p Mass Excision - no residual mass in LA. No ASD or PFO.  EF >55% no WMA.   IR FLUORO GUIDE CV LINE RIGHT  07/06/2020   IR US GUIDE VASC ACCESS RIGHT  07/06/2020   LEFT ATRIAL MYXOMA RESECTION Left 06/02/2020   [facility]: Dr. Norm Parcel   LEFT HEART CATH AND CORONARY ANGIOGRAPHY  05/2020   [facility]: Biplane Coronary Angiography, Significant Prox RCA & rPDA disease. Normal CO/CI with elevated LVEDP.   TRANSESOPHAGEAL ECHOCARDIOGRAM  05/31/2020   [facility]:  Normal LV function.  No LAA  thrombus.  Large mobile left atrial mass attached to the left atrial septum (4.8 x 3.1 cm with calcification)-most consistent with atrial myxoma.  EF estimated 55%.  Normal valves.   TRANSTHORACIC ECHOCARDIOGRAM  07/2019    EF 60 to 65%.  GR 1 DD.  Elevated LVEDP.  Large size ill-defined echodensity in the left atrium suspicious for thrombus versus tumor.  Moderately dilated LA.   TRANSTHORACIC ECHOCARDIOGRAM  08/31/2020   (Postop left atrial myxoma resection) normal LVEF of 55-60%.  No R WMA.  GR 1 DD.  Mildly reduced RV function with normal PAP.  Trivial AI.  Left atrial myxoma no longer present.  Mitral valve stable trivial MR.   TUBAL LIGATION      Allergies  Allergen Reactions   Penicillins Anaphylaxis    Anaphylaxis as a child    Prior to Admission medications   Medication Sig Start Date End Date Taking? Authorizing Provider  acetaminophen (TYLENOL) 650 MG CR tablet Take 1,300 mg by mouth every 8 (eight) hours as needed for pain.   Yes [provider]  amiodarone (PACERONE) 200 MG tablet Take 200 mg by mouth every evening. 06/10/20 06/10/21 Yes [provider]  amLODipine (NORVASC) 5 MG tablet Take 5 mg by mouth every evening. 06/10/20 06/10/21 Yes [provider]  apixaban (ELIQUIS) 5 MG TABS tablet Take 1 tablet (5 mg total) by mouth 2 (two) times daily. 12/10/19  Yes Leonie Man, MD  Ascorbic Acid (VITAMIN C) 1000 MG tablet Take 1,000 mg by mouth daily.   Yes [provider]  aspirin EC 81 MG tablet Take 81 mg by mouth every evening. Swallow whole.   Yes [provider]  atorvastatin (LIPITOR) 20 MG tablet Take 20 mg by mouth every evening.   Yes [provider]  AURYXIA 1 GM 210 MG(Fe) tablet Take 210 mg by mouth 2 (two) times daily. 10/25/20  Yes [provider]  carvedilol (COREG) 6.25 MG tablet TAKE 1 TABLET (6.25 MG TOTAL) BY MOUTH TWO TIMES DAILY WITH A MEAL. Patient taking differently: Take 6.25 mg by mouth 2  (two) times daily with a meal. 07/15/20 07/15/21 Yes Pahwani, Einar Grad, MD  diphenhydramine-acetaminophen (TYLENOL PM) 25-500 MG TABS tablet Take 1 tablet by mouth at bedtime as needed (sleep).   Yes [provider]  docusate sodium (COLACE) 100 MG capsule Take 100 mg by mouth daily as needed for mild constipation.   Yes [provider]  famotidine (PEPCID) 20 MG tablet Take 20 mg by mouth daily.   Yes [provider]  fluticasone (FLONASE) 50 MCG/ACT nasal spray Place 2 sprays into both nostrils as needed for allergies or rhinitis.   Yes  [provider]  folic acid (FOLVITE) 161 MCG tablet Take 800 mcg by mouth daily.   Yes [provider]  lidocaine-prilocaine (EMLA) cream Apply 1 application topically daily as needed (dialysis). 08/12/20  Yes [provider]  linagliptin (TRADJENTA) 5 MG TABS tablet Take 5 mg by mouth every evening.   Yes [provider]  loperamide (IMODIUM A-D) 2 MG tablet Take 2 mg by mouth 4 (four) times daily as needed for diarrhea or loose stools.   Yes [provider]  montelukast (SINGULAIR) 10 MG tablet Take 10 mg by mouth daily as needed (allergies).   Yes [provider]  neomycin-bacitracin-polymyxin (NEOSPORIN) ointment Apply 1 application topically as needed for wound care.   Yes [provider]  oxyCODONE (OXY IR/ROXICODONE) 5 MG immediate release tablet Take 1 tablet (5 mg total) by mouth every 6 (six) hours as needed for moderate pain. 11/02/20  Yes Dagoberto Ligas, PA-C  Vitamin D3 (VITAMIN D) 25 MCG tablet Take 2,000 Units by mouth daily.   Yes [provider]  glucose blood test strip 1 each by Other route as needed. Use as instructed    [provider]    Social History   Socioeconomic History   Marital status: Widowed    Spouse name: Not on file   Number of children: Not on file   Years of education: Not on file   Highest education level: Not on file   Occupational History   Not on file  Tobacco Use   Smoking status: Every Day    Packs/day: 0.50    Years: 50.00    Pack years: 25.00    Types: Cigarettes   Smokeless tobacco: Never  Vaping Use   Vaping Use: Never used  Substance and Sexual Activity   Alcohol use: Not Currently   Drug use: Never   Sexual activity: Not Currently  Other Topics Concern   Not on file  Social History Narrative   She is a mother of 2-but her son died in 2019/10/05 of a drug overdose.   Denies alcohol use.      Patient is widowed and lives with her daughter and several grandchildren locally in [place].     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     At baseline, she lives a sedentary lifestyle and does not exercise or get out of the house much at all.  She is limited by bilateral calf claudication.     She also experiences some exertional shortness of breath which is chronic and stable.       She smokes between 1 and 2 packs of cigarettes daily and has been smoking since she was age 30.     Social Determinants of Health   Financial Resource Strain: Not on file  Food Insecurity: Not on file  Transportation Needs: Not on file  Physical Activity: Not on file  Stress: Not on file  Social Connections: Not on file  Intimate Partner Violence: Not on file     Family History  Problem Relation Age of Onset   Diabetes Mother    Diabetes Father    Heart attack Father 35   CAD Father    Hyperlipidemia Father    Hypertension Father     ROS: [x]  Positive   [ ]  Negative   [ ]  All sytems reviewed and are negative  Cardiac: []  chest pain/pressure []  palpitations []  SOB lying flat []  DOE  Vascular: []  pain in legs while walking []  pain in legs at rest []  pain in legs at night [x]  non-healing ulcers []  hx of DVT []  swelling in legs  Pulmonary: []  productive cough []  asthma/wheezing []  home O2  Neurologic: []  weakness in []  arms []  legs []   numbness in []  arms []  legs []  hx of CVA []  mini stroke [] difficulty speaking or slurred speech []  temporary loss of vision in one eye []  dizziness  Hematologic: []  hx of cancer []  bleeding problems []  problems with blood clotting easily  Endocrine:   [x]  diabetes []  thyroid disease  GI []  vomiting blood []  blood in stool  GU: [x]  CKD/renal failure [x]  HD--[]  M/W/F or []  T/T/S []  burning with urination []  blood in urine  Psychiatric: []  anxiety []  depression  Musculoskeletal: []  arthritis []  joint pain  Integumentary: []  rashes []  ulcers  Constitutional: []  fever []  chills   Physical Examination  Vitals:   11/27/20 0314 11/27/20 0900  BP: (!) 102/49 (!) 105/50  Pulse: 70 69  Resp: 18 17  Temp: 98.4 F (36.9 C) 98.3 F (36.8 C)  SpO2: 100% 97%   Body mass index is 23.82 kg/m.  General:  WDWN in NAD Gait: Not observed HENT: WNL, normocephalic Pulmonary: normal non-labored breathing, without Rales, rhonchi,  wheezing Cardiac: regular rhythm and rate  Abdomen:  soft, NT/ND Skin: without rashes; ecchymotic areas of upper extremities noted Vascular Exam/Pulses: 2+ palpable left DP pulse, Brisk biphasic PT and DP signals on right Extremities: with ischemic changes of all ten toes, with dry Gangrene , without cellulitis; with open wounds>> right thigh saphenectomy incision is open with purulent drainage. There is eschar and fibrinous exudate noted of above knee incision Musculoskeletal: + muscle wasting/atrophy  Neurologic: A&O X 3;  No focal weakness or paresthesias are detected; speech is fluent/normal Psychiatric:  The pt has Normal affect.    CBC    Component Value Date/Time   WBC 16.3 (H) 11/27/2020 0202   RBC 2.59 (L) 11/27/2020 0202   HGB 8.4 (L) 11/27/2020 0202   HGB 9.8 (L) 05/26/2020 1158   HCT 27.7 (L) 11/27/2020 0202   HCT 29.6 (L) 05/26/2020 1158   PLT 228 11/27/2020 0202   PLT 286 05/26/2020 1158   MCV 106.9 (H) 11/27/2020 0202    MCV 100 (H) 05/26/2020 1158   MCH 32.4 11/27/2020 0202   MCHC 30.3 11/27/2020 0202   RDW 21.6 (H) 11/27/2020 0202   RDW 14.1 05/26/2020 1158   LYMPHSABS 2.0 11/27/2020 0202   MONOABS 0.9 11/27/2020 0202   EOSABS 0.2 11/27/2020 0202   BASOSABS 0.1 11/27/2020 0202    BMET    Component Value Date/Time   NA 137 11/27/2020 0202   NA 139 05/26/2020 1158   K 3.7 11/27/2020 0202   CL 97 (L) 11/27/2020 0202   CO2 26 11/27/2020 0202   GLUCOSE 111 (H) 11/27/2020 0202   BUN 11 11/27/2020 0202   BUN 75 (H) 05/26/2020 1158   CREATININE 2.68 (H) 11/27/2020 0202   CALCIUM 7.6 (L) 11/27/2020 0202   GFRNONAA 19 (L) 11/27/2020 0202   GFRAA 16 (L) 05/26/2020 1158    COAGS: Lab Results  Component Value Date   INR 2.3 (H) 11/25/2020   INR 1.9 (H) 10/26/2020   INR 1.3 (H) 09/30/2020      ASSESSMENT/PLAN: This is a 70 y.o. 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  She presents with dehiscence of her right thigh  saphenectomy incision and poor healing of the distal thigh incision.  She has undergone bilateral lower extremity bypass in the recent months.  Her lower extremities are well perfused.    Continue normal saline wet-to-dry packing to wound.  Continue antibiotics.  We will plan debridement of her incisions tomorrow in the operating room.  NPO after midnight. Recheck INR. She is currently on heparin infusion secondary to atrial fibrillation.  This will be held preoperatively/on-call to the OR.  She had embolic event with history of atrial myxoma in December 2021 and developed ischemic changes to all 10 toes.  She currently has dry gangrene and this will be monitored.  On-call vascular surgeon, Dr. Trula Slade will evaluate the patient today.  Risa Grill, PA-C [facility] [phone]   I agree with the above.  I have seen and evaluated the patient.  She was seen by Dr. Donzetta Matters in the office on Friday.  She was sent to the ER for failure to  thrive and drainage of her saphenectomy site and above-knee incision.  I was notified of her admission this morning.  I spoke with the patient and her daughter.  I have recommended surgical debridement of her right leg incisions.  We will also need to consider digital amputation.  I discussed this with the patient.  We will at least plan for debridement of her saphenectomy and incisional sites tomorrow.  She will be n.p.o. after midnight  Wells Taylor Spilde

## 2020-11-27 NOTE — Progress Notes (Signed)
PROGRESS NOTE    Kathryn Hancock  IFO:[id number] DOB: 1951/03/26 DOA: 11/25/2020 PCP: Gala Lewandowsky, MD     Brief Narrative:  Kathryn Hancock is a 70 y.o. female with medical history significant of PAD with status post left and right leg bypasses, chronic bilateral toes ischemia, unhealing wound of right thigh after right femoropopliteal bypass, ESRD on HD TTS, PAF on Eliquis, IDDM, HTN, CAD status post CABG, HTN, HLD, presented with worsening of gangrene-like changes on bilateral toes and on healing wound of right thigh, and unhealing ulcers of bilateral hips.  New events last 24 hours / Subjective: Having some pain especially with positional changes due to her wounds on her sacrum, buttocks, right lower extremity as well as ischemic toes.  Has had some chronic nausea which is unchanged.  Assessment & Plan:   Active Problems:   ESRD (end stage renal disease) on dialysis ([place])   Pressure injury of skin   Limb ischemia   Nonhealing ulcer of the right medial thigh at the site of saphenectomy incision -Sepsis ruled out, afebrile, heart rate and respiratory rate within normal range on admission although she did have leukocytosis as well as lactic acidosis -Blood cultures negative to date -Continue vancomycin, cefepime -Vascular surgery consulted, planning for debridement tomorrow 6/20 -Continue IV heparin (Eliquis PTA)  Calciphylaxis -Appreciate nephrology, sodium thiosulfate with dialysis and local wound care  PAD with bilateral gangrenous all 10 toes -Vascular surgery consulted -Continue aspirin, Lipitor -Possible amputation this hospitalization  ESRD -HD TTS  Paroxysmal atrial fibrillation -Eliquis on hold, continue IV heparin -Continue amiodarone  Diabetes mellitus -Sliding scale insulin, Tradjenta     In agreement with assessment of the pressure ulcer as below:  Pressure Injury 10/31/20 Ischial tuberosity Right Unstageable - Full thickness tissue loss in which the base  of the injury is covered by slough (yellow, tan, gray, green or brown) and/or eschar (tan, brown or black) in the wound bed. 4.5 cm x 1.5 cm esch (Active)  10/31/20 1500  Location: Ischial tuberosity  Location Orientation: Right  Staging: Unstageable - Full thickness tissue loss in which the base of the injury is covered by slough (yellow, tan, gray, green or brown) and/or eschar (tan, brown or black) in the wound bed.  Wound Description (Comments): 4.5 cm x 1.5 cm eschar wound bed  Present on Admission: Yes     Pressure Injury Coccyx Medial Stage 3 -  Full thickness tissue loss. Subcutaneous fat may be visible but bone, tendon or muscle are NOT exposed. 2 cm x 1 cm shallow bed with yellow/white (Active)     Location: Coccyx  Location Orientation: Medial  Staging: Stage 3 -  Full thickness tissue loss. Subcutaneous fat may be visible but bone, tendon or muscle are NOT exposed.  Wound Description (Comments): 2 cm x 1 cm shallow bed with yellow/white  Present on Admission: Yes     Pressure Injury 10/31/20 Buttocks Right;Upper Unstageable - Full thickness tissue loss in which the base of the injury is covered by slough (yellow, tan, gray, green or brown) and/or eschar (tan, brown or black) in the wound bed. 2 cm x 1.5 cm eschar (Active)  10/31/20 1500  Location: Buttocks  Location Orientation: Right;Upper  Staging: Unstageable - Full thickness tissue loss in which the base of the injury is covered by slough (yellow, tan, gray, green or brown) and/or eschar (tan, brown or black) in the wound bed.  Wound Description (Comments): 2 cm x 1.5 cm eschar  Present on Admission: Yes  Pressure Injury 10/31/20 Buttocks Right;Lower Unstageable - Full thickness tissue loss in which the base of the injury is covered by slough (yellow, tan, gray, green or brown) and/or eschar (tan, brown or black) in the wound bed. 1 cm x 1 cm eschar (Active)  10/31/20 1500  Location: Buttocks  Location Orientation:  Right;Lower  Staging: Unstageable - Full thickness tissue loss in which the base of the injury is covered by slough (yellow, tan, gray, green or brown) and/or eschar (tan, brown or black) in the wound bed.  Wound Description (Comments): 1 cm x 1 cm eschar  Present on Admission: Yes     Pressure Injury 10/31/20 Ischial tuberosity Left Unstageable - Full thickness tissue loss in which the base of the injury is covered by slough (yellow, tan, gray, green or brown) and/or eschar (tan, brown or black) in the wound bed. 2.5 cm x 1 cm eschar (Active)  10/31/20 1500  Location: Ischial tuberosity  Location Orientation: Left  Staging: Unstageable - Full thickness tissue loss in which the base of the injury is covered by slough (yellow, tan, gray, green or brown) and/or eschar (tan, brown or black) in the wound bed.  Wound Description (Comments): 2.5 cm x 1 cm eschar  Present on Admission: Yes     Pressure Injury 10/31/20 Heel Posterior;Right Unstageable - Full thickness tissue loss in which the base of the injury is covered by slough (yellow, tan, gray, green or brown) and/or eschar (tan, brown or black) in the wound bed. eschar (Active)  10/31/20 1500  Location: Heel  Location Orientation: Posterior;Right  Staging: Unstageable - Full thickness tissue loss in which the base of the injury is covered by slough (yellow, tan, gray, green or brown) and/or eschar (tan, brown or black) in the wound bed.  Wound Description (Comments): eschar  Present on Admission: Yes     Pressure Injury 11/25/20 Thigh Right;Medial Stage 3 -  Full thickness tissue loss. Subcutaneous fat may be visible but bone, tendon or muscle are NOT exposed. (Active)  11/25/20 1945  Location: Thigh  Location Orientation: Right;Medial  Staging: Stage 3 -  Full thickness tissue loss. Subcutaneous fat may be visible but bone, tendon or muscle are NOT exposed.  Wound Description (Comments):   Present on Admission: Yes     Nutrition  Problem: Increased nutrient needs Etiology: chronic illness (ESRD on HD, wound healing)   DVT prophylaxis: IV heparin   Code Status:     Code Status Orders  (From admission, onward)           Start     Ordered   11/25/20 1633  Full code  Continuous        11/25/20 1633           Code Status History     Date Active Date Inactive Code Status Order ID Comments User Context   10/31/2020 1232 11/02/2020 2305 Full Code [id number]  Ortencia Kick Inpatient   09/29/2020 1730 10/03/2020 2303 Full Code [id number]  Eulis Foster, MD ED   09/12/2020 1152 09/14/2020 1801 Full Code [id number]  Angelia Mould, MD Inpatient   07/01/2020 0210 07/15/2020 1754 Full Code [id number]  Rise Patience, MD ED      Family Communication: Daughter at bedside Disposition Plan:  Status is: Inpatient  Remains inpatient appropriate because:Ongoing diagnostic testing needed not appropriate for outpatient work up, IV treatments appropriate due to intensity of illness or inability to take PO, and Inpatient level of care appropriate due  to severity of illness  Dispo: The patient is from: Home              Anticipated d/c is to: SNF              Patient currently is not medically stable to d/c.   Difficult to place patient No      Consultants:  Vascular surgery Nephrology  Procedures:  None  Antimicrobials:  Anti-infectives (From admission, onward)    Start     Dose/Rate Route Frequency Ordered Stop   11/26/20 1700  ceFEPIme (MAXIPIME) 1 g in sodium chloride 0.9 % 100 mL IVPB        1 g 200 mL/hr over 30 Minutes Intravenous Every 24 hours 11/25/20 1713     11/26/20 1200  vancomycin (VANCOREADY) IVPB 750 mg/150 mL        750 mg 150 mL/hr over 60 Minutes Intravenous Every T-Th-Sa (Hemodialysis) 11/25/20 1714     11/26/20 1126  vancomycin (VANCOREADY) 750 MG/150ML IVPB       Note to Pharmacy: Cherylann Banas   : cabinet override      11/26/20 1126 11/26/20 1200    11/26/20 1125  vancomycin (VANCOREADY) 750 MG/150ML IVPB       Note to Pharmacy: Cherylann Banas   : cabinet override      11/26/20 1125 11/26/20 1159   11/25/20 1700  vancomycin (VANCOREADY) IVPB 1250 mg/250 mL        1,250 mg 166.7 mL/hr over 90 Minutes Intravenous  Once 11/25/20 1645 11/26/20 0053   11/25/20 1645  ceFEPIme (MAXIPIME) 2 g in sodium chloride 0.9 % 100 mL IVPB        2 g 200 mL/hr over 30 Minutes Intravenous  Once 11/25/20 1644 11/25/20 2255   11/25/20 1500  clindamycin (CLEOCIN) IVPB 600 mg        600 mg 100 mL/hr over 30 Minutes Intravenous  Once 11/25/20 1459 11/25/20 1606        Objective: Vitals:   11/27/20 0314 11/27/20 0500 11/27/20 0900 11/27/20 1222  BP: (!) 102/49  (!) 105/50 (!) 104/54  Pulse: 70  69 73  Resp: 18  17 18   Temp: 98.4 F (36.9 C)  98.3 F (36.8 C) 97.9 F (36.6 C)  TempSrc: Oral  Oral Oral  SpO2: 100%  97% 100%  Weight:  61 kg    Height:        Intake/Output Summary (Last 24 hours) at 11/27/2020 1345 Last data filed at 11/26/2020 1948 Gross per 24 hour  Intake 165.42 ml  Output 1500 ml  Net -1334.58 ml   Filed Weights   11/25/20 1424 11/26/20 1448 11/27/20 0500  Weight: 62.1 kg 61 kg 61 kg    Examination:  General exam: Appears calm and comfortable  Respiratory system: Clear to auscultation. Respiratory effort normal. No respiratory distress. No conversational dyspnea.  On room air Cardiovascular system: S1 & S2 heard. No murmurs. + Bilateral pitting pedal edema. Gastrointestinal system: Abdomen is nondistended, soft and nontender. Normal bowel sounds heard. Central nervous system: Alert and oriented. No focal neurological deficits. Speech clear.  Skin: Multiple areas of eschar wounds in bilateral buttocks, sacrum.  Open and draining wound right medial thigh, all 10 toes with ischemic changes Psychiatry: Judgement and insight appear normal. Mood & affect appropriate.   Data Reviewed: I have personally reviewed following labs  and imaging studies  CBC: Recent Labs  Lab 11/25/20 1412 11/25/20 1512 11/26/20 0550 11/27/20 0202  WBC  19.0*  --  15.9* 16.3*  NEUTROABS 14.3*  --   --  12.3*  HGB 10.3* 9.2* 8.1* 8.4*  HCT 34.0* 27.0* 26.7* 27.7*  MCV 106.9*  --  105.1* 106.9*  PLT 374  --  247 710   Basic Metabolic Panel: Recent Labs  Lab 11/25/20 1412 11/25/20 1512 11/26/20 0550 11/27/20 0202  NA 134* 135 134* 137  K 3.5 3.3* 4.3 3.7  CL 97* 100 100 97*  CO2 22  --  21* 26  GLUCOSE 153* 112* 90 111*  BUN 22 26* 28* 11  CREATININE 3.97* 3.80* 4.23* 2.68*  CALCIUM 8.7*  --  8.2* 7.6*   GFR: Estimated Creatinine Clearance: 16.9 mL/min (A) (by C-G formula based on SCr of 2.68 mg/dL (H)). Liver Function Tests: Recent Labs  Lab 11/25/20 1412  AST 24  ALT 14  ALKPHOS 209*  BILITOT 0.6  PROT 6.1*  ALBUMIN 1.9*   No results for input(s): LIPASE, AMYLASE in the last 168 hours. No results for input(s): AMMONIA in the last 168 hours. Coagulation Profile: Recent Labs  Lab 11/25/20 1412  INR 2.3*   Cardiac Enzymes: No results for input(s): CKTOTAL, CKMB, CKMBINDEX, TROPONINI in the last 168 hours. BNP (last 3 results) No results for input(s): PROBNP in the last 8760 hours. HbA1C: No results for input(s): HGBA1C in the last 72 hours. CBG: Recent Labs  Lab 11/25/20 1651 11/26/20 0622 11/26/20 1635 11/27/20 0615 11/27/20 1225  GLUCAP 114* 94 130* 116* 152*   Lipid Profile: No results for input(s): CHOL, HDL, LDLCALC, TRIG, CHOLHDL, LDLDIRECT in the last 72 hours. Thyroid Function Tests: No results for input(s): TSH, T4TOTAL, FREET4, T3FREE, THYROIDAB in the last 72 hours. Anemia Panel: No results for input(s): VITAMINB12, FOLATE, FERRITIN, TIBC, IRON, RETICCTPCT in the last 72 hours. Sepsis Labs: Recent Labs  Lab 11/25/20 1406 11/25/20 2302 11/26/20 0550 11/27/20 0202  LATICACIDVEN 4.6* 3.8* 2.1* 2.4*    Recent Results (from the past 240 hour(s))  Resp Panel by RT-PCR (Flu A&B,  Covid) Nasopharyngeal Swab     Status: None   Collection Time: 11/25/20  2:06 PM   Specimen: Nasopharyngeal Swab; Nasopharyngeal(NP) swabs in vial transport medium  Result Value Ref Range Status   SARS Coronavirus 2 by RT PCR NEGATIVE NEGATIVE Final    Comment: (NOTE) SARS-CoV-2 target nucleic acids are NOT DETECTED.  The SARS-CoV-2 RNA is generally detectable in upper respiratory specimens during the acute phase of infection. The lowest concentration of SARS-CoV-2 viral copies this assay can detect is 138 copies/mL. A negative result does not preclude SARS-Cov-2 infection and should not be used as the sole basis for treatment or other patient management decisions. A negative result may occur with  improper specimen collection/handling, submission of specimen other than nasopharyngeal swab, presence of viral mutation(s) within the areas targeted by this assay, and inadequate number of viral copies(<138 copies/mL). A negative result must be combined with clinical observations, patient history, and epidemiological information. The expected result is Negative.  Fact Sheet for Patients:  EntrepreneurPulse.com.au  Fact Sheet for Healthcare Providers:  IncredibleEmployment.be  This test is no t yet approved or cleared by the [place] FDA and  has been authorized for detection and/or diagnosis of SARS-CoV-2 by FDA under an Emergency Use Authorization (EUA). This EUA will remain  in effect (meaning this test can be used) for the duration of the COVID-19 declaration under Section 564(b)(1) of the Act, 21 U.S.C.section 360bbb-3(b)(1), unless the authorization is terminated  or revoked  sooner.       Influenza A by PCR NEGATIVE NEGATIVE Final   Influenza B by PCR NEGATIVE NEGATIVE Final    Comment: (NOTE) The Xpert Xpress SARS-CoV-2/FLU/RSV plus assay is intended as an aid in the diagnosis of influenza from Nasopharyngeal swab specimens and should  not be used as a sole basis for treatment. Nasal washings and aspirates are unacceptable for Xpert Xpress SARS-CoV-2/FLU/RSV testing.  Fact Sheet for Patients: EntrepreneurPulse.com.au  Fact Sheet for Healthcare Providers: IncredibleEmployment.be  This test is not yet approved or cleared by the [place] FDA and has been authorized for detection and/or diagnosis of SARS-CoV-2 by FDA under an Emergency Use Authorization (EUA). This EUA will remain in effect (meaning this test can be used) for the duration of the COVID-19 declaration under Section 564(b)(1) of the Act, 21 U.S.C. section 360bbb-3(b)(1), unless the authorization is terminated or revoked.  Performed at [facility], [place] [address] [place], [place] [address]   Blood Culture (routine x 2)     Status: None (Preliminary result)   Collection Time: 11/25/20 11:01 PM   Specimen: BLOOD RIGHT HAND  Result Value Ref Range Status   Specimen Description BLOOD RIGHT HAND  Final   Special Requests   Final    BOTTLES DRAWN AEROBIC ONLY Blood Culture results may not be optimal due to an inadequate volume of blood received in culture bottles   Culture   Final    NO GROWTH 1 DAY Performed at [facility], [place] [address], [place], [place] [address]    Report Status PENDING  Incomplete      Radiology Studies: DG Chest Port 1 View  Result Date: 11/25/2020 CLINICAL DATA:  Questionable sepsis, evaluate for abnormality. EXAM: PORTABLE CHEST 1 VIEW COMPARISON:  Chest radiographs 09/29/2020 and earlier. FINDINGS: Unchanged position of a right-sided dialysis catheter with tip projecting at the level of the superior cavoatrial junction/upper right atrium. Prior median sternotomy/CABG. Heart size within normal limits. Aortic atherosclerosis. No appreciable airspace consolidation or pulmonary edema. No evidence of pleural effusion or pneumothorax. No acute bony abnormality identified.  IMPRESSION: No evidence of acute cardiopulmonary abnormality. Aortic Atherosclerosis (ICD10-I70.0). Electronically Signed   By: Kellie Simmering DO   On: 11/25/2020 15:01      Scheduled Meds:  (feeding supplement) PROSource Plus  30 mL Oral BID BM   amiodarone  200 mg Oral Daily   vitamin C  500 mg Oral BID   aspirin EC  81 mg Oral Daily   atorvastatin  20 mg Oral Daily   Chlorhexidine Gluconate Cloth  6 each Topical Q0600   famotidine  20 mg Oral Daily   feeding supplement (NEPRO CARB STEADY)  237 mL Oral BID BM   folic acid  1 mg Oral Daily   insulin aspart  0-6 Units Subcutaneous TID WC   linagliptin  5 mg Oral Daily   multivitamin  1 tablet Oral QHS   nutrition supplement (JUVEN)  1 packet Oral BID BM   sevelamer carbonate  800 mg Oral TID WC   Continuous Infusions:  ceFEPime (MAXIPIME) IV 1 g (11/26/20 1600)   heparin 1,000 Units/hr (11/27/20 0555)   sodium thiosulfate infusion for calciphylaxis 25 g (11/26/20 1354)   vancomycin 750 mg (11/26/20 1353)     LOS: 2 days      Time spent: 40 minutes   Dessa Phi, DO [facility] 11/27/2020, 1:45 PM   Available via Epic secure chat 7am-7pm After these hours, please  refer to coverage provider listed on [email or web address]

## 2020-11-27 NOTE — Progress Notes (Signed)
ANTICOAGULATION CONSULT NOTE   Pharmacy Consult for heparin Indication: atrial fibrillation  Allergies  Allergen Reactions   Penicillins Anaphylaxis    Anaphylaxis as a child    Patient Measurements: Height: 5\' 3"  (160 cm) Weight: 61 kg (134 lb 7.7 oz) IBW/kg (Calculated) : 52.4 Heparin Dosing Weight: 62.1kg  Vital Signs: Temp: 97.9 F (36.6 C) (06/19 1222) Temp Source: Oral (06/19 1222) BP: 104/54 (06/19 1222) Pulse Rate: 73 (06/19 1222)  Labs: Recent Labs    11/25/20 1412 11/25/20 1512 11/26/20 0550 11/26/20 1620 11/27/20 0202 11/27/20 1146  HGB 10.3* 9.2* 8.1*  --  8.4*  --   HCT 34.0* 27.0* 26.7*  --  27.7*  --   PLT 374  --  247  --  228  --   APTT 45*  --  58* 69* 51* 76*  LABPROT 25.0*  --   --   --   --   --   INR 2.3*  --   --   --   --   --   HEPARINUNFRC  --   --  >1.10* >1.10* >1.10*  --   CREATININE 3.97* 3.80* 4.23*  --  2.68*  --      Estimated Creatinine Clearance: 16.9 mL/min (A) (by C-G formula based on SCr of 2.68 mg/dL (H)).   Assessment: 70 y.o. female with h/o Afib, Eliquis on hold, for heparin.   Goal of Therapy:  APTT 66-102 Heparin level 0.3-0.7 units/ml Monitor platelets by anticoagulation protocol: Yes   Plan:  Continue Heparin 1000 units/hr Daily aptt and HL, CBC Monitor for signs of bleeding  Norina Buzzard, PharmD PGY1 Pharmacy Resident 11/27/2020 1:23 PM

## 2020-11-27 NOTE — Progress Notes (Signed)
[facility] Progress Note   Subjective:  Seen in room and examined patient at same time as hospitalist, daughter at bedside who provides more detailed Hx. She denies CP or dyspnea this morning; pain in all wounds with movements. Ms. Ruotolo did well with HD yesterday - 1.5L net UF, legs are a little less edematous today. Awaiting surgical plan for toes/wounds. Discussed the hip wounds with daughter - she reports started as pain alone, then "bruising", then changed to black eschar -- story definitely fits with calciphylaxis.  Objective Vitals:   11/26/20 2330 11/27/20 0314 11/27/20 0500 11/27/20 0900  BP: (!) 101/48 (!) 102/49  (!) 105/50  Pulse: 71 70  69  Resp: 18 18  17   Temp: 98.8 F (37.1 C) 98.4 F (36.9 C)  98.3 F (36.8 C)  TempSrc: Oral Oral  Oral  SpO2: 99% 100%  97%  Weight:   61 kg   Height:       Physical Exam General: Chronically ill appearing woman, NAD Heart: RRR; no murmur Lungs: CTA anteiorly Abdomen: soft, non-tender Extremities: 2+ pitting LE edema to knees. All toes with dry gangrene. Large, black eschar wounds to B hips/sacrum. R thigh with erythema and purulent drainage, packed with gauze. Dialysis Access: TDC in R chest, LUE AVF + bruit  Additional Objective Labs: Basic Metabolic Panel: Recent Labs  Lab 11/25/20 1412 11/25/20 1512 11/26/20 0550 11/27/20 0202  NA 134* 135 134* 137  K 3.5 3.3* 4.3 3.7  CL 97* 100 100 97*  CO2 22  --  21* 26  GLUCOSE 153* 112* 90 111*  BUN 22 26* 28* 11  CREATININE 3.97* 3.80* 4.23* 2.68*  CALCIUM 8.7*  --  8.2* 7.6*   Liver Function Tests: Recent Labs  Lab 11/25/20 1412  AST 24  ALT 14  ALKPHOS 209*  BILITOT 0.6  PROT 6.1*  ALBUMIN 1.9*   CBC: Recent Labs  Lab 11/25/20 1412 11/25/20 1512 11/26/20 0550 11/27/20 0202  WBC 19.0*  --  15.9* 16.3*  NEUTROABS 14.3*  --   --  12.3*  HGB 10.3* 9.2* 8.1* 8.4*  HCT 34.0* 27.0* 26.7* 27.7*  MCV 106.9*  --  105.1* 106.9*  PLT 374  --  247  228   Blood Culture    Component Value Date/Time   SDES BLOOD RIGHT HAND 11/25/2020 2301   SPECREQUEST  11/25/2020 2301    BOTTLES DRAWN AEROBIC ONLY Blood Culture results may not be optimal due to an inadequate volume of blood received in culture bottles   CULT  11/25/2020 2301    NO GROWTH 1 DAY Performed at [facility], [place] [address], [place], [place] [address]    REPTSTATUS PENDING 11/25/2020 2301   Studies/Results: DG Chest Port 1 View  Result Date: 11/25/2020 CLINICAL DATA:  Questionable sepsis, evaluate for abnormality. EXAM: PORTABLE CHEST 1 VIEW COMPARISON:  Chest radiographs 09/29/2020 and earlier. FINDINGS: Unchanged position of a right-sided dialysis catheter with tip projecting at the level of the superior cavoatrial junction/upper right atrium. Prior median sternotomy/CABG. Heart size within normal limits. Aortic atherosclerosis. No appreciable airspace consolidation or pulmonary edema. No evidence of pleural effusion or pneumothorax. No acute bony abnormality identified. IMPRESSION: No evidence of acute cardiopulmonary abnormality. Aortic Atherosclerosis (ICD10-I70.0). Electronically Signed   By: Kellie Simmering DO   On: 11/25/2020 15:01    Medications:  ceFEPime (MAXIPIME) IV 1 g (11/26/20 1600)   heparin 1,000 Units/hr (11/27/20 0555)   sodium thiosulfate infusion for calciphylaxis 25  g (11/26/20 1354)   vancomycin 750 mg (11/26/20 1353)    amiodarone  200 mg Oral Daily   vitamin C  500 mg Oral BID   aspirin EC  81 mg Oral Daily   atorvastatin  20 mg Oral Daily   Chlorhexidine Gluconate Cloth  6 each Topical Q0600   famotidine  20 mg Oral Daily   feeding supplement (NEPRO CARB STEADY)  237 mL Oral BID BM   folic acid  1 mg Oral Daily   insulin aspart  0-6 Units Subcutaneous TID WC   linagliptin  5 mg Oral Daily   multivitamin  1 tablet Oral QHS   nutrition supplement (JUVEN)  1 packet Oral BID BM   sevelamer carbonate  800 mg Oral TID WC    Dialysis  Orders: TTS at [facility] 3:45hr, 250/500 (new cannulation protocol AVF), EDW 59.5kg, 3K/2.5Ca, AVF + TDC, no heparin - Venofer 100mg  x 5 ordered - Mircera 144mcg IV q 2 weeks (last 6/14)   Assessment/Plan:  Extensive gangrenous wounds (all toes, R thigh, R hip, entire sacrum, L hip): On Vanc/Cefepime. VVS surgical plan pending. Calciphylaxis wounds: B hip/sacrum. Discussed with daughter in detail today. Plan to treat empirically with sodium thiosulfate and discontinue all Ca/Vit D/iron products.  ESRD:  Continue HD per TTS schedule - next HD 6/21. She has significant LE edema - will try to work on this.  Hypertension/volume: HyPOtensive on admit - home meds on hold. UF as tolerated.  Anemia: Hgb 8.4 - not due for ESA yet. 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  Metabolic bone disease: CorrCa ok, Phos pending. D/c Vit D3 and changed binder from Auryxia -> Renvela. Using 2Ca bath with dialysis.  Nutrition:  Alb very low, apparently dislikes Nepro -> try ProSource.  CAD  PAD  T2DM  A-fib on Eliquis: Heparin bridge for upcoming surgery.  GOC: Needs ongoing dialogue and will see how wounds progress.  Veneta Penton, PA-C 11/27/2020, 11:06 AM  [employer]

## 2020-11-27 NOTE — Progress Notes (Signed)
ANTICOAGULATION CONSULT NOTE   Pharmacy Consult for heparin Indication: atrial fibrillation  Allergies  Allergen Reactions   Penicillins Anaphylaxis    Anaphylaxis as a child    Patient Measurements: Height: 5\' 3"  (160 cm) Weight: 61 kg (134 lb 7.7 oz) IBW/kg (Calculated) : 52.4 Heparin Dosing Weight: 62.1kg  Vital Signs: Temp: 98.4 F (36.9 C) (06/19 0314) Temp Source: Oral (06/19 0314) BP: 102/49 (06/19 0314) Pulse Rate: 70 (06/19 0314)  Labs: Recent Labs    11/25/20 1412 11/25/20 1512 11/26/20 0550 11/26/20 1620 11/27/20 0202  HGB 10.3* 9.2* 8.1*  --  8.4*  HCT 34.0* 27.0* 26.7*  --  27.7*  PLT 374  --  247  --  228  APTT 45*  --  58* 69* 51*  LABPROT 25.0*  --   --   --   --   INR 2.3*  --   --   --   --   HEPARINUNFRC  --   --  >1.10* >1.10* >1.10*  CREATININE 3.97* 3.80* 4.23*  --  2.68*     Estimated Creatinine Clearance: 16.9 mL/min (A) (by C-G formula based on SCr of 2.68 mg/dL (H)).   Assessment: 70 y.o. female with h/o Afib, Eliquis on hold, for heparin  Goal of Therapy:  APTT 66-102 Heparin level 0.3-0.7 units/ml Monitor platelets by anticoagulation protocol: Yes   Plan:  Increase Heparin 1000 units/hr Check aPTT in 8 hours  Phillis Knack, PharmD, BCPS

## 2020-11-27 NOTE — Progress Notes (Signed)
Pharmacy Antibiotic Note  Kathryn Hancock is a 70 y.o. female admitted on 11/25/2020 with wound infection of sacrum and feet  Pharmacy has been consulted for vancomycin and cefepime dosing.  Toes are remarkably gangrenous, also has wound to back and inner-thigh. Leukocytosis improving(19>16), afebrile, BP stable, HR normal.   Plan: Cefepime 1g q24hr Vancomycin 1250mg  IV x1 Then vancomycin 750mg  qHD TTS  F/u cultures, signs of infections, surgical plans  Height: 5\' 3"  (160 cm) Weight: 61 kg (134 lb 7.7 oz) IBW/kg (Calculated) : 52.4  Temp (24hrs), Avg:98.4 F (36.9 C), Min:97.9 F (36.6 C), Max:99 F (37.2 C)  Recent Labs  Lab 11/25/20 1406 11/25/20 1412 11/25/20 1512 11/25/20 2302 11/26/20 0550 11/27/20 0202  WBC  --  19.0*  --   --  15.9* 16.3*  CREATININE  --  3.97* 3.80*  --  4.23* 2.68*  LATICACIDVEN 4.6*  --   --  3.8* 2.1* 2.4*     Estimated Creatinine Clearance: 16.9 mL/min (A) (by C-G formula based on SCr of 2.68 mg/dL (H)).    Allergies  Allergen Reactions   Penicillins Anaphylaxis    Anaphylaxis as a child    Antimicrobials this admission: Cefepime 6/17 >> Vanc 6/17 >>  Dose adjustments this admission: NA  Microbiology results: 6/17 BCx: pend  Thank you for allowing pharmacy to be a part of this patient's care.  Norina Buzzard, PharmD PGY1 Pharmacy Resident 11/27/2020 1:28 PM

## 2020-11-28 ENCOUNTER — Encounter (HOSPITAL_COMMUNITY): Payer: Self-pay | Admitting: Internal Medicine

## 2020-11-28 ENCOUNTER — Encounter (HOSPITAL_COMMUNITY)
Admission: EM | Disposition: A | Discharge status: Skilled Nursing Facility | Payer: Self-pay | Source: Home / Self Care | Attending: Student

## 2020-11-28 ENCOUNTER — Inpatient Hospital Stay (HOSPITAL_COMMUNITY): Payer: Medicare Other | Admitting: Anesthesiology

## 2020-11-28 DIAGNOSIS — T8131XA Disruption of external operation (surgical) wound, not elsewhere classified, initial encounter: Secondary | ICD-10-CM

## 2020-11-28 DIAGNOSIS — Y838 Other surgical procedures as the cause of abnormal reaction of the patient, or of later complication, without mention of misadventure at the time of the procedure: Secondary | ICD-10-CM

## 2020-11-28 DIAGNOSIS — I96 Gangrene, not elsewhere classified: Secondary | ICD-10-CM

## 2020-11-28 HISTORY — PX: I & D EXTREMITY: SHX5045

## 2020-11-28 HISTORY — PX: TRANSMETATARSAL AMPUTATION: SHX6197

## 2020-11-28 LAB — PROTIME-INR
INR: 1.3 — ABNORMAL HIGH (ref 0.8–1.2)
Prothrombin Time: 16.1 seconds — ABNORMAL HIGH (ref 11.4–15.2)

## 2020-11-28 LAB — BASIC METABOLIC PANEL
Anion gap: 12 (ref 5–15)
BUN: 24 mg/dL — ABNORMAL HIGH (ref 8–23)
CO2: 24 mmol/L (ref 22–32)
Calcium: 7.8 mg/dL — ABNORMAL LOW (ref 8.9–10.3)
Chloride: 101 mmol/L (ref 98–111)
Creatinine, Ser: 3.87 mg/dL — ABNORMAL HIGH (ref 0.44–1.00)
GFR, Estimated: 12 mL/min — ABNORMAL LOW (ref >60)
Glucose, Bld: 144 mg/dL — ABNORMAL HIGH (ref 70–99)
Potassium: 3 mmol/L — ABNORMAL LOW (ref 3.5–5.1)
Sodium: 137 mmol/L (ref 135–145)

## 2020-11-28 LAB — GLUCOSE, CAPILLARY
Glucose-Capillary: 149 mg/dL — ABNORMAL HIGH (ref 70–99)
Glucose-Capillary: 109 mg/dL — ABNORMAL HIGH (ref 70–99)
Glucose-Capillary: 95 mg/dL (ref 70–99)
Glucose-Capillary: 117 mg/dL — ABNORMAL HIGH (ref 70–99)
Glucose-Capillary: 151 mg/dL — ABNORMAL HIGH (ref 70–99)

## 2020-11-28 LAB — CBC
HCT: 25.6 % — ABNORMAL LOW (ref 36.0–46.0)
Hemoglobin: 7.9 g/dL — ABNORMAL LOW (ref 12.0–15.0)
MCH: 33.2 pg (ref 26.0–34.0)
MCHC: 30.9 g/dL (ref 30.0–36.0)
MCV: 107.6 fL — ABNORMAL HIGH (ref 80.0–100.0)
Platelets: 185 10*3/uL (ref 150–400)
RBC: 2.38 MIL/uL — ABNORMAL LOW (ref 3.87–5.11)
RDW: 21.8 % — ABNORMAL HIGH (ref 11.5–15.5)
WBC: 13 10*3/uL — ABNORMAL HIGH (ref 4.0–10.5)
nRBC: 0.2 % (ref 0.0–0.2)

## 2020-11-28 LAB — HEPARIN LEVEL (UNFRACTIONATED): Heparin Unfractionated: >1.1 IU/mL — ABNORMAL HIGH (ref 0.30–0.70)

## 2020-11-28 LAB — APTT: aPTT: 76 seconds — ABNORMAL HIGH (ref 24–36)

## 2020-11-28 SURGERY — IRRIGATION AND DEBRIDEMENT EXTREMITY
Anesthesia: General | Site: Toe | Laterality: Right

## 2020-11-28 MED ORDER — PROPOFOL 10 MG/ML IV BOLUS
INTRAVENOUS | Status: DC | PRN
  Administered 2020-11-28: 130 mg via INTRAVENOUS
  Administered 2020-11-28 (×2): 20 mg via INTRAVENOUS

## 2020-11-28 MED ORDER — PROPOFOL 10 MG/ML IV BOLUS
INTRAVENOUS | Status: AC
  Filled 2020-11-28: qty 20

## 2020-11-28 MED ORDER — FENTANYL CITRATE (PF) 100 MCG/2ML IJ SOLN
25.0000 ug | INTRAMUSCULAR | Status: DC | PRN
  Administered 2020-11-28 (×2): 50 ug via INTRAVENOUS

## 2020-11-28 MED ORDER — BACITRACIN ZINC 500 UNIT/GM EX OINT
TOPICAL_OINTMENT | CUTANEOUS | Status: DC | PRN
  Administered 2020-11-28: 1 via TOPICAL

## 2020-11-28 MED ORDER — SODIUM CHLORIDE 0.9 % IV SOLN: INTRAVENOUS | Status: DC

## 2020-11-28 MED ORDER — SODIUM CHLORIDE 0.9 % IV SOLN: 100.0000 mL | INTRAVENOUS | Status: DC | PRN

## 2020-11-28 MED ORDER — OXYCODONE HCL 5 MG PO TABS: 5.0000 mg | ORAL_TABLET | Freq: Once | ORAL | Status: DC | PRN

## 2020-11-28 MED ORDER — CHLORHEXIDINE GLUCONATE 0.12 % MT SOLN
OROMUCOSAL | Status: AC
  Filled 2020-11-28: qty 15

## 2020-11-28 MED ORDER — PROMETHAZINE HCL 25 MG/ML IJ SOLN: 6.2500 mg | INTRAMUSCULAR | Status: DC | PRN

## 2020-11-28 MED ORDER — LIDOCAINE-PRILOCAINE 2.5-2.5 % EX CREA
1.0000 "application " | TOPICAL_CREAM | CUTANEOUS | Status: DC | PRN
  Filled 2020-11-28: qty 5

## 2020-11-28 MED ORDER — OXYCODONE HCL 5 MG/5ML PO SOLN: 5.0000 mg | Freq: Once | ORAL | Status: DC | PRN

## 2020-11-28 MED ORDER — PENTAFLUOROPROP-TETRAFLUOROETH EX AERO: 1.0000 "application " | INHALATION_SPRAY | CUTANEOUS | Status: DC | PRN

## 2020-11-28 MED ORDER — HEPARIN SODIUM (PORCINE) 1000 UNIT/ML DIALYSIS
1000.0000 [IU] | INTRAMUSCULAR | Status: DC | PRN
  Filled 2020-11-28: qty 1

## 2020-11-28 MED ORDER — CHLORHEXIDINE GLUCONATE 0.12 % MT SOLN
15.0000 mL | Freq: Once | OROMUCOSAL | Status: AC
  Administered 2020-11-28: 15 mL via OROMUCOSAL

## 2020-11-28 MED ORDER — FENTANYL CITRATE (PF) 100 MCG/2ML IJ SOLN
INTRAMUSCULAR | Status: AC
  Filled 2020-11-28: qty 2

## 2020-11-28 MED ORDER — MEPERIDINE HCL 25 MG/ML IJ SOLN: 6.2500 mg | INTRAMUSCULAR | Status: DC | PRN

## 2020-11-28 MED ORDER — FENTANYL CITRATE (PF) 250 MCG/5ML IJ SOLN
INTRAMUSCULAR | Status: DC | PRN
  Administered 2020-11-28 (×3): 25 ug via INTRAVENOUS

## 2020-11-28 MED ORDER — LIDOCAINE HCL (PF) 1 % IJ SOLN: 5.0000 mL | INTRAMUSCULAR | Status: DC | PRN

## 2020-11-28 MED ORDER — BACITRACIN ZINC 500 UNIT/GM EX OINT
TOPICAL_OINTMENT | CUTANEOUS | Status: AC
  Filled 2020-11-28: qty 28.35

## 2020-11-28 MED ORDER — ONDANSETRON HCL 4 MG/2ML IJ SOLN
INTRAMUSCULAR | Status: DC | PRN
  Administered 2020-11-28: 4 mg via INTRAVENOUS

## 2020-11-28 MED ORDER — ORAL CARE MOUTH RINSE: 15.0000 mL | Freq: Once | OROMUCOSAL | Status: AC

## 2020-11-28 MED ORDER — LIDOCAINE HCL (PF) 2 % IJ SOLN
INTRAMUSCULAR | Status: DC | PRN
  Administered 2020-11-28: 40 mg via INTRADERMAL

## 2020-11-28 MED ORDER — 0.9 % SODIUM CHLORIDE (POUR BTL) OPTIME
TOPICAL | Status: DC | PRN
  Administered 2020-11-28: 1000 mL

## 2020-11-28 MED ORDER — MIDAZOLAM HCL 2 MG/2ML IJ SOLN
INTRAMUSCULAR | Status: AC
  Filled 2020-11-28: qty 2

## 2020-11-28 MED ORDER — MIDAZOLAM HCL 2 MG/2ML IJ SOLN
0.5000 mg | Freq: Once | INTRAMUSCULAR | Status: DC | PRN
Start: 2020-11-28 — End: 2020-11-28

## 2020-11-28 MED ORDER — FENTANYL CITRATE (PF) 250 MCG/5ML IJ SOLN
INTRAMUSCULAR | Status: AC
  Filled 2020-11-28: qty 5

## 2020-11-28 SURGICAL SUPPLY — 39 items
BLADE AVERAGE 25MMX9MM (BLADE) ×1
BLADE AVERAGE 25X9 (BLADE) ×3 IMPLANT
BNDG ELASTIC 4X5.8 VLCR STR LF (GAUZE/BANDAGES/DRESSINGS) ×4 IMPLANT
BNDG ELASTIC 6X15 VLCR STRL LF (GAUZE/BANDAGES/DRESSINGS) ×4 IMPLANT
BNDG ELASTIC 6X5.8 VLCR STR LF (GAUZE/BANDAGES/DRESSINGS) IMPLANT
BNDG GAUZE ELAST 4 BULKY (GAUZE/BANDAGES/DRESSINGS) ×8 IMPLANT
CANISTER SUCT 3000ML PPV (MISCELLANEOUS) ×4 IMPLANT
COVER SURGICAL LIGHT HANDLE (MISCELLANEOUS) ×4 IMPLANT
COVER WAND RF STERILE (DRAPES) ×4 IMPLANT
DRAPE HALF SHEET 40X57 (DRAPES) ×4 IMPLANT
DRAPE INCISE IOBAN 66X45 STRL (DRAPES) ×4 IMPLANT
DRAPE ORTHO SPLIT 77X108 STRL (DRAPES) ×8
DRAPE SURG ORHT 6 SPLT 77X108 (DRAPES) ×8 IMPLANT
DRSG ADAPTIC 3X8 NADH LF (GAUZE/BANDAGES/DRESSINGS) ×4 IMPLANT
ELECT REM PT RETURN 9FT ADLT (ELECTROSURGICAL) ×4
ELECTRODE REM PT RTRN 9FT ADLT (ELECTROSURGICAL) ×2 IMPLANT
GAUZE SPONGE 4X4 12PLY STRL (GAUZE/BANDAGES/DRESSINGS) ×4 IMPLANT
GAUZE SPONGE 4X4 12PLY STRL LF (GAUZE/BANDAGES/DRESSINGS) ×8 IMPLANT
GLOVE BIO SURGEON STRL SZ7.5 (GLOVE) ×8 IMPLANT
GLOVE SRG 8 PF TXTR STRL LF DI (GLOVE) ×2 IMPLANT
GLOVE SURG MICRO LTX SZ6.5 (GLOVE) ×4 IMPLANT
GLOVE SURG UNDER POLY LF SZ8 (GLOVE) ×2
GOWN STRL REUS W/ TWL LRG LVL3 (GOWN DISPOSABLE) ×6 IMPLANT
GOWN STRL REUS W/TWL LRG LVL3 (GOWN DISPOSABLE) ×6
KIT BASIN OR (CUSTOM PROCEDURE TRAY) ×4 IMPLANT
KIT TURNOVER KIT B (KITS) ×4 IMPLANT
NS IRRIG 1000ML POUR BTL (IV SOLUTION) ×4 IMPLANT
PACK CV ACCESS (CUSTOM PROCEDURE TRAY) IMPLANT
PACK GENERAL/GYN (CUSTOM PROCEDURE TRAY) IMPLANT
PAD ARMBOARD 7.5X6 YLW CONV (MISCELLANEOUS) ×8 IMPLANT
SPONGE LAP 18X18 RF (DISPOSABLE) ×4 IMPLANT
STAPLER VISISTAT 35W (STAPLE) ×4 IMPLANT
SUT ETHILON 3 0 PS 1 (SUTURE) ×8 IMPLANT
SUT MNCRL AB 4-0 PS2 18 (SUTURE) IMPLANT
SUT VIC AB 2-0 CTB1 (SUTURE) IMPLANT
SUT VIC AB 3-0 SH 27 (SUTURE) ×6
SUT VIC AB 3-0 SH 27X BRD (SUTURE) ×6 IMPLANT
TOWEL GREEN STERILE (TOWEL DISPOSABLE) ×4 IMPLANT
WATER STERILE IRR 1000ML POUR (IV SOLUTION) ×4 IMPLANT

## 2020-11-28 NOTE — Anesthesia Preprocedure Evaluation (Addendum)
Anesthesia Evaluation  Patient identified by MRN, date of birth, ID band Patient awake    Reviewed: Allergy & Precautions, NPO status , Patient's Chart, lab work & pertinent test results  History of Anesthesia Complications (+) PONV  Airway Mallampati: II  TM Distance: >3 FB Neck ROM: Full    Dental  (+) Edentulous Upper, Edentulous Lower   Pulmonary COPD,  COPD inhaler, Current Smoker and Patient abstained from smoking.,    breath sounds clear to auscultation       Cardiovascular hypertension, Pt. on medications (-) angina+ CAD, + CABG and + Peripheral Vascular Disease (AAA: Distal abdominal aortic aneurysm 4.5 x 4.7 cm.  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 LV has normal function, no regional wall motion abnormalities, Grade I DD, no significant valvular abnormalities   Neuro/Psych Depression negative neurological ROS     GI/Hepatic Neg liver ROS, GERD  Medicated and Controlled,  Endo/Other  diabetes (glu 109), Oral Hypoglycemic Agents  Renal/GU ESRF and DialysisRenal disease (K+ 3.0)[facility] dialysis     Musculoskeletal   Abdominal   Peds  Hematology  (+) Blood dyscrasia (Hb 7.9), anemia , eliquis   Anesthesia Other Findings   Reproductive/Obstetrics                            Anesthesia Physical Anesthesia Plan  ASA: 3  Anesthesia Plan: General   Post-op Pain Management:    Induction: Intravenous  PONV Risk Score and Plan: 3 and Ondansetron, Dexamethasone and Treatment may vary due to age or medical condition  Airway Management Planned: LMA  Additional Equipment: None  Intra-op Plan:   Post-operative Plan:   Informed Consent: I have reviewed the patients History and Physical, chart, labs and discussed the procedure  including the risks, benefits and alternatives for the proposed anesthesia with the patient or authorized representative who has indicated his/her understanding and acceptance.       Plan Discussed with: CRNA and Surgeon  Anesthesia Plan Comments:        Anesthesia Quick Evaluation

## 2020-11-28 NOTE — Progress Notes (Signed)
ANTICOAGULATION CONSULT NOTE   Pharmacy Consult for heparin Indication: atrial fibrillation  Allergies  Allergen Reactions   Penicillins Anaphylaxis    Anaphylaxis as a child    Patient Measurements: Height: 5\' 3"  (160 cm) Weight: 60 kg (132 lb 4.4 oz) IBW/kg (Calculated) : 52.4 Heparin Dosing Weight: 62.1kg  Vital Signs: Temp: 98.4 F (36.9 C) (06/20 0845) Temp Source: Oral (06/20 0845) BP: 139/72 (06/20 0845) Pulse Rate: 71 (06/20 0845)  Labs: Recent Labs    11/25/20 1412 11/25/20 1512 11/26/20 0550 11/26/20 1620 11/27/20 0202 11/27/20 1146 11/28/20 0233  HGB 10.3*   < > 8.1*  --  8.4*  --  7.9*  HCT 34.0*   < > 26.7*  --  27.7*  --  25.6*  PLT 374  --  247  --  228  --  185  APTT 45*  --  58* 69* 51* 76* 76*  LABPROT 25.0*  --   --   --   --   --  16.1*  INR 2.3*  --   --   --   --   --  1.3*  HEPARINUNFRC  --    < > >1.10* >1.10* >1.10*  --  >1.10*  CREATININE 3.97*   < > 4.23*  --  2.68*  --  3.87*   < > = values in this interval not displayed.     Estimated Creatinine Clearance: 11.7 mL/min (A) (by C-G formula based on SCr of 3.87 mg/dL (H)).   Assessment: 70 y.o. female with h/o Afib, Eliquis on hold for procedures, pharmacy to manage heparin. Will monitor with aPTT until correlating with heparin levels. S/p R. femoral to above-knee popliteal artery bypass in May 2022.  However, surgical wound on the right thigh never healed despite wound care at home.  Wounds on bilateral hips have also become larger and deeper despite local wound care at home. Plan is for debridement of incisions today and potentially digital amputations.  Goal of Therapy:  APTT 66-102 Heparin level 0.3-0.7 units/ml Monitor platelets by anticoagulation protocol: Yes   Plan:  Continue Heparin 1000 units/hr Check heparin level/aPTT daily while on heparin Continue to monitor H&H and platelets   Thank you for allowing [place] to participate in this patients care. Jens Som,  PharmD 11/28/2020 9:02 AM  Please check [email or web address] for unit-specific pharmacy phone numbers.

## 2020-11-28 NOTE — Progress Notes (Signed)
CRITICAL RESULT PROVIDER NOTIFICATION  Test performed and critical result:  CBC- HGB/HCT: 7.9/25.6  Date and time result received:  11/28/20 0233  Provider name/title: Dr. Annye Asa -anesthesiologist  Date and time provider notified: 11/28/20 0855  Date and time provider responded: 11/28/20 0855  Provider response:See new orders

## 2020-11-28 NOTE — Progress Notes (Signed)
PROGRESS NOTE    Kathryn Hancock  WPY:[id number] DOB: 03-14-1951 DOA: 11/25/2020 PCP: Gala Lewandowsky, MD     Brief Narrative:  Kathryn Hancock is a 70 y.o. female with medical history significant of PAD with status post left and right leg bypasses, chronic bilateral toes ischemia, unhealing wound of right thigh after right femoropopliteal bypass, ESRD on HD TTS, PAF on Eliquis, IDDM, HTN, CAD status post CABG, HTN, HLD, presented with worsening of gangrene-like changes on bilateral toes and on healing wound of right thigh, and unhealing ulcers of bilateral hips.  Vascular surgery consulted  New events last 24 hours / Subjective: No new complaints today, denies any chest pain or shortness of breath.  Scheduled to undergo excisional debridement of the right thigh and right transmetatarsal amputation today  Assessment & Plan:   Active Problems:   ESRD (end stage renal disease) on dialysis ([place])   Pressure injury of skin   Limb ischemia   Nonhealing ulcer of the right medial thigh at the site of saphenectomy incision -Sepsis ruled out, afebrile, heart rate and respiratory rate within normal range on admission although she did have leukocytosis as well as lactic acidosis -Blood cultures negative to date -Continue vancomycin, cefepime -Vascular surgery following, status post excisional debridement 6/20 -Continue IV heparin (Eliquis PTA)  Calciphylaxis -Appreciate nephrology, sodium thiosulfate with dialysis and local wound care  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 Subcutaneous fat may be visible but bone, tendon or muscle are NOT exposed. 2 cm x 1 cm shallow bed with yellow/white (Active)     Location: Coccyx  Location Orientation: Medial  Staging: Stage 3 -  Full thickness tissue loss. Subcutaneous fat may be visible but bone, tendon or muscle are NOT exposed.  Wound Description (Comments): 2 cm x 1 cm shallow bed with yellow/white  Present on Admission: Yes     Pressure Injury 10/31/20 Buttocks Right;Upper Unstageable - Full thickness tissue loss in which the base of the injury is covered by slough (yellow, tan, gray, green or brown) and/or eschar (tan, brown or black) in the wound bed. 2 cm x 1.5 cm eschar (Active)  10/31/20 1500  Location: Buttocks  Location Orientation: Right;Upper  Staging: Unstageable - Full thickness tissue loss in which the base of the injury is covered by slough (yellow, tan, gray, green or brown) and/or eschar (tan, brown or black) in the wound bed.  Wound Description (Comments): 2 cm x 1.5 cm eschar  Present on Admission: Yes     Pressure Injury 10/31/20 Buttocks Right;Lower Unstageable - Full thickness tissue loss in which the base of the injury is covered by slough (yellow, tan, gray, green or brown) and/or eschar (tan, brown or black) in the wound bed. 1 cm x 1 cm eschar (Active)  10/31/20 1500  Location: Buttocks  Location Orientation: Right;Lower  Staging: Unstageable - Full thickness tissue loss in which the base of the injury is covered by slough (yellow, tan, gray, green or brown) and/or eschar (tan, brown or black) in the wound bed.  Wound Description (Comments): 1 cm x  1 cm eschar  Present on Admission: Yes     Pressure Injury 10/31/20 Ischial tuberosity Left Unstageable - Full thickness tissue loss in which the base of the injury is covered by slough (yellow, tan, gray, green or brown) and/or eschar (tan, brown or black) in the wound bed. 2.5 cm x 1 cm eschar (Active)   10/31/20 1500  Location: Ischial tuberosity  Location Orientation: Left  Staging: Unstageable - Full thickness tissue loss in which the base of the injury is covered by slough (yellow, tan, gray, green or brown) and/or eschar (tan, brown or black) in the wound bed.  Wound Description (Comments): 2.5 cm x 1 cm eschar  Present on Admission: Yes     Pressure Injury 10/31/20 Heel Posterior;Right Unstageable - Full thickness tissue loss in which the base of the injury is covered by slough (yellow, tan, gray, green or brown) and/or eschar (tan, brown or black) in the wound bed. eschar (Active)  10/31/20 1500  Location: Heel  Location Orientation: Posterior;Right  Staging: Unstageable - Full thickness tissue loss in which the base of the injury is covered by slough (yellow, tan, gray, green or brown) and/or eschar (tan, brown or black) in the wound bed.  Wound Description (Comments): eschar  Present on Admission: Yes     Pressure Injury 11/25/20 Thigh Right;Medial Stage 3 -  Full thickness tissue loss. Subcutaneous fat may be visible but bone, tendon or muscle are NOT exposed. (Active)  11/25/20 1945  Location: Thigh  Location Orientation: Right;Medial  Staging: Stage 3 -  Full thickness tissue loss. Subcutaneous fat may be visible but bone, tendon or muscle are NOT exposed.  Wound Description (Comments):   Present on Admission: Yes     Pressure Injury Flank Left Unstageable - Full thickness tissue loss in which the base of the injury is covered by slough (yellow, tan, gray, green or brown) and/or eschar (tan, brown or black) in the wound bed. (Active)     Location: Flank  Location Orientation: Left  Staging: Unstageable - Full thickness tissue loss in which the base of the injury is covered by slough (yellow, tan, gray, green or brown) and/or eschar (tan, brown or black) in the wound bed.  Wound Description (Comments):   Present on Admission: Yes     Pressure Injury 11/28/20 Buttocks Left  Unstageable - Full thickness tissue loss in which the base of the injury is covered by slough (yellow, tan, gray, green or brown) and/or eschar (tan, brown or black) in the wound bed. (Active)  11/28/20 0826  Location: Buttocks  Location Orientation: Left  Staging: Unstageable - Full thickness tissue loss in which the base of the injury is covered by slough (yellow, tan, gray, green or brown) and/or eschar (tan, brown or black) in the wound bed.  Wound Description (Comments):   Present on Admission:      Pressure Injury 11/28/20 Buttocks Left Unstageable - Full thickness tissue loss in which the base of the injury is covered by slough (yellow, tan, gray, green or brown) and/or eschar (tan, brown or black) in the wound bed. (Active)  11/28/20 0826  Location: Buttocks  Location Orientation: Left  Staging: Unstageable - Full thickness tissue loss in which the base of the injury is covered by slough (yellow, tan, gray, green or brown) and/or eschar (tan, brown or black) in the wound bed.  Wound Description (Comments):   Present on Admission: Yes     Pressure Injury 11/28/20 Hip Left Unstageable - Full thickness  tissue loss in which the base of the injury is covered by slough (yellow, tan, gray, green or brown) and/or eschar (tan, brown or black) in the wound bed. (Active)  11/28/20 0827  Location: Hip  Location Orientation: Left  Staging: Unstageable - Full thickness tissue loss in which the base of the injury is covered by slough (yellow, tan, gray, green or brown) and/or eschar (tan, brown or black) in the wound bed.  Wound Description (Comments):   Present on Admission: Yes     Nutrition Problem: Increased nutrient needs Etiology: chronic illness (ESRD on HD, wound healing)   DVT prophylaxis: IV heparin   Code Status:     Code Status Orders  (From admission, onward)           Start     Ordered   11/25/20 1633  Full code  Continuous        11/25/20 1633            Code Status History     Date Active Date Inactive Code Status Order ID Comments User Context   10/31/2020 1232 11/02/2020 2305 Full Code [id number]  Ortencia Kick Inpatient   09/29/2020 1730 10/03/2020 2303 Full Code [id number]  Eulis Foster, MD ED   09/12/2020 1152 09/14/2020 1801 Full Code [id number]  Angelia Mould, MD Inpatient   07/01/2020 0210 07/15/2020 1754 Full Code [id number]  Rise Patience, MD ED      Family Communication: No family at bedside Disposition Plan:  Status is: Inpatient  Remains inpatient appropriate because:Ongoing diagnostic testing needed not appropriate for outpatient work up, IV treatments appropriate due to intensity of illness or inability to take PO, and Inpatient level of care appropriate due to severity of illness  Dispo: The patient is from: Home              Anticipated d/c is to: SNF              Patient currently is not medically stable to d/c.   Difficult to place patient No      Consultants:  Vascular surgery Nephrology  Procedures:  Status postdebridement of the right thigh, right TMA 6/20  Antimicrobials:  Anti-infectives (From admission, onward)    Start     Dose/Rate Route Frequency Ordered Stop   11/26/20 1700  ceFEPIme (MAXIPIME) 1 g in sodium chloride 0.9 % 100 mL IVPB        1 g 200 mL/hr over 30 Minutes Intravenous Every 24 hours 11/25/20 1713     11/26/20 1200  vancomycin (VANCOREADY) IVPB 750 mg/150 mL        750 mg 150 mL/hr over 60 Minutes Intravenous Every T-Th-Sa (Hemodialysis) 11/25/20 1714     11/26/20 1126  vancomycin (VANCOREADY) 750 MG/150ML IVPB       Note to Pharmacy: Cherylann Banas   : cabinet override      11/26/20 1126 11/26/20 1200   11/26/20 1125  vancomycin (VANCOREADY) 750 MG/150ML IVPB       Note to Pharmacy: Cherylann Banas   : cabinet override      11/26/20 1125 11/26/20 1159   11/25/20 1700  vancomycin (VANCOREADY) IVPB 1250 mg/250 mL        1,250 mg 166.7 mL/hr over 90  Minutes Intravenous  Once 11/25/20 1645 11/26/20 0053   11/25/20 1645  ceFEPIme (MAXIPIME) 2 g in sodium chloride 0.9 % 100 mL IVPB        2 g 200 mL/hr over 30  Minutes Intravenous  Once 11/25/20 1644 11/25/20 2255   11/25/20 1500  clindamycin (CLEOCIN) IVPB 600 mg        600 mg 100 mL/hr over 30 Minutes Intravenous  Once 11/25/20 1459 11/25/20 1606        Objective: Vitals:   11/28/20 1130 11/28/20 1145 11/28/20 1200 11/28/20 1234  BP: 136/64 130/61 (!) 129/59 137/61  Pulse: 71 71 71 73  Resp: 17 19 15 20   Temp: 97.8 F (36.6 C)  97.8 F (36.6 C) 97.8 F (36.6 C)  TempSrc:    Oral  SpO2: 98% 100% 97% 100%  Weight:      Height:        Intake/Output Summary (Last 24 hours) at 11/28/2020 1359 Last data filed at 11/28/2020 1200 Gross per 24 hour  Intake 975.5 ml  Output 50 ml  Net 925.5 ml    Filed Weights   11/27/20 0500 11/28/20 0427 11/28/20 0845  Weight: 61 kg 60 kg 60 kg   Examination: General exam: Appears calm and comfortable  Respiratory system: Clear to auscultation. Respiratory effort normal.  On room air Cardiovascular system: S1 & S2 heard, RRR. + pedal edema. Gastrointestinal system: Abdomen is nondistended, soft and nontender. Normal bowel sounds heard. Central nervous system: Alert and oriented. Non focal exam. Speech clear  Extremities: Ischemic toes bilaterally Psychiatry: Judgement and insight appear stable. Mood & affect appropriate.    Data Reviewed: I have personally reviewed following labs and imaging studies  CBC: Recent Labs  Lab 11/25/20 1412 11/25/20 1512 11/26/20 0550 11/27/20 0202 11/28/20 0233  WBC 19.0*  --  15.9* 16.3* 13.0*  NEUTROABS 14.3*  --   --  12.3*  --   HGB 10.3* 9.2* 8.1* 8.4* 7.9*  HCT 34.0* 27.0* 26.7* 27.7* 25.6*  MCV 106.9*  --  105.1* 106.9* 107.6*  PLT 374  --  247 228 299    Basic Metabolic Panel: Recent Labs  Lab 11/25/20 1412 11/25/20 1512 11/26/20 0550 11/27/20 0202 11/28/20 0233  NA 134* 135  134* 137 137  K 3.5 3.3* 4.3 3.7 3.0*  CL 97* 100 100 97* 101  CO2 22  --  21* 26 24  GLUCOSE 153* 112* 90 111* 144*  BUN 22 26* 28* 11 24*  CREATININE 3.97* 3.80* 4.23* 2.68* 3.87*  CALCIUM 8.7*  --  8.2* 7.6* 7.8*    GFR: Estimated Creatinine Clearance: 11.7 mL/min (A) (by C-G formula based on SCr of 3.87 mg/dL (H)). Liver Function Tests: Recent Labs  Lab 11/25/20 1412  AST 24  ALT 14  ALKPHOS 209*  BILITOT 0.6  PROT 6.1*  ALBUMIN 1.9*    No results for input(s): LIPASE, AMYLASE in the last 168 hours. No results for input(s): AMMONIA in the last 168 hours. Coagulation Profile: Recent Labs  Lab 11/25/20 1412 11/28/20 0233  INR 2.3* 1.3*    Cardiac Enzymes: No results for input(s): CKTOTAL, CKMB, CKMBINDEX, TROPONINI in the last 168 hours. BNP (last 3 results) No results for input(s): PROBNP in the last 8760 hours. HbA1C: No results for input(s): HGBA1C in the last 72 hours. CBG: Recent Labs  Lab 11/27/20 1636 11/27/20 2058 11/28/20 0619 11/28/20 0844 11/28/20 1134  GLUCAP 81 143* 149* 109* 95    Lipid Profile: No results for input(s): CHOL, HDL, LDLCALC, TRIG, CHOLHDL, LDLDIRECT in the last 72 hours. Thyroid Function Tests: No results for input(s): TSH, T4TOTAL, FREET4, T3FREE, THYROIDAB in the last 72 hours. Anemia Panel: No results for input(s): VITAMINB12, FOLATE,  FERRITIN, TIBC, IRON, RETICCTPCT in the last 72 hours. Sepsis Labs: Recent Labs  Lab 11/25/20 1406 11/25/20 2302 11/26/20 0550 11/27/20 0202  LATICACIDVEN 4.6* 3.8* 2.1* 2.4*     Recent Results (from the past 240 hour(s))  Resp Panel by RT-PCR (Flu A&B, Covid) Nasopharyngeal Swab     Status: None   Collection Time: 11/25/20  2:06 PM   Specimen: Nasopharyngeal Swab; Nasopharyngeal(NP) swabs in vial transport medium  Result Value Ref Range Status   SARS Coronavirus 2 by RT PCR NEGATIVE NEGATIVE Final    Comment: (NOTE) SARS-CoV-2 target nucleic acids are NOT DETECTED.  The  SARS-CoV-2 RNA is generally detectable in upper respiratory specimens during the acute phase of infection. The lowest concentration of SARS-CoV-2 viral copies this assay can detect is 138 copies/mL. A negative result does not preclude SARS-Cov-2 infection and should not be used as the sole basis for treatment or other patient management decisions. A negative result may occur with  improper specimen collection/handling, submission of specimen other than nasopharyngeal swab, presence of viral mutation(s) within the areas targeted by this assay, and inadequate number of viral copies(<138 copies/mL). A negative result must be combined with clinical observations, patient history, and epidemiological information. The expected result is Negative.  Fact Sheet for Patients:  EntrepreneurPulse.com.au  Fact Sheet for Healthcare Providers:  IncredibleEmployment.be  This test is no t yet approved or cleared by the [place] FDA and  has been authorized for detection and/or diagnosis of SARS-CoV-2 by FDA under an Emergency Use Authorization (EUA). This EUA will remain  in effect (meaning this test can be used) for the duration of the COVID-19 declaration under Section 564(b)(1) of the Act, 21 U.S.C.section 360bbb-3(b)(1), unless the authorization is terminated  or revoked sooner.       Influenza A by PCR NEGATIVE NEGATIVE Final   Influenza B by PCR NEGATIVE NEGATIVE Final    Comment: (NOTE) The Xpert Xpress SARS-CoV-2/FLU/RSV plus assay is intended as an aid in the diagnosis of influenza from Nasopharyngeal swab specimens and should not be used as a sole basis for treatment. Nasal washings and aspirates are unacceptable for Xpert Xpress SARS-CoV-2/FLU/RSV testing.  Fact Sheet for Patients: EntrepreneurPulse.com.au  Fact Sheet for Healthcare Providers: IncredibleEmployment.be  This test is not yet approved or  cleared by the [place] FDA and has been authorized for detection and/or diagnosis of SARS-CoV-2 by FDA under an Emergency Use Authorization (EUA). This EUA will remain in effect (meaning this test can be used) for the duration of the COVID-19 declaration under Section 564(b)(1) of the Act, 21 U.S.C. section 360bbb-3(b)(1), unless the authorization is terminated or revoked.  Performed at [facility], [place] [address], [place], [place] [address]   Blood Culture (routine x 2)     Status: None (Preliminary result)   Collection Time: 11/25/20 11:01 PM   Specimen: BLOOD RIGHT HAND  Result Value Ref Range Status   Specimen Description BLOOD RIGHT HAND  Final   Special Requests   Final    BOTTLES DRAWN AEROBIC ONLY Blood Culture results may not be optimal due to an inadequate volume of blood received in culture bottles   Culture   Final    NO GROWTH 2 DAYS Performed at [facility], [place] [address] [place], [place] [address]    Report Status PENDING  Incomplete       Radiology Studies: No results found.    Scheduled Meds:  (feeding supplement) PROSource Plus  30 mL Oral  BID BM   amiodarone  200 mg Oral Daily   vitamin C  500 mg Oral BID   aspirin EC  81 mg Oral Daily   atorvastatin  20 mg Oral Daily   chlorhexidine       Chlorhexidine Gluconate Cloth  6 each Topical Q0600   famotidine  20 mg Oral Daily   feeding supplement (NEPRO CARB STEADY)  237 mL Oral BID BM   folic acid  1 mg Oral Daily   insulin aspart  0-6 Units Subcutaneous TID WC   linagliptin  5 mg Oral Daily   multivitamin  1 tablet Oral QHS   nutrition supplement (JUVEN)  1 packet Oral BID BM   sevelamer carbonate  800 mg Oral TID WC   Continuous Infusions:  sodium chloride     sodium chloride     ceFEPime (MAXIPIME) IV 1 g (11/27/20 1717)   heparin 1,000 Units/hr (11/27/20 0555)   sodium thiosulfate infusion for calciphylaxis 25 g (11/26/20 1354)   vancomycin 750 mg (11/26/20 1353)      LOS: 3 days      Time spent: 20 minutes   Dessa Phi, DO [facility] 11/28/2020, 1:59 PM   Available via Epic secure chat 7am-7pm After these hours, please refer to coverage provider listed on [email or web address]

## 2020-11-28 NOTE — Progress Notes (Signed)
VASCULAR SURGERY:  She is on the schedule today for debridement of her right thigh wound.  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  She has excellent Doppler signals in her feet.  The toes are demarcating and have improved since I saw her last.  However on the right side she has some slight erythema.  My concern with doing bilateral transmetatarsal amputations is that she will essentially be wheelchair-bound for some time.  Alternatively I think we could do a right transmetatarsal amputation and have her get physical therapy to learn to use a Darco shoe on the right and be partial weightbearing on the right.  Once this is healed we could proceed with left TMA unless the toes have continued to auto amputate.  I have discussed this with her and she is agreeable with this plan.  Gae Gallop, MD 7:38 AM

## 2020-11-28 NOTE — Anesthesia Procedure Notes (Signed)
Procedure Name: LMA Insertion Date/Time: 11/28/2020 10:22 AM Performed by: Trinna Post., CRNA Pre-anesthesia Checklist: Patient identified, Emergency Drugs available, Suction available, Patient being monitored and Timeout performed Patient Re-evaluated:Patient Re-evaluated prior to induction Oxygen Delivery Method: Circle system utilized Preoxygenation: Pre-oxygenation with 100% oxygen Induction Type: IV induction Ventilation: Mask ventilation without difficulty LMA: LMA inserted LMA Size: 4.0 Number of attempts: 1 Placement Confirmation: positive ETCO2 and breath sounds checked- equal and bilateral Tube secured with: Tape Dental Injury: Teeth and Oropharynx as per pre-operative assessment

## 2020-11-28 NOTE — Anesthesia Postprocedure Evaluation (Signed)
Anesthesia Post Note  Patient: Maille Halliwell  Procedure(s) Performed: IRRIGATION AND DEBRIDEMENT RIGHT UPPER LEG WOUND (Right) RIGHT TRANSMETATARSAL AMPUTATION (Right: Toe)     Patient location during evaluation: PACU Anesthesia Type: General Level of consciousness: awake and alert, patient cooperative and oriented Pain management: pain level controlled Vital Signs Assessment: post-procedure vital signs reviewed and stable Respiratory status: spontaneous breathing, nonlabored ventilation, respiratory function stable and patient connected to nasal cannula oxygen Cardiovascular status: blood pressure returned to baseline and stable Postop Assessment: no apparent nausea or vomiting Anesthetic complications: no   No notable events documented.  Last Vitals:  Vitals:   11/28/20 1200 11/28/20 1234  BP: (!) 129/59 137/61  Pulse: 71 73  Resp: 15 20  Temp: 36.6 C 36.6 C  SpO2: 97% 100%    Last Pain:  Vitals:   11/28/20 1234  TempSrc: Oral  PainSc: 5                  Doloros Kwolek,E. Heidie Krall

## 2020-11-28 NOTE — Transfer of Care (Signed)
Immediate Anesthesia Transfer of Care Note  Patient: Kathryn Hancock  Procedure(s) Performed: IRRIGATION AND DEBRIDEMENT RIGHT UPPER LEG WOUND (Right) RIGHT TRANSMETATARSAL AMPUTATION (Right: Toe)  Patient Location: PACU  Anesthesia Type:General  Level of Consciousness: awake and drowsy  Airway & Oxygen Therapy: Patient Spontanous Breathing and Patient connected to face mask oxygen  Post-op Assessment: Report given to RN and Post -op Vital signs reviewed and stable  Post vital signs: Reviewed and stable  Last Vitals:  Vitals Value Taken Time  BP 136/64 11/28/20 1131  Temp    Pulse 70 11/28/20 1132  Resp 19 11/28/20 1132  SpO2 98 % 11/28/20 1132  Vitals shown include unvalidated device data.  Last Pain:  Vitals:   11/28/20 0918  TempSrc:   PainSc: 4       Patients Stated Pain Goal: 3 (19/41/74 0814)  Complications: No notable events documented.

## 2020-11-28 NOTE — Progress Notes (Signed)
Orthopedic Tech Progress Note Patient Details:  Kathryn Hancock [birth year] [id number]  PACU RN called requesting a DARCO SHOE  Ortho Devices Type of Ortho Device: Darco shoe Ortho Device/Splint Location: RLE Ortho Device/Splint Interventions: Application, Ordered   Post Interventions Patient Tolerated: Well Instructions Provided: Care of device  Janit Pagan 11/28/2020, 12:33 PM

## 2020-11-28 NOTE — Progress Notes (Signed)
Pt arrived to unit from PACU, A/O x 4, oriented to unit,Will continue to monitor.   Albin Felling Aleana Fifita, RN     11/28/20 1234  Vitals  Temp 97.8 F (36.6 C)  Temp Source Oral  BP 137/61  MAP (mmHg) 83  BP Location Right Arm  BP Method Automatic  Patient Position (if appropriate) Lying  Pulse Rate 73  Pulse Rate Source Monitor  ECG Heart Rate 72  Resp 20  Level of Consciousness  Level of Consciousness Alert  Oxygen Therapy  SpO2 100 % (100)  O2 Device Room Air  O2 Flow Rate (L/min) 0 L/min  Pain Assessment  Pain Scale 0-10  Pain Score 5

## 2020-11-28 NOTE — Op Note (Signed)
    NAME: [name]    MRN: [id number] DOB: 1951-01-17    DATE OF OPERATION: 11/28/2020  PREOP DIAGNOSIS:    Nonhealing right thigh wound Gangrene toes bilateral feet  POSTOP DIAGNOSIS:    Same  PROCEDURE:    Right transmetatarsal amputation Excisional debridement right thigh vein harvest site  SURGEON: Judeth Cornfield. Scot Dock, MD  ASSIST: None  ANESTHESIA: General  EBL: Minimal  INDICATIONS:    Kathryn Hancock is a 70 y.o. female who underwent a right femoral to above-knee popliteal artery bypass with a vein graft.  She developed some dehiscence of her vein harvest incision in the medial right thigh and is set up for debridement.  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  I recommended initially a right transmetatarsal amputation.  FINDINGS:   Good bleeding at the right transmetatarsal amputation site.  TECHNIQUE:   The patient was taken to the operating room and received a general anesthetic.  The right leg was prepped and draped in usual sterile fashion.  Attention was first turned to the right foot for a right transmetatarsal amputation.  A fishmouth incision was made encompassing the toes.  Using a scalpel the incision was carried down through the skin and subcutaneous tissue and tendons to the metatarsal bones.  The periosteum was elevated on all 5 metatarsal bones of the bone divided using a CD4 saw.  The forefoot was then excised.  Hemostasis was obtained using electrocautery.  The tendons were retracted into the wound and divided.  The wound was then irrigated.  The deep layer was closed with interrupted 3-0 Vicryl's.  The skin was closed with staples.  A sterile dressing was applied.  Attention was then turned to debridement of the wounds in the medial right thigh.  Using a 10 blade I excised skin at both incisions where the wound had dehisced.   There was significant devitalized tissue here.  I then  using Metzenbaum scissors debrided subcutaneous fat and necrotic tissue.  The distal wound was 5 cm in length by 3 cm in width.  The proximal wound was likewise 5 cm in length and 3 cm in width.  The wound was debrided back to healthy tissue and then both wounds were packed with moist 4 x 4's.  Sterile dressing was applied.  The patient tolerated procedure well and was transferred to the recovery room in stable condition.   Deitra Mayo, MD, FACS [facility]  DATE OF DICTATION:   11/28/2020

## 2020-11-28 NOTE — Consult Note (Signed)
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 Scot Dock notation included She is on the schedule today for debridement of her right thigh wound.  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  She has excellent Doppler signals in her feet.  The toes are demarcating and have improved since I saw her last.  However on the right side she has some slight erythema.  My concern with doing bilateral transmetatarsal amputations is that she will essentially be wheelchair-bound for some time.  Alternatively I think we could do a right transmetatarsal amputation and have her get physical therapy to learn to use a Darco shoe on the right and be partial weightbearing on the right.  Once this is healed we could proceed with left TMA unless the toes have continued to auto amputate.  I have discussed this with her and she is agreeable with this plan. 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 Would question dx of calciphylaxis without bx.  Measurement: see nursing flow sheets; updated locations on FS per WOC Wound bed: all wounds on the hips/flank/buttocks are 100% necrotic  Drainage (amount, consistency, odor) none Periwound:intact  Dressing procedure/placement/frequency: Noted in VVS consult note that the ulcerations we are consulted for are thought to be calciphylaxis.  I am not able to find any bx results to confirm this. She is a ESRD patient and  the ulcers are common in this patient population however the location lends itself also to possible pressure. Nephrology leaning towards dx of calciphylaxis. Calciphylaxis is a tough thing to treat, we typically lean towards leaving them alone as long as the patient is not getting sick from the ulcerations (infections).  The wounds are all dry eschar.  I will add for staff to paint them with betadine daily and allow to air dry. This will keep them hard and firm.  The nephrology team is treating with sodium thiosulfate which may aid in the wounds improving or at least not declining or new ulcerations from forming.   VVS is managing the wound care for the right inner thigh and the feet.   Discussed POC with bedside nurse.  Re consult if needed, will not follow at this time. Thanks  Meyah Corle [employer], RN,CWOCN, CNS, [place] [phone])

## 2020-11-29 ENCOUNTER — Encounter (HOSPITAL_COMMUNITY): Payer: Self-pay | Admitting: Vascular Surgery

## 2020-11-29 LAB — CBC
HCT: 26.6 % — ABNORMAL LOW (ref 36.0–46.0)
Hemoglobin: 8.2 g/dL — ABNORMAL LOW (ref 12.0–15.0)
MCH: 32.8 pg (ref 26.0–34.0)
MCHC: 30.8 g/dL (ref 30.0–36.0)
MCV: 106.4 fL — ABNORMAL HIGH (ref 80.0–100.0)
Platelets: 197 10*3/uL (ref 150–400)
RBC: 2.5 MIL/uL — ABNORMAL LOW (ref 3.87–5.11)
RDW: 21.6 % — ABNORMAL HIGH (ref 11.5–15.5)
WBC: 15.7 10*3/uL — ABNORMAL HIGH (ref 4.0–10.5)
nRBC: 0.1 % (ref 0.0–0.2)

## 2020-11-29 LAB — APTT
aPTT: 37 seconds — ABNORMAL HIGH (ref 24–36)
aPTT: 65 seconds — ABNORMAL HIGH (ref 24–36)

## 2020-11-29 LAB — GLUCOSE, CAPILLARY
Glucose-Capillary: 107 mg/dL — ABNORMAL HIGH (ref 70–99)
Glucose-Capillary: 140 mg/dL — ABNORMAL HIGH (ref 70–99)
Glucose-Capillary: 169 mg/dL — ABNORMAL HIGH (ref 70–99)
Glucose-Capillary: 233 mg/dL — ABNORMAL HIGH (ref 70–99)

## 2020-11-29 LAB — BASIC METABOLIC PANEL
Anion gap: 11 (ref 5–15)
BUN: 35 mg/dL — ABNORMAL HIGH (ref 8–23)
CO2: 23 mmol/L (ref 22–32)
Calcium: 8.2 mg/dL — ABNORMAL LOW (ref 8.9–10.3)
Chloride: 103 mmol/L (ref 98–111)
Creatinine, Ser: 4.66 mg/dL — ABNORMAL HIGH (ref 0.44–1.00)
GFR, Estimated: 10 mL/min — ABNORMAL LOW (ref >60)
Glucose, Bld: 147 mg/dL — ABNORMAL HIGH (ref 70–99)
Potassium: 3.3 mmol/L — ABNORMAL LOW (ref 3.5–5.1)
Sodium: 137 mmol/L (ref 135–145)

## 2020-11-29 LAB — HEPARIN LEVEL (UNFRACTIONATED): Heparin Unfractionated: >1.1 IU/mL — ABNORMAL HIGH (ref 0.30–0.70)

## 2020-11-29 LAB — VANCOMYCIN, RANDOM: Vancomycin Rm: 17

## 2020-11-29 MED ORDER — PROSOURCE PLUS PO LIQD
30.0000 mL | Freq: Three times a day (TID) | ORAL | Status: DC
  Administered 2020-11-29 – 2020-12-13 (×36): 30 mL via ORAL
  Filled 2020-11-29 (×33): qty 30

## 2020-11-29 MED ORDER — SODIUM CHLORIDE 0.9 % IV SOLN
2.0000 g | Freq: Once | INTRAVENOUS | Status: AC
  Administered 2020-11-29: 2 g via INTRAVENOUS
  Filled 2020-11-29: qty 2

## 2020-11-29 MED ORDER — ENSURE ENLIVE PO LIQD
237.0000 mL | Freq: Two times a day (BID) | ORAL | Status: DC
  Administered 2020-12-02 – 2020-12-05 (×2): 237 mL via ORAL

## 2020-11-29 NOTE — Progress Notes (Signed)
Mobility Specialist - Progress Note   11/29/20 1202  Mobility  Activity Refused mobility   Pt initially agreeing to sit up on edge of bed, but began to c/o 8/10 R foot pain once attempting to move and refused mobility. Will f/u as able.   Pricilla Handler Mobility Specialist Mobility Specialist Phone: [phone]

## 2020-11-29 NOTE — Progress Notes (Signed)
PT Cancellation Note  Patient Details Name: Mayce Noyes MRN: [id number] DOB: July 18, 1950   Cancelled Treatment:    Reason Eval/Treat Not Completed: Patient at procedure or test/unavailable Pt at HD and likely won't return to room until 1630/1700.  Will f/u as able.   Abran Richard, PT Acute Rehab Services Pager [phone] [facility] [place] 11/29/2020, 2:44 PM

## 2020-11-29 NOTE — Progress Notes (Signed)
[facility] Progress Note   Subjective:   Seen in room S/p R TMA  and R thigh excision yesterday with VVS For HD today C/o pain in RLE  Objective Vitals:   11/28/20 1935 11/28/20 2332 11/29/20 0300 11/29/20 0753  BP: (!) 101/57 (!) 107/50 (!) 107/53 131/60  Pulse: 76 67 71 70  Resp: 20 20 20 20   Temp: 98.4 F (36.9 C) 98.4 F (36.9 C) 98.4 F (36.9 C) 98.3 F (36.8 C)  TempSrc: Oral Oral Oral Oral  SpO2: 99% 98% 96% 97%  Weight:      Height:       Physical Exam General: Chronically ill appearing woman, NAD Heart: RRR; no murmur Lungs: CTA anteiorly Abdomen: soft, non-tender Extremities: 1+ pitting LE edema to knees. R TMA site bandaged. All L toes with dry gangrene. R thigh bandaged. Hip and sacrum not examined Dialysis Access: TDC in R chest, LUE AVF + bruit and sig hematoma  Additional Objective Labs: Basic Metabolic Panel: Recent Labs  Lab 11/27/20 0202 11/28/20 0233 11/29/20 0102  NA 137 137 137  K 3.7 3.0* 3.3*  CL 97* 101 103  CO2 26 24 23   GLUCOSE 111* 144* 147*  BUN 11 24* 35*  CREATININE 2.68* 3.87* 4.66*  CALCIUM 7.6* 7.8* 8.2*    Liver Function Tests: Recent Labs  Lab 11/25/20 1412  AST 24  ALT 14  ALKPHOS 209*  BILITOT 0.6  PROT 6.1*  ALBUMIN 1.9*    CBC: Recent Labs  Lab 11/25/20 1412 11/25/20 1512 11/26/20 0550 11/27/20 0202 11/28/20 0233 11/29/20 0102  WBC 19.0*  --  15.9* 16.3* 13.0* 15.7*  NEUTROABS 14.3*  --   --  12.3*  --   --   HGB 10.3*   < > 8.1* 8.4* 7.9* 8.2*  HCT 34.0*   < > 26.7* 27.7* 25.6* 26.6*  MCV 106.9*  --  105.1* 106.9* 107.6* 106.4*  PLT 374  --  247 228 185 197   < > = values in this interval not displayed.    Blood Culture    Component Value Date/Time   SDES BLOOD RIGHT HAND 11/25/2020 2301   SPECREQUEST  11/25/2020 2301    BOTTLES DRAWN AEROBIC ONLY Blood Culture results may not be optimal due to an inadequate volume of blood received in culture bottles   CULT  11/25/2020  2301    NO GROWTH 3 DAYS Performed at [facility], [place] [address], [place], [place] [address]    REPTSTATUS PENDING 11/25/2020 2301   Studies/Results: No results found.  Medications:  sodium chloride     sodium chloride     ceFEPime (MAXIPIME) IV     heparin 1,000 Units/hr (11/29/20 0942)   sodium thiosulfate infusion for calciphylaxis 25 g (11/26/20 1354)    (feeding supplement) PROSource Plus  30 mL Oral BID BM   amiodarone  200 mg Oral Daily   vitamin C  500 mg Oral BID   aspirin EC  81 mg Oral Daily   atorvastatin  20 mg Oral Daily   Chlorhexidine Gluconate Cloth  6 each Topical Q0600   famotidine  20 mg Oral Daily   feeding supplement (NEPRO CARB STEADY)  237 mL Oral BID BM   folic acid  1 mg Oral Daily   insulin aspart  0-6 Units Subcutaneous TID WC   linagliptin  5 mg Oral Daily   multivitamin  1 tablet Oral QHS   nutrition supplement (JUVEN)  1 packet  Oral BID BM   sevelamer carbonate  800 mg Oral TID WC    Dialysis Orders: TTS at [facility] 3:45hr, 250/500 (new cannulation protocol AVF), EDW 59.5kg, 3K/2.5Ca, AVF + TDC, no heparin - Venofer 100mg  x 5 ordered - Mircera 133mcg IV q 2 weeks (last 6/14)   Assessment/Plan:  Extensive gangrenous wounds (all toes, R thigh, R hip, entire sacrum, L hip): On Vanc/Cefepime. 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   Calciphylaxis wounds: B hip/sacrum. Plan to treat empirically with sodium thiosulfate and discontinue all Ca/Vit D/iron products.  ESRD:  Continue HD per TTS schedule - next HD 6/21. 4K 2Ca, 3.5h, 2-3L UF, No hep, Use TDC while inpatient  Hypertension/volume: HyPOtensive on admit - home meds on hold. Stable BPs now.  UF as tolerated.  Anemia: Hgb 8.4 - not due for ESA yet. 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  Metabolic bone disease: CorrCa ok, Phos 6.2. D/c Vit D3 and changed binder from Auryxia -> Renvela. Using 2Ca bath with dialysis.  Nutrition:  Alb very low, push protein   CAD  PAD  T2DM  A-fib on Eliquis: Heparin bridge for upcoming surgery.  GOC: Needs ongoing dialogue and will see how wounds progress.  Rexene Agent, MD  11/29/2020, 10:07 AM  [facility]

## 2020-11-29 NOTE — Progress Notes (Signed)
ANTICOAGULATION CONSULT NOTE   Pharmacy Consult for heparin Indication: atrial fibrillation  Allergies  Allergen Reactions   Penicillins Anaphylaxis    Anaphylaxis as a child    Patient Measurements: Height: 5\' 3"  (160 cm) Weight: 60 kg (132 lb 4.4 oz) IBW/kg (Calculated) : 52.4 Heparin Dosing Weight: 62.1kg  Vital Signs: Temp: 98.3 F (36.8 C) (06/21 0753) Temp Source: Oral (06/21 0753) BP: 131/60 (06/21 0753) Pulse Rate: 70 (06/21 0753)  Labs: Recent Labs    11/27/20 0202 11/27/20 1146 11/28/20 0233 11/29/20 0102  HGB 8.4*  --  7.9* 8.2*  HCT 27.7*  --  25.6* 26.6*  PLT 228  --  185 197  APTT 51* 76* 76* 37*  LABPROT  --   --  16.1*  --   INR  --   --  1.3*  --   HEPARINUNFRC >1.10*  --  >1.10* >1.10*  CREATININE 2.68*  --  3.87* 4.66*     Estimated Creatinine Clearance: 9.7 mL/min (A) (by C-G formula based on SCr of 4.66 mg/dL (H)).   Assessment: 70 y.o. female with h/o Afib, Eliquis on hold for procedures, pharmacy to manage heparin. Will monitor with aPTT until correlating with heparin levels. S/p R. femoral to above-knee popliteal artery bypass in May 2022.  However, surgical wound on the right thigh never healed despite wound care at home.  Wounds on bilateral hips have also become larger and deeper despite local wound care at home. Plan is for debridement of incisions today and potentially digital amputations.  Pt is s/p TMA 6/20. Ok to resume IV heparin per Dr Scot Dock. Plan to continue abx for 48 hrs post op. His random vanc level today is 17 so it would still be around 8 post HD today. Therefore, no further vanc is needed. We will give one more dose of cefepime post HD today.   Goal of Therapy:  APTT 66-102 Heparin level 0.3-0.7 units/ml Monitor platelets by anticoagulation protocol: Yes   Plan:  Restart Heparin 1000 units/hr Check 8 hr aPTT Check heparin level/aPTT daily while on heparin F/u resuming apixaban  Onnie Boer, PharmD, BCIDP, AAHIVP,  CPP Infectious Disease Pharmacist 11/29/2020 9:12 AM

## 2020-11-29 NOTE — Progress Notes (Signed)
PROGRESS NOTE    [name]  OXB:[id number] DOB: Aug 13, 1950 DOA: 11/25/2020 PCP: Gala Lewandowsky, MD     Brief Narrative:  Kathryn Hancock is a 70 y.o. female with medical history significant of PAD with status post left and right leg bypasses, chronic bilateral toes ischemia, unhealing wound of right thigh after right femoropopliteal bypass, ESRD on HD TTS, PAF on Eliquis, IDDM, HTN, CAD status post CABG, HTN, HLD, presented with worsening of gangrene-like changes on bilateral toes and on healing wound of right thigh, and unhealing ulcers of bilateral hips.  Vascular surgery consulted.  Patient underwent right TMA, excisional debridement of the right thigh with Dr. Scot Dock 6/20.  New events last 24 hours / Subjective: Asking for some pain medication.  No new complaints.  Assessment & Plan:   Active Problems:   ESRD (end stage renal disease) on dialysis ([place])   Pressure injury of skin   Limb ischemia   Nonhealing ulcer of the right medial thigh at the site of saphenectomy incision -Sepsis ruled out, afebrile, heart rate and respiratory rate within normal range on admission although she did have leukocytosis as well as lactic acidosis -Blood cultures negative to date -Continue vancomycin, cefepime  -Vascular surgery following, status post excisional debridement 6/20 -Continue IV heparin (Eliquis PTA).  She should be fine to transition back to Eliquis tomorrow  Calciphylaxis -Appreciate nephrology, sodium thiosulfate with dialysis and local wound care  PAD with bilateral gangrenous all 10 toes -Vascular surgery following -Continue aspirin, Lipitor -Status post right transmetatarsal amputation -Continue to monitor left toes, may need left TMA in the future  ESRD -HD TTS  Paroxysmal atrial fibrillation -Eliquis on hold, continue IV heparin -Continue amiodarone  Diabetes mellitus -Sliding scale insulin, Tradjenta     In agreement with assessment of the pressure ulcer as  below:  Pressure Injury Coccyx Medial Stage 3 -  Full thickness tissue loss. Subcutaneous fat may be visible but bone, tendon or muscle are NOT exposed. 2 cm x 1 cm shallow bed with yellow/white (Active)     Location: Coccyx  Location Orientation: Medial  Staging: Stage 3 -  Full thickness tissue loss. Subcutaneous fat may be visible but bone, tendon or muscle are NOT exposed.  Wound Description (Comments): 2 cm x 1 cm shallow bed with yellow/white  Present on Admission: Yes     Pressure Injury 10/31/20 Buttocks Right;Upper Unstageable - Full thickness tissue loss in which the base of the injury is covered by slough (yellow, tan, gray, green or brown) and/or eschar (tan, brown or black) in the wound bed. 2 cm x 1.5 cm eschar (Active)  10/31/20 1500  Location: Buttocks  Location Orientation: Right;Upper  Staging: Unstageable - Full thickness tissue loss in which the base of the injury is covered by slough (yellow, tan, gray, green or brown) and/or eschar (tan, brown or black) in the wound bed.  Wound Description (Comments): 2 cm x 1.5 cm eschar  Present on Admission: Yes     Pressure Injury 10/31/20 Buttocks Right;Lower Unstageable - Full thickness tissue loss in which the base of the injury is covered by slough (yellow, tan, gray, green or brown) and/or eschar (tan, brown or black) in the wound bed. 1 cm x 1 cm eschar (Active)  10/31/20 1500  Location: Buttocks  Location Orientation: Right;Lower  Staging: Unstageable - Full thickness tissue loss in which the base of the injury is covered by slough (yellow, tan, gray, green or brown) and/or eschar (tan, brown or black) in the  wound bed.  Wound Description (Comments): 1 cm x 1 cm eschar  Present on Admission: Yes     Pressure Injury 10/31/20 Ischial tuberosity Left Unstageable - Full thickness tissue loss in which the base of the injury is covered by slough (yellow, tan, gray, green or brown) and/or eschar (tan, brown or black) in the wound  bed. 2.5 cm x 1 cm eschar (Active)  10/31/20 1500  Location: Ischial tuberosity  Location Orientation: Left  Staging: Unstageable - Full thickness tissue loss in which the base of the injury is covered by slough (yellow, tan, gray, green or brown) and/or eschar (tan, brown or black) in the wound bed.  Wound Description (Comments): 2.5 cm x 1 cm eschar  Present on Admission: Yes     Pressure Injury 10/31/20 Heel Posterior;Right Unstageable - Full thickness tissue loss in which the base of the injury is covered by slough (yellow, tan, gray, green or brown) and/or eschar (tan, brown or black) in the wound bed. eschar (Active)  10/31/20 1500  Location: Heel  Location Orientation: Posterior;Right  Staging: Unstageable - Full thickness tissue loss in which the base of the injury is covered by slough (yellow, tan, gray, green or brown) and/or eschar (tan, brown or black) in the wound bed.  Wound Description (Comments): eschar  Present on Admission: Yes     Pressure Injury 11/25/20 Thigh Right;Medial Stage 3 -  Full thickness tissue loss. Subcutaneous fat may be visible but bone, tendon or muscle are NOT exposed. (Active)  11/25/20 1945  Location: Thigh  Location Orientation: Right;Medial  Staging: Stage 3 -  Full thickness tissue loss. Subcutaneous fat may be visible but bone, tendon or muscle are NOT exposed.  Wound Description (Comments):   Present on Admission: Yes     Pressure Injury Flank Left Unstageable - Full thickness tissue loss in which the base of the injury is covered by slough (yellow, tan, gray, green or brown) and/or eschar (tan, brown or black) in the wound bed. (Active)     Location: Flank  Location Orientation: Left  Staging: Unstageable - Full thickness tissue loss in which the base of the injury is covered by slough (yellow, tan, gray, green or brown) and/or eschar (tan, brown or black) in the wound bed.  Wound Description (Comments):   Present on Admission: Yes      Pressure Injury 11/28/20 Buttocks Left Unstageable - Full thickness tissue loss in which the base of the injury is covered by slough (yellow, tan, gray, green or brown) and/or eschar (tan, brown or black) in the wound bed. (Active)  11/28/20 0826  Location: Buttocks  Location Orientation: Left  Staging: Unstageable - Full thickness tissue loss in which the base of the injury is covered by slough (yellow, tan, gray, green or brown) and/or eschar (tan, brown or black) in the wound bed.  Wound Description (Comments):   Present on Admission:      Pressure Injury 11/28/20 Buttocks Left Unstageable - Full thickness tissue loss in which the base of the injury is covered by slough (yellow, tan, gray, green or brown) and/or eschar (tan, brown or black) in the wound bed. (Active)  11/28/20 0826  Location: Buttocks  Location Orientation: Left  Staging: Unstageable - Full thickness tissue loss in which the base of the injury is covered by slough (yellow, tan, gray, green or brown) and/or eschar (tan, brown or black) in the wound bed.  Wound Description (Comments):   Present on Admission: Yes  Pressure Injury 11/28/20 Hip Left Unstageable - Full thickness tissue loss in which the base of the injury is covered by slough (yellow, tan, gray, green or brown) and/or eschar (tan, brown or black) in the wound bed. (Active)  11/28/20 0827  Location: Hip  Location Orientation: Left  Staging: Unstageable - Full thickness tissue loss in which the base of the injury is covered by slough (yellow, tan, gray, green or brown) and/or eschar (tan, brown or black) in the wound bed.  Wound Description (Comments):   Present on Admission: Yes     Nutrition Problem: Moderate Malnutrition Etiology: chronic illness (PAD, ESRD on HD)   DVT prophylaxis: IV heparin   Code Status:     Code Status Orders  (From admission, onward)           Start     Ordered   11/25/20 1633  Full code  Continuous         11/25/20 1633           Code Status History     Date Active Date Inactive Code Status Order ID Comments User Context   10/31/2020 1232 11/02/2020 2305 Full Code [id number]  Ortencia Kick Inpatient   09/29/2020 1730 10/03/2020 2303 Full Code [id number]  Eulis [place], MD ED   09/12/2020 1152 09/14/2020 1801 Full Code [id number]  Angelia Mould, MD Inpatient   07/01/2020 0210 07/15/2020 1754 Full Code [id number]  Rise Patience, MD ED      Family Communication: No family at bedside Disposition Plan:  Status is: Inpatient  Remains inpatient appropriate because:Ongoing diagnostic testing needed not appropriate for outpatient work up, IV treatments appropriate due to intensity of illness or inability to take PO, and Inpatient level of care appropriate due to severity of illness  Dispo: The patient is from: Home              Anticipated d/c is to: SNF              Patient currently is not medically stable to d/c.   Difficult to place patient No      Consultants:  Vascular surgery Nephrology  Procedures:  Status postdebridement of the right thigh, right TMA 6/20  Antimicrobials:  Anti-infectives (From admission, onward)    Start     Dose/Rate Route Frequency Ordered Stop   11/29/20 1800  ceFEPIme (MAXIPIME) 2 g in sodium chloride 0.9 % 100 mL IVPB        2 g 200 mL/hr over 30 Minutes Intravenous  Once 11/29/20 0908     11/26/20 1700  ceFEPIme (MAXIPIME) 1 g in sodium chloride 0.9 % 100 mL IVPB  Status:  Discontinued        1 g 200 mL/hr over 30 Minutes Intravenous Every 24 hours 11/25/20 1713 11/29/20 0908   11/26/20 1200  vancomycin (VANCOREADY) IVPB 750 mg/150 mL  Status:  Discontinued        750 mg 150 mL/hr over 60 Minutes Intravenous Every T-Th-Sa (Hemodialysis) 11/25/20 1714 11/29/20 0909   11/26/20 1126  vancomycin (VANCOREADY) 750 MG/150ML IVPB       Note to Pharmacy: Cherylann Banas   : cabinet override      11/26/20 1126 11/26/20 1200    11/26/20 1125  vancomycin (VANCOREADY) 750 MG/150ML IVPB       Note to Pharmacy: Cherylann Banas   : cabinet override      11/26/20 1125 11/26/20 1159   11/25/20 1700  vancomycin (VANCOREADY) IVPB 1250  mg/250 mL        1,250 mg 166.7 mL/hr over 90 Minutes Intravenous  Once 11/25/20 1645 11/26/20 0053   11/25/20 1645  ceFEPIme (MAXIPIME) 2 g in sodium chloride 0.9 % 100 mL IVPB        2 g 200 mL/hr over 30 Minutes Intravenous  Once 11/25/20 1644 11/25/20 2255   11/25/20 1500  clindamycin (CLEOCIN) IVPB 600 mg        600 mg 100 mL/hr over 30 Minutes Intravenous  Once 11/25/20 1459 11/25/20 1606        Objective: Vitals:   11/29/20 1300 11/29/20 1330 11/29/20 1400 11/29/20 1430  BP: (!) 102/57 (!) 103/55 112/60 (!) 95/55  Pulse: 78 79 82 81  Resp:      Temp:      TempSrc:      SpO2:      Weight:      Height:        Intake/Output Summary (Last 24 hours) at 11/29/2020 1446 Last data filed at 11/29/2020 0757 Gross per 24 hour  Intake 630.28 ml  Output 150 ml  Net 480.28 ml    Filed Weights   11/28/20 0427 11/28/20 0845 11/29/20 1236  Weight: 60 kg 60 kg 60.5 kg   Examination: General exam: Appears calm and comfortable  Respiratory system: Clear to auscultation. Respiratory effort normal. Cardiovascular system: S1 & S2 heard, RRR. Gastrointestinal system: Abdomen is nondistended, soft and nontender. Normal bowel sounds heard. Central nervous system: Alert and oriented. Non focal exam. Speech clear  Extremities: Status post right TMA.  Right foot and right thigh wrapped with dry and clean dressing Psychiatry: Judgement and insight appear stable. Mood & affect appropriate.     Data Reviewed: I have personally reviewed following labs and imaging studies  CBC: Recent Labs  Lab 11/25/20 1412 11/25/20 1512 11/26/20 0550 11/27/20 0202 11/28/20 0233 11/29/20 0102  WBC 19.0*  --  15.9* 16.3* 13.0* 15.7*  NEUTROABS 14.3*  --   --  12.3*  --   --   HGB 10.3* 9.2* 8.1* 8.4*  7.9* 8.2*  HCT 34.0* 27.0* 26.7* 27.7* 25.6* 26.6*  MCV 106.9*  --  105.1* 106.9* 107.6* 106.4*  PLT 374  --  247 228 185 893    Basic Metabolic Panel: Recent Labs  Lab 11/25/20 1412 11/25/20 1512 11/26/20 0550 11/27/20 0202 11/28/20 0233 11/29/20 0102  NA 134* 135 134* 137 137 137  K 3.5 3.3* 4.3 3.7 3.0* 3.3*  CL 97* 100 100 97* 101 103  CO2 22  --  21* 26 24 23   GLUCOSE 153* 112* 90 111* 144* 147*  BUN 22 26* 28* 11 24* 35*  CREATININE 3.97* 3.80* 4.23* 2.68* 3.87* 4.66*  CALCIUM 8.7*  --  8.2* 7.6* 7.8* 8.2*    GFR: Estimated Creatinine Clearance: 9.7 mL/min (A) (by C-G formula based on SCr of 4.66 mg/dL (H)). Liver Function Tests: Recent Labs  Lab 11/25/20 1412  AST 24  ALT 14  ALKPHOS 209*  BILITOT 0.6  PROT 6.1*  ALBUMIN 1.9*    No results for input(s): LIPASE, AMYLASE in the last 168 hours. No results for input(s): AMMONIA in the last 168 hours. Coagulation Profile: Recent Labs  Lab 11/25/20 1412 11/28/20 0233  INR 2.3* 1.3*    Cardiac Enzymes: No results for input(s): CKTOTAL, CKMB, CKMBINDEX, TROPONINI in the last 168 hours. BNP (last 3 results) No results for input(s): PROBNP in the last 8760 hours. HbA1C: No results for input(s):  HGBA1C in the last 72 hours. CBG: Recent Labs  Lab 11/28/20 1134 11/28/20 1625 11/28/20 2032 11/29/20 0603 11/29/20 1120  GLUCAP 95 117* 151* 107* 140*    Lipid Profile: No results for input(s): CHOL, HDL, LDLCALC, TRIG, CHOLHDL, LDLDIRECT in the last 72 hours. Thyroid Function Tests: No results for input(s): TSH, T4TOTAL, FREET4, T3FREE, THYROIDAB in the last 72 hours. Anemia Panel: No results for input(s): VITAMINB12, FOLATE, FERRITIN, TIBC, IRON, RETICCTPCT in the last 72 hours. Sepsis Labs: Recent Labs  Lab 11/25/20 1406 11/25/20 2302 11/26/20 0550 11/27/20 0202  LATICACIDVEN 4.6* 3.8* 2.1* 2.4*     Recent Results (from the past 240 hour(s))  Resp Panel by RT-PCR (Flu A&B, Covid)  Nasopharyngeal Swab     Status: None   Collection Time: 11/25/20  2:06 PM   Specimen: Nasopharyngeal Swab; Nasopharyngeal(NP) swabs in vial transport medium  Result Value Ref Range Status   SARS Coronavirus 2 by RT PCR NEGATIVE NEGATIVE Final    Comment: (NOTE) SARS-CoV-2 target nucleic acids are NOT DETECTED.  The SARS-CoV-2 RNA is generally detectable in upper respiratory specimens during the acute phase of infection. The lowest concentration of SARS-CoV-2 viral copies this assay can detect is 138 copies/mL. A negative result does not preclude SARS-Cov-2 infection and should not be used as the sole basis for treatment or other patient management decisions. A negative result may occur with  improper specimen collection/handling, submission of specimen other than nasopharyngeal swab, presence of viral mutation(s) within the areas targeted by this assay, and inadequate number of viral copies(<138 copies/mL). A negative result must be combined with clinical observations, patient history, and epidemiological information. The expected result is Negative.  Fact Sheet for Patients:  EntrepreneurPulse.com.au  Fact Sheet for Healthcare Providers:  IncredibleEmployment.be  This test is no t yet approved or cleared by the [place] FDA and  has been authorized for detection and/or diagnosis of SARS-CoV-2 by FDA under an Emergency Use Authorization (EUA). This EUA will remain  in effect (meaning this test can be used) for the duration of the COVID-19 declaration under Section 564(b)(1) of the Act, 21 U.S.C.section 360bbb-3(b)(1), unless the authorization is terminated  or revoked sooner.       Influenza A by PCR NEGATIVE NEGATIVE Final   Influenza B by PCR NEGATIVE NEGATIVE Final    Comment: (NOTE) The Xpert Xpress SARS-CoV-2/FLU/RSV plus assay is intended as an aid in the diagnosis of influenza from Nasopharyngeal swab specimens and should not be  used as a sole basis for treatment. Nasal washings and aspirates are unacceptable for Xpert Xpress SARS-CoV-2/FLU/RSV testing.  Fact Sheet for Patients: EntrepreneurPulse.com.au  Fact Sheet for Healthcare Providers: IncredibleEmployment.be  This test is not yet approved or cleared by the [place] FDA and has been authorized for detection and/or diagnosis of SARS-CoV-2 by FDA under an Emergency Use Authorization (EUA). This EUA will remain in effect (meaning this test can be used) for the duration of the COVID-19 declaration under Section 564(b)(1) of the Act, 21 U.S.C. section 360bbb-3(b)(1), unless the authorization is terminated or revoked.  Performed at [facility], [place] [address] [place], [place] [address]   Blood Culture (routine x 2)     Status: None (Preliminary result)   Collection Time: 11/25/20 11:01 PM   Specimen: BLOOD RIGHT HAND  Result Value Ref Range Status   Specimen Description BLOOD RIGHT HAND  Final   Special Requests   Final    BOTTLES DRAWN AEROBIC ONLY Blood Culture results may  not be optimal due to an inadequate volume of blood received in culture bottles   Culture   Final    NO GROWTH 3 DAYS Performed at [facility], [place] [address], [place], [place] [address]    Report Status PENDING  Incomplete       Radiology Studies: No results found.    Scheduled Meds:  (feeding supplement) PROSource Plus  30 mL Oral TID BM   amiodarone  200 mg Oral Daily   vitamin C  500 mg Oral BID   aspirin EC  81 mg Oral Daily   atorvastatin  20 mg Oral Daily   Chlorhexidine Gluconate Cloth  6 each Topical Q0600   famotidine  20 mg Oral Daily   feeding supplement  237 mL Oral BID BM   folic acid  1 mg Oral Daily   insulin aspart  0-6 Units Subcutaneous TID WC   linagliptin  5 mg Oral Daily   multivitamin  1 tablet Oral QHS   sevelamer carbonate  800 mg Oral TID WC   Continuous Infusions:  sodium chloride      sodium chloride     ceFEPime (MAXIPIME) IV     heparin 1,000 Units/hr (11/29/20 0942)   sodium thiosulfate infusion for calciphylaxis 25 g (11/26/20 1354)     LOS: 4 days      Time spent: 20 minutes   Dessa Phi, DO [facility] 11/29/2020, 2:46 PM   Available via Epic secure chat 7am-7pm After these hours, please refer to coverage provider listed on [email or web address]

## 2020-11-29 NOTE — Progress Notes (Addendum)
Nutrition Follow-up  DOCUMENTATION CODES:   Non-severe (moderate) malnutrition in context of chronic illness  INTERVENTION:   -Increase 30 ml Prosource Plus TID, each supplement provides 100 kcals and 15 grams protein -D/c Nepro -D/c Juven -Ensure Enlive po BID, each supplement provides 350 kcal and 20 grams of protein  -Magic cup TID with meals, each supplement provides 290 kcal and 9 grams of protein  -Continue renal MVI daily -Downgrade diet to dysphagia 3 (advanced mechanical soft) diet for ease of intake  NUTRITION DIAGNOSIS:   Moderate Malnutrition related to chronic illness (PAD, ESRD on HD) as evidenced by mild fat depletion, moderate fat depletion, mild muscle depletion, moderate muscle depletion.  Ongoing  GOAL:   Patient will meet greater than or equal to 90% of their needs  Progressing   MONITOR:   PO intake, Supplement acceptance, Labs, Weight trends, Skin, I & O's  REASON FOR ASSESSMENT:   Consult Assessment of nutrition requirement/status  ASSESSMENT:   70 y.o. female with medical history significant of PAD s/p left and right leg bypasses, chronic bilateral toe ischemia, non-healing wound of right thigh after right femoropopliteal bypass, ESRD on HD TTS (since January), PAF on Eliquis, IDDM, HTN, CAD status post CABG, HTN, AAA, HLD, depression and recent COVID 19 who presented with worsening of gangrene-like changes on bilateral toes and on healing wound of right thigh and unhealing ulcers of bilateral hips.  6/20- s/p Right transmetatarsal amputation Excisional debridement right thigh vein harvest site  Reviewed I/O's: +386 ml x 24 hours and -567 ml since admission  UOP: 150 ml x 24 hours   Spoke with pt at bedside, who reports feeling a little better today. She endorses a poor appetite, which has been ongoing for several months. Pt shares she consumed 1.5 muffins this morning for breakfast. PTA pt consumes 2-3 meals per day; she shares that she does  not do a lot of cooking, as meal preparation is limited secondary to her stove being broken, and has been eating a lot of TV dinners because of this. She reports that her intake is very similar on HD days vs non-HD days (Breakfast: sausage biscuit with coffee; Lunch: [employer] hamburger and french fries; Dinner: frozen meal). She shares that she is having difficulty chewing food, as she left her dentures at home.   Reviewed wt hx; pt has experienced a 4.8% wt loss over the past 3 months, which is not significant for time frame. Pt unsure of UBW and does not know if she has lost weight "because they keep weighing me in kilograms". She does not know her dry weight. Per nephrology notes, EDW 59.5 kg.   Discussed with pt importance of good meal and supplement intake to promote healing. Pt with very limited acceptance of nutritional supplements. Per her report "they are all nasty". She shared that she was able to take the Prosource with a lot of encouragement and washing down with juice. Pt amenable to continuing this supplement and RD reinforced its importance. Will also try Ensure. Pt with poor oral intake and would benefit from nutrient dense supplement. One Ensure Enlive supplement provides 350 kcals, 20 grams protein, and 44-45 grams of carbohydrate vs one Glucerna shake supplement, which provides 220 kcals, 10 grams of protein, and 26 grams of carbohydrate. Given pt's hx of DM, RD will reassess adequacy of PO intake, CBGS, and adjust supplement regimen as appropriate at follow-up.    Medications reviewed and include folic acid, renvela, and 500 mg vitamin C BID.  Labs reviewed: K: 3.3, CBGS: 95-151 (inpatient orders for glycemic control are 0-6 units insulin aspart TID with meals and 5 mg linagliptin daily).    NUTRITION - FOCUSED PHYSICAL EXAM:  Flowsheet Row Most Recent Value  Orbital Region Mild depletion  Upper Arm Region Mild depletion  Thoracic and Lumbar Region No depletion  Buccal Region  Mild depletion  Temple Region Moderate depletion  Clavicle Bone Region No depletion  Clavicle and Acromion Bone Region No depletion  Scapular Bone Region No depletion  Dorsal Hand No depletion  Patellar Region Mild depletion  Anterior Thigh Region Mild depletion  Posterior Calf Region Mild depletion  Edema (RD Assessment) Mild  Hair Reviewed  Eyes Reviewed  Mouth Reviewed  Skin Reviewed  Nails Reviewed       Diet Order:   Diet Order             Diet renal/carb modified with fluid restriction Fluid restriction: 1200 mL Fluid; Room service appropriate? Yes; Fluid consistency: Thin  Diet effective now                   EDUCATION NEEDS:   Education needs have been addressed  Skin:  Skin Assessment: Skin Integrity Issues: Skin Integrity Issues:: Incisions, Unstageable, Stage III, Other (Comment) Stage III: rt thigh coccyx Unstageable: lt ischial tuberosity Incisions: clsoed rt foot, rt leg s/p rt TMA Other: necrotic lt toes  Last BM:  Unknown  Height:   Ht Readings from Last 1 Encounters:  11/28/20 5\' 3"  (1.6 m)    Weight:   Wt Readings from Last 1 Encounters:  11/28/20 60 kg    Ideal Body Weight:  52.3 kg  BMI:  Body mass index is 23.43 kg/m.  Estimated Nutritional Needs:   Kcal:  1900-2100  Protein:  105-120 grams  Fluid:  1000 ml + UOP    Loistine Chance, RD, LDN, CDCES Registered Dietitian II Certified Diabetes Care and Education Specialist Please refer to [facility] for RD and/or RD on-call/weekend/after hours pager

## 2020-11-29 NOTE — Progress Notes (Signed)
ANTICOAGULATION CONSULT NOTE   Pharmacy Consult for heparin Indication: atrial fibrillation  Allergies  Allergen Reactions   Penicillins Anaphylaxis    Anaphylaxis as a child    Patient Measurements: Height: 5\' 3"  (160 cm) Weight: 60.5 kg (133 lb 6.1 oz) IBW/kg (Calculated) : 52.4 Heparin Dosing Weight: 62.1kg  Vital Signs: Temp: 98.4 F (36.9 C) (06/21 1236) Temp Source: Oral (06/21 1236) BP: 109/57 (06/21 1530) Pulse Rate: 85 (06/21 1530)  Labs: Recent Labs    11/27/20 0202 11/27/20 1146 11/28/20 0233 11/29/20 0102 11/29/20 1700  HGB 8.4*  --  7.9* 8.2*  --   HCT 27.7*  --  25.6* 26.6*  --   PLT 228  --  185 197  --   APTT 51*   < > 76* 37* 65*  LABPROT  --   --  16.1*  --   --   INR  --   --  1.3*  --   --   HEPARINUNFRC >1.10*  --  >1.10* >1.10*  --   CREATININE 2.68*  --  3.87* 4.66*  --    < > = values in this interval not displayed.     Estimated Creatinine Clearance: 9.7 mL/min (A) (by C-G formula based on SCr of 4.66 mg/dL (H)).   Assessment: 70 y.o. female with h/o Afib, Eliquis on hold for procedures, pharmacy to manage heparin. Will monitor with aPTT until correlating with heparin levels. S/p R. femoral to above-knee popliteal artery bypass in May 2022.  However, surgical wound on the right thigh never healed despite wound care at home.  Wounds on bilateral hips have also become larger and deeper despite local wound care at home. Plan is for debridement of incisions today and potentially digital amputations.  Pt is s/p TMA 6/20. Ok to resume IV heparin per Dr Scot Dock.  PTT 65 sec (slightly subtherapetuic) on gtt at 1000 units/hr.No issues with line or bleeding reported per RN.  Goal of Therapy:  APTT 66-102 sec Heparin level 0.3-0.7 units/ml Monitor platelets by anticoagulation protocol: Yes   Plan:  Increase heparin to 1100 units/hr Check 8 hr aPTT and heparin level F/u resuming apixaban  Sherlon Handing, PharmD, BCPS Please see amion for  complete clinical pharmacist phone list 11/29/2020 5:42 PM

## 2020-11-29 NOTE — Progress Notes (Addendum)
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  She has now undergone a right transmetatarsal amputation and physical therapy is working with her to teach her how to use her Darco shoe so that she can be partial weightbearing on the right side heel only.  I think if she had bilateral TMA she would be wheelchair-bound.   I&D RIGHT SAPHENECTOMY SITE: She has an extensive wound here which will require meticulous wound care but should heal without any problem.  The bypass graft is vein and is deep to the muscle so there is no concern here that the graft is compromised.  NUTRITION: The patient has moderate malnutrition.  The nutrition team is following.  She is on p.o. supplements and if she continues to try to eat should meet 90% of her requirements.  SACRAL DECUBITUS: This patient has multiple wounds as documented by the wound care nurse on 11/29/2019.  She is on an inflatable bed to assist with healing.  DISPOSITION: I have discussed the situation with the daughter who at this point is agreeable for her to go to a temporary skilled nursing facility.  Given that she will need to continue to be on an air mattress with complicated wound care I think she should be considered for skilled nursing facility prior to discharge home.  I can follow the gangrene on the toes of the left foot as an outpatient.  I can also follow the wound in the thigh as an outpatient.  However currently I think she will need aggressive treatment still and also help to be sure she is receiving adequate nutrition.  Gae Gallop, MD 12:46 PM    11/29/2020 10:35 AM 1 Day Post-Op  Subjective:  pain in R leg and foot   Vitals:   11/29/20 0300 11/29/20 0753  BP: (!) 107/53 131/60  Pulse: 71 70  Resp: 20 20  Temp: 98.4 F (36.9 C) 98.3 F (36.8 C)  SpO2: 96% 97%   Physical Exam: General: Chronically ill appearing, not in  acute distress Cardiac:  regular  Lungs:  non labored Incisions: Right thigh saphenectomy incision sites well appearing, repacked with 4x4 gauze, abd and ACE wrap. Right TMA staples well approximated, some oozing bleeding from lateral staple line, flaps appear viable. Xeroform, 4 x4s, kerlix wrap applied Extremities:  well perfused and warm. Brisk Dp and PT signals bilaterally. Dry stable gangrene of left toes Neurologic: alert and oriented  CBC    Component Value Date/Time   WBC 15.7 (H) 11/29/2020 0102   RBC 2.50 (L) 11/29/2020 0102   HGB 8.2 (L) 11/29/2020 0102   HGB 9.8 (L) 05/26/2020 1158   HCT 26.6 (L) 11/29/2020 0102   HCT 29.6 (L) 05/26/2020 1158   PLT 197 11/29/2020 0102   PLT 286 05/26/2020 1158   MCV 106.4 (H) 11/29/2020 0102   MCV 100 (H) 05/26/2020 1158   MCH 32.8 11/29/2020 0102   MCHC 30.8 11/29/2020 0102   RDW 21.6 (H) 11/29/2020 0102   RDW 14.1 05/26/2020 1158   LYMPHSABS 2.0 11/27/2020 0202   MONOABS 0.9 11/27/2020 0202   EOSABS 0.2 11/27/2020 0202   BASOSABS 0.1 11/27/2020 0202    BMET    Component Value Date/Time   NA 137 11/29/2020 0102   NA 139 05/26/2020 1158   K 3.3 (L) 11/29/2020 0102   CL 103 11/29/2020 0102   CO2 23 11/29/2020 0102  GLUCOSE 147 (H) 11/29/2020 0102   BUN 35 (H) 11/29/2020 0102   BUN 75 (H) 05/26/2020 1158   CREATININE 4.66 (H) 11/29/2020 0102   CALCIUM 8.2 (L) 11/29/2020 0102   GFRNONAA 10 (L) 11/29/2020 0102   GFRAA 16 (L) 05/26/2020 1158    INR    Component Value Date/Time   INR 1.3 (H) 11/28/2020 0233     Intake/Output Summary (Last 24 hours) at 11/29/2020 1035 Last data filed at 11/29/2020 0757 Gross per 24 hour  Intake 435.78 ml  Output 200 ml  Net 235.78 ml     Assessment/Plan:  70 y.o. female is s/p Right TMA and excisional debridement of right thigh vein harvest site 1 Day Post-Op   Right TMA and vein harvest site well appearing. Dressings changed. 4x4 wet to dry packing, ABD ACE to right thigh. Right  TMA site well appearing, viable flaps. Xeroform, 4x4s, and kerlix wrap BLE well perfused with brisk DP/ PT signals. Dry gangrene of left toes stable Hemodynamically stable Medical management per primary team HD per Nephrology PT/ OT heal weight bearing only [place] [phone] 11/29/2020 10:35 AM

## 2020-11-30 LAB — BASIC METABOLIC PANEL
Anion gap: 10 (ref 5–15)
BUN: 17 mg/dL (ref 8–23)
CO2: 26 mmol/L (ref 22–32)
Calcium: 7.6 mg/dL — ABNORMAL LOW (ref 8.9–10.3)
Chloride: 100 mmol/L (ref 98–111)
Creatinine, Ser: 2.73 mg/dL — ABNORMAL HIGH (ref 0.44–1.00)
GFR, Estimated: 19 mL/min — ABNORMAL LOW (ref >60)
Glucose, Bld: 120 mg/dL — ABNORMAL HIGH (ref 70–99)
Potassium: 3.3 mmol/L — ABNORMAL LOW (ref 3.5–5.1)
Sodium: 136 mmol/L (ref 135–145)

## 2020-11-30 LAB — CBC
HCT: 22.7 % — ABNORMAL LOW (ref 36.0–46.0)
Hemoglobin: 7.1 g/dL — ABNORMAL LOW (ref 12.0–15.0)
MCH: 33.2 pg (ref 26.0–34.0)
MCHC: 31.3 g/dL (ref 30.0–36.0)
MCV: 106.1 fL — ABNORMAL HIGH (ref 80.0–100.0)
Platelets: 148 10*3/uL — ABNORMAL LOW (ref 150–400)
RBC: 2.14 MIL/uL — ABNORMAL LOW (ref 3.87–5.11)
RDW: 22 % — ABNORMAL HIGH (ref 11.5–15.5)
WBC: 15.4 10*3/uL — ABNORMAL HIGH (ref 4.0–10.5)
nRBC: 0.1 % (ref 0.0–0.2)

## 2020-11-30 LAB — CULTURE, BLOOD (ROUTINE X 2)
Culture: NO GROWTH
Special Requests: ADEQUATE

## 2020-11-30 LAB — GLUCOSE, CAPILLARY
Glucose-Capillary: 134 mg/dL — ABNORMAL HIGH (ref 70–99)
Glucose-Capillary: 111 mg/dL — ABNORMAL HIGH (ref 70–99)
Glucose-Capillary: 144 mg/dL — ABNORMAL HIGH (ref 70–99)
Glucose-Capillary: 202 mg/dL — ABNORMAL HIGH (ref 70–99)

## 2020-11-30 LAB — APTT: aPTT: 102 seconds — ABNORMAL HIGH (ref 24–36)

## 2020-11-30 LAB — HEPARIN LEVEL (UNFRACTIONATED): Heparin Unfractionated: 0.9 IU/mL — ABNORMAL HIGH (ref 0.30–0.70)

## 2020-11-30 MED ORDER — APIXABAN 5 MG PO TABS
5.0000 mg | ORAL_TABLET | Freq: Two times a day (BID) | ORAL | Status: DC
  Administered 2020-11-30 – 2020-12-13 (×26): 5 mg via ORAL
  Filled 2020-11-30 (×26): qty 1

## 2020-11-30 NOTE — Progress Notes (Signed)
ANTICOAGULATION CONSULT NOTE   Pharmacy Consult for heparin Indication: atrial fibrillation  Allergies  Allergen Reactions   Penicillins Anaphylaxis    Anaphylaxis as a child    Patient Measurements: Height: 5\' 3"  (160 cm) Weight: 59 kg (130 lb 1.1 oz) IBW/kg (Calculated) : 52.4 Heparin Dosing Weight: 62.1kg  Vital Signs: Temp: 98.1 F (36.7 C) (06/21 2312) Temp Source: Oral (06/21 2312) BP: 101/49 (06/21 2312) Pulse Rate: 70 (06/21 2312)  Labs: Recent Labs    11/28/20 0233 11/29/20 0102 11/29/20 1700 11/30/20 0156  HGB 7.9* 8.2*  --  7.1*  HCT 25.6* 26.6*  --  22.7*  PLT 185 197  --  148*  APTT 76* 37* 65* 102*  LABPROT 16.1*  --   --   --   INR 1.3*  --   --   --   HEPARINUNFRC >1.10* >1.10*  --  0.90*  CREATININE 3.87* 4.66*  --  2.73*     Estimated Creatinine Clearance: 16.5 mL/min (A) (by C-G formula based on SCr of 2.73 mg/dL (H)).   Assessment: 70 y.o. female with h/o Afib, Eliquis on hold for procedures, pharmacy to manage heparin. Will monitor with aPTT until correlating with heparin levels. S/p R. femoral to above-knee popliteal artery bypass in May 2022.  However, surgical wound on the right thigh never healed despite wound care at home.  Wounds on bilateral hips have also become larger and deeper despite local wound care at home. Plan is for debridement of incisions today and potentially digital amputations.  Pt is s/p TMA 6/20. Ok to resume IV heparin per Dr Scot Dock.  PTT 65 sec (slightly subtherapetuic) on gtt at 1000 units/hr.No issues with line or bleeding reported per RN.  6/22 AM update:  aPTT therapeutic  Hgb dow some>>watch closely   Goal of Therapy:  APTT 66-102 sec Heparin level 0.3-0.7 units/ml Monitor platelets by anticoagulation protocol: Yes   Plan:  Cont heparin 1100 units/hr 1200 aPTT and heparin level Trend Hgb  Narda Bonds, PharmD, BCPS Clinical Pharmacist Phone: [phone]

## 2020-11-30 NOTE — NC FL2 (Addendum)
[place] LEVEL OF CARE SCREENING TOOL     IDENTIFICATION  Patient Name: Kathryn Hancock Birthdate: [birth year] Sex: female Admission Date (Current Location): 11/25/2020  [facility] and [place] Number:  [employer] and Address:  [facility], [place] [address], [place], [place] [address]      Provider Number: [id number]  Attending Physician Name and Address:  Dessa Phi, DO  Relative Name and Phone Number:       Current Level of Care: Hospital Recommended Level of Care: [place] Prior Approval Number:    Date Approved/Denied:   PASRR Number: [id number]  Discharge Plan: SNF    Current Diagnoses: Patient Active Problem List   Diagnosis Date Noted   Limb ischemia 11/25/2020   Pressure injury of skin 11/01/2020   Iron deficiency anemia, unspecified 10/22/2020   Malnutrition of moderate degree 09/30/2020   Current use of long term anticoagulation    Weakness    Anemia 09/29/2020   Personal history of COVID-19 09/28/2020   COVID-19 08/22/2020   Hyperlipidemia associated with type 2 diabetes mellitus ([place]) 08/05/2020   Hypokalemia 07/27/2020   Unspecified abnormal findings in urine 07/26/2020   Unspecified severe protein-calorie malnutrition ([place]) 07/21/2020   Encounter for immunization 07/19/2020   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[place]) 07/16/2020   Coagulation defect, unspecified ([place]) 07/16/2020   Allergy, unspecified, initial encounter 07/15/2020   Anaphylactic shock, unspecified, initial encounter 07/15/2020   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   Pain, unspecified 07/15/2020   Pruritus, unspecified 07/15/2020   Secondary hyperparathyroidism of renal origin ([place]) 07/15/2020   Shortness of breath 07/15/2020   PAOD (peripheral arterial occlusive disease) ([place]) 07/08/2020   ESRD (end stage renal disease) on dialysis ([place]) 07/01/2020   PAF (paroxysmal atrial fibrillation) (HCC) -  CHA2DS2-VASc Score 5 (Age, Female,  HTN, DM, Vascular)- On Eliquis 07/01/2020   Anemia of chronic disease 07/01/2020   Essential hypertension 07/01/2020   DM (diabetes mellitus), type 2 with renal complications ([place]) 25/95/6387   Cellulitis of toe 06/05/2020   S/P CABG (coronary artery bypass graft) 06/02/2020   Coronary artery disease involving native coronary artery of native heart without angina pectoris 06/01/2020   CKD (chronic kidney disease) stage 3, GFR 30-59 ml/min ([place]) 06/01/2020   HLD (hyperlipidemia) 06/01/2020   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 05/27/2020   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   Tobacco abuse 12/10/2019   AAA (abdominal aortic aneurysm) without rupture ([place]) 12/10/2019    Orientation RESPIRATION BLADDER Height & Weight     Self, Time, Situation, Place  Normal Continent, External catheter Weight: 158 lb 11.7 oz (72 kg) Height:  5\' 3"  (160 cm)  BEHAVIORAL SYMPTOMS/MOOD NEUROLOGICAL BOWEL NUTRITION STATUS      Continent Diet (please see discharge summary)  AMBULATORY STATUS COMMUNICATION OF NEEDS Skin   Extensive Assist   Surgical wounds (Pressure Injury (PI)-RT Thigh Medical Stage 3, PI Flank LF unstageable, PI Buttocks LF & RT unstageable, PI LF Hip Unstageable, PI Coccyx Medial Stage 3, Closed incision RT Foot, Closed incision RT leg, Wound/incision anterior LF, all toes)                       Personal Care Assistance Level of Assistance  Bathing, Feeding, Dressing Bathing Assistance: Limited assistance Feeding assistance: Independent Dressing Assistance: Limited assistance     Functional Limitations Info  Sight, Hearing, Speech Sight Info: Adequate Hearing Info: Adequate Speech Info: Adequate    SPECIAL CARE FACTORS FREQUENCY  PT (By licensed PT), OT (By licensed OT)     PT Frequency: 5x per week OT Frequency: 5x per week            Contractures Contractures Info: Not present    Additional Factors Info  Code Status, Allergies, Insulin Sliding Scale Code Status  Info: FULL Allergies Info: Penicillins   Insulin Sliding Scale Info: see discharge summary       Current Medications (11/30/2020):  This is the current hospital active medication list Current Facility-Administered Medications  Medication Dose Route Frequency Provider Last Rate Last Admin   (feeding supplement) PROSource Plus liquid 30 mL  30 mL Oral TID BM Dessa Phi, DO   30 mL at 11/30/20 0272   diphenhydrAMINE (BENADRYL) capsule 25 mg  25 mg Oral QHS PRN Angelia Mould, MD   25 mg at 11/29/20 2011   And   acetaminophen (TYLENOL) tablet 500 mg  500 mg Oral QHS PRN Angelia Mould, MD       acetaminophen (TYLENOL) tablet 650 mg  650 mg Oral Q8H PRN Angelia Mould, MD   650 mg at 11/29/20 2011   amiodarone (PACERONE) tablet 200 mg  200 mg Oral Daily Angelia Mould, MD   200 mg at 11/30/20 0820   apixaban (ELIQUIS) tablet 5 mg  5 mg Oral BID Pham, Minh Q, RPH-CPP   5 mg at 11/30/20 5366   ascorbic acid (VITAMIN C) tablet 500 mg  500 mg Oral BID Angelia Mould, MD   500 mg at 11/30/20 0820   aspirin EC tablet 81 mg  81 mg Oral Daily Angelia Mould, MD   81 mg at 11/30/20 0820   atorvastatin (LIPITOR) tablet 20 mg  20 mg Oral Daily Angelia Mould, MD   20 mg at 11/30/20 0820   bisacodyl (DULCOLAX) EC tablet 5 mg  5 mg Oral Daily PRN Angelia Mould, MD   5 mg at 11/28/20 1521   Chlorhexidine Gluconate Cloth 2 % PADS 6 each  6 each Topical Q0600 Angelia Mould, MD   6 each at 11/30/20 0449   diclofenac Sodium (VOLTAREN) 1 % topical gel 1 application  1 application Topical QID PRN Angelia Mould, MD       docusate sodium (COLACE) capsule 100 mg  100 mg Oral Daily PRN Angelia Mould, MD       famotidine (PEPCID) tablet 20 mg  20 mg Oral Daily Angelia Mould, MD   20 mg at 11/30/20 0820   feeding supplement (ENSURE ENLIVE / ENSURE PLUS) liquid 237 mL  237 mL Oral BID BM Dessa Phi, DO        fluticasone (FLONASE) 50 MCG/ACT nasal spray 2 spray  2 spray Each Nare PRN Angelia Mould, MD       folic acid (FOLVITE) tablet 1 mg  1 mg Oral Daily Angelia Mould, MD   1 mg at 11/30/20 0820   HYDROmorphone (DILAUDID) injection 0.5-1 mg  0.5-1 mg Intravenous Q2H PRN Angelia Mould, MD   1 mg at 11/30/20 0820   insulin aspart (novoLOG) injection 0-6 Units  0-6 Units Subcutaneous TID WC Angelia Mould, MD   1 Units at 11/27/20 1226   linagliptin (TRADJENTA) tablet 5 mg  5 mg Oral Daily Angelia Mould, MD   5 mg at 11/30/20 0820   montelukast (SINGULAIR) tablet 10 mg  10 mg Oral Daily PRN Angelia Mould, MD   10  mg at 11/26/20 0947   multivitamin (RENA-VIT) tablet 1 tablet  1 tablet Oral QHS Angelia Mould, MD   1 tablet at 11/29/20 2012   ondansetron [facility]) injection 4 mg  4 mg Intravenous Q6H PRN Angelia Mould, MD   4 mg at 11/26/20 1201   oxyCODONE (Oxy IR/ROXICODONE) immediate release tablet 5 mg  5 mg Oral Q6H PRN Angelia Mould, MD   5 mg at 11/30/20 1600   sevelamer carbonate (RENVELA) tablet 800 mg  800 mg Oral TID WC Angelia Mould, MD   800 mg at 11/30/20 0820   sodium thiosulfate 25 g in sodium chloride 0.9 % 200 mL Infusion for Calciphylaxis  25 g Intravenous Q T,Th,Sa-HD Angelia Mould, MD 200 mL/hr at 11/29/20 1456 25 g at 11/29/20 1456     Discharge Medications: Please see discharge summary for a list of discharge medications.  Relevant Imaging Results:  Relevant Lab Results:   Additional Information SSN # [id number]  Moderna COVID-19 Vaccine 11/20/2019 , 10/22/2019 no booster shot   Dialysis T/TH/SAT @ 7:30 am  Vinie Sill, LCSW

## 2020-11-30 NOTE — TOC Initial Note (Addendum)
Transition of Care [facility]) - Initial/Assessment Note    Patient Details  Name: Kathryn Hancock MRN: [id number] Date of Birth: Mar 26, 1951  [facility] CM/SW Contact:    Vinie Sill, LCSW Phone Number: 11/30/2020, 3:56 PM  Clinical Narrative:                  CSW met with patient at bedside. CSW introduced self and explained role. CSW discussed short term rehab at SNF(pending recommendation). Patient she is in the home with her daughter and two grandsons. Patient acknowledges the need for therapy and is agreeable to short term rehab at [facility]. Patient expressed concerns about losing income if she was to discharge to SNF- CSW explained she has medicare that could cover rehab 100% up to 20 days, etc. CSW informed of the SNF process. Patient states her daughter transports her to [facility] for dialysis on  T/TH/Sat  at 7:30am. Patient states her preferred SNF is [facility]. Patient has received covid vaccine but no booster shot. Patient states no other questions or concerns.   CSW spoke with patient's daughter Juliann Pulse via phone. She is agrees with disposition plan. No questions or concerns noted at this time.   CSW will provide bed offers once available.  CSW will continue to follow and assist with discharge planning.  Thurmond Butts, MSW, LCSW Clinical Social Worker    Expected Discharge Plan: Skilled Nursing Facility Barriers to Discharge: Continued Medical Work up, SNF Pending bed offer   Patient Goals and CMS Choice        Expected Discharge Plan and Services Expected Discharge Plan: [place] In-house Referral: Clinical Social Work                                            Prior Living Arrangements/Services   Lives with:: Self Patient language and need for interpreter reviewed:: No        Need for Family Participation in Patient Care: Yes (Comment) Care giver support system in place?: Yes (comment)   Criminal  Activity/Legal Involvement Pertinent to Current Situation/Hospitalization: No - Comment as needed  Activities of Daily Living      Permission Sought/Granted Permission sought to share information with : Family Supports, Chartered certified accountant granted to share information with : Yes, Verbal Permission Granted  Share Information with NAME: [name]  Permission granted to share info w AGENCY: SNFs  Permission granted to share info w Relationship: daughter  Permission granted to share info w Contact Information: [phone]  Emotional Assessment     Affect (typically observed): Accepting, Pleasant, Appropriate Orientation: : Oriented to Self, Oriented to Place, Oriented to  Time, Oriented to Situation Alcohol / Substance Use: Not Applicable Psych Involvement: No (comment)  Admission diagnosis:  ESRD (end stage renal disease) on dialysis ([place]) [N18.6, Z99.2] Cellulitis of right lower extremity [L03.115] Limb ischemia [I99.8] Patient Active Problem List   Diagnosis Date Noted   Limb ischemia 11/25/2020   Pressure injury of skin 11/01/2020   Iron deficiency anemia, unspecified 10/22/2020   Malnutrition of moderate degree 09/30/2020   Current use of long term anticoagulation    Weakness    Anemia 09/29/2020   Personal history of COVID-19 09/28/2020   COVID-19 08/22/2020   Hyperlipidemia associated with type 2 diabetes mellitus ([place]) 08/05/2020   Hypokalemia 07/27/2020   Unspecified abnormal findings in urine  07/26/2020   Unspecified severe protein-calorie malnutrition ([place]) 07/21/2020   Encounter for immunization 07/19/2020   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[place]) 07/16/2020   Coagulation defect, unspecified ([place]) 07/16/2020   Allergy, unspecified, initial encounter 07/15/2020   Anaphylactic shock, unspecified, initial encounter 07/15/2020   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   Pain, unspecified 07/15/2020   Pruritus, unspecified 07/15/2020   Secondary  hyperparathyroidism of renal origin ([place]) 07/15/2020   Shortness of breath 07/15/2020   PAOD (peripheral arterial occlusive disease) ([place]) 07/08/2020   ESRD (end stage renal disease) on dialysis ([place]) 07/01/2020   PAF (paroxysmal atrial fibrillation) (HCC) -  CHA2DS2-VASc Score 5 (Age, Female, HTN, DM, Vascular)- On Eliquis 07/01/2020   Anemia of chronic disease 07/01/2020   Essential hypertension 07/01/2020   DM (diabetes mellitus), type 2 with renal complications ([place]) 64/38/3779   Cellulitis of toe 06/05/2020   S/P CABG (coronary artery bypass graft) 06/02/2020   Coronary artery disease involving native coronary artery of native heart without angina pectoris 06/01/2020   CKD (chronic kidney disease) stage 3, GFR 30-59 ml/min ([place]) 06/01/2020   HLD (hyperlipidemia) 06/01/2020   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 [place] [address] Phone: [phone] Fax: [phone]  [facility] [place], [place] - [address] [place] [address] Phone: [phone] Fax: [phone]  [facility] [address] [place] [address] Phone: [phone] Fax: [phone]     Social Determinants of Health (SDOH) Interventions    Readmission Risk Interventions Readmission Risk Prevention Plan 11/02/2020  Transportation Screening Complete  Medication Review Press photographer) Complete  HRI or [place] Complete  SW Recovery Care/Counseling Consult Complete  Palliative Care Screening Not [place] Not Applicable

## 2020-11-30 NOTE — Care Management Important Message (Signed)
Important Message  Patient Details  Name: Kathryn Hancock MRN: [id number] Date of Birth: 1950-07-10   Medicare Important Message Given:  Yes     Colum Colt [address] 11/30/2020, 4:10 PM

## 2020-11-30 NOTE — Progress Notes (Addendum)
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  She has now undergone a right transmetatarsal amputation and physical therapy is working with her to teach her how to use her Darco shoe so that she can be partial weightbearing on the right side heel only. Dr. Scot Dock feels if she had bilateral TMA she would be wheelchair-bound.   I&D RIGHT SAPHENECTOMY SITE: VSS. Afebrile. BC negative times 3 days. WBCs 15.4  She has an extensive wound here which will require meticulous wound care but should heal without any problem.  The bypass graft is vein and is deep to the muscle so there is no concern here that the graft is compromised. Continue W>D dressing changes BID.  ESRD: s/p LUE BVT. Currently dialyzing via [place]. LUE edematous and bruised>recommend resting AVF.  History of pararenal abdominal aortic aneurysm followed by Dr. Scot Dock as an outpatient.  Per his assessment, should this enlarge she would require a fenestrated graft versus a thoracoabdominal repair and would have to be referred to [facility].  History of A. fib: on heparin infusion. Management per primary service.   NUTRITION: The patient has moderate malnutrition.  The nutrition team is following.  She is on p.o. supplements and if she continues to try to eat should meet 90% of her requirements.   SACRAL DECUBITUS: This patient has multiple wounds as documented by the wound care nurse on 11/29/2019.  She is on an inflatable bed to assist with healing.   DISPOSITION:  Dr. Scot Dock discussed the situation with the daughter who at this point is agreeable for her to go to a temporary skilled nursing facility.  Given that she will need to continue to be on an air mattress with complicated wound care, she should be considered for skilled nursing facility prior to discharge home.  Will follow the gangrene on the toes of the left foot as an outpatient as well as the  wound in the thigh.  However currently she will need aggressive treatment still and also help to be sure she is receiving adequate nutrition.    SUBJECTIVE:   Pain with dressing changes  PHYSICAL EXAM:   Vitals:   11/29/20 2000 11/29/20 2312 11/30/20 0407 11/30/20 0449  BP: (!) 101/49 (!) 101/49 (!) 106/46   Pulse:  70 86   Resp:  18 18   Temp:  98.1 F (36.7 C) 98.1 F (36.7 C)   TempSrc:  Oral Oral   SpO2:   96%   Weight:    72 kg  Height:       General: Chronically ill appearing, not in acute distress Cardiac:  regular  Lungs:  non labored Incisions: Right thigh saphenectomy incision sites dressings just changed by nursing and were left intact. Right TMA staples well approximated, some oozing bleeding from lateral staple line, flaps appear viable. Xeroform, 4 x4s, kerlix wrap applied Extremities:  well perfused and warm. Brisk Dp and PT signals bilaterally. Dry stable gangrene of left toes. LUE AVF with good thrill and bruit. Moderate edema and ecchymosis Neurologic: alert and oriented  LABS:   Lab Results  Component Value Date   WBC 15.4 (H) 11/30/2020   HGB 7.1 (L) 11/30/2020   HCT 22.7 (L) 11/30/2020   MCV 106.1 (H) 11/30/2020   PLT 148 (L) 11/30/2020   Lab Results  Component Value Date   CREATININE 2.73 (H) 11/30/2020   Lab Results  Component Value  Date   INR 1.3 (H) 11/28/2020   CBG (last 3)  Recent Labs    11/29/20 1652 11/29/20 2116 11/30/20 0619  GLUCAP 169* 233* 134*    PROBLEM LIST:    Active Problems:   ESRD (end stage renal disease) on dialysis (H[facility])   Pressure injury of skin   Limb ischemia   CURRENT MEDS:    (feeding supplement) PROSource Plus  30 mL Oral TID BM   amiodarone  200 mg Oral Daily   vitamin C  500 mg Oral BID   aspirin EC  81 mg Oral Daily   atorvastatin  20 mg Oral Daily   Chlorhexidine Gluconate Cloth  6 each Topical Q0600   famotidine  20 mg Oral Daily   feeding supplement  237 mL Oral BID BM   folic acid  1 mg  Oral Daily   insulin aspart  0-6 Units Subcutaneous TID WC   linagliptin  5 mg Oral Daily   multivitamin  1 tablet Oral QHS   sevelamer carbonate  800 mg Oral TID WC    Barbie Banner, PA-C  Office: [phone] 11/30/2020   I have interviewed the patient and examined the patient. I agree with the findings by the PA.  I inspected her right transmetatarsal amputation this morning and this is healing well so far.  Gae Gallop, MD

## 2020-11-30 NOTE — Progress Notes (Signed)
PROGRESS NOTE    Kathryn Hancock  WFU:[id number] DOB: January 21, 1951 DOA: 11/25/2020 PCP: Gala Lewandowsky, MD     Brief Narrative:  Kathryn Hancock is a 70 y.o. female with medical history significant of PAD with status post left and right leg bypasses, chronic bilateral toes ischemia, unhealing wound of right thigh after right femoropopliteal bypass, ESRD on HD TTS, PAF on Eliquis, IDDM, HTN, CAD status post CABG, HTN, HLD, presented with worsening of gangrene-like changes on bilateral toes and on healing wound of right thigh, and unhealing ulcers of bilateral hips.  Vascular surgery consulted.  Patient underwent right TMA, excisional debridement of the right thigh with Dr. Scot Dock 6/20.  New events last 24 hours / Subjective: Has not been able to work with PT.  Asking for pain medication.  Discussed with her that she may need SNF placement after discharge, has not been seen by PT yet.  She was quite tearful when talking about SNF, stated "I can't go if they take my check, I will lose my house".  Assessment & Plan:   Active Problems:   ESRD (end stage renal disease) on dialysis ([place])   Pressure injury of skin   Limb ischemia   Nonhealing ulcer of the right medial thigh at the site of saphenectomy incision -Sepsis ruled out, afebrile, heart rate and respiratory rate within normal range on admission although she did have leukocytosis as well as lactic acidosis -Blood cultures negative to date -Vascular surgery following, status post excisional debridement 6/20 -Continue Eliquis today  Calciphylaxis -Appreciate nephrology, sodium thiosulfate with dialysis and local wound care  PAD with bilateral gangrenous all 10 toes -Vascular surgery following -Continue aspirin, Lipitor -Status post right transmetatarsal amputation -Continue to monitor left toes, may need left TMA in the future  ESRD -HD TTS  Paroxysmal atrial fibrillation -Continue Eliquis -Continue amiodarone  Diabetes  mellitus -Sliding scale insulin, Tradjenta     In agreement with assessment of the pressure ulcer as below:  Pressure Injury Coccyx Medial Stage 3 -  Full thickness tissue loss. Subcutaneous fat may be visible but bone, tendon or muscle are NOT exposed. 2 cm x 1 cm shallow bed with yellow/white (Active)     Location: Coccyx  Location Orientation: Medial  Staging: Stage 3 -  Full thickness tissue loss. Subcutaneous fat may be visible but bone, tendon or muscle are NOT exposed.  Wound Description (Comments): 2 cm x 1 cm shallow bed with yellow/white  Present on Admission: Yes     Pressure Injury 10/31/20 Buttocks Right;Upper Unstageable - Full thickness tissue loss in which the base of the injury is covered by slough (yellow, tan, gray, green or brown) and/or eschar (tan, brown or black) in the wound bed. 2 cm x 1.5 cm eschar (Active)  10/31/20 1500  Location: Buttocks  Location Orientation: Right;Upper  Staging: Unstageable - Full thickness tissue loss in which the base of the injury is covered by slough (yellow, tan, gray, green or brown) and/or eschar (tan, brown or black) in the wound bed.  Wound Description (Comments): 2 cm x 1.5 cm eschar  Present on Admission: Yes     Pressure Injury 10/31/20 Buttocks Right;Lower Unstageable - Full thickness tissue loss in which the base of the injury is covered by slough (yellow, tan, gray, green or brown) and/or eschar (tan, brown or black) in the wound bed. 1 cm x 1 cm eschar (Active)  10/31/20 1500  Location: Buttocks  Location Orientation: Right;Lower  Staging: Unstageable - Full thickness tissue loss  in which the base of the injury is covered by slough (yellow, tan, gray, green or brown) and/or eschar (tan, brown or black) in the wound bed.  Wound Description (Comments): 1 cm x 1 cm eschar  Present on Admission: Yes     Pressure Injury 10/31/20 Heel Posterior;Right Unstageable - Full thickness tissue loss in which the base of the injury is  covered by slough (yellow, tan, gray, green or brown) and/or eschar (tan, brown or black) in the wound bed. eschar (Active)  10/31/20 1500  Location: Heel  Location Orientation: Posterior;Right  Staging: Unstageable - Full thickness tissue loss in which the base of the injury is covered by slough (yellow, tan, gray, green or brown) and/or eschar (tan, brown or black) in the wound bed.  Wound Description (Comments): eschar  Present on Admission: Yes     Pressure Injury 11/25/20 Thigh Right;Medial Stage 3 -  Full thickness tissue loss. Subcutaneous fat may be visible but bone, tendon or muscle are NOT exposed. (Active)  11/25/20 1945  Location: Thigh  Location Orientation: Right;Medial  Staging: Stage 3 -  Full thickness tissue loss. Subcutaneous fat may be visible but bone, tendon or muscle are NOT exposed.  Wound Description (Comments):   Present on Admission: Yes     Pressure Injury Flank Left Unstageable - Full thickness tissue loss in which the base of the injury is covered by slough (yellow, tan, gray, green or brown) and/or eschar (tan, brown or black) in the wound bed. (Active)     Location: Flank  Location Orientation: Left  Staging: Unstageable - Full thickness tissue loss in which the base of the injury is covered by slough (yellow, tan, gray, green or brown) and/or eschar (tan, brown or black) in the wound bed.  Wound Description (Comments):   Present on Admission: Yes     Pressure Injury 11/28/20 Buttocks Left Unstageable - Full thickness tissue loss in which the base of the injury is covered by slough (yellow, tan, gray, green or brown) and/or eschar (tan, brown or black) in the wound bed. (Active)  11/28/20 0826  Location: Buttocks  Location Orientation: Left  Staging: Unstageable - Full thickness tissue loss in which the base of the injury is covered by slough (yellow, tan, gray, green or brown) and/or eschar (tan, brown or black) in the wound bed.  Wound Description  (Comments):   Present on Admission:      Pressure Injury 11/28/20 Buttocks Right Unstageable - Full thickness tissue loss in which the base of the injury is covered by slough (yellow, tan, gray, green or brown) and/or eschar (tan, brown or black) in the wound bed. (Active)  11/28/20 0826  Location: Buttocks  Location Orientation: Right  Staging: Unstageable - Full thickness tissue loss in which the base of the injury is covered by slough (yellow, tan, gray, green or brown) and/or eschar (tan, brown or black) in the wound bed.  Wound Description (Comments):   Present on Admission: Yes     Pressure Injury 11/28/20 Hip Left Unstageable - Full thickness tissue loss in which the base of the injury is covered by slough (yellow, tan, gray, green or brown) and/or eschar (tan, brown or black) in the wound bed. (Active)  11/28/20 0827  Location: Hip  Location Orientation: Left  Staging: Unstageable - Full thickness tissue loss in which the base of the injury is covered by slough (yellow, tan, gray, green or brown) and/or eschar (tan, brown or black) in the wound bed.  Wound  Description (Comments):   Present on Admission: Yes     Nutrition Problem: Moderate Malnutrition Etiology: chronic illness (PAD, ESRD on HD)   DVT prophylaxis: apixaban (ELIQUIS) tablet 5 mg  Code Status:     Code Status Orders  (From admission, onward)           Start     Ordered   11/25/20 1633  Full code  Continuous        11/25/20 1633           Code Status History     Date Active Date Inactive Code Status Order ID Comments User Context   10/31/2020 1232 11/02/2020 2305 Full Code [id number]  Ortencia Kick Inpatient   09/29/2020 1730 10/03/2020 2303 Full Code [id number]  Eulis Foster, MD ED   09/12/2020 1152 09/14/2020 1801 Full Code [id number]  Angelia Mould, MD Inpatient   07/01/2020 0210 07/15/2020 1754 Full Code [id number]  Rise Patience, MD ED      Family Communication:  No family at bedside Disposition Plan:  Status is: Inpatient  Remains inpatient appropriate because:Unsafe d/c plan  Dispo: The patient is from: Home              Anticipated d/c is to: SNF              Patient currently is medically stable to d/c.  She needs PT evaluation, SNF placement.   Difficult to place patient No      Consultants:  Vascular surgery Nephrology  Procedures:  Status postdebridement of the right thigh, right TMA 6/20  Antimicrobials:  Anti-infectives (From admission, onward)    Start     Dose/Rate Route Frequency Ordered Stop   11/29/20 1800  ceFEPIme (MAXIPIME) 2 g in sodium chloride 0.9 % 100 mL IVPB        2 g 200 mL/hr over 30 Minutes Intravenous  Once 11/29/20 0908 11/29/20 2046   11/26/20 1700  ceFEPIme (MAXIPIME) 1 g in sodium chloride 0.9 % 100 mL IVPB  Status:  Discontinued        1 g 200 mL/hr over 30 Minutes Intravenous Every 24 hours 11/25/20 1713 11/29/20 0908   11/26/20 1200  vancomycin (VANCOREADY) IVPB 750 mg/150 mL  Status:  Discontinued        750 mg 150 mL/hr over 60 Minutes Intravenous Every T-Th-Sa (Hemodialysis) 11/25/20 1714 11/29/20 0909   11/26/20 1126  vancomycin (VANCOREADY) 750 MG/150ML IVPB       Note to Pharmacy: Cherylann Banas   : cabinet override      11/26/20 1126 11/26/20 1200   11/26/20 1125  vancomycin (VANCOREADY) 750 MG/150ML IVPB       Note to Pharmacy: Cherylann Banas   : cabinet override      11/26/20 1125 11/26/20 1159   11/25/20 1700  vancomycin (VANCOREADY) IVPB 1250 mg/250 mL        1,250 mg 166.7 mL/hr over 90 Minutes Intravenous  Once 11/25/20 1645 11/26/20 0053   11/25/20 1645  ceFEPIme (MAXIPIME) 2 g in sodium chloride 0.9 % 100 mL IVPB        2 g 200 mL/hr over 30 Minutes Intravenous  Once 11/25/20 1644 11/25/20 2255   11/25/20 1500  clindamycin (CLEOCIN) IVPB 600 mg        600 mg 100 mL/hr over 30 Minutes Intravenous  Once 11/25/20 1459 11/25/20 1606        Objective: Vitals:   11/29/20 2312  11/30/20 0407 11/30/20 0449  11/30/20 0817  BP: (!) 101/49 (!) 106/46  (!) 109/48  Pulse: 70 86  66  Resp: 18 18  18   Temp: 98.1 F (36.7 C) 98.1 F (36.7 C)  98.5 F (36.9 C)  TempSrc: Oral Oral  Oral  SpO2:  96%  100%  Weight:   72 kg   Height:        Intake/Output Summary (Last 24 hours) at 11/30/2020 1300 Last data filed at 11/30/2020 0206 Gross per 24 hour  Intake 570 ml  Output 1500 ml  Net -930 ml    Filed Weights   11/29/20 1236 11/29/20 1607 11/30/20 0449  Weight: 60.5 kg 59 kg 72 kg   Examination: General exam: Appears calm and comfortable  Respiratory system: Clear to auscultation. Respiratory effort normal. Cardiovascular system: S1 & S2 heard, RRR. No pedal edema. Gastrointestinal system: Abdomen is nondistended, soft and nontender. Normal bowel sounds heard. Central nervous system: Alert and oriented. Non focal exam. Speech clear  Extremities: Right TMA, left ischemic toes Psychiatry: Judgement and insight appear stable. Mood & affect appropriate.     Data Reviewed: I have personally reviewed following labs and imaging studies  CBC: Recent Labs  Lab 11/25/20 1412 11/25/20 1512 11/26/20 0550 11/27/20 0202 11/28/20 0233 11/29/20 0102 11/30/20 0156  WBC 19.0*  --  15.9* 16.3* 13.0* 15.7* 15.4*  NEUTROABS 14.3*  --   --  12.3*  --   --   --   HGB 10.3*   < > 8.1* 8.4* 7.9* 8.2* 7.1*  HCT 34.0*   < > 26.7* 27.7* 25.6* 26.6* 22.7*  MCV 106.9*  --  105.1* 106.9* 107.6* 106.4* 106.1*  PLT 374  --  247 228 185 197 148*   < > = values in this interval not displayed.    Basic Metabolic Panel: Recent Labs  Lab 11/26/20 0550 11/27/20 0202 11/28/20 0233 11/29/20 0102 11/30/20 0156  NA 134* 137 137 137 136  K 4.3 3.7 3.0* 3.3* 3.3*  CL 100 97* 101 103 100  CO2 21* 26 24 23 26   GLUCOSE 90 111* 144* 147* 120*  BUN 28* 11 24* 35* 17  CREATININE 4.23* 2.68* 3.87* 4.66* 2.73*  CALCIUM 8.2* 7.6* 7.8* 8.2* 7.6*    GFR: Estimated Creatinine Clearance:  19 mL/min (A) (by C-G formula based on SCr of 2.73 mg/dL (H)). Liver Function Tests: Recent Labs  Lab 11/25/20 1412  AST 24  ALT 14  ALKPHOS 209*  BILITOT 0.6  PROT 6.1*  ALBUMIN 1.9*    No results for input(s): LIPASE, AMYLASE in the last 168 hours. No results for input(s): AMMONIA in the last 168 hours. Coagulation Profile: Recent Labs  Lab 11/25/20 1412 11/28/20 0233  INR 2.3* 1.3*    Cardiac Enzymes: No results for input(s): CKTOTAL, CKMB, CKMBINDEX, TROPONINI in the last 168 hours. BNP (last 3 results) No results for input(s): PROBNP in the last 8760 hours. HbA1C: No results for input(s): HGBA1C in the last 72 hours. CBG: Recent Labs  Lab 11/29/20 0603 11/29/20 1120 11/29/20 1652 11/29/20 2116 11/30/20 0619  GLUCAP 107* 140* 169* 233* 134*    Lipid Profile: No results for input(s): CHOL, HDL, LDLCALC, TRIG, CHOLHDL, LDLDIRECT in the last 72 hours. Thyroid Function Tests: No results for input(s): TSH, T4TOTAL, FREET4, T3FREE, THYROIDAB in the last 72 hours. Anemia Panel: No results for input(s): VITAMINB12, FOLATE, FERRITIN, TIBC, IRON, RETICCTPCT in the last 72 hours. Sepsis Labs: Recent Labs  Lab 11/25/20 1406 11/25/20 2302 11/26/20 0550  11/27/20 0202  LATICACIDVEN 4.6* 3.8* 2.1* 2.4*     Recent Results (from the past 240 hour(s))  Blood Culture (routine x 2)     Status: None   Collection Time: 11/25/20  2:06 PM   Specimen: BLOOD  Result Value Ref Range Status   Specimen Description BLOOD RIGHT ANTECUBITAL  Final   Special Requests   Final    BOTTLES DRAWN AEROBIC AND ANAEROBIC Blood Culture adequate volume   Culture   Final    NO GROWTH 5 DAYS Performed at [facility], [address], [place], [place] [address]    Report Status 11/30/2020 FINAL  Final  Resp Panel by RT-PCR (Flu A&B, Covid) Nasopharyngeal Swab     Status: None   Collection Time: 11/25/20  2:06 PM   Specimen: Nasopharyngeal Swab; Nasopharyngeal(NP) swabs in vial  transport medium  Result Value Ref Range Status   SARS Coronavirus 2 by RT PCR NEGATIVE NEGATIVE Final    Comment: (NOTE) SARS-CoV-2 target nucleic acids are NOT DETECTED.  The SARS-CoV-2 RNA is generally detectable in upper respiratory specimens during the acute phase of infection. The lowest concentration of SARS-CoV-2 viral copies this assay can detect is 138 copies/mL. A negative result does not preclude SARS-Cov-2 infection and should not be used as the sole basis for treatment or other patient management decisions. A negative result may occur with  improper specimen collection/handling, submission of specimen other than nasopharyngeal swab, presence of viral mutation(s) within the areas targeted by this assay, and inadequate number of viral copies(<138 copies/mL). A negative result must be combined with clinical observations, patient history, and epidemiological information. The expected result is Negative.  Fact Sheet for Patients:  EntrepreneurPulse.com.au  Fact Sheet for Healthcare Providers:  IncredibleEmployment.be  This test is no t yet approved or cleared by the [place] FDA and  has been authorized for detection and/or diagnosis of SARS-CoV-2 by FDA under an Emergency Use Authorization (EUA). This EUA will remain  in effect (meaning this test can be used) for the duration of the COVID-19 declaration under Section 564(b)(1) of the Act, 21 U.S.C.section 360bbb-3(b)(1), unless the authorization is terminated  or revoked sooner.       Influenza A by PCR NEGATIVE NEGATIVE Final   Influenza B by PCR NEGATIVE NEGATIVE Final    Comment: (NOTE) The Xpert Xpress SARS-CoV-2/FLU/RSV plus assay is intended as an aid in the diagnosis of influenza from Nasopharyngeal swab specimens and should not be used as a sole basis for treatment. Nasal washings and aspirates are unacceptable for Xpert Xpress SARS-CoV-2/FLU/RSV testing.  Fact  Sheet for Patients: EntrepreneurPulse.com.au  Fact Sheet for Healthcare Providers: IncredibleEmployment.be  This test is not yet approved or cleared by the [place] FDA and has been authorized for detection and/or diagnosis of SARS-CoV-2 by FDA under an Emergency Use Authorization (EUA). This EUA will remain in effect (meaning this test can be used) for the duration of the COVID-19 declaration under Section 564(b)(1) of the Act, 21 U.S.C. section 360bbb-3(b)(1), unless the authorization is terminated or revoked.  Performed at [facility], [place] [address], [place], [place] [address]   Blood Culture (routine x 2)     Status: None (Preliminary result)   Collection Time: 11/25/20 11:01 PM   Specimen: BLOOD RIGHT HAND  Result Value Ref Range Status   Specimen Description BLOOD RIGHT HAND  Final   Special Requests   Final    BOTTLES DRAWN AEROBIC ONLY Blood Culture results may not  be optimal due to an inadequate volume of blood received in culture bottles   Culture   Final    NO GROWTH 4 DAYS Performed at [facility], [place] [address], [place], [place] [address]    Report Status PENDING  Incomplete       Radiology Studies: No results found.    Scheduled Meds:  (feeding supplement) PROSource Plus  30 mL Oral TID BM   amiodarone  200 mg Oral Daily   apixaban  5 mg Oral BID   vitamin C  500 mg Oral BID   aspirin EC  81 mg Oral Daily   atorvastatin  20 mg Oral Daily   Chlorhexidine Gluconate Cloth  6 each Topical Q0600   famotidine  20 mg Oral Daily   feeding supplement  237 mL Oral BID BM   folic acid  1 mg Oral Daily   insulin aspart  0-6 Units Subcutaneous TID WC   linagliptin  5 mg Oral Daily   multivitamin  1 tablet Oral QHS   sevelamer carbonate  800 mg Oral TID WC   Continuous Infusions:  sodium thiosulfate infusion for calciphylaxis 25 g (11/29/20 1456)     LOS: 5 days      Time spent: 20 minutes    Dessa Phi, DO [facility] 11/30/2020, 1:00 PM   Available via Epic secure chat 7am-7pm After these hours, please refer to coverage provider listed on [email or web address]

## 2020-11-30 NOTE — Progress Notes (Signed)
ANTICOAGULATION CONSULT NOTE   Pharmacy Consult for heparin>>apixaban Indication: atrial fibrillation  Allergies  Allergen Reactions   Penicillins Anaphylaxis    Anaphylaxis as a child    Patient Measurements: Height: 5\' 3"  (160 cm) Weight: 72 kg (158 lb 11.7 oz) IBW/kg (Calculated) : 52.4 Heparin Dosing Weight: 62.1kg  Vital Signs: Temp: 98.1 F (36.7 C) (06/22 0407) Temp Source: Oral (06/22 0407) BP: 106/46 (06/22 0407) Pulse Rate: 86 (06/22 0407)  Labs: Recent Labs    11/28/20 0233 11/29/20 0102 11/29/20 1700 11/30/20 0156  HGB 7.9* 8.2*  --  7.1*  HCT 25.6* 26.6*  --  22.7*  PLT 185 197  --  148*  APTT 76* 37* 65* 102*  LABPROT 16.1*  --   --   --   INR 1.3*  --   --   --   HEPARINUNFRC >1.10* >1.10*  --  0.90*  CREATININE 3.87* 4.66*  --  2.73*     Estimated Creatinine Clearance: 19 mL/min (A) (by C-G formula based on SCr of 2.73 mg/dL (H)).   Assessment: 70 y.o. female with h/o Afib, Eliquis on hold for procedures, pharmacy to manage heparin. Will monitor with aPTT until correlating with heparin levels. S/p R. femoral to above-knee popliteal artery bypass in May 2022.  However, surgical wound on the right thigh never healed despite wound care at home.  Wounds on bilateral hips have also become larger and deeper despite local wound care at home. Plan is for debridement of incisions today and potentially digital amputations.  Pt is s/p TMA 6/20. Ok to resume IV heparin per Dr Scot Dock.  PTT 65 sec (slightly subtherapetuic) on gtt at 1000 units/hr.No issues with line or bleeding reported per RN.  Transition back to apixaban today for afib. Age<80, wt>60kg, scr>1.5  Goal of Therapy:   Monitor platelets by anticoagulation protocol: Yes   Plan:  Dc heparin Apixaban 5mg  PO BID Rx will follow peripherally  Onnie Boer, PharmD, BCIDP, AAHIVP, CPP Infectious Disease Pharmacist 11/30/2020 8:05 AM

## 2020-11-30 NOTE — Discharge Instructions (Signed)

## 2020-11-30 NOTE — Progress Notes (Signed)
[facility] Progress Note   Subjective:   Seen in room, no c/o Will be for SNF at [facility] HD yesterday 1.5L UF  Objective Vitals:   11/29/20 2312 11/30/20 0407 11/30/20 0449 11/30/20 0817  BP: (!) 101/49 (!) 106/46  (!) 109/48  Pulse: 70 86  66  Resp: 18 18  18   Temp: 98.1 F (36.7 C) 98.1 F (36.7 C)  98.5 F (36.9 C)  TempSrc: Oral Oral  Oral  SpO2:  96%  100%  Weight:   72 kg   Height:       Physical Exam General: Chronically ill appearing woman, NAD Heart: RRR; no murmur Lungs: CTA anteiorly Abdomen: soft, non-tender Extremities: 1+ pitting LE edema to knees. R TMA site bandaged. All L toes with dry gangrene. R thigh bandaged. Hip and sacrum not examined Dialysis Access: TDC in R chest, LUE AVF + bruit and sig hematoma  Additional Objective Labs: Basic Metabolic Panel: Recent Labs  Lab 11/28/20 0233 11/29/20 0102 11/30/20 0156  NA 137 137 136  K 3.0* 3.3* 3.3*  CL 101 103 100  CO2 24 23 26   GLUCOSE 144* 147* 120*  BUN 24* 35* 17  CREATININE 3.87* 4.66* 2.73*  CALCIUM 7.8* 8.2* 7.6*    Liver Function Tests: Recent Labs  Lab 11/25/20 1412  AST 24  ALT 14  ALKPHOS 209*  BILITOT 0.6  PROT 6.1*  ALBUMIN 1.9*    CBC: Recent Labs  Lab 11/25/20 1412 11/25/20 1512 11/26/20 0550 11/27/20 0202 11/28/20 0233 11/29/20 0102 11/30/20 0156  WBC 19.0*  --  15.9* 16.3* 13.0* 15.7* 15.4*  NEUTROABS 14.3*  --   --  12.3*  --   --   --   HGB 10.3*   < > 8.1* 8.4* 7.9* 8.2* 7.1*  HCT 34.0*   < > 26.7* 27.7* 25.6* 26.6* 22.7*  MCV 106.9*  --  105.1* 106.9* 107.6* 106.4* 106.1*  PLT 374  --  247 228 185 197 148*   < > = values in this interval not displayed.    Blood Culture    Component Value Date/Time   SDES BLOOD RIGHT HAND 11/25/2020 2301   SPECREQUEST  11/25/2020 2301    BOTTLES DRAWN AEROBIC ONLY Blood Culture results may not be optimal due to an inadequate volume of blood received in culture bottles   CULT  11/25/2020 2301    NO  GROWTH 4 DAYS Performed at [facility], [place] [address], [place], [place] [address]    REPTSTATUS PENDING 11/25/2020 2301   Studies/Results: No results found.  Medications:  sodium thiosulfate infusion for calciphylaxis 25 g (11/29/20 1456)    (feeding supplement) PROSource Plus  30 mL Oral TID BM   amiodarone  200 mg Oral Daily   apixaban  5 mg Oral BID   vitamin C  500 mg Oral BID   aspirin EC  81 mg Oral Daily   atorvastatin  20 mg Oral Daily   Chlorhexidine Gluconate Cloth  6 each Topical Q0600   famotidine  20 mg Oral Daily   feeding supplement  237 mL Oral BID BM   folic acid  1 mg Oral Daily   insulin aspart  0-6 Units Subcutaneous TID WC   linagliptin  5 mg Oral Daily   multivitamin  1 tablet Oral QHS   sevelamer carbonate  800 mg Oral TID WC    Dialysis Orders: TTS at [facility] 3:45hr, 250/500 (new cannulation protocol AVF), EDW 59.5kg,  3K/2.5Ca, AVF + TDC, no heparin - Venofer 100mg  x 5 ordered - Mircera 175mcg IV q 2 weeks (last 6/14)   Assessment/Plan:  Extensive gangrenous wounds (all toes, R thigh, R hip, entire sacrum, L hip): On Vanc/Cefepime. 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   Calciphylaxis wounds: B hip/sacrum. Plan to treat empirically with sodium thiosulfate and discontinue all Ca/Vit D/iron products.  ESRD:  Continue HD per TTS schedule - next HD 6/23. 4K 2Ca, 3.5h, 1-2L UF, No hep, Use TDC while inpatient  Hypertension/volume: HyPOtensive on admit - home meds on hold. Stable BPs now.  UF as tolerated.  Anemia: Hgb down to 7; not surprising, transfuse as needd. Will start Aranesp next HD. 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  Metabolic bone disease: CorrCa ok, Phos 6.2. D/c Vit D3 and changed binder from Auryxia -> Renvela. Using 2Ca bath with dialysis.  Nutrition:  Alb very low, push protein  CAD  PAD  T2DM  A-fib on Eliquis: Heparin bridge for upcoming surgery.  GOC: Needs ongoing dialogue and will see how  wounds progress.  Rexene Agent, MD  11/30/2020, 9:34 AM  [facility]

## 2020-11-30 NOTE — Evaluation (Signed)
Physical Therapy Evaluation Patient Details Name: Kathryn Hancock MRN: [id number] DOB: 05/07/1951 Today's Date: 11/30/2020   History of Present Illness  pt is a 70 y/o female presenting 6/17 with worsening of gangrene-like changes on her bil toes, non-healing wounds right thigh and  non healing ulcers of bil hips.  Pt s/p righ TMA and excisional debridement of right thight 6/20.  PMHx:  CAD, DM, ESRD, HTN, PAF, AAA.  Clinical Impression  Pt admitted with/for bil LE ischemia with non-healing wounds and gangrenous toes, s/p R TMA.  Pt needing moderate assist to EOB, but unable to keep her feet dependent to attempt standing or OOBl..  Pt currently limited functionally due to the problems listed below.  (see problems list.)  Pt will benefit from PT to maximize function and safety to be able to get home safely with available assist.     Follow Up Recommendations Home health PT;Supervision/Assistance - 24 hour;Other (comment) (pt wishes to attempt home with 24 hour assist from her daughter at a w/c level if needed, but wants to walk short distance.)    Equipment Recommendations  None recommended by PT;Other (comment) (TBA)    Recommendations for Other Services       Precautions / Restrictions Precautions Precautions: Fall Restrictions RLE Weight Bearing: Partial weight bearing Other Position/Activity Restrictions: PWB through R heel.      Mobility  Bed Mobility Overal bed mobility: Needs Assistance Bed Mobility: Supine to Sit;Sit to Supine     Supine to sit: Mod assist;+2 for physical assistance Sit to supine: Mod assist;+2 for physical assistance   General bed mobility comments: pt initiated movement of legs off the bed.  Legs support and truncal assist forward and up until pt had both UE assisting.    Transfers Overall transfer level: Needs assistance               General transfer comment: pt unable to tolerate R LE dependent for more than a few seconds x 2 trials with  supine position in between.  Ambulation/Gait                Stairs            Wheelchair Mobility    Modified Rankin (Stroke Patients Only)       Balance Overall balance assessment: Needs assistance Sitting-balance support: Feet supported;Feet unsupported;No upper extremity supported;Single extremity supported Sitting balance-Leahy Scale: Fair                                       Pertinent Vitals/Pain Pain Assessment: Faces Faces Pain Scale: Hurts even more Pain Location: R foot/amp site Pain Descriptors / Indicators: Grimacing;Moaning;Throbbing Pain Intervention(s): Monitored during session;Limited activity within patient's tolerance;Premedicated before session;Repositioned    Home Living Family/patient expects to be discharged to:: Private residence Living Arrangements: Children Available Help at Discharge: Family;Available 24 hours/day Type of Home: House Home Access: Stairs to enter   [employer] of Steps: 1 (threshold) Home Layout: One level Home Equipment: Tub bench;Hand held shower head;Bedside commode;Walker - 4 wheels;Wheelchair - manual Additional Comments: Daughter provides 24/7 care to pt    Prior Function Level of Independence: Independent with assistive device(s) (less independent in the last month)               Hand Dominance   Dominant Hand: Right    Extremity/Trunk Assessment   Upper Extremity Assessment Upper Extremity Assessment: Generalized  weakness    Lower Extremity Assessment Lower Extremity Assessment: Generalized weakness       Communication   Communication: No difficulties  Cognition Arousal/Alertness: Awake/alert Behavior During Therapy: WFL for tasks assessed/performed;Flat affect Overall Cognitive Status: Within Functional Limits for tasks assessed                                        General Comments General comments (skin integrity, edema, etc.): vss  overall    Exercises Other Exercises Other Exercises: AAROM to BIL LE's hip/knee/ab/add/flex/ext   Assessment/Plan    PT Assessment Patient needs continued PT services  PT Problem List Decreased strength;Decreased activity tolerance;Decreased mobility;Decreased knowledge of use of DME;Decreased knowledge of precautions;Pain       PT Treatment Interventions DME instruction;Gait training;Functional mobility training;Therapeutic activities;Therapeutic exercise;Patient/family education;Wheelchair mobility training    PT Goals (Current goals can be found in the Care Plan section)  Acute Rehab PT Goals Patient Stated Goal: want to get home.   walk if I can, w/c if I need to. PT Goal Formulation: With patient Time For Goal Achievement: 12/14/20 Potential to Achieve Goals: Fair    Frequency Min 3X/week   Barriers to discharge        Co-evaluation               AM-PAC PT "6 Clicks" Mobility  Outcome Measure Help needed turning from your back to your side while in a flat bed without using bedrails?: Total Help needed moving from lying on your back to sitting on the side of a flat bed without using bedrails?: Total Help needed moving to and from a bed to a chair (including a wheelchair)?: Total Help needed standing up from a chair using your arms (e.g., wheelchair or bedside chair)?: Total Help needed to walk in hospital room?: Total Help needed climbing 3-5 steps with a railing? : Total 6 Click Score: 6    End of Session   Activity Tolerance: Patient limited by pain Patient left: in bed;with call bell/phone within reach;with bed alarm set;with family/visitor present Nurse Communication: Mobility status PT Visit Diagnosis: Other abnormalities of gait and mobility (R26.89);Muscle weakness (generalized) (M62.81);Difficulty in walking, not elsewhere classified (R26.2);Pain Pain - Right/Left:  (bil) Pain - part of body: Hip;Leg;Ankle and joints of foot (multtiple painful wounds  and acute pain R thigh and amputation site.)    Time: 0923-3007 PT Time Calculation (min) (ACUTE ONLY): 49 min   Charges:   PT Evaluation $PT Eval Moderate Complexity: 1 Mod PT Treatments $Gait Training: 8-22 mins $Therapeutic Activity: 23-37 mins        11/30/2020  Ginger Carne., PT Acute Rehabilitation Services [phone]  (pager) [phone]  (office)  Kathryn Hancock 11/30/2020, 4:05 PM

## 2020-12-01 LAB — PREPARE RBC (CROSSMATCH)

## 2020-12-01 LAB — CBC
HCT: 21.6 % — ABNORMAL LOW (ref 36.0–46.0)
Hemoglobin: 6.8 g/dL — CL (ref 12.0–15.0)
MCH: 33.2 pg (ref 26.0–34.0)
MCHC: 31.5 g/dL (ref 30.0–36.0)
MCV: 105.4 fL — ABNORMAL HIGH (ref 80.0–100.0)
Platelets: 140 K/uL — ABNORMAL LOW (ref 150–400)
RBC: 2.05 MIL/uL — ABNORMAL LOW (ref 3.87–5.11)
RDW: 22 % — ABNORMAL HIGH (ref 11.5–15.5)
WBC: 13 K/uL — ABNORMAL HIGH (ref 4.0–10.5)
nRBC: 0 % (ref 0.0–0.2)

## 2020-12-01 LAB — BASIC METABOLIC PANEL
Anion gap: 7 (ref 5–15)
BUN: 32 mg/dL — ABNORMAL HIGH (ref 8–23)
CO2: 26 mmol/L (ref 22–32)
Calcium: 7.7 mg/dL — ABNORMAL LOW (ref 8.9–10.3)
Chloride: 102 mmol/L (ref 98–111)
Creatinine, Ser: 4.15 mg/dL — ABNORMAL HIGH (ref 0.44–1.00)
GFR, Estimated: 11 mL/min — ABNORMAL LOW (ref >60)
Glucose, Bld: 166 mg/dL — ABNORMAL HIGH (ref 70–99)
Potassium: 4.1 mmol/L (ref 3.5–5.1)
Sodium: 135 mmol/L (ref 135–145)

## 2020-12-01 LAB — GLUCOSE, CAPILLARY
Glucose-Capillary: 124 mg/dL — ABNORMAL HIGH (ref 70–99)
Glucose-Capillary: 138 mg/dL — ABNORMAL HIGH (ref 70–99)
Glucose-Capillary: 117 mg/dL — ABNORMAL HIGH (ref 70–99)

## 2020-12-01 LAB — CULTURE, BLOOD (ROUTINE X 2): Culture: NO GROWTH

## 2020-12-01 MED ORDER — SODIUM CHLORIDE 0.9% IV SOLUTION
Freq: Once | INTRAVENOUS | Status: AC
Start: 2020-12-01 — End: 2020-12-01

## 2020-12-01 MED ORDER — DARBEPOETIN ALFA 200 MCG/0.4ML IJ SOSY
200.0000 ug | PREFILLED_SYRINGE | INTRAMUSCULAR | Status: DC
  Filled 2020-12-01: qty 0.4

## 2020-12-01 NOTE — Progress Notes (Signed)
PROGRESS NOTE    Kathryn Hancock  OAC:[id number] DOB: 1950/12/04 DOA: 11/25/2020 PCP: Gala Lewandowsky, MD     Brief Narrative:  Kathryn Hancock is a 70 y.o. female with medical history significant of PAD with status post left and right leg bypasses, chronic bilateral toes ischemia, unhealing wound of right thigh after right femoropopliteal bypass, ESRD on HD TTS, PAF on Eliquis, IDDM, HTN, CAD status post CABG, HTN, HLD, presented with worsening of gangrene-like changes on bilateral toes and on healing wound of right thigh, and unhealing ulcers of bilateral hips.  Vascular surgery consulted.  Patient underwent right TMA, excisional debridement of the right thigh with Dr. Scot Dock 6/20.  New events last 24 hours / Subjective: Doing well overall, voices no new complaints.  Hemodialysis scheduled for later today.  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   HLD (hyperlipidemia)   Pressure injury of skin   Calciphylaxis   Nonhealing ulcer of the right medial thigh at the site of saphenectomy incision -Sepsis ruled out, afebrile, heart rate and respiratory rate within normal range on admission although she did have leukocytosis as well as lactic acidosis -Blood cultures negative  -Vascular surgery following, status post excisional debridement 6/20  Calciphylaxis -Appreciate nephrology, sodium thiosulfate with dialysis and local wound care  PAD with bilateral gangrenous all 10 toes -Vascular surgery following -Continue  aspirin, Lipitor -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 Subcutaneous fat may be visible but bone, tendon or muscle are NOT exposed. 2 cm x 1 cm shallow bed with yellow/white (Active)     Location: Coccyx  Location Orientation: Medial  Staging: Stage 3 -  Full thickness tissue loss. Subcutaneous fat may be visible but bone, tendon or muscle are NOT exposed.  Wound Description (Comments): 2 cm x 1 cm shallow bed with yellow/white  Present on Admission: Yes     Pressure Injury 10/31/20 Buttocks Right;Upper Unstageable - Full thickness tissue loss in which the base of the injury is covered by slough (yellow, tan, gray, green or brown) and/or eschar (tan, brown or black) in the wound bed. 2 cm x 1.5 cm eschar (Active)  10/31/20 1500  Location: Buttocks  Location Orientation: Right;Upper  Staging: Unstageable - Full thickness tissue loss in which the base of the injury is covered by slough (yellow, tan, gray, green or brown) and/or eschar (tan, brown or black) in the wound bed.  Wound Description (Comments): 2 cm x 1.5 cm eschar  Present on Admission: Yes     Pressure Injury 10/31/20 Buttocks Right;Lower Unstageable -  Full thickness tissue loss in which the base of the injury is covered by slough (yellow, tan, gray, green or brown) and/or eschar (tan, brown or black) in the wound bed. 1 cm x 1 cm eschar (Active)  10/31/20 1500  Location: Buttocks  Location Orientation: Right;Lower  Staging: Unstageable  - Full thickness tissue loss in which the base of the injury is covered by slough (yellow, tan, gray, green or brown) and/or eschar (tan, brown or black) in the wound bed.  Wound Description (Comments): 1 cm x 1 cm eschar  Present on Admission: Yes     Pressure Injury 10/31/20 Heel Posterior;Right Unstageable - Full thickness tissue loss in which the base of the injury is covered by slough (yellow, tan, gray, green or brown) and/or eschar (tan, brown or black) in the wound bed. eschar (Active)  10/31/20 1500  Location: Heel  Location Orientation: Posterior;Right  Staging: Unstageable - Full thickness tissue loss in which the base of the injury is covered by slough (yellow, tan, gray, green or brown) and/or eschar (tan, brown or black) in the wound bed.  Wound Description (Comments): eschar  Present on Admission: Yes     Pressure Injury 11/25/20 Thigh Right;Medial Stage 3 -  Full thickness tissue loss. Subcutaneous fat may be visible but bone, tendon or muscle are NOT exposed. (Active)  11/25/20 1945  Location: Thigh  Location Orientation: Right;Medial  Staging: Stage 3 -  Full thickness tissue loss. Subcutaneous fat may be visible but bone, tendon or muscle are NOT exposed.  Wound Description (Comments):   Present on Admission: Yes     Pressure Injury Flank Left Unstageable - Full thickness tissue loss in which the base of the injury is covered by slough (yellow, tan, gray, green or brown) and/or eschar (tan, brown or black) in the wound bed. (Active)     Location: Flank  Location Orientation: Left  Staging: Unstageable - Full thickness tissue loss in which the base of the injury is covered by slough (yellow, tan, gray, green or brown) and/or eschar (tan, brown or black) in the wound bed.  Wound Description (Comments):   Present on Admission: Yes     Pressure Injury 11/28/20 Buttocks Left Unstageable - Full thickness tissue loss in which the base of the injury is covered by slough  (yellow, tan, gray, green or brown) and/or eschar (tan, brown or black) in the wound bed. (Active)  11/28/20 0826  Location: Buttocks  Location Orientation: Left  Staging: Unstageable - Full thickness tissue loss in which the base of the injury is covered by slough (yellow, tan, gray, green or brown) and/or eschar (tan, brown or black) in the wound bed.  Wound Description (Comments):   Present on Admission:      Pressure Injury 11/28/20 Buttocks Right Unstageable - Full thickness tissue loss in which the base of the injury is covered by slough (yellow, tan, gray, green or brown) and/or eschar (tan, brown or black) in the wound bed. (Active)  11/28/20 0826  Location: Buttocks  Location Orientation: Right  Staging: Unstageable - Full thickness tissue loss in which the base of the injury is covered by slough (yellow, tan, gray, green or brown) and/or eschar (tan, brown or black) in the wound bed.  Wound Description (Comments):   Present on Admission: Yes     Pressure Injury 11/28/20 Hip Left Unstageable - Full thickness tissue loss in which the base of the injury is covered by slough (yellow, tan, gray, green or brown) and/or eschar (  tan, brown or black) in the wound bed. (Active)  11/28/20 0827  Location: Hip  Location Orientation: Left  Staging: Unstageable - Full thickness tissue loss in which the base of the injury is covered by slough (yellow, tan, gray, green or brown) and/or eschar (tan, brown or black) in the wound bed.  Wound Description (Comments):   Present on Admission: Yes     Nutrition Problem: Moderate Malnutrition Etiology: chronic illness (PAD, ESRD on HD)   DVT prophylaxis: apixaban (ELIQUIS) tablet 5 mg  Code Status:     Code Status Orders  (From admission, onward)           Start     Ordered   11/25/20 1633  Full code  Continuous        11/25/20 1633           Code Status History     Date Active Date Inactive Code Status Order ID Comments User  Context   10/31/2020 1232 11/02/2020 2305 Full Code [id number]  Ortencia Kick Inpatient   09/29/2020 1730 10/03/2020 2303 Full Code [id number]  Eulis Foster, MD ED   09/12/2020 1152 09/14/2020 1801 Full Code [id number]  Angelia Mould, MD Inpatient   07/01/2020 0210 07/15/2020 1754 Full Code [id number]  Rise Patience, MD ED      Family Communication: No family at bedside Disposition Plan:  Status is: Inpatient  Remains inpatient appropriate because:Unsafe d/c plan  Dispo: The patient is from: Home              Anticipated d/c is to: SNF              Patient currently is medically stable to d/c.     Difficult to place patient No      Consultants:  Vascular surgery Nephrology  Procedures:  Status postdebridement of the right thigh, right TMA 6/20  Antimicrobials:  Anti-infectives (From admission, onward)    Start     Dose/Rate Route Frequency Ordered Stop   11/29/20 1800  ceFEPIme (MAXIPIME) 2 g in sodium chloride 0.9 % 100 mL IVPB        2 g 200 mL/hr over 30 Minutes Intravenous  Once 11/29/20 0908 11/29/20 2046   11/26/20 1700  ceFEPIme (MAXIPIME) 1 g in sodium chloride 0.9 % 100 mL IVPB  Status:  Discontinued        1 g 200 mL/hr over 30 Minutes Intravenous Every 24 hours 11/25/20 1713 11/29/20 0908   11/26/20 1200  vancomycin (VANCOREADY) IVPB 750 mg/150 mL  Status:  Discontinued        750 mg 150 mL/hr over 60 Minutes Intravenous Every T-Th-Sa (Hemodialysis) 11/25/20 1714 11/29/20 0909   11/26/20 1126  vancomycin (VANCOREADY) 750 MG/150ML IVPB       Note to Pharmacy: Cherylann Banas   : cabinet override      11/26/20 1126 11/26/20 1200   11/26/20 1125  vancomycin (VANCOREADY) 750 MG/150ML IVPB       Note to Pharmacy: Cherylann Banas   : cabinet override      11/26/20 1125 11/26/20 1159   11/25/20 1700  vancomycin (VANCOREADY) IVPB 1250 mg/250 mL        1,250 mg 166.7 mL/hr over 90 Minutes Intravenous  Once 11/25/20 1645 11/26/20 0053   11/25/20  1645  ceFEPIme (MAXIPIME) 2 g in sodium chloride 0.9 % 100 mL IVPB        2 g 200 mL/hr over 30 Minutes Intravenous  Once 11/25/20  1644 11/25/20 2255   11/25/20 1500  clindamycin (CLEOCIN) IVPB 600 mg        600 mg 100 mL/hr over 30 Minutes Intravenous  Once 11/25/20 1459 11/25/20 1606        Objective: Vitals:   12/01/20 1100 12/01/20 1130 12/01/20 1145 12/01/20 1200  BP: 128/61 (!) 121/58 (!) 132/58 128/77  Pulse: 69 71 70 74  Resp:      Temp:      TempSrc:      SpO2:      Weight:      Height:        Intake/Output Summary (Last 24 hours) at 12/01/2020 1236 Last data filed at 12/01/2020 0615 Gross per 24 hour  Intake 949.67 ml  Output --  Net 949.67 ml    Filed Weights   11/30/20 0449 12/01/20 0317 12/01/20 0942  Weight: 72 kg 73.8 kg 73.8 kg   Examination: General exam: Appears calm and comfortable  Respiratory system: Clear to auscultation. Respiratory effort normal. Cardiovascular system: S1 & S2 heard, RRR. No pedal edema. Gastrointestinal system: Abdomen is nondistended, soft and nontender. Normal bowel sounds heard. Central nervous system: Alert and oriented. Non focal exam. Speech clear  Skin: Wounds not evaluated today, right TMA and right thigh is wrapped in dry and clean dressing Psychiatry: Judgement and insight appear stable. Mood & affect appropriate.     Data Reviewed: I have personally reviewed following labs and imaging studies  CBC: Recent Labs  Lab 11/25/20 1412 11/25/20 1512 11/27/20 0202 11/28/20 0233 11/29/20 0102 11/30/20 0156 12/01/20 0115  WBC 19.0*   < > 16.3* 13.0* 15.7* 15.4* 13.0*  NEUTROABS 14.3*  --  12.3*  --   --   --   --   HGB 10.3*   < > 8.4* 7.9* 8.2* 7.1* 6.8*  HCT 34.0*   < > 27.7* 25.6* 26.6* 22.7* 21.6*  MCV 106.9*   < > 106.9* 107.6* 106.4* 106.1* 105.4*  PLT 374   < > 228 185 197 148* 140*   < > = values in this interval not displayed.    Basic Metabolic Panel: Recent Labs  Lab 11/27/20 0202 11/28/20 0233  11/29/20 0102 11/30/20 0156 12/01/20 0115  NA 137 137 137 136 135  K 3.7 3.0* 3.3* 3.3* 4.1  CL 97* 101 103 100 102  CO2 26 24 23 26 26   GLUCOSE 111* 144* 147* 120* 166*  BUN 11 24* 35* 17 32*  CREATININE 2.68* 3.87* 4.66* 2.73* 4.15*  CALCIUM 7.6* 7.8* 8.2* 7.6* 7.7*    GFR: Estimated Creatinine Clearance: 12.7 mL/min (A) (by C-G formula based on SCr of 4.15 mg/dL (H)). Liver Function Tests: Recent Labs  Lab 11/25/20 1412  AST 24  ALT 14  ALKPHOS 209*  BILITOT 0.6  PROT 6.1*  ALBUMIN 1.9*    No results for input(s): LIPASE, AMYLASE in the last 168 hours. No results for input(s): AMMONIA in the last 168 hours. Coagulation Profile: Recent Labs  Lab 11/25/20 1412 11/28/20 0233  INR 2.3* 1.3*    Cardiac Enzymes: No results for input(s): CKTOTAL, CKMB, CKMBINDEX, TROPONINI in the last 168 hours. BNP (last 3 results) No results for input(s): PROBNP in the last 8760 hours. HbA1C: No results for input(s): HGBA1C in the last 72 hours. CBG: Recent Labs  Lab 11/30/20 0619 11/30/20 1116 11/30/20 1608 11/30/20 2112 12/01/20 0621  GLUCAP 134* 111* 144* 202* 124*    Lipid Profile: No results for input(s): CHOL, HDL, LDLCALC, TRIG,  CHOLHDL, LDLDIRECT in the last 72 hours. Thyroid Function Tests: No results for input(s): TSH, T4TOTAL, FREET4, T3FREE, THYROIDAB in the last 72 hours. Anemia Panel: No results for input(s): VITAMINB12, FOLATE, FERRITIN, TIBC, IRON, RETICCTPCT in the last 72 hours. Sepsis Labs: Recent Labs  Lab 11/25/20 1406 11/25/20 2302 11/26/20 0550 11/27/20 0202  LATICACIDVEN 4.6* 3.8* 2.1* 2.4*     Recent Results (from the past 240 hour(s))  Blood Culture (routine x 2)     Status: None   Collection Time: 11/25/20  2:06 PM   Specimen: BLOOD  Result Value Ref Range Status   Specimen Description BLOOD RIGHT ANTECUBITAL  Final   Special Requests   Final    BOTTLES DRAWN AEROBIC AND ANAEROBIC Blood Culture adequate volume   Culture   Final     NO GROWTH 5 DAYS Performed at [facility], [address] [place], [place] [address]    Report Status 11/30/2020 FINAL  Final  Resp Panel by RT-PCR (Flu A&B, Covid) Nasopharyngeal Swab     Status: None   Collection Time: 11/25/20  2:06 PM   Specimen: Nasopharyngeal Swab; Nasopharyngeal(NP) swabs in vial transport medium  Result Value Ref Range Status   SARS Coronavirus 2 by RT PCR NEGATIVE NEGATIVE Final    Comment: (NOTE) SARS-CoV-2 target nucleic acids are NOT DETECTED.  The SARS-CoV-2 RNA is generally detectable in upper respiratory specimens during the acute phase of infection. The lowest concentration of SARS-CoV-2 viral copies this assay can detect is 138 copies/mL. A negative result does not preclude SARS-Cov-2 infection and should not be used as the sole basis for treatment or other patient management decisions. A negative result may occur with  improper specimen collection/handling, submission of specimen other than nasopharyngeal swab, presence of viral mutation(s) within the areas targeted by this assay, and inadequate number of viral copies(<138 copies/mL). A negative result must be combined with clinical observations, patient history, and epidemiological information. The expected result is Negative.  Fact Sheet for Patients:  EntrepreneurPulse.com.au  Fact Sheet for Healthcare Providers:  IncredibleEmployment.be  This test is no t yet approved or cleared by the [place] FDA and  has been authorized for detection and/or diagnosis of SARS-CoV-2 by FDA under an Emergency Use Authorization (EUA). This EUA will remain  in effect (meaning this test can be used) for the duration of the COVID-19 declaration under Section 564(b)(1) of the Act, 21 U.S.C.section 360bbb-3(b)(1), unless the authorization is terminated  or revoked sooner.       Influenza A by PCR NEGATIVE NEGATIVE Final   Influenza B by PCR NEGATIVE NEGATIVE  Final    Comment: (NOTE) The Xpert Xpress SARS-CoV-2/FLU/RSV plus assay is intended as an aid in the diagnosis of influenza from Nasopharyngeal swab specimens and should not be used as a sole basis for treatment. Nasal washings and aspirates are unacceptable for Xpert Xpress SARS-CoV-2/FLU/RSV testing.  Fact Sheet for Patients: EntrepreneurPulse.com.au  Fact Sheet for Healthcare Providers: IncredibleEmployment.be  This test is not yet approved or cleared by the [place] FDA and has been authorized for detection and/or diagnosis of SARS-CoV-2 by FDA under an Emergency Use Authorization (EUA). This EUA will remain in effect (meaning this test can be used) for the duration of the COVID-19 declaration under Section 564(b)(1) of the Act, 21 U.S.C. section 360bbb-3(b)(1), unless the authorization is terminated or revoked.  Performed at [facility], [place] [address] [place], [place] [address]   Blood Culture (routine x 2)  Status: None   Collection Time: 11/25/20 11:01 PM   Specimen: BLOOD RIGHT HAND  Result Value Ref Range Status   Specimen Description BLOOD RIGHT HAND  Final   Special Requests   Final    BOTTLES DRAWN AEROBIC ONLY Blood Culture results may not be optimal due to an inadequate volume of blood received in culture bottles   Culture   Final    NO GROWTH 5 DAYS Performed at [facility], [place] [address], [place], [place] [address]    Report Status 12/01/2020 FINAL  Final       Radiology Studies: No results found.    Scheduled Meds:  (feeding supplement) PROSource Plus  30 mL Oral TID BM   amiodarone  200 mg Oral Daily   apixaban  5 mg Oral BID   vitamin C  500 mg Oral BID   aspirin EC  81 mg Oral Daily   atorvastatin  20 mg Oral Daily   Chlorhexidine Gluconate Cloth  6 each Topical Q0600   darbepoetin (ARANESP) injection - DIALYSIS  200 mcg Intravenous Q Thu-HD   famotidine  20 mg Oral Daily   feeding  supplement  237 mL Oral BID BM   folic acid  1 mg Oral Daily   insulin aspart  0-6 Units Subcutaneous TID WC   linagliptin  5 mg Oral Daily   multivitamin  1 tablet Oral QHS   sevelamer carbonate  800 mg Oral TID WC   Continuous Infusions:  sodium thiosulfate infusion for calciphylaxis 25 g (11/29/20 1456)     LOS: 6 days      Time spent: 20 minutes   Dessa Phi, DO [facility] 12/01/2020, 12:36 PM   Available via Epic secure chat 7am-7pm After these hours, please refer to coverage provider listed on [email or web address]

## 2020-12-01 NOTE — Procedures (Signed)
Patient was seen on dialysis and the procedure was supervised.  BFR 400  Via TDC-  BP is  129/61.   Patient appears to be tolerating treatment well  Kathryn Hancock 12/01/2020

## 2020-12-01 NOTE — Progress Notes (Signed)
TRH night shift.  The nursing staff reported that the patient's hemoglobin this morning was 6.8 g/dL.  A PRBC unit transfusion was ordered.  Tennis Must, MD.

## 2020-12-01 NOTE — TOC Progression Note (Signed)
Transition of Care [facility]) - Progression Note    Patient Details  Name: Kathryn Hancock MRN: [id number] Date of Birth: 09-28-1950  Transition of Care [facility] CM/SW [place], [place] Phone Number: 12/01/2020, 11:52 AM  Clinical Narrative:     CSW spoke with [facility]- she will call back &  she confirm bed offer, dialysis schedule and availability. CSW waiting on response.  Thurmond Butts, MSW, LCSW Clinical Social Worker    Expected Discharge Plan: Skilled Nursing Facility Barriers to Discharge: Continued Medical Work up, SNF Pending bed offer  Expected Discharge Plan and Services Expected Discharge Plan: [place] In-house Referral: Clinical Social Work                                             Social Determinants of Health (SDOH) Interventions    Readmission Risk Interventions Readmission Risk Prevention Plan 11/02/2020  Transportation Screening Complete  Medication Review Press photographer) Complete  HRI or [place] Complete  SW Recovery Care/Counseling Consult Complete  Palliative Care Screening Not [place] Not Applicable

## 2020-12-01 NOTE — Progress Notes (Signed)
PT Cancellation Note  Patient Details Name: Kathryn Hancock MRN: [id number] DOB: May 13, 1951   Cancelled Treatment:    Reason Eval/Treat Not Completed: (P) Patient at procedure or test/unavailable pt at HD dept. Will continue efforts per PT POC as schedule permits.   Kara Pacer Sicilia Killough 12/01/2020, 12:15 PM

## 2020-12-01 NOTE — Progress Notes (Addendum)
Renal Navigator received update from [facility] CSW/C.Wynetta Emery that patient has bed offer from [facility] and agrees to go to short term rehab there if her outpatient HD clinic can provide her with a MWF seat schedule. Navigator has reached out to [facility] to request a seat change for SNF accommodation and will await response.  Navigator met with patient at HD bedside to see how she is doing. Patient states she is sore and wonders how much time she has left. She has only minutes and Navigator tells her this. She is relieved. Navigator spoke with her about the need to be able to sit in a chair for the length of time her HD treatment is before she will be able to discharge. She states a goal for today with therapy is to be able to get out of bed and in to her chair, but she is certain she will not be able to sit for 4 hours on her first try. Navigator agreed and encouraged her to do what she can.   Alphonzo Cruise, [place] Renal Navigator [phone]

## 2020-12-01 NOTE — Progress Notes (Signed)
[facility] Progress Note   Subjective:   Seen in room, no c/o-  hgb low-  got one unit PRBC this AM Will be for SNF at DC Due for HD today   Objective Vitals:   12/01/20 0347 12/01/20 0615 12/01/20 0630 12/01/20 0732  BP: (!) 119/50 (!) 119/54 (!) 119/54 (!) 120/52  Pulse: 67 66 66 70  Resp: 16 16 16 17   Temp: 98.7 F (37.1 C) 98.4 F (36.9 C) 98.4 F (36.9 C) 98.1 F (36.7 C)  TempSrc: Oral Oral Oral Oral  SpO2: 95%  95% 94%  Weight:      Height:       Physical Exam General: Chronically ill appearing woman, NAD Heart: RRR; no murmur Lungs: CTA anteiorly Abdomen: soft, non-tender Extremities: 1+ pitting LE edema to knees. R TMA site bandaged. All L toes with dry gangrene. R thigh bandaged. Hip and sacrum not examined Dialysis Access: TDC in R chest, LUE AVF + bruit and sig hematoma  Additional Objective Labs: Basic Metabolic Panel: Recent Labs  Lab 11/29/20 0102 11/30/20 0156 12/01/20 0115  NA 137 136 135  K 3.3* 3.3* 4.1  CL 103 100 102  CO2 23 26 26   GLUCOSE 147* 120* 166*  BUN 35* 17 32*  CREATININE 4.66* 2.73* 4.15*  CALCIUM 8.2* 7.6* 7.7*   Liver Function Tests: Recent Labs  Lab 11/25/20 1412  AST 24  ALT 14  ALKPHOS 209*  BILITOT 0.6  PROT 6.1*  ALBUMIN 1.9*   CBC: Recent Labs  Lab 11/25/20 1412 11/25/20 1512 11/27/20 0202 11/28/20 0233 11/29/20 0102 11/30/20 0156 12/01/20 0115  WBC 19.0*   < > 16.3* 13.0* 15.7* 15.4* 13.0*  NEUTROABS 14.3*  --  12.3*  --   --   --   --   HGB 10.3*   < > 8.4* 7.9* 8.2* 7.1* 6.8*  HCT 34.0*   < > 27.7* 25.6* 26.6* 22.7* 21.6*  MCV 106.9*   < > 106.9* 107.6* 106.4* 106.1* 105.4*  PLT 374   < > 228 185 197 148* 140*   < > = values in this interval not displayed.   Blood Culture    Component Value Date/Time   SDES BLOOD RIGHT HAND 11/25/2020 2301   SPECREQUEST  11/25/2020 2301    BOTTLES DRAWN AEROBIC ONLY Blood Culture results may not be optimal due to an inadequate volume of  blood received in culture bottles   CULT  11/25/2020 2301    NO GROWTH 4 DAYS Performed at [facility], [place] [address] [place], [place] [address]    REPTSTATUS PENDING 11/25/2020 2301   Studies/Results: No results found.  Medications:  sodium thiosulfate infusion for calciphylaxis 25 g (11/29/20 1456)    (feeding supplement) PROSource Plus  30 mL Oral TID BM   amiodarone  200 mg Oral Daily   apixaban  5 mg Oral BID   vitamin C  500 mg Oral BID   aspirin EC  81 mg Oral Daily   atorvastatin  20 mg Oral Daily   Chlorhexidine Gluconate Cloth  6 each Topical Q0600   famotidine  20 mg Oral Daily   feeding supplement  237 mL Oral BID BM   folic acid  1 mg Oral Daily   insulin aspart  0-6 Units Subcutaneous TID WC   linagliptin  5 mg Oral Daily   multivitamin  1 tablet Oral QHS   sevelamer carbonate  800 mg Oral TID WC  Dialysis Orders: TTS at [facility] 3:45hr, 250/500 (new cannulation protocol AVF), EDW 59.5kg, 3K/2.5Ca, AVF + TDC, no heparin - Venofer 100mg  x 5 ordered - Mircera 15mcg IV q 2 weeks (last 6/14)   Assessment/Plan:  Extensive gangrenous wounds (all toes, R thigh, R hip, entire sacrum, L hip): On Vanc/Cefepime. 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   Calciphylaxis wounds: B hip/sacrum. Plan to treat empirically with sodium thiosulfate and discontinue all Ca/Vit D/iron products.  ESRD:  Continue HD per TTS schedule - next HD 6/23- today. 4K 2Ca, 3.5h, 1-2L UF, No hep, Use TDC while inpatient  Hypertension/volume: Hypotensive on admit - home meds on hold. Stable BPs now.  UF as tolerated.  Anemia: Hgb down to 7; not surprising, transfuse as needed- got one unit today. Will start Aranesp. 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  Metabolic bone disease: calciphylaxis-  CorrCa ok, Phos 6.2. D/c Vit D3 and changed binder from Auryxia -> Renvela. Using 2Ca bath with dialysis as well as sodium thiosulfate  Nutrition:  Alb very low, push  protein  CAD  PAD  T2DM  A-fib on Eliquis: Heparin bridge for surgery.  GOC: Needs ongoing dialogue and will see how wounds progress.  For SNF placement   Louis Meckel, MD  12/01/2020, 9:08 AM  [facility]

## 2020-12-01 NOTE — Progress Notes (Addendum)
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  Her dressing was changed at 0400 and I did not disturb. She has now undergone a right transmetatarsal amputation and physical therapy is working with her to teach her how to use her Darco shoe so that she can be partial weightbearing on the right side heel only. Dr. Scot Dock feels if she had bilateral TMA she would be wheelchair-bound.   I&D RIGHT SAPHENECTOMY SITE: VSS. Afebrile. BC negative times 3 days. WBCs 15.4. Dressing was changed 0400. I did not disturb.  She has an extensive wound here which will require meticulous wound care but should heal without any problem.  The bypass graft is vein and is deep to the muscle so there is no concern here that the graft is compromised. Continue W>D dressing changes BID.  Anemia: multifactorial. Received one unit PCs early this morning. Post transfusion H and H pending.   ESRD: s/p LUE BVT. Currently dialyzing via [place]. LUE edematous and bruised>recommend resting AVF. Palpable thrill.   History of pararenal abdominal aortic aneurysm followed by Dr. Scot Dock as an outpatient.  Per his assessment, should this enlarge she would require a fenestrated graft versus a thoracoabdominal repair and would have to be referred to [facility].   History of A. fib: back on Eliquis   NUTRITION: The patient has moderate malnutrition.  The nutrition team is following.  She is on p.o. supplements and if she continues to try to eat should meet 90% of her requirements.   SUBJECTIVE:   No complaints  PHYSICAL EXAM:   Vitals:   12/01/20 0732 12/01/20 0942 12/01/20 0948 12/01/20 1000  BP: (!) 120/52 (!) 163/61 (!) 122/47 129/61  Pulse: 70 67 69 66  Resp: 17 16    Temp: 98.1 F (36.7 C) 99.1 F (37.3 C)    TempSrc: Oral Oral    SpO2: 94% 97%    Weight:      Height:       General: Chronically ill appearing, not in acute  distress Cardiac:  regular  Lungs:  non labored Incisions: Right thigh saphenectomy incision and right TMA dressings just changed by nursing at 0400 and were left intact.  Extremities:  well perfused and warm. Dry stable gangrene of left toes. LUE AVF with good thrill. Moderate edema and ecchymosis Neurologic: alert and oriented  LABS:   Lab Results  Component Value Date   WBC 13.0 (H) 12/01/2020   HGB 6.8 (LL) 12/01/2020   HCT 21.6 (L) 12/01/2020   MCV 105.4 (H) 12/01/2020   PLT 140 (L) 12/01/2020   Lab Results  Component Value Date   CREATININE 4.15 (H) 12/01/2020   Lab Results  Component Value Date   INR 1.3 (H) 11/28/2020   CBG (last 3)  Recent Labs    11/30/20 1608 11/30/20 2112 12/01/20 0621  GLUCAP 144* 202* 124*    PROBLEM LIST:    Active Problems:   ESRD (end stage renal disease) on dialysis (HCC)   Pressure injury of skin   Limb ischemia   CURRENT MEDS:    (feeding supplement) PROSource Plus  30 mL Oral TID BM   amiodarone  200 mg Oral Daily   apixaban  5 mg Oral BID   vitamin C  500 mg Oral BID   aspirin EC  81 mg Oral Daily   atorvastatin  20 mg Oral Daily   Chlorhexidine Gluconate Cloth  6 each Topical Q0600   darbepoetin (ARANESP) injection - DIALYSIS  200 mcg Intravenous Q Thu-HD   famotidine  20 mg Oral Daily   feeding supplement  237 mL Oral BID BM   folic acid  1 mg Oral Daily   insulin aspart  0-6 Units Subcutaneous TID WC   linagliptin  5 mg Oral Daily   multivitamin  1 tablet Oral QHS   sevelamer carbonate  800 mg Oral TID WC    Barbie Banner, PA-C  Office: [phone] 12/01/2020   I have interviewed the patient and examined the patient. I agree with the findings by the PA.   Gae Gallop, MD

## 2020-12-01 NOTE — Progress Notes (Signed)
Critical lab result hgb 6.8, reported to on-call physician. Will continue to monitor.

## 2020-12-02 DIAGNOSIS — L8993 Pressure ulcer of unspecified site, stage 3: Secondary | ICD-10-CM

## 2020-12-02 DIAGNOSIS — I2510 Atherosclerotic heart disease of native coronary artery without angina pectoris: Secondary | ICD-10-CM

## 2020-12-02 DIAGNOSIS — D631 Anemia in chronic kidney disease: Secondary | ICD-10-CM

## 2020-12-02 DIAGNOSIS — I480 Paroxysmal atrial fibrillation: Secondary | ICD-10-CM

## 2020-12-02 DIAGNOSIS — D696 Thrombocytopenia, unspecified: Secondary | ICD-10-CM

## 2020-12-02 DIAGNOSIS — I10 Essential (primary) hypertension: Secondary | ICD-10-CM

## 2020-12-02 DIAGNOSIS — E1129 Type 2 diabetes mellitus with other diabetic kidney complication: Secondary | ICD-10-CM

## 2020-12-02 DIAGNOSIS — E785 Hyperlipidemia, unspecified: Secondary | ICD-10-CM

## 2020-12-02 DIAGNOSIS — N189 Chronic kidney disease, unspecified: Secondary | ICD-10-CM

## 2020-12-02 DIAGNOSIS — D62 Acute posthemorrhagic anemia: Secondary | ICD-10-CM

## 2020-12-02 DIAGNOSIS — I96 Gangrene, not elsewhere classified: Secondary | ICD-10-CM

## 2020-12-02 DIAGNOSIS — R5381 Other malaise: Secondary | ICD-10-CM

## 2020-12-02 LAB — TYPE AND SCREEN
ABO/RH(D): O POS
Antibody Screen: NEGATIVE
Unit division: 0

## 2020-12-02 LAB — BPAM RBC
Blood Product Expiration Date: 202207272359
ISSUE DATE / TIME: 202206230317
Unit Type and Rh: 5100

## 2020-12-02 LAB — BASIC METABOLIC PANEL
Anion gap: 9 (ref 5–15)
BUN: 20 mg/dL (ref 8–23)
CO2: 27 mmol/L (ref 22–32)
Calcium: 7.9 mg/dL — ABNORMAL LOW (ref 8.9–10.3)
Chloride: 99 mmol/L (ref 98–111)
Creatinine, Ser: 2.78 mg/dL — ABNORMAL HIGH (ref 0.44–1.00)
GFR, Estimated: 18 mL/min — ABNORMAL LOW (ref >60)
Glucose, Bld: 116 mg/dL — ABNORMAL HIGH (ref 70–99)
Potassium: 3.8 mmol/L (ref 3.5–5.1)
Sodium: 135 mmol/L (ref 135–145)

## 2020-12-02 LAB — GLUCOSE, CAPILLARY
Glucose-Capillary: 116 mg/dL — ABNORMAL HIGH (ref 70–99)
Glucose-Capillary: 196 mg/dL — ABNORMAL HIGH (ref 70–99)
Glucose-Capillary: 149 mg/dL — ABNORMAL HIGH (ref 70–99)
Glucose-Capillary: 111 mg/dL — ABNORMAL HIGH (ref 70–99)

## 2020-12-02 LAB — CBC
HCT: 27.7 % — ABNORMAL LOW (ref 36.0–46.0)
Hemoglobin: 8.5 g/dL — ABNORMAL LOW (ref 12.0–15.0)
MCH: 31.1 pg (ref 26.0–34.0)
MCHC: 30.7 g/dL (ref 30.0–36.0)
MCV: 101.5 fL — ABNORMAL HIGH (ref 80.0–100.0)
Platelets: 140 10*3/uL — ABNORMAL LOW (ref 150–400)
RBC: 2.73 MIL/uL — ABNORMAL LOW (ref 3.87–5.11)
RDW: 21.9 % — ABNORMAL HIGH (ref 11.5–15.5)
WBC: 12.2 10*3/uL — ABNORMAL HIGH (ref 4.0–10.5)
nRBC: 0 % (ref 0.0–0.2)

## 2020-12-02 MED ORDER — CHLORHEXIDINE GLUCONATE CLOTH 2 % EX PADS
6.0000 | MEDICATED_PAD | Freq: Every day | CUTANEOUS | Status: DC
  Administered 2020-12-03 – 2020-12-06 (×4): 6 via TOPICAL

## 2020-12-02 MED ORDER — DARBEPOETIN ALFA 200 MCG/0.4ML IJ SOSY: 200.0000 ug | PREFILLED_SYRINGE | INTRAMUSCULAR | Status: DC

## 2020-12-02 MED ORDER — DARBEPOETIN ALFA 200 MCG/0.4ML IJ SOSY
200.0000 ug | PREFILLED_SYRINGE | Freq: Once | INTRAMUSCULAR | Status: DC
  Filled 2020-12-02: qty 0.4

## 2020-12-02 NOTE — Progress Notes (Signed)
[facility] Progress Note   Subjective:   HD yest-  removed 2500-  tolerated well -  no new c/o's-  she did not get up yesterday but plans to today   Objective Vitals:   12/01/20 1419 12/01/20 2027 12/02/20 0012 12/02/20 0351  BP: 124/66 (!) 123/51 (!) 116/52 (!) 115/57  Pulse: 75 71 67 65  Resp: 18 16 18 16   Temp: 98 F (36.7 C) 98.6 F (37 C) 98.6 F (37 C) 98.4 F (36.9 C)  TempSrc: Oral Oral Oral Oral  SpO2: 96% 100% 92% 99%  Weight:      Height:       Physical Exam General: Chronically ill appearing woman, NAD Heart: RRR; no murmur Lungs: CTA anteiorly Abdomen: soft, non-tender Extremities: 1+ pitting LE edema to knees. R TMA site bandaged. All L toes with dry gangrene. R thigh bandaged. Hip and sacrum not examined Dialysis Access: TDC in R chest, LUE AVF + bruit and sig hematoma  Additional Objective Labs: Basic Metabolic Panel: Recent Labs  Lab 11/30/20 0156 12/01/20 0115 12/02/20 0151  NA 136 135 135  K 3.3* 4.1 3.8  CL 100 102 99  CO2 26 26 27   GLUCOSE 120* 166* 116*  BUN 17 32* 20  CREATININE 2.73* 4.15* 2.78*  CALCIUM 7.6* 7.7* 7.9*   Liver Function Tests: Recent Labs  Lab 11/25/20 1412  AST 24  ALT 14  ALKPHOS 209*  BILITOT 0.6  PROT 6.1*  ALBUMIN 1.9*   CBC: Recent Labs  Lab 11/25/20 1412 11/25/20 1512 11/27/20 0202 11/28/20 0233 11/29/20 0102 11/30/20 0156 12/01/20 0115 12/02/20 0151  WBC 19.0*   < > 16.3* 13.0* 15.7* 15.4* 13.0* 12.2*  NEUTROABS 14.3*  --  12.3*  --   --   --   --   --   HGB 10.3*   < > 8.4* 7.9* 8.2* 7.1* 6.8* 8.5*  HCT 34.0*   < > 27.7* 25.6* 26.6* 22.7* 21.6* 27.7*  MCV 106.9*   < > 106.9* 107.6* 106.4* 106.1* 105.4* 101.5*  PLT 374   < > 228 185 197 148* 140* 140*   < > = values in this interval not displayed.   Blood Culture    Component Value Date/Time   SDES BLOOD RIGHT HAND 11/25/2020 2301   SPECREQUEST  11/25/2020 2301    BOTTLES DRAWN AEROBIC ONLY Blood Culture results may not  be optimal due to an inadequate volume of blood received in culture bottles   CULT  11/25/2020 2301    NO GROWTH 5 DAYS Performed at [facility], [place] [address] [place], [place] [address]    REPTSTATUS 12/01/2020 FINAL 11/25/2020 2301   Studies/Results: No results found.  Medications:  sodium thiosulfate infusion for calciphylaxis 25 g (11/29/20 1456)    (feeding supplement) PROSource Plus  30 mL Oral TID BM   amiodarone  200 mg Oral Daily   apixaban  5 mg Oral BID   vitamin C  500 mg Oral BID   aspirin EC  81 mg Oral Daily   atorvastatin  20 mg Oral Daily   Chlorhexidine Gluconate Cloth  6 each Topical Q0600   darbepoetin (ARANESP) injection - DIALYSIS  200 mcg Subcutaneous Once   [START ON 12/10/2020] darbepoetin (ARANESP) injection - DIALYSIS  200 mcg Intravenous Q Sat-HD   famotidine  20 mg Oral Daily   feeding supplement  237 mL Oral BID BM   folic acid  1 mg Oral Daily  insulin aspart  0-6 Units Subcutaneous TID WC   linagliptin  5 mg Oral Daily   multivitamin  1 tablet Oral QHS   sevelamer carbonate  800 mg Oral TID WC    Dialysis Orders: TTS at [facility] 3:45hr, 250/500 (new cannulation protocol AVF), EDW 59.5kg, 3K/2.5Ca, AVF + TDC, no heparin - Venofer 100mg  x 5 ordered - Mircera 138mcg IV q 2 weeks (last 6/14)   Assessment/Plan:  Extensive gangrenous wounds (all toes, R thigh, R hip, entire sacrum, L hip): Off abx. 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   Calciphylaxis wounds: B hip/sacrum. Plan to treat empirically with sodium thiosulfate and discontinue all Ca/Vit D/iron products.  ESRD:   HD per TTS schedule normally but needs to be changed to MWF for rehab- next HD 6/24- Saturday- then Monday to get on MWF  Use TDC while inpatient-  try to do in chair  Hypertension/volume: Hypotensive on admit - home meds on hold. Stable BPs now.  UF as tolerated.  Anemia: Hgb down to 7; not surprising, transfuse as needed- got one unit 6/23. Aranesp 2  weekly. 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  Metabolic bone disease: calciphylaxis-  CorrCa ok, Phos 6.2. D/c Vit D3 and changed binder from Auryxia -> Renvela. Using 2Ca bath with dialysis as well as sodium thiosulfate  Nutrition:  Alb very low, push protein  CAD  PAD  T2DM  A-fib on Eliquis: Heparin bridge for surgery.  GOC: Needs ongoing dialogue and will see how wounds progress.  For SNF placement   Louis Meckel, MD  12/02/2020, 8:35 AM  [facility]

## 2020-12-02 NOTE — Progress Notes (Signed)
Physical Therapy Treatment Patient Details Name: Kathryn Hancock MRN: [id number] DOB: [birth year] Today's Date: 12/02/2020    History of Present Illness pt is a 70 y/o female presenting 6/17 with worsening of gangrene-like changes on her bil toes, non-healing wounds right thigh and non healing ulcers of bil hips.  Pt s/p righ TMA and excisional debridement of right thight 6/20.  PMHx:  CAD, DM, ESRD, HTN, PAF, AAA.    PT Comments    PTA called back into room to assist pt back to bed, RN not available to assist at this time. Pt had been up to chair for ~30 mins prior to second session. Pt received in chair, c/o severe pain at wound sites and agreeable to further transfer training and gait training. Pt performed sit<>stand from chair and EOB heights with up to +2 modA and RW support, able to progress gait up to 69ft but remains limited due to pain and bowel urgency/incontinence. Pt reports she is more comfortable seated upright in chair due to bilateral sores on her bottom, may be able to sit up longer for HD treatments if able to sit upright without back support and legs in dependent position although this would not be ideal due to recent R TMA surgery, MD/RN notified. Pt continues to benefit from PT services to progress toward functional mobility goals.   Follow Up Recommendations  Home health PT;Supervision/Assistance - 24 hour;Other (comment) (pt wishes to attempt home with 24 hour assist from her daughter at a w/c level if needed, but wants to walk short distance.)     Equipment Recommendations  None recommended by PT;Other (comment) (TBA)    Recommendations for Other Services       Precautions / Restrictions Precautions Precautions: Fall Restrictions Weight Bearing Restrictions: Yes RLE Weight Bearing: Partial weight bearing Other Position/Activity Restrictions: PWB through R heel.    Mobility  Bed Mobility Overal bed mobility: Needs Assistance Bed Mobility: Rolling;Sidelying to  Sit Rolling: Min assist Sidelying to sit: Mod assist       General bed mobility comments: cues for log roll with good initiation but needs min/modA for LE over EOB and trunk rise    Transfers Overall transfer level: Needs assistance Equipment used: Rolling walker (2 wheeled) Transfers: Sit to/from [facility] Sit to Stand: Mod assist;+2 physical assistance Stand pivot transfers: Mod assist;+2 safety/equipment       General transfer comment: chair>EOB, then EOB>BSC  Ambulation/Gait Ambulation/Gait assistance: Mod assist;+2 physical assistance Gait Distance (Feet): 4 Feet (2.45ft to EOB from chair, then 44ft to BSC from EOB) Assistive device: Rolling walker (2 wheeled) Gait Pattern/deviations: Step-to pattern;Antalgic     General Gait Details: limited due to wound and R foot pain, impulsive to sit prior to reaching chair, needs assist to manage RW.          Balance Overall balance assessment: Needs assistance Sitting-balance support: Feet supported;Feet unsupported;Single extremity supported Sitting balance-Leahy Scale: Fair     Standing balance support: Bilateral upper extremity supported Standing balance-Leahy Scale: Poor Standing balance comment: modA of 1 for static standing, +2 for dynamic tasks                            Cognition Arousal/Alertness: Awake/alert Behavior During Therapy: WFL for tasks assessed/performed;Anxious Overall Cognitive Status: Within Functional Limits for tasks assessed  General Comments: pt tearful due to pain, eager to reposition      Exercises Other Exercises Other Exercises: supine BLE AROM: heel slides, ankle pumps x10 reps Other Exercises: seated BLE AROM: LAQ, hip flexion x10 reps    General Comments General comments (skin integrity, edema, etc.): VSS on RA      Pertinent Vitals/Pain Pain Assessment: Faces Faces Pain Scale: Hurts whole lot Pain  Location: R foot/amp site Pain Descriptors / Indicators: Grimacing;Moaning;Throbbing Pain Intervention(s): Limited activity within patient's tolerance;Monitored during session;Patient requesting pain meds-RN notified;Repositioned     PT Goals (current goals can now be found in the care plan section) Acute Rehab PT Goals Patient Stated Goal: want to get home. walk if I can, w/c if I need to. PT Goal Formulation: With patient Time For Goal Achievement: 12/14/20 Potential to Achieve Goals: Fair Progress towards PT goals: Progressing toward goals    Frequency    Min 3X/week      PT Plan Current plan remains appropriate       AM-PAC PT "6 Clicks" Mobility   Outcome Measure  Help needed turning from your back to your side while in a flat bed without using bedrails?: A Little Help needed moving from lying on your back to sitting on the side of a flat bed without using bedrails?: A Lot Help needed moving to and from a bed to a chair (including a wheelchair)?: A Lot Help needed standing up from a chair using your arms (e.g., wheelchair or bedside chair)?: A Lot Help needed to walk in hospital room?: A Lot Help needed climbing 3-5 steps with a railing? : Total 6 Click Score: 12    End of Session Equipment Utilized During Treatment: Gait belt Activity Tolerance: Patient tolerated treatment well;Patient limited by pain Patient left: with call bell/phone within reach;in chair;with nursing/sitter in room (seated on BSC, NT and mobility tech in room) Nurse Communication: Mobility status;Patient requests pain meds PT Visit Diagnosis: Other abnormalities of gait and mobility (R26.89);Muscle weakness (generalized) (M62.81);Difficulty in walking, not elsewhere classified (R26.2);Pain Pain - Right/Left:  (bil) Pain - part of body: Hip;Leg;Ankle and joints of foot (multtiple painful wounds and acute pain R thigh and amputation site.)     Time: 8841-6606 PT Time Calculation (min) (ACUTE  ONLY): 8 min  Charges:  $Therapeutic Activity: 8-22 mins                     Livianna Petraglia P., PTA Acute Rehabilitation Services Pager: [phone] Office: [place] 12/02/2020, 5:26 PM

## 2020-12-02 NOTE — Progress Notes (Addendum)
Physical Therapy Treatment Patient Details Name: Kathryn Hancock MRN: [id number] DOB: [birth year] Today's Date: 12/02/2020    History of Present Illness pt is a 70 y/o female presenting 6/17 with worsening of gangrene-like changes on her bil toes, non-healing wounds right thigh and non healing ulcers of bil hips.  Pt s/p righ TMA and excisional debridement of right thight 6/20.  PMHx:  CAD, DM, ESRD, HTN, PAF, AAA.    PT Comments    Pt received in supine, agreeable to therapy session and with good participation and tolerance for transfer training and supine/seated exercises. Pt with increased pain in seated posture but agreeable to attempt sitting up 30 minutes or more in chair at end of session. Reviewed pressure offloading strategies and repositioning in chair. Pt continues to benefit from PT services to progress toward functional mobility goals.    Follow Up Recommendations  Home health PT;Supervision/Assistance - 24 hour;Other (comment) (pt wishes to attempt home with 24 hour assist from her daughter at a w/c level if needed, but wants to walk short distance.)     Equipment Recommendations  None recommended by PT;Other (comment) (TBA)    Recommendations for Other Services       Precautions / Restrictions Precautions Precautions: Fall Restrictions Weight Bearing Restrictions: Yes RLE Weight Bearing: Partial weight bearing Other Position/Activity Restrictions: PWB through R heel.    Mobility  Bed Mobility Overal bed mobility: Needs Assistance Bed Mobility: Rolling;Sidelying to Sit Rolling: Min assist Sidelying to sit: Mod assist       General bed mobility comments: cues for log roll with good initiation but needs min/modA for LE over EOB and trunk rise    Transfers Overall transfer level: Needs assistance Equipment used: Rolling walker (2 wheeled) Transfers: Sit to/from [facility] Sit to Stand: Mod assist;+2 physical assistance Stand pivot transfers: Mod  assist;+2 safety/equipment       General transfer comment: air bed unable to deflate or totally flatten due to bed power issues (RN aware, outlet damaged in wall), from EOB<>RW with modA +2 for safety and RW>recliner  Ambulation/Gait             General Gait Details: ~2.91ft to chair with RW and modA +2; limited due to wound and R foot pain   Stairs             Wheelchair Mobility    Modified Rankin (Stroke Patients Only)       Balance Overall balance assessment: Needs assistance Sitting-balance support: Feet supported;Feet unsupported;Single extremity supported Sitting balance-Leahy Scale: Fair     Standing balance support: Bilateral upper extremity supported Standing balance-Leahy Scale: Poor Standing balance comment: modA of 1 for static standing, +2 for dynamic tasks                            Cognition Arousal/Alertness: Awake/alert Behavior During Therapy: WFL for tasks assessed/performed;Anxious Overall Cognitive Status: Within Functional Limits for tasks assessed                                 General Comments: anxious to attempt but participatory as able      Exercises Other Exercises Other Exercises: supine BLE AROM: heel slides, ankle pumps x10 reps Other Exercises: seated BLE AROM: LAQ, hip flexion x10 reps    General Comments General comments (skin integrity, edema, etc.): VSS on RA      Pertinent  Vitals/Pain Pain Assessment: Faces Faces Pain Scale: Hurts even more Pain Location: R foot/amp site Pain Descriptors / Indicators: Grimacing;Moaning;Throbbing Pain Intervention(s): Limited activity within patient's tolerance;Monitored during session;Patient requesting pain meds-RN notified;Repositioned;Other (comment) (geomat cushion ordered but not in room yet at time of session)    Home Living                      Prior Function            PT Goals (current goals can now be found in the care plan  section) Acute Rehab PT Goals Patient Stated Goal: want to get home. walk if I can, w/c if I need to. PT Goal Formulation: With patient Time For Goal Achievement: 12/14/20 Potential to Achieve Goals: Fair Progress towards PT goals: Progressing toward goals    Frequency    Min 3X/week      PT Plan Current plan remains appropriate    Co-evaluation              AM-PAC PT "6 Clicks" Mobility   Outcome Measure  Help needed turning from your back to your side while in a flat bed without using bedrails?: A Little Help needed moving from lying on your back to sitting on the side of a flat bed without using bedrails?: A Lot Help needed moving to and from a bed to a chair (including a wheelchair)?: A Lot Help needed standing up from a chair using your arms (e.g., wheelchair or bedside chair)?: A Lot Help needed to walk in hospital room?: Total Help needed climbing 3-5 steps with a railing? : Total 6 Click Score: 11    End of Session Equipment Utilized During Treatment: Gait belt Activity Tolerance: Patient tolerated treatment well;Patient limited by pain Patient left: with call bell/phone within reach;in chair;with chair alarm set Nurse Communication: Mobility status;Patient requests pain meds PT Visit Diagnosis: Other abnormalities of gait and mobility (R26.89);Muscle weakness (generalized) (M62.81);Difficulty in walking, not elsewhere classified (R26.2);Pain Pain - Right/Left:  (bil) Pain - part of body: Hip;Leg;Ankle and joints of foot (multtiple painful wounds and acute pain R thigh and amputation site.)     Time: 4825-0037 PT Time Calculation (min) (ACUTE ONLY): 24 min  Charges:  $Therapeutic Activity: 8-22 mins                     Tyonna Talerico P., PTA Acute Rehabilitation Services Pager: [phone] Office: [place] 12/02/2020, 5:18 PM

## 2020-12-02 NOTE — Progress Notes (Signed)
Patient has refused dressing change to metatarsal amputation site, she states Dr. Scot Dock wants to view or do dressing himself this A.M. Patient also refused pain medication, opting to wait until dressing is underway.

## 2020-12-02 NOTE — Progress Notes (Signed)
Outpatient HD clinic/[facility] is able to provide patient with a MWF schedule with an 11:20am chair time. She needs to arrive at 11:00am. Navigator appreciates this accommodation so that she can discharge to SNF. Navigator informed CSW. Patient will still need to be able to sit in a chair before she can safely discharge. As long as patient is able to sit in her hospital room recliner for at least 4-5 hours at a time, she does not necessarily need to have a dialysis treatment in the recliner prior to discharge.   Alphonzo Cruise, [place] [facility] [phone]

## 2020-12-02 NOTE — Progress Notes (Addendum)
  Progress Note    12/02/2020 7:47 AM 4 Days Post-Op  Subjective:  wants Dr. Scot Dock to change dressing  afebrile  Vitals:   12/02/20 0012 12/02/20 0351  BP: (!) 116/52 (!) 115/57  Pulse: 67 65  Resp: 18 16  Temp: 98.6 F (37 C) 98.4 F (36.9 C)  SpO2: 92% 99%    Physical Exam: Cardiac:  regular Lungs:  non labored Incisions:   groin and proximal thigh incisions healing nicely.  Right TMA healing nicely - lateral edge with dark area    Extremities:  palpable left DP pulse   CBC    Component Value Date/Time   WBC 12.2 (H) 12/02/2020 0151   RBC 2.73 (L) 12/02/2020 0151   HGB 8.5 (L) 12/02/2020 0151   HGB 9.8 (L) 05/26/2020 1158   HCT 27.7 (L) 12/02/2020 0151   HCT 29.6 (L) 05/26/2020 1158   PLT 140 (L) 12/02/2020 0151   PLT 286 05/26/2020 1158   MCV 101.5 (H) 12/02/2020 0151   MCV 100 (H) 05/26/2020 1158   MCH 31.1 12/02/2020 0151   MCHC 30.7 12/02/2020 0151   RDW 21.9 (H) 12/02/2020 0151   RDW 14.1 05/26/2020 1158   LYMPHSABS 2.0 11/27/2020 0202   MONOABS 0.9 11/27/2020 0202   EOSABS 0.2 11/27/2020 0202   BASOSABS 0.1 11/27/2020 0202    BMET    Component Value Date/Time   NA 135 12/02/2020 0151   NA 139 05/26/2020 1158   K 3.8 12/02/2020 0151   CL 99 12/02/2020 0151   CO2 27 12/02/2020 0151   GLUCOSE 116 (H) 12/02/2020 0151   BUN 20 12/02/2020 0151   BUN 75 (H) 05/26/2020 1158   CREATININE 2.78 (H) 12/02/2020 0151   CALCIUM 7.9 (L) 12/02/2020 0151   GFRNONAA 18 (L) 12/02/2020 0151   GFRAA 16 (L) 05/26/2020 1158    INR    Component Value Date/Time   INR 1.3 (H) 11/28/2020 0233     Intake/Output Summary (Last 24 hours) at 12/02/2020 0747 Last data filed at 12/01/2020 1318 Gross per 24 hour  Intake --  Output 2000 ml  Net -2000 ml     Assessment/Plan:  70 y.o. female is s/p:  Right TMA and I&D right saphenectomy  4 Days Post-Op   -TMA looks good but there is an area on the lateral aspect that is darkened.  Will allow time for  healing.  Continue daily dressing changes.  -right thigh wounds as above with good granulation tissue.  Continue bid wet to dry dressing changes.   -pt okay for dc to SNF from vascular standpoint.  Will check back on Monday if pt is still here.  -f/u with Dr. Scot Dock in a couple of weeks.  Our office will arrange this appt.      Leontine Locket, PA-C [facility] [phone] 12/02/2020 7:47 AM  I have interviewed the patient and examined the patient. I agree with the findings by the PA.  The right TMA's so far is healing well.  The saphenectomy wound has good granulation tissue and will need continuous meticulous wound care.  I will check back Monday if she still here.  Otherwise I will continue to follow her as an outpatient.  Gae Gallop, MD

## 2020-12-02 NOTE — Progress Notes (Signed)
PROGRESS NOTE  Kathryn Hancock UXN:[id number] DOB: 1950-11-07   PCP: Gala Lewandowsky, MD  Patient is from: Home  DOA: 11/25/2020 LOS: 7  Chief complaints:  Chief Complaint  Patient presents with   Wound Infection     Brief Narrative / Interim history: 70 year old F with PMH of ESRD on HD TTS, PAF on Eliquis, IDDM-2, CAD/CABG, PAD with b/l bypass, unhealing right thigh wound after right femoropopliteal bypass, HTN and HLD presenting with worsening gangrenous changes of all bilateral toes and nonhealing wound of right thigh and bilateral hips.  Vascular surgery consulted.  Patient underwent right TMA and excisional debridement of the right thigh with Dr. Scot Dock on on 6/20.   Plan for discharge to SNF once she tolerates HD on chair.   Subjective: Seen and examined earlier this morning.  No major events overnight of this morning.  No complaints.  She denies chest or surgical site pain, shortness of breath, GI or UTI symptoms.  Objective: Vitals:   12/02/20 0012 12/02/20 0351 12/02/20 0843 12/02/20 1222  BP: (!) 116/52 (!) 115/57 (!) 127/46 (!) 116/47  Pulse: 67 65 69 71  Resp: 18 16 17 17   Temp: 98.6 F (37 C) 98.4 F (36.9 C) 98.5 F (36.9 C) 98.1 F (36.7 C)  TempSrc: Oral Oral Oral Oral  SpO2: 92% 99% 99% 99%  Weight:      Height:        Intake/Output Summary (Last 24 hours) at 12/02/2020 1540 Last data filed at 12/02/2020 1228 Gross per 24 hour  Intake 720 ml  Output 10 ml  Net 710 ml   Filed Weights   12/01/20 0317 12/01/20 0942 12/01/20 1339  Weight: 73.8 kg 73.8 kg 71.8 kg    Examination:  GENERAL: No apparent distress.  Nontoxic. HEENT: MMM.  Vision and hearing grossly intact.  NECK: Supple.  No apparent JVD.  RESP: 99% on RA.  No IWOB.  Fair aeration bilaterally. CVS:  RRR. Heart sounds normal.  ABD/GI/GU: BS+. Abd soft, NTND.  MSK/EXT:  Moves extremities. No apparent deformity. No edema.  SKIN: Ace wrap and dressing over right foot and right thigh DCI.   +1 edema in RLE NEURO: Awake, alert and oriented appropriately.  No apparent focal neuro deficit. PSYCH: Calm. Normal affect.   Procedures:  6/20-right TMA and excisional debridement of right thigh by Dr. Scot Dock  Microbiology summarized: COVID-19 and influenza PCR nonreactive. Blood cultures NGTD.   Assessment & Plan: Nonhealing ulcer of the right medial thigh at the site of saphenectomy incision Bilateral PAD with bilateral gangrenous changes in toes -S/p right TMA and excisional debridement by vascular surgery on 6/20 -VVS recs: Daily dressing changes for right TMA wound and BID wet-to-dry dressing changes for right thigh wound -Outpatient follow-up with Dr. Scot Dock in 2 weeks -Continue statin and aspirin. -Sepsis ruled out.  SIRS resolving.  Blood cultures negative. 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  Needs to tolerate HD in chair -HD schedule mild change  History of CAD/CABG: Stable. -Continue home meds  Paroxysmal atrial fibrillation: Rate controlled. -Continue amiodarone and Eliquis  Diabetes mellitus -Sliding scale insulin, Tradjenta   ABLA superimposed on anemia of renal disease: Surgical blood loss? Recent Labs    11/01/20 0102 11/25/20 1412 11/25/20 1512 11/26/20 0550 11/27/20 0202 11/28/20 0233 11/29/20 0102 11/30/20 0156 12/01/20 0115 12/02/20 0151  HGB 9.2* 10.3* 9.2* 8.1* 8.4* 7.9* 8.2* 7.1*  6.8* 8.5*  -Transfused 1 unit on 6/23 with appropriate response. -Monitor H&H  Thrombocytopenia: Relatively stable now. Recent Labs  Lab 11/26/20 0550 11/27/20 0202 11/28/20 0233 11/29/20 0102 11/30/20 0156 12/01/20 0115 12/02/20 0151  PLT 247 228 185 197 148* 140* 140*  -Continue monitoring   Moderate malnutrition Body mass index is 28.04 kg/m. Nutrition Problem: Moderate Malnutrition Etiology: chronic illness (PAD, ESRD on  HD) Signs/Symptoms: mild fat depletion, moderate fat depletion, mild muscle depletion, moderate muscle depletion Interventions: MVI, Prostat, Magic cup, Glucerna shake  Pressure skin injury: Pressure Injury Coccyx Medial Stage 3 -  Full thickness tissue loss. Subcutaneous fat may be visible but bone, tendon or muscle are NOT exposed. 2 cm x 1 cm shallow bed with yellow/white (Active)     Location: Coccyx  Location Orientation: Medial  Staging: Stage 3 -  Full thickness tissue loss. Subcutaneous fat may be visible but bone, tendon or muscle are NOT exposed.  Wound Description (Comments): 2 cm x 1 cm shallow bed with yellow/white  Present on Admission: Yes     Pressure Injury 10/31/20 Buttocks Right;Upper Unstageable - Full thickness tissue loss in which the base of the injury is covered by slough (yellow, tan, gray, green or brown) and/or eschar (tan, brown or black) in the wound bed. 2 cm x 1.5 cm eschar (Active)  10/31/20 1500  Location: Buttocks  Location Orientation: Right;Upper  Staging: Unstageable - Full thickness tissue loss in which the base of the injury is covered by slough (yellow, tan, gray, green or brown) and/or eschar (tan, brown or black) in the wound bed.  Wound Description (Comments): 2 cm x 1.5 cm eschar  Present on Admission: Yes     Pressure Injury 10/31/20 Buttocks Right;Lower Unstageable - Full thickness tissue loss in which the base of the injury is covered by slough (yellow, tan, gray, green or brown) and/or eschar (tan, brown or black) in the wound bed. 1 cm x 1 cm eschar (Active)  10/31/20 1500  Location: Buttocks  Location Orientation: Right;Lower  Staging: Unstageable - Full thickness tissue loss in which the base of the injury is covered by slough (yellow, tan, gray, green or brown) and/or eschar (tan, brown or black) in the wound bed.  Wound Description (Comments): 1 cm x 1 cm eschar  Present on Admission: Yes     Pressure Injury 10/31/20 Heel  Posterior;Right Unstageable - Full thickness tissue loss in which the base of the injury is covered by slough (yellow, tan, gray, green or brown) and/or eschar (tan, brown or black) in the wound bed. eschar (Active)  10/31/20 1500  Location: Heel  Location Orientation: Posterior;Right  Staging: Unstageable - Full thickness tissue loss in which the base of the injury is covered by slough (yellow, tan, gray, green or brown) and/or eschar (tan, brown or black) in the wound bed.  Wound Description (Comments): eschar  Present on Admission: Yes     Pressure Injury 11/25/20 Thigh Right;Medial Stage 3 -  Full thickness tissue loss. Subcutaneous fat may be visible but bone, tendon or muscle are NOT exposed. (Active)  11/25/20 1945  Location: Thigh  Location Orientation: Right;Medial  Staging: Stage 3 -  Full thickness tissue loss. Subcutaneous fat may be visible but bone, tendon or muscle are NOT exposed.  Wound Description (Comments):   Present on Admission: Yes     Pressure Injury Flank Left Unstageable - Full thickness tissue loss in which the base of the injury is covered by slough (yellow, tan, gray,  green or brown) and/or eschar (tan, brown or black) in the wound bed. (Active)     Location: Flank  Location Orientation: Left  Staging: Unstageable - Full thickness tissue loss in which the base of the injury is covered by slough (yellow, tan, gray, green or brown) and/or eschar (tan, brown or black) in the wound bed.  Wound Description (Comments):   Present on Admission: Yes     Pressure Injury 11/28/20 Buttocks Left Unstageable - Full thickness tissue loss in which the base of the injury is covered by slough (yellow, tan, gray, green or brown) and/or eschar (tan, brown or black) in the wound bed. (Active)  11/28/20 0826  Location: Buttocks  Location Orientation: Left  Staging: Unstageable - Full thickness tissue loss in which the base of the injury is covered by slough (yellow, tan, gray,  green or brown) and/or eschar (tan, brown or black) in the wound bed.  Wound Description (Comments):   Present on Admission:      Pressure Injury 11/28/20 Buttocks Right Unstageable - Full thickness tissue loss in which the base of the injury is covered by slough (yellow, tan, gray, green or brown) and/or eschar (tan, brown or black) in the wound bed. (Active)  11/28/20 0826  Location: Buttocks  Location Orientation: Right  Staging: Unstageable - Full thickness tissue loss in which the base of the injury is covered by slough (yellow, tan, gray, green or brown) and/or eschar (tan, brown or black) in the wound bed.  Wound Description (Comments):   Present on Admission: Yes     Pressure Injury 11/28/20 Hip Left Unstageable - Full thickness tissue loss in which the base of the injury is covered by slough (yellow, tan, gray, green or brown) and/or eschar (tan, brown or black) in the wound bed. (Active)  11/28/20 0827  Location: Hip  Location Orientation: Left  Staging: Unstageable - Full thickness tissue loss in which the base of the injury is covered by slough (yellow, tan, gray, green or brown) and/or eschar (tan, brown or black) in the wound bed.  Wound Description (Comments):   Present on Admission: Yes   DVT prophylaxis:   apixaban (ELIQUIS) tablet 5 mg  Code Status: Full code Family Communication: Patient and/or RN. Available if any question.  Level of care: Telemetry Medical Status is: Inpatient  Remains inpatient appropriate because:Unsafe d/c plan and Inpatient level of care appropriate due to severity of illness  Dispo: The patient is from: Home              Anticipated d/c is to: SNF              Patient currently is not medically stable to d/c.   Difficult to place patient No       Consultants:  Nephrology Vascular surgery   Sch Meds:  Scheduled Meds:  (feeding supplement) PROSource Plus  30 mL Oral TID BM   amiodarone  200 mg Oral Daily   apixaban  5 mg Oral  BID   vitamin C  500 mg Oral BID   aspirin EC  81 mg Oral Daily   atorvastatin  20 mg Oral Daily   Chlorhexidine Gluconate Cloth  6 each Topical Q0600   [START ON 12/03/2020] Chlorhexidine Gluconate Cloth  6 each Topical Q0600   [START ON 12/10/2020] darbepoetin (ARANESP) injection - DIALYSIS  200 mcg Intravenous Q Sat-HD   famotidine  20 mg Oral Daily   feeding supplement  237 mL Oral BID BM  folic acid  1 mg Oral Daily   insulin aspart  0-6 Units Subcutaneous TID WC   linagliptin  5 mg Oral Daily   multivitamin  1 tablet Oral QHS   sevelamer carbonate  800 mg Oral TID WC   Continuous Infusions:  sodium thiosulfate infusion for calciphylaxis 25 g (11/29/20 1456)   PRN Meds:.diphenhydrAMINE **AND** acetaminophen, acetaminophen, bisacodyl, diclofenac Sodium, docusate sodium, fluticasone, HYDROmorphone (DILAUDID) injection, montelukast, ondansetron (ZOFRAN) IV, oxyCODONE  Antimicrobials: Anti-infectives (From admission, onward)    Start     Dose/Rate Route Frequency Ordered Stop   11/29/20 1800  ceFEPIme (MAXIPIME) 2 g in sodium chloride 0.9 % 100 mL IVPB        2 g 200 mL/hr over 30 Minutes Intravenous  Once 11/29/20 0908 11/29/20 2046   11/26/20 1700  ceFEPIme (MAXIPIME) 1 g in sodium chloride 0.9 % 100 mL IVPB  Status:  Discontinued        1 g 200 mL/hr over 30 Minutes Intravenous Every 24 hours 11/25/20 1713 11/29/20 0908   11/26/20 1200  vancomycin (VANCOREADY) IVPB 750 mg/150 mL  Status:  Discontinued        750 mg 150 mL/hr over 60 Minutes Intravenous Every T-Th-Sa (Hemodialysis) 11/25/20 1714 11/29/20 0909   11/26/20 1126  vancomycin (VANCOREADY) 750 MG/150ML IVPB       Note to Pharmacy: Cherylann Banas   : cabinet override      11/26/20 1126 11/26/20 1200   11/26/20 1125  vancomycin (VANCOREADY) 750 MG/150ML IVPB       Note to Pharmacy: Cherylann Banas   : cabinet override      11/26/20 1125 11/26/20 1159   11/25/20 1700  vancomycin (VANCOREADY) IVPB 1250 mg/250 mL        1,250  mg 166.7 mL/hr over 90 Minutes Intravenous  Once 11/25/20 1645 11/26/20 0053   11/25/20 1645  ceFEPIme (MAXIPIME) 2 g in sodium chloride 0.9 % 100 mL IVPB        2 g 200 mL/hr over 30 Minutes Intravenous  Once 11/25/20 1644 11/25/20 2255   11/25/20 1500  clindamycin (CLEOCIN) IVPB 600 mg        600 mg 100 mL/hr over 30 Minutes Intravenous  Once 11/25/20 1459 11/25/20 1606        I have personally reviewed the following labs and images: CBC: Recent Labs  Lab 11/27/20 0202 11/28/20 0233 11/29/20 0102 11/30/20 0156 12/01/20 0115 12/02/20 0151  WBC 16.3* 13.0* 15.7* 15.4* 13.0* 12.2*  NEUTROABS 12.3*  --   --   --   --   --   HGB 8.4* 7.9* 8.2* 7.1* 6.8* 8.5*  HCT 27.7* 25.6* 26.6* 22.7* 21.6* 27.7*  MCV 106.9* 107.6* 106.4* 106.1* 105.4* 101.5*  PLT 228 185 197 148* 140* 140*   BMP &GFR Recent Labs  Lab 11/28/20 0233 11/29/20 0102 11/30/20 0156 12/01/20 0115 12/02/20 0151  NA 137 137 136 135 135  K 3.0* 3.3* 3.3* 4.1 3.8  CL 101 103 100 102 99  CO2 24 23 26 26 27   GLUCOSE 144* 147* 120* 166* 116*  BUN 24* 35* 17 32* 20  CREATININE 3.87* 4.66* 2.73* 4.15* 2.78*  CALCIUM 7.8* 8.2* 7.6* 7.7* 7.9*   Estimated Creatinine Clearance: 18.7 mL/min (A) (by C-G formula based on SCr of 2.78 mg/dL (H)). Liver & Pancreas: No results for input(s): AST, ALT, ALKPHOS, BILITOT, PROT, ALBUMIN in the last 168 hours. No results for input(s): LIPASE, AMYLASE in the last 168 hours. No  results for input(s): AMMONIA in the last 168 hours. Diabetic: No results for input(s): HGBA1C in the last 72 hours. Recent Labs  Lab 12/01/20 0621 12/01/20 1418 12/01/20 2135 12/02/20 0613 12/02/20 1227  GLUCAP 124* 138* 117* 116* 196*   Cardiac Enzymes: No results for input(s): CKTOTAL, CKMB, CKMBINDEX, TROPONINI in the last 168 hours. No results for input(s): PROBNP in the last 8760 hours. Coagulation Profile: Recent Labs  Lab 11/28/20 0233  INR 1.3*   Thyroid Function Tests: No results  for input(s): TSH, T4TOTAL, FREET4, T3FREE, THYROIDAB in the last 72 hours. Lipid Profile: No results for input(s): CHOL, HDL, LDLCALC, TRIG, CHOLHDL, LDLDIRECT in the last 72 hours. Anemia Panel: No results for input(s): VITAMINB12, FOLATE, FERRITIN, TIBC, IRON, RETICCTPCT in the last 72 hours. Urine analysis:    Component Value Date/Time   COLORURINE AMBER (A) 09/30/2020 1035   APPEARANCEUR CLOUDY (A) 09/30/2020 1035   LABSPEC 1.012 09/30/2020 1035   PHURINE 8.0 09/30/2020 1035   GLUCOSEU NEGATIVE 09/30/2020 1035   HGBUR NEGATIVE 09/30/2020 1035   BILIRUBINUR NEGATIVE 09/30/2020 1035   KETONESUR NEGATIVE 09/30/2020 1035   PROTEINUR 30 (A) 09/30/2020 1035   NITRITE NEGATIVE 09/30/2020 1035   LEUKOCYTESUR LARGE (A) 09/30/2020 1035   Sepsis Labs: Invalid input(s): PROCALCITONIN, [place]  Microbiology: Recent Results (from the past 240 hour(s))  Blood Culture (routine x 2)     Status: None   Collection Time: 11/25/20  2:06 PM   Specimen: BLOOD  Result Value Ref Range Status   Specimen Description BLOOD RIGHT ANTECUBITAL  Final   Special Requests   Final    BOTTLES DRAWN AEROBIC AND ANAEROBIC Blood Culture adequate volume   Culture   Final    NO GROWTH 5 DAYS Performed at [facility], [address], [place], [place] [address]    Report Status 11/30/2020 FINAL  Final  Resp Panel by RT-PCR (Flu A&B, Covid) Nasopharyngeal Swab     Status: None   Collection Time: 11/25/20  2:06 PM   Specimen: Nasopharyngeal Swab; Nasopharyngeal(NP) swabs in vial transport medium  Result Value Ref Range Status   SARS Coronavirus 2 by RT PCR NEGATIVE NEGATIVE Final    Comment: (NOTE) SARS-CoV-2 target nucleic acids are NOT DETECTED.  The SARS-CoV-2 RNA is generally detectable in upper respiratory specimens during the acute phase of infection. The lowest concentration of SARS-CoV-2 viral copies this assay can detect is 138 copies/mL. A negative result does not preclude  SARS-Cov-2 infection and should not be used as the sole basis for treatment or other patient management decisions. A negative result may occur with  improper specimen collection/handling, submission of specimen other than nasopharyngeal swab, presence of viral mutation(s) within the areas targeted by this assay, and inadequate number of viral copies(<138 copies/mL). A negative result must be combined with clinical observations, patient history, and epidemiological information. The expected result is Negative.  Fact Sheet for Patients:  EntrepreneurPulse.com.au  Fact Sheet for Healthcare Providers:  IncredibleEmployment.be  This test is no t yet approved or cleared by the [place] FDA and  has been authorized for detection and/or diagnosis of SARS-CoV-2 by FDA under an Emergency Use Authorization (EUA). This EUA will remain  in effect (meaning this test can be used) for the duration of the COVID-19 declaration under Section 564(b)(1) of the Act, 21 U.S.C.section 360bbb-3(b)(1), unless the authorization is terminated  or revoked sooner.       Influenza A by PCR NEGATIVE NEGATIVE Final   Influenza B by  PCR NEGATIVE NEGATIVE Final    Comment: (NOTE) The Xpert Xpress SARS-CoV-2/FLU/RSV plus assay is intended as an aid in the diagnosis of influenza from Nasopharyngeal swab specimens and should not be used as a sole basis for treatment. Nasal washings and aspirates are unacceptable for Xpert Xpress SARS-CoV-2/FLU/RSV testing.  Fact Sheet for Patients: EntrepreneurPulse.com.au  Fact Sheet for Healthcare Providers: IncredibleEmployment.be  This test is not yet approved or cleared by the [place] FDA and has been authorized for detection and/or diagnosis of SARS-CoV-2 by FDA under an Emergency Use Authorization (EUA). This EUA will remain in effect (meaning this test can be used) for the duration of  the COVID-19 declaration under Section 564(b)(1) of the Act, 21 U.S.C. section 360bbb-3(b)(1), unless the authorization is terminated or revoked.  Performed at [facility], [place] [address], [place], [place] [address]   Blood Culture (routine x 2)     Status: None   Collection Time: 11/25/20 11:01 PM   Specimen: BLOOD RIGHT HAND  Result Value Ref Range Status   Specimen Description BLOOD RIGHT HAND  Final   Special Requests   Final    BOTTLES DRAWN AEROBIC ONLY Blood Culture results may not be optimal due to an inadequate volume of blood received in culture bottles   Culture   Final    NO GROWTH 5 DAYS Performed at [facility], [place] [address] [place], [place] [address]    Report Status 12/01/2020 FINAL  Final    Radiology Studies: No results found.    Ledon Weihe T. [place]  If 7PM-7AM, please contact night-coverage [email or web address] 12/02/2020, 3:40 PM

## 2020-12-02 NOTE — Progress Notes (Signed)
Mobility Specialist - Progress Note   12/02/20 1531  Mobility  Activity Transferred:  Chair to bed;Transferred to/from [facility]  Level of Assistance +2 (takes two people)  Games developer wheel walker;BSC  Mobility Out of bed for toileting  Mobility Response Tolerated fair  Mobility performed by Mobility specialist;Nurse tech;Other (comment) (PTA)  $Mobility charge 1 Mobility   Assisted PTA in getting pt to bed from the recliner. Pt then needed to transfer to [facility] to pass a BM. Pt cleaned and was +2 from NT and myself to pivot back into bed. VSS throughout.   Pricilla Handler Mobility Specialist Mobility Specialist Phone: [phone]

## 2020-12-03 LAB — GLUCOSE, CAPILLARY
Glucose-Capillary: 139 mg/dL — ABNORMAL HIGH (ref 70–99)
Glucose-Capillary: 180 mg/dL — ABNORMAL HIGH (ref 70–99)
Glucose-Capillary: 120 mg/dL — ABNORMAL HIGH (ref 70–99)
Glucose-Capillary: 154 mg/dL — ABNORMAL HIGH (ref 70–99)

## 2020-12-03 LAB — CBC WITH DIFFERENTIAL/PLATELET
Abs Immature Granulocytes: 0.64 10*3/uL — ABNORMAL HIGH (ref 0.00–0.07)
Basophils Absolute: 0 10*3/uL (ref 0.0–0.1)
Basophils Relative: 0 %
Eosinophils Absolute: 0.2 10*3/uL (ref 0.0–0.5)
Eosinophils Relative: 1 %
HCT: 30.8 % — ABNORMAL LOW (ref 36.0–46.0)
Hemoglobin: 9.6 g/dL — ABNORMAL LOW (ref 12.0–15.0)
Immature Granulocytes: 5 %
Lymphocytes Relative: 11 %
Lymphs Abs: 1.4 10*3/uL (ref 0.7–4.0)
MCH: 31.6 pg (ref 26.0–34.0)
MCHC: 31.2 g/dL (ref 30.0–36.0)
MCV: 101.3 fL — ABNORMAL HIGH (ref 80.0–100.0)
Monocytes Absolute: 1.1 10*3/uL — ABNORMAL HIGH (ref 0.1–1.0)
Monocytes Relative: 9 %
Neutro Abs: 9 10*3/uL — ABNORMAL HIGH (ref 1.7–7.7)
Neutrophils Relative %: 74 %
Platelets: 155 10*3/uL (ref 150–400)
RBC: 3.04 MIL/uL — ABNORMAL LOW (ref 3.87–5.11)
RDW: 21.2 % — ABNORMAL HIGH (ref 11.5–15.5)
WBC: 12.4 10*3/uL — ABNORMAL HIGH (ref 4.0–10.5)
nRBC: 0 % (ref 0.0–0.2)

## 2020-12-03 LAB — RENAL FUNCTION PANEL
Albumin: 1.5 g/dL — ABNORMAL LOW (ref 3.5–5.0)
Anion gap: 6 (ref 5–15)
BUN: 38 mg/dL — ABNORMAL HIGH (ref 8–23)
CO2: 28 mmol/L (ref 22–32)
Calcium: 8.2 mg/dL — ABNORMAL LOW (ref 8.9–10.3)
Chloride: 104 mmol/L (ref 98–111)
Creatinine, Ser: 4.38 mg/dL — ABNORMAL HIGH (ref 0.44–1.00)
GFR, Estimated: 10 mL/min — ABNORMAL LOW (ref >60)
Glucose, Bld: 106 mg/dL — ABNORMAL HIGH (ref 70–99)
Phosphorus: 4.1 mg/dL (ref 2.5–4.6)
Potassium: 4.2 mmol/L (ref 3.5–5.1)
Sodium: 138 mmol/L (ref 135–145)

## 2020-12-03 LAB — MAGNESIUM: Magnesium: 2.2 mg/dL (ref 1.7–2.4)

## 2020-12-03 MED ORDER — DARBEPOETIN ALFA 200 MCG/0.4ML IJ SOSY
200.0000 ug | PREFILLED_SYRINGE | INTRAMUSCULAR | Status: DC
  Administered 2020-12-03: 200 ug via INTRAVENOUS
  Filled 2020-12-03: qty 0.4

## 2020-12-03 MED ORDER — DARBEPOETIN ALFA 200 MCG/0.4ML IJ SOSY
PREFILLED_SYRINGE | INTRAMUSCULAR | Status: AC
  Filled 2020-12-03: qty 0.4

## 2020-12-03 MED ORDER — HYDROMORPHONE HCL 1 MG/ML IJ SOLN
INTRAMUSCULAR | Status: AC
  Filled 2020-12-03: qty 1

## 2020-12-03 NOTE — Progress Notes (Signed)
Painted patient's pressure ulcers with betadine. Painful for patient. Placed foam dressings to cover areas after painting with betadine. Dry dressings were sticking to one of the wounds and had to use saline to remove the gauze.     Will continue to monitor

## 2020-12-03 NOTE — Progress Notes (Signed)
PROGRESS NOTE  Kathryn Hancock KDX:[id number] DOB: 1951-02-28   PCP: Gala Lewandowsky, MD  Patient is from: Home  DOA: 11/25/2020 LOS: 8  Chief complaints:  Chief Complaint  Patient presents with   Wound Infection     Brief Narrative / Interim history: 70 year old F with PMH of ESRD on HD TTS, PAF on Eliquis, IDDM-2, CAD/CABG, PAD with b/l bypass, unhealing right thigh wound after right femoropopliteal bypass, HTN and HLD presenting with worsening gangrenous changes of all bilateral toes and nonhealing wound of right thigh and bilateral hips.  Vascular surgery consulted.  Patient underwent right TMA and excisional debridement of the right thigh with Dr. Scot Dock on on 6/20.   Plan for discharge to SNF once she tolerates HD in chair.   Subjective: Seen and examined earlier this morning.  She said she had an episode of emesis about 2 AM this morning.  Currently feels fine.  Denies nausea or abdominal pain.  Denies chest pain or shortness of breath.   Objective: Vitals:   12/03/20 0245 12/03/20 0745 12/03/20 0800 12/03/20 1150  BP: (!) 144/64 (!) 130/57  (!) 133/58  Pulse: 73 68  75  Resp: 18 18  18   Temp: 98.2 F (36.8 C) (!) 97.5 F (36.4 C)  97.9 F (36.6 C)  TempSrc: Oral Oral Oral Oral  SpO2: 97% 98%  95%  Weight:      Height:        Intake/Output Summary (Last 24 hours) at 12/03/2020 1409 Last data filed at 12/03/2020 0029 Gross per 24 hour  Intake 798.32 ml  Output --  Net 798.32 ml   Filed Weights   12/01/20 0317 12/01/20 0942 12/01/20 1339  Weight: 73.8 kg 73.8 kg 71.8 kg    Examination:  GENERAL: No apparent distress.  Nontoxic. HEENT: MMM.  Vision and hearing grossly intact.  NECK: Supple.  No apparent JVD.  RESP: On RA.  No IWOB.  Fair aeration bilaterally. CVS:  RRR. Heart sounds normal.  ABD/GI/GU: BS+. Abd soft, NTND.  MSK/EXT:  Moves extremities. 1+ edema in RLE SKIN: Ace wrap and dressing over right foot and right thigh DCI. NEURO: Awake and alert.  Oriented appropriately.  No apparent focal neuro deficit. PSYCH: Calm. Normal affect.   Procedures:  6/20-right TMA and excisional debridement of right thigh by Dr. Scot Dock  Microbiology summarized: COVID-19 and influenza PCR nonreactive. Blood cultures NGTD.   Assessment & Plan: Nonhealing ulcer of the right medial thigh at the site of saphenectomy incision Bilateral PAD with bilateral gangrenous changes in toes Extensive pressure skin injury of bilateral buttocks and left hip as below -S/p right TMA and excisional debridement by vascular surgery on 6/20 -VVS recs: Daily dressing changes for right TMA wound and BID wet-to-dry dressing changes for right thigh wound -Outpatient follow-up with Dr. Scot Dock in 2 weeks -Continue statin and aspirin. -Sepsis ruled out.  SIRS resolving.  Blood cultures negative. -Appreciate WOCN recs. 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 -On sodium thiosulfate with HD per nephrology -Continue local wound care per WOCN recommendation   ESRD on HD TTS -Needs to tolerate HD in chair for 4 hours to continue outpatient HD.  This is difficult given her extensive wounds -Consult palliative care for goals of care discussion  History of CAD/CABG: Stable. -Continue home meds  Paroxysmal atrial fibrillation: Rate controlled. -Continue amiodarone and Eliquis  Diabetes mellitus -Sliding scale insulin, Tradjenta   ABLA superimposed on anemia of renal  disease: Surgical blood loss? Recent Labs    11/25/20 1412 11/25/20 1512 11/26/20 0550 11/27/20 0202 11/28/20 0233 11/29/20 0102 11/30/20 0156 12/01/20 0115 12/02/20 0151 12/03/20 0127  HGB 10.3* 9.2* 8.1* 8.4* 7.9* 8.2* 7.1* 6.8* 8.5* 9.6*  -Transfused 1 unit on 6/23 with appropriate response. -Monitor H&H  Thrombocytopenia: Resolved. Recent Labs  Lab 11/27/20 0202 11/28/20 0233 11/29/20 0102 11/30/20 0156 12/01/20 0115 12/02/20 0151  12/03/20 0127  PLT 228 185 197 148* 140* 140* 155  -Continue monitoring   Moderate malnutrition Body mass index is 28.04 kg/m. Nutrition Problem: Moderate Malnutrition Etiology: chronic illness (PAD, ESRD on HD) Signs/Symptoms: mild fat depletion, moderate fat depletion, mild muscle depletion, moderate muscle depletion Interventions: MVI, Prostat, Magic cup, Glucerna shake  Pressure skin injury: POA Pressure Injury Coccyx Medial Stage 3 -  Full thickness tissue loss. Subcutaneous fat may be visible but bone, tendon or muscle are NOT exposed. 2 cm x 1 cm shallow bed with yellow/white (Active)     Location: Coccyx  Location Orientation: Medial  Staging: Stage 3 -  Full thickness tissue loss. Subcutaneous fat may be visible but bone, tendon or muscle are NOT exposed.  Wound Description (Comments): 2 cm x 1 cm shallow bed with yellow/white  Present on Admission: Yes     Pressure Injury 11/25/20 Thigh Right;Medial Stage 3 -  Full thickness tissue loss. Subcutaneous fat may be visible but bone, tendon or muscle are NOT exposed. (Active)  11/25/20 1945  Location: Thigh  Location Orientation: Right;Medial  Staging: Stage 3 -  Full thickness tissue loss. Subcutaneous fat may be visible but bone, tendon or muscle are NOT exposed.  Wound Description (Comments):   Present on Admission: Yes     Pressure Injury Flank Left Unstageable - Full thickness tissue loss in which the base of the injury is covered by slough (yellow, tan, gray, green or brown) and/or eschar (tan, brown or black) in the wound bed. (Active)     Location: Flank  Location Orientation: Left  Staging: Unstageable - Full thickness tissue loss in which the base of the injury is covered by slough (yellow, tan, gray, green or brown) and/or eschar (tan, brown or black) in the wound bed.  Wound Description (Comments):   Present on Admission: Yes     Pressure Injury 11/28/20 Buttocks Left Unstageable - Full thickness tissue loss in  which the base of the injury is covered by slough (yellow, tan, gray, green or brown) and/or eschar (tan, brown or black) in the wound bed. (Active)  11/28/20 0826  Location: Buttocks  Location Orientation: Left  Staging: Unstageable - Full thickness tissue loss in which the base of the injury is covered by slough (yellow, tan, gray, green or brown) and/or eschar (tan, brown or black) in the wound bed.  Wound Description (Comments):   Present on Admission:      Pressure Injury 11/28/20 Buttocks Right Unstageable - Full thickness tissue loss in which the base of the injury is covered by slough (yellow, tan, gray, green or brown) and/or eschar (tan, brown or black) in the wound bed. (Active)  11/28/20 0826  Location: Buttocks  Location Orientation: Right  Staging: Unstageable - Full thickness tissue loss in which the base of the injury is covered by slough (yellow, tan, gray, green or brown) and/or eschar (tan, brown or black) in the wound bed.  Wound Description (Comments):   Present on Admission: Yes     Pressure Injury 11/28/20 Hip Left Unstageable - Full thickness tissue  loss in which the base of the injury is covered by slough (yellow, tan, gray, green or brown) and/or eschar (tan, brown or black) in the wound bed. (Active)  11/28/20 0827  Location: Hip  Location Orientation: Left  Staging: Unstageable - Full thickness tissue loss in which the base of the injury is covered by slough (yellow, tan, gray, green or brown) and/or eschar (tan, brown or black) in the wound bed.  Wound Description (Comments):   Present on Admission: Yes   DVT prophylaxis:   apixaban (ELIQUIS) tablet 5 mg  Code Status: Full code Family Communication: Patient and/or RN. Available if any question.  Level of care: Telemetry Medical Status is: Inpatient  Remains inpatient appropriate because:Unsafe d/c plan and Inpatient level of care appropriate due to severity of illness  Dispo: The patient is from: Home               Anticipated d/c is to: SNF              Patient currently is not medically stable to d/c.  Needs to tolerate HD in chair for 4 hours to continue outpatient HD.   Difficult to place patient No       Consultants:  Nephrology Vascular surgery Palliative medicine   Sch Meds:  Scheduled Meds:  (feeding supplement) PROSource Plus  30 mL Oral TID BM   amiodarone  200 mg Oral Daily   apixaban  5 mg Oral BID   vitamin C  500 mg Oral BID   aspirin EC  81 mg Oral Daily   atorvastatin  20 mg Oral Daily   Chlorhexidine Gluconate Cloth  6 each Topical Q0600   darbepoetin (ARANESP) injection - DIALYSIS  200 mcg Intravenous Q Sat-HD   famotidine  20 mg Oral Daily   feeding supplement  237 mL Oral BID BM   folic acid  1 mg Oral Daily   insulin aspart  0-6 Units Subcutaneous TID WC   linagliptin  5 mg Oral Daily   multivitamin  1 tablet Oral QHS   sevelamer carbonate  800 mg Oral TID WC   Continuous Infusions:  sodium thiosulfate infusion for calciphylaxis 25 g (11/29/20 1456)   PRN Meds:.diphenhydrAMINE **AND** acetaminophen, acetaminophen, bisacodyl, diclofenac Sodium, docusate sodium, fluticasone, HYDROmorphone (DILAUDID) injection, montelukast, ondansetron (ZOFRAN) IV, oxyCODONE  Antimicrobials: Anti-infectives (From admission, onward)    Start     Dose/Rate Route Frequency Ordered Stop   11/29/20 1800  ceFEPIme (MAXIPIME) 2 g in sodium chloride 0.9 % 100 mL IVPB        2 g 200 mL/hr over 30 Minutes Intravenous  Once 11/29/20 0908 11/29/20 2046   11/26/20 1700  ceFEPIme (MAXIPIME) 1 g in sodium chloride 0.9 % 100 mL IVPB  Status:  Discontinued        1 g 200 mL/hr over 30 Minutes Intravenous Every 24 hours 11/25/20 1713 11/29/20 0908   11/26/20 1200  vancomycin (VANCOREADY) IVPB 750 mg/150 mL  Status:  Discontinued        750 mg 150 mL/hr over 60 Minutes Intravenous Every T-Th-Sa (Hemodialysis) 11/25/20 1714 11/29/20 0909   11/26/20 1126  vancomycin (VANCOREADY) 750  MG/150ML IVPB       Note to Pharmacy: Cherylann Banas   : cabinet override      11/26/20 1126 11/26/20 1200   11/26/20 1125  vancomycin (VANCOREADY) 750 MG/150ML IVPB       Note to Pharmacy: Cherylann Banas   : cabinet override  11/26/20 1125 11/26/20 1159   11/25/20 1700  vancomycin (VANCOREADY) IVPB 1250 mg/250 mL        1,250 mg 166.7 mL/hr over 90 Minutes Intravenous  Once 11/25/20 1645 11/26/20 0053   11/25/20 1645  ceFEPIme (MAXIPIME) 2 g in sodium chloride 0.9 % 100 mL IVPB        2 g 200 mL/hr over 30 Minutes Intravenous  Once 11/25/20 1644 11/25/20 2255   11/25/20 1500  clindamycin (CLEOCIN) IVPB 600 mg        600 mg 100 mL/hr over 30 Minutes Intravenous  Once 11/25/20 1459 11/25/20 1606        I have personally reviewed the following labs and images: CBC: Recent Labs  Lab 11/27/20 0202 11/28/20 0233 11/29/20 0102 11/30/20 0156 12/01/20 0115 12/02/20 0151 12/03/20 0127  WBC 16.3*   < > 15.7* 15.4* 13.0* 12.2* 12.4*  NEUTROABS 12.3*  --   --   --   --   --  9.0*  HGB 8.4*   < > 8.2* 7.1* 6.8* 8.5* 9.6*  HCT 27.7*   < > 26.6* 22.7* 21.6* 27.7* 30.8*  MCV 106.9*   < > 106.4* 106.1* 105.4* 101.5* 101.3*  PLT 228   < > 197 148* 140* 140* 155   < > = values in this interval not displayed.   BMP &GFR Recent Labs  Lab 11/29/20 0102 11/30/20 0156 12/01/20 0115 12/02/20 0151 12/03/20 0127  NA 137 136 135 135 138  K 3.3* 3.3* 4.1 3.8 4.2  CL 103 100 102 99 104  CO2 23 26 26 27 28   GLUCOSE 147* 120* 166* 116* 106*  BUN 35* 17 32* 20 38*  CREATININE 4.66* 2.73* 4.15* 2.78* 4.38*  CALCIUM 8.2* 7.6* 7.7* 7.9* 8.2*  MG  --   --   --   --  2.2  PHOS  --   --   --   --  4.1   Estimated Creatinine Clearance: 11.8 mL/min (A) (by C-G formula based on SCr of 4.38 mg/dL (H)). Liver & Pancreas: Recent Labs  Lab 12/03/20 0127  ALBUMIN 1.5*   No results for input(s): LIPASE, AMYLASE in the last 168 hours. No results for input(s): AMMONIA in the last 168  hours. Diabetic: No results for input(s): HGBA1C in the last 72 hours. Recent Labs  Lab 12/02/20 1227 12/02/20 1640 12/02/20 2143 12/03/20 0608 12/03/20 1206  GLUCAP 196* 149* 111* 139* 180*   Cardiac Enzymes: No results for input(s): CKTOTAL, CKMB, CKMBINDEX, TROPONINI in the last 168 hours. No results for input(s): PROBNP in the last 8760 hours. Coagulation Profile: Recent Labs  Lab 11/28/20 0233  INR 1.3*   Thyroid Function Tests: No results for input(s): TSH, T4TOTAL, FREET4, T3FREE, THYROIDAB in the last 72 hours. Lipid Profile: No results for input(s): CHOL, HDL, LDLCALC, TRIG, CHOLHDL, LDLDIRECT in the last 72 hours. Anemia Panel: No results for input(s): VITAMINB12, FOLATE, FERRITIN, TIBC, IRON, RETICCTPCT in the last 72 hours. Urine analysis:    Component Value Date/Time   COLORURINE AMBER (A) 09/30/2020 1035   APPEARANCEUR CLOUDY (A) 09/30/2020 1035   LABSPEC 1.012 09/30/2020 1035   PHURINE 8.0 09/30/2020 1035   GLUCOSEU NEGATIVE 09/30/2020 1035   HGBUR NEGATIVE 09/30/2020 1035   BILIRUBINUR NEGATIVE 09/30/2020 1035   KETONESUR NEGATIVE 09/30/2020 1035   PROTEINUR 30 (A) 09/30/2020 1035   NITRITE NEGATIVE 09/30/2020 1035   LEUKOCYTESUR LARGE (A) 09/30/2020 1035   Sepsis Labs: Invalid input(s): PROCALCITONIN, [place]  Microbiology: Recent  Results (from the past 240 hour(s))  Blood Culture (routine x 2)     Status: None   Collection Time: 11/25/20  2:06 PM   Specimen: BLOOD  Result Value Ref Range Status   Specimen Description BLOOD RIGHT ANTECUBITAL  Final   Special Requests   Final    BOTTLES DRAWN AEROBIC AND ANAEROBIC Blood Culture adequate volume   Culture   Final    NO GROWTH 5 DAYS Performed at [facility], [address] [place], [place] [address]    Report Status 11/30/2020 FINAL  Final  Resp Panel by RT-PCR (Flu A&B, Covid) Nasopharyngeal Swab     Status: None   Collection Time: 11/25/20  2:06 PM   Specimen: Nasopharyngeal  Swab; Nasopharyngeal(NP) swabs in vial transport medium  Result Value Ref Range Status   SARS Coronavirus 2 by RT PCR NEGATIVE NEGATIVE Final    Comment: (NOTE) SARS-CoV-2 target nucleic acids are NOT DETECTED.  The SARS-CoV-2 RNA is generally detectable in upper respiratory specimens during the acute phase of infection. The lowest concentration of SARS-CoV-2 viral copies this assay can detect is 138 copies/mL. A negative result does not preclude SARS-Cov-2 infection and should not be used as the sole basis for treatment or other patient management decisions. A negative result may occur with  improper specimen collection/handling, submission of specimen other than nasopharyngeal swab, presence of viral mutation(s) within the areas targeted by this assay, and inadequate number of viral copies(<138 copies/mL). A negative result must be combined with clinical observations, patient history, and epidemiological information. The expected result is Negative.  Fact Sheet for Patients:  EntrepreneurPulse.com.au  Fact Sheet for Healthcare Providers:  IncredibleEmployment.be  This test is no t yet approved or cleared by the [place] FDA and  has been authorized for detection and/or diagnosis of SARS-CoV-2 by FDA under an Emergency Use Authorization (EUA). This EUA will remain  in effect (meaning this test can be used) for the duration of the COVID-19 declaration under Section 564(b)(1) of the Act, 21 U.S.C.section 360bbb-3(b)(1), unless the authorization is terminated  or revoked sooner.       Influenza A by PCR NEGATIVE NEGATIVE Final   Influenza B by PCR NEGATIVE NEGATIVE Final    Comment: (NOTE) The Xpert Xpress SARS-CoV-2/FLU/RSV plus assay is intended as an aid in the diagnosis of influenza from Nasopharyngeal swab specimens and should not be used as a sole basis for treatment. Nasal washings and aspirates are unacceptable for Xpert Xpress  SARS-CoV-2/FLU/RSV testing.  Fact Sheet for Patients: EntrepreneurPulse.com.au  Fact Sheet for Healthcare Providers: IncredibleEmployment.be  This test is not yet approved or cleared by the [place] FDA and has been authorized for detection and/or diagnosis of SARS-CoV-2 by FDA under an Emergency Use Authorization (EUA). This EUA will remain in effect (meaning this test can be used) for the duration of the COVID-19 declaration under Section 564(b)(1) of the Act, 21 U.S.C. section 360bbb-3(b)(1), unless the authorization is terminated or revoked.  Performed at [facility], [place] [address], [place], [place] [address]   Blood Culture (routine x 2)     Status: None   Collection Time: 11/25/20 11:01 PM   Specimen: BLOOD RIGHT HAND  Result Value Ref Range Status   Specimen Description BLOOD RIGHT HAND  Final   Special Requests   Final    BOTTLES DRAWN AEROBIC ONLY Blood Culture results may not be optimal due to an inadequate volume of blood received in culture bottles  Culture   Final    NO GROWTH 5 DAYS Performed at [facility], [place] [address] [place], [place] [address]    Report Status 12/01/2020 FINAL  Final    Radiology Studies: No results found.    Ruchama Kubicek T. [place]  If 7PM-7AM, please contact night-coverage [email or web address] 12/03/2020, 2:09 PM

## 2020-12-03 NOTE — Progress Notes (Signed)
[facility] Progress Note   Subjective:   -  no new c/o's-  planning for HD today-  said she sat up for 15 minutes yesterday but had to get back to bed   Objective Vitals:   12/02/20 1534 12/02/20 2032 12/02/20 2352 12/03/20 0245  BP: (!) 122/58 (!) 115/55 (!) 117/51 (!) 144/64  Pulse: 69 66 64 73  Resp:  18 18 18   Temp: (!) 97.5 F (36.4 C) 98.1 F (36.7 C) 98.4 F (36.9 C) 98.2 F (36.8 C)  TempSrc: Oral Oral Oral Oral  SpO2: 96% 98% 99% 97%  Weight:      Height:       Physical Exam General: Chronically ill appearing woman, NAD Heart: RRR; no murmur Lungs: CTA anteiorly Abdomen: soft, non-tender Extremities: 1+ pitting LE edema to knees. R TMA site bandaged. All L toes with dry gangrene. R thigh bandaged. Hip and sacrum not examined Dialysis Access: TDC in R chest, LUE AVF + bruit and sig hematoma  Additional Objective Labs: Basic Metabolic Panel: Recent Labs  Lab 12/01/20 0115 12/02/20 0151 12/03/20 0127  NA 135 135 138  K 4.1 3.8 4.2  CL 102 99 104  CO2 26 27 28   GLUCOSE 166* 116* 106*  BUN 32* 20 38*  CREATININE 4.15* 2.78* 4.38*  CALCIUM 7.7* 7.9* 8.2*  PHOS  --   --  4.1   Liver Function Tests: Recent Labs  Lab 12/03/20 0127  ALBUMIN 1.5*   CBC: Recent Labs  Lab 11/27/20 0202 11/28/20 0233 11/29/20 0102 11/30/20 0156 12/01/20 0115 12/02/20 0151 12/03/20 0127  WBC 16.3*   < > 15.7* 15.4* 13.0* 12.2* 12.4*  NEUTROABS 12.3*  --   --   --   --   --  9.0*  HGB 8.4*   < > 8.2* 7.1* 6.8* 8.5* 9.6*  HCT 27.7*   < > 26.6* 22.7* 21.6* 27.7* 30.8*  MCV 106.9*   < > 106.4* 106.1* 105.4* 101.5* 101.3*  PLT 228   < > 197 148* 140* 140* 155   < > = values in this interval not displayed.   Blood Culture    Component Value Date/Time   SDES BLOOD RIGHT HAND 11/25/2020 2301   SPECREQUEST  11/25/2020 2301    BOTTLES DRAWN AEROBIC ONLY Blood Culture results may not be optimal due to an inadequate volume of blood received in culture  bottles   CULT  11/25/2020 2301    NO GROWTH 5 DAYS Performed at [facility], [place] [address] [place], [place] [address]    REPTSTATUS 12/01/2020 FINAL 11/25/2020 2301   Studies/Results: No results found.  Medications:  sodium thiosulfate infusion for calciphylaxis 25 g (11/29/20 1456)    (feeding supplement) PROSource Plus  30 mL Oral TID BM   amiodarone  200 mg Oral Daily   apixaban  5 mg Oral BID   vitamin C  500 mg Oral BID   aspirin EC  81 mg Oral Daily   atorvastatin  20 mg Oral Daily   Chlorhexidine Gluconate Cloth  6 each Topical Q0600   [START ON 12/10/2020] darbepoetin (ARANESP) injection - DIALYSIS  200 mcg Intravenous Q Sat-HD   famotidine  20 mg Oral Daily   feeding supplement  237 mL Oral BID BM   folic acid  1 mg Oral Daily   insulin aspart  0-6 Units Subcutaneous TID WC   linagliptin  5 mg Oral Daily   multivitamin  1 tablet Oral QHS  sevelamer carbonate  800 mg Oral TID WC    Dialysis Orders: TTS at [facility] 3:45hr, 250/500 (new cannulation protocol AVF), EDW 59.5kg, 3K/2.5Ca, AVF + TDC, no heparin - Venofer 100mg  x 5 ordered - Mircera 196mcg IV q 2 weeks (last 6/14)   Assessment/Plan:  Extensive gangrenous wounds (all toes, R thigh, R hip, entire sacrum, L hip): Off abx. 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   Calciphylaxis wounds: B hip/sacrum. Plan to treat empirically with sodium thiosulfate and discontinue all Ca/Vit D/iron products.  ESRD:   HD per TTS schedule normally but needs to be changed to MWF for rehab- HD today- then Monday to get on MWF  Use TDC while inpatient-  try to do in chair  Hypertension/volume: Hypotensive on admit - home meds on hold. Stable BPs now.  UF as tolerated.  Anemia: Hgb down to 7; not surprising, transfuse as needed- got one unit 6/23. Aranesp 200 weekly. 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  Metabolic bone disease: calciphylaxis-  CorrCa ok, Phos 6.2. D/c Vit D3 and changed binder from  Auryxia -> Renvela. Using 2Ca bath with dialysis as well as sodium thiosulfate  Nutrition:  Alb very low, push protein  CAD  PAD  T2DM  A-fib on Eliquis: Heparin bridge for surgery.  GOC: Needs ongoing dialogue and will see how wounds progress.  For SNF placement   Louis Meckel, MD  12/03/2020, 8:29 AM  [facility]

## 2020-12-03 NOTE — Progress Notes (Signed)
Patient had an incontinent episode of BM. While cleaning the patient, there was a scant amount of bright red blood while wiping the area.   Hemorrhoids??  Patient denies having hemorrhoids in the past.  Cream for the hemorrhoids?   Thanks

## 2020-12-03 NOTE — Plan of Care (Signed)
  Problem: Health Behavior/Discharge Planning: Goal: Ability to manage health-related needs will improve Outcome: Not Progressing   

## 2020-12-04 DIAGNOSIS — Z515 Encounter for palliative care: Secondary | ICD-10-CM

## 2020-12-04 LAB — GLUCOSE, CAPILLARY
Glucose-Capillary: 96 mg/dL (ref 70–99)
Glucose-Capillary: 107 mg/dL — ABNORMAL HIGH (ref 70–99)
Glucose-Capillary: 157 mg/dL — ABNORMAL HIGH (ref 70–99)
Glucose-Capillary: 183 mg/dL — ABNORMAL HIGH (ref 70–99)

## 2020-12-04 MED ORDER — CHLORHEXIDINE GLUCONATE CLOTH 2 % EX PADS
6.0000 | MEDICATED_PAD | Freq: Every day | CUTANEOUS | Status: DC
  Administered 2020-12-04 – 2020-12-13 (×7): 6 via TOPICAL

## 2020-12-04 MED ORDER — HYDROMORPHONE HCL 2 MG PO TABS
2.0000 mg | ORAL_TABLET | ORAL | Status: DC | PRN
  Administered 2020-12-04 – 2020-12-05 (×4): 2 mg via ORAL
  Filled 2020-12-04 (×4): qty 1

## 2020-12-04 MED ORDER — HYDROMORPHONE HCL 1 MG/ML IJ SOLN
1.0000 mg | INTRAMUSCULAR | Status: DC | PRN
  Administered 2020-12-04 – 2020-12-06 (×8): 1 mg via INTRAVENOUS
  Filled 2020-12-04 (×9): qty 1

## 2020-12-04 MED ORDER — LIDOCAINE 5 % EX OINT
1.0000 "application " | TOPICAL_OINTMENT | Freq: Four times a day (QID) | CUTANEOUS | Status: DC
  Administered 2020-12-04 – 2020-12-13 (×26): 1 via TOPICAL
  Filled 2020-12-04 (×6): qty 35.44

## 2020-12-04 NOTE — Progress Notes (Signed)
[facility] Progress Note   Subjective:   HD yest-  removed 2000- tolerated well-  no new issues this AM  Objective Vitals:   12/03/20 1828 12/03/20 1904 12/04/20 0017 12/04/20 0427  BP: (!) 122/59  (!) 104/55 (!) 120/52  Pulse: 74  67 68  Resp: 16  17 19   Temp: 98.9 F (37.2 C) 98.5 F (36.9 C) 98.7 F (37.1 C) 98.3 F (36.8 C)  TempSrc: Oral Oral Oral Oral  SpO2: 97%  93% 98%  Weight: 70.3 kg     Height:       Physical Exam General: Chronically ill appearing woman, NAD Heart: RRR; no murmur Lungs: CTA anteiorly Abdomen: soft, non-tender Extremities: 1+ pitting LE edema to knees. R TMA site bandaged. All L toes with dry gangrene. R thigh bandaged. Hip and sacrum not examined Dialysis Access: TDC in R chest, LUE AVF + bruit and sig hematoma  Additional Objective Labs: Basic Metabolic Panel: Recent Labs  Lab 12/01/20 0115 12/02/20 0151 12/03/20 0127  NA 135 135 138  K 4.1 3.8 4.2  CL 102 99 104  CO2 26 27 28   GLUCOSE 166* 116* 106*  BUN 32* 20 38*  CREATININE 4.15* 2.78* 4.38*  CALCIUM 7.7* 7.9* 8.2*  PHOS  --   --  4.1   Liver Function Tests: Recent Labs  Lab 12/03/20 0127  ALBUMIN 1.5*   CBC: Recent Labs  Lab 11/29/20 0102 11/30/20 0156 12/01/20 0115 12/02/20 0151 12/03/20 0127  WBC 15.7* 15.4* 13.0* 12.2* 12.4*  NEUTROABS  --   --   --   --  9.0*  HGB 8.2* 7.1* 6.8* 8.5* 9.6*  HCT 26.6* 22.7* 21.6* 27.7* 30.8*  MCV 106.4* 106.1* 105.4* 101.5* 101.3*  PLT 197 148* 140* 140* 155   Blood Culture    Component Value Date/Time   SDES BLOOD RIGHT HAND 11/25/2020 2301   SPECREQUEST  11/25/2020 2301    BOTTLES DRAWN AEROBIC ONLY Blood Culture results may not be optimal due to an inadequate volume of blood received in culture bottles   CULT  11/25/2020 2301    NO GROWTH 5 DAYS Performed at [facility], [address], [place], [place] [address]    REPTSTATUS 12/01/2020 FINAL 11/25/2020 2301   Studies/Results: No results  found.  Medications:  sodium thiosulfate infusion for calciphylaxis Stopped (12/03/20 1853)    (feeding supplement) PROSource Plus  30 mL Oral TID BM   amiodarone  200 mg Oral Daily   apixaban  5 mg Oral BID   vitamin C  500 mg Oral BID   aspirin EC  81 mg Oral Daily   atorvastatin  20 mg Oral Daily   Chlorhexidine Gluconate Cloth  6 each Topical Q0600   darbepoetin (ARANESP) injection - DIALYSIS  200 mcg Intravenous Q Sat-HD   famotidine  20 mg Oral Daily   feeding supplement  237 mL Oral BID BM   folic acid  1 mg Oral Daily   insulin aspart  0-6 Units Subcutaneous TID WC   linagliptin  5 mg Oral Daily   multivitamin  1 tablet Oral QHS   sevelamer carbonate  800 mg Oral TID WC    Dialysis Orders: TTS at [facility] 3:45hr, 250/500 (new cannulation protocol AVF), EDW 59.5kg, 3K/2.5Ca, AVF + TDC, no heparin - Venofer 100mg  x 5 ordered - Mircera 111mcg IV q 2 weeks (last 6/14)   Assessment/Plan:  Extensive gangrenous wounds (all toes, R thigh, R hip, entire sacrum,  L hip): Off abx. 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   Calciphylaxis wounds: B hip/sacrum. Plan to treat empirically with sodium thiosulfate and discontinue all Ca/Vit D/iron products.  ESRD:   HD per TTS schedule normally but needs to be changed to MWF for rehab- HD Sat,  then next Monday to get on MWF  Use TDC while inpatient-  try to do in chair ?   Hypertension/volume: Hypotensive on admit - home meds on hold. Stable BPs now.  UF as tolerated.  Anemia: Hgb down to 7; not surprising, transfuse as needed- got one unit 6/23. Aranesp 200 weekly. 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  Metabolic bone disease: calciphylaxis-  CorrCa ok, Phos 6.2. D/c Vit D3 and changed binder to Renvela. Using 2Ca bath with dialysis as well as sodium thiosulfate  Nutrition:  Alb very low, push protein  CAD  PAD  T2DM  A-fib on Eliquis: Heparin bridge for surgery.  GOC: Needs ongoing dialogue and will see how  wounds progress.  For SNF placement   Louis Meckel, MD  12/04/2020, 8:06 AM  [facility]

## 2020-12-04 NOTE — Progress Notes (Signed)
Thigh dressing change done . Cleaned and wet to dry dressing placed. 2 wet 4 x 4 in upper wound and 2 4 x 4 placed in lower wound . Pt tolerated well. Will continue to monitor   Phoebe Sharps, RN

## 2020-12-04 NOTE — Progress Notes (Signed)
PROGRESS NOTE  [facility] CBS:[id number] DOB: [birth year]   PCP: Gala Lewandowsky, MD  Patient is from: Home  DOA: 11/25/2020 LOS: 9  Chief complaints:  Chief Complaint  Patient presents with   Wound Infection     Brief Narrative / Interim history: 70 year old F with PMH of ESRD on HD TTS, PAF on Eliquis, IDDM-2, CAD/CABG, PAD with b/l bypass, unhealing right thigh wound after right femoropopliteal bypass, HTN and HLD presenting with worsening gangrenous changes of all bilateral toes and nonhealing wound of right thigh and bilateral hips.  Vascular surgery consulted.  Patient underwent right TMA and excisional debridement of the right thigh with Dr. Scot Dock on on 6/20.   Plan for discharge to SNF once she tolerates HD in chair which seems to be very difficult given her extensive wound.  Palliative medicine consulted.  Subjective: Seen and examined earlier this morning.  No major events overnight or this morning.  No complaints either.  She denies chest pain, dyspnea or GI symptoms.  She says did dialysis in bed yesterday.  Objective: Vitals:   12/03/20 1904 12/04/20 0017 12/04/20 0427 12/04/20 0850  BP:  (!) 104/55 (!) 120/52 (!) 128/54  Pulse:  67 68 66  Resp:  17 19 18   Temp: 98.5 F (36.9 C) 98.7 F (37.1 C) 98.3 F (36.8 C) 98.1 F (36.7 C)  TempSrc: Oral Oral Oral Oral  SpO2:  93% 98% 100%  Weight:      Height:        Intake/Output Summary (Last 24 hours) at 12/04/2020 1424 Last data filed at 12/03/2020 1809 Gross per 24 hour  Intake --  Output 2000 ml  Net -2000 ml   Filed Weights   12/01/20 0942 12/01/20 1339 12/03/20 1828  Weight: 73.8 kg 71.8 kg 70.3 kg    Examination:  GENERAL: No apparent distress.  Nontoxic. HEENT: MMM.  Vision and hearing grossly intact.  NECK: Supple.  No apparent JVD.  RESP: On RA.  No IWOB.  Fair aeration bilaterally. CVS:  RRR. Heart sounds normal.  ABD/GI/GU: BS+. Abd soft, NTND.  MSK/EXT:  Moves extremities.  1+ pitting  edema in RLE. SKIN: dressing over right foot and right thigh DCI. NEURO: Awake and alert. Oriented appropriately.  No apparent focal neuro deficit. PSYCH: Calm. Normal affect.   Procedures:  6/20-right TMA and excisional debridement of right thigh by Dr. Scot Dock  Microbiology summarized: COVID-19 and influenza PCR nonreactive. Blood cultures NGTD.   Assessment & Plan: Nonhealing ulcer of the right medial thigh at the site of saphenectomy incision Bilateral PAD with bilateral gangrenous changes in toes Extensive pressure skin injury of bilateral buttocks and left hip as below -S/p right TMA and excisional debridement by vascular surgery on 6/20 -VVS recs: daily dressing for right TMA wound and BID WTD dressing for right thigh wound -Outpatient follow-up with Dr. Scot Dock in 2 weeks -Continue statin and aspirin. -Sepsis ruled out.  SIRS resolving.  Blood cultures negative. -Appreciate WOCN recs. -Palliative care consulted  Calciphylaxis thought to be the cause of some of her skin wounds. -On sodium thiosulfate with HD per nephrology -Continue local wound care per WOCN recommendation   ESRD on HD TTS -Needs to tolerate HD in chair for 4 hrs to continue outpatient HD but very difficult with her extensive wounds.  -Consulted palliative care for goals of care discussion  History of CAD/CABG: Stable. -Continue home meds  Paroxysmal atrial fibrillation: Rate controlled. -Continue amiodarone and Eliquis  Diabetes mellitus -Sliding scale insulin, Tradjenta  ABLA superimposed on anemia of renal disease: Surgical blood loss? Recent Labs    11/25/20 1412 11/25/20 1512 11/26/20 0550 11/27/20 0202 11/28/20 0233 11/29/20 0102 11/30/20 0156 12/01/20 0115 12/02/20 0151 12/03/20 0127  HGB 10.3* 9.2* 8.1* 8.4* 7.9* 8.2* 7.1* 6.8* 8.5* 9.6*  -Transfused 1 unit on 6/23 with appropriate response. -Monitor H&H  Thrombocytopenia: Resolved. Recent Labs  Lab 11/28/20 0233  11/29/20 0102 11/30/20 0156 12/01/20 0115 12/02/20 0151 12/03/20 0127  PLT 185 197 148* 140* 140* 155  -Continue monitoring   Moderate malnutrition Body mass index is 27.45 kg/m. Nutrition Problem: Moderate Malnutrition Etiology: chronic illness (PAD, ESRD on HD) Signs/Symptoms: mild fat depletion, moderate fat depletion, mild muscle depletion, moderate muscle depletion Interventions: MVI, Prostat, Magic cup, Glucerna shake  Pressure skin injury: POA Pressure Injury Coccyx Medial Stage 3 -  Full thickness tissue loss. Subcutaneous fat may be visible but bone, tendon or muscle are NOT exposed. 2 cm x 1 cm shallow bed with yellow/white (Active)     Location: Coccyx  Location Orientation: Medial  Staging: Stage 3 -  Full thickness tissue loss. Subcutaneous fat may be visible but bone, tendon or muscle are NOT exposed.  Wound Description (Comments): 2 cm x 1 cm shallow bed with yellow/white  Present on Admission: Yes     Pressure Injury 11/25/20 Thigh Right;Medial Stage 3 -  Full thickness tissue loss. Subcutaneous fat may be visible but bone, tendon or muscle are NOT exposed. (Active)  11/25/20 1945  Location: Thigh  Location Orientation: Right;Medial  Staging: Stage 3 -  Full thickness tissue loss. Subcutaneous fat may be visible but bone, tendon or muscle are NOT exposed.  Wound Description (Comments):   Present on Admission: Yes     Pressure Injury Flank Left Unstageable - Full thickness tissue loss in which the base of the injury is covered by slough (yellow, tan, gray, green or brown) and/or eschar (tan, brown or black) in the wound bed. (Active)     Location: Flank  Location Orientation: Left  Staging: Unstageable - Full thickness tissue loss in which the base of the injury is covered by slough (yellow, tan, gray, green or brown) and/or eschar (tan, brown or black) in the wound bed.  Wound Description (Comments):   Present on Admission: Yes     Pressure Injury 11/28/20  Buttocks Left Unstageable - Full thickness tissue loss in which the base of the injury is covered by slough (yellow, tan, gray, green or brown) and/or eschar (tan, brown or black) in the wound bed. (Active)  11/28/20 0826  Location: Buttocks  Location Orientation: Left  Staging: Unstageable - Full thickness tissue loss in which the base of the injury is covered by slough (yellow, tan, gray, green or brown) and/or eschar (tan, brown or black) in the wound bed.  Wound Description (Comments):   Present on Admission:      Pressure Injury 11/28/20 Buttocks Right Unstageable - Full thickness tissue loss in which the base of the injury is covered by slough (yellow, tan, gray, green or brown) and/or eschar (tan, brown or black) in the wound bed. (Active)  11/28/20 0826  Location: Buttocks  Location Orientation: Right  Staging: Unstageable - Full thickness tissue loss in which the base of the injury is covered by slough (yellow, tan, gray, green or brown) and/or eschar (tan, brown or black) in the wound bed.  Wound Description (Comments):   Present on Admission: Yes     Pressure Injury 11/28/20 Hip Left Unstageable -  Full thickness tissue loss in which the base of the injury is covered by slough (yellow, tan, gray, green or brown) and/or eschar (tan, brown or black) in the wound bed. (Active)  11/28/20 0827  Location: Hip  Location Orientation: Left  Staging: Unstageable - Full thickness tissue loss in which the base of the injury is covered by slough (yellow, tan, gray, green or brown) and/or eschar (tan, brown or black) in the wound bed.  Wound Description (Comments):   Present on Admission: Yes   DVT prophylaxis:   apixaban (ELIQUIS) tablet 5 mg  Code Status: Full code Family Communication: Patient and/or RN. Available if any question.  Level of care: Telemetry Medical Status is: Inpatient  Remains inpatient appropriate because:Unsafe d/c plan and Inpatient level of care appropriate due to  severity of illness  Dispo: The patient is from: Home              Anticipated d/c is to: SNF              Patient currently is not medically stable to d/c.  Needs to tolerate HD in chair for 4 hours to continue outpatient HD.   Difficult to place patient No       Consultants:  Nephrology Vascular surgery Palliative medicine   Sch Meds:  Scheduled Meds:  (feeding supplement) PROSource Plus  30 mL Oral TID BM   amiodarone  200 mg Oral Daily   apixaban  5 mg Oral BID   vitamin C  500 mg Oral BID   aspirin EC  81 mg Oral Daily   atorvastatin  20 mg Oral Daily   Chlorhexidine Gluconate Cloth  6 each Topical Q0600   Chlorhexidine Gluconate Cloth  6 each Topical Q0600   darbepoetin (ARANESP) injection - DIALYSIS  200 mcg Intravenous Q Sat-HD   famotidine  20 mg Oral Daily   feeding supplement  237 mL Oral BID BM   folic acid  1 mg Oral Daily   insulin aspart  0-6 Units Subcutaneous TID WC   lidocaine  1 application Topical Z6X   linagliptin  5 mg Oral Daily   multivitamin  1 tablet Oral QHS   sevelamer carbonate  800 mg Oral TID WC   Continuous Infusions:  sodium thiosulfate infusion for calciphylaxis Stopped (12/03/20 1853)   PRN Meds:.diphenhydrAMINE **AND** acetaminophen, acetaminophen, bisacodyl, diclofenac Sodium, docusate sodium, fluticasone, HYDROmorphone (DILAUDID) injection, HYDROmorphone, montelukast, ondansetron (ZOFRAN) IV  Antimicrobials: Anti-infectives (From admission, onward)    Start     Dose/Rate Route Frequency Ordered Stop   11/29/20 1800  ceFEPIme (MAXIPIME) 2 g in sodium chloride 0.9 % 100 mL IVPB        2 g 200 mL/hr over 30 Minutes Intravenous  Once 11/29/20 0908 11/29/20 2046   11/26/20 1700  ceFEPIme (MAXIPIME) 1 g in sodium chloride 0.9 % 100 mL IVPB  Status:  Discontinued        1 g 200 mL/hr over 30 Minutes Intravenous Every 24 hours 11/25/20 1713 11/29/20 0908   11/26/20 1200  vancomycin (VANCOREADY) IVPB 750 mg/150 mL  Status:   Discontinued        750 mg 150 mL/hr over 60 Minutes Intravenous Every T-Th-Sa (Hemodialysis) 11/25/20 1714 11/29/20 0909   11/26/20 1126  vancomycin (VANCOREADY) 750 MG/150ML IVPB       Note to Pharmacy: Cherylann Banas   : cabinet override      11/26/20 1126 11/26/20 1200   11/26/20 1125  vancomycin (VANCOREADY) 750 MG/150ML  IVPB       Note to Pharmacy: Cherylann Banas   : cabinet override      11/26/20 1125 11/26/20 1159   11/25/20 1700  vancomycin (VANCOREADY) IVPB 1250 mg/250 mL        1,250 mg 166.7 mL/hr over 90 Minutes Intravenous  Once 11/25/20 1645 11/26/20 0053   11/25/20 1645  ceFEPIme (MAXIPIME) 2 g in sodium chloride 0.9 % 100 mL IVPB        2 g 200 mL/hr over 30 Minutes Intravenous  Once 11/25/20 1644 11/25/20 2255   11/25/20 1500  clindamycin (CLEOCIN) IVPB 600 mg        600 mg 100 mL/hr over 30 Minutes Intravenous  Once 11/25/20 1459 11/25/20 1606        I have personally reviewed the following labs and images: CBC: Recent Labs  Lab 11/29/20 0102 11/30/20 0156 12/01/20 0115 12/02/20 0151 12/03/20 0127  WBC 15.7* 15.4* 13.0* 12.2* 12.4*  NEUTROABS  --   --   --   --  9.0*  HGB 8.2* 7.1* 6.8* 8.5* 9.6*  HCT 26.6* 22.7* 21.6* 27.7* 30.8*  MCV 106.4* 106.1* 105.4* 101.5* 101.3*  PLT 197 148* 140* 140* 155   BMP &GFR Recent Labs  Lab 11/29/20 0102 11/30/20 0156 12/01/20 0115 12/02/20 0151 12/03/20 0127  NA 137 136 135 135 138  K 3.3* 3.3* 4.1 3.8 4.2  CL 103 100 102 99 104  CO2 23 26 26 27 28   GLUCOSE 147* 120* 166* 116* 106*  BUN 35* 17 32* 20 38*  CREATININE 4.66* 2.73* 4.15* 2.78* 4.38*  CALCIUM 8.2* 7.6* 7.7* 7.9* 8.2*  MG  --   --   --   --  2.2  PHOS  --   --   --   --  4.1   Estimated Creatinine Clearance: 11.7 mL/min (A) (by C-G formula based on SCr of 4.38 mg/dL (H)). Liver & Pancreas: Recent Labs  Lab 12/03/20 0127  ALBUMIN 1.5*   No results for input(s): LIPASE, AMYLASE in the last 168 hours. No results for input(s): AMMONIA in the  last 168 hours. Diabetic: No results for input(s): HGBA1C in the last 72 hours. Recent Labs  Lab 12/03/20 1206 12/03/20 1902 12/03/20 2134 12/04/20 0635 12/04/20 1329  GLUCAP 180* 120* 154* 96 107*   Cardiac Enzymes: No results for input(s): CKTOTAL, CKMB, CKMBINDEX, TROPONINI in the last 168 hours. No results for input(s): PROBNP in the last 8760 hours. Coagulation Profile: Recent Labs  Lab 11/28/20 0233  INR 1.3*   Thyroid Function Tests: No results for input(s): TSH, T4TOTAL, FREET4, T3FREE, THYROIDAB in the last 72 hours. Lipid Profile: No results for input(s): CHOL, HDL, LDLCALC, TRIG, CHOLHDL, LDLDIRECT in the last 72 hours. Anemia Panel: No results for input(s): VITAMINB12, FOLATE, FERRITIN, TIBC, IRON, RETICCTPCT in the last 72 hours. Urine analysis:    Component Value Date/Time   COLORURINE AMBER (A) 09/30/2020 1035   APPEARANCEUR CLOUDY (A) 09/30/2020 1035   LABSPEC 1.012 09/30/2020 1035   PHURINE 8.0 09/30/2020 1035   GLUCOSEU NEGATIVE 09/30/2020 1035   HGBUR NEGATIVE 09/30/2020 1035   BILIRUBINUR NEGATIVE 09/30/2020 1035   KETONESUR NEGATIVE 09/30/2020 1035   PROTEINUR 30 (A) 09/30/2020 1035   NITRITE NEGATIVE 09/30/2020 1035   LEUKOCYTESUR LARGE (A) 09/30/2020 1035   Sepsis Labs: Invalid input(s): PROCALCITONIN, Anna  Microbiology: Recent Results (from the past 240 hour(s))  Blood Culture (routine x 2)     Status: None   Collection Time:  11/25/20  2:06 PM   Specimen: BLOOD  Result Value Ref Range Status   Specimen Description BLOOD RIGHT ANTECUBITAL  Final   Special Requests   Final    BOTTLES DRAWN AEROBIC AND ANAEROBIC Blood Culture adequate volume   Culture   Final    NO GROWTH 5 DAYS Performed at [facility], [address] [place], [place] [address]    Report Status 11/30/2020 FINAL  Final  Resp Panel by RT-PCR (Flu A&B, Covid) Nasopharyngeal Swab     Status: None   Collection Time: 11/25/20  2:06 PM   Specimen:  Nasopharyngeal Swab; Nasopharyngeal(NP) swabs in vial transport medium  Result Value Ref Range Status   SARS Coronavirus 2 by RT PCR NEGATIVE NEGATIVE Final    Comment: (NOTE) SARS-CoV-2 target nucleic acids are NOT DETECTED.  The SARS-CoV-2 RNA is generally detectable in upper respiratory specimens during the acute phase of infection. The lowest concentration of SARS-CoV-2 viral copies this assay can detect is 138 copies/mL. A negative result does not preclude SARS-Cov-2 infection and should not be used as the sole basis for treatment or other patient management decisions. A negative result may occur with  improper specimen collection/handling, submission of specimen other than nasopharyngeal swab, presence of viral mutation(s) within the areas targeted by this assay, and inadequate number of viral copies(<138 copies/mL). A negative result must be combined with clinical observations, patient history, and epidemiological information. The expected result is Negative.  Fact Sheet for Patients:  EntrepreneurPulse.com.au  Fact Sheet for Healthcare Providers:  IncredibleEmployment.be  This test is no t yet approved or cleared by the [place] FDA and  has been authorized for detection and/or diagnosis of SARS-CoV-2 by FDA under an Emergency Use Authorization (EUA). This EUA will remain  in effect (meaning this test can be used) for the duration of the COVID-19 declaration under Section 564(b)(1) of the Act, 21 U.S.C.section 360bbb-3(b)(1), unless the authorization is terminated  or revoked sooner.       Influenza A by PCR NEGATIVE NEGATIVE Final   Influenza B by PCR NEGATIVE NEGATIVE Final    Comment: (NOTE) The Xpert Xpress SARS-CoV-2/FLU/RSV plus assay is intended as an aid in the diagnosis of influenza from Nasopharyngeal swab specimens and should not be used as a sole basis for treatment. Nasal washings and aspirates are unacceptable for  Xpert Xpress SARS-CoV-2/FLU/RSV testing.  Fact Sheet for Patients: EntrepreneurPulse.com.au  Fact Sheet for Healthcare Providers: IncredibleEmployment.be  This test is not yet approved or cleared by the [place] FDA and has been authorized for detection and/or diagnosis of SARS-CoV-2 by FDA under an Emergency Use Authorization (EUA). This EUA will remain in effect (meaning this test can be used) for the duration of the COVID-19 declaration under Section 564(b)(1) of the Act, 21 U.S.C. section 360bbb-3(b)(1), unless the authorization is terminated or revoked.  Performed at [facility], [place] [address], [place], [place] [address]   Blood Culture (routine x 2)     Status: None   Collection Time: 11/25/20 11:01 PM   Specimen: BLOOD RIGHT HAND  Result Value Ref Range Status   Specimen Description BLOOD RIGHT HAND  Final   Special Requests   Final    BOTTLES DRAWN AEROBIC ONLY Blood Culture results may not be optimal due to an inadequate volume of blood received in culture bottles   Culture   Final    NO GROWTH 5 DAYS Performed at [facility], [place],  [place] [address]    Report Status 12/01/2020 FINAL  Final    Radiology Studies: No results found.    Rik Wadel T. [place]  If 7PM-7AM, please contact night-coverage [email or web address] 12/04/2020, 2:24 PM

## 2020-12-04 NOTE — Consult Note (Signed)
Consultation Note Date: 12/04/2020   Patient Name: Kathryn Hancock  DOB: 1951/05/07  MRN: [id number]  Age / Sex: 70 y.o., female  PCP: Gala Lewandowsky, MD Referring Physician: Mercy Riding, MD  Reason for Consultation: Establishing goals of care, Non pain symptom management, Pain control, and Psychosocial/spiritual support.   Patient's daughter, Kathryn Hancock, was at bedside for consultation. Patient refers to her daughter as "Jacqlyn Larsen."   HPI/Patient Profile: 70 y.o. female  with past medical history of significant PAD w/ status post left and right leg bypasses, bilateral toe ischemia, unhealed wound to right thigh post right femoropopliteal bypass, ESRD requiring HD since January 2022, PAF, IDDM, HTN, CAP status post CABG, and HLD who was admitted on 11/25/2020 with severe wound pain and worsening gangrene on bilateral toes, right thigh wounds, and bilateral ulcers on hips. Patient has had 4 previous admissions in the last 6 months all related to PAD and gangrenous ischemia. Patient underwent right TMA and excisional debridement of right thigh on 6/20. Palliative medicine was consulted for symptom management and to aid in complex medical decision making moving forward.   Clinical Assessment and Goals of Care: I have reviewed medical records including EPIC notes, labs and imaging, received report from RN, assessed the patient and then met at the bedside along with daughter, Kathryn Hancock, to discuss diagnosis prognosis, [place], EOL wishes, disposition and options.  I introduced Palliative Medicine as specialized medical care for people living with serious illness. It focuses on providing relief from the symptoms and stress of a serious illness. The goal is to improve quality of life for both the patient and the family.  We discussed a brief life review of the patient and then focused on their current illness. The natural disease  trajectory and expectations at EOL were discussed. Ms. Cotterill and her daughter, Kathryn Hancock, were both quick to engage in story telling about family and Ms. Lindfors's childhood. A common theme to many of the stories was Love. It is clear that Kathryn Hancock and her mother have a very special bond. Ms. Blue encourages this bond by expressing "We are all we've got." Ms. Yokoyama [place] farm and she would eat [employer]iscuits and her way to school. 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 Ms. Bennick lives at home with Kathryn Hancock and stays in touch wither her grandsons. Kathryn Hancock and Ms. Hagarty express there has been a lot of sadness in their lives recently as they have lost multiple family members in the past year and a half. When asked how they have been able to handle these tragedies, MS. Strawderman states " you have to take it one day at a time. You get up. You do it, and you trust in God." Palliative expressed empathy and compassion toward their circumstances. When asked about what she knows about her diagnosis, Ms. Spaugh tearfully states "I'll be on HD until I die like my mom & dad." Both Katherine and Ms. Belcourt were tearful throughout conversation. We then  went on to address her wounds and the healing process. We explained that she will likely have more wounds to occur throughout her body with difficulty healing, which is known as the process called calciphylaxis.   I attempted to elicit values and goals of care important to the patient. Ms. Waguespack states that if she doesn't have much time left, she desires to 1) Not be in pain, 2) Get her funeral arranged, and 3) She wishes to pass away at home. Ideally, she wishes she would be able to get back to how she was prior to all of these medical conditions began to worsen her health. She wishes to be able to get up and walk and wants some functionality back.   She expresses that it is very important to her to be able to make her house  payment.  The difference between aggressive medical intervention and comfort care was considered in light of the patient's goals of care. Kathryn Hancock (daughter) made it clear during the conversation that she wants the "bandaid to be ripped off" and for Palliative to tell her what is truly going on with her mother. We expressed her prognosis both with and without hemodialysis. We discussed the difficulties of continuing hemodialysis, as well as the prognosis being only a few weeks if she were to pursue comfort measures. All questions were answered regarding Ms. Schlotzhauer's current status and diagnosis.   Advanced directives, concepts specific to code status, artifical feeding and hydration, and rehospitalization were considered and discussed. At this time, Ms. Trott would like to think about everything that was presented during the discussion. Two MOST forms were left for Ms. Mallie Mussel and Kathryn Hancock to look over in the hopes more definitive decisions will be made over follow-up visits. Ms. Muriel states that if there is "any chance in hell" that she could survive, she would like resuscitation. She reiterates she has "not given up yet."   We gently explained that if she were to be in a position where she was not breathing and lost her pulse the odds of her surviving and making a functional recovery would be next to zero.  Hospice and Palliative Care services outpatient were explained and offered.  Discussed the importance of continued conversation with family and the medical providers regarding overall plan of care and treatment options, ensuring decisions are within the context of the patient's values and GOCs. Chaplaincy support was brought up and Ms. Mclaurin is open to a chaplain coming for support. She identifies that she was raised [facility].   Questions and concerns were addressed.  Hard Choices booklet left for review. The family was encouraged to call with questions or concerns.  PMT will continue to support  holistically.    Primary Decision Maker:  PATIENT Patient identifies Daughter, Kathryn Hancock, as her decision maker if she becomes unable.    SUMMARY OF RECOMMENDATIONS    Code Status/Advance Care Planning: FULL CODE  Full Scope of Treatment MOST form was left in patient room to be reviewed and ideally filled out.   PMT will follow up regarding MOST form and AD.  Symptom Management:  Continue with pain management as needed.  Changed oral Oxycodone to oral Dilaudid in addition to IV dilaudid  START topical lidocaine - apply to wound sites for additional pain relief  Continued follow-up by PMT for ongoing symptom management and discussion of GOC and medical decision making   Additional Recommendations (Limitations, Scope, Preferences): Full Scope Treatment SNF with Palliative to follow on DC.  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  Discharge Planning: To Be Determined      Primary Diagnoses: Present on Admission:  Pressure injury of skin  Anemia of chronic disease  Coronary artery disease involving native coronary artery of native heart without angina pectoris  DM (diabetes mellitus), type 2 with renal complications (HCC)  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 The following aspects are pertinent.  Past Medical History:  Diagnosis Date   AAA (abdominal aortic aneurysm) ([place]) 06/2019   Multiple small pseudoaneurysmal projections  of the dominant aorta.  Distal abdominal aortic aneurysm 4.5 x 4.7 cm.  Greatest AP dimension of the infrarenal aorta is 4.9.  No evidence of thoracic aortic aneurysm or dissection.  Brief segment of proximal IMA occlusion.   Anemia    Coronary artery disease involving native coronary artery of native heart without angina pectoris 06/01/2020   Card[facility] at [facility] 06/01/2020-preop for atrial myxoma resection -> severe proximal RCA and PDA -> had single-vessel CABG with SVG-PDA along with myxoma resection.   Depression    DM (diabetes mellitus), type 2 with renal complications ([place]) 26/37/8588   Dry gangrene ([place]) -> right foot-toes 05/27/2020   ESRD (end stage renal disease) on dialysis ([place]) 06/2020   Progression of CKDIIIb to ESRD initially related to thromboembolic event from left atrial myxoma; complicated by perioperative hypotension-; now 1 on HD TU/TH/SAT @ [facility]   GERD (gastroesophageal reflux disease)    Heavy smoker (more than 20 cigarettes per day)    ~ 2 ppd; since age 38 (21 pk yr) = has cut down to one half PPD.>   Hyperlipidemia associated with type 2 diabetes mellitus ([place]) 08/05/2020   Hypertension    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 = Status post resection December 23,2021 (done at [facility] because of no bed availability at [facility]   Microscopic hematuria    PAF (paroxysmal atrial fibrillation) ([place]) 07/01/2020   Initially noted postoperatively-left atrial myxoma resection and CABG x1.  Now on amiodarone and apixaban..   Peripheral vascular disease ([place])    Bilateral SFA & ATA occlusion.  2 V runoffvia Peroneal A & PTA. --> s/p L Fem-Pop (AK pop A) bypass (PFTE) 09/12/2020 - pending R Fem-AKPop bypass   Plantar wart of right foot    PONV (postoperative nausea and vomiting)    Social History   Socioeconomic History   Marital status: Widowed     Spouse name: Not on file   Number of children: Not on file   Years of education: Not on file   Highest education level: Not on file  Occupational History   Not on file  Tobacco Use   Smoking status: Every Day    Packs/day: 0.50    Years: 50.00    Pack years: 25.00    Types: Cigarettes   Smokeless tobacco: Never  Vaping Use   Vaping Use: Never used  Substance and  Sexual Activity   Alcohol use: Not Currently   Drug use: Never   Sexual activity: Not Currently  Other Topics Concern   Not on file  Social History Narrative   She is a mother of 2-but her son died in 10-10-19 of a drug overdose.   Denies alcohol use.      Patient is widowed and lives with her daughter and several grandchildren locally in [place].     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     At baseline, she lives a sedentary lifestyle and does not exercise or get out of the house much at all.  She is limited by bilateral calf claudication.     She also experiences some exertional shortness of breath which is chronic and stable.       She smokes between 1 and 2 packs of cigarettes daily and has been smoking since she was age 29.     Social Determinants of Health   Financial Resource Strain: Not on file  Food Insecurity: Not on file  Transportation Needs: Not on file  Physical Activity: Not on file  Stress: Not on file  Social Connections: Not on file   Family History  Problem Relation Age of Onset   Diabetes Mother    Diabetes Father    Heart attack Father 28   CAD Father    Hyperlipidemia Father    Hypertension Father     Allergies  Allergen Reactions   Penicillins Anaphylaxis    Anaphylaxis as a child    Vital Signs: BP (!) 128/54 (BP Location: Right Arm)   Pulse 66   Temp 98.1 F (36.7 C) (Oral)   Resp 18   Ht 5' 3"  (1.6 m)   Wt 70.3 kg   SpO2 100%   BMI 27.45 kg/m  Pain Scale: 0-10 POSS *See Group Information*:  S-Acceptable,Sleep, easy to arouse Pain Score: 2    SpO2: SpO2: 100 % O2 Device:SpO2: 100 % O2 Flow Rate: .O2 Flow Rate (L/min): 0 L/min    Palliative Assessment/Data: 30% PPS      Time In: 12:00 Time Out: 1:00 Time Total: 60 mins   Visit consisted of counseling and education dealing with the complex and emotionally intense issues surrounding the need for palliative care and symptom management in the setting of serious and potentially life-threatening illness. Greater than 50%  of this time was spent counseling and coordinating care related to the above assessment and plan.  Signed by: [name], PA-C Palliative Medicine Demetrios Isaacs, PA-S2  Please contact Palliative Medicine Team phone at [phone] for questions and concerns.  For individual provider: See Shea Evans

## 2020-12-05 DIAGNOSIS — Z515 Encounter for palliative care: Secondary | ICD-10-CM

## 2020-12-05 LAB — CBC
HCT: 29.1 % — ABNORMAL LOW (ref 36.0–46.0)
Hemoglobin: 9 g/dL — ABNORMAL LOW (ref 12.0–15.0)
MCH: 31.5 pg (ref 26.0–34.0)
MCHC: 30.9 g/dL (ref 30.0–36.0)
MCV: 101.7 fL — ABNORMAL HIGH (ref 80.0–100.0)
Platelets: 169 10*3/uL (ref 150–400)
RBC: 2.86 MIL/uL — ABNORMAL LOW (ref 3.87–5.11)
RDW: 20.5 % — ABNORMAL HIGH (ref 11.5–15.5)
WBC: 11.3 10*3/uL — ABNORMAL HIGH (ref 4.0–10.5)
nRBC: 0 % (ref 0.0–0.2)

## 2020-12-05 LAB — RENAL FUNCTION PANEL
Albumin: 1.7 g/dL — ABNORMAL LOW (ref 3.5–5.0)
Anion gap: 9 (ref 5–15)
BUN: 33 mg/dL — ABNORMAL HIGH (ref 8–23)
CO2: 27 mmol/L (ref 22–32)
Calcium: 8.3 mg/dL — ABNORMAL LOW (ref 8.9–10.3)
Chloride: 99 mmol/L (ref 98–111)
Creatinine, Ser: 3.9 mg/dL — ABNORMAL HIGH (ref 0.44–1.00)
GFR, Estimated: 12 mL/min — ABNORMAL LOW (ref >60)
Glucose, Bld: 133 mg/dL — ABNORMAL HIGH (ref 70–99)
Phosphorus: 4.1 mg/dL (ref 2.5–4.6)
Potassium: 4.7 mmol/L (ref 3.5–5.1)
Sodium: 135 mmol/L (ref 135–145)

## 2020-12-05 LAB — GLUCOSE, CAPILLARY
Glucose-Capillary: 106 mg/dL — ABNORMAL HIGH (ref 70–99)
Glucose-Capillary: 285 mg/dL — ABNORMAL HIGH (ref 70–99)
Glucose-Capillary: 130 mg/dL — ABNORMAL HIGH (ref 70–99)

## 2020-12-05 LAB — MAGNESIUM: Magnesium: 2.4 mg/dL (ref 1.7–2.4)

## 2020-12-05 MED ORDER — HYDROMORPHONE HCL 2 MG PO TABS
4.0000 mg | ORAL_TABLET | ORAL | Status: DC | PRN
  Administered 2020-12-05 – 2020-12-11 (×18): 4 mg via ORAL
  Filled 2020-12-05 (×21): qty 2

## 2020-12-05 MED ORDER — HEPARIN SODIUM (PORCINE) 1000 UNIT/ML IJ SOLN
INTRAMUSCULAR | Status: AC
  Filled 2020-12-05: qty 4

## 2020-12-05 NOTE — Progress Notes (Signed)
Physical Therapy Treatment Patient Details Name: Kathryn Hancock MRN: [id number] DOB: 11-Sep-1950 Today's Date: 12/05/2020    History of Present Illness pt is a 70 y/o female presenting 6/17 with worsening of gangrene-like changes on her bil toes, non-healing wounds right thigh and non healing ulcers of bil hips.  Pt s/p righ TMA and excisional debridement of right thight 6/20.  PMHx:  CAD, DM, ESRD, HTN, PAF, AAA.    PT Comments    Pt received in chair, agreeable to therapy session and with good participation and fair tolerance for transfer training. Pt limited due to pain and dizziness after having sat in chair >4 hours (BP stable seated but UTA standing BP as pt quick to fatigue) but needing decreased physical assist this date compared with previous session. Plan to progress gait distance next session with RW if tolerated. Pt continues to benefit from PT services to progress toward functional mobility goals. Continue to recommend HHPT.   Follow Up Recommendations  Home health PT;Supervision/Assistance - 24 hour;Other (comment) (pt wishes to attempt home with 24 hour assist from her daughter at a w/c level if needed, but wants to walk short distance.)     Equipment Recommendations  None recommended by PT;Other (comment) (TBA)    Recommendations for Other Services       Precautions / Restrictions Precautions Precautions: Fall Restrictions Weight Bearing Restrictions: Yes RLE Weight Bearing: Partial weight bearing Other Position/Activity Restrictions: PWB through R heel.    Mobility  Bed Mobility Overal bed mobility: Needs Assistance Bed Mobility: Rolling;Sit to Sidelying Rolling: Min guard       Sit to sidelying: Min assist General bed mobility comments: cues for reverse log roll to reduce shearing on buttocks    Transfers Overall transfer level: Needs assistance Equipment used: Rolling walker (2 wheeled) Transfers: Sit to/from [facility] Sit to Stand: +2  physical assistance;Min assist;+2 safety/equipment Stand pivot transfers: Min assist;+2 safety/equipment       General transfer comment: chair>EOB  Ambulation/Gait        General Gait Details: pt defers due to pain and wanting to speak to Chaplain who was waiting in room for her       Balance Overall balance assessment: Needs assistance Sitting-balance support: Feet supported;Feet unsupported;Single extremity supported Sitting balance-Leahy Scale: Fair     Standing balance support: Bilateral upper extremity supported Standing balance-Leahy Scale: Poor Standing balance comment: minA for static/dynamic tasks with RW                            Cognition Arousal/Alertness: Awake/alert Behavior During Therapy: WFL for tasks assessed/performed;Anxious Overall Cognitive Status: Within Functional Limits for tasks assessed                                 General Comments: pt tearful due to pain, eager to reposition      Exercises      General Comments General comments (skin integrity, edema, etc.): BP 143/64 (88) seated EOB (c/o dizziness); HR 86 bpm with exertion; anxious to go home      Pertinent Vitals/Pain Pain Assessment: Faces Faces Pain Scale: Hurts whole lot Pain Location: R foot/amp site and tailbone/sites of B buttock wounds Pain Descriptors / Indicators: Grimacing;Moaning;Throbbing;Aching Pain Intervention(s): Limited activity within patient's tolerance;Monitored during session;Premedicated before session;Repositioned (heels floated)    Home Living  Prior Function            PT Goals (current goals can now be found in the care plan section) Acute Rehab PT Goals Patient Stated Goal: want to get home. walk if I can, w/c if I need to. PT Goal Formulation: With patient Time For Goal Achievement: 12/14/20 Potential to Achieve Goals: Fair Progress towards PT goals: Progressing toward goals     Frequency    Min 3X/week      PT Plan Current plan remains appropriate    Co-evaluation              AM-PAC PT "6 Clicks" Mobility   Outcome Measure  Help needed turning from your back to your side while in a flat bed without using bedrails?: A Little Help needed moving from lying on your back to sitting on the side of a flat bed without using bedrails?: A Lot Help needed moving to and from a bed to a chair (including a wheelchair)?: A Little Help needed standing up from a chair using your arms (e.g., wheelchair or bedside chair)?: A Little Help needed to walk in hospital room?: A Lot Help needed climbing 3-5 steps with a railing? : Total 6 Click Score: 14    End of Session Equipment Utilized During Treatment: Gait belt Activity Tolerance: Patient limited by pain;Patient limited by fatigue Patient left: with call bell/phone within reach;in bed;with bed alarm set;Other (comment) (chaplain Dorian Pod in room to speak with pt, heels floated) Nurse Communication: Mobility status PT Visit Diagnosis: Other abnormalities of gait and mobility (R26.89);Muscle weakness (generalized) (M62.81);Difficulty in walking, not elsewhere classified (R26.2);Pain Pain - part of body: Hip;Leg;Ankle and joints of foot (multiple painful wounds and acute pain R amputation site; tailbone pain)     Time: 1448-1856 PT Time Calculation (min) (ACUTE ONLY): 14 min  Charges:  $Therapeutic Activity: 8-22 mins                     Farhana Fellows P., PTA Acute Rehabilitation Services Pager: [phone] Office: [place] 12/05/2020, 4:22 PM

## 2020-12-05 NOTE — Progress Notes (Addendum)
     VASCULAR SURGERY ASSESSMENT & PLAN:   POD 7 RIGHT TMA: The right TMA is healing well as demonstrated in the photograph below.  Continue daily dressing changes.  The wounds in the medial right thigh are granulating nicely.  Continue meticulous wound care.  I think the biggest issue with her going home is that she will not have an air mattress as she has significant wounds on her sacrum.  SUBJECTIVE:   No specific complaints.   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    LABS:   Lab Results  Component Value Date   WBC 11.3 (H) 12/05/2020   HGB 9.0 (L) 12/05/2020   HCT 29.1 (L) 12/05/2020   MCV 101.7 (H) 12/05/2020   PLT 169 12/05/2020   Lab Results  Component Value Date   CREATININE 3.90 (H) 12/05/2020   CBG (last 3)  Recent Labs    12/04/20 1705 12/04/20 2028 12/05/20 0623  GLUCAP 157* 183* 106*    PROBLEM LIST:    Principal Problem:   Limb ischemia Active Problems:   Left atrial myxoma   Coronary artery disease involving native coronary artery of native heart without angina pectoris   ESRD (end stage renal disease) on dialysis (HCC)   PAF (paroxysmal atrial fibrillation) (HCC) -  CHA2DS2-VASc Score 5 (Age, Female, HTN, DM, Vascular)- On Eliquis   Anemia of chronic disease   Essential hypertension   DM (diabetes mellitus), type 2 with renal complications (HCC)   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   HLD (hyperlipidemia)   Pressure injury of skin   Calciphylaxis   CURRENT MEDS:    (feeding supplement) PROSource Plus  30 mL Oral TID BM   amiodarone  200 mg Oral Daily   apixaban  5 mg Oral BID    vitamin C  500 mg Oral BID   aspirin EC  81 mg Oral Daily   atorvastatin  20 mg Oral Daily   Chlorhexidine Gluconate Cloth  6 each Topical Q0600   Chlorhexidine Gluconate Cloth  6 each Topical Q0600   darbepoetin (ARANESP) injection - DIALYSIS  200 mcg Intravenous Q Sat-HD   famotidine  20 mg Oral Daily   feeding supplement  237 mL Oral BID BM   folic acid  1 mg Oral Daily   insulin aspart  0-6 Units Subcutaneous TID WC   lidocaine  1 application Topical G1W   linagliptin  5 mg Oral Daily   multivitamin  1 tablet Oral QHS   sevelamer carbonate  800 mg Oral TID WC    Deitra Mayo Office: [phone] 12/05/2020  Left foot dressing changed by Dr. Scot Dock I changed the thigh dressing wet to dry packing.  Wound has beefy red appears appears viable.  Roxy Horseman PA-C Pending input regarding right TMA per Dr. Nicole Cella note

## 2020-12-05 NOTE — Progress Notes (Signed)
PROGRESS NOTE  Kathryn Hancock YFR:[id number] DOB: 14-Mar-1951   PCP: Gala Lewandowsky, MD  Patient is from: Home  DOA: 11/25/2020 LOS: 51  Chief complaints:  Chief Complaint  Patient presents with   Wound Infection     Brief Narrative / Interim history: 70 year old F with PMH of ESRD on HD TTS, PAF on Eliquis, IDDM-2, CAD/CABG, PAD with b/l bypass, unhealing right thigh wound after right femoropopliteal bypass, HTN and HLD presenting with worsening gangrenous changes of all bilateral toes and nonhealing wound of right thigh and bilateral hips.  Vascular surgery consulted.  Patient underwent right TMA and excisional debridement of the right thigh with Dr. Scot Dock on on 6/20.   Plan for discharge to SNF once she tolerates HD in chair which seems to be very difficult given her extensive wound.  Palliative medicine consulted and met with patient.  Subjective: Seen and examined earlier this morning.  No major events overnight of this morning.  No complaints.  She denies pain, shortness of breath or GI symptoms.  Objective: Vitals:   12/04/20 1710 12/04/20 2021 12/04/20 2349 12/05/20 0350  BP: (!) 122/51 (!) 121/52 (!) 129/51 (!) 128/54  Pulse: 73 72 74 65  Resp: 18 18 18 18   Temp: 98.5 F (36.9 C) 98.5 F (36.9 C) 98.6 F (37 C) 98.6 F (37 C)  TempSrc: Oral Oral Oral Oral  SpO2: 99% 99% 99% 99%  Weight:      Height:       No intake or output data in the 24 hours ending 12/05/20 1128  Filed Weights   12/01/20 0942 12/01/20 1339 12/03/20 1828  Weight: 73.8 kg 71.8 kg 70.3 kg    Examination:   GENERAL: No apparent distress.  Nontoxic. HEENT: MMM.  Vision and hearing grossly intact.  NECK: Supple.  No apparent JVD.  RESP: 99% on RA.  No IWOB.  Fair aeration bilaterally. CVS:  RRR. Heart sounds normal.  ABD/GI/GU: BS+. Abd soft, NTND.  MSK/EXT:  Moves extremities.  Status post right TMA.  1+ edema on right shin. SKIN: Dressing over right foot and right thigh DCI.  Extensive  sacral and hip wounds. NEURO: Awake and alert. Oriented appropriately.  No apparent focal neuro deficit. PSYCH: Calm. Normal affect.   Procedures:  6/20-right TMA and excisional debridement of right thigh by Dr. Scot Dock  Microbiology summarized: COVID-19 and influenza PCR nonreactive. Blood cultures NGTD.   Assessment & Plan: Nonhealing ulcer of the right medial thigh at the site of saphenectomy incision Bilateral PAD with bilateral gangrenous changes in toes Extensive pressure skin injury of bilateral buttocks and left hip as below -S/p right TMA and excisional debridement by vascular surgery on 6/20 -VVS recs: daily dressing for right TMA wound and BID WTD dressing for right thigh wound -Outpatient follow-up with Dr. Scot Dock in 2 weeks -Continue statin and aspirin. -Sepsis ruled out.  SIRS resolving.  Blood cultures negative. -Appreciate WOCN recs. -Appreciate input by palliative care.  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 -On sodium thiosulfate with HD per nephrology -Continue local wound care per WOCN recommendation   ESRD on HD TTS -Needs to tolerate HD in chair for 4 hrs to continue outpatient HD but very difficult with her extensive wounds.   History of CAD/CABG: Stable. -Continue home meds  Paroxysmal atrial fibrillation: Rate controlled. -Continue amiodarone and Eliquis  Uncontrolled NIDDM-2 with hyperglycemia, ESRD and PAD: Last A1c 11.1% on 4/21.  Only takes Tradjenta at home. No results for  input(s): HGBA1C in the last 72 hours. Recent Labs  Lab 12/04/20 1329 12/04/20 1705 12/04/20 2028 12/05/20 0623 12/05/20 1048  GLUCAP 107* 157* 183* 106* 285*  -Continue current SSI and Tradjenta -Recheck hemoglobin A1c   ABLA superimposed on anemia of renal disease: Surgical blood loss? Recent Labs    11/25/20 1512 11/26/20 0550 11/27/20 0202 11/28/20 0233 11/29/20 0102 11/30/20 0156 12/01/20 0115 12/02/20 0151 12/03/20 0127  12/05/20 0156  HGB 9.2* 8.1* 8.4* 7.9* 8.2* 7.1* 6.8* 8.5* 9.6* 9.0*  -Transfused 1 unit on 6/23 with appropriate response. -Monitor H&H  Thrombocytopenia: Resolved. Recent Labs  Lab 11/29/20 0102 11/30/20 0156 12/01/20 0115 12/02/20 0151 12/03/20 0127 12/05/20 0156  PLT 197 148* 140* 140* 155 169  -Continue monitoring   Moderate malnutrition Body mass index is 27.45 kg/m. Nutrition Problem: Moderate Malnutrition Etiology: chronic illness (PAD, ESRD on HD) Signs/Symptoms: mild fat depletion, moderate fat depletion, mild muscle depletion, moderate muscle depletion Interventions: MVI, Prostat, Magic cup, Glucerna shake  Pressure skin injury: POA Pressure Injury Coccyx Medial Stage 3 -  Full thickness tissue loss. Subcutaneous fat may be visible but bone, tendon or muscle are NOT exposed. 2 cm x 1 cm shallow bed with yellow/white (Active)     Location: Coccyx  Location Orientation: Medial  Staging: Stage 3 -  Full thickness tissue loss. Subcutaneous fat may be visible but bone, tendon or muscle are NOT exposed.  Wound Description (Comments): 2 cm x 1 cm shallow bed with yellow/white  Present on Admission: Yes     Pressure Injury 11/25/20 Thigh Right;Medial Stage 3 -  Full thickness tissue loss. Subcutaneous fat may be visible but bone, tendon or muscle are NOT exposed. (Active)  11/25/20 1945  Location: Thigh  Location Orientation: Right;Medial  Staging: Stage 3 -  Full thickness tissue loss. Subcutaneous fat may be visible but bone, tendon or muscle are NOT exposed.  Wound Description (Comments):   Present on Admission: Yes     Pressure Injury Flank Left Unstageable - Full thickness tissue loss in which the base of the injury is covered by slough (yellow, tan, gray, green or brown) and/or eschar (tan, brown or black) in the wound bed. (Active)     Location: Flank  Location Orientation: Left  Staging: Unstageable - Full thickness tissue loss in which the base of the  injury is covered by slough (yellow, tan, gray, green or brown) and/or eschar (tan, brown or black) in the wound bed.  Wound Description (Comments):   Present on Admission: Yes     Pressure Injury 11/28/20 Buttocks Left Unstageable - Full thickness tissue loss in which the base of the injury is covered by slough (yellow, tan, gray, green or brown) and/or eschar (tan, brown or black) in the wound bed. (Active)  11/28/20 0826  Location: Buttocks  Location Orientation: Left  Staging: Unstageable - Full thickness tissue loss in which the base of the injury is covered by slough (yellow, tan, gray, green or brown) and/or eschar (tan, brown or black) in the wound bed.  Wound Description (Comments):   Present on Admission:      Pressure Injury 11/28/20 Buttocks Right Unstageable - Full thickness tissue loss in which the base of the injury is covered by slough (yellow, tan, gray, green or brown) and/or eschar (tan, brown or black) in the wound bed. (Active)  11/28/20 0826  Location: Buttocks  Location Orientation: Right  Staging: Unstageable - Full thickness tissue loss in which the base of the injury is covered  by slough (yellow, tan, gray, green or brown) and/or eschar (tan, brown or black) in the wound bed.  Wound Description (Comments):   Present on Admission: Yes     Pressure Injury 11/28/20 Hip Left Unstageable - Full thickness tissue loss in which the base of the injury is covered by slough (yellow, tan, gray, green or brown) and/or eschar (tan, brown or black) in the wound bed. (Active)  11/28/20 0827  Location: Hip  Location Orientation: Left  Staging: Unstageable - Full thickness tissue loss in which the base of the injury is covered by slough (yellow, tan, gray, green or brown) and/or eschar (tan, brown or black) in the wound bed.  Wound Description (Comments):   Present on Admission: Yes   DVT prophylaxis:   apixaban (ELIQUIS) tablet 5 mg  Code Status: Full code Family  Communication: Patient and/or RN. Available if any question.  Level of care: Telemetry Medical Status is: Inpatient  Remains inpatient appropriate because:Unsafe d/c plan and Inpatient level of care appropriate due to severity of illness  Dispo: The patient is from: Home              Anticipated d/c is to: SNF              Patient currently is not medically stable to d/c.  Needs to tolerate HD in chair for 4 hours to continue outpatient HD.   Difficult to place patient No       Consultants:  Nephrology Vascular surgery Palliative medicine   Sch Meds:  Scheduled Meds:  (feeding supplement) PROSource Plus  30 mL Oral TID BM   amiodarone  200 mg Oral Daily   apixaban  5 mg Oral BID   vitamin C  500 mg Oral BID   aspirin EC  81 mg Oral Daily   atorvastatin  20 mg Oral Daily   Chlorhexidine Gluconate Cloth  6 each Topical Q0600   Chlorhexidine Gluconate Cloth  6 each Topical Q0600   darbepoetin (ARANESP) injection - DIALYSIS  200 mcg Intravenous Q Sat-HD   famotidine  20 mg Oral Daily   feeding supplement  237 mL Oral BID BM   folic acid  1 mg Oral Daily   insulin aspart  0-6 Units Subcutaneous TID WC   lidocaine  1 application Topical X7D   linagliptin  5 mg Oral Daily   multivitamin  1 tablet Oral QHS   sevelamer carbonate  800 mg Oral TID WC   Continuous Infusions:  sodium thiosulfate infusion for calciphylaxis Stopped (12/03/20 1853)   PRN Meds:.diphenhydrAMINE **AND** acetaminophen, acetaminophen, bisacodyl, diclofenac Sodium, docusate sodium, fluticasone, HYDROmorphone (DILAUDID) injection, HYDROmorphone, montelukast, ondansetron (ZOFRAN) IV  Antimicrobials: Anti-infectives (From admission, onward)    Start     Dose/Rate Route Frequency Ordered Stop   11/29/20 1800  ceFEPIme (MAXIPIME) 2 g in sodium chloride 0.9 % 100 mL IVPB        2 g 200 mL/hr over 30 Minutes Intravenous  Once 11/29/20 0908 11/29/20 2046   11/26/20 1700  ceFEPIme (MAXIPIME) 1 g in sodium  chloride 0.9 % 100 mL IVPB  Status:  Discontinued        1 g 200 mL/hr over 30 Minutes Intravenous Every 24 hours 11/25/20 1713 11/29/20 0908   11/26/20 1200  vancomycin (VANCOREADY) IVPB 750 mg/150 mL  Status:  Discontinued        750 mg 150 mL/hr over 60 Minutes Intravenous Every T-Th-Sa (Hemodialysis) 11/25/20 1714 11/29/20 0909   11/26/20 1126  vancomycin (VANCOREADY) 750 MG/150ML IVPB       Note to Pharmacy: Cherylann Banas   : cabinet override      11/26/20 1126 11/26/20 1200   11/26/20 1125  vancomycin (VANCOREADY) 750 MG/150ML IVPB       Note to Pharmacy: Cherylann Banas   : cabinet override      11/26/20 1125 11/26/20 1159   11/25/20 1700  vancomycin (VANCOREADY) IVPB 1250 mg/250 mL        1,250 mg 166.7 mL/hr over 90 Minutes Intravenous  Once 11/25/20 1645 11/26/20 0053   11/25/20 1645  ceFEPIme (MAXIPIME) 2 g in sodium chloride 0.9 % 100 mL IVPB        2 g 200 mL/hr over 30 Minutes Intravenous  Once 11/25/20 1644 11/25/20 2255   11/25/20 1500  clindamycin (CLEOCIN) IVPB 600 mg        600 mg 100 mL/hr over 30 Minutes Intravenous  Once 11/25/20 1459 11/25/20 1606        I have personally reviewed the following labs and images: CBC: Recent Labs  Lab 11/30/20 0156 12/01/20 0115 12/02/20 0151 12/03/20 0127 12/05/20 0156  WBC 15.4* 13.0* 12.2* 12.4* 11.3*  NEUTROABS  --   --   --  9.0*  --   HGB 7.1* 6.8* 8.5* 9.6* 9.0*  HCT 22.7* 21.6* 27.7* 30.8* 29.1*  MCV 106.1* 105.4* 101.5* 101.3* 101.7*  PLT 148* 140* 140* 155 169   BMP &GFR Recent Labs  Lab 11/30/20 0156 12/01/20 0115 12/02/20 0151 12/03/20 0127 12/05/20 0156  NA 136 135 135 138 135  K 3.3* 4.1 3.8 4.2 4.7  CL 100 102 99 104 99  CO2 26 26 27 28 27   GLUCOSE 120* 166* 116* 106* 133*  BUN 17 32* 20 38* 33*  CREATININE 2.73* 4.15* 2.78* 4.38* 3.90*  CALCIUM 7.6* 7.7* 7.9* 8.2* 8.3*  MG  --   --   --  2.2 2.4  PHOS  --   --   --  4.1 4.1   Estimated Creatinine Clearance: 13.2 mL/min (A) (by C-G formula  based on SCr of 3.9 mg/dL (H)). Liver & Pancreas: Recent Labs  Lab 12/03/20 0127 12/05/20 0156  ALBUMIN 1.5* 1.7*   No results for input(s): LIPASE, AMYLASE in the last 168 hours. No results for input(s): AMMONIA in the last 168 hours. Diabetic: No results for input(s): HGBA1C in the last 72 hours. Recent Labs  Lab 12/04/20 1329 12/04/20 1705 12/04/20 2028 12/05/20 0623 12/05/20 1048  GLUCAP 107* 157* 183* 106* 285*   Cardiac Enzymes: No results for input(s): CKTOTAL, CKMB, CKMBINDEX, TROPONINI in the last 168 hours. No results for input(s): PROBNP in the last 8760 hours. Coagulation Profile: No results for input(s): INR, PROTIME in the last 168 hours.  Thyroid Function Tests: No results for input(s): TSH, T4TOTAL, FREET4, T3FREE, THYROIDAB in the last 72 hours. Lipid Profile: No results for input(s): CHOL, HDL, LDLCALC, TRIG, CHOLHDL, LDLDIRECT in the last 72 hours. Anemia Panel: No results for input(s): VITAMINB12, FOLATE, FERRITIN, TIBC, IRON, RETICCTPCT in the last 72 hours. Urine analysis:    Component Value Date/Time   COLORURINE AMBER (A) 09/30/2020 1035   APPEARANCEUR CLOUDY (A) 09/30/2020 1035   LABSPEC 1.012 09/30/2020 1035   PHURINE 8.0 09/30/2020 1035   GLUCOSEU NEGATIVE 09/30/2020 1035   HGBUR NEGATIVE 09/30/2020 1035   BILIRUBINUR NEGATIVE 09/30/2020 1035   KETONESUR NEGATIVE 09/30/2020 1035   PROTEINUR 30 (A) 09/30/2020 1035   NITRITE NEGATIVE 09/30/2020 1035  LEUKOCYTESUR LARGE (A) 09/30/2020 1035   Sepsis Labs: Invalid input(s): PROCALCITONIN, [place]  Microbiology: Recent Results (from the past 240 hour(s))  Blood Culture (routine x 2)     Status: None   Collection Time: 11/25/20  2:06 PM   Specimen: BLOOD  Result Value Ref Range Status   Specimen Description BLOOD RIGHT ANTECUBITAL  Final   Special Requests   Final    BOTTLES DRAWN AEROBIC AND ANAEROBIC Blood Culture adequate volume   Culture   Final    NO GROWTH 5 DAYS Performed  at [facility], [address], [place], [place] [address]    Report Status 11/30/2020 FINAL  Final  Resp Panel by RT-PCR (Flu A&B, Covid) Nasopharyngeal Swab     Status: None   Collection Time: 11/25/20  2:06 PM   Specimen: Nasopharyngeal Swab; Nasopharyngeal(NP) swabs in vial transport medium  Result Value Ref Range Status   SARS Coronavirus 2 by RT PCR NEGATIVE NEGATIVE Final    Comment: (NOTE) SARS-CoV-2 target nucleic acids are NOT DETECTED.  The SARS-CoV-2 RNA is generally detectable in upper respiratory specimens during the acute phase of infection. The lowest concentration of SARS-CoV-2 viral copies this assay can detect is 138 copies/mL. A negative result does not preclude SARS-Cov-2 infection and should not be used as the sole basis for treatment or other patient management decisions. A negative result may occur with  improper specimen collection/handling, submission of specimen other than nasopharyngeal swab, presence of viral mutation(s) within the areas targeted by this assay, and inadequate number of viral copies(<138 copies/mL). A negative result must be combined with clinical observations, patient history, and epidemiological information. The expected result is Negative.  Fact Sheet for Patients:  EntrepreneurPulse.com.au  Fact Sheet for Healthcare Providers:  IncredibleEmployment.be  This test is no t yet approved or cleared by the [place] FDA and  has been authorized for detection and/or diagnosis of SARS-CoV-2 by FDA under an Emergency Use Authorization (EUA). This EUA will remain  in effect (meaning this test can be used) for the duration of the COVID-19 declaration under Section 564(b)(1) of the Act, 21 U.S.C.section 360bbb-3(b)(1), unless the authorization is terminated  or revoked sooner.       Influenza A by PCR NEGATIVE NEGATIVE Final   Influenza B by PCR NEGATIVE NEGATIVE Final    Comment: (NOTE) The  Xpert Xpress SARS-CoV-2/FLU/RSV plus assay is intended as an aid in the diagnosis of influenza from Nasopharyngeal swab specimens and should not be used as a sole basis for treatment. Nasal washings and aspirates are unacceptable for Xpert Xpress SARS-CoV-2/FLU/RSV testing.  Fact Sheet for Patients: EntrepreneurPulse.com.au  Fact Sheet for Healthcare Providers: IncredibleEmployment.be  This test is not yet approved or cleared by the [place] FDA and has been authorized for detection and/or diagnosis of SARS-CoV-2 by FDA under an Emergency Use Authorization (EUA). This EUA will remain in effect (meaning this test can be used) for the duration of the COVID-19 declaration under Section 564(b)(1) of the Act, 21 U.S.C. section 360bbb-3(b)(1), unless the authorization is terminated or revoked.  Performed at [facility], [place] [address], [place], [place] [address]   Blood Culture (routine x 2)     Status: None   Collection Time: 11/25/20 11:01 PM   Specimen: BLOOD RIGHT HAND  Result Value Ref Range Status   Specimen Description BLOOD RIGHT HAND  Final   Special Requests   Final    BOTTLES DRAWN AEROBIC ONLY Blood Culture results may  not be optimal due to an inadequate volume of blood received in culture bottles   Culture   Final    NO GROWTH 5 DAYS Performed at [facility], [place] [address] [place], [place] [address]    Report Status 12/01/2020 FINAL  Final    Radiology Studies: No results found.    Raheem Kolbe T. Dibble  If 7PM-7AM, please contact night-coverage [email or web address] 12/05/2020, 11:28 AM

## 2020-12-05 NOTE — Progress Notes (Signed)
Daily Progress Note   Patient Name: Kathryn Hancock       Date: 12/05/2020 DOB: [birth year]  Age: 70 y.o. MRN#: [id number] Attending Physician: Mercy Riding, MD Primary Care Physician: Gala Lewandowsky, MD Admit Date: 11/25/2020  Reason for Consultation/Follow-up: Establishing goals of care, Non pain symptom management, Pain control, and Psychosocial/spiritual support   To discuss complex medical decision making related to patient's goals of care.   Chart Reviewed. Patient Examined. RN Notified.   Subjective: Kathryn Hancock, a 70 y.o. female, is awake, sitting up in her chair. She becomes visibly tearful when PMT steps in to visit. She reports she is doing well today and has had the opportunity to discuss everything with her Hancock, Kathryn Hancock (Patient calls her Kathryn Hancock), and her best friend, Kathryn Hancock. She reports Kathryn Hancock was able to give her the clarity she needed regarding her health, and Kathryn Hancock states she is ready to keep fighting and continue with treatment. When asked what Kathryn Hancock told her, [name] paraphrased saying that Kathryn Hancock reminded her she is not a "quitter" and its never known how much time we have on this earth. She states she managed to sit in the chair for over 4 hours today to prepare for outpatient dialysis. She expressed it was uncomfortable and her wounds are hurting, but she is hoping someone will help her readjust in the chair during outpatient dialysis.   When asked about whether or not she is ready to fill out the MOST form we provided her with yesterday, Kathryn Hancock states "Kathryn Hancock" took it home to review it and decide what is best for me. They know what I want." When questioned about what Kathryn Hancock wants, she reiterates that "if there is a chance, I'm doing to take it.  If there is  not a chance then don't mess with it" She also reports that her being on medications might make her not make the best decisions right now, and that is why she wants her Hancock and Tessie Hancock to review the document. PMT expressed their concern that it would be extremely difficult for Kathryn Hancock to have to make the choice to stop care if Kathryn Hancock became unable to speak for herself; however, she reports her Hancock, Kathryn Hancock, has a good support system and she will be okay. [name]'s main  concern about decline and passing away is ensuring Kathryn Hancock are taken care of. She reports Kathryn Hancock will make sure they are okay when the time comes.   Prior to leaving, Ms. Lady expressed to PMT she is looking forward to a chaplain visiting her as she has things she'd like to discuss. We assured her a chaplain would be by and we would make it a priority.   Assessment: Patient is awake, alert, and oriented. Resting in the chair in no acute distress.  Visibly tearful due to emotional conversations. No noticeable increased work of breathing.   Patient Profile/HPI: 70 y.o. female  with past medical history of significant PAD w/ status post left and right leg bypasses, bilateral toe ischemia, unhealed wound to right thigh post right femoropopliteal bypass, ESRD requiring HD since January 2022, PAF, IDDM, HTN, CAP status post CABG, and HLD who was admitted on 11/25/2020 with severe wound pain and worsening gangrene on bilateral toes, right thigh wounds, and bilateral ulcers on hips. Patient has had 4 previous admissions in the last 6 months all related to PAD and gangrenous ischemia. Patient underwent right TMA and excisional debridement of right thigh on 6/20. Palliative medicine was consulted for symptom management and to aid in complex medical decision making moving forward.    Length of Stay: 10   Vital Signs: BP (!) 128/54 (BP Location: Right Arm)   Pulse 65   Temp 98.6 F (37 C) (Oral)   Resp 18    Ht 5\' 3"  (1.6 m)   Wt 70.3 kg   SpO2 99%   BMI 27.45 kg/m  SpO2: SpO2: 99 % O2 Device: O2 Device: Room Air O2 Flow Rate: O2 Flow Rate (L/min): 0 L/min       Palliative Assessment/Data: 30% PPS      Palliative Care Plan    Recommendations/Plan: FULL CODE - Hancock and friend Tessie Hancock are completing the MOST form.   Continue with full scope treatment  Continue with pain management to ensure comfort.  PO dilaudid increased to 4 mg q 4 PRN. Chaplain follow-up requested per patient  Patient requests Outpatient Palliative to follow.  Code Status:  Full code  Prognosis: likely < 6 months with continued hemodialysis due to severe pain from progressive calciphylaxis   Discharge Planning: [facility] for rehab with Palliative care service follow-up  Care plan was discussed with Patient   Thank you for allowing the Palliative Medicine Team to assist in the care of this patient.  Total time spent:  35     Greater than 50%  of this time was spent counseling and coordinating care related to the above assessment and plan.  [name], PA-C Palliative Medicine Demetrios Isaacs, PA-S2  Please contact Palliative MedicineTeam phone at [phone] for questions and concerns between 7 am - 7 pm.   Please see AMION for individual provider pager numbers.

## 2020-12-05 NOTE — Progress Notes (Signed)
Mobility Specialist: Progress Note   12/05/20 1136  Mobility  Activity Transferred:  Bed to chair  Level of Assistance Minimal assist, patient does 75% or more  Assistive Device Front wheel walker  Mobility Out of bed to chair with meals  Mobility Response Tolerated fair  Mobility performed by Mobility specialist  Bed Position Chair  $Mobility charge 1 Mobility   Pre-Mobility: 76 HR Post-Mobility: 98 HR  Pt required minA to sit EOB from supine as well as to stand. Pt c/o 8/10 pain in R foot after sitting EOB but agreeable to transfer to the chair. Pt is sitting in the recliner with call bell and phone in reach with chair alarm on. Pt requested pain medication, RN notified.   [facility] Kaaren Nass Mobility Specialist Mobility Specialist Phone: [phone]

## 2020-12-05 NOTE — Progress Notes (Addendum)
This chaplain responded to PMT referral for Pt. and family spiritual care. The Pt. daughter-Katherine and best friend-Patsy are at the bedside.  The chaplain is appreciative of the RN-Arianne's support as the Pt. is preparing for lunch.   The chaplain introduced herself. The Pt. and family welcomed the chaplain visit with a little story telling.  The chaplain understands the Pt. family and friends are a source of strength along with a trusting relationship with God.  The Pt. accepted the chaplain's invitation for prayer.  The chaplain is planning a F/U visit per the Pt. request.  **1530  This chaplain returned to the Pt. bedside for F/U spiritual care.  The chaplain offered a listening presence and understands the Pt. honors the presence of God's love in her life. The chaplain understands the Pt. faith is giving her the courage to live life one day at a time with the hope of God revealing his plan for the Pt.   The chaplain prayed with the Pt. before the Pt. ordered her dinner. The Pt. is planning on HD on Tuesday.

## 2020-12-05 NOTE — Progress Notes (Signed)
[facility] Progress Note   Subjective:  no c/o  Objective Vitals:   12/04/20 2021 12/04/20 2349 12/05/20 0350 12/05/20 1529  BP: (!) 121/52 (!) 129/51 (!) 128/54 129/65  Pulse: 72 74 65 80  Resp: 18 18 18 20   Temp: 98.5 F (36.9 C) 98.6 F (37 C) 98.6 F (37 C) 98.1 F (36.7 C)  TempSrc: Oral Oral Oral Oral  SpO2: 99% 99% 99% 98%  Weight:      Height:       Physical Exam General: Chronically ill appearing woman, NAD Heart: RRR; no murmur Lungs: CTA anteiorly Abdomen: soft, non-tender Extremities: 1+ pitting LE edema to knees. R TMA site bandaged. All L toes with dry gangrene. R thigh bandaged. Hip and sacrum not examined Dialysis Access: TDC in R chest, LUE AVF + bruit and sig hematoma  Additional Objective Labs: Basic Metabolic Panel: Recent Labs  Lab 12/02/20 0151 12/03/20 0127 12/05/20 0156  NA 135 138 135  K 3.8 4.2 4.7  CL 99 104 99  CO2 27 28 27   GLUCOSE 116* 106* 133*  BUN 20 38* 33*  CREATININE 2.78* 4.38* 3.90*  CALCIUM 7.9* 8.2* 8.3*  PHOS  --  4.1 4.1    Liver Function Tests: Recent Labs  Lab 12/03/20 0127 12/05/20 0156  ALBUMIN 1.5* 1.7*    CBC: Recent Labs  Lab 11/30/20 0156 12/01/20 0115 12/02/20 0151 12/03/20 0127 12/05/20 0156  WBC 15.4* 13.0* 12.2* 12.4* 11.3*  NEUTROABS  --   --   --  9.0*  --   HGB 7.1* 6.8* 8.5* 9.6* 9.0*  HCT 22.7* 21.6* 27.7* 30.8* 29.1*  MCV 106.1* 105.4* 101.5* 101.3* 101.7*  PLT 148* 140* 140* 155 169    Blood Culture    Component Value Date/Time   SDES BLOOD RIGHT HAND 11/25/2020 2301   SPECREQUEST  11/25/2020 2301    BOTTLES DRAWN AEROBIC ONLY Blood Culture results may not be optimal due to an inadequate volume of blood received in culture bottles   CULT  11/25/2020 2301    NO GROWTH 5 DAYS Performed at [facility], [address], [place], [place] [address]    REPTSTATUS 12/01/2020 FINAL 11/25/2020 2301   Studies/Results: No results found.  Medications:  sodium  thiosulfate infusion for calciphylaxis Stopped (12/03/20 1853)    (feeding supplement) PROSource Plus  30 mL Oral TID BM   amiodarone  200 mg Oral Daily   apixaban  5 mg Oral BID   vitamin C  500 mg Oral BID   aspirin EC  81 mg Oral Daily   atorvastatin  20 mg Oral Daily   Chlorhexidine Gluconate Cloth  6 each Topical Q0600   Chlorhexidine Gluconate Cloth  6 each Topical Q0600   darbepoetin (ARANESP) injection - DIALYSIS  200 mcg Intravenous Q Sat-HD   famotidine  20 mg Oral Daily   feeding supplement  237 mL Oral BID BM   folic acid  1 mg Oral Daily   insulin aspart  0-6 Units Subcutaneous TID WC   lidocaine  1 application Topical V9D   linagliptin  5 mg Oral Daily   multivitamin  1 tablet Oral QHS   sevelamer carbonate  800 mg Oral TID WC    Dialysis Orders: Changing to MWF here for SNFP (was TTS [facility]) 3:45hr, 250/500 (new cannulation protocol AVF), EDW 59.5kg, 3K/2.5Ca, AVF + TDC, no heparin - Venofer 100mg  x 5 ordered - Mircera 154mcg IV q 2 weeks (last 6/14)  Assessment/Plan:  Extensive gangrenous wounds (all toes, R thigh, R hip, entire sacrum, L hip): Off abx. S/p R TMA and R thigh debridement 6/20 Calciphylaxis wounds: B hip/sacrum. Plan to treat empirically with sodium thiosulfate and discontinue all Ca/Vit D/iron products.  ESRD:   HD per TTS schedule normally but needs to be changed to MWF for rehab. Next today to get on MWF schedule.   Hypertension/volume: Hypotensive on admit - home meds on hold. Stable BPs now.  UF as tolerated.  Anemia: Hgb down to 7; not surprising, transfuse as needed- got one unit 6/23. Aranesp 200 weekly. 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  Metabolic bone disease: calciphylaxis-  CorrCa ok, Phos 6.2. D/c Vit D3 and changed binder to Renvela. Using 2Ca bath with dialysis as well as sodium thiosulfate  Nutrition:  Alb very low, push protein  CAD  PAD  T2DM  A-fib on Eliquis: Heparin bridge for surgery.  GOC: Needs ongoing  dialogue and will see how wounds progress.  For SNF placement   Sol Blazing, MD  12/05/2020, 4:30 PM  [facility]

## 2020-12-06 DIAGNOSIS — R112 Nausea with vomiting, unspecified: Secondary | ICD-10-CM

## 2020-12-06 DIAGNOSIS — G8929 Other chronic pain: Secondary | ICD-10-CM

## 2020-12-06 DIAGNOSIS — R52 Pain, unspecified: Secondary | ICD-10-CM

## 2020-12-06 LAB — GLUCOSE, CAPILLARY
Glucose-Capillary: 115 mg/dL — ABNORMAL HIGH (ref 70–99)
Glucose-Capillary: 113 mg/dL — ABNORMAL HIGH (ref 70–99)
Glucose-Capillary: 116 mg/dL — ABNORMAL HIGH (ref 70–99)
Glucose-Capillary: 161 mg/dL — ABNORMAL HIGH (ref 70–99)

## 2020-12-06 MED ORDER — METOCLOPRAMIDE HCL 5 MG/ML IJ SOLN
5.0000 mg | Freq: Three times a day (TID) | INTRAMUSCULAR | Status: DC | PRN
  Filled 2020-12-06: qty 2

## 2020-12-06 MED ORDER — HYDROMORPHONE HCL 1 MG/ML IJ SOLN
0.5000 mg | INTRAMUSCULAR | Status: DC | PRN
  Administered 2020-12-06 – 2020-12-13 (×7): 0.5 mg via INTRAVENOUS
  Filled 2020-12-06 (×9): qty 1

## 2020-12-06 MED ORDER — SODIUM THIOSULFATE 250 MG/ML IV SOLN
25.0000 g | INTRAVENOUS | Status: DC
  Administered 2020-12-07 – 2020-12-12 (×2): 25 g via INTRAVENOUS
  Filled 2020-12-06 (×7): qty 100

## 2020-12-06 NOTE — Progress Notes (Addendum)
  Progress Note    12/06/2020 8:03 AM 8 Days Post-Op  Subjective:  no complaints   Vitals:   12/06/20 0542 12/06/20 0755  BP: (!) 103/52 (!) 116/59  Pulse: 70 75  Resp: 16 18  Temp: 98.2 F (36.8 C) 98.1 F (36.7 C)  SpO2: 100% 96%   Physical Exam Lungs:  non labored Incisions:  R thigh incisions with healthy wound bed and granulation tissue; R TMA healing well; lateral aspect of incision with darkening skin edges but intact Extremities:  L toes dry gangrene Neurologic: a&O  CBC    Component Value Date/Time   WBC 11.3 (H) 12/05/2020 0156   RBC 2.86 (L) 12/05/2020 0156   HGB 9.0 (L) 12/05/2020 0156   HGB 9.8 (L) 05/26/2020 1158   HCT 29.1 (L) 12/05/2020 0156   HCT 29.6 (L) 05/26/2020 1158   PLT 169 12/05/2020 0156   PLT 286 05/26/2020 1158   MCV 101.7 (H) 12/05/2020 0156   MCV 100 (H) 05/26/2020 1158   MCH 31.5 12/05/2020 0156   MCHC 30.9 12/05/2020 0156   RDW 20.5 (H) 12/05/2020 0156   RDW 14.1 05/26/2020 1158   LYMPHSABS 1.4 12/03/2020 0127   MONOABS 1.1 (H) 12/03/2020 0127   EOSABS 0.2 12/03/2020 0127   BASOSABS 0.0 12/03/2020 0127    BMET    Component Value Date/Time   NA 135 12/05/2020 0156   NA 139 05/26/2020 1158   K 4.7 12/05/2020 0156   CL 99 12/05/2020 0156   CO2 27 12/05/2020 0156   GLUCOSE 133 (H) 12/05/2020 0156   BUN 33 (H) 12/05/2020 0156   BUN 75 (H) 05/26/2020 1158   CREATININE 3.90 (H) 12/05/2020 0156   CALCIUM 8.3 (L) 12/05/2020 0156   GFRNONAA 12 (L) 12/05/2020 0156   GFRAA 16 (L) 05/26/2020 1158    INR    Component Value Date/Time   INR 1.3 (H) 11/28/2020 0233     Intake/Output Summary (Last 24 hours) at 12/06/2020 0803 Last data filed at 12/05/2020 2351 Gross per 24 hour  Intake --  Output 1500 ml  Net -1500 ml     Assessment/Plan:  70 y.o. female is s/p R TMA 8 Days Post-Op   BLE well warm and well perfused R TMA with lateral skin edge darkening otherwise healing well R thigh wounds with healthy, granulating  wound bed; continue daily wet to dry dressing changes TOC working on SNF placement   Dagoberto Ligas, PA-C [facility] [phone] 12/06/2020 8:03 AM  I have interviewed the patient and examined the patient. I agree with the findings by the PA. Awaiting placement. I will follow her TMA and right thigh wounds as an outpt.  Gae Gallop, MD

## 2020-12-06 NOTE — Progress Notes (Addendum)
[facility] Progress Note   Subjective: up in chair, "my bottom hurts"  Objective Vitals:   12/05/20 2351 12/06/20 0542 12/06/20 0755 12/06/20 1120  BP: (!) 116/56 (!) 103/52 (!) 116/59 (!) 127/59  Pulse: 76 70 75 71  Resp: 14 16 18 18   Temp: 98.4 F (36.9 C) 98.2 F (36.8 C) 98.1 F (36.7 C) 98.2 F (36.8 C)  TempSrc: Oral Oral Oral Oral  SpO2: 98% 100% 96% 98%  Weight:      Height:       Physical Exam General: Chronically ill appearing woman, NAD Heart: RRR; no murmur Lungs: CTA anteiorly Abdomen: soft, non-tender Extremities: 1+ pitting LE edema to knees. R TMA site bandaged. All L toes with dry gangrene. R thigh bandaged.  Dialysis Access: TDC in R chest, LUE AVF + bruit and sig hematoma  Additional Objective Labs: Basic Metabolic Panel: Recent Labs  Lab 12/02/20 0151 12/03/20 0127 12/05/20 0156  NA 135 138 135  K 3.8 4.2 4.7  CL 99 104 99  CO2 27 28 27   GLUCOSE 116* 106* 133*  BUN 20 38* 33*  CREATININE 2.78* 4.38* 3.90*  CALCIUM 7.9* 8.2* 8.3*  PHOS  --  4.1 4.1    Liver Function Tests: Recent Labs  Lab 12/03/20 0127 12/05/20 0156  ALBUMIN 1.5* 1.7*    CBC: Recent Labs  Lab 11/30/20 0156 12/01/20 0115 12/02/20 0151 12/03/20 0127 12/05/20 0156  WBC 15.4* 13.0* 12.2* 12.4* 11.3*  NEUTROABS  --   --   --  9.0*  --   HGB 7.1* 6.8* 8.5* 9.6* 9.0*  HCT 22.7* 21.6* 27.7* 30.8* 29.1*  MCV 106.1* 105.4* 101.5* 101.3* 101.7*  PLT 148* 140* 140* 155 169    Blood Culture    Component Value Date/Time   SDES BLOOD RIGHT HAND 11/25/2020 2301   SPECREQUEST  11/25/2020 2301    BOTTLES DRAWN AEROBIC ONLY Blood Culture results may not be optimal due to an inadequate volume of blood received in culture bottles   CULT  11/25/2020 2301    NO GROWTH 5 DAYS Performed at [facility], [address], [place], [place] [address]    REPTSTATUS 12/01/2020 FINAL 11/25/2020 2301   Studies/Results: No results found.  Medications:   [START ON 12/07/2020] sodium thiosulfate infusion for calciphylaxis      (feeding supplement) PROSource Plus  30 mL Oral TID BM   amiodarone  200 mg Oral Daily   apixaban  5 mg Oral BID   vitamin C  500 mg Oral BID   aspirin EC  81 mg Oral Daily   atorvastatin  20 mg Oral Daily   Chlorhexidine Gluconate Cloth  6 each Topical Q0600   Chlorhexidine Gluconate Cloth  6 each Topical Q0600   darbepoetin (ARANESP) injection - DIALYSIS  200 mcg Intravenous Q Sat-HD   famotidine  20 mg Oral Daily   folic acid  1 mg Oral Daily   insulin aspart  0-6 Units Subcutaneous TID WC   lidocaine  1 application Topical K4Y   linagliptin  5 mg Oral Daily   multivitamin  1 tablet Oral QHS   sevelamer carbonate  800 mg Oral TID WC    Dialysis Orders: Changing to MWF here for SNFP (was TTS [facility]) 3h 84min  250/ 500 (new cannulation protocol AVF) 59.5kg 3K/ 2.5 bath  AVF and TDC  Hep none  - Venofer 100mg  x 5 ordered - Mircera 150mcg IV q 2 weeks (last 6/14)  Assessment/Plan:  SP R TMA - on 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  Calciphylaxis wounds: B hip/sacrum. R thigh wound debrided on 6/20. Plan to treat empirically with sodium thiosulfate and discontinue all Ca/Vit D/iron products.  ESRD:   HD per TTS schedule normally but needs to be changed to MWF for rehab. Use TDC while inpatient. Pt sitting up in chair x 4 hrs today. HD tomorrow.   Hypertension/volume: Hypotensive on admit - home meds on hold. Stable BPs now.  UF as tolerated. Wt's are off by 10kg, mild vol ^ on exam.  Anemia: Hgb down to 7; transfuse as needed- got one unit 6/23. Aranesp 200 weekly. 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  Metabolic bone disease: calciphylaxis-  CorrCa ok, Phos 6.2. D/c Vit D3 and changed binder to Renvela. Using 2Ca bath with dialysis as well as sodium thiosulfate  Nutrition:  Alb very low  CAD  PAD  T2DM  A-fib on Eliquis: Heparin bridge for surgery.  GOC: Needs ongoing dialogue and will see how  wounds progress.  For SNF placement   Sol Blazing, MD  12/06/2020, 2:38 PM  [facility]

## 2020-12-06 NOTE — Progress Notes (Signed)
Palliative Medicine Inpatient Follow Up Note   HPI: 70 y.o. female  with past medical history of significant PAD w/ status post left and right leg bypasses, bilateral toe ischemia, unhealed wound to right thigh post right femoropopliteal bypass, ESRD requiring HD since January 2022, PAF, IDDM, HTN, CAP status post CABG, and HLD who was admitted on 11/25/2020 with severe wound pain and worsening gangrene on bilateral toes, right thigh wounds, and bilateral ulcers on hips. Patient has had 4 previous admissions in the last 6 months all related to PAD and gangrenous ischemia. Patient underwent right TMA and excisional debridement of right thigh on 6/20. Palliative medicine was consulted for symptom management and to aid in complex medical decision making moving forward.   Today's Discussion (* ): When I entered the room the patient was lying in the bed, appears comfortable.  She was asked how her pain was doing today with the increase in pain meds and she states it is doing somewhat better.  Her pain today is mostly in her sacral area ("my bottom").  Her feet and legs do not hurt.  She is actively trying to sit in the chair for nearly 4 hours.  It is easier/more comfortable for her to do this if she is able to sit upright rather than lie flat in the chair as has been required previously and hemodialysis.  She does note that she had some nausea and vomiting this morning.  She had some recurrent nausea this afternoon, but no vomiting.  She is thankful to there adding another nausea medication to help prevent this in the future.  She states her daughter and her best friend are still reviewing/completing the MOST form and they will bring this back when they are done.  She states she is still working toward SNF/rehab transition.  She states "I want to continue to fight and do everything I can to get better".  Prior to leaving she expressed that she would enjoy a visit from the chaplain when they are  available.  *Please note that this is a verbal dictation therefore any spelling or grammatical errors are due to the "[place] [employer]" system interpretation.  Discussed the importance of continued conversation with family and their  medical providers regarding overall plan of care and treatment options, ensuring decisions are within the context of the patients values and GOCs.  Questions and concerns addressed   Objective Assessment: Vital Signs Vitals:   12/06/20 0755 12/06/20 1120  BP: (!) 116/59 (!) 127/59  Pulse: 75 71  Resp: 18 18  Temp: 98.1 F (36.7 C) 98.2 F (36.8 C)  SpO2: 96% 98%    Intake/Output Summary (Last 24 hours) at 12/06/2020 1559 Last data filed at 12/05/2020 2351 Gross per 24 hour  Intake --  Output 1500 ml  Net -1500 ml   Last Weight  Most recent update: 12/03/2020  6:46 PM    Weight  70.3 kg (154 lb 15.7 oz)            Gen:  NAD HEENT: moist mucous membranes CV: Regular rate and rhythm, no murmurs rubs or gallops PULM: clear to auscultation bilaterally. No wheezes/rales/rhonchi ABD: soft/nontender/nondistended/normal bowel sounds EXT: No edema. Noted left foot wound Neuro: Alert and oriented x3  SUMMARY OF RECOMMENDATIONS   Continue full scope of treatment Monitor for worsening pain or nausea Keep current pain regimen as this seems to be improving her symptoms Chaplain follow-up requested Requesting continued plan for outpatient Palliative and SNF/Rehab Complete and  enter MOST form when family able to complete   Time In: 2:45 Time Out: 3:20 Time Spent: 35 min Greater than 50% of the time was spent in counseling and coordination of care ______________________________________________________________________________________ Walden Field, NP [facility] Team Team Cell Phone: [phone] Please utilize secure chat with additional questions, if there is no response within 30 minutes please call the above phone  number  Palliative Medicine Team providers are available by phone from 7am to 7pm daily and can be reached through the team cell phone.  Should this patient require assistance outside of these hours, please call the patient's attending physician.

## 2020-12-06 NOTE — Progress Notes (Signed)
PROGRESS NOTE  Kathryn Hancock EKC:[id number] DOB: August 31, 1950   PCP: Gala Lewandowsky, MD  Patient is from: Home  DOA: 11/25/2020 LOS: 69  Chief complaints:  Chief Complaint  Patient presents with   Wound Infection     Brief Narrative / Interim history: 70 year old F with PMH of ESRD on HD TTS, PAF on Eliquis, IDDM-2, CAD/CABG, PAD with b/l bypass, unhealing right thigh wound after right femoropopliteal bypass, HTN and HLD presenting with worsening gangrenous changes of all bilateral toes and nonhealing wound of right thigh and bilateral hips.  Vascular surgery consulted.  Patient underwent right TMA and excisional debridement of the right thigh with Dr. Scot Dock on on 6/20.   Plan for discharge to SNF once she tolerates HD in chair which seems to be very difficult given her extensive wound.  Palliative medicine consulted and met with patient.  Subjective: Seen and examined earlier this morning.  Feels nauseous at this morning.  She started throwing up while talking to me.  Emesis was nonbloody, just food contents.  She denies abdominal pain.  Reports having bowel movements.  Denies chest pain or dyspnea.  She tells me that she sat in chair for 4 hours but had her dialysis in bed last night.  Objective: Vitals:   12/05/20 2351 12/06/20 0542 12/06/20 0755 12/06/20 1120  BP: (!) 116/56 (!) 103/52 (!) 116/59 (!) 127/59  Pulse: 76 70 75 71  Resp: 14 16 18 18   Temp: 98.4 F (36.9 C) 98.2 F (36.8 C) 98.1 F (36.7 C) 98.2 F (36.8 C)  TempSrc: Oral Oral Oral Oral  SpO2: 98% 100% 96% 98%  Weight:      Height:        Intake/Output Summary (Last 24 hours) at 12/06/2020 1443 Last data filed at 12/05/2020 2351 Gross per 24 hour  Intake --  Output 1500 ml  Net -1500 ml    Filed Weights   12/01/20 0942 12/01/20 1339 12/03/20 1828  Weight: 73.8 kg 71.8 kg 70.3 kg    Examination:  GENERAL: No apparent distress.  Nontoxic. HEENT: MMM.  Vision and hearing grossly intact.  NECK:  Supple.  No apparent JVD.  RESP: On RA.  No IWOB.  Fair aeration bilaterally. CVS:  RRR. Heart sounds normal.  ABD/GI/GU: BS+. Abd soft, NTND.  MSK/EXT:  Moves extremities.  S/p right TMA.  1+ edema on the right shin. SKIN: Dressing over right foot and right thigh DCI.  Extensive sacral and hip wounds. NEURO: Awake and alert. Oriented appropriately.  No apparent focal neuro deficit. PSYCH: Calm. Normal affect.   Procedures:  6/20-right TMA and excisional debridement of right thigh by Dr. Scot Dock  Microbiology summarized: COVID-19 and influenza PCR nonreactive. Blood cultures NGTD.   Assessment & Plan: Nonhealing ulcer of the right medial thigh at the site of saphenectomy incision Bilateral PAD with bilateral gangrenous changes in toes Extensive pressure skin injury of bilateral buttocks and left hip as below -S/p right TMA and excisional debridement by vascular surgery on 6/20 -VVS recs: daily dressing for right TMA wound and BID WTD dressing for right thigh wound -Outpatient follow-up with Dr. Scot Dock in 2 weeks -Continue statin and aspirin. -Sepsis ruled out.  SIRS resolving.  Blood cultures negative. -Appreciate WOCN recs. -Appreciate input by palliative care.   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 -On sodium thiosulfate with HD per nephrology -Continue local wound care per WOCN recommendation  Nausea and vomiting: Likely from opiates.  She is on  significant dose of p.o. and IV Dilaudid.  Abdominal exam is benign.  She has no abdominal pain.  Having bowel movements. -Decrease IV Dilaudid to 0.5 mg.  Continue p.o. DilaudidI -Added IV Reglan for refractory nausea and vomiting   ESRD on HD TTS -Patient tells me that she was able to sit in chair for 4 hours but had HD in bed last night  History of CAD/CABG: Stable. -Continue home meds  Paroxysmal atrial fibrillation: Rate controlled. -Continue amiodarone and Eliquis  Uncontrolled NIDDM-2 with  hyperglycemia, ESRD and PAD: Last A1c 11.1% on 4/21.  Only takes Tradjenta at home. No results for input(s): HGBA1C in the last 72 hours. Recent Labs  Lab 12/05/20 0623 12/05/20 1048 12/05/20 1559 12/06/20 0612 12/06/20 1120  GLUCAP 106* 285* 130* 115* 113*  -Continue current SSI and Tradjenta -Follow hemoglobin A1c   ABLA superimposed on anemia of renal disease: Surgical blood loss? Recent Labs    11/25/20 1512 11/26/20 0550 11/27/20 0202 11/28/20 0233 11/29/20 0102 11/30/20 0156 12/01/20 0115 12/02/20 0151 12/03/20 0127 12/05/20 0156  HGB 9.2* 8.1* 8.4* 7.9* 8.2* 7.1* 6.8* 8.5* 9.6* 9.0*  -Transfused 1 unit on 6/23 with appropriate response. -Monitor H&H  Thrombocytopenia: Resolved. Recent Labs  Lab 11/30/20 0156 12/01/20 0115 12/02/20 0151 12/03/20 0127 12/05/20 0156  PLT 148* 140* 140* 155 169  -Continue monitoring   Moderate malnutrition Body mass index is 27.45 kg/m. Nutrition Problem: Moderate Malnutrition Etiology: chronic illness (PAD, ESRD on HD) Signs/Symptoms: mild fat depletion, moderate fat depletion, mild muscle depletion, moderate muscle depletion Interventions: MVI, Prostat, Magic cup, Glucerna shake  Pressure skin injury: POA Pressure Injury Coccyx Medial Stage 3 -  Full thickness tissue loss. Subcutaneous fat may be visible but bone, tendon or muscle are NOT exposed. 2 cm x 1 cm shallow bed with yellow/white (Active)     Location: Coccyx  Location Orientation: Medial  Staging: Stage 3 -  Full thickness tissue loss. Subcutaneous fat may be visible but bone, tendon or muscle are NOT exposed.  Wound Description (Comments): 2 cm x 1 cm shallow bed with yellow/white  Present on Admission: Yes     Pressure Injury 11/25/20 Thigh Right;Medial Stage 3 -  Full thickness tissue loss. Subcutaneous fat may be visible but bone, tendon or muscle are NOT exposed. (Active)  11/25/20 1945  Location: Thigh  Location Orientation: Right;Medial  Staging:  Stage 3 -  Full thickness tissue loss. Subcutaneous fat may be visible but bone, tendon or muscle are NOT exposed.  Wound Description (Comments):   Present on Admission: Yes     Pressure Injury Flank Left Unstageable - Full thickness tissue loss in which the base of the injury is covered by slough (yellow, tan, gray, green or brown) and/or eschar (tan, brown or black) in the wound bed. (Active)     Location: Flank  Location Orientation: Left  Staging: Unstageable - Full thickness tissue loss in which the base of the injury is covered by slough (yellow, tan, gray, green or brown) and/or eschar (tan, brown or black) in the wound bed.  Wound Description (Comments):   Present on Admission: Yes     Pressure Injury 11/28/20 Buttocks Left Unstageable - Full thickness tissue loss in which the base of the injury is covered by slough (yellow, tan, gray, green or brown) and/or eschar (tan, brown or black) in the wound bed. (Active)  11/28/20 0826  Location: Buttocks  Location Orientation: Left  Staging: Unstageable - Full thickness tissue loss in which the  base of the injury is covered by slough (yellow, tan, gray, green or brown) and/or eschar (tan, brown or black) in the wound bed.  Wound Description (Comments):   Present on Admission:      Pressure Injury 11/28/20 Buttocks Right Unstageable - Full thickness tissue loss in which the base of the injury is covered by slough (yellow, tan, gray, green or brown) and/or eschar (tan, brown or black) in the wound bed. (Active)  11/28/20 0826  Location: Buttocks  Location Orientation: Right  Staging: Unstageable - Full thickness tissue loss in which the base of the injury is covered by slough (yellow, tan, gray, green or brown) and/or eschar (tan, brown or black) in the wound bed.  Wound Description (Comments):   Present on Admission: Yes     Pressure Injury 11/28/20 Hip Left Unstageable - Full thickness tissue loss in which the base of the injury is  covered by slough (yellow, tan, gray, green or brown) and/or eschar (tan, brown or black) in the wound bed. (Active)  11/28/20 0827  Location: Hip  Location Orientation: Left  Staging: Unstageable - Full thickness tissue loss in which the base of the injury is covered by slough (yellow, tan, gray, green or brown) and/or eschar (tan, brown or black) in the wound bed.  Wound Description (Comments):   Present on Admission: Yes   DVT prophylaxis:   apixaban (ELIQUIS) tablet 5 mg  Code Status: Full code Family Communication: Patient and/or RN. Available if any question.  Level of care: Telemetry Medical Status is: Inpatient  Remains inpatient appropriate because:Unsafe d/c plan and Inpatient level of care appropriate due to severity of illness  Dispo: The patient is from: Home              Anticipated d/c is to: SNF              Patient currently is not medically stable to d/c.  Needs to tolerate HD in chair for 4 hours to continue outpatient HD.   Difficult to place patient No       Consultants:  Nephrology Vascular surgery Palliative medicine   Sch Meds:  Scheduled Meds:  (feeding supplement) PROSource Plus  30 mL Oral TID BM   amiodarone  200 mg Oral Daily   apixaban  5 mg Oral BID   vitamin C  500 mg Oral BID   aspirin EC  81 mg Oral Daily   atorvastatin  20 mg Oral Daily   Chlorhexidine Gluconate Cloth  6 each Topical Q0600   Chlorhexidine Gluconate Cloth  6 each Topical Q0600   darbepoetin (ARANESP) injection - DIALYSIS  200 mcg Intravenous Q Sat-HD   famotidine  20 mg Oral Daily   folic acid  1 mg Oral Daily   insulin aspart  0-6 Units Subcutaneous TID WC   lidocaine  1 application Topical X4I   linagliptin  5 mg Oral Daily   multivitamin  1 tablet Oral QHS   sevelamer carbonate  800 mg Oral TID WC   Continuous Infusions:  [START ON 12/07/2020] sodium thiosulfate infusion for calciphylaxis     PRN Meds:.diphenhydrAMINE **AND** acetaminophen, acetaminophen,  bisacodyl, diclofenac Sodium, docusate sodium, fluticasone, HYDROmorphone (DILAUDID) injection, HYDROmorphone, metoCLOPramide (REGLAN) injection, montelukast, ondansetron (ZOFRAN) IV  Antimicrobials: Anti-infectives (From admission, onward)    Start     Dose/Rate Route Frequency Ordered Stop   11/29/20 1800  ceFEPIme (MAXIPIME) 2 g in sodium chloride 0.9 % 100 mL IVPB  2 g 200 mL/hr over 30 Minutes Intravenous  Once 11/29/20 0908 11/29/20 2046   11/26/20 1700  ceFEPIme (MAXIPIME) 1 g in sodium chloride 0.9 % 100 mL IVPB  Status:  Discontinued        1 g 200 mL/hr over 30 Minutes Intravenous Every 24 hours 11/25/20 1713 11/29/20 0908   11/26/20 1200  vancomycin (VANCOREADY) IVPB 750 mg/150 mL  Status:  Discontinued        750 mg 150 mL/hr over 60 Minutes Intravenous Every T-Th-Sa (Hemodialysis) 11/25/20 1714 11/29/20 0909   11/26/20 1126  vancomycin (VANCOREADY) 750 MG/150ML IVPB       Note to Pharmacy: Cherylann Banas   : cabinet override      11/26/20 1126 11/26/20 1200   11/26/20 1125  vancomycin (VANCOREADY) 750 MG/150ML IVPB       Note to Pharmacy: Cherylann Banas   : cabinet override      11/26/20 1125 11/26/20 1159   11/25/20 1700  vancomycin (VANCOREADY) IVPB 1250 mg/250 mL        1,250 mg 166.7 mL/hr over 90 Minutes Intravenous  Once 11/25/20 1645 11/26/20 0053   11/25/20 1645  ceFEPIme (MAXIPIME) 2 g in sodium chloride 0.9 % 100 mL IVPB        2 g 200 mL/hr over 30 Minutes Intravenous  Once 11/25/20 1644 11/25/20 2255   11/25/20 1500  clindamycin (CLEOCIN) IVPB 600 mg        600 mg 100 mL/hr over 30 Minutes Intravenous  Once 11/25/20 1459 11/25/20 1606        I have personally reviewed the following labs and images: CBC: Recent Labs  Lab 11/30/20 0156 12/01/20 0115 12/02/20 0151 12/03/20 0127 12/05/20 0156  WBC 15.4* 13.0* 12.2* 12.4* 11.3*  NEUTROABS  --   --   --  9.0*  --   HGB 7.1* 6.8* 8.5* 9.6* 9.0*  HCT 22.7* 21.6* 27.7* 30.8* 29.1*  MCV 106.1* 105.4*  101.5* 101.3* 101.7*  PLT 148* 140* 140* 155 169   BMP &GFR Recent Labs  Lab 11/30/20 0156 12/01/20 0115 12/02/20 0151 12/03/20 0127 12/05/20 0156  NA 136 135 135 138 135  K 3.3* 4.1 3.8 4.2 4.7  CL 100 102 99 104 99  CO2 26 26 27 28 27   GLUCOSE 120* 166* 116* 106* 133*  BUN 17 32* 20 38* 33*  CREATININE 2.73* 4.15* 2.78* 4.38* 3.90*  CALCIUM 7.6* 7.7* 7.9* 8.2* 8.3*  MG  --   --   --  2.2 2.4  PHOS  --   --   --  4.1 4.1   Estimated Creatinine Clearance: 13.2 mL/min (A) (by C-G formula based on SCr of 3.9 mg/dL (H)). Liver & Pancreas: Recent Labs  Lab 12/03/20 0127 12/05/20 0156  ALBUMIN 1.5* 1.7*   No results for input(s): LIPASE, AMYLASE in the last 168 hours. No results for input(s): AMMONIA in the last 168 hours. Diabetic: No results for input(s): HGBA1C in the last 72 hours. Recent Labs  Lab 12/05/20 0623 12/05/20 1048 12/05/20 1559 12/06/20 0612 12/06/20 1120  GLUCAP 106* 285* 130* 115* 113*   Cardiac Enzymes: No results for input(s): CKTOTAL, CKMB, CKMBINDEX, TROPONINI in the last 168 hours. No results for input(s): PROBNP in the last 8760 hours. Coagulation Profile: No results for input(s): INR, PROTIME in the last 168 hours.  Thyroid Function Tests: No results for input(s): TSH, T4TOTAL, FREET4, T3FREE, THYROIDAB in the last 72 hours. Lipid Profile: No results for input(s): CHOL,  HDL, LDLCALC, TRIG, CHOLHDL, LDLDIRECT in the last 72 hours. Anemia Panel: No results for input(s): VITAMINB12, FOLATE, FERRITIN, TIBC, IRON, RETICCTPCT in the last 72 hours. Urine analysis:    Component Value Date/Time   COLORURINE AMBER (A) 09/30/2020 1035   APPEARANCEUR CLOUDY (A) 09/30/2020 1035   LABSPEC 1.012 09/30/2020 1035   PHURINE 8.0 09/30/2020 1035   GLUCOSEU NEGATIVE 09/30/2020 1035   HGBUR NEGATIVE 09/30/2020 1035   BILIRUBINUR NEGATIVE 09/30/2020 1035   KETONESUR NEGATIVE 09/30/2020 1035   PROTEINUR 30 (A) 09/30/2020 1035   NITRITE NEGATIVE  09/30/2020 1035   LEUKOCYTESUR LARGE (A) 09/30/2020 1035   Sepsis Labs: Invalid input(s): PROCALCITONIN, Gervais  Microbiology: No results found for this or any previous visit (from the past 240 hour(s)).   Radiology Studies: No results found.    Rondell Pardon T. [place]  If 7PM-7AM, please contact night-coverage [email or web address] 12/06/2020, 2:43 PM

## 2020-12-06 NOTE — Progress Notes (Signed)
Mobility Specialist: Progress Note   12/06/20 1504  Mobility  Activity Transferred:  Chair to bed  Level of Assistance Minimal assist, patient does 75% or more  Assistive Device Front wheel walker  Distance Ambulated (ft) 2 ft  Mobility Out of bed to chair with meals  Mobility Response Tolerated well  Mobility performed by Mobility specialist  $Mobility charge 1 Mobility   Pt had no c/o during transfer. Pt back to bed per request with NT present to assist with pericare.   [facility] Heitor Steinhoff Mobility Specialist Mobility Specialist Phone: [phone]

## 2020-12-06 NOTE — Progress Notes (Signed)
Nutrition Follow-up  DOCUMENTATION CODES:   Non-severe (moderate) malnutrition in context of chronic illness  INTERVENTION:   No BM x 3 days- consider scheduled bowel regimen   ProSource Plus 30 ml TID, each supplement provides 100 kcals and 15 grams protein.  Magic cup TID with meals, each supplement provides 290 kcal and 9 grams of protein MVI daily   NUTRITION DIAGNOSIS:   Moderate Malnutrition related to chronic illness (PAD, ESRD on HD) as evidenced by mild fat depletion, moderate fat depletion, mild muscle depletion, moderate muscle depletion.  Ongoing  GOAL:   Patient will meet greater than or equal to 90% of their needs  Progressing   MONITOR:   PO intake, Supplement acceptance, Labs, Weight trends, Skin, I & O's  REASON FOR ASSESSMENT:   Consult Assessment of nutrition requirement/status  ASSESSMENT:   70 y.o. female with medical history significant of PAD s/p left and right leg bypasses, chronic bilateral toe ischemia, non-healing wound of right thigh after right femoropopliteal bypass, ESRD on HD TTS (since January), PAF on Eliquis, IDDM, HTN, CAD status post CABG, HTN, AAA, HLD, depression and recent COVID 72 who presented with worsening of gangrene-like changes on bilateral toes and on healing wound of right thigh and unhealing ulcers of bilateral hips.  6/20- s/p R TMA, excisional debridement R thigh vein harvest site  Patient endorses appetite has increased slightly. Had episode of nausea/vomiting this am, but resolved with zofran. Meal completions charted as 50-100% for her last six meals. Taking Prosource TID and Magic Cups off/on. Does not wish to have Ensure or Boost.   EDW: 59.5 kg  Current weight: 70.3 kg   Drip: sodium thiosulfate Medications: 500 mg vitamin C, aranesp, folic acid, SS novolog, tradjenta, renvela Labs: CBG 96-285  Diet Order:   Diet Order             DIET DYS 3 Fluid consistency: Thin; Fluid restriction: 1200 mL Fluid  Diet  effective now                   EDUCATION NEEDS:   Education needs have been addressed  Skin:  Skin Assessment: Skin Integrity Issues: Skin Integrity Issues:: Incisions, Unstageable, Stage III, Other (Comment) Stage III: rt thigh coccyx Unstageable: lt ischial tuberosity, rt buttocks, lt hip Incisions: clsoed rt foot, rt leg s/p rt TMA Other: necrotic lt toes  Last BM:  6/25  Height:   Ht Readings from Last 1 Encounters:  11/28/20 5\' 3"  (1.6 m)    Weight:   Wt Readings from Last 1 Encounters:  12/03/20 70.3 kg    Ideal Body Weight:  52.3 kg  BMI:  Body mass index is 27.45 kg/m.  Estimated Nutritional Needs:   Kcal:  1900-2100  Protein:  105-120 grams  Fluid:  1000 ml + UOP   Danzel Marszalek MS, RD, LDN, CNSC Clinical Nutrition Pager listed in [place]

## 2020-12-06 NOTE — Progress Notes (Signed)
Mobility Specialist: Progress Note   12/06/20 1231  Mobility  Activity Transferred:  Bed to chair  Level of Assistance Maximum assist, patient does 25-49%  Assistive Device Front wheel walker  Distance Ambulated (ft) 2 ft  Mobility Out of bed to chair with meals  Mobility Response Tolerated fair  Mobility performed by Mobility specialist  Bed Position Chair  $Mobility charge 1 Mobility   Pre-Mobility: 73 HR Post-Mobility: 88 HR  Pt c/o 3/10 pain pre-mobility but said her pain was increasing with mobility, no rating given. Pt required minA to stand and contact guard during transfer. Pt sitting in the chair. After reaching the chair pt c/o feeling like she needed to have a BM, unable to get pt to [facility] in time. NT present too assist with pericare.   [facility] Kathryn Hancock Mobility Specialist Mobility Specialist Phone: [phone]

## 2020-12-07 ENCOUNTER — Other Ambulatory Visit (HOSPITAL_COMMUNITY): Payer: Medicare Other

## 2020-12-07 ENCOUNTER — Encounter (HOSPITAL_COMMUNITY): Payer: Medicare Other

## 2020-12-07 ENCOUNTER — Ambulatory Visit: Payer: Medicare Other

## 2020-12-07 LAB — RENAL FUNCTION PANEL
Albumin: 1.9 g/dL — ABNORMAL LOW (ref 3.5–5.0)
Anion gap: 9 (ref 5–15)
BUN: 30 mg/dL — ABNORMAL HIGH (ref 8–23)
CO2: 27 mmol/L (ref 22–32)
Calcium: 8.9 mg/dL (ref 8.9–10.3)
Chloride: 102 mmol/L (ref 98–111)
Creatinine, Ser: 3.8 mg/dL — ABNORMAL HIGH (ref 0.44–1.00)
GFR, Estimated: 12 mL/min — ABNORMAL LOW (ref >60)
Glucose, Bld: 115 mg/dL — ABNORMAL HIGH (ref 70–99)
Phosphorus: 4.8 mg/dL — ABNORMAL HIGH (ref 2.5–4.6)
Potassium: 4.5 mmol/L (ref 3.5–5.1)
Sodium: 138 mmol/L (ref 135–145)

## 2020-12-07 LAB — CBC
HCT: 30.3 % — ABNORMAL LOW (ref 36.0–46.0)
Hemoglobin: 9.4 g/dL — ABNORMAL LOW (ref 12.0–15.0)
MCH: 32 pg (ref 26.0–34.0)
MCHC: 31 g/dL (ref 30.0–36.0)
MCV: 103.1 fL — ABNORMAL HIGH (ref 80.0–100.0)
Platelets: 196 10*3/uL (ref 150–400)
RBC: 2.94 MIL/uL — ABNORMAL LOW (ref 3.87–5.11)
RDW: 20.3 % — ABNORMAL HIGH (ref 11.5–15.5)
WBC: 10.5 10*3/uL (ref 4.0–10.5)
nRBC: 0 % (ref 0.0–0.2)

## 2020-12-07 LAB — HEMOGLOBIN A1C
Hgb A1c MFr Bld: 5 % (ref 4.8–5.6)
Mean Plasma Glucose: 97 mg/dL

## 2020-12-07 LAB — MAGNESIUM: Magnesium: 2.5 mg/dL — ABNORMAL HIGH (ref 1.7–2.4)

## 2020-12-07 LAB — GLUCOSE, CAPILLARY
Glucose-Capillary: 111 mg/dL — ABNORMAL HIGH (ref 70–99)
Glucose-Capillary: 112 mg/dL — ABNORMAL HIGH (ref 70–99)
Glucose-Capillary: 132 mg/dL — ABNORMAL HIGH (ref 70–99)

## 2020-12-07 MED ORDER — ONDANSETRON HCL 4 MG/2ML IJ SOLN
INTRAMUSCULAR | Status: AC
  Filled 2020-12-07: qty 2

## 2020-12-07 MED ORDER — HEPARIN SODIUM (PORCINE) 1000 UNIT/ML IJ SOLN
INTRAMUSCULAR | Status: AC
  Filled 2020-12-07: qty 4

## 2020-12-07 NOTE — Progress Notes (Signed)
PROGRESS NOTE  Kathryn Hancock JOA:[id number] DOB: [birth year]   PCP: Gala Lewandowsky, MD  Patient is from: Home  DOA: 11/25/2020 LOS: 49  Chief complaints:  Chief Complaint  Patient presents with   Wound Infection     Brief Narrative / Interim history: 70 year old F with PMH of ESRD on HD TTS, PAF on Eliquis, IDDM-2, CAD/CABG, PAD with b/l bypass, unhealing right thigh wound after right femoropopliteal bypass, HTN and HLD presenting with worsening gangrenous changes of all bilateral toes and nonhealing wound of right thigh and bilateral hips.  Vascular surgery consulted.  Patient underwent right TMA and excisional debridement of the right thigh with Dr. Scot Dock on on 6/20.   Plan for discharge to SNF once she tolerates HD in chair which seems to be very difficult given her extensive wound.  Palliative medicine consulted.  She remains full code with full scope of care.  Subjective: Seen and examined earlier this morning.  No major events overnight of this morning.  No complaint this morning.  Nausea and vomiting resolved.  He denies chest pain, dyspnea, or GI symptoms.  Objective: Vitals:   12/07/20 1200 12/07/20 1230 12/07/20 1300 12/07/20 1330  BP: 132/74 132/61 (!) 109/57 (!) 105/56  Pulse: 71 72    Resp:   13 15  Temp:      TempSrc:      SpO2:      Weight:      Height:        Intake/Output Summary (Last 24 hours) at 12/07/2020 1404 Last data filed at 12/06/2020 2317 Gross per 24 hour  Intake 360 ml  Output 0 ml  Net 360 ml    Filed Weights   12/03/20 1828 12/07/20 0250 12/07/20 1115  Weight: 70.3 kg 63 kg 64.5 kg    Examination:  GENERAL: No apparent distress.  Nontoxic. HEENT: MMM.  Vision and hearing grossly intact.  NECK: Supple.  No apparent JVD.  RESP: On RA.  No IWOB.  Fair aeration bilaterally. CVS:  RRR. Heart sounds normal.  ABD/GI/GU: BS+. Abd soft, NTND.  MSK/EXT:  Moves extremities. S/p right TMA.  1+ edema on the right shin.  Dry gangrene TTP of  left foot toes. SKIN: Dressing over right foot and right thigh DCI.  Extensive sacral and hip wounds. NEURO: Awake and alert. Oriented appropriately.  No apparent focal neuro deficit. PSYCH: Calm. Normal affect.   Procedures:  6/20-right TMA and excisional debridement of right thigh by Dr. Scot Dock  Microbiology summarized: COVID-19 and influenza PCR nonreactive. Blood cultures NGTD.   Assessment & Plan: Nonhealing ulcer of the right medial thigh at the site of saphenectomy incision Bilateral PAD with bilateral gangrenous changes in toes Extensive pressure skin injury of bilateral buttocks and left hip as below -S/p right TMA and excisional debridement by vascular surgery on 6/20 -VVS recs: daily dressing for right TMA wound and BID WTD dressing for right thigh wound -Outpatient follow-up with Dr. Scot Dock in 2 weeks -Continue statin and aspirin. -Sepsis ruled out.  SIRS resolved.  Blood cultures negative. -Appreciate WOCN recs. -Appreciate input by palliative care.   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 -On sodium thiosulfate with HD per nephrology -Continue local wound care per WOCN recommendation  Nausea and vomiting: Likely from opiates.  Resolved. -Decreased  IV Dilaudid to 0.5 mg.  Continue p.o. DilaudidI -Added IV Reglan for refractory nausea and vomiting   ESRD on HD TTS -HD per nephrology.  Needs to tolerate HD  in chair for 4 hours  History of CAD/CABG: Stable. -Continue home meds  Paroxysmal atrial fibrillation: Rate controlled. -Continue amiodarone and Eliquis  Uncontrolled NIDDM-2 with hyperglycemia, ESRD and PAD: A1c 5.0% (from 11.1% on 4/21).  Only takes Tradjenta at home. Recent Labs  Lab 12/06/20 0612 12/06/20 1120 12/06/20 1607 12/06/20 2118 12/07/20 0619  GLUCAP 115* 113* 116* 161* 111*  -Continue current SSI and Tradjenta   ABLA superimposed on anemia of renal disease: Surgical blood loss? Recent Labs     11/26/20 0550 11/27/20 0202 11/28/20 0233 11/29/20 0102 11/30/20 0156 12/01/20 0115 12/02/20 0151 12/03/20 0127 12/05/20 0156 12/07/20 0150  HGB 8.1* 8.4* 7.9* 8.2* 7.1* 6.8* 8.5* 9.6* 9.0* 9.4*  -Transfused 1 unit on 6/23 with appropriate response. -Monitor H&H  Thrombocytopenia: Resolved. Recent Labs  Lab 12/01/20 0115 12/02/20 0151 12/03/20 0127 12/05/20 0156 12/07/20 0150  PLT 140* 140* 155 169 196  -Continue monitoring   Moderate malnutrition Body mass index is 25.19 kg/m. Nutrition Problem: Moderate Malnutrition Etiology: chronic illness (PAD, ESRD on HD) Signs/Symptoms: mild fat depletion, moderate fat depletion, mild muscle depletion, moderate muscle depletion Interventions: MVI, Prostat, Magic cup, Glucerna shake  Pressure skin injury: POA Pressure Injury Coccyx Medial Stage 3 -  Full thickness tissue loss. Subcutaneous fat may be visible but bone, tendon or muscle are NOT exposed. 2 cm x 1 cm shallow bed with yellow/white (Active)     Location: Coccyx  Location Orientation: Medial  Staging: Stage 3 -  Full thickness tissue loss. Subcutaneous fat may be visible but bone, tendon or muscle are NOT exposed.  Wound Description (Comments): 2 cm x 1 cm shallow bed with yellow/white  Present on Admission: Yes     Pressure Injury 11/25/20 Thigh Right;Medial Stage 3 -  Full thickness tissue loss. Subcutaneous fat may be visible but bone, tendon or muscle are NOT exposed. (Active)  11/25/20 1945  Location: Thigh  Location Orientation: Right;Medial  Staging: Stage 3 -  Full thickness tissue loss. Subcutaneous fat may be visible but bone, tendon or muscle are NOT exposed.  Wound Description (Comments):   Present on Admission: Yes     Pressure Injury Flank Left Unstageable - Full thickness tissue loss in which the base of the injury is covered by slough (yellow, tan, gray, green or brown) and/or eschar (tan, brown or black) in the wound bed. (Active)     Location:  Flank  Location Orientation: Left  Staging: Unstageable - Full thickness tissue loss in which the base of the injury is covered by slough (yellow, tan, gray, green or brown) and/or eschar (tan, brown or black) in the wound bed.  Wound Description (Comments):   Present on Admission: Yes     Pressure Injury 11/28/20 Buttocks Left Unstageable - Full thickness tissue loss in which the base of the injury is covered by slough (yellow, tan, gray, green or brown) and/or eschar (tan, brown or black) in the wound bed. (Active)  11/28/20 0826  Location: Buttocks  Location Orientation: Left  Staging: Unstageable - Full thickness tissue loss in which the base of the injury is covered by slough (yellow, tan, gray, green or brown) and/or eschar (tan, brown or black) in the wound bed.  Wound Description (Comments):   Present on Admission:      Pressure Injury 11/28/20 Buttocks Right Unstageable - Full thickness tissue loss in which the base of the injury is covered by slough (yellow, tan, gray, green or brown) and/or eschar (tan, brown or black) in the  wound bed. (Active)  11/28/20 0826  Location: Buttocks  Location Orientation: Right  Staging: Unstageable - Full thickness tissue loss in which the base of the injury is covered by slough (yellow, tan, gray, green or brown) and/or eschar (tan, brown or black) in the wound bed.  Wound Description (Comments):   Present on Admission: Yes     Pressure Injury 11/28/20 Hip Left Unstageable - Full thickness tissue loss in which the base of the injury is covered by slough (yellow, tan, gray, green or brown) and/or eschar (tan, brown or black) in the wound bed. (Active)  11/28/20 0827  Location: Hip  Location Orientation: Left  Staging: Unstageable - Full thickness tissue loss in which the base of the injury is covered by slough (yellow, tan, gray, green or brown) and/or eschar (tan, brown or black) in the wound bed.  Wound Description (Comments):   Present on  Admission: Yes   DVT prophylaxis:   apixaban (ELIQUIS) tablet 5 mg  Code Status: Full code Family Communication: Patient and/or RN. Available if any question.  Level of care: Telemetry Medical Status is: Inpatient  Remains inpatient appropriate because:Unsafe d/c plan and Inpatient level of care appropriate due to severity of illness  Dispo: The patient is from: Home              Anticipated d/c is to: SNF              Patient currently is not medically stable to d/c.  Needs to tolerate HD in chair for 4 hours to continue outpatient HD.   Difficult to place patient No       Consultants:  Nephrology Vascular surgery Palliative medicine   Sch Meds:  Scheduled Meds:  (feeding supplement) PROSource Plus  30 mL Oral TID BM   amiodarone  200 mg Oral Daily   apixaban  5 mg Oral BID   vitamin C  500 mg Oral BID   aspirin EC  81 mg Oral Daily   atorvastatin  20 mg Oral Daily   Chlorhexidine Gluconate Cloth  6 each Topical Q0600   darbepoetin (ARANESP) injection - DIALYSIS  200 mcg Intravenous Q Sat-HD   famotidine  20 mg Oral Daily   folic acid  1 mg Oral Daily   insulin aspart  0-6 Units Subcutaneous TID WC   lidocaine  1 application Topical Z6X   linagliptin  5 mg Oral Daily   multivitamin  1 tablet Oral QHS   ondansetron       sevelamer carbonate  800 mg Oral TID WC   Continuous Infusions:  sodium thiosulfate infusion for calciphylaxis 25 g (12/07/20 1304)   PRN Meds:.diphenhydrAMINE **AND** acetaminophen, acetaminophen, bisacodyl, diclofenac Sodium, docusate sodium, fluticasone, HYDROmorphone (DILAUDID) injection, HYDROmorphone, metoCLOPramide (REGLAN) injection, montelukast, ondansetron (ZOFRAN) IV  Antimicrobials: Anti-infectives (From admission, onward)    Start     Dose/Rate Route Frequency Ordered Stop   11/29/20 1800  ceFEPIme (MAXIPIME) 2 g in sodium chloride 0.9 % 100 mL IVPB        2 g 200 mL/hr over 30 Minutes Intravenous  Once 11/29/20 0908 11/29/20  2046   11/26/20 1700  ceFEPIme (MAXIPIME) 1 g in sodium chloride 0.9 % 100 mL IVPB  Status:  Discontinued        1 g 200 mL/hr over 30 Minutes Intravenous Every 24 hours 11/25/20 1713 11/29/20 0908   11/26/20 1200  vancomycin (VANCOREADY) IVPB 750 mg/150 mL  Status:  Discontinued  750 mg 150 mL/hr over 60 Minutes Intravenous Every T-Th-Sa (Hemodialysis) 11/25/20 1714 11/29/20 0909   11/26/20 1126  vancomycin (VANCOREADY) 750 MG/150ML IVPB       Note to Pharmacy: Cherylann Banas   : cabinet override      11/26/20 1126 11/26/20 1200   11/26/20 1125  vancomycin (VANCOREADY) 750 MG/150ML IVPB       Note to Pharmacy: Cherylann Banas   : cabinet override      11/26/20 1125 11/26/20 1159   11/25/20 1700  vancomycin (VANCOREADY) IVPB 1250 mg/250 mL        1,250 mg 166.7 mL/hr over 90 Minutes Intravenous  Once 11/25/20 1645 11/26/20 0053   11/25/20 1645  ceFEPIme (MAXIPIME) 2 g in sodium chloride 0.9 % 100 mL IVPB        2 g 200 mL/hr over 30 Minutes Intravenous  Once 11/25/20 1644 11/25/20 2255   11/25/20 1500  clindamycin (CLEOCIN) IVPB 600 mg        600 mg 100 mL/hr over 30 Minutes Intravenous  Once 11/25/20 1459 11/25/20 1606        I have personally reviewed the following labs and images: CBC: Recent Labs  Lab 12/01/20 0115 12/02/20 0151 12/03/20 0127 12/05/20 0156 12/07/20 0150  WBC 13.0* 12.2* 12.4* 11.3* 10.5  NEUTROABS  --   --  9.0*  --   --   HGB 6.8* 8.5* 9.6* 9.0* 9.4*  HCT 21.6* 27.7* 30.8* 29.1* 30.3*  MCV 105.4* 101.5* 101.3* 101.7* 103.1*  PLT 140* 140* 155 169 196   BMP &GFR Recent Labs  Lab 12/01/20 0115 12/02/20 0151 12/03/20 0127 12/05/20 0156 12/07/20 0150  NA 135 135 138 135 138  K 4.1 3.8 4.2 4.7 4.5  CL 102 99 104 99 102  CO2 26 27 28 27 27   GLUCOSE 166* 116* 106* 133* 115*  BUN 32* 20 38* 33* 30*  CREATININE 4.15* 2.78* 4.38* 3.90* 3.80*  CALCIUM 7.7* 7.9* 8.2* 8.3* 8.9  MG  --   --  2.2 2.4 2.5*  PHOS  --   --  4.1 4.1 4.8*   Estimated  Creatinine Clearance: 13 mL/min (A) (by C-G formula based on SCr of 3.8 mg/dL (H)). Liver & Pancreas: Recent Labs  Lab 12/03/20 0127 12/05/20 0156 12/07/20 0150  ALBUMIN 1.5* 1.7* 1.9*   No results for input(s): LIPASE, AMYLASE in the last 168 hours. No results for input(s): AMMONIA in the last 168 hours. Diabetic: Recent Labs    12/06/20 0507  HGBA1C 5.0   Recent Labs  Lab 12/06/20 0612 12/06/20 1120 12/06/20 1607 12/06/20 2118 12/07/20 0619  GLUCAP 115* 113* 116* 161* 111*   Cardiac Enzymes: No results for input(s): CKTOTAL, CKMB, CKMBINDEX, TROPONINI in the last 168 hours. No results for input(s): PROBNP in the last 8760 hours. Coagulation Profile: No results for input(s): INR, PROTIME in the last 168 hours.  Thyroid Function Tests: No results for input(s): TSH, T4TOTAL, FREET4, T3FREE, THYROIDAB in the last 72 hours. Lipid Profile: No results for input(s): CHOL, HDL, LDLCALC, TRIG, CHOLHDL, LDLDIRECT in the last 72 hours. Anemia Panel: No results for input(s): VITAMINB12, FOLATE, FERRITIN, TIBC, IRON, RETICCTPCT in the last 72 hours. Urine analysis:    Component Value Date/Time   COLORURINE AMBER (A) 09/30/2020 1035   APPEARANCEUR CLOUDY (A) 09/30/2020 1035   LABSPEC 1.012 09/30/2020 1035   PHURINE 8.0 09/30/2020 1035   GLUCOSEU NEGATIVE 09/30/2020 1035   HGBUR NEGATIVE 09/30/2020 [place] 09/30/2020  [place] 09/30/2020 1035   PROTEINUR 30 (A) 09/30/2020 1035   NITRITE NEGATIVE 09/30/2020 1035   LEUKOCYTESUR LARGE (A) 09/30/2020 1035   Sepsis Labs: Invalid input(s): PROCALCITONIN, [place]  Microbiology: No results found for this or any previous visit (from the past 240 hour(s)).   Radiology Studies: No results found.    Enna Warwick T. [place]  If 7PM-7AM, please contact night-coverage [email or web address] 12/07/2020, 2:04 PM

## 2020-12-07 NOTE — Progress Notes (Signed)
This chaplain responded to PMT referral for Pt. F/U spiritual care. The Pt. is awake and appreciative of the visit.     The chaplain offered a listening presence as the Pt. shared more about her family relationships and the importance of family.  The chaplain walked with the Pt. to HD and offered prayer.    The chaplain will F/U with spiritual care.

## 2020-12-07 NOTE — Progress Notes (Addendum)
Physical Therapy Treatment Patient Details Name: Kathryn Hancock MRN: [id number] DOB: 1950-08-26 Today's Date: 12/07/2020    History of Present Illness pt is a 70 y/o female presenting 6/17 with worsening of gangrene-like changes on her bil toes, non-healing wounds right thigh and non healing ulcers of bil hips.  Pt s/p right TMA and excisional debridement of right thight 6/20.  PMHx:  CAD, DM, ESRD, HTN, PAF, AAA.    PT Comments    Pt received in supine, agreeable to therapy session and with good participation and fair tolerance for transfer training. Pt encouraged to attempt further gait distance but declined due to fatigue after stand pivot transfers x2 and dialysis earlier in day. Pt needing up to minA and RW support for pivot transfers and heavy use of bed features/increased time for bed mobility. Reviewed HEP handout and pt reports she has not had time/energy to complete, encouraged her to attempt at least isometric exercises in supine (quad sets, glute sets, ankle pumps) for strength maintenance. Pt asking to get back up to bed after sitting upright in chair ~10 mins, encouraged her to try to sit up at least an hour to get more used to chair posture for HD. Pt unable to tolerate reclined posture in chair due to bilateral buttock wounds. Pt continues to benefit from PT services to progress toward functional mobility goals.    Follow Up Recommendations  Home health PT;Supervision/Assistance - 24 hour;Other (comment) (pt wishes to attempt home with 24 hour assist from her daughter at a w/c level if needed, but wants to walk short distance.)     Equipment Recommendations  None recommended by PT;Other (comment) (TBA)    Recommendations for Other Services       Precautions / Restrictions Precautions Precautions: Fall Restrictions Weight Bearing Restrictions: Yes RLE Weight Bearing: Partial weight bearing Other Position/Activity Restrictions: PWB through R heel with Darco shoe donned     Mobility  Bed Mobility Overal bed mobility: Needs Assistance Bed Mobility: Supine to Sit     Supine to sit: Min assist;HOB elevated     General bed mobility comments: cues for log roll to prevent shearing on buttocks but pt ignoring cues, good initiation, does need HOB elevated and assist from bed rail; minA at most for anterior scooting to foot flat and steadying    Transfers Overall transfer level: Needs assistance Equipment used: Rolling walker (2 wheeled) Transfers: Sit to/from [facility] Sit to Stand: Min assist;From elevated surface Stand pivot transfers: Min assist       General transfer comment: EOB>BSC and BSC>chair  Ambulation/Gait Ambulation/Gait assistance: Min Web designer (Feet): 4 Feet x2 (to BSC, then BSC to chair) Assistive device: Rolling walker (2 wheeled) Gait Pattern/deviations: Step-to pattern;Antalgic;Trunk flexed     General Gait Details: heavy reliance on RW, pt defers longer gait trial due to pain/discomfort, pt encouraged to have her daughter bring an extra pair of shoes in for a more even heel height but pt defers, states "I only have 1 pair of shoes and it's all I can afford."   Stairs             Wheelchair Mobility    Modified Rankin (Stroke Patients Only)       Balance Overall balance assessment: Needs assistance Sitting-balance support: Feet supported;Feet unsupported;Single extremity supported Sitting balance-Leahy Scale: Fair     Standing balance support: Bilateral upper extremity supported Standing balance-Leahy Scale: Poor Standing balance comment: minA for static/dynamic tasks with RW  Cognition Arousal/Alertness: Awake/alert Behavior During Therapy: WFL for tasks assessed/performed;Anxious Overall Cognitive Status: Within Functional Limits for tasks assessed                                 General Comments: pt agreeable to get OOB with  encouragement, mildly anxious but participatory as able.      Exercises      General Comments        Pertinent Vitals/Pain Pain Assessment: Faces Faces Pain Scale: Hurts even more Pain Location: R foot/amp site and tailbone/sites of B buttock wounds Pain Descriptors / Indicators: Grimacing;Throbbing;Aching;Discomfort Pain Intervention(s): Limited activity within patient's tolerance;Monitored during session;Repositioned    Home Living                      Prior Function            PT Goals (current goals can now be found in the care plan section) Acute Rehab PT Goals Patient Stated Goal: want to get home. walk if I can, w/c if I need to. PT Goal Formulation: With patient Time For Goal Achievement: 12/14/20 Progress towards PT goals: Progressing toward goals    Frequency    Min 3X/week      PT Plan Current plan remains appropriate    Co-evaluation              AM-PAC PT "6 Clicks" Mobility   Outcome Measure  Help needed turning from your back to your side while in a flat bed without using bedrails?: A Little Help needed moving from lying on your back to sitting on the side of a flat bed without using bedrails?: A Little Help needed moving to and from a bed to a chair (including a wheelchair)?: A Little Help needed standing up from a chair using your arms (e.g., wheelchair or bedside chair)?: A Little Help needed to walk in hospital room?: A Lot Help needed climbing 3-5 steps with a railing? : Total 6 Click Score: 15    End of Session Equipment Utilized During Treatment: Gait belt Activity Tolerance: Patient limited by pain;Patient limited by fatigue Patient left: with call bell/phone within reach;in bed;with bed alarm set;Other (comment) (chaplain Dorian Pod in room to speak with pt, heels floated) Nurse Communication: Mobility status PT Visit Diagnosis: Other abnormalities of gait and mobility (R26.89);Muscle weakness (generalized)  (M62.81);Difficulty in walking, not elsewhere classified (R26.2);Pain Pain - part of body: Hip;Leg;Ankle and joints of foot (multiple painful wounds and acute pain R amputation site; tailbone pain)     Time: 1308-6578 PT Time Calculation (min) (ACUTE ONLY): 24 min  Charges:  $Gait Training: 8-22 mins $Therapeutic Activity: 8-22 mins                     Leandro Berkowitz P., PTA Acute Rehabilitation Services Pager: [phone] Office: [place] 12/07/2020, 5:34 PM

## 2020-12-07 NOTE — Progress Notes (Signed)
Pt back from Dialysis. A/ox4. VSS. Will continue to monitor      12/07/20 1528  Vitals  Temp 98.1 F (36.7 C)  Temp Source Oral  BP (!) 124/59  MAP (mmHg) 71  BP Location Right Arm  BP Method Automatic  Patient Position (if appropriate) Lying  Pulse Rate 74  Pulse Rate Source Monitor  ECG Heart Rate 74  Resp 15  Level of Consciousness  Level of Consciousness Alert  Oxygen Therapy  SpO2 99 %  O2 Device Room Air  O2 Flow Rate (L/min) 0 L/min  Pain Assessment  Pain Scale 0-10  Pain Score 3  MEWS Score  MEWS Temp 0  MEWS Systolic 0  MEWS Pulse 0  MEWS RR 0  MEWS LOC 0  MEWS Score 0  MEWS Score Color Nyoka Cowden

## 2020-12-07 NOTE — Progress Notes (Signed)
[facility] Progress Note   Subjective:   Seen on HD, no c/o's today  Objective Vitals:   12/07/20 1400 12/07/20 1415 12/07/20 1424 12/07/20 1425  BP: (!) 155/71 (!) 123/59 (!) 123/59 (!) 145/65  Pulse:      Resp: 15 14 14 14   Temp:   98 F (36.7 C)   TempSrc:   Oral   SpO2:   100%   Weight:   62 kg   Height:       Physical Exam General: Chronically ill appearing woman, NAD Heart: RRR; no murmur Lungs: CTA anteriorly Abdomen: soft, non-tender Extremities: no LE edema, R TMA site bandaged. All L toes with dry gangrene. R thigh bandaged.  Dialysis Access: TDC in R chest, LUE AVF + bruit and sig hematoma  Additional Objective Labs: Basic Metabolic Panel: Recent Labs  Lab 12/03/20 0127 12/05/20 0156 12/07/20 0150  NA 138 135 138  K 4.2 4.7 4.5  CL 104 99 102  CO2 28 27 27   GLUCOSE 106* 133* 115*  BUN 38* 33* 30*  CREATININE 4.38* 3.90* 3.80*  CALCIUM 8.2* 8.3* 8.9  PHOS 4.1 4.1 4.8*    Liver Function Tests: Recent Labs  Lab 12/03/20 0127 12/05/20 0156 12/07/20 0150  ALBUMIN 1.5* 1.7* 1.9*    CBC: Recent Labs  Lab 12/01/20 0115 12/02/20 0151 12/03/20 0127 12/05/20 0156 12/07/20 0150  WBC 13.0* 12.2* 12.4* 11.3* 10.5  NEUTROABS  --   --  9.0*  --   --   HGB 6.8* 8.5* 9.6* 9.0* 9.4*  HCT 21.6* 27.7* 30.8* 29.1* 30.3*  MCV 105.4* 101.5* 101.3* 101.7* 103.1*  PLT 140* 140* 155 169 196    Blood Culture    Component Value Date/Time   SDES BLOOD RIGHT HAND 11/25/2020 2301   SPECREQUEST  11/25/2020 2301    BOTTLES DRAWN AEROBIC ONLY Blood Culture results may not be optimal due to an inadequate volume of blood received in culture bottles   CULT  11/25/2020 2301    NO GROWTH 5 DAYS Performed at [facility], [place] [address] [place], [place] [address]    REPTSTATUS 12/01/2020 FINAL 11/25/2020 2301   Studies/Results: No results found.  Medications:  sodium thiosulfate infusion for calciphylaxis 25 g (12/07/20 1304)     (feeding supplement) PROSource Plus  30 mL Oral TID BM   amiodarone  200 mg Oral Daily   apixaban  5 mg Oral BID   vitamin C  500 mg Oral BID   aspirin EC  81 mg Oral Daily   atorvastatin  20 mg Oral Daily   Chlorhexidine Gluconate Cloth  6 each Topical Q0600   darbepoetin (ARANESP) injection - DIALYSIS  200 mcg Intravenous Q Sat-HD   famotidine  20 mg Oral Daily   folic acid  1 mg Oral Daily   heparin sodium (porcine)       insulin aspart  0-6 Units Subcutaneous TID WC   lidocaine  1 application Topical P8E   linagliptin  5 mg Oral Daily   multivitamin  1 tablet Oral QHS   ondansetron       sevelamer carbonate  800 mg Oral TID WC    Dialysis Orders: Changing to MWF here for SNFP (was TTS [facility]) 3h 49min  250/ 500 (new cannulation protocol AVF) 59.5kg 3K/ 2.5 bath  AVF and TDC  Hep none  - Venofer 100mg  x 5 ordered - Mircera 136mcg IV q 2 weeks (last 6/14)   Assessment/Plan:  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  Calciphylaxis wounds: B hip/sacrum. R thigh wound debrided on 6/20. Plan to treat empirically with sodium thiosulfate and discontinue all Ca/Vit D/iron products.  ESRD: changing to MWF for rehab/ SNF purposes (usual HD is TTS). Use TDC while inpatient. Pt sitting up in chair x 4 hrs on 6/28 in her room. Next HD should be in a chair.   Hypertension/volume: Hypotensive on admit - home meds on hold. Stable BPs now.  UF as tolerated. Wt's are off by 10kg. No vol ^ on exam.   Anemia: Hgb down to 7; transfuse as needed- got one unit 6/23. Aranesp 200 weekly. 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  Metabolic bone disease: calciphylaxis-  CorrCa ok, Phos 6.2. D/c'd Vit D3 and changed binder to Renvela. Using 2Ca bath with dialysis as well as sodium thiosulfate tiw IV w/ HD.   Nutrition:  Alb very low  CAD  PAD  T2DM  A-fib on Eliquis  GOC: Needs ongoing dialogue and will see how wounds progress.  For SNF placement   Sol Blazing, MD  12/07/2020,  2:45 PM  [facility]

## 2020-12-08 LAB — GLUCOSE, CAPILLARY
Glucose-Capillary: 101 mg/dL — ABNORMAL HIGH (ref 70–99)
Glucose-Capillary: 148 mg/dL — ABNORMAL HIGH (ref 70–99)
Glucose-Capillary: 192 mg/dL — ABNORMAL HIGH (ref 70–99)
Glucose-Capillary: 145 mg/dL — ABNORMAL HIGH (ref 70–99)

## 2020-12-08 MED ORDER — HYDROCORTISONE ACETATE 25 MG RE SUPP
25.0000 mg | Freq: Two times a day (BID) | RECTAL | Status: DC
  Administered 2020-12-08 – 2020-12-13 (×7): 25 mg via RECTAL
  Filled 2020-12-08 (×13): qty 1

## 2020-12-08 NOTE — Progress Notes (Signed)
PROGRESS NOTE  Kathryn Hancock DSK:[id number] DOB: 1950/08/06   PCP: Gala Lewandowsky, MD  Patient is from: Home  DOA: 11/25/2020 LOS: 44  Chief complaints:  Chief Complaint  Patient presents with   Wound Infection     Brief Narrative / Interim history: 70 year old F with PMH of ESRD on HD TTS, PAF on Eliquis, IDDM-2, CAD/CABG, PAD with b/l bypass, unhealing right thigh wound after right femoropopliteal bypass, HTN and HLD presenting with worsening gangrenous changes of all bilateral toes and nonhealing wound of right thigh and bilateral hips.  Vascular surgery consulted.  Patient underwent right TMA and excisional debridement of the right thigh with Dr. Scot Dock on on 6/20.   Plan for discharge to SNF once she tolerates HD in chair which seems to be very difficult given her extensive wound.  Palliative medicine consulted.  She remains full code with full scope of care.  Subjective: Seen and examined earlier this morning.  No major events overnight of this morning.  No complaints.  She denies chest pain, dyspnea or GI symptoms.  Objective: Vitals:   12/08/20 0539 12/08/20 0730 12/08/20 1115 12/08/20 1604  BP: (!) 143/56 (!) 121/54 (!) 98/52 (!) 108/50  Pulse: 70 70 80 70  Resp: 20 20 20 20   Temp: 97.7 F (36.5 C) 97.7 F (36.5 C) 98.1 F (36.7 C) 98.2 F (36.8 C)  TempSrc: Oral Oral Oral Oral  SpO2: 98% 99% 96% 98%  Weight:      Height:        Intake/Output Summary (Last 24 hours) at 12/08/2020 1732 Last data filed at 12/08/2020 0500 Gross per 24 hour  Intake 240 ml  Output 0 ml  Net 240 ml    Filed Weights   12/07/20 0250 12/07/20 1115 12/07/20 1424  Weight: 63 kg 64.5 kg 62 kg    Examination:  GENERAL: No apparent distress.  Nontoxic. HEENT: MMM.  Vision and hearing grossly intact.  NECK: Supple.  No apparent JVD.  RESP: On RA.  No IWOB.  Fair aeration bilaterally. CVS:  RRR. Heart sounds normal.  ABD/GI/GU: BS+. Abd soft, NTND.  MSK/EXT:  Moves extremities. No  apparent deformity.  Trace edema in right shin.  Dry gangrene left toes. SKIN: Dressing over right TMA and right thigh DCI.  Extensive sacral and hip wounds. NEURO: Awake and alert. Oriented appropriately.  No apparent focal neuro deficit. PSYCH: Calm. Normal affect.   Procedures:  6/20-right TMA and excisional debridement of right thigh by Dr. Scot Dock  Microbiology summarized: COVID-19 and influenza PCR nonreactive. Blood cultures NGTD.   Assessment & Plan: Nonhealing ulcer of the right medial thigh at the site of saphenectomy incision Bilateral PAD with bilateral gangrenous changes in toes Extensive pressure skin injury of bilateral buttocks and left hip as below -S/p right TMA and excisional debridement by vascular surgery on 6/20 -VVS recs: daily dressing for right TMA wound and BID WTD dressing for right thigh wound -Outpatient follow-up with Dr. Scot Dock in 2 weeks -Continue statin and aspirin. -Sepsis ruled out.  SIRS resolved.  Blood cultures negative. -Appreciate WOCN recs. -Appreciate input by palliative care.  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 -On sodium thiosulfate with HD per nephrology -Continue local wound care per WOCN recommendation  Nausea and vomiting: Likely from opiates.  Resolved. -Decreased  IV Dilaudid to 0.5 mg.  Continue p.o. DilaudidI -Added IV Reglan for refractory nausea and vomiting   ESRD on HD changed from TTS to MWF -HD per  nephrology.  Needs to tolerate HD in chair for 4 hours.  Sat in chair for 4 hours on 6/28.  History of CAD/CABG: Stable. -Continue home meds  Paroxysmal atrial fibrillation: Rate controlled. -Continue amiodarone and Eliquis  Uncontrolled NIDDM-2 with hyperglycemia, ESRD and PAD: A1c 5.0% (from 11.1% on 4/21).  Only takes Tradjenta at home. Recent Labs  Lab 12/07/20 1533 12/07/20 2120 12/08/20 0615 12/08/20 1112 12/08/20 1601  GLUCAP 112* 132* 101* 148* 192*  -Continue current SSI and  Tradjenta   ABLA superimposed on anemia of renal disease: Surgical blood loss? Recent Labs    11/26/20 0550 11/27/20 0202 11/28/20 0233 11/29/20 0102 11/30/20 0156 12/01/20 0115 12/02/20 0151 12/03/20 0127 12/05/20 0156 12/07/20 0150  HGB 8.1* 8.4* 7.9* 8.2* 7.1* 6.8* 8.5* 9.6* 9.0* 9.4*  -Transfused 1 unit on 6/23 with appropriate response. -IV iron and ESA per nephrology. -Monitor H&H  Thrombocytopenia: Resolved. Recent Labs  Lab 12/02/20 0151 12/03/20 0127 12/05/20 0156 12/07/20 0150  PLT 140* 155 169 196  -Continue monitoring   Moderate malnutrition Body mass index is 24.21 kg/m. Nutrition Problem: Moderate Malnutrition Etiology: chronic illness (PAD, ESRD on HD) Signs/Symptoms: mild fat depletion, moderate fat depletion, mild muscle depletion, moderate muscle depletion Interventions: MVI, Prostat, Magic cup, Glucerna shake  Pressure skin injury: POA Pressure Injury Coccyx Medial Stage 3 -  Full thickness tissue loss. Subcutaneous fat may be visible but bone, tendon or muscle are NOT exposed. 2 cm x 1 cm shallow bed with yellow/white (Active)     Location: Coccyx  Location Orientation: Medial  Staging: Stage 3 -  Full thickness tissue loss. Subcutaneous fat may be visible but bone, tendon or muscle are NOT exposed.  Wound Description (Comments): 2 cm x 1 cm shallow bed with yellow/white  Present on Admission: Yes     Pressure Injury 11/25/20 Thigh Right;Medial Stage 3 -  Full thickness tissue loss. Subcutaneous fat may be visible but bone, tendon or muscle are NOT exposed. (Active)  11/25/20 1945  Location: Thigh  Location Orientation: Right;Medial  Staging: Stage 3 -  Full thickness tissue loss. Subcutaneous fat may be visible but bone, tendon or muscle are NOT exposed.  Wound Description (Comments):   Present on Admission: Yes     Pressure Injury Flank Left Unstageable - Full thickness tissue loss in which the base of the injury is covered by slough  (yellow, tan, gray, green or brown) and/or eschar (tan, brown or black) in the wound bed. (Active)     Location: Flank  Location Orientation: Left  Staging: Unstageable - Full thickness tissue loss in which the base of the injury is covered by slough (yellow, tan, gray, green or brown) and/or eschar (tan, brown or black) in the wound bed.  Wound Description (Comments):   Present on Admission: Yes     Pressure Injury 11/28/20 Buttocks Left Unstageable - Full thickness tissue loss in which the base of the injury is covered by slough (yellow, tan, gray, green or brown) and/or eschar (tan, brown or black) in the wound bed. (Active)  11/28/20 0826  Location: Buttocks  Location Orientation: Left  Staging: Unstageable - Full thickness tissue loss in which the base of the injury is covered by slough (yellow, tan, gray, green or brown) and/or eschar (tan, brown or black) in the wound bed.  Wound Description (Comments):   Present on Admission:      Pressure Injury 11/28/20 Buttocks Right Unstageable - Full thickness tissue loss in which the base of the injury  is covered by slough (yellow, tan, gray, green or brown) and/or eschar (tan, brown or black) in the wound bed. (Active)  11/28/20 0826  Location: Buttocks  Location Orientation: Right  Staging: Unstageable - Full thickness tissue loss in which the base of the injury is covered by slough (yellow, tan, gray, green or brown) and/or eschar (tan, brown or black) in the wound bed.  Wound Description (Comments):   Present on Admission: Yes     Pressure Injury 11/28/20 Hip Left Unstageable - Full thickness tissue loss in which the base of the injury is covered by slough (yellow, tan, gray, green or brown) and/or eschar (tan, brown or black) in the wound bed. (Active)  11/28/20 0827  Location: Hip  Location Orientation: Left  Staging: Unstageable - Full thickness tissue loss in which the base of the injury is covered by slough (yellow, tan, gray, green  or brown) and/or eschar (tan, brown or black) in the wound bed.  Wound Description (Comments):   Present on Admission: Yes   DVT prophylaxis:   apixaban (ELIQUIS) tablet 5 mg  Code Status: Full code Family Communication: Patient and/or RN. Available if any question.  Level of care: Telemetry Medical Status is: Inpatient  Remains inpatient appropriate because:Unsafe d/c plan and Inpatient level of care appropriate due to severity of illness  Dispo: The patient is from: Home              Anticipated d/c is to: SNF              Patient currently is medically stable to d/c.     Difficult to place patient No       Consultants:  Nephrology Vascular surgery Palliative medicine   Sch Meds:  Scheduled Meds:  (feeding supplement) PROSource Plus  30 mL Oral TID BM   amiodarone  200 mg Oral Daily   apixaban  5 mg Oral BID   vitamin C  500 mg Oral BID   aspirin EC  81 mg Oral Daily   atorvastatin  20 mg Oral Daily   Chlorhexidine Gluconate Cloth  6 each Topical Q0600   darbepoetin (ARANESP) injection - DIALYSIS  200 mcg Intravenous Q Sat-HD   famotidine  20 mg Oral Daily   folic acid  1 mg Oral Daily   hydrocortisone  25 mg Rectal BID   insulin aspart  0-6 Units Subcutaneous TID WC   lidocaine  1 application Topical C1K   linagliptin  5 mg Oral Daily   multivitamin  1 tablet Oral QHS   sevelamer carbonate  800 mg Oral TID WC   Continuous Infusions:  sodium thiosulfate infusion for calciphylaxis Stopped (12/07/20 1404)   PRN Meds:.diphenhydrAMINE **AND** acetaminophen, acetaminophen, bisacodyl, diclofenac Sodium, docusate sodium, fluticasone, HYDROmorphone (DILAUDID) injection, HYDROmorphone, metoCLOPramide (REGLAN) injection, montelukast, ondansetron (ZOFRAN) IV  Antimicrobials: Anti-infectives (From admission, onward)    Start     Dose/Rate Route Frequency Ordered Stop   11/29/20 1800  ceFEPIme (MAXIPIME) 2 g in sodium chloride 0.9 % 100 mL IVPB        2 g 200 mL/hr  over 30 Minutes Intravenous  Once 11/29/20 0908 11/29/20 2046   11/26/20 1700  ceFEPIme (MAXIPIME) 1 g in sodium chloride 0.9 % 100 mL IVPB  Status:  Discontinued        1 g 200 mL/hr over 30 Minutes Intravenous Every 24 hours 11/25/20 1713 11/29/20 0908   11/26/20 1200  vancomycin (VANCOREADY) IVPB 750 mg/150 mL  Status:  Discontinued  750 mg 150 mL/hr over 60 Minutes Intravenous Every T-Th-Sa (Hemodialysis) 11/25/20 1714 11/29/20 0909   11/26/20 1126  vancomycin (VANCOREADY) 750 MG/150ML IVPB       Note to Pharmacy: Cherylann Banas   : cabinet override      11/26/20 1126 11/26/20 1200   11/26/20 1125  vancomycin (VANCOREADY) 750 MG/150ML IVPB       Note to Pharmacy: Cherylann Banas   : cabinet override      11/26/20 1125 11/26/20 1159   11/25/20 1700  vancomycin (VANCOREADY) IVPB 1250 mg/250 mL        1,250 mg 166.7 mL/hr over 90 Minutes Intravenous  Once 11/25/20 1645 11/26/20 0053   11/25/20 1645  ceFEPIme (MAXIPIME) 2 g in sodium chloride 0.9 % 100 mL IVPB        2 g 200 mL/hr over 30 Minutes Intravenous  Once 11/25/20 1644 11/25/20 2255   11/25/20 1500  clindamycin (CLEOCIN) IVPB 600 mg        600 mg 100 mL/hr over 30 Minutes Intravenous  Once 11/25/20 1459 11/25/20 1606        I have personally reviewed the following labs and images: CBC: Recent Labs  Lab 12/02/20 0151 12/03/20 0127 12/05/20 0156 12/07/20 0150  WBC 12.2* 12.4* 11.3* 10.5  NEUTROABS  --  9.0*  --   --   HGB 8.5* 9.6* 9.0* 9.4*  HCT 27.7* 30.8* 29.1* 30.3*  MCV 101.5* 101.3* 101.7* 103.1*  PLT 140* 155 169 196   BMP &GFR Recent Labs  Lab 12/02/20 0151 12/03/20 0127 12/05/20 0156 12/07/20 0150  NA 135 138 135 138  K 3.8 4.2 4.7 4.5  CL 99 104 99 102  CO2 27 28 27 27   GLUCOSE 116* 106* 133* 115*  BUN 20 38* 33* 30*  CREATININE 2.78* 4.38* 3.90* 3.80*  CALCIUM 7.9* 8.2* 8.3* 8.9  MG  --  2.2 2.4 2.5*  PHOS  --  4.1 4.1 4.8*   Estimated Creatinine Clearance: 11.9 mL/min (A) (by C-G formula  based on SCr of 3.8 mg/dL (H)). Liver & Pancreas: Recent Labs  Lab 12/03/20 0127 12/05/20 0156 12/07/20 0150  ALBUMIN 1.5* 1.7* 1.9*   No results for input(s): LIPASE, AMYLASE in the last 168 hours. No results for input(s): AMMONIA in the last 168 hours. Diabetic: Recent Labs    12/06/20 0507  HGBA1C 5.0   Recent Labs  Lab 12/07/20 1533 12/07/20 2120 12/08/20 0615 12/08/20 1112 12/08/20 1601  GLUCAP 112* 132* 101* 148* 192*   Cardiac Enzymes: No results for input(s): CKTOTAL, CKMB, CKMBINDEX, TROPONINI in the last 168 hours. No results for input(s): PROBNP in the last 8760 hours. Coagulation Profile: No results for input(s): INR, PROTIME in the last 168 hours.  Thyroid Function Tests: No results for input(s): TSH, T4TOTAL, FREET4, T3FREE, THYROIDAB in the last 72 hours. Lipid Profile: No results for input(s): CHOL, HDL, LDLCALC, TRIG, CHOLHDL, LDLDIRECT in the last 72 hours. Anemia Panel: No results for input(s): VITAMINB12, FOLATE, FERRITIN, TIBC, IRON, RETICCTPCT in the last 72 hours. Urine analysis:    Component Value Date/Time   COLORURINE AMBER (A) 09/30/2020 1035   APPEARANCEUR CLOUDY (A) 09/30/2020 1035   LABSPEC 1.012 09/30/2020 1035   PHURINE 8.0 09/30/2020 1035   GLUCOSEU NEGATIVE 09/30/2020 1035   HGBUR NEGATIVE 09/30/2020 1035   BILIRUBINUR NEGATIVE 09/30/2020 [place] 09/30/2020 1035   PROTEINUR 30 (A) 09/30/2020 1035   NITRITE NEGATIVE 09/30/2020 1035   LEUKOCYTESUR LARGE (A) 09/30/2020  1035   Sepsis Labs: Invalid input(s): PROCALCITONIN, [place]  Microbiology: No results found for this or any previous visit (from the past 240 hour(s)).   Radiology Studies: No results found.    Roper Tolson T. Jacona  If 7PM-7AM, please contact night-coverage [email or web address] 12/08/2020, 5:32 PM

## 2020-12-08 NOTE — Progress Notes (Signed)
[facility] Progress Note   Subjective:     Patient seen and examined at bedside. Seen sitting in recliner. No complaints at this time. Tolerated yesterday's HD with net UF 2.5L. Now on HD MWF d/t SNF placement. Plan for HD 7/1.  Objective Vitals:   12/07/20 2339 12/08/20 0539 12/08/20 0730 12/08/20 1115  BP: 130/66 (!) 143/56 (!) 121/54 (!) 98/52  Pulse: 72 70 70 80  Resp: 18 20 20 20   Temp: 98.3 F (36.8 C) 97.7 F (36.5 C) 97.7 F (36.5 C) 98.1 F (36.7 C)  TempSrc: Oral Oral Oral Oral  SpO2: 99% 98% 99% 96%  Weight:      Height:       Physical Exam General: Chronically ill appearing woman, NAD Heart: RRR; no murmur Lungs: CTA anteriorly Abdomen: soft, non-tender Extremities: no LE edema, R TMA site bandaged. All L toes with dry gangrene. R thigh bandaged. Dialysis Access: TDC in R chest, LUE AVF + bruit and sig hematoma  Filed Weights   12/07/20 0250 12/07/20 1115 12/07/20 1424  Weight: 63 kg 64.5 kg 62 kg    Intake/Output Summary (Last 24 hours) at 12/08/2020 1449 Last data filed at 12/08/2020 0500 Gross per 24 hour  Intake 440.07 ml  Output 0 ml  Net 440.07 ml    Additional Objective Labs: Basic Metabolic Panel: Recent Labs  Lab 12/03/20 0127 12/05/20 0156 12/07/20 0150  NA 138 135 138  K 4.2 4.7 4.5  CL 104 99 102  CO2 28 27 27   GLUCOSE 106* 133* 115*  BUN 38* 33* 30*  CREATININE 4.38* 3.90* 3.80*  CALCIUM 8.2* 8.3* 8.9  PHOS 4.1 4.1 4.8*   Liver Function Tests: Recent Labs  Lab 12/03/20 0127 12/05/20 0156 12/07/20 0150  ALBUMIN 1.5* 1.7* 1.9*   No results for input(s): LIPASE, AMYLASE in the last 168 hours. CBC: Recent Labs  Lab 12/02/20 0151 12/03/20 0127 12/05/20 0156 12/07/20 0150  WBC 12.2* 12.4* 11.3* 10.5  NEUTROABS  --  9.0*  --   --   HGB 8.5* 9.6* 9.0* 9.4*  HCT 27.7* 30.8* 29.1* 30.3*  MCV 101.5* 101.3* 101.7* 103.1*  PLT 140* 155 169 196   Blood Culture    Component Value Date/Time   SDES BLOOD  RIGHT HAND 11/25/2020 2301   SPECREQUEST  11/25/2020 2301    BOTTLES DRAWN AEROBIC ONLY Blood Culture results may not be optimal due to an inadequate volume of blood received in culture bottles   CULT  11/25/2020 2301    NO GROWTH 5 DAYS Performed at [facility], [place] [address] [place], [place] [address]    REPTSTATUS 12/01/2020 FINAL 11/25/2020 2301    Cardiac Enzymes: No results for input(s): CKTOTAL, CKMB, CKMBINDEX, TROPONINI in the last 168 hours. CBG: Recent Labs  Lab 12/07/20 0619 12/07/20 1533 12/07/20 2120 12/08/20 0615 12/08/20 1112  GLUCAP 111* 112* 132* 101* 148*   Iron Studies: No results for input(s): IRON, TIBC, TRANSFERRIN, FERRITIN in the last 72 hours. Lab Results  Component Value Date   INR 1.3 (H) 11/28/2020   INR 2.3 (H) 11/25/2020   INR 1.9 (H) 10/26/2020   Studies/Results: No results found.  Medications:  sodium thiosulfate infusion for calciphylaxis Stopped (12/07/20 1404)    (feeding supplement) PROSource Plus  30 mL Oral TID BM   amiodarone  200 mg Oral Daily   apixaban  5 mg Oral BID   vitamin C  500 mg Oral BID   aspirin EC  81 mg Oral Daily   atorvastatin  20 mg Oral Daily   Chlorhexidine Gluconate Cloth  6 each Topical Q0600   darbepoetin (ARANESP) injection - DIALYSIS  200 mcg Intravenous Q Sat-HD   famotidine  20 mg Oral Daily   folic acid  1 mg Oral Daily   hydrocortisone  25 mg Rectal BID   insulin aspart  0-6 Units Subcutaneous TID WC   lidocaine  1 application Topical F2B   linagliptin  5 mg Oral Daily   multivitamin  1 tablet Oral QHS   sevelamer carbonate  800 mg Oral TID WC    Dialysis Orders: Changing to MWF here for SNFP (was TTS [facility]) 3h 2min  250/ 500 (new cannulation protocol AVF) 59.5kg 3K/ 2.5 bath  AVF and TDC  Hep none - Venofer 100mg  x 5 ordered - Mircera 171mcg IV q 2 weeks (last 6/14)  Assessment/Plan: SP R TMA - on 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 Calciphylaxis wounds: B hip/sacrum. R  thigh wound debrided on 6/20. Plan to treat empirically with sodium thiosulfate and discontinue all Ca/Vit D/iron products.  ESRD: changing to MWF for rehab/ SNF purposes (usual HD is TTS). Use TDC while inpatient. Pt sitting up in chair x 4 hrs on 6/28 in her room. Next HD should be in a chair.   Hypertension/volume: Hypotensive on admit - home meds on hold. Stable BPs now.  UF as tolerated. No vol ^ on exam.   Anemia: Hgb now 9.4; transfuse as needed- got 1 unit 6/23. Aranesp 200 weekly. 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  Metabolic bone disease: calciphylaxis-  CorrCa ok, Phos was high-D/c'd Vit D3 binder changed to Renvela. Using 2Ca bath with dialysis as well as sodium thiosulfate tiw IV w/ HD. PO4 now at goal.  Nutrition:  Alb very low  CAD  PAD  T2DM  A-fib on Eliquis  GOC: Needs ongoing dialogue and will see how wounds progress. Awaiting SNF placement.   Tobie Poet, NP [facility] 12/08/2020,2:49 PM  LOS: 13 days

## 2020-12-08 NOTE — Progress Notes (Signed)
Pt. Was able to tolerate sitting in chair for 3.5 hours  Will continue to monitor

## 2020-12-09 LAB — RENAL FUNCTION PANEL
Albumin: 2.1 g/dL — ABNORMAL LOW (ref 3.5–5.0)
Anion gap: 15 (ref 5–15)
BUN: 34 mg/dL — ABNORMAL HIGH (ref 8–23)
CO2: 22 mmol/L (ref 22–32)
Calcium: 8.6 mg/dL — ABNORMAL LOW (ref 8.9–10.3)
Chloride: 97 mmol/L — ABNORMAL LOW (ref 98–111)
Creatinine, Ser: 4 mg/dL — ABNORMAL HIGH (ref 0.44–1.00)
GFR, Estimated: 12 mL/min — ABNORMAL LOW (ref >60)
Glucose, Bld: 156 mg/dL — ABNORMAL HIGH (ref 70–99)
Phosphorus: 4.3 mg/dL (ref 2.5–4.6)
Potassium: 3.6 mmol/L (ref 3.5–5.1)
Sodium: 134 mmol/L — ABNORMAL LOW (ref 135–145)

## 2020-12-09 LAB — CBC
HCT: 32 % — ABNORMAL LOW (ref 36.0–46.0)
Hemoglobin: 10 g/dL — ABNORMAL LOW (ref 12.0–15.0)
MCH: 31.5 pg (ref 26.0–34.0)
MCHC: 31.3 g/dL (ref 30.0–36.0)
MCV: 100.9 fL — ABNORMAL HIGH (ref 80.0–100.0)
Platelets: 242 10*3/uL (ref 150–400)
RBC: 3.17 MIL/uL — ABNORMAL LOW (ref 3.87–5.11)
RDW: 20.5 % — ABNORMAL HIGH (ref 11.5–15.5)
WBC: 6.5 10*3/uL (ref 4.0–10.5)
nRBC: 0 % (ref 0.0–0.2)

## 2020-12-09 LAB — GLUCOSE, CAPILLARY
Glucose-Capillary: 122 mg/dL — ABNORMAL HIGH (ref 70–99)
Glucose-Capillary: 148 mg/dL — ABNORMAL HIGH (ref 70–99)
Glucose-Capillary: 108 mg/dL — ABNORMAL HIGH (ref 70–99)
Glucose-Capillary: 170 mg/dL — ABNORMAL HIGH (ref 70–99)

## 2020-12-09 LAB — HEPATITIS B SURFACE ANTIGEN: Hepatitis B Surface Ag: NONREACTIVE

## 2020-12-09 MED ORDER — ALBUMIN HUMAN 25 % IV SOLN
INTRAVENOUS | Status: AC
  Administered 2020-12-09: 25 g via INTRAVENOUS
  Filled 2020-12-09: qty 100

## 2020-12-09 MED ORDER — HEPARIN SODIUM (PORCINE) 1000 UNIT/ML DIALYSIS: 1000.0000 [IU] | INTRAMUSCULAR | Status: DC | PRN

## 2020-12-09 MED ORDER — CHLORHEXIDINE GLUCONATE CLOTH 2 % EX PADS
6.0000 | MEDICATED_PAD | Freq: Every day | CUTANEOUS | Status: DC
  Administered 2020-12-10 – 2020-12-13 (×3): 6 via TOPICAL

## 2020-12-09 MED ORDER — ALTEPLASE 2 MG IJ SOLR: 2.0000 mg | Freq: Once | INTRAMUSCULAR | Status: DC | PRN

## 2020-12-09 MED ORDER — SODIUM CHLORIDE 0.9 % IV SOLN: 100.0000 mL | INTRAVENOUS | Status: DC | PRN

## 2020-12-09 MED ORDER — LIDOCAINE HCL (PF) 1 % IJ SOLN: 5.0000 mL | INTRAMUSCULAR | Status: DC | PRN

## 2020-12-09 MED ORDER — LIDOCAINE-PRILOCAINE 2.5-2.5 % EX CREA: 1.0000 "application " | TOPICAL_CREAM | CUTANEOUS | Status: DC | PRN

## 2020-12-09 MED ORDER — PENTAFLUOROPROP-TETRAFLUOROETH EX AERO: 1.0000 "application " | INHALATION_SPRAY | CUTANEOUS | Status: DC | PRN

## 2020-12-09 MED ORDER — ALBUMIN HUMAN 25 % IV SOLN: 25.0000 g | Freq: Once | INTRAVENOUS | Status: AC

## 2020-12-09 NOTE — Progress Notes (Signed)
PT Cancellation Note  Patient Details Name: Laverda Stribling MRN: [id number] DOB: 1950-07-13   Cancelled Treatment:    Reason Eval/Treat Not Completed: Patient at procedure or test/unavailable (HD). Will follow-up for PT treatment as schedule permits.  Mabeline Caras, PT, DPT Acute Rehabilitation Services  Pager [phone] Office [place] 12/09/2020, 2:29 PM

## 2020-12-09 NOTE — Progress Notes (Signed)
PROGRESS NOTE  Kathryn Hancock JSH:[id number] DOB: [birth year]   PCP: Gala Lewandowsky, MD  Patient is from: Home  DOA: 11/25/2020 LOS: 27  Chief complaints:  Chief Complaint  Patient presents with   Wound Infection     Brief Narrative / Interim history: 70 year old F with PMH of ESRD on HD TTS, PAF on Eliquis, IDDM-2, CAD/CABG, PAD with b/l bypass, unhealing right thigh wound after right femoropopliteal bypass, HTN and HLD presenting with worsening gangrenous changes of all bilateral toes and nonhealing wound of right thigh and bilateral hips.  Vascular surgery consulted.  Patient underwent right TMA and excisional debridement of the right thigh with Dr. Scot Dock on on 6/20.   Plan for discharge to SNF once she tolerates HD in chair which seems to be challenging given her extensive wound.  Palliative medicine consulted.  She remains full code with full scope of care.  Subjective: Seen and examined earlier this afternoon.  Just moved to bedside chair getting ready to go to dialysis.  She is planning to try dialysis in chair today.  Felt nauseous after she got on chair.  No other complaints.  She denies chest pain, abdominal pain or emesis.  Objective: Vitals:   12/09/20 1430 12/09/20 1500 12/09/20 1530 12/09/20 1600  BP: (!) 103/57 (!) 98/56 (!) 85/50 (!) 108/49  Pulse: 71 73  74  Resp: 12 13 13 12   Temp:      TempSrc:      SpO2:      Weight:      Height:        Intake/Output Summary (Last 24 hours) at 12/09/2020 1659 Last data filed at 12/09/2020 0354 Gross per 24 hour  Intake 240 ml  Output --  Net 240 ml    Filed Weights   12/07/20 0250 12/07/20 1115 12/07/20 1424  Weight: 63 kg 64.5 kg 62 kg    Examination:   GENERAL: No apparent distress.  Nontoxic. HEENT: MMM.  Vision and hearing grossly intact.  NECK: Supple.  No apparent JVD.  RESP:  No IWOB.  Fair aeration bilaterally. CVS:  RRR. Heart sounds normal.  ABD/GI/GU: BS+. Abd soft, NTND.  MSK/EXT:  Moves  extremities. No apparent deformity.  Trace edema in the right shin.  Dry gangrene in left toes. SKIN: Dressing over right TMA and right thigh DCI. Sacral and hip wounds. NEURO: Awake and alert. Oriented appropriately.  No apparent focal neuro deficit. PSYCH: Calm. Normal affect.   Procedures:  6/20-right TMA and excisional debridement of right thigh by Dr. Scot Dock  Microbiology summarized: COVID-19 and influenza PCR nonreactive. Blood cultures NGTD.   Assessment & Plan: Nonhealing ulcer of the right medial thigh at the site of saphenectomy incision Bilateral PAD with bilateral gangrenous changes in toes Extensive pressure skin injury of bilateral buttocks and left hip as below -S/p right TMA and excisional debridement by vascular surgery on 6/20 -VVS recs: daily dressing for right TMA wound and BID WTD dressing for right thigh wound -Outpatient follow-up with Dr. Scot Dock in 2 weeks -Continue statin and aspirin. -Sepsis ruled out.  SIRS resolved.  Blood cultures negative. -Appreciate WOCN recs. -Appreciate input by palliative care.  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 -On sodium thiosulfate with HD per nephrology -Continue local wound care per WOCN recommendation  Nausea and vomiting: A little nauseous but no emesis.  Could be due to opiates. -Adjusted opiates. -Continue as needed antiemetics   ESRD on HD changed from TTS to MWF -  HD per nephrology.  Needs to tolerate HD in chair for 4 hours.  Sat in chair for 4 hours on 6/28.  History of CAD/CABG: Stable. -Continue home meds  Paroxysmal atrial fibrillation: Rate controlled. -Continue amiodarone and Eliquis  Uncontrolled NIDDM-2 with hyperglycemia, ESRD and PAD: A1c 5.0% (from 11.1% on 4/21).  Only takes Tradjenta at home. Recent Labs  Lab 12/08/20 1112 12/08/20 1601 12/08/20 2044 12/09/20 0600 12/09/20 1133  GLUCAP 148* 192* 145* 122* 148*  -Continue current SSI and Tradjenta   ABLA  superimposed on anemia of renal disease: Surgical blood loss?  H&H stable. Recent Labs    11/27/20 0202 11/28/20 0233 11/29/20 0102 11/30/20 0156 12/01/20 0115 12/02/20 0151 12/03/20 0127 12/05/20 0156 12/07/20 0150 12/09/20 1431  HGB 8.4* 7.9* 8.2* 7.1* 6.8* 8.5* 9.6* 9.0* 9.4* 10.0*  -Transfused 1 unit on 6/23 with appropriate response. -IV iron and ESA per nephrology. -Monitor H&H  Thrombocytopenia: Resolved. Recent Labs  Lab 12/03/20 0127 12/05/20 0156 12/07/20 0150 12/09/20 1431  PLT 155 169 196 242  -Continue monitoring   Moderate malnutrition Body mass index is 24.21 kg/m. Nutrition Problem: Moderate Malnutrition Etiology: chronic illness (PAD, ESRD on HD) Signs/Symptoms: mild fat depletion, moderate fat depletion, mild muscle depletion, moderate muscle depletion Interventions: MVI, Prostat, Magic cup, Glucerna shake  Pressure skin injury: POA Pressure Injury Coccyx Medial Stage 3 -  Full thickness tissue loss. Subcutaneous fat may be visible but bone, tendon or muscle are NOT exposed. 2 cm x 1 cm shallow bed with yellow/white (Active)     Location: Coccyx  Location Orientation: Medial  Staging: Stage 3 -  Full thickness tissue loss. Subcutaneous fat may be visible but bone, tendon or muscle are NOT exposed.  Wound Description (Comments): 2 cm x 1 cm shallow bed with yellow/white  Present on Admission: Yes     Pressure Injury 11/25/20 Thigh Right;Medial Stage 3 -  Full thickness tissue loss. Subcutaneous fat may be visible but bone, tendon or muscle are NOT exposed. (Active)  11/25/20 1945  Location: Thigh  Location Orientation: Right;Medial  Staging: Stage 3 -  Full thickness tissue loss. Subcutaneous fat may be visible but bone, tendon or muscle are NOT exposed.  Wound Description (Comments):   Present on Admission: Yes     Pressure Injury Flank Left Unstageable - Full thickness tissue loss in which the base of the injury is covered by slough (yellow,  tan, gray, green or brown) and/or eschar (tan, brown or black) in the wound bed. (Active)     Location: Flank  Location Orientation: Left  Staging: Unstageable - Full thickness tissue loss in which the base of the injury is covered by slough (yellow, tan, gray, green or brown) and/or eschar (tan, brown or black) in the wound bed.  Wound Description (Comments):   Present on Admission: Yes     Pressure Injury 11/28/20 Buttocks Left Unstageable - Full thickness tissue loss in which the base of the injury is covered by slough (yellow, tan, gray, green or brown) and/or eschar (tan, brown or black) in the wound bed. (Active)  11/28/20 0826  Location: Buttocks  Location Orientation: Left  Staging: Unstageable - Full thickness tissue loss in which the base of the injury is covered by slough (yellow, tan, gray, green or brown) and/or eschar (tan, brown or black) in the wound bed.  Wound Description (Comments):   Present on Admission:      Pressure Injury 11/28/20 Buttocks Right Unstageable - Full thickness tissue loss in which  the base of the injury is covered by slough (yellow, tan, gray, green or brown) and/or eschar (tan, brown or black) in the wound bed. (Active)  11/28/20 0826  Location: Buttocks  Location Orientation: Right  Staging: Unstageable - Full thickness tissue loss in which the base of the injury is covered by slough (yellow, tan, gray, green or brown) and/or eschar (tan, brown or black) in the wound bed.  Wound Description (Comments):   Present on Admission: Yes     Pressure Injury 11/28/20 Hip Left Unstageable - Full thickness tissue loss in which the base of the injury is covered by slough (yellow, tan, gray, green or brown) and/or eschar (tan, brown or black) in the wound bed. (Active)  11/28/20 0827  Location: Hip  Location Orientation: Left  Staging: Unstageable - Full thickness tissue loss in which the base of the injury is covered by slough (yellow, tan, gray, green or  brown) and/or eschar (tan, brown or black) in the wound bed.  Wound Description (Comments):   Present on Admission: Yes   DVT prophylaxis:   apixaban (ELIQUIS) tablet 5 mg  Code Status: Full code Family Communication: Patient and/or RN. Available if any question.  Level of care: Telemetry Medical Status is: Inpatient  Remains inpatient appropriate because:Unsafe d/c plan and Inpatient level of care appropriate due to severity of illness  Dispo: The patient is from: Home              Anticipated d/c is to: SNF              Patient currently is medically stable to d/c.     Difficult to place patient No       Consultants:  Nephrology Vascular surgery Palliative medicine   Sch Meds:  Scheduled Meds:  (feeding supplement) PROSource Plus  30 mL Oral TID BM   amiodarone  200 mg Oral Daily   apixaban  5 mg Oral BID   vitamin C  500 mg Oral BID   aspirin EC  81 mg Oral Daily   atorvastatin  20 mg Oral Daily   Chlorhexidine Gluconate Cloth  6 each Topical Q0600   [START ON 12/10/2020] Chlorhexidine Gluconate Cloth  6 each Topical Q0600   darbepoetin (ARANESP) injection - DIALYSIS  200 mcg Intravenous Q Sat-HD   famotidine  20 mg Oral Daily   folic acid  1 mg Oral Daily   hydrocortisone  25 mg Rectal BID   insulin aspart  0-6 Units Subcutaneous TID WC   lidocaine  1 application Topical P8E   linagliptin  5 mg Oral Daily   multivitamin  1 tablet Oral QHS   sevelamer carbonate  800 mg Oral TID WC   Continuous Infusions:  sodium chloride     sodium chloride     sodium thiosulfate infusion for calciphylaxis Stopped (12/07/20 1404)   PRN Meds:.sodium chloride, sodium chloride, diphenhydrAMINE **AND** acetaminophen, acetaminophen, alteplase, bisacodyl, diclofenac Sodium, docusate sodium, fluticasone, heparin, HYDROmorphone (DILAUDID) injection, HYDROmorphone, lidocaine (PF), lidocaine-prilocaine, metoCLOPramide (REGLAN) injection, montelukast, ondansetron (ZOFRAN) IV,  pentafluoroprop-tetrafluoroeth  Antimicrobials: Anti-infectives (From admission, onward)    Start     Dose/Rate Route Frequency Ordered Stop   11/29/20 1800  ceFEPIme (MAXIPIME) 2 g in sodium chloride 0.9 % 100 mL IVPB        2 g 200 mL/hr over 30 Minutes Intravenous  Once 11/29/20 0908 11/29/20 2046   11/26/20 1700  ceFEPIme (MAXIPIME) 1 g in sodium chloride 0.9 % 100 mL IVPB  Status:  Discontinued        1 g 200 mL/hr over 30 Minutes Intravenous Every 24 hours 11/25/20 1713 11/29/20 0908   11/26/20 1200  vancomycin (VANCOREADY) IVPB 750 mg/150 mL  Status:  Discontinued        750 mg 150 mL/hr over 60 Minutes Intravenous Every T-Th-Sa (Hemodialysis) 11/25/20 1714 11/29/20 0909   11/26/20 1126  vancomycin (VANCOREADY) 750 MG/150ML IVPB       Note to Pharmacy: Cherylann Banas   : cabinet override      11/26/20 1126 11/26/20 1200   11/26/20 1125  vancomycin (VANCOREADY) 750 MG/150ML IVPB       Note to Pharmacy: Cherylann Banas   : cabinet override      11/26/20 1125 11/26/20 1159   11/25/20 1700  vancomycin (VANCOREADY) IVPB 1250 mg/250 mL        1,250 mg 166.7 mL/hr over 90 Minutes Intravenous  Once 11/25/20 1645 11/26/20 0053   11/25/20 1645  ceFEPIme (MAXIPIME) 2 g in sodium chloride 0.9 % 100 mL IVPB        2 g 200 mL/hr over 30 Minutes Intravenous  Once 11/25/20 1644 11/25/20 2255   11/25/20 1500  clindamycin (CLEOCIN) IVPB 600 mg        600 mg 100 mL/hr over 30 Minutes Intravenous  Once 11/25/20 1459 11/25/20 1606        I have personally reviewed the following labs and images: CBC: Recent Labs  Lab 12/03/20 0127 12/05/20 0156 12/07/20 0150 12/09/20 1431  WBC 12.4* 11.3* 10.5 6.5  NEUTROABS 9.0*  --   --   --   HGB 9.6* 9.0* 9.4* 10.0*  HCT 30.8* 29.1* 30.3* 32.0*  MCV 101.3* 101.7* 103.1* 100.9*  PLT 155 169 196 242   BMP &GFR Recent Labs  Lab 12/03/20 0127 12/05/20 0156 12/07/20 0150 12/09/20 1431  NA 138 135 138 134*  K 4.2 4.7 4.5 3.6  CL 104 99 102 97*   CO2 28 27 27 22   GLUCOSE 106* 133* 115* 156*  BUN 38* 33* 30* 34*  CREATININE 4.38* 3.90* 3.80* 4.00*  CALCIUM 8.2* 8.3* 8.9 8.6*  MG 2.2 2.4 2.5*  --   PHOS 4.1 4.1 4.8* 4.3   Estimated Creatinine Clearance: 11.3 mL/min (A) (by C-G formula based on SCr of 4 mg/dL (H)). Liver & Pancreas: Recent Labs  Lab 12/03/20 0127 12/05/20 0156 12/07/20 0150 12/09/20 1431  ALBUMIN 1.5* 1.7* 1.9* 2.1*   No results for input(s): LIPASE, AMYLASE in the last 168 hours. No results for input(s): AMMONIA in the last 168 hours. Diabetic: No results for input(s): HGBA1C in the last 72 hours.  Recent Labs  Lab 12/08/20 1112 12/08/20 1601 12/08/20 2044 12/09/20 0600 12/09/20 1133  GLUCAP 148* 192* 145* 122* 148*   Cardiac Enzymes: No results for input(s): CKTOTAL, CKMB, CKMBINDEX, TROPONINI in the last 168 hours. No results for input(s): PROBNP in the last 8760 hours. Coagulation Profile: No results for input(s): INR, PROTIME in the last 168 hours.  Thyroid Function Tests: No results for input(s): TSH, T4TOTAL, FREET4, T3FREE, THYROIDAB in the last 72 hours. Lipid Profile: No results for input(s): CHOL, HDL, LDLCALC, TRIG, CHOLHDL, LDLDIRECT in the last 72 hours. Anemia Panel: No results for input(s): VITAMINB12, FOLATE, FERRITIN, TIBC, IRON, RETICCTPCT in the last 72 hours. Urine analysis:    Component Value Date/Time   COLORURINE AMBER (A) 09/30/2020 1035   APPEARANCEUR CLOUDY (A) 09/30/2020 1035   LABSPEC 1.012 09/30/2020 1035   PHURINE 8.0  09/30/2020 [place] 09/30/2020 [place] 09/30/2020 [place] 09/30/2020 1035   [place] 09/30/2020 1035   PROTEINUR 30 (A) 09/30/2020 1035   NITRITE NEGATIVE 09/30/2020 1035   LEUKOCYTESUR LARGE (A) 09/30/2020 1035   Sepsis Labs: Invalid input(s): PROCALCITONIN, [place]  Microbiology: No results found for this or any previous visit (from the past 240 hour(s)).   Radiology  Studies: No results found.    [place] [address] [place]  If 7PM-7AM, please contact night-coverage [email or web address] 12/09/2020, 4:59 PM

## 2020-12-09 NOTE — Progress Notes (Signed)
Daily Progress Note   Patient Name: Kathryn Hancock       Date: 12/09/2020 DOB: 30-Apr-1951  Age: 70 y.o. MRN#: [id number] Attending Physician: Mercy Riding, MD Primary Care Physician: Gala Lewandowsky, MD Admit Date: 11/25/2020  Reason for Consultation/Follow-up: Establishing goals of care, Non pain symptom management, Pain control, and Psychosocial/spiritual support   To discuss complex medical decision making related to patient's goals of care  Patient examined. Chart Reviewed.   Subjective: Kathryn Hancock is a 70 y.o. female who is awake, alert, and oriented.  PMT discussed how important it is to have open and honest conversations regarding Kathryn Hancock's goals of care for her hospital stay to ensure everyone is of the same understand. When asked about her pain and discomfort, Kathryn Hancock reports that her "bottom" dress changes feel like she is being "skinned alive." She reports they are still using the Lidocaine on the area; however, she states it only helps a little. She reports she does not want to make any adjustments to her pain medications for fear she will become dependent. Kathryn Hancock became tearful after that statement, expressing that her son passed away from and OD of pain medication, and she felt as though she "failed him" as a mother. PMT provided support and reassurance. Kathryn Hancock reports she has been having occasional nausea; however, she reports she manages it by sucking on Life savers and doesn't need nausea medication at this time.   Kathryn Hancock daughter was not at the bedside for follow-up. Kathryn Hancock reports she is having a difficult time with everything. She states she is emotional, yet extremely strong. PMT inquired about the MOST form she was attempting to complete. Kathryn Hancock reports "Kathryn Hancock"  and her daughter are still reviewing it and will bring it back "when they decide what's best" for the patient since she feels she "cannot make clear decisions while on pain medication." Toward the end of the conversation, Kathryn Hancock brought up again that is is imparative her house payment be made on time. The house has been passed down for the past 3 generations, and she fears she will lose the house if she goes to SNF with outpatient HD.   Updated daughter.  Daughter is supportive of her mother continuing fight and will be supportive of her taking pain medications to improve  her functionality.  Assessment: 70 y.o. female resting in bed. Patient with significant limb ischemia secondary to calciphylaxis from hemodialysis. She is awake, alert, and oriented in no acute distress. No noticeable work of breathing or signs of discomfort. Patient became visibly tearful when discussing family.    Patient Profile/HPI: 70 y.o. female  with past medical history of significant PAD w/ status post left and right leg bypasses, bilateral toe ischemia, unhealed wound to right thigh post right femoropopliteal bypass, ESRD requiring HD since January 2022, PAF, IDDM, HTN, CAP status post CABG, and HLD who was admitted on 11/25/2020 with severe wound pain and worsening gangrene on bilateral toes, right thigh wounds, and bilateral ulcers on hips. Patient has had 4 previous admissions in the last 6 months all related to PAD and gangrenous ischemia. Patient underwent right TMA and excisional debridement of right thigh on 6/20. Palliative medicine was consulted for symptom management and to aid in complex medical decision making moving forward.    Length of Stay: 14   Vital Signs: BP (!) 146/62 (BP Location: Right Arm)   Pulse 74   Temp 98.4 F (36.9 C) (Oral)   Resp 18   Ht 5\' 3"  (1.6 m)   Wt 62 kg   SpO2 100%   BMI 24.21 kg/m  SpO2: SpO2: 100 % O2 Device: O2 Device: Room Air O2 Flow Rate: O2 Flow Rate (L/min): 0  L/min       Palliative Assessment/Data: 30% PPS      Palliative Care Plan    Recommendations/Plan: FULL Code/FULL Scope - Patient states daughter and friend Kathryn Hancock still need time to think it over  Continue with full scope treatment Continue with current pain management to ensure comfort.  Chaplain follow-up requested per patient as much as possible  Patient requests Outpatient Palliative to follow   Code Status:  Full code  Prognosis: Likely < 6 months with continued hemodialysis due to severe pain from progressive calciphylaxis   Discharge Planning: [place] for rehab with Palliative care service follow-up  Care plan was discussed with Patient.  Called daughter on the phone.  She supports her mother in "continuing to fight".  Thank you for allowing the Palliative Medicine Team to assist in the care of this patient.  Total time spent:  35 min.     Greater than 50%  of this time was spent counseling and coordinating care related to the above assessment and plan.  [name], PA-C Palliative Medicine Demetrios Isaacs, PA-S2   Please contact Palliative MedicineTeam phone at [phone] for questions and concerns between 7 am - 7 pm.   Please see AMION for individual provider pager numbers.

## 2020-12-09 NOTE — Progress Notes (Signed)
[facility] Progress Note   Subjective:   Patient seen and examined at bedside.  Feeling ok today.  Just wants to know what will be the plan for rehab.  Notified SW she wanted to discuss with her.  Denies pain, SOB, palpitations and n/v/d.  Objective Vitals:   12/08/20 1952 12/08/20 2315 12/09/20 0346 12/09/20 0908  BP: (!) 112/55 (!) 126/54 (!) 144/65 (!) 124/57  Pulse: 72 69 79 72  Resp: 16 17 16 18   Temp: 97.8 F (36.6 C) 98 F (36.7 C) 98.1 F (36.7 C) 97.8 F (36.6 C)  TempSrc: Oral Oral Oral Oral  SpO2: 99% 100% 99% 100%  Weight:      Height:       Physical Exam General:chroncially ill appearing female in NAD Heart:RRR, no mrg Lungs:CTAB, nml WOB Abdomen:soft, NTND Extremities:1+ LE edema on L with dry gangrene on all toes, 2+ edema on R with dressing in place Dialysis Access: [facility] R chest, LUE AVF +b/t, hematoma   Filed Weights   12/07/20 0250 12/07/20 1115 12/07/20 1424  Weight: 63 kg 64.5 kg 62 kg    Intake/Output Summary (Last 24 hours) at 12/09/2020 1103 Last data filed at 12/09/2020 0354 Gross per 24 hour  Intake 240 ml  Output --  Net 240 ml    Additional Objective Labs: Basic Metabolic Panel: Recent Labs  Lab 12/03/20 0127 12/05/20 0156 12/07/20 0150  NA 138 135 138  K 4.2 4.7 4.5  CL 104 99 102  CO2 28 27 27   GLUCOSE 106* 133* 115*  BUN 38* 33* 30*  CREATININE 4.38* 3.90* 3.80*  CALCIUM 8.2* 8.3* 8.9  PHOS 4.1 4.1 4.8*   Liver Function Tests: Recent Labs  Lab 12/03/20 0127 12/05/20 0156 12/07/20 0150  ALBUMIN 1.5* 1.7* 1.9*  CBC: Recent Labs  Lab 12/03/20 0127 12/05/20 0156 12/07/20 0150  WBC 12.4* 11.3* 10.5  NEUTROABS 9.0*  --   --   HGB 9.6* 9.0* 9.4*  HCT 30.8* 29.1* 30.3*  MCV 101.3* 101.7* 103.1*  PLT 155 169 196   Blood Culture    Component Value Date/Time   SDES BLOOD RIGHT HAND 11/25/2020 2301   SPECREQUEST  11/25/2020 2301    BOTTLES DRAWN AEROBIC ONLY Blood Culture results may not be optimal due  to an inadequate volume of blood received in culture bottles   CULT  11/25/2020 2301    NO GROWTH 5 DAYS Performed at [facility], [address], [place], [place] [address]    REPTSTATUS 12/01/2020 FINAL 11/25/2020 2301   CBG: Recent Labs  Lab 12/08/20 0615 12/08/20 1112 12/08/20 1601 12/08/20 2044 12/09/20 0600  GLUCAP 101* 148* 192* 145* 122*    Medications:  sodium thiosulfate infusion for calciphylaxis Stopped (12/07/20 1404)    (feeding supplement) PROSource Plus  30 mL Oral TID BM   amiodarone  200 mg Oral Daily   apixaban  5 mg Oral BID   vitamin C  500 mg Oral BID   aspirin EC  81 mg Oral Daily   atorvastatin  20 mg Oral Daily   Chlorhexidine Gluconate Cloth  6 each Topical Q0600   darbepoetin (ARANESP) injection - DIALYSIS  200 mcg Intravenous Q Sat-HD   famotidine  20 mg Oral Daily   folic acid  1 mg Oral Daily   hydrocortisone  25 mg Rectal BID   insulin aspart  0-6 Units Subcutaneous TID WC   lidocaine  1 application Topical L8X   linagliptin  5  mg Oral Daily   multivitamin  1 tablet Oral QHS   sevelamer carbonate  800 mg Oral TID WC    Dialysis Orders: Changing to MWF here for SNFP (was TTS [place]) 3h 42min  250/ 500 (new cannulation protocol AVF) 59.5kg 3K/ 2.5 bath  AVF and TDC  Hep none - Venofer 100mg  x 5 ordered - Mircera 160mcg IV q 2 weeks (last 6/14)   Assessment/Plan: SP R TMA - on 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 Calciphylaxis wounds: B hip/sacrum. R thigh wound debrided on 6/20. Plan to treat empirically with sodium thiosulfate and discontinue all Ca/Vit D/iron products.  ESRD: changing to MWF for rehab/ SNF purposes (usual HD is TTS). Use TDC while inpatient to rest AVF. Pt sitting up in chair x 4 hrs on 6/28 in her room. Plan for HD today in chair.   Hypertension/volume: Hypotensive on admit - home meds on hold. BP now well controlled.  Lower extremity edema on exam.  Plan for UF 2.5-3L as tolerated.   Anemia: Hgb ^9.4; 1unit  pRBC on 6/23.  Aranesp 200 weekly. No IV iron in setting of infection.  Metabolic bone disease: calciphylaxis-  CorrCa ok, Vit D d/c. Phos in goal. Changed from calcium based binder, now on Renvela. Using 2Ca bath with dialysis as well as sodium thiosulfate tiw IV w/ HD. PO4 now at goal.  Nutrition:  Alb very low. Renal diet with fluid restrictions.   CAD  PAD  T2DM  A-fib on Eliquis  GOC: Needs ongoing dialogue and will see how wounds progress. Awaiting SNF placement.   Jen Mow, PA-C [facility] 12/09/2020,11:03 AM  LOS: 14 days

## 2020-12-09 NOTE — Progress Notes (Signed)
This chaplain is present with the Pt. for F/U spiritual care.  The Pt. is anticipating HD in the chair this afternoon with the hope she will be able to withstand sitting for four hours. The chaplain understands the Pt.'s hope is connected to the Pt goal of getting closer to rehab and then home with family.  The Pt. is smiling as she reflects on hospital visits with family this week. The chaplain listens as the Pt. tells a story about each person.  The Pt. faith continues to thread through her storytelling.  The Pt. accepts the chaplain's invitation for prayer and for F/U spiritual care as needed.

## 2020-12-10 LAB — GLUCOSE, CAPILLARY
Glucose-Capillary: 100 mg/dL — ABNORMAL HIGH (ref 70–99)
Glucose-Capillary: 178 mg/dL — ABNORMAL HIGH (ref 70–99)
Glucose-Capillary: 89 mg/dL (ref 70–99)
Glucose-Capillary: 182 mg/dL — ABNORMAL HIGH (ref 70–99)

## 2020-12-10 MED ORDER — PROSOURCE PLUS PO LIQD
30.0000 mL | Freq: Three times a day (TID) | ORAL | Status: AC
Start: 2020-12-10 — End: ?

## 2020-12-10 MED ORDER — RENA-VITE PO TABS: 1.0000 | ORAL_TABLET | Freq: Every day | ORAL | 0 refills | Status: AC

## 2020-12-10 MED ORDER — HYDROMORPHONE HCL 4 MG PO TABS: 4.0000 mg | ORAL_TABLET | ORAL | 0 refills | Status: DC | PRN

## 2020-12-10 MED ORDER — DARBEPOETIN ALFA 200 MCG/0.4ML IJ SOSY
200.0000 ug | PREFILLED_SYRINGE | INTRAMUSCULAR | Status: DC
  Administered 2020-12-12: 200 ug via INTRAVENOUS
  Filled 2020-12-10: qty 0.4

## 2020-12-10 MED ORDER — ONDANSETRON HCL 4 MG PO TABS: 4.0000 mg | ORAL_TABLET | Freq: Every day | ORAL | 0 refills | Status: AC | PRN

## 2020-12-10 MED ORDER — SEVELAMER CARBONATE 800 MG PO TABS: 800.0000 mg | ORAL_TABLET | Freq: Three times a day (TID) | ORAL | Status: AC

## 2020-12-10 MED ORDER — INSULIN ASPART 100 UNIT/ML IJ SOLN: 0.0000 [IU] | Freq: Three times a day (TID) | INTRAMUSCULAR | 11 refills | Status: DC

## 2020-12-10 MED ORDER — TRAZODONE HCL 50 MG PO TABS: 50.0000 mg | ORAL_TABLET | Freq: Every evening | ORAL | Status: AC | PRN

## 2020-12-10 MED ORDER — HYDROCORTISONE ACETATE 25 MG RE SUPP: 25.0000 mg | Freq: Two times a day (BID) | RECTAL | 0 refills | Status: AC

## 2020-12-10 MED ORDER — DARBEPOETIN ALFA 200 MCG/0.4ML IJ SOSY: 200.0000 ug | PREFILLED_SYRINGE | INTRAMUSCULAR | Status: AC

## 2020-12-10 MED ORDER — BISACODYL 5 MG PO TBEC
5.0000 mg | DELAYED_RELEASE_TABLET | Freq: Every day | ORAL | 0 refills | Status: AC | PRN
Start: 2020-12-10 — End: ?

## 2020-12-10 MED ORDER — METOCLOPRAMIDE HCL 5 MG PO TABS: 5.0000 mg | ORAL_TABLET | Freq: Three times a day (TID) | ORAL | 1 refills | Status: AC | PRN

## 2020-12-10 NOTE — TOC Progression Note (Addendum)
Transition of Care [facility]) - Progression Note    Patient Details  Name: Kathryn Hancock MRN: [id number] Date of Birth: 04-May-1951  Transition of Care [facility]) CM/SW [place], [place] Phone Number: [phone]  12/10/2020, 3:35 PM  Clinical Narrative:     CSW spoke with Mel Almond at [facility] and she stated that facility will not have a bed for pt until either Monday or Tuesday. CSW alerted MD. Facility also stated that COVID test is not needed.  TOC team will continue to assist with discharge planning needs.   Expected Discharge Plan: Skilled Nursing Facility Barriers to Discharge: Continued Medical Work up, SNF Pending bed offer  Expected Discharge Plan and Services Expected Discharge Plan: [place] In-house Referral: Clinical Social Work       Expected Discharge Date: 12/10/20                                     Social Determinants of Health (SDOH) Interventions    Readmission Risk Interventions Readmission Risk Prevention Plan 11/02/2020  Transportation Screening Complete  Medication Review Press photographer) Complete  HRI or [place] Complete  SW Recovery Care/Counseling Consult Complete  Palliative Care Screening Not [place] Not Applicable

## 2020-12-10 NOTE — Progress Notes (Addendum)
PROGRESS NOTE  Kathryn Hancock BSJ:[id number] DOB: 16-Apr-1951   PCP: Gala Lewandowsky, MD  Patient is from: Home  DOA: 11/25/2020 LOS: 60  Chief complaints:  Chief Complaint  Patient presents with   Wound Infection     Brief Narrative / Interim history: 70 year old F with PMH of ESRD on HD TTS, PAF on Eliquis, IDDM-2, CAD/CABG, PAD with b/l bypass, unhealing right thigh wound after right femoropopliteal bypass, HTN and HLD presenting with worsening gangrenous changes of all bilateral toes and nonhealing wound of right thigh and bilateral hips.  Vascular surgery consulted.  Patient underwent right TMA and excisional debridement of the right thigh with Dr. Scot Dock on on 6/20.   Patient sat in chair for 4 hours on 6/28 and tolerated HD in chair on 7/1.  HD schedule fluid from TTS to MWF.  Medically stable for discharge to SNF once bed is available.  Recommend palliative follow-up at SNF.  Subjective: Seen and examined earlier this morning.  No major events overnight of this morning.  Feels nauseous.  She has not had breakfast yet.  She thinks she is nauseous because she has not eaten anything yet.  Denies chest pain, dyspnea, emesis, or abdominal pain.  Objective: Vitals:   12/10/20 0009 12/10/20 0420 12/10/20 0743 12/10/20 1130  BP: (!) 123/58 (!) 124/54 (!) 139/59 (!) 114/49  Pulse: 71 69 65 71  Resp: 16 16 18 18   Temp: 98.3 F (36.8 C) 98 F (36.7 C) 98 F (36.7 C) 98 F (36.7 C)  TempSrc: Oral Oral Oral Oral  SpO2: 97% 99% 97% 100%  Weight:  63 kg    Height:        Intake/Output Summary (Last 24 hours) at 12/10/2020 1415 Last data filed at 12/09/2020 1748 Gross per 24 hour  Intake --  Output 2000 ml  Net -2000 ml    Filed Weights   12/07/20 1115 12/07/20 1424 12/10/20 0420  Weight: 64.5 kg 62 kg 63 kg    Examination:  GENERAL: Sitting on the edge of the bed. HEENT: MMM.  Vision and hearing grossly intact.  NECK: Supple.  No apparent JVD.  RESP:  No IWOB.  Fair  aeration bilaterally. CVS:  RRR. Heart sounds normal.  ABD/GI/GU: BS+. Abd soft, NTND.  MSK/EXT:  No apparent deformity.  Trace edema in the right shin.  Dry gangrene in left toes. SKIN: Dressing over right TMA and right thigh DCI.  Sacral and hip wounds. NEURO: Awake and alert. Oriented appropriately.  No apparent focal neuro deficit. PSYCH: Calm. Normal affect.   Procedures:  6/20-right TMA and excisional debridement of right thigh by Dr. Scot Dock  Microbiology summarized: COVID-19 and influenza PCR nonreactive. Blood cultures NGTD.   Assessment & Plan: Nonhealing ulcer of the right medial thigh at the site of saphenectomy incision Bilateral PAD with bilateral gangrenous changes in toes Extensive pressure skin injury of bilateral buttocks and left hip as below -S/p right TMA and excisional debridement by vascular surgery on 6/20 -VVS recs: daily dressing for right TMA wound and BID WTD dressing for right thigh wound -Outpatient follow-up with Dr. Scot Dock in 2 weeks -Continue statin and aspirin. -Sepsis ruled out.  SIRS resolved.  Blood cultures negative. -Appreciate WOCN recs. -Appreciate input by palliative care.  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 -On sodium thiosulfate with HD per nephrology -Continue local wound care per WOCN recommendation  Nausea and vomiting: A little nauseous but no emesis.  Could be due to  opiates. -Adjusted opiates. -Continue as needed antiemetics   ESRD on HD changed from TTS to MWF -HD per nephrology.   -Sat in chair for 4 hours on 6/28.  Tolerated HD in chairs on 7/1.   History of CAD/CABG: Stable. -Continue home meds  Paroxysmal atrial fibrillation: Rate controlled. -Continue amiodarone and Eliquis  Uncontrolled NIDDM-2 with hyperglycemia, ESRD and PAD: A1c 5.0% (from 11.1% on 4/21).  Only takes Tradjenta at home. Recent Labs  Lab 12/09/20 1133 12/09/20 1820 12/09/20 2114 12/10/20 0617 12/10/20 1144   GLUCAP 148* 108* 170* 100* 178*  -Continue current SSI and Tradjenta   ABLA superimposed on anemia of renal disease: Surgical blood loss?  H&H stable. Recent Labs    11/27/20 0202 11/28/20 0233 11/29/20 0102 11/30/20 0156 12/01/20 0115 12/02/20 0151 12/03/20 0127 12/05/20 0156 12/07/20 0150 12/09/20 1431  HGB 8.4* 7.9* 8.2* 7.1* 6.8* 8.5* 9.6* 9.0* 9.4* 10.0*  -Transfused 1 unit on 6/23 with appropriate response. -IV iron and ESA per nephrology. -Monitor H&H  Thrombocytopenia: Resolved. Recent Labs  Lab 12/05/20 0156 12/07/20 0150 12/09/20 1431  PLT 169 196 242  -Continue monitoring   Moderate malnutrition Body mass index is 24.62 kg/m. Nutrition Problem: Moderate Malnutrition Etiology: chronic illness (PAD, ESRD on HD) Signs/Symptoms: mild fat depletion, moderate fat depletion, mild muscle depletion, moderate muscle depletion Interventions: MVI, Prostat, Magic cup, Glucerna shake  Pressure skin injury: POA Pressure Injury Coccyx Medial Stage 3 -  Full thickness tissue loss. Subcutaneous fat may be visible but bone, tendon or muscle are NOT exposed. 2 cm x 1 cm shallow bed with yellow/white (Active)     Location: Coccyx  Location Orientation: Medial  Staging: Stage 3 -  Full thickness tissue loss. Subcutaneous fat may be visible but bone, tendon or muscle are NOT exposed.  Wound Description (Comments): 2 cm x 1 cm shallow bed with yellow/white  Present on Admission: Yes     Pressure Injury 11/25/20 Thigh Right;Medial Stage 3 -  Full thickness tissue loss. Subcutaneous fat may be visible but bone, tendon or muscle are NOT exposed. (Active)  11/25/20 1945  Location: Thigh  Location Orientation: Right;Medial  Staging: Stage 3 -  Full thickness tissue loss. Subcutaneous fat may be visible but bone, tendon or muscle are NOT exposed.  Wound Description (Comments):   Present on Admission: Yes     Pressure Injury Flank Left Unstageable - Full thickness tissue loss in  which the base of the injury is covered by slough (yellow, tan, gray, green or brown) and/or eschar (tan, brown or black) in the wound bed. (Active)     Location: Flank  Location Orientation: Left  Staging: Unstageable - Full thickness tissue loss in which the base of the injury is covered by slough (yellow, tan, gray, green or brown) and/or eschar (tan, brown or black) in the wound bed.  Wound Description (Comments):   Present on Admission: Yes     Pressure Injury 11/28/20 Buttocks Left Unstageable - Full thickness tissue loss in which the base of the injury is covered by slough (yellow, tan, gray, green or brown) and/or eschar (tan, brown or black) in the wound bed. (Active)  11/28/20 0826  Location: Buttocks  Location Orientation: Left  Staging: Unstageable - Full thickness tissue loss in which the base of the injury is covered by slough (yellow, tan, gray, green or brown) and/or eschar (tan, brown or black) in the wound bed.  Wound Description (Comments):   Present on Admission:  Pressure Injury 11/28/20 Buttocks Right Unstageable - Full thickness tissue loss in which the base of the injury is covered by slough (yellow, tan, gray, green or brown) and/or eschar (tan, brown or black) in the wound bed. (Active)  11/28/20 0826  Location: Buttocks  Location Orientation: Right  Staging: Unstageable - Full thickness tissue loss in which the base of the injury is covered by slough (yellow, tan, gray, green or brown) and/or eschar (tan, brown or black) in the wound bed.  Wound Description (Comments):   Present on Admission: Yes     Pressure Injury 11/28/20 Hip Left Unstageable - Full thickness tissue loss in which the base of the injury is covered by slough (yellow, tan, gray, green or brown) and/or eschar (tan, brown or black) in the wound bed. (Active)  11/28/20 0827  Location: Hip  Location Orientation: Left  Staging: Unstageable - Full thickness tissue loss in which the base of the  injury is covered by slough (yellow, tan, gray, green or brown) and/or eschar (tan, brown or black) in the wound bed.  Wound Description (Comments):   Present on Admission: Yes   DVT prophylaxis:   apixaban (ELIQUIS) tablet 5 mg  Code Status: Full code Family Communication: Patient and/or RN. Available if any question.  Level of care: Telemetry Medical Status is: Inpatient  Remains inpatient appropriate because:Unsafe d/c plan  Dispo: The patient is from: Home              Anticipated d/c is to: SNF              Patient currently is medically stable to d/c.     Difficult to place patient No       Consultants:  Nephrology Vascular surgery Palliative medicine   Sch Meds:  Scheduled Meds:  (feeding supplement) PROSource Plus  30 mL Oral TID BM   amiodarone  200 mg Oral Daily   apixaban  5 mg Oral BID   vitamin C  500 mg Oral BID   aspirin EC  81 mg Oral Daily   atorvastatin  20 mg Oral Daily   Chlorhexidine Gluconate Cloth  6 each Topical Q0600   Chlorhexidine Gluconate Cloth  6 each Topical Q0600   [START ON 12/12/2020] darbepoetin (ARANESP) injection - DIALYSIS  200 mcg Intravenous Q Mon-HD   famotidine  20 mg Oral Daily   folic acid  1 mg Oral Daily   hydrocortisone  25 mg Rectal BID   insulin aspart  0-6 Units Subcutaneous TID WC   lidocaine  1 application Topical W2O   linagliptin  5 mg Oral Daily   multivitamin  1 tablet Oral QHS   sevelamer carbonate  800 mg Oral TID WC   Continuous Infusions:  sodium thiosulfate infusion for calciphylaxis Stopped (12/07/20 1404)   PRN Meds:.diphenhydrAMINE **AND** acetaminophen, acetaminophen, bisacodyl, diclofenac Sodium, docusate sodium, fluticasone, HYDROmorphone (DILAUDID) injection, HYDROmorphone, metoCLOPramide (REGLAN) injection, montelukast, ondansetron (ZOFRAN) IV  Antimicrobials: Anti-infectives (From admission, onward)    Start     Dose/Rate Route Frequency Ordered Stop   11/29/20 1800  ceFEPIme (MAXIPIME) 2 g  in sodium chloride 0.9 % 100 mL IVPB        2 g 200 mL/hr over 30 Minutes Intravenous  Once 11/29/20 0908 11/29/20 2046   11/26/20 1700  ceFEPIme (MAXIPIME) 1 g in sodium chloride 0.9 % 100 mL IVPB  Status:  Discontinued        1 g 200 mL/hr over 30 Minutes Intravenous Every 24 hours  11/25/20 1713 11/29/20 0908   11/26/20 1200  vancomycin (VANCOREADY) IVPB 750 mg/150 mL  Status:  Discontinued        750 mg 150 mL/hr over 60 Minutes Intravenous Every T-Th-Sa (Hemodialysis) 11/25/20 1714 11/29/20 0909   11/26/20 1126  vancomycin (VANCOREADY) 750 MG/150ML IVPB       Note to Pharmacy: Kathryn Hancock   : cabinet override      11/26/20 1126 11/26/20 1200   11/26/20 1125  vancomycin (VANCOREADY) 750 MG/150ML IVPB       Note to Pharmacy: Kathryn Hancock   : cabinet override      11/26/20 1125 11/26/20 1159   11/25/20 1700  vancomycin (VANCOREADY) IVPB 1250 mg/250 mL        1,250 mg 166.7 mL/hr over 90 Minutes Intravenous  Once 11/25/20 1645 11/26/20 0053   11/25/20 1645  ceFEPIme (MAXIPIME) 2 g in sodium chloride 0.9 % 100 mL IVPB        2 g 200 mL/hr over 30 Minutes Intravenous  Once 11/25/20 1644 11/25/20 2255   11/25/20 1500  clindamycin (CLEOCIN) IVPB 600 mg        600 mg 100 mL/hr over 30 Minutes Intravenous  Once 11/25/20 1459 11/25/20 1606        I have personally reviewed the following labs and images: CBC: Recent Labs  Lab 12/05/20 0156 12/07/20 0150 12/09/20 1431  WBC 11.3* 10.5 6.5  HGB 9.0* 9.4* 10.0*  HCT 29.1* 30.3* 32.0*  MCV 101.7* 103.1* 100.9*  PLT 169 196 242   BMP &GFR Recent Labs  Lab 12/05/20 0156 12/07/20 0150 12/09/20 1431  NA 135 138 134*  K 4.7 4.5 3.6  CL 99 102 97*  CO2 27 27 22   GLUCOSE 133* 115* 156*  BUN 33* 30* 34*  CREATININE 3.90* 3.80* 4.00*  CALCIUM 8.3* 8.9 8.6*  MG 2.4 2.5*  --   PHOS 4.1 4.8* 4.3   Estimated Creatinine Clearance: 12.2 mL/min (A) (by C-G formula based on SCr of 4 mg/dL (H)). Liver & Pancreas: Recent Labs  Lab  12/05/20 0156 12/07/20 0150 12/09/20 1431  ALBUMIN 1.7* 1.9* 2.1*   No results for input(s): LIPASE, AMYLASE in the last 168 hours. No results for input(s): AMMONIA in the last 168 hours. Diabetic: No results for input(s): HGBA1C in the last 72 hours.  Recent Labs  Lab 12/09/20 1133 12/09/20 1820 12/09/20 2114 12/10/20 0617 12/10/20 1144  GLUCAP 148* 108* 170* 100* 178*   Cardiac Enzymes: No results for input(s): CKTOTAL, CKMB, CKMBINDEX, TROPONINI in the last 168 hours. No results for input(s): PROBNP in the last 8760 hours. Coagulation Profile: No results for input(s): INR, PROTIME in the last 168 hours.  Thyroid Function Tests: No results for input(s): TSH, T4TOTAL, FREET4, T3FREE, THYROIDAB in the last 72 hours. Lipid Profile: No results for input(s): CHOL, HDL, LDLCALC, TRIG, CHOLHDL, LDLDIRECT in the last 72 hours. Anemia Panel: No results for input(s): VITAMINB12, FOLATE, FERRITIN, TIBC, IRON, RETICCTPCT in the last 72 hours. Urine analysis:    Component Value Date/Time   COLORURINE AMBER (A) 09/30/2020 1035   APPEARANCEUR CLOUDY (A) 09/30/2020 1035   LABSPEC 1.012 09/30/2020 1035   PHURINE 8.0 09/30/2020 1035   GLUCOSEU NEGATIVE 09/30/2020 1035   HGBUR NEGATIVE 09/30/2020 1035   BILIRUBINUR NEGATIVE 09/30/2020 1035   KETONESUR NEGATIVE 09/30/2020 1035   PROTEINUR 30 (A) 09/30/2020 1035   NITRITE NEGATIVE 09/30/2020 1035   LEUKOCYTESUR LARGE (A) 09/30/2020 1035   Sepsis Labs: Invalid input(s): PROCALCITONIN,  Paint  Microbiology: No results found for this or any previous visit (from the past 240 hour(s)).   Radiology Studies: No results found.    Kathryn Hancock  If 7PM-7AM, please contact night-coverage [email or web address] 12/10/2020, 2:15 PM

## 2020-12-10 NOTE — Progress Notes (Addendum)
[facility] Progress Note   Subjective:   Patient seen and examined at bedside.  Feeling ok.  Tolerated dialysis well in chair yesterday.  Denies CP, SOB, n/v/d and abdominal pain.   Objective Vitals:   12/09/20 2025 12/10/20 0009 12/10/20 0420 12/10/20 0743  BP: (!) 120/52 (!) 123/58 (!) 124/54 (!) 139/59  Pulse: 72 71 69 65  Resp: 16 16 16 18   Temp: 98.1 F (36.7 C) 98.3 F (36.8 C) 98 F (36.7 C) 98 F (36.7 C)  TempSrc: Oral Oral Oral Oral  SpO2: 100% 97% 99% 97%  Weight:   63 kg   Height:       Physical Exam General:chronically ill appearing female in NAD Heart:RRR, no mrg Lungs:CTAB, nml WOB Abdomen:soft, NTND Extremities:trace LE, R foot wrapped Dialysis Access: TDC, LU AVF +b/t   Filed Weights   12/07/20 1115 12/07/20 1424 12/10/20 0420  Weight: 64.5 kg 62 kg 63 kg    Intake/Output Summary (Last 24 hours) at 12/10/2020 1058 Last data filed at 12/09/2020 1748 Gross per 24 hour  Intake --  Output 2000 ml  Net -2000 ml    Additional Objective Labs: Basic Metabolic Panel: Recent Labs  Lab 12/05/20 0156 12/07/20 0150 12/09/20 1431  NA 135 138 134*  K 4.7 4.5 3.6  CL 99 102 97*  CO2 27 27 22   GLUCOSE 133* 115* 156*  BUN 33* 30* 34*  CREATININE 3.90* 3.80* 4.00*  CALCIUM 8.3* 8.9 8.6*  PHOS 4.1 4.8* 4.3   Liver Function Tests: Recent Labs  Lab 12/05/20 0156 12/07/20 0150 12/09/20 1431  ALBUMIN 1.7* 1.9* 2.1*   CBC: Recent Labs  Lab 12/05/20 0156 12/07/20 0150 12/09/20 1431  WBC 11.3* 10.5 6.5  HGB 9.0* 9.4* 10.0*  HCT 29.1* 30.3* 32.0*  MCV 101.7* 103.1* 100.9*  PLT 169 196 242   CBG: Recent Labs  Lab 12/09/20 0600 12/09/20 1133 12/09/20 1820 12/09/20 2114 12/10/20 0617  GLUCAP 122* 148* 108* 170* 100*    Medications:  sodium thiosulfate infusion for calciphylaxis Stopped (12/07/20 1404)    (feeding supplement) PROSource Plus  30 mL Oral TID BM   amiodarone  200 mg Oral Daily   apixaban  5 mg Oral BID    vitamin C  500 mg Oral BID   aspirin EC  81 mg Oral Daily   atorvastatin  20 mg Oral Daily   Chlorhexidine Gluconate Cloth  6 each Topical Q0600   Chlorhexidine Gluconate Cloth  6 each Topical Q0600   [START ON 12/12/2020] darbepoetin (ARANESP) injection - DIALYSIS  200 mcg Intravenous Q Mon-HD   famotidine  20 mg Oral Daily   folic acid  1 mg Oral Daily   hydrocortisone  25 mg Rectal BID   insulin aspart  0-6 Units Subcutaneous TID WC   lidocaine  1 application Topical F7C   linagliptin  5 mg Oral Daily   multivitamin  1 tablet Oral QHS   sevelamer carbonate  800 mg Oral TID WC    Dialysis Orders: Changing to MWF here for SNFP (was TTS [facility]) 3h 65min  250/ 500 (new cannulation protocol AVF) 59.5kg 3K/ 2.5 bath  AVF and TDC  Hep none - Venofer 100mg  x 5 ordered - Mircera 146mcg IV q 2 weeks (last 6/14)   Assessment/Plan: SP R TMA - on 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 Calciphylaxis wounds: B hip/sacrum. R thigh wound debrided on 6/20. Plan to treat empirically with sodium thiosulfate and discontinue all Ca/Vit D/iron  products.  ESRD: changing to MWF for rehab/ SNF purposes (usual HD is TTS). Use TDC while inpatient to rest AVF. Pt sitting up in chair x 4 hrs on 6/28 in her room. Tolerated dialysis in chair yesterday.  Next HD on 12/12/20.   Hypertension/volume: Hypotensive on admit - home meds on hold. BP now well controlled.  Lower extremity edema improved.  UF 2L yesterday.   Anemia: Hgb ^10.0; 1unit pRBC on 6/23.  Aranesp 200 weekly. No IV iron in setting of infection.  Metabolic bone disease: calciphylaxis-  CorrCa ok, Vit D d/c. Phos in goal. Changed from calcium based binder, now on Renvela. Using 2Ca bath with dialysis as well as sodium thiosulfate tiw IV w/ HD. PO4 now at goal.  Nutrition:  Alb very low. Renal diet with fluid restrictions.   CAD  PAD  T2DM  A-fib on Eliquis  GOC: Needs ongoing dialogue and will see how wounds progress. Awaiting SNF placement.   Jen Mow, PA-C [facility] 12/10/2020,10:58 AM  LOS: 15 days   Pt seen, examined and agree w assess/plan as above with additions as indicated. Pt is getting OOB to chair and sitting for hours, getting into the chair w/ minimal help.  OK for dc to SNF.   [facility] 12/10/2020, 2:46 PM

## 2020-12-11 LAB — GLUCOSE, CAPILLARY
Glucose-Capillary: 145 mg/dL — ABNORMAL HIGH (ref 70–99)
Glucose-Capillary: 163 mg/dL — ABNORMAL HIGH (ref 70–99)
Glucose-Capillary: 159 mg/dL — ABNORMAL HIGH (ref 70–99)
Glucose-Capillary: 167 mg/dL — ABNORMAL HIGH (ref 70–99)

## 2020-12-11 MED ORDER — HYDROMORPHONE HCL 2 MG PO TABS
4.0000 mg | ORAL_TABLET | ORAL | Status: DC | PRN
Start: 2020-12-11 — End: 2020-12-13
  Administered 2020-12-11 – 2020-12-13 (×4): 4 mg via ORAL
  Filled 2020-12-11 (×4): qty 2
  Filled 2020-12-11: qty 4

## 2020-12-11 NOTE — Progress Notes (Signed)
[facility] Progress Note   Subjective:   Patient seen and examined at bedside.  Tired but otherwise doing ok.  Pain currently well controlled.  No nausea "yet" this AM.  Denies CP, SOB and abdominal pain.   Objective Vitals:   12/10/20 2030 12/11/20 0007 12/11/20 0417 12/11/20 0803  BP: (!) 123/51 (!) 110/53 (!) 123/52 (!) 122/47  Pulse: 77 69 67 67  Resp: 18 16 16 17   Temp: 98.6 F (37 C) 98.1 F (36.7 C) 98.2 F (36.8 C) 97.9 F (36.6 C)  TempSrc: Oral Oral Oral Oral  SpO2: 100% 97% 99% 96%  Weight:      Height:       Physical Exam General:chronically ill appearing female in NAD Heart:RRR, no mrg  Lungs:CTAB Abdomen:soft NTND Extremities:1+ RLE edema, foot dressed, trace edema on LLE +toes with dry gangrene  Dialysis Access: [facility], LU AVF   Filed Weights   12/07/20 1115 12/07/20 1424 12/10/20 0420  Weight: 64.5 kg 62 kg 63 kg    Intake/Output Summary (Last 24 hours) at 12/11/2020 0948 Last data filed at 12/10/2020 2030 Gross per 24 hour  Intake 100 ml  Output --  Net 100 ml    Additional Objective Labs: Basic Metabolic Panel: Recent Labs  Lab 12/05/20 0156 12/07/20 0150 12/09/20 1431  NA 135 138 134*  K 4.7 4.5 3.6  CL 99 102 97*  CO2 27 27 22   GLUCOSE 133* 115* 156*  BUN 33* 30* 34*  CREATININE 3.90* 3.80* 4.00*  CALCIUM 8.3* 8.9 8.6*  PHOS 4.1 4.8* 4.3   Liver Function Tests: Recent Labs  Lab 12/05/20 0156 12/07/20 0150 12/09/20 1431  ALBUMIN 1.7* 1.9* 2.1*    CBC: Recent Labs  Lab 12/05/20 0156 12/07/20 0150 12/09/20 1431  WBC 11.3* 10.5 6.5  HGB 9.0* 9.4* 10.0*  HCT 29.1* 30.3* 32.0*  MCV 101.7* 103.1* 100.9*  PLT 169 196 242   Blood Culture    Component Value Date/Time   SDES BLOOD RIGHT HAND 11/25/2020 2301   SPECREQUEST  11/25/2020 2301    BOTTLES DRAWN AEROBIC ONLY Blood Culture results may not be optimal due to an inadequate volume of blood received in culture bottles   CULT  11/25/2020 2301    NO GROWTH 5  DAYS Performed at [facility], [address] [place], [place] [address]    REPTSTATUS 12/01/2020 FINAL 11/25/2020 2301    CBG: Recent Labs  Lab 12/10/20 0617 12/10/20 1144 12/10/20 1707 12/10/20 2207 12/11/20 0641  GLUCAP 100* 178* 89 182* 145*     Medications:  sodium thiosulfate infusion for calciphylaxis Stopped (12/07/20 1404)    (feeding supplement) PROSource Plus  30 mL Oral TID BM   amiodarone  200 mg Oral Daily   apixaban  5 mg Oral BID   vitamin C  500 mg Oral BID   aspirin EC  81 mg Oral Daily   atorvastatin  20 mg Oral Daily   Chlorhexidine Gluconate Cloth  6 each Topical Q0600   Chlorhexidine Gluconate Cloth  6 each Topical Q0600   [START ON 12/12/2020] darbepoetin (ARANESP) injection - DIALYSIS  200 mcg Intravenous Q Mon-HD   famotidine  20 mg Oral Daily   folic acid  1 mg Oral Daily   hydrocortisone  25 mg Rectal BID   insulin aspart  0-6 Units Subcutaneous TID WC   lidocaine  1 application Topical D6U   linagliptin  5 mg Oral Daily   multivitamin  1 tablet  Oral QHS   sevelamer carbonate  800 mg Oral TID WC    Dialysis Orders: Changing to MWF here for SNFP (was TTS [facility]) 3h 99min  250/ 500 (new cannulation protocol AVF) 59.5kg 3K/ 2.5 bath  AVF and TDC  Hep none - Venofer 100mg  x 5 ordered - Mircera 184mcg IV q 2 weeks (last 6/14)   Assessment/Plan: SP R TMA - on 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 Calciphylaxis wounds: B hip/sacrum. R thigh wound debrided on 6/20. Plan to treat empirically with sodium thiosulfate and discontinue all Ca/Vit D/iron products.  ESRD: changing to MWF for rehab/ SNF purposes (usual HD is TTS). Using [facility] while inpatient to rest AVF post infiltration. Pt sitting up in chair x 4 hrs on 6/28 in her room. Tolerated dialysis in chair 12/09/20.  Next HD on 12/12/20.  Hypertension/volume: Hypotensive on admit - home meds on hold. BP now well controlled.  Continues to have LE edema.  If weights correct, 4kg over dry.  UF as  tolerated.   Anemia: Hgb ^10.0; 1unit pRBC on 6/23.  Aranesp 200 weekly. No IV iron in setting of infection.  Metabolic bone disease: calciphylaxis-  CorrCa ok, Vit D d/c. Phos in goal. Changed from calcium based binder, now on Renvela. Using 2Ca bath with dialysis as well as sodium thiosulfate tiw IV w/ HD. PO4 now at goal.  Nutrition:  Alb very low. Renal diet with fluid restrictions.   CAD  PAD  T2DM  A-fib on Eliquis  GOC: Needs ongoing dialogue and will see how wounds progress. Awaiting SNF placement.  Jen Mow, PA-C [facility] 12/11/2020,9:48 AM  LOS: 16 days

## 2020-12-11 NOTE — Progress Notes (Signed)
Daily Progress Note   Patient Name: Kathryn Hancock       Date: 12/11/2020 DOB: [birth year]  Age: 70 y.o. MRN#: [id number] Attending Physician: Geradine Girt, DO Primary Care Physician: Gala Lewandowsky, MD Admit Date: 11/25/2020  Reason for Consultation/Follow-up: Non pain symptom management and Pain control   To discuss complex medical decision making related to patient's goals of care  Chart reviewed. Patient Examined. Discussed changes in treatment management with RN.  Subjective: Ms. Allmon, 70 y.o. female, reports she is doing well today. She is looking forward to going to SNF in the near future. She understands she is just waiting for a bed at the facility. Ms. Madore reports she has been able to sit up for 4 hours in the chair for HD and hopes to continue to improve. She reports she has a good appetite and minimal nausea that is managed with hard candy.   PMT followed-up to mainly focus on pain management to prepare her for SNF. She describes her pain is managed well throughout the day, except for during dressing changes. She reports her pain is at its worst during dressing changes, particularly her "bottom." We discussed the ultimate goal of getting her only on oral pain meds for SNF and she understands. She has no other concerns or questions.   PMT provided emotional support and actively listened to patient about her family.    Assessment: 70 y.o. 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 Patient is resting in bed  comfortably. She is awake, alert, and oriented. In no acute distress. No increased or labored breathing.    Patient Profile/HPI: 70 y.o. female  with past medical history of significant PAD w/ status post left and right leg bypasses, bilateral toe ischemia, unhealed wound  to right thigh post right femoropopliteal bypass, ESRD requiring HD since January 2022, PAF, IDDM, HTN, CAP status post CABG, and HLD who was admitted on 11/25/2020 with severe wound pain and worsening gangrene on bilateral toes, right thigh wounds, and bilateral ulcers on hips. Patient has had 4 previous admissions in the last 6 months all related to PAD and gangrenous ischemia. Patient underwent right TMA and excisional debridement of right thigh on 6/20. Palliative medicine was consulted for symptom management and to aid in complex medical decision making moving forward.    Length of Stay:  16   Vital Signs: BP (!) 122/47 (BP Location: Right Arm)   Pulse 67   Temp 97.9 F (36.6 C) (Oral)   Resp 17   Ht 5\' 3"  (1.6 m)   Wt 63 kg Comment: w/ equipment on end of bed  SpO2 96%   BMI 24.62 kg/m  SpO2: SpO2: 96 % O2 Device: O2 Device: Room Air O2 Flow Rate: O2 Flow Rate (L/min): 0 L/min       Palliative Assessment/Data: 40% PPS      Palliative Care Plan    Recommendations/Plan: FULL Code/FULL Scope  Continue with full scope treatment Modified dosing of Dilaudid - Patient normally doing well with 4 mg of PO dilaudid.  Added that Patient is to receive TWO 4mg  tablet PO of Dilaudid ONE hour before dressing changes. Patient requests Outpatient Palliative to follow  Plan for patient to go to SNF for rehab sometime this week once bed is available - IV dilaudid will need to be replaced with PO dilaudid   Code Status:  Full code  Prognosis: Likely < 6 months with continued hemodialysis due to severe pain from progressive calciphylaxis   Discharge Planning: [place] for rehab with Palliative care service follow-up  Care plan was discussed with Patient.   Thank you for allowing the Palliative Medicine Team to assist in the care of this patient.  Total time spent:  35 mins      Greater than 50%  of this time was spent counseling and coordinating care related to the  above assessment and plan.  [name], PA-C Palliative Medicine Demetrios Isaacs, PA-S2  Please contact Palliative MedicineTeam phone at [phone] for questions and concerns between 7 am - 7 pm.   Please see AMION for individual provider pager numbers.

## 2020-12-11 NOTE — Progress Notes (Signed)
PROGRESS NOTE  Kathryn Hancock NTZ:[id number] DOB: 04/20/1951   PCP: Gala Lewandowsky, MD  Patient is from: Home  DOA: 11/25/2020 LOS: 56  Chief complaints:  Chief Complaint  Patient presents with   Wound Infection     Brief Narrative / Interim history: 70 year old F with PMH of ESRD on HD TTS, PAF on Eliquis, IDDM-2, CAD/CABG, PAD with b/l bypass, unhealing right thigh wound after right femoropopliteal bypass, HTN and HLD presenting with worsening gangrenous changes of all bilateral toes and nonhealing wound of right thigh and bilateral hips.  Vascular surgery consulted.  Patient underwent right TMA and excisional debridement of the right thigh with Dr. Scot Dock on on 6/20.   Patient sat in chair for 4 hours on 6/28 and tolerated HD in chair on 7/1.  HD schedule fluid from TTS to MWF.  Medically stable for discharge to SNF once bed is available.  Recommend palliative follow-up at SNF.  Subjective: No pain today  Objective: Vitals:   12/11/20 0417 12/11/20 0803 12/11/20 1114 12/11/20 1233  BP: (!) 123/52 (!) 122/47  (!) 119/52  Pulse: 67 67  72  Resp: 16 17  18   Temp: 98.2 F (36.8 C) 97.9 F (36.6 C)  98.1 F (36.7 C)  TempSrc: Oral Oral  Oral  SpO2: 99% 96%  99%  Weight:   65.3 kg   Height:        Intake/Output Summary (Last 24 hours) at 12/11/2020 1327 Last data filed at 12/10/2020 2030 Gross per 24 hour  Intake 100 ml  Output --  Net 100 ml    Filed Weights   12/10/20 0420 12/11/20 1114  Weight: 63 kg 65.3 kg    Examination:  General: Appearance:     Overweight female in no acute distress     Lungs:     respirations unlabored  Heart:    Normal heart rate. No murmurs, rubs, or gallops.   MS:   All extremities are intact.    Neurologic:   Awake, alert     Procedures:  6/20-right TMA and excisional debridement of right thigh by Dr. Scot Dock  Microbiology summarized: COVID-19 and influenza PCR nonreactive. Blood cultures NGTD.   Assessment &  Plan: Nonhealing ulcer of the right medial thigh at the site of saphenectomy incision Bilateral PAD with bilateral gangrenous changes in toes Extensive pressure skin injury of bilateral buttocks and left hip as below -S/p right TMA and excisional debridement by vascular surgery on 6/20 -VVS recs: daily dressing for right TMA wound and BID WTD dressing for right thigh wound -Outpatient follow-up with Dr. Scot Dock in 2 weeks -Continue statin and aspirin. -Sepsis ruled out.  SIRS resolved.  Blood cultures negative. -Appreciate WOCN recs. -Appreciate input by palliative care.  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 -On sodium thiosulfate with HD per nephrology -Continue local wound care per WOCN recommendation  Nausea and vomiting: A little nauseous but no emesis.  Could be due to opiates. -Adjusted opiates. -Continue as needed antiemetics   ESRD on HD changed from TTS to MWF -HD per nephrology.   -Sat in chair for 4 hours on 6/28.  Tolerated HD in chairs on 7/1.   History of CAD/CABG: Stable. -Continue home meds  Paroxysmal atrial fibrillation: Rate controlled. -Continue amiodarone and Eliquis  Uncontrolled NIDDM-2 with hyperglycemia, ESRD and PAD: A1c 5.0% (from 11.1% on 4/21).  Only takes Tradjenta at home. -Continue current SSI and Tradjenta   ABLA superimposed on anemia of renal  disease: Surgical blood loss?  H&H stable. -Transfused 1 unit on 6/23 with appropriate response. -IV iron and ESA per nephrology. -Monitor H&H  Thrombocytopenia: Resolved. -Continue monitoring   Moderate malnutrition Body mass index is 25.51 kg/m. Nutrition Problem: Moderate Malnutrition Etiology: chronic illness (PAD, ESRD on HD) Signs/Symptoms: mild fat depletion, moderate fat depletion, mild muscle depletion, moderate muscle depletion Interventions: MVI, Prostat, Magic cup, Glucerna shake  Pressure skin injury: POA Pressure Injury Coccyx Medial Stage 3 -  Full  thickness tissue loss. Subcutaneous fat may be visible but bone, tendon or muscle are NOT exposed. 2 cm x 1 cm shallow bed with yellow/white (Active)     Location: Coccyx  Location Orientation: Medial  Staging: Stage 3 -  Full thickness tissue loss. Subcutaneous fat may be visible but bone, tendon or muscle are NOT exposed.  Wound Description (Comments): 2 cm x 1 cm shallow bed with yellow/white  Present on Admission: Yes     Pressure Injury 11/25/20 Thigh Right;Medial Stage 3 -  Full thickness tissue loss. Subcutaneous fat may be visible but bone, tendon or muscle are NOT exposed. (Active)  11/25/20 1945  Location: Thigh  Location Orientation: Right;Medial  Staging: Stage 3 -  Full thickness tissue loss. Subcutaneous fat may be visible but bone, tendon or muscle are NOT exposed.  Wound Description (Comments):   Present on Admission: Yes     Pressure Injury Flank Left Unstageable - Full thickness tissue loss in which the base of the injury is covered by slough (yellow, tan, gray, green or brown) and/or eschar (tan, brown or black) in the wound bed. (Active)     Location: Flank  Location Orientation: Left  Staging: Unstageable - Full thickness tissue loss in which the base of the injury is covered by slough (yellow, tan, gray, green or brown) and/or eschar (tan, brown or black) in the wound bed.  Wound Description (Comments):   Present on Admission: Yes     Pressure Injury 11/28/20 Buttocks Left Unstageable - Full thickness tissue loss in which the base of the injury is covered by slough (yellow, tan, gray, green or brown) and/or eschar (tan, brown or black) in the wound bed. (Active)  11/28/20 0826  Location: Buttocks  Location Orientation: Left  Staging: Unstageable - Full thickness tissue loss in which the base of the injury is covered by slough (yellow, tan, gray, green or brown) and/or eschar (tan, brown or black) in the wound bed.  Wound Description (Comments):   Present on  Admission:      Pressure Injury 11/28/20 Buttocks Right Unstageable - Full thickness tissue loss in which the base of the injury is covered by slough (yellow, tan, gray, green or brown) and/or eschar (tan, brown or black) in the wound bed. (Active)  11/28/20 0826  Location: Buttocks  Location Orientation: Right  Staging: Unstageable - Full thickness tissue loss in which the base of the injury is covered by slough (yellow, tan, gray, green or brown) and/or eschar (tan, brown or black) in the wound bed.  Wound Description (Comments):   Present on Admission: Yes     Pressure Injury 11/28/20 Hip Left Unstageable - Full thickness tissue loss in which the base of the injury is covered by slough (yellow, tan, gray, green or brown) and/or eschar (tan, brown or black) in the wound bed. (Active)  11/28/20 0827  Location: Hip  Location Orientation: Left  Staging: Unstageable - Full thickness tissue loss in which the base of the injury is covered by  slough (yellow, tan, gray, green or brown) and/or eschar (tan, brown or black) in the wound bed.  Wound Description (Comments):   Present on Admission: Yes   DVT prophylaxis:   apixaban (ELIQUIS) tablet 5 mg  Code Status: Full code  Level of care: Telemetry Medical Status is: Inpatient  Remains inpatient appropriate because:Unsafe d/c plan  Dispo: The patient is from: Home              Anticipated d/c is to: SNF              Patient currently is medically stable to d/c.     Difficult to place patient No       Consultants:  Nephrology Vascular surgery Palliative medicine   Sch Meds:  Scheduled Meds:  (feeding supplement) PROSource Plus  30 mL Oral TID BM   amiodarone  200 mg Oral Daily   apixaban  5 mg Oral BID   vitamin C  500 mg Oral BID   aspirin EC  81 mg Oral Daily   atorvastatin  20 mg Oral Daily   Chlorhexidine Gluconate Cloth  6 each Topical Q0600   Chlorhexidine Gluconate Cloth  6 each Topical Q0600   [START ON 12/12/2020]  darbepoetin (ARANESP) injection - DIALYSIS  200 mcg Intravenous Q Mon-HD   famotidine  20 mg Oral Daily   folic acid  1 mg Oral Daily   hydrocortisone  25 mg Rectal BID   insulin aspart  0-6 Units Subcutaneous TID WC   lidocaine  1 application Topical F0X   linagliptin  5 mg Oral Daily   multivitamin  1 tablet Oral QHS   sevelamer carbonate  800 mg Oral TID WC   Continuous Infusions:  sodium thiosulfate infusion for calciphylaxis Stopped (12/07/20 1404)   PRN Meds:.diphenhydrAMINE **AND** acetaminophen, acetaminophen, bisacodyl, diclofenac Sodium, docusate sodium, fluticasone, HYDROmorphone (DILAUDID) injection, HYDROmorphone, metoCLOPramide (REGLAN) injection, montelukast, ondansetron (ZOFRAN) IV  Antimicrobials: Anti-infectives (From admission, onward)    Start     Dose/Rate Route Frequency Ordered Stop   11/29/20 1800  ceFEPIme (MAXIPIME) 2 g in sodium chloride 0.9 % 100 mL IVPB        2 g 200 mL/hr over 30 Minutes Intravenous  Once 11/29/20 0908 11/29/20 2046   11/26/20 1700  ceFEPIme (MAXIPIME) 1 g in sodium chloride 0.9 % 100 mL IVPB  Status:  Discontinued        1 g 200 mL/hr over 30 Minutes Intravenous Every 24 hours 11/25/20 1713 11/29/20 0908   11/26/20 1200  vancomycin (VANCOREADY) IVPB 750 mg/150 mL  Status:  Discontinued        750 mg 150 mL/hr over 60 Minutes Intravenous Every T-Th-Sa (Hemodialysis) 11/25/20 1714 11/29/20 0909   11/26/20 1126  vancomycin (VANCOREADY) 750 MG/150ML IVPB       Note to Pharmacy: Cherylann Banas   : cabinet override      11/26/20 1126 11/26/20 1200   11/26/20 1125  vancomycin (VANCOREADY) 750 MG/150ML IVPB       Note to Pharmacy: Cherylann Banas   : cabinet override      11/26/20 1125 11/26/20 1159   11/25/20 1700  vancomycin (VANCOREADY) IVPB 1250 mg/250 mL        1,250 mg 166.7 mL/hr over 90 Minutes Intravenous  Once 11/25/20 1645 11/26/20 0053   11/25/20 1645  ceFEPIme (MAXIPIME) 2 g in sodium chloride 0.9 % 100 mL IVPB        2 g 200  mL/hr over 30 Minutes  Intravenous  Once 11/25/20 1644 11/25/20 2255   11/25/20 1500  clindamycin (CLEOCIN) IVPB 600 mg        600 mg 100 mL/hr over 30 Minutes Intravenous  Once 11/25/20 1459 11/25/20 1606        I have personally reviewed the following labs and images: CBC: Recent Labs  Lab 12/05/20 0156 12/07/20 0150 12/09/20 1431  WBC 11.3* 10.5 6.5  HGB 9.0* 9.4* 10.0*  HCT 29.1* 30.3* 32.0*  MCV 101.7* 103.1* 100.9*  PLT 169 196 242   BMP &GFR Recent Labs  Lab 12/05/20 0156 12/07/20 0150 12/09/20 1431  NA 135 138 134*  K 4.7 4.5 3.6  CL 99 102 97*  CO2 27 27 22   GLUCOSE 133* 115* 156*  BUN 33* 30* 34*  CREATININE 3.90* 3.80* 4.00*  CALCIUM 8.3* 8.9 8.6*  MG 2.4 2.5*  --   PHOS 4.1 4.8* 4.3   Estimated Creatinine Clearance: 12.4 mL/min (A) (by C-G formula based on SCr of 4 mg/dL (H)). Liver & Pancreas: Recent Labs  Lab 12/05/20 0156 12/07/20 0150 12/09/20 1431  ALBUMIN 1.7* 1.9* 2.1*   No results for input(s): LIPASE, AMYLASE in the last 168 hours. No results for input(s): AMMONIA in the last 168 hours. Diabetic: No results for input(s): HGBA1C in the last 72 hours.  Recent Labs  Lab 12/10/20 1144 12/10/20 1707 12/10/20 2207 12/11/20 0641 12/11/20 1232  GLUCAP 178* 89 182* 145* 163*   Cardiac Enzymes: No results for input(s): CKTOTAL, CKMB, CKMBINDEX, TROPONINI in the last 168 hours. No results for input(s): PROBNP in the last 8760 hours. Coagulation Profile: No results for input(s): INR, PROTIME in the last 168 hours.  Thyroid Function Tests: No results for input(s): TSH, T4TOTAL, FREET4, T3FREE, THYROIDAB in the last 72 hours. Lipid Profile: No results for input(s): CHOL, HDL, LDLCALC, TRIG, CHOLHDL, LDLDIRECT in the last 72 hours. Anemia Panel: No results for input(s): VITAMINB12, FOLATE, FERRITIN, TIBC, IRON, RETICCTPCT in the last 72 hours. Urine analysis:    Component Value Date/Time   COLORURINE AMBER (A) 09/30/2020 1035    APPEARANCEUR CLOUDY (A) 09/30/2020 1035   LABSPEC 1.012 09/30/2020 1035   PHURINE 8.0 09/30/2020 1035   GLUCOSEU NEGATIVE 09/30/2020 1035   HGBUR NEGATIVE 09/30/2020 1035   BILIRUBINUR NEGATIVE 09/30/2020 1035   KETONESUR NEGATIVE 09/30/2020 1035   PROTEINUR 30 (A) 09/30/2020 1035   NITRITE NEGATIVE 09/30/2020 1035   LEUKOCYTESUR LARGE (A) 09/30/2020 1035   Sepsis Labs: Invalid input(s): PROCALCITONIN, [place]  Microbiology: No results found for this or any previous visit (from the past 240 hour(s)).   Radiology Studies: No results found.    Eulogio Bear DO [facility]  If 7PM-7AM, please contact night-coverage [email or web address] 12/11/2020, 1:27 PM

## 2020-12-12 LAB — RENAL FUNCTION PANEL
Albumin: 2.4 g/dL — ABNORMAL LOW (ref 3.5–5.0)
Anion gap: 13 (ref 5–15)
BUN: 63 mg/dL — ABNORMAL HIGH (ref 8–23)
CO2: 21 mmol/L — ABNORMAL LOW (ref 22–32)
Calcium: 8.8 mg/dL — ABNORMAL LOW (ref 8.9–10.3)
Chloride: 99 mmol/L (ref 98–111)
Creatinine, Ser: 6.62 mg/dL — ABNORMAL HIGH (ref 0.44–1.00)
GFR, Estimated: 6 mL/min — ABNORMAL LOW (ref >60)
Glucose, Bld: 172 mg/dL — ABNORMAL HIGH (ref 70–99)
Phosphorus: 6.1 mg/dL — ABNORMAL HIGH (ref 2.5–4.6)
Potassium: 5.5 mmol/L — ABNORMAL HIGH (ref 3.5–5.1)
Sodium: 133 mmol/L — ABNORMAL LOW (ref 135–145)

## 2020-12-12 LAB — CBC
HCT: 32.4 % — ABNORMAL LOW (ref 36.0–46.0)
Hemoglobin: 10.1 g/dL — ABNORMAL LOW (ref 12.0–15.0)
MCH: 32.2 pg (ref 26.0–34.0)
MCHC: 31.2 g/dL (ref 30.0–36.0)
MCV: 103.2 fL — ABNORMAL HIGH (ref 80.0–100.0)
Platelets: 236 10*3/uL (ref 150–400)
RBC: 3.14 MIL/uL — ABNORMAL LOW (ref 3.87–5.11)
RDW: 19.9 % — ABNORMAL HIGH (ref 11.5–15.5)
WBC: 10.5 10*3/uL (ref 4.0–10.5)
nRBC: 0 % (ref 0.0–0.2)

## 2020-12-12 LAB — GLUCOSE, CAPILLARY
Glucose-Capillary: 140 mg/dL — ABNORMAL HIGH (ref 70–99)
Glucose-Capillary: 204 mg/dL — ABNORMAL HIGH (ref 70–99)
Glucose-Capillary: 93 mg/dL (ref 70–99)
Glucose-Capillary: 249 mg/dL — ABNORMAL HIGH (ref 70–99)

## 2020-12-12 MED ORDER — HEPARIN SODIUM (PORCINE) 1000 UNIT/ML IJ SOLN
INTRAMUSCULAR | Status: AC
  Administered 2020-12-12: 3200 [IU]
  Filled 2020-12-12: qty 4

## 2020-12-12 MED ORDER — HYDROMORPHONE HCL 4 MG PO TABS: 4.0000 mg | ORAL_TABLET | ORAL | 0 refills | Status: DC | PRN

## 2020-12-12 MED ORDER — MENTHOL 3 MG MT LOZG
1.0000 | LOZENGE | OROMUCOSAL | Status: DC | PRN
  Filled 2020-12-12: qty 9

## 2020-12-12 MED ORDER — DARBEPOETIN ALFA 200 MCG/0.4ML IJ SOSY
PREFILLED_SYRINGE | INTRAMUSCULAR | Status: AC
  Filled 2020-12-12: qty 0.4

## 2020-12-12 MED ORDER — BISACODYL 10 MG RE SUPP: 10.0000 mg | Freq: Every day | RECTAL | Status: DC | PRN

## 2020-12-12 MED ORDER — ALBUMIN HUMAN 25 % IV SOLN: 25.0000 g | Freq: Once | INTRAVENOUS | Status: AC

## 2020-12-12 MED ORDER — ALBUMIN HUMAN 25 % IV SOLN
INTRAVENOUS | Status: AC
  Administered 2020-12-12: 25 g via INTRAVENOUS
  Filled 2020-12-12: qty 100

## 2020-12-12 MED ORDER — ONDANSETRON HCL 4 MG/2ML IJ SOLN
INTRAMUSCULAR | Status: AC
  Filled 2020-12-12: qty 2

## 2020-12-12 MED ORDER — SORBITOL 70 % SOLN
15.0000 mL | Freq: Every day | Status: DC | PRN
  Filled 2020-12-12: qty 30

## 2020-12-12 NOTE — TOC Progression Note (Signed)
Transition of Care [facility]) - Progression Note    Patient Details  Name: Kathryn Hancock MRN: [id number] Date of Birth: June 12, 1950  Transition of Care [facility]) CM/SW [place], [place] Phone Number: 12/12/2020, 10:21 AM  Clinical Narrative:     [facility] does not have availability today- possible tomorrow   Thurmond Butts, MSW, LCSW Clinical Social Worker    Expected Discharge Plan: Skilled Nursing Facility Barriers to Discharge: Continued Medical Work up, SNF Pending bed offer  Expected Discharge Plan and Services Expected Discharge Plan: [place] In-house Referral: Clinical Social Work       Expected Discharge Date: 12/12/20                                     Social Determinants of Health (SDOH) Interventions    Readmission Risk Interventions Readmission Risk Prevention Plan 11/02/2020  Transportation Screening Complete  Medication Review [employer]) Complete  HRI or [place] Complete  SW Recovery Care/Counseling Consult Complete  Palliative Care Screening Not [place] Not Applicable

## 2020-12-12 NOTE — Progress Notes (Signed)
[facility] Progress Note   Subjective:  Seen in room. C/o nausea this morning. No CP or dyspnea. Pain is manageable at the moment. SNF pending.  Objective Vitals:   12/11/20 1947 12/11/20 2345 12/12/20 0317 12/12/20 0741  BP: (!) 119/51 (!) 127/53 (!) 124/57 (!) 143/60  Pulse: 69 69 68 70  Resp: 16 16 16 15   Temp: 98.4 F (36.9 C) 98.3 F (36.8 C) 98.1 F (36.7 C) 98.4 F (36.9 C)  TempSrc: Oral Oral Oral Oral  SpO2: 100% 99% 99% 99%  Weight:   62.6 kg   Height:       Physical Exam General: Chronically ill appearing woman, NAD. Heart: RRR; 2/6 murmur Lungs: CTA anteriorly Abdomen: soft, non-tender Extremities: 2+ BLE edema; R foot and thigh bandaged, L foot with all toes with dry gangrene. Buttocks wounds bandaged Dialysis Access: TDC, LUE AVF + thrill  Additional Objective Labs: Basic Metabolic Panel: Recent Labs  Lab 12/07/20 0150 12/09/20 1431  NA 138 134*  K 4.5 3.6  CL 102 97*  CO2 27 22  GLUCOSE 115* 156*  BUN 30* 34*  CREATININE 3.80* 4.00*  CALCIUM 8.9 8.6*  PHOS 4.8* 4.3   Liver Function Tests: Recent Labs  Lab 12/07/20 0150 12/09/20 1431  ALBUMIN 1.9* 2.1*   CBC: Recent Labs  Lab 12/07/20 0150 12/09/20 1431  WBC 10.5 6.5  HGB 9.4* 10.0*  HCT 30.3* 32.0*  MCV 103.1* 100.9*  PLT 196 242   Medications:  sodium thiosulfate infusion for calciphylaxis Stopped (12/07/20 1404)    (feeding supplement) PROSource Plus  30 mL Oral TID BM   amiodarone  200 mg Oral Daily   apixaban  5 mg Oral BID   vitamin C  500 mg Oral BID   aspirin EC  81 mg Oral Daily   atorvastatin  20 mg Oral Daily   Chlorhexidine Gluconate Cloth  6 each Topical Q0600   Chlorhexidine Gluconate Cloth  6 each Topical Q0600   darbepoetin (ARANESP) injection - DIALYSIS  200 mcg Intravenous Q Mon-HD   famotidine  20 mg Oral Daily   folic acid  1 mg Oral Daily   hydrocortisone  25 mg Rectal BID   insulin aspart  0-6 Units Subcutaneous TID WC   lidocaine  1  application Topical W2H   linagliptin  5 mg Oral Daily   multivitamin  1 tablet Oral QHS   sevelamer carbonate  800 mg Oral TID WC    Dialysis Orders: MWF schedule here d/t SNF transportation (was TTS [place]) 3:45hr, 250/ 500 (new cannulation protocol AVF) 59.5kg, 3K/2.5Ca, AVF and TDC, no heparin - Venofer 100mg  x 5 ordered - Mircera 124mcg IV q 2 weeks (last 6/14)   Assessment/Plan: B foot/toe gangrene: S/p R TMA 6/20, L foot with ongoing toe gangrene.  R thigh wound: S/p debridement 6/20, infected saphenectomy incision. Calciphylaxis wounds: B hip/sacrum. Plan to treat empirically with sodium thiosulfate and discontinue all Ca/Vit D/iron products. ESRD: Changed to MWF sched for SNF transportation issues - HD today.  Using [facility] while inpatient to rest AVF post infiltration. Pt sitting up in chair x 4 hrs on 6/28 in her room. Tolerated dialysis in chair 12/09/20.  Hypertension/volume: Hypotensive on admit - home meds on hold. Still with LE edema, UF as tolerated. Anemia: Hgb 10, continue Aranesp 281mcg q Monday. S/p 1U PRBCs 6/23. No IV iron in setting of infection. Metabolic bone disease: Ca/Phos ok. Changed from calcium based binder, now on Renvela. Using 2Ca bath  with dialysis as well as sodium thiosulfate tiw IV w/ HD.  Nutrition:  Alb very low. Renal diet with fluid restrictions.   CAD  PAD  T2DM  A-fib on Eliquis  GOC: Needs ongoing dialogue and will see how wounds progress. Awaiting SNF placement.  Veneta Penton, PA-C 12/12/2020, 10:08 AM  [employer]

## 2020-12-12 NOTE — Discharge Summary (Signed)
Physician Discharge Summary  Kathryn Hancock EUM:[id number] DOB: [birth year] DOA: 11/25/2020  PCP: Gala Lewandowsky, MD  Admit date: 11/25/2020 Discharge date: 12/12/2020  Admitted From: home Discharge disposition: snf   Recommendations for Outpatient Follow-Up:   Palliative care to follow at SNF Patient is to receive TWO 4mg  tablet PO of Dilaudid ONE hour before dressing changes Resume HD Bowel regimen   Discharge Diagnosis:   Principal Problem:   Limb ischemia Active Problems:   Left atrial myxoma   Coronary artery disease involving native coronary artery of native heart without angina pectoris   ESRD (end stage renal disease) on dialysis (HCC)   PAF (paroxysmal atrial fibrillation) (HCC) -  CHA2DS2-VASc Score 5 (Age, Female, HTN, DM, Vascular)- On Eliquis   Anemia of chronic disease   Essential hypertension   DM (diabetes mellitus), type 2 with renal complications ([place])   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  Diet recommendation: DYS 3  Wound care:    Wound care  Daily       Comments: Paint hip/flank/buttock wounds with betadine swab daily or use gauze and bottled betadine to paint ulcers. Allow to air dry. Ok to cover with dry dressings if more comfortable for the patient.  Do not use any type of occlusive dressing that may promote autolysis of eschar (ex. Duoderm or foam)      Code status: Full.   History of Present Illness:   Kathryn Hancock is a 70 y.o. female with medical history significant of PAD with status post left and right leg bypasses, chronic bilateral toes ischemia, unhealing wound of right thigh after right femoropopliteal bypass, ESRD on HD TTS, PAF on Eliquis, IDDM, HTN, CAD status post CABG, HTN, HLD, presented with worsening of gangrene-like changes on bilateral toes and on healing wound of right thigh, and unhealing  ulcers of bilateral hips.   Patient has had bilateral toe ulcers since March this year due to bilateral embolization event, and gradually, change into gangrene-like changes over the last 33-month, patient underwent a right femoropopliteal in May 2022.  However, surgical wound on the right thigh never healed and then developed a unhealing ulcer since, for which the patient family has been doing most of the wound care while waiting for wound care to set up.  In addition, patient also has a chronic bilateral hip area wound, was a shallow ulcer then as per instruction from vascular surgery patient family has been applying iodine to the wound, but similarly both wound also became larger and deeper.  And patient complained severe pain associated with bilateral hip wounds especially during dialysis.  Family also noticed increasing swelling of bilateral lower extremities.  Patient denies any shortness of breath chest pains cough and denied any fever chills.   Today patient went to see vascular surgeon for postop follow-up, and vascular surgery concerned about wound infection and nonhealing wound and sent patient to ED. ED Course: Patient was given 1 dose of clindamycin.   Hospital Course by Problem:   Nonhealing ulcer of the right medial thigh at the site of saphenectomy incision Bilateral PAD with bilateral gangrenous changes in toes Extensive pressure skin injury of bilateral buttocks and left hip as below -S/p right TMA and excisional debridement by vascular surgery on 6/20 -VVS recs: daily dressing for right TMA wound and BID WTD dressing for right thigh  wound -Outpatient follow-up with Dr. Scot Dock in 2 weeks -Continue statin and aspirin. -Sepsis ruled out.  SIRS resolved.  Blood cultures negative. -Appreciate WOCN recs. -Appreciate input by palliative care.  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 -On sodium thiosulfate with HD per nephrology -Continue local wound care  per WOCN recommendation   Nausea and vomiting: A little nauseous but no emesis.  Could be due to opiates. -Adjusted opiates. -Continue as needed antiemetics -bowel regimen   ESRD on HD changed from TTS to MWF -HD per nephrology.   -Sat in chair for 4 hours on 6/28.  Tolerated HD in chair on 7/1.   History of CAD/CABG: Stable. -Continue home meds  Paroxysmal atrial fibrillation: Rate controlled. -Continue amiodarone and Eliquis   Uncontrolled NIDDM-2 with hyperglycemia, ESRD and PAD: A1c 5.0% (from 11.1% on 4/21).  Only takes Tradjenta at home. - Tradjenta   ABLA superimposed on anemia of renal disease: Surgical blood loss?  H&H stable. -Transfused 1 unit on 6/23 with appropriate response. -IV iron and ESA per nephrology. -Monitor H&H prn   Thrombocytopenia: Resolved. -Continue monitoring     Moderate malnutrition Body mass index is 25.51 kg/m. Nutrition Problem: Moderate Malnutrition Etiology: chronic illness (PAD, ESRD on HD) Signs/Symptoms: mild fat depletion, moderate fat depletion, mild muscle depletion, moderate muscle depletion Interventions: MVI, Prostat, Magic cup, Glucerna shake   Pressure skin injury: POA Pressure Injury Coccyx Medial Stage 3 -  Full thickness tissue loss. Subcutaneous fat may be visible but bone, tendon or muscle are NOT exposed. 2 cm x 1 cm shallow bed with yellow/white (Active)     Location: Coccyx  Location Orientation: Medial  Staging: Stage 3 -  Full thickness tissue loss. Subcutaneous fat may be visible but bone, tendon or muscle are NOT exposed.  Wound Description (Comments): 2 cm x 1 cm shallow bed with yellow/white  Present on Admission: Yes     Pressure Injury 11/25/20 Thigh Right;Medial Stage 3 -  Full thickness tissue loss. Subcutaneous fat may be visible but bone, tendon or muscle are NOT exposed. (Active)  11/25/20 1945  Location: Thigh  Location Orientation: Right;Medial  Staging: Stage 3 -  Full thickness tissue loss.  Subcutaneous fat may be visible but bone, tendon or muscle are NOT exposed.  Wound Description (Comments):  Present on Admission: Yes     Pressure Injury Flank Left Unstageable - Full thickness tissue loss in which the base of the injury is covered by slough (yellow, tan, gray, green or brown) and/or eschar (tan, brown or black) in the wound bed. (Active)     Location: Flank  Location Orientation: Left  Staging: Unstageable - Full thickness tissue loss in which the base of the injury is covered by slough (yellow, tan, gray, green or brown) and/or eschar (tan, brown or black) in the wound bed.  Wound Description (Comments):  Present on Admission: Yes     Pressure Injury 11/28/20 Buttocks Left Unstageable - Full thickness tissue loss in which the base of the injury is covered by slough (yellow, tan, gray, green or brown) and/or eschar (tan, brown or black) in the wound bed. (Active)  11/28/20 0826  Location: Buttocks  Location Orientation: Left  Staging: Unstageable - Full thickness tissue loss in which the base of the injury is covered by slough (yellow, tan, gray, green or brown) and/or eschar (tan, brown or black) in the wound bed.  Wound Description (Comments):  Present on Admission:     Pressure  Injury 11/28/20 Buttocks Right Unstageable - Full thickness tissue loss in which the base of the injury is covered by slough (yellow, tan, gray, green or brown) and/or eschar (tan, brown or black) in the wound bed. (Active)  11/28/20 0826  Location: Buttocks  Location Orientation: Right  Staging: Unstageable - Full thickness tissue loss in which the base of the injury is covered by slough (yellow, tan, gray, green or brown) and/or eschar (tan, brown or black) in the wound bed.  Wound Description (Comments):  Present on Admission: Yes     Pressure Injury 11/28/20 Hip Left Unstageable - Full thickness tissue loss in which the base of the injury is covered by slough (yellow, tan, gray, green or  brown) and/or eschar (tan, brown or black) in the wound bed. (Active)  11/28/20 0827  Location: Hip  Location Orientation: Left  Staging: Unstageable - Full thickness tissue loss in which the base of the injury is covered by slough (yellow, tan, gray, green or brown) and/or eschar (tan, brown or black) in the wound bed.  Wound Description (Comments):  Present on Admission: Yes      Medical Consultants:   Palliative care Nephrology vascular   Discharge Exam:   Vitals:   12/12/20 0317 12/12/20 0741  BP: (!) 124/57 (!) 143/60  Pulse: 68 70  Resp: 16 15  Temp: 98.1 F (36.7 C) 98.4 F (36.9 C)  SpO2: 99% 99%   Vitals:   12/11/20 1947 12/11/20 2345 12/12/20 0317 12/12/20 0741  BP: (!) 119/51 (!) 127/53 (!) 124/57 (!) 143/60  Pulse: 69 69 68 70  Resp: 16 16 16 15   Temp: 98.4 F (36.9 C) 98.3 F (36.8 C) 98.1 F (36.7 C) 98.4 F (36.9 C)  TempSrc: Oral Oral Oral Oral  SpO2: 100% 99% 99% 99%  Weight:   62.6 kg   Height:        General exam: Appears calm and comfortable.  The results of significant diagnostics from this hospitalization (including imaging, microbiology, ancillary and laboratory) are listed below for reference.     Procedures and Diagnostic Studies:   DG Chest Port 1 View  Result Date: 11/25/2020 CLINICAL DATA:  Questionable sepsis, evaluate for abnormality. EXAM: PORTABLE CHEST 1 VIEW COMPARISON:  Chest radiographs 09/29/2020 and earlier. FINDINGS: Unchanged position of a right-sided dialysis catheter with tip projecting at the level of the superior cavoatrial junction/upper right atrium. Prior median sternotomy/CABG. Heart size within normal limits. Aortic atherosclerosis. No appreciable airspace consolidation or pulmonary edema. No evidence of pleural effusion or pneumothorax. No acute bony abnormality identified. IMPRESSION: No evidence of acute cardiopulmonary abnormality. Aortic Atherosclerosis (ICD10-I70.0). Electronically Signed   By: Kellie Simmering  DO   On: 11/25/2020 15:01     Labs:   Basic Metabolic Panel: Recent Labs  Lab 12/07/20 0150 12/09/20 1431  NA 138 134*  K 4.5 3.6  CL 102 97*  CO2 27 22  GLUCOSE 115* 156*  BUN 30* 34*  CREATININE 3.80* 4.00*  CALCIUM 8.9 8.6*  MG 2.5*  --   PHOS 4.8* 4.3   GFR Estimated Creatinine Clearance: 11.3 mL/min (A) (by C-G formula based on SCr of 4 mg/dL (H)). Liver Function Tests: Recent Labs  Lab 12/07/20 0150 12/09/20 1431  ALBUMIN 1.9* 2.1*   No results for input(s): LIPASE, AMYLASE in the last 168 hours. No results for input(s): AMMONIA in the last 168 hours. Coagulation profile No results for input(s): INR, PROTIME in the last 168 hours.  CBC: Recent Labs  Lab 12/07/20 0150 12/09/20 1431  WBC 10.5 6.5  HGB 9.4* 10.0*  HCT 30.3* 32.0*  MCV 103.1* 100.9*  PLT 196 242   Cardiac Enzymes: No results for input(s): CKTOTAL, CKMB, CKMBINDEX, TROPONINI in the last 168 hours. BNP: Invalid input(s): POCBNP CBG: Recent Labs  Lab 12/11/20 0641 12/11/20 1232 12/11/20 1615 12/11/20 2120 12/12/20 0602  GLUCAP 145* 163* 159* 167* 140*   D-Dimer No results for input(s): DDIMER in the last 72 hours. Hgb A1c No results for input(s): HGBA1C in the last 72 hours. Lipid Profile No results for input(s): CHOL, HDL, LDLCALC, TRIG, CHOLHDL, LDLDIRECT in the last 72 hours. Thyroid function studies No results for input(s): TSH, T4TOTAL, T3FREE, THYROIDAB in the last 72 hours.  Invalid input(s): FREET3 Anemia work up No results for input(s): VITAMINB12, FOLATE, FERRITIN, TIBC, IRON, RETICCTPCT in the last 72 hours. Microbiology No results found for this or any previous visit (from the past 240 hour(s)).   Discharge Instructions:   Discharge Instructions     Discharge instructions   Complete by: As directed    DYS 3 diet with 1200 fluid restrictions   Discharge wound care:   Complete by: As directed    Wound care  Daily      Comments: Paint hip/flank/buttock  wounds with betadine swab daily or use gauze and bottled betadine to paint ulcers. Allow to air dry. Ok to cover with dry dressings if more comfortable for the patient.  Do not use any type of occlusive dressing that may promote autolysis of eschar (ex. Duoderm or foam)      Allergies as of 12/12/2020       Reactions   Penicillins Anaphylaxis   Anaphylaxis as a child        Medication List     STOP taking these medications    amLODipine 5 MG tablet Commonly known as: NORVASC   Auryxia 1 GM 210 MG(Fe) tablet Generic drug: ferric citrate   carvedilol 6.25 MG tablet Commonly known as: COREG   diphenhydramine-acetaminophen 25-500 MG Tabs tablet Commonly known as: TYLENOL PM   neomycin-bacitracin-polymyxin ointment Commonly known as: NEOSPORIN   oxyCODONE 5 MG immediate release tablet Commonly known as: Oxy IR/ROXICODONE   Vitamin D3 25 MCG tablet Commonly known as: Vitamin D       TAKE these medications    (feeding supplement) PROSource Plus liquid Take 30 mLs by mouth 3 (three) times daily between meals.   acetaminophen 650 MG CR tablet Commonly known as: TYLENOL Take 1,300 mg by mouth every 8 (eight) hours as needed for pain.   amiodarone 200 MG tablet Commonly known as: PACERONE Take 200 mg by mouth every evening.   apixaban 5 MG Tabs tablet Commonly known as: Eliquis Take 1 tablet (5 mg total) by mouth 2 (two) times daily.   aspirin EC 81 MG tablet Take 81 mg by mouth every evening. Swallow whole.   atorvastatin 20 MG tablet Commonly known as: LIPITOR Take 20 mg by mouth every evening.   bisacodyl 5 MG EC tablet Commonly known as: DULCOLAX Take 1 tablet (5 mg total) by mouth daily as needed for moderate constipation.   Darbepoetin Alfa 200 MCG/0.4ML Sosy injection Commonly known as: ARANESP Inject 0.4 mLs (200 mcg total) into the vein every Monday with hemodialysis.   docusate sodium 100 MG capsule Commonly known as: COLACE Take 100 mg by  mouth daily as needed for mild constipation.   famotidine 20 MG tablet Commonly known as: PEPCID Take 20  mg by mouth daily.   fluticasone 50 MCG/ACT nasal spray Commonly known as: FLONASE Place 2 sprays into both nostrils as needed for allergies or rhinitis.   folic acid 992 MCG tablet Commonly known as: FOLVITE Take 800 mcg by mouth daily.   glucose blood test strip 1 each by Other route as needed. Use as instructed   hydrocortisone 25 MG suppository Commonly known as: ANUSOL-HC Place 1 suppository (25 mg total) rectally 2 (two) times daily.   HYDROmorphone 4 MG tablet Commonly known as: DILAUDID Take 1 tablet (4 mg total) by mouth every 4 (four) hours as needed for moderate pain (Patient is to receive TWO 4mg  tablet PO of Dilaudid ONE hour before dressing changes.).   insulin aspart 100 UNIT/ML injection Commonly known as: novoLOG Inject 0-6 Units into the skin 3 (three) times daily with meals. CBG < 70: Implement Hypoglycemia Standing Orders and refer to Hypoglycemia Standing Orders sidebar report CBG 70 - 120: 0 units CBG 121 - 150: 0 units CBG 151 - 200: 1 unit CBG 201-250: 2 units CBG 251-300: 3 units CBG 301-350: 4 units CBG 351-400: 5 units CBG > 400: Give 6 units and call MD   lidocaine-prilocaine cream Commonly known as: EMLA Apply 1 application topically daily as needed (dialysis).   linagliptin 5 MG Tabs tablet Commonly known as: TRADJENTA Take 5 mg by mouth every evening.   loperamide 2 MG tablet Commonly known as: IMODIUM A-D Take 2 mg by mouth 4 (four) times daily as needed for diarrhea or loose stools.   metoCLOPramide 5 MG tablet Commonly known as: Reglan Take 1 tablet (5 mg total) by mouth every 8 (eight) hours as needed for refractory nausea / vomiting.   montelukast 10 MG tablet Commonly known as: SINGULAIR Take 10 mg by mouth daily as needed (allergies).   multivitamin Tabs tablet Take 1 tablet by mouth at bedtime.   ondansetron 4 MG  tablet Commonly known as: Zofran Take 1 tablet (4 mg total) by mouth daily as needed for up to 365 doses for nausea or vomiting.   sevelamer carbonate 800 MG tablet Commonly known as: RENVELA Take 1 tablet (800 mg total) by mouth 3 (three) times daily with meals.   traZODone 50 MG tablet Commonly known as: DESYREL Take 1 tablet (50 mg total) by mouth at bedtime as needed for sleep.   vitamin C 1000 MG tablet Take 1,000 mg by mouth daily.               Discharge Care Instructions  (From admission, onward)           Start     Ordered   12/12/20 0000  Discharge wound care:       Comments: Wound care  Daily      Comments: Paint hip/flank/buttock wounds with betadine swab daily or use gauze and bottled betadine to paint ulcers. Allow to air dry. Ok to cover with dry dressings if more comfortable for the patient.  Do not use any type of occlusive dressing that may promote autolysis of eschar (ex. Duoderm or foam)   12/12/20 0747            Follow-up Information     Angelia Mould, MD Follow up in 2 week(s).   Specialties: Vascular Surgery, Cardiology Why: Office will call you to arrange your appt (sent) Contact information: [address] [place] [address] [phone]  Time coordinating discharge: 35 min  Signed:  Geradine Girt DO  [facility] 12/12/2020, 7:48 AM

## 2020-12-12 NOTE — Progress Notes (Addendum)
PT Cancellation Note  Patient Details Name: Kathryn Hancock MRN: [id number] DOB: 1951-04-28   Cancelled Treatment:    Reason Eval/Treat Not Completed: Pt is nauseous and is requesting medication before treatment. PT will follow back for therapy this afternoon as able.   Fue Cervenka B. Migdalia Dk PT, DPT Acute Rehabilitation Services Pager [phone] Office [phone]    [place] 12/12/2020, 9:26 AM

## 2020-12-12 NOTE — Progress Notes (Signed)
Physical Therapy Treatment Patient Details Name: Kathryn Hancock MRN: [id number] DOB: 25-Mar-1951 Today's Date: 12/12/2020    History of Present Illness pt is a 70 y/o female presenting 6/17 with worsening of gangrene-like changes on her bil toes, non-healing wounds right thigh and non healing ulcers of bil hips.  Pt s/p right TMA and excisional debridement of right thight 6/20.  PMHx:  CAD, DM, ESRD, HTN, PAF, AAA.    PT Comments    Pt agreeable to working with therapy before going to dialysis. Pt limited in safe mobility by pain in B LE in presence of decreased strength and balance. Pt is min A for bed mobility, transfers and ambulation of 8 feet with RW. Discharge plan has changed to short term SNF prior to return home. PT is in agreement with plan and will continue to follow acutely until d/c.   Follow Up Recommendations  Supervision/Assistance - 24 hour;SNF     Equipment Recommendations  None recommended by PT;Other (comment) (TBA)       Precautions / Restrictions Precautions Precautions: Fall Restrictions Weight Bearing Restrictions: Yes LLE Weight Bearing: Partial weight bearing Other Position/Activity Restrictions: PWB through R heel with Darco shoe donned    Mobility  Bed Mobility Overal bed mobility: Needs Assistance Bed Mobility: Supine to Sit       Sit to supine: Min assist   General bed mobility comments: pt sitting EoB on entry, min A for managing pad to reduce shear with return to bed.    Transfers Overall transfer level: Needs assistance Equipment used: Rolling walker (2 wheeled) Transfers: Sit to/from [facility] Sit to Stand: Min assist;From elevated surface         General transfer comment: min A for power up and steadying with RW, once from elevated bed surface and once from recliner surface  Ambulation/Gait Ambulation/Gait assistance: Min assist Gait Distance (Feet): 8 Feet (1x6, 1x8) Assistive device: Rolling walker (2  wheeled) Gait Pattern/deviations: Step-to pattern;Antalgic;Trunk flexed Gait velocity: slowed Gait velocity interpretation: <1.31 ft/sec, indicative of household ambulator General Gait Details: min A for steadying, vc for pushing RW, able to walk to wall from bed sit in recliner pulled backwards to beside bed and able to walk to wall again, vc for management of RW and sequencing, shoe for L foot would aid in balance, however L toes swollen and necrotic         Balance Overall balance assessment: Needs assistance Sitting-balance support: Feet supported;Feet unsupported;Single extremity supported Sitting balance-Leahy Scale: Fair     Standing balance support: Bilateral upper extremity supported Standing balance-Leahy Scale: Poor Standing balance comment: minA for static/dynamic tasks with RW                            Cognition Arousal/Alertness: Awake/alert Behavior During Therapy: WFL for tasks assessed/performed;Anxious Overall Cognitive Status: Within Functional Limits for tasks assessed                                 General Comments: mildly anxious to participate         General Comments General comments (skin integrity, edema, etc.): VSS on RA      Pertinent Vitals/Pain Pain Assessment: Faces Faces Pain Scale: Hurts little more Pain Location: R foot/amp site and L calf with movement Pain Descriptors / Indicators: Grimacing;Throbbing;Aching;Discomfort     PT Goals (current goals can now be found in  the care plan section) Acute Rehab PT Goals PT Goal Formulation: With patient Time For Goal Achievement: 12/14/20 Potential to Achieve Goals: Fair Progress towards PT goals: Progressing toward goals    Frequency    Min 3X/week      PT Plan Discharge plan needs to be updated       AM-PAC PT "6 Clicks" Mobility   Outcome Measure  Help needed turning from your back to your side while in a flat bed without using bedrails?: A  Little Help needed moving from lying on your back to sitting on the side of a flat bed without using bedrails?: A Little Help needed moving to and from a bed to a chair (including a wheelchair)?: A Little Help needed standing up from a chair using your arms (e.g., wheelchair or bedside chair)?: A Little Help needed to walk in hospital room?: A Lot Help needed climbing 3-5 steps with a railing? : Total 6 Click Score: 15    End of Session Equipment Utilized During Treatment: Gait belt Activity Tolerance: Patient limited by pain;Patient limited by fatigue Patient left: with call bell/phone within reach;in bed;with bed alarm set;Other (comment) (returned to bed because she is getting ready to go to dialysis) Nurse Communication: Mobility status PT Visit Diagnosis: Other abnormalities of gait and mobility (R26.89);Muscle weakness (generalized) (M62.81);Difficulty in walking, not elsewhere classified (R26.2);Pain Pain - part of body: Hip;Leg;Ankle and joints of foot (multiple painful wounds and acute pain R amputation site; tailbone pain)     Time: 4097-3532 PT Time Calculation (min) (ACUTE ONLY): 16 min  Charges:  $Gait Training: 8-22 mins                     Yoon Barca B. Migdalia Dk PT, DPT Acute Rehabilitation Services Pager [phone] Office [phone]    [place] 12/12/2020, 2:03 PM

## 2020-12-12 NOTE — Progress Notes (Signed)
Daily Progress Note   Patient Name: Kathryn Hancock       Date: 12/12/2020 DOB: [birth year]  Age: 70 y.o. MRN#: [id number] Attending Physician: Geradine Girt, DO Primary Care Physician: Gala Lewandowsky, MD Admit Date: 11/25/2020  Reason for Consultation/Follow-up: Pain control     Chart reviewed. Patient examined. RN notified.   Subjective: Patient reports her pain has been managed well apart from during dressing changes. She reports the timing between receiving her new dose of PO pain medications and her dressing changes got messed up today; therefore, she has not been able to see if the dosing adjustments have helped yet. She agrees to continue the current pain management and follow up with palliative to let us know how the increased dose prior to dressing changes helps the pain. Patient reports she has been experiencing occasional nausea. Patient has no other questions or concerns for the PMT.   Assessment: 70 y.o. 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 Patient is resting in the edge of bed. She is awake, alert, and oriented. In no acute distress. No increased or labored breathing. Used emesis bag present on bedside table. Patient began to dry heave during assessment.    Patient Profile/HPI: 70 y.o. female  with past medical history of significant PAD w/ status post left and right leg bypasses, bilateral toe ischemia, unhealed wound to right thigh post right femoropopliteal bypass, ESRD requiring HD since January 2022, PAF, IDDM, HTN, CAP status post CABG, and HLD who was admitted on 11/25/2020 with severe wound pain and worsening gangrene on bilateral toes, right thigh wounds, and bilateral ulcers on hips. Patient has had 4 previous admissions in the last 6 months all related to PAD and  gangrenous ischemia. Patient underwent right TMA and excisional debridement of right thigh on 6/20. Palliative medicine was consulted for symptom management and to aid in complex medical decision making moving forward.    Length of Stay: 17   Vital Signs: BP (!) 97/52   Pulse 73   Temp 98.1 F (36.7 C) (Oral)   Resp 18   Ht 5\' 3"  (1.6 m)   Wt 68 kg   SpO2 99%   BMI 26.56 kg/m  SpO2: SpO2: 99 % O2 Device: O2 Device: Room Air O2 Flow Rate: O2 Flow Rate (L/min): 0 L/min  Palliative Assessment/Data: 40% PPS      Palliative Care Plan    Recommendations/Plan: FULL Code/FULL Scope Continue current dose of Dilaudid 4 mg.   - Time the 8 mg dose to be one hour prior to dressing changes  Patient requests Outpatient Palliative to follow at SNF  PMT will sign off.  Please call us back if we can be of assistance.  Code Status:  Full code  Prognosis: It would not be surprising if < 6 months with continued hemodialysis due to severe pain from progressive calcyphylaxis   Discharge Planning: [facility] for rehab with Palliative care service follow-up  Care plan was discussed with Patient   Thank you for allowing the Palliative Medicine Team to assist in the care of this patient.  Total time spent:  35 min.     Greater than 50%  of this time was spent counseling and coordinating care related to the above assessment and plan.  [name], PA-C Palliative Medicine Demetrios Isaacs, PA-S2  Please contact Palliative MedicineTeam phone at [phone] for questions and concerns between 7 am - 7 pm.   Please see AMION for individual provider pager numbers.

## 2020-12-13 LAB — GLUCOSE, CAPILLARY
Glucose-Capillary: 104 mg/dL — ABNORMAL HIGH (ref 70–99)
Glucose-Capillary: 162 mg/dL — ABNORMAL HIGH (ref 70–99)
Glucose-Capillary: 114 mg/dL — ABNORMAL HIGH (ref 70–99)

## 2020-12-13 MED ORDER — FAMOTIDINE 20 MG PO TABS: 20.0000 mg | ORAL_TABLET | Freq: Two times a day (BID) | ORAL | Status: AC

## 2020-12-13 MED ORDER — COVID-19 MRNA VACC (MODERNA) 50 MCG/0.25ML IM SUSP
0.2500 mL | Freq: Once | INTRAMUSCULAR | Status: AC
  Administered 2020-12-13: 0.25 mL via INTRAMUSCULAR
  Filled 2020-12-13: qty 0.25

## 2020-12-13 MED ORDER — FAMOTIDINE 20 MG PO TABS
20.0000 mg | ORAL_TABLET | Freq: Two times a day (BID) | ORAL | Status: DC
  Administered 2020-12-13: 20 mg via ORAL

## 2020-12-13 NOTE — Progress Notes (Signed)
Called to give report for a second time, no answer, message and contact number was left.   Chrisandra Carota, RN 12/13/2020 6:18 PM

## 2020-12-13 NOTE — Progress Notes (Signed)
Pt being D/C, VSS, AVS passed onto PTAR, IV removed, Telebox returned.   Chrisandra Carota, RN 12/13/2020 6:08 PM

## 2020-12-13 NOTE — Care Management Important Message (Signed)
Important Message  Patient Details  Name: Kathryn Hancock MRN: [id number] Date of Birth: Feb 20, 1951   Medicare Important Message Given:  Yes     Barb Merino Shirley 12/13/2020, 3:21 PM

## 2020-12-13 NOTE — Discharge Summary (Signed)
Physician Discharge Summary  Kathryn Hancock NFA:[id number] DOB: [birth year] DOA: 11/25/2020  PCP: Gala Lewandowsky, MD  Admit date: 11/25/2020 Discharge date: 12/13/2020  Admitted From: home Discharge disposition: snf   Recommendations for Outpatient Follow-Up:   Palliative care to follow at SNF Patient is to receive TWO 4mg  tablet PO of Dilaudid ONE hour before dressing changes Resume HD Bowel regimen   Discharge Diagnosis:   Principal Problem:   Limb ischemia Active Problems:   Left atrial myxoma   Coronary artery disease involving native coronary artery of native heart without angina pectoris   ESRD (end stage renal disease) on dialysis (HCC)   PAF (paroxysmal atrial fibrillation) (HCC) -  CHA2DS2-VASc Score 5 (Age, Female, HTN, DM, Vascular)- On Eliquis   Anemia of chronic disease   Essential hypertension   DM (diabetes mellitus), type 2 with renal complications ([facility])   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  Diet recommendation: DYS 3  Wound care:    Wound care  Daily       Comments: Paint hip/flank/buttock wounds with betadine swab daily or use gauze and bottled betadine to paint ulcers. Allow to air dry. Ok to cover with dry dressings if more comfortable for the patient.  Do not use any type of occlusive dressing that may promote autolysis of eschar (ex. Duoderm or foam)      Code status: Full.   History of Present Illness:   Kathryn Hancock is a 70 y.o. female with medical history significant of PAD with status post left and right leg bypasses, chronic bilateral toes ischemia, unhealing wound of right thigh after right femoropopliteal bypass, ESRD on HD TTS, PAF on Eliquis, IDDM, HTN, CAD status post CABG, HTN, HLD, presented with worsening of gangrene-like changes on bilateral toes and on healing wound of right thigh, and unhealing  ulcers of bilateral hips.   Patient has had bilateral toe ulcers since March this year due to bilateral embolization event, and gradually, change into gangrene-like changes over the last 2-month, patient underwent a right femoropopliteal in May 2022.  However, surgical wound on the right thigh never healed and then developed a unhealing ulcer since, for which the patient family has been doing most of the wound care while waiting for wound care to set up.  In addition, patient also has a chronic bilateral hip area wound, was a shallow ulcer then as per instruction from vascular surgery patient family has been applying iodine to the wound, but similarly both wound also became larger and deeper.  And patient complained severe pain associated with bilateral hip wounds especially during dialysis.  Family also noticed increasing swelling of bilateral lower extremities.  Patient denies any shortness of breath chest pains cough and denied any fever chills.   Today patient went to see vascular surgeon for postop follow-up, and vascular surgery concerned about wound infection and nonhealing wound and sent patient to ED. ED Course: Patient was given 1 dose of clindamycin.   Hospital Course by Problem:   Nonhealing ulcer of the right medial thigh at the site of saphenectomy incision Bilateral PAD with bilateral gangrenous changes in toes Extensive pressure skin injury of bilateral buttocks and left hip as below -S/p right TMA and excisional debridement by vascular surgery on 6/20 -VVS recs: daily dressing for right TMA wound and BID WTD dressing for right thigh  wound -Outpatient follow-up with Dr. Scot Dock in 2 weeks -Continue statin and aspirin. -Sepsis ruled out.  SIRS resolved.  Blood cultures negative. -Appreciate WOCN recs. -Appreciate input by palliative care.  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 -On sodium thiosulfate with HD per nephrology -Continue local wound care  per WOCN recommendation   Nausea and vomiting: A little nauseous but no emesis.  Could be due to opiates. -Adjusted opiates. -Continue as needed antiemetics -bowel regimen   ESRD on HD changed from TTS to MWF -HD per nephrology.   -Sat in chair for 4 hours on 6/28.  Tolerated HD in chair on 7/1.   History of CAD/CABG: Stable. -Continue home meds  Paroxysmal atrial fibrillation: Rate controlled. -Continue amiodarone and Eliquis   Uncontrolled NIDDM-2 with hyperglycemia, ESRD and PAD: A1c 5.0% (from 11.1% on 4/21).  Only takes Tradjenta at home. - Tradjenta   ABLA superimposed on anemia of renal disease: Surgical blood loss?  H&H stable. -Transfused 1 unit on 6/23 with appropriate response. -IV iron and ESA per nephrology. -Monitor H&H prn   Thrombocytopenia: Resolved. -Continue monitoring     Moderate malnutrition Body mass index is 25.51 kg/m. Nutrition Problem: Moderate Malnutrition Etiology: chronic illness (PAD, ESRD on HD) Signs/Symptoms: mild fat depletion, moderate fat depletion, mild muscle depletion, moderate muscle depletion Interventions: MVI, Prostat, Magic cup, Glucerna shake   Pressure skin injury: POA Pressure Injury Coccyx Medial Stage 3 -  Full thickness tissue loss. Subcutaneous fat may be visible but bone, tendon or muscle are NOT exposed. 2 cm x 1 cm shallow bed with yellow/white (Active)     Location: Coccyx  Location Orientation: Medial  Staging: Stage 3 -  Full thickness tissue loss. Subcutaneous fat may be visible but bone, tendon or muscle are NOT exposed.  Wound Description (Comments): 2 cm x 1 cm shallow bed with yellow/white  Present on Admission: Yes     Pressure Injury 11/25/20 Thigh Right;Medial Stage 3 -  Full thickness tissue loss. Subcutaneous fat may be visible but bone, tendon or muscle are NOT exposed. (Active)  11/25/20 1945  Location: Thigh  Location Orientation: Right;Medial  Staging: Stage 3 -  Full thickness tissue loss.  Subcutaneous fat may be visible but bone, tendon or muscle are NOT exposed.  Wound Description (Comments):  Present on Admission: Yes     Pressure Injury Flank Left Unstageable - Full thickness tissue loss in which the base of the injury is covered by slough (yellow, tan, gray, green or brown) and/or eschar (tan, brown or black) in the wound bed. (Active)     Location: Flank  Location Orientation: Left  Staging: Unstageable - Full thickness tissue loss in which the base of the injury is covered by slough (yellow, tan, gray, green or brown) and/or eschar (tan, brown or black) in the wound bed.  Wound Description (Comments):  Present on Admission: Yes     Pressure Injury 11/28/20 Buttocks Left Unstageable - Full thickness tissue loss in which the base of the injury is covered by slough (yellow, tan, gray, green or brown) and/or eschar (tan, brown or black) in the wound bed. (Active)  11/28/20 0826  Location: Buttocks  Location Orientation: Left  Staging: Unstageable - Full thickness tissue loss in which the base of the injury is covered by slough (yellow, tan, gray, green or brown) and/or eschar (tan, brown or black) in the wound bed.  Wound Description (Comments):  Present on Admission:     Pressure  Injury 11/28/20 Buttocks Right Unstageable - Full thickness tissue loss in which the base of the injury is covered by slough (yellow, tan, gray, green or brown) and/or eschar (tan, brown or black) in the wound bed. (Active)  11/28/20 0826  Location: Buttocks  Location Orientation: Right  Staging: Unstageable - Full thickness tissue loss in which the base of the injury is covered by slough (yellow, tan, gray, green or brown) and/or eschar (tan, brown or black) in the wound bed.  Wound Description (Comments):  Present on Admission: Yes     Pressure Injury 11/28/20 Hip Left Unstageable - Full thickness tissue loss in which the base of the injury is covered by slough (yellow, tan, gray, green or  brown) and/or eschar (tan, brown or black) in the wound bed. (Active)  11/28/20 0827  Location: Hip  Location Orientation: Left  Staging: Unstageable - Full thickness tissue loss in which the base of the injury is covered by slough (yellow, tan, gray, green or brown) and/or eschar (tan, brown or black) in the wound bed.  Wound Description (Comments):  Present on Admission: Yes      Medical Consultants:   Palliative care Nephrology vascular   Discharge Exam:   Vitals:   12/13/20 0430 12/13/20 0731  BP: (!) 130/50 (!) 154/63  Pulse: 71 80  Resp: 16 17  Temp: 98.2 F (36.8 C) 97.7 F (36.5 C)  SpO2: 99% 98%   Vitals:   12/12/20 1923 12/12/20 2335 12/13/20 0430 12/13/20 0731  BP: (!) 115/51 (!) 114/47 (!) 130/50 (!) 154/63  Pulse: 76 71 71 80  Resp: 16 17 16 17   Temp: 98.2 F (36.8 C) 98.4 F (36.9 C) 98.2 F (36.8 C) 97.7 F (36.5 C)  TempSrc: Oral Oral Oral Oral  SpO2: 100% 98% 99% 98%  Weight:      Height:        General exam: Appears calm and comfortable.  The results of significant diagnostics from this hospitalization (including imaging, microbiology, ancillary and laboratory) are listed below for reference.     Procedures and Diagnostic Studies:   DG Chest Port 1 View  Result Date: 11/25/2020 CLINICAL DATA:  Questionable sepsis, evaluate for abnormality. EXAM: PORTABLE CHEST 1 VIEW COMPARISON:  Chest radiographs 09/29/2020 and earlier. FINDINGS: Unchanged position of a right-sided dialysis catheter with tip projecting at the level of the superior cavoatrial junction/upper right atrium. Prior median sternotomy/CABG. Heart size within normal limits. Aortic atherosclerosis. No appreciable airspace consolidation or pulmonary edema. No evidence of pleural effusion or pneumothorax. No acute bony abnormality identified. IMPRESSION: No evidence of acute cardiopulmonary abnormality. Aortic Atherosclerosis (ICD10-I70.0). Electronically Signed   By: Kellie Simmering DO   On:  11/25/2020 15:01     Labs:   Basic Metabolic Panel: Recent Labs  Lab 12/07/20 0150 12/09/20 1431 12/12/20 1353  NA 138 134* 133*  K 4.5 3.6 5.5*  CL 102 97* 99  CO2 27 22 21*  GLUCOSE 115* 156* 172*  BUN 30* 34* 63*  CREATININE 3.80* 4.00* 6.62*  CALCIUM 8.9 8.6* 8.8*  MG 2.5*  --   --   PHOS 4.8* 4.3 6.1*   GFR Estimated Creatinine Clearance: 7.5 mL/min (A) (by C-G formula based on SCr of 6.62 mg/dL (H)). Liver Function Tests: Recent Labs  Lab 12/07/20 0150 12/09/20 1431 12/12/20 1353  ALBUMIN 1.9* 2.1* 2.4*   No results for input(s): LIPASE, AMYLASE in the last 168 hours. No results for input(s): AMMONIA in the last 168 hours. Coagulation profile  No results for input(s): INR, PROTIME in the last 168 hours.  CBC: Recent Labs  Lab 12/07/20 0150 12/09/20 1431 12/12/20 1353  WBC 10.5 6.5 10.5  HGB 9.4* 10.0* 10.1*  HCT 30.3* 32.0* 32.4*  MCV 103.1* 100.9* 103.2*  PLT 196 242 236   Cardiac Enzymes: No results for input(s): CKTOTAL, CKMB, CKMBINDEX, TROPONINI in the last 168 hours. BNP: Invalid input(s): POCBNP CBG: Recent Labs  Lab 12/12/20 0602 12/12/20 1105 12/12/20 1835 12/12/20 2112 12/13/20 0632  GLUCAP 140* 204* 93 249* 104*   D-Dimer No results for input(s): DDIMER in the last 72 hours. Hgb A1c No results for input(s): HGBA1C in the last 72 hours. Lipid Profile No results for input(s): CHOL, HDL, LDLCALC, TRIG, CHOLHDL, LDLDIRECT in the last 72 hours. Thyroid function studies No results for input(s): TSH, T4TOTAL, T3FREE, THYROIDAB in the last 72 hours.  Invalid input(s): FREET3 Anemia work up No results for input(s): VITAMINB12, FOLATE, FERRITIN, TIBC, IRON, RETICCTPCT in the last 72 hours. Microbiology No results found for this or any previous visit (from the past 240 hour(s)).   Discharge Instructions:   Discharge Instructions     Discharge instructions   Complete by: As directed    DYS 3 diet with 1200 fluid restrictions    Discharge wound care:   Complete by: As directed    Wound care  Daily      Comments: Paint hip/flank/buttock wounds with betadine swab daily or use gauze and bottled betadine to paint ulcers. Allow to air dry. Ok to cover with dry dressings if more comfortable for the patient.  Do not use any type of occlusive dressing that may promote autolysis of eschar (ex. Duoderm or foam)      Allergies as of 12/13/2020       Reactions   Penicillins Anaphylaxis   Anaphylaxis as a child        Medication List     STOP taking these medications    amLODipine 5 MG tablet Commonly known as: NORVASC   Auryxia 1 GM 210 MG(Fe) tablet Generic drug: ferric citrate   carvedilol 6.25 MG tablet Commonly known as: COREG   diphenhydramine-acetaminophen 25-500 MG Tabs tablet Commonly known as: TYLENOL PM   neomycin-bacitracin-polymyxin ointment Commonly known as: NEOSPORIN   oxyCODONE 5 MG immediate release tablet Commonly known as: Oxy IR/ROXICODONE   Vitamin D3 25 MCG tablet Commonly known as: Vitamin D       TAKE these medications    (feeding supplement) PROSource Plus liquid Take 30 mLs by mouth 3 (three) times daily between meals.   acetaminophen 650 MG CR tablet Commonly known as: TYLENOL Take 1,300 mg by mouth every 8 (eight) hours as needed for pain.   amiodarone 200 MG tablet Commonly known as: PACERONE Take 200 mg by mouth every evening.   apixaban 5 MG Tabs tablet Commonly known as: Eliquis Take 1 tablet (5 mg total) by mouth 2 (two) times daily.   aspirin EC 81 MG tablet Take 81 mg by mouth every evening. Swallow whole.   atorvastatin 20 MG tablet Commonly known as: LIPITOR Take 20 mg by mouth every evening.   bisacodyl 5 MG EC tablet Commonly known as: DULCOLAX Take 1 tablet (5 mg total) by mouth daily as needed for moderate constipation.   Darbepoetin Alfa 200 MCG/0.4ML Sosy injection Commonly known as: ARANESP Inject 0.4 mLs (200 mcg total) into the vein  every Monday with hemodialysis.   docusate sodium 100 MG capsule Commonly known as: COLACE  Take 100 mg by mouth daily as needed for mild constipation.   famotidine 20 MG tablet Commonly known as: PEPCID Take 1 tablet (20 mg total) by mouth 2 (two) times daily. What changed: when to take this   fluticasone 50 MCG/ACT nasal spray Commonly known as: FLONASE Place 2 sprays into both nostrils as needed for allergies or rhinitis.   folic acid 601 MCG tablet Commonly known as: FOLVITE Take 800 mcg by mouth daily.   glucose blood test strip 1 each by Other route as needed. Use as instructed   hydrocortisone 25 MG suppository Commonly known as: ANUSOL-HC Place 1 suppository (25 mg total) rectally 2 (two) times daily.   HYDROmorphone 4 MG tablet Commonly known as: DILAUDID Take 1 tablet (4 mg total) by mouth every 4 (four) hours as needed for moderate pain (Patient is to receive TWO 4mg  tablet PO of Dilaudid ONE hour before dressing changes.).   insulin aspart 100 UNIT/ML injection Commonly known as: novoLOG Inject 0-6 Units into the skin 3 (three) times daily with meals. CBG < 70: Implement Hypoglycemia Standing Orders and refer to Hypoglycemia Standing Orders sidebar report CBG 70 - 120: 0 units CBG 121 - 150: 0 units CBG 151 - 200: 1 unit CBG 201-250: 2 units CBG 251-300: 3 units CBG 301-350: 4 units CBG 351-400: 5 units CBG > 400: Give 6 units and call MD   lidocaine-prilocaine cream Commonly known as: EMLA Apply 1 application topically daily as needed (dialysis).   linagliptin 5 MG Tabs tablet Commonly known as: TRADJENTA Take 5 mg by mouth every evening.   loperamide 2 MG tablet Commonly known as: IMODIUM A-D Take 2 mg by mouth 4 (four) times daily as needed for diarrhea or loose stools.   metoCLOPramide 5 MG tablet Commonly known as: Reglan Take 1 tablet (5 mg total) by mouth every 8 (eight) hours as needed for refractory nausea / vomiting.   montelukast 10 MG  tablet Commonly known as: SINGULAIR Take 10 mg by mouth daily as needed (allergies).   multivitamin Tabs tablet Take 1 tablet by mouth at bedtime.   ondansetron 4 MG tablet Commonly known as: Zofran Take 1 tablet (4 mg total) by mouth daily as needed for up to 365 doses for nausea or vomiting.   sevelamer carbonate 800 MG tablet Commonly known as: RENVELA Take 1 tablet (800 mg total) by mouth 3 (three) times daily with meals.   traZODone 50 MG tablet Commonly known as: DESYREL Take 1 tablet (50 mg total) by mouth at bedtime as needed for sleep.   vitamin C 1000 MG tablet Take 1,000 mg by mouth daily.               Discharge Care Instructions  (From admission, onward)           Start     Ordered   12/12/20 0000  Discharge wound care:       Comments: Wound care  Daily      Comments: Paint hip/flank/buttock wounds with betadine swab daily or use gauze and bottled betadine to paint ulcers. Allow to air dry. Ok to cover with dry dressings if more comfortable for the patient.  Do not use any type of occlusive dressing that may promote autolysis of eschar (ex. Duoderm or foam)   12/12/20 0747            Follow-up Information     Angelia Mould, MD Follow up in 2 week(s).   Specialties: Vascular  Surgery, Cardiology Why: Office will call you to arrange your appt (sent) Contact information: [address] [place] [address] [phone]                  Time coordinating discharge: 35 min  Signed:  Geradine Girt DO  [facility] 12/13/2020, 11:41 AM

## 2020-12-13 NOTE — Progress Notes (Addendum)
VASCULAR SURGERY ASSESSMENT & PLAN:   S/P RIGHT TMA: Her right TMA is healing well.  She is about 2 weeks out now.  She is reportedly being discharged today to a skilled nursing facility.  I will arrange follow-up in my office in 2 weeks for staple removal.  We will also follow the dry gangrene on the left foot.  Hopefully she will continue physical therapy, partial weightbearing right foot heel only with her Darco shoe.  She obviously needs to get more mobile as this will significantly improve her prognosis.  The wound on her right thigh is healing well with good granulation tissue.  She does need continued meticulous wound care however after discharge.  She should have a wet-to-dry dressing to the wounds on her right thigh twice daily.   SUBJECTIVE:   No specific complaints.  She tells me she is being discharged today to a skilled nursing facility in [place].  PHYSICAL EXAM:   Vitals:   12/12/20 1923 12/12/20 2335 12/13/20 0430 12/13/20 0731  BP: (!) 115/51 (!) 114/47 (!) 130/50 (!) 154/63  Pulse: 76 71 71 80  Resp: 16 17 16 17   Temp: 98.2 F (36.8 C) 98.4 F (36.9 C) 98.2 F (36.8 C) 97.7 F (36.5 C)  TempSrc: Oral Oral Oral Oral  SpO2: 100% 98% 99% 98%  Weight:      Height:       Her right TMA is healing well as documented below.     The wounds on the right thigh are granulating well.    LABS:   Lab Results  Component Value Date   WBC 10.5 12/12/2020   HGB 10.1 (L) 12/12/2020   HCT 32.4 (L) 12/12/2020   MCV 103.2 (H) 12/12/2020   PLT 236 12/12/2020   Lab Results  Component Value Date   CREATININE 6.62 (H) 12/12/2020   Lab Results  Component Value Date   INR 1.3 (H) 11/28/2020   CBG (last 3)  Recent Labs    12/12/20 1835 12/12/20 2112 12/13/20 0632  GLUCAP 93 249* 104*    PROBLEM LIST:    Principal Problem:   Limb ischemia Active Problems:   Left atrial myxoma   Coronary artery disease involving native coronary artery of native heart  without angina pectoris   ESRD (end stage renal disease) on dialysis (HCC)   PAF (paroxysmal atrial fibrillation) (HCC) -  CHA2DS2-VASc Score 5 (Age, Female, HTN, DM, Vascular)- On Eliquis   Anemia of chronic disease   Essential hypertension   DM (diabetes mellitus), type 2 with renal complications (HCC)   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   HLD (hyperlipidemia)   Pressure injury of skin   Calciphylaxis   Palliative care encounter   CURRENT MEDS:    (feeding supplement) PROSource Plus  30 mL Oral TID BM   amiodarone  200 mg Oral Daily   apixaban  5 mg Oral BID   vitamin C  500 mg Oral BID   aspirin EC  81 mg Oral Daily   atorvastatin  20 mg Oral Daily   Chlorhexidine Gluconate Cloth  6 each Topical Q0600   Chlorhexidine Gluconate Cloth  6 each Topical Q0600   darbepoetin (ARANESP) injection - DIALYSIS  200 mcg Intravenous Q Mon-HD   famotidine  20 mg Oral BID   folic acid  1 mg Oral Daily   hydrocortisone  25 mg Rectal BID   insulin aspart  0-6 Units Subcutaneous TID WC   lidocaine  1 application  Topical Q6H   linagliptin  5 mg Oral Daily   multivitamin  1 tablet Oral QHS   sevelamer carbonate  800 mg Oral TID WC    Deitra Mayo Office: [phone] 12/13/2020

## 2020-12-13 NOTE — Progress Notes (Addendum)
[facility] Progress Note   Subjective:  Seen in room - comfortable at the moment. Denies CP or dyspnea. Pain decently controlled. C/o recurrent AM nausea/vomiting. Denies abd pain, fever. Tried eating some crackers last night, didn't help. Disc trial of Pepcid BID rather than qAM for a week to see if helps?  Objective Vitals:   12/12/20 1923 12/12/20 2335 12/13/20 0430 12/13/20 0731  BP: (!) 115/51 (!) 114/47 (!) 130/50 (!) 154/63  Pulse: 76 71 71 80  Resp: 16 17 16 17   Temp: 98.2 F (36.8 C) 98.4 F (36.9 C) 98.2 F (36.8 C) 97.7 F (36.5 C)  TempSrc: Oral Oral Oral Oral  SpO2: 100% 98% 99% 98%  Weight:      Height:       Physical Exam General: Chronically ill appearing woman, NAD. Heart: RRR; 2/6 murmur Lungs: CTA anteriorly Abdomen: soft, non-tender Extremities: 2+ BLE edema (R>L); R foot and thigh bandaged, L foot with all toes with dry gangrene. Buttocks wounds bandaged Dialysis Access: TDC, LUE AVF + thrill    Additional Objective Labs: Basic Metabolic Panel: Recent Labs  Lab 12/07/20 0150 12/09/20 1431 12/12/20 1353  NA 138 134* 133*  K 4.5 3.6 5.5*  CL 102 97* 99  CO2 27 22 21*  GLUCOSE 115* 156* 172*  BUN 30* 34* 63*  CREATININE 3.80* 4.00* 6.62*  CALCIUM 8.9 8.6* 8.8*  PHOS 4.8* 4.3 6.1*   Liver Function Tests: Recent Labs  Lab 12/07/20 0150 12/09/20 1431 12/12/20 1353  ALBUMIN 1.9* 2.1* 2.4*   CBC: Recent Labs  Lab 12/07/20 0150 12/09/20 1431 12/12/20 1353  WBC 10.5 6.5 10.5  HGB 9.4* 10.0* 10.1*  HCT 30.3* 32.0* 32.4*  MCV 103.1* 100.9* 103.2*  PLT 196 242 236   Medications:  sodium thiosulfate infusion for calciphylaxis Stopped (12/12/20 1736)    (feeding supplement) PROSource Plus  30 mL Oral TID BM   amiodarone  200 mg Oral Daily   apixaban  5 mg Oral BID   vitamin C  500 mg Oral BID   aspirin EC  81 mg Oral Daily   atorvastatin  20 mg Oral Daily   Chlorhexidine Gluconate Cloth  6 each Topical Q0600    Chlorhexidine Gluconate Cloth  6 each Topical Q0600   darbepoetin (ARANESP) injection - DIALYSIS  200 mcg Intravenous Q Mon-HD   famotidine  20 mg Oral BID   folic acid  1 mg Oral Daily   hydrocortisone  25 mg Rectal BID   insulin aspart  0-6 Units Subcutaneous TID WC   lidocaine  1 application Topical W5Y   linagliptin  5 mg Oral Daily   multivitamin  1 tablet Oral QHS   sevelamer carbonate  800 mg Oral TID WC    Dialysis Orders: MWF schedule here d/t SNF transportation (was [facility] [place]) 3:45hr, 250/ 500 (new cannulation protocol AVF) 59.5kg, 3K/2.5Ca, AVF and TDC, no heparin - Venofer 100mg  x 5 ordered - Mircera 156mcg IV q 2 weeks (last 6/14)   Assessment/Plan: B foot/toe gangrene: S/p R TMA 6/20, L foot with ongoing toe gangrene. R thigh wound: S/p debridement 6/20, infected saphenectomy incision. Calciphylaxis wounds: B hip/sacrum. Plan to treat empirically with sodium thiosulfate and discontinue all Ca/Vit D/iron products. ESRD: Changed to MWF sched for SNF transportation issues - next 7/6.  Using [facility] while inpatient to rest AVF post infiltration. Pt sitting up in chair x 4 hrs on 6/28 in her room. Tolerated dialysis in chair 12/09/20.  Hypertension/volume: Hypotensive  on admit - home meds on hold. Still with LE edema, UF as tolerated. Anemia: Hgb 10.2, continue Aranesp 265mcg q Monday. S/p 1U PRBCs 6/23. No IV iron in setting of infection. Metabolic bone disease: Ca/Phos ok. Changed from calcium based binder, now on Renvela. Using 2Ca bath with dialysis as well as sodium thiosulfate tiw IV w/ HD. Nutrition:  Alb very low, continue supplements. Renal diet with fluid restrictions.   CAD  PAD  T2DM  A-fib on Eliquis  GOC: Needs ongoing dialogue and will see how wounds progress. Awaiting SNF placement.  Veneta Penton, PA-C 12/13/2020, 10:11 AM  [employer]

## 2020-12-13 NOTE — TOC Transition Note (Signed)
Transition of Care [facility]) - CM/SW Discharge Note   Patient Details  Name: Kathryn Hancock MRN: [id number] Date of Birth: June 21, 1950  [facility] Care [facility]) CM/SW Contact:  Vinie Sill, LCSW Phone Number: 12/13/2020, 1:44 PM   Clinical Narrative:    Patient will Discharge to: [name]  Discharge Date: 12/13/2020 Family Notified: patient states she will call her daughter Transport IR:JJOA  Per MD patient is ready for discharge. RN, patient, and facility notified of discharge. Discharge Summary sent to facility. RN given number for report561 [phone], room 214. Ambulance transport requested for patient.   Clinical Social Worker signing off.  Thurmond Butts, MSW, LCSW Clinical Social Worker     Final next level of care: Skilled Nursing Facility Barriers to Discharge: Barriers Resolved   Patient Goals and CMS Choice        Discharge Placement              Patient chooses bed at: [facility] Patient to be transferred to facility by: [place] Name of family member notified: pateint called her daughter Patient and family notified of of transfer: 12/13/20  Discharge Plan and Services In-house Referral: Clinical Social Work                                   Social Determinants of Health (SDOH) Interventions     Readmission Risk Interventions Readmission Risk Prevention Plan 11/02/2020  Transportation Screening Complete  Medication Review Press photographer) Complete  HRI or [place] Complete  SW Recovery Care/Counseling Consult Complete  [place] Not Applicable

## 2020-12-13 NOTE — Progress Notes (Signed)
Went in to check on pt.  She looks good laying in bed.  She is not on oxygen.    Discussed changing bandages, however, she was nauseated and started vomiting.  Will check back later.   Leontine Locket, [facility] 12/13/2020 7:26 AM

## 2020-12-13 NOTE — TOC Progression Note (Signed)
Transition of Care [facility]) - Progression Note    Patient Details  Name: Kathryn Hancock MRN: [id number] Date of Birth: 1951/05/12  Transition of Care [facility]) CM/SW [place],  Phone Number: 12/13/2020, 9:55 AM  Clinical Narrative:     CSW met with patient and confirmed d/c plan to [facility] in [place]. CSW explained SNF will not be ready to admit until after 3pm.  SNF needs updated d/c summary  Informed SNF patient on air mattress and will need outpatient palliative to follow at SNF Patient is agreeable to booster shot prior to leaving for SNF   CSW will continue to follow and assist with discharge plan.   Thurmond Butts, MSW, LCSW Clinical Social Worker    Expected Discharge Plan: Skilled Nursing Facility Barriers to Discharge: Continued Medical Work up, SNF Pending bed offer  Expected Discharge Plan and Services Expected Discharge Plan: [place] In-house Referral: Clinical Social Work       Expected Discharge Date: 12/12/20                                     Social Determinants of Health (SDOH) Interventions    Readmission Risk Interventions Readmission Risk Prevention Plan 11/02/2020  Transportation Screening Complete  Medication Review Press photographer) Complete  HRI or [place] Complete  SW Recovery Care/Counseling Consult Complete  Palliative Care Screening Not [place] Not Applicable

## 2020-12-13 NOTE — Progress Notes (Signed)
Called to give report, no answer and message with my contact number has been left.   Chrisandra Carota, RN 12/13/2020 5:05 PM

## 2020-12-25 ENCOUNTER — Emergency Department (HOSPITAL_COMMUNITY)
Admission: EM | Admit: 2020-12-25 | Discharge: 2020-12-25 | Disposition: A | Discharge status: Home / Self Care | Payer: Medicare Other | Attending: Emergency Medicine | Admitting: Emergency Medicine

## 2020-12-25 ENCOUNTER — Encounter (HOSPITAL_COMMUNITY): Payer: Self-pay | Admitting: Emergency Medicine

## 2020-12-25 ENCOUNTER — Other Ambulatory Visit: Payer: Self-pay

## 2020-12-25 DIAGNOSIS — Z79899 Other long term (current) drug therapy: Secondary | ICD-10-CM | POA: Diagnosis not present

## 2020-12-25 DIAGNOSIS — F17210 Nicotine dependence, cigarettes, uncomplicated: Secondary | ICD-10-CM | POA: Diagnosis not present

## 2020-12-25 DIAGNOSIS — Z955 Presence of coronary angioplasty implant and graft: Secondary | ICD-10-CM | POA: Diagnosis not present

## 2020-12-25 DIAGNOSIS — H5789 Other specified disorders of eye and adnexa: Secondary | ICD-10-CM | POA: Diagnosis present

## 2020-12-25 DIAGNOSIS — E1122 Type 2 diabetes mellitus with diabetic chronic kidney disease: Secondary | ICD-10-CM | POA: Insufficient documentation

## 2020-12-25 DIAGNOSIS — I480 Paroxysmal atrial fibrillation: Secondary | ICD-10-CM | POA: Insufficient documentation

## 2020-12-25 DIAGNOSIS — Z794 Long term (current) use of insulin: Secondary | ICD-10-CM | POA: Diagnosis not present

## 2020-12-25 DIAGNOSIS — Z7901 Long term (current) use of anticoagulants: Secondary | ICD-10-CM | POA: Diagnosis not present

## 2020-12-25 DIAGNOSIS — Z7982 Long term (current) use of aspirin: Secondary | ICD-10-CM | POA: Insufficient documentation

## 2020-12-25 DIAGNOSIS — I2510 Atherosclerotic heart disease of native coronary artery without angina pectoris: Secondary | ICD-10-CM | POA: Insufficient documentation

## 2020-12-25 DIAGNOSIS — H1131 Conjunctival hemorrhage, right eye: Secondary | ICD-10-CM | POA: Diagnosis not present

## 2020-12-25 DIAGNOSIS — N186 End stage renal disease: Secondary | ICD-10-CM | POA: Diagnosis not present

## 2020-12-25 DIAGNOSIS — I120 Hypertensive chronic kidney disease with stage 5 chronic kidney disease or end stage renal disease: Secondary | ICD-10-CM | POA: Diagnosis not present

## 2020-12-25 DIAGNOSIS — Z8616 Personal history of COVID-19: Secondary | ICD-10-CM | POA: Insufficient documentation

## 2020-12-25 NOTE — ED Notes (Signed)
[facility] called to transport patient  

## 2020-12-25 NOTE — ED Triage Notes (Signed)
Pt BIB [facility] from [facility] in [place] c/o new R eye with red conjunctive, denies pain, trauma, changes to vision.

## 2020-12-25 NOTE — ED Provider Notes (Signed)
[facility] EMERGENCY DEPARTMENT Provider Note   CSN: [id number] Arrival date & time: 12/25/20  0930     History Chief Complaint  Patient presents with   Eye Problem    Kathryn Hancock is a 70 y.o. female.  70 year old female with multiple medical problems as listed below, on Eliquis presents with complaint of redness of the right eye which she noticed upon waking this morning. Patient reports her medication causes her to dry heave, did have an episode yesterday, otherwise denies coughing, falls, visual disturbance, eye pain or any other complaints or concerns.       Past Medical History:  Diagnosis Date   AAA (abdominal aortic aneurysm) ([place]) 06/2019   Multiple small pseudoaneurysmal projections of the dominant aorta.  Distal abdominal aortic aneurysm 4.5 x 4.7 cm.  Greatest AP dimension of the infrarenal aorta is 4.9.  No evidence of thoracic aortic aneurysm or dissection.  Brief segment of proximal IMA occlusion.   Anemia    Coronary artery disease involving native coronary artery of native heart without angina pectoris 06/01/2020   Cardiac cath at [facility] 06/01/2020-preop for atrial myxoma resection -> severe proximal RCA and PDA -> had single-vessel CABG with SVG-PDA along with myxoma resection.   Depression    DM (diabetes mellitus), type 2 with renal complications ([place]) 57/26/2035   Dry gangrene ([place]) -> right foot-toes 05/27/2020   ESRD (end stage renal disease) on dialysis ([place]) 06/2020   Progression of CKDIIIb to ESRD initially related to thromboembolic event from left atrial myxoma; complicated by perioperative hypotension-; now 1 on HD TU/TH/SAT @ [place]   GERD (gastroesophageal reflux disease)    Heavy smoker (more than 20 cigarettes per day)    ~ 2 ppd; since age 23 (41 pk yr) = has cut down to one half PPD.>   Hyperlipidemia associated with type 2 diabetes mellitus ([place]) 08/05/2020   Hypertension    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 = Status post resection December 23,2021 (done at [facility] because of no bed availability at [facility]   Microscopic hematuria    PAF (paroxysmal atrial fibrillation) ([place]) 07/01/2020   Initially noted postoperatively-left atrial myxoma resection and CABG x1.  Now on amiodarone and apixaban..   Peripheral vascular disease ([place])    Bilateral SFA & ATA occlusion.  2 V runoffvia Peroneal A & PTA. --> s/p L Fem-Pop (AK pop A) bypass (PFTE) 09/12/2020 - pending R Fem-AKPop bypass   Plantar wart of right foot    PONV (postoperative nausea and vomiting)     Patient Active Problem List   Diagnosis Date Noted   Palliative care encounter    Calciphylaxis 12/01/2020   Limb ischemia 11/25/2020   Pressure injury of skin 11/01/2020   Iron deficiency anemia, unspecified 10/22/2020   Malnutrition of moderate degree 09/30/2020   Current use of long term anticoagulation    Weakness    Anemia 09/29/2020   Personal history of COVID-19 09/28/2020   COVID-19 08/22/2020   Hyperlipidemia associated with type 2 diabetes mellitus ([place]) 08/05/2020   Hypokalemia 07/27/2020   Unspecified abnormal findings in urine 07/26/2020   Unspecified severe protein-calorie malnutrition ([place]) 07/21/2020   Encounter for immunization 07/19/2020   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[place]) 07/16/2020   Coagulation defect, unspecified ([place]) 07/16/2020   Allergy, unspecified, initial encounter 07/15/2020   Anaphylactic shock, unspecified, initial encounter 07/15/2020  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   Pain, unspecified 07/15/2020   Pruritus, unspecified 07/15/2020   Secondary hyperparathyroidism of renal origin ([place]) 07/15/2020   Shortness of breath 07/15/2020   PAOD (peripheral arterial occlusive disease) ([place]) 07/08/2020   ESRD (end stage renal disease) on dialysis ([place]) 07/01/2020   PAF (paroxysmal atrial fibrillation)  (HCC) -  CHA2DS2-VASc Score 5 (Age, Female, HTN, DM, Vascular)- On Eliquis 07/01/2020   Anemia of chronic disease 07/01/2020   Essential hypertension 07/01/2020   DM (diabetes mellitus), type 2 with renal complications ([place]) 96/22/2979   Cellulitis of toe 06/05/2020   S/P CABG (coronary artery bypass graft) 06/02/2020   Coronary artery disease involving native coronary artery of native heart without angina pectoris 06/01/2020   CKD (chronic kidney disease) stage 3, GFR 30-59 ml/min ([place]) 06/01/2020   HLD (hyperlipidemia) 06/01/2020   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 Known Para-Ren  Aneurysm. Bilat Com, Internal & External Iliacs - CFA& DFA patent.  Bilat SFA 100% @ origin - recon @ AK Pop A.; Bilateral 2 V runnoff - Bilateral Peroneal A & PTA patent w/ Bilat ATA CTO.   AV FISTULA PLACEMENT Left 07/11/2020   Procedure: LEFT FIRST STAGE BRACHIOBASILIC UPPER EXTREMITY ARTERIOVENOUS (AV) FISTULA CREATION;  Surgeon: [name], MD;  Location: [place];  Service: Vascular;  Laterality: Left;   BASCILIC VEIN TRANSPOSITION Left 10/03/2020   Procedure: BASILIC VEIN TRANSPOSITION SECOND STAGE LEFT;  Surgeon: Angelia Mould, MD;  Location: [place];  Service: Vascular;  Laterality: Left;   BIOPSY  10/01/2020   Procedure: BIOPSY;  Surgeon: Jackquline Denmark, MD;  Location: [facility] ENDOSCOPY;  Service: Endoscopy;;   Cardiac MRI  01/22/2020   Normal LV size and function.  Moderate focal basal septal hypertrophy.  No S.A.M.  LVEF 61%.  RVEF 66%.  Large mobile mass in the left atrium attached to the interatrial  septum-does not appear to thrombus characteristics consistent with left atrial myxoma.   Chest CTA  06/2019   Multiple small pseudoaneurysmal projections of the dominant aorta.  Distal abdominal aortic aneurysm 4.5 x 4.7 cm.  Greatest AP dimension of the infrarenal aorta is 4.9.  No evidence of thoracic aortic aneurysm or dissection.  Brief segment of proximal IMA occlusion.  Large geographic filling defect of the left atrium with associated dense radiopaque material.  Aortic atherosclerosis and emphysema   COLONOSCOPY W/ POLYPECTOMY     CORONARY ARTERY BYPASS GRAFT  06/02/2020   @ [facility] - Dr. Norm Parcel; SVG-rPDA along with Atrial Myxoma Resection.   ESOPHAGOGASTRODUODENOSCOPY (EGD) WITH PROPOFOL N/A 10/01/2020   Procedure: ESOPHAGOGASTRODUODENOSCOPY (EGD) WITH PROPOFOL;  Surgeon: Jackquline Denmark, MD;  Location: [facility] ENDOSCOPY;  Service: Endoscopy;  Laterality: N/A;   EYE SURGERY Bilateral    Cataract surgery   FEMORAL-POPLITEAL BYPASS GRAFT Left 09/12/2020   Procedure: LEFT FEMORAL-ABOVE KNEE POPLITEAL ARTERY BYPASS GRAFT;  Surgeon: Angelia Mould, MD;  Location: [place];  Service: Vascular;  Laterality: Left;   FEMORAL-POPLITEAL BYPASS GRAFT Right 10/31/2020   Procedure: RIGHT BYPASS GRAFT FEMORAL- ABOVE KNEE POPLITEAL ARTERY;  Surgeon: Angelia Mould, MD;  Location: [facility]  OR;  Service: Vascular;  Laterality: Right;   HOT HEMOSTASIS N/A 10/01/2020   Procedure: HOT HEMOSTASIS (ARGON PLASMA COAGULATION/BICAP);  Surgeon: Jackquline Denmark, MD;  Location: [facility] ENDOSCOPY;  Service: Endoscopy;  Laterality: N/A;   I & D EXTREMITY Right 11/28/2020   Procedure: IRRIGATION AND DEBRIDEMENT RIGHT UPPER LEG WOUND;  Surgeon: Angelia Mould, MD;  Location: [facility] OR;  Service: Vascular;  Laterality: Right;   INTRAOPERATIVE TRANSESOPHAGEAL ECHOCARDIOGRAM  06/02/2020   [facility] (LA Myxoma Resection & CABG x 1): Pre-op EF> 55%. Normal WM. Mild AI. Large LA mass with calcifications &minimal vascularity (5.92 x 4 cm @  largest. LA dilation. Diffuse atherosclerosis of descending Ao, focal calcification of Sinus of  Valsalva. -> Post-op. S/p Mass Excision - no residual mass in LA. No ASD or PFO.  EF >55% no WMA.   IR FLUORO GUIDE CV LINE RIGHT  07/06/2020   IR US GUIDE VASC ACCESS RIGHT  07/06/2020   LEFT ATRIAL MYXOMA RESECTION Left 06/02/2020   [facility]: Dr. Norm Parcel   LEFT HEART CATH AND CORONARY ANGIOGRAPHY  05/2020   [facility]: Biplane Coronary Angiography, Significant Prox RCA & rPDA disease. Normal CO/CI with elevated LVEDP.   TRANSESOPHAGEAL ECHOCARDIOGRAM  05/31/2020   [facility]:  Normal LV function.  No LAA thrombus.  Large mobile left atrial mass attached to the left atrial septum (4.8 x 3.1 cm with calcification)-most consistent with atrial myxoma.  EF estimated 55%.  Normal valves.   TRANSMETATARSAL AMPUTATION Right 11/28/2020   Procedure: RIGHT TRANSMETATARSAL AMPUTATION;  Surgeon: Angelia Mould, MD;  Location: [facility] OR;  Service: Vascular;  Laterality: Right;   TRANSTHORACIC ECHOCARDIOGRAM  07/2019    EF 60 to 65%.  GR 1 DD.  Elevated LVEDP.  Large size ill-defined echodensity in the left atrium suspicious for thrombus versus tumor.  Moderately dilated LA.   TRANSTHORACIC ECHOCARDIOGRAM  08/31/2020   (Postop left atrial myxoma resection) normal LVEF of 55-60%.  No R WMA.  GR 1 DD.  Mildly reduced RV function with normal PAP.  Trivial AI.  Left atrial myxoma no longer present.  Mitral valve stable trivial MR.   TUBAL LIGATION       OB History   No obstetric history on file.     Family History  Problem Relation Age of Onset   Diabetes Mother    Diabetes Father    Heart attack Father 19   CAD Father    Hyperlipidemia Father    Hypertension Father     Social History   Tobacco Use   Smoking status: Every Day    Packs/day: 0.50    Years: 50.00    Pack years: 25.00    Types: Cigarettes   Smokeless tobacco: Never  Vaping Use   Vaping Use: Never used  Substance Use Topics   Alcohol use: Not  Currently   Drug use: Never    Home Medications Prior to Admission medications   Medication Sig Start Date End Date Taking? Authorizing Provider  acetaminophen (TYLENOL) 650 MG CR tablet Take 1,300 mg by mouth every 8 (eight) hours as needed for pain.    [provider]  amiodarone (PACERONE) 200 MG tablet Take 200 mg by mouth every evening. 06/10/20 06/10/21  [provider]  apixaban (ELIQUIS) 5 MG TABS tablet Take 1 tablet (5 mg total) by mouth 2 (two) times daily. 12/10/19   Leonie Man, MD  Ascorbic Acid (VITAMIN C) 1000 MG tablet Take 1,000 mg by mouth daily.    [provider]  aspirin EC 81 MG tablet Take 81 mg by mouth every evening. Swallow whole.    [provider]  atorvastatin (LIPITOR) 20 MG tablet Take 20 mg by mouth every evening.    [provider]  bisacodyl (DULCOLAX) 5 MG EC tablet Take 1 tablet (5 mg total) by mouth daily as needed for moderate constipation. 12/10/20   Mercy Riding, MD  Darbepoetin Alfa (ARANESP) 200 MCG/0.4ML SOSY injection Inject 0.4 mLs (200 mcg total) into the vein every Monday with hemodialysis. 12/12/20   Mercy Riding, MD  docusate sodium (COLACE) 100 MG capsule Take 100 mg by mouth daily as needed for mild constipation.    [provider]  famotidine (PEPCID) 20 MG tablet Take 1 tablet (20 mg total) by mouth 2 (two) times daily. 12/13/20   Geradine Girt, DO  fluticasone (FLONASE) 50 MCG/ACT nasal spray Place 2 sprays into both nostrils as needed for allergies or rhinitis.    [provider]  folic acid (FOLVITE) 203 MCG tablet Take 800 mcg by mouth daily.    [provider]  glucose blood test strip 1 each by Other route as needed. Use as instructed    [provider]  hydrocortisone (ANUSOL-HC) 25 MG suppository Place 1 suppository (25 mg total) rectally 2 (two) times daily. 12/10/20   Mercy Riding, MD  HYDROmorphone (DILAUDID) 4 MG tablet Take 1 tablet (4 mg total) by  mouth every 4 (four) hours as needed for moderate pain (Patient is to receive TWO 4mg  tablet PO of Dilaudid ONE hour before dressing changes.). 12/12/20   Geradine Girt, DO  insulin aspart (NOVOLOG) 100 UNIT/ML injection Inject 0-6 Units into the skin 3 (three) times daily with meals. CBG < 70: Implement Hypoglycemia Standing Orders and refer to Hypoglycemia Standing Orders sidebar report CBG 70 - 120: 0 units CBG 121 - 150: 0 units CBG 151 - 200: 1 unit CBG 201-250: 2 units CBG 251-300: 3 units CBG 301-350: 4 units CBG 351-400: 5 units CBG > 400: Give 6 units and call MD 12/10/20   Mercy Riding, MD  lidocaine-prilocaine (EMLA) cream Apply 1 application topically daily as needed (dialysis). 08/12/20   [provider]  linagliptin (TRADJENTA) 5 MG TABS tablet Take 5 mg by mouth every evening.    [provider]  loperamide (IMODIUM A-D) 2 MG tablet Take 2 mg by mouth 4 (four) times daily as needed for diarrhea or loose stools.    [provider]  metoCLOPramide (REGLAN) 5 MG tablet Take 1 tablet (5 mg total) by mouth every 8 (eight) hours as needed for refractory nausea / vomiting. 12/10/20 12/10/21  Mercy Riding, MD  montelukast (SINGULAIR) 10 MG tablet Take 10 mg by mouth daily as needed (allergies).    [provider]  multivitamin (RENA-VIT) TABS tablet Take 1 tablet by mouth at bedtime. 12/10/20   Mercy Riding, MD  Nutritional Supplements (,FEEDING SUPPLEMENT, PROSOURCE PLUS) liquid Take 30 mLs by mouth 3 (three) times daily between meals. 12/10/20   Mercy Riding, MD  ondansetron (ZOFRAN) 4 MG tablet Take 1 tablet (4 mg total) by mouth daily as needed for up to 365 doses for nausea or vomiting. 12/10/20   Mercy Riding, MD  sevelamer carbonate (RENVELA) 800 MG tablet Take 1 tablet (800 mg total) by mouth 3 (three) times daily with meals. 12/10/20   Mercy Riding, MD  traZODone (DESYREL) 50 MG tablet Take 1  tablet (50 mg total) by mouth at bedtime as needed for sleep. 12/10/20    Mercy Riding, MD    Allergies    Penicillins  Review of Systems   Review of Systems  Constitutional:  Negative for fever.  Eyes:  Positive for redness. Negative for photophobia, pain and visual disturbance.  Musculoskeletal:  Negative for neck pain and neck stiffness.  Skin:  Negative for rash and wound.  Allergic/Immunologic: Positive for immunocompromised state.  Neurological:  Negative for headaches.  Hematological:  Bruises/bleeds easily.  Psychiatric/Behavioral:  Negative for confusion.    Physical Exam Updated Vital Signs BP 136/61 (BP Location: Right Arm)   Pulse 71   Temp 98.6 F (37 C) (Oral)   Resp 16   Ht 5\' 3"  (1.6 m)   Wt 62.1 kg   SpO2 100%   BMI 24.27 kg/m   Physical Exam Vitals and nursing note reviewed.  Constitutional:      General: She is not in acute distress.    Appearance: She is well-developed. She is not diaphoretic.  HENT:     Head: Normocephalic and atraumatic.  Eyes:     General: Vision grossly intact.     Conjunctiva/sclera:     Right eye: Hemorrhage present.     Left eye: No hemorrhage.    Pupils: Pupils are equal, round, and reactive to light.     Funduscopic exam:    Right eye: Red reflex present.        Left eye: Red reflex present. Pulmonary:     Effort: Pulmonary effort is normal.  Musculoskeletal:     Cervical back: Normal range of motion.  Skin:    General: Skin is warm and dry.     Findings: No erythema or rash.  Neurological:     Mental Status: She is alert and oriented to person, place, and time.  Psychiatric:        Behavior: Behavior normal.    ED Results / Procedures / Treatments   Labs (all labs ordered are listed, but only abnormal results are displayed) Labs Reviewed - No data to display  EKG None  Radiology No results found.  Procedures Procedures   Medications Ordered in ED Medications - No data to display  ED Course  I have reviewed the triage vital signs and the nursing notes.  Pertinent  labs & imaging results that were available during my care of the patient were reviewed by me and considered in my medical decision making (see chart for details).  Clinical Course as of 12/25/20 1025  Sun Dec 25, 2020  1023 70 yo female with large subconjunctival hemorrhage, likely due to dry heaves last night, on Eliquis. No eye pain, gross vision intact. Discussed with Dr. Vanita Panda, ER attending who has seen the patient. Referred to ophtho for follow up. She is prescribed daily zofran and PRN reglan, she is not taking the Reglan currently, discussed starting this again.  [LM]    Clinical Course User Index [LM] Roque Lias   MDM Rules/Calculators/A&P                          Final Clinical Impression(s) / ED Diagnoses Final diagnoses:  Subconjunctival hemorrhage of right eye    Rx / DC Orders ED Discharge Orders     None        Roque Lias 12/25/20 1025    Carmin Muskrat, MD 12/27/20 1539

## 2020-12-25 NOTE — Discharge Instructions (Addendum)
Call the eye doctor in the morning to make an appointment. Use your Reglan to help with nausea/vomiting.

## 2020-12-25 NOTE — ED Notes (Signed)
This RN called [facility] to give report. Voice mail left on a nurse line with this RN contact info.

## 2020-12-28 ENCOUNTER — Encounter: Payer: Self-pay | Admitting: Vascular Surgery

## 2020-12-28 ENCOUNTER — Ambulatory Visit (INDEPENDENT_AMBULATORY_CARE_PROVIDER_SITE_OTHER): Payer: Medicare Other | Admitting: Vascular Surgery

## 2020-12-28 ENCOUNTER — Other Ambulatory Visit: Payer: Self-pay

## 2020-12-28 VITALS — BP 137/74 | HR 75 | Temp 97.7°F | Resp 14 | Ht 63.0 in | Wt 138.6 lb

## 2020-12-28 DIAGNOSIS — I739 Peripheral vascular disease, unspecified: Secondary | ICD-10-CM

## 2020-12-28 NOTE — Progress Notes (Signed)
Patient name: Kathryn Hancock MRN: [id number] DOB: 11-06-1950 Sex: female  REASON FOR VISIT:   Follow-up of dry gangrene of both feet and peripheral vascular disease.  HPI:   Kathryn Hancock is a pleasant 70 y.o. female who is well-known to me.  She is undergone a right femoral to above-knee popliteal artery bypass with a vein graft.  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  She had undergone a previous left femoral to above-knee popliteal artery bypass with PTFE.   On 11/28/2020 she underwent a right transmetatarsal amputation and debridement of the wound of the right thigh.  I was reluctant to consider bilateral transmetatarsal amputations as I thought this would make her nonambulatory and significantly impact her chance for rehabilitation.  The plan was to try to get the right TMA healed before considering working on the left.   While she was in the hospital she was followed by palliative care she has a large decubitus ulcer on her back.  I also debrided the wound on her right thigh which was granulating at the time of discharge.  She comes in for a follow-up visit.   Dressings are being done at the skilled nursing facility.  She is on an air mattress bed.  Current Outpatient Medications  Medication Sig Dispense Refill   acetaminophen (TYLENOL) 650 MG CR tablet Take 1,300 mg by mouth every 8 (eight) hours as needed for pain.     amiodarone (PACERONE) 200 MG tablet Take 200 mg by mouth every evening.     apixaban (ELIQUIS) 5 MG TABS tablet Take 1 tablet (5 mg total) by mouth 2 (two) times daily. 60 tablet 6   Ascorbic Acid (VITAMIN C) 1000 MG tablet Take 1,000 mg by mouth daily.     aspirin EC 81 MG tablet Take 81 mg by mouth every evening. Swallow whole.     atorvastatin (LIPITOR) 20 MG tablet Take 20 mg by mouth every evening.     bisacodyl (DULCOLAX) 5 MG EC tablet Take 1 tablet (5 mg total) by mouth daily as needed for moderate  constipation. 30 tablet 0   Darbepoetin Alfa (ARANESP) 200 MCG/0.4ML SOSY injection Inject 0.4 mLs (200 mcg total) into the vein every Monday with hemodialysis. 1.68 mL    docusate sodium (COLACE) 100 MG capsule Take 100 mg by mouth daily as needed for mild constipation.     famotidine (PEPCID) 20 MG tablet Take 1 tablet (20 mg total) by mouth 2 (two) times daily.     fluticasone (FLONASE) 50 MCG/ACT nasal spray Place 2 sprays into both nostrils as needed for allergies or rhinitis.     folic acid (FOLVITE) 492 MCG tablet Take 800 mcg by mouth daily.     glucose blood test strip 1 each by Other route as needed. Use as instructed     hydrocortisone (ANUSOL-HC) 25 MG suppository Place 1 suppository (25 mg total) rectally 2 (two) times daily. 12 suppository 0   HYDROmorphone (DILAUDID) 4 MG tablet Take 1 tablet (4 mg total) by mouth every 4 (four) hours as needed for moderate pain (Patient is to receive TWO 4mg  tablet PO of Dilaudid ONE hour before dressing changes.). 6 tablet 0   insulin aspart (NOVOLOG) 100 UNIT/ML injection Inject 0-6 Units into the skin 3 (three) times daily with meals. CBG < 70: Implement Hypoglycemia Standing Orders and refer to Hypoglycemia Standing Orders sidebar report CBG 70 - 120: 0 units  CBG 121 - 150: 0 units CBG 151 - 200: 1 unit CBG 201-250: 2 units CBG 251-300: 3 units CBG 301-350: 4 units CBG 351-400: 5 units CBG > 400: Give 6 units and call MD 10 mL 11   lidocaine-prilocaine (EMLA) cream Apply 1 application topically daily as needed (dialysis).     linagliptin (TRADJENTA) 5 MG TABS tablet Take 5 mg by mouth every evening.     loperamide (IMODIUM A-D) 2 MG tablet Take 2 mg by mouth 4 (four) times daily as needed for diarrhea or loose stools.     metoCLOPramide (REGLAN) 5 MG tablet Take 1 tablet (5 mg total) by mouth every 8 (eight) hours as needed for refractory nausea / vomiting. 90 tablet 1   montelukast (SINGULAIR) 10 MG tablet Take 10 mg by mouth daily as needed  (allergies).     multivitamin (RENA-VIT) TABS tablet Take 1 tablet by mouth at bedtime.  0   Nutritional Supplements (,FEEDING SUPPLEMENT, PROSOURCE PLUS) liquid Take 30 mLs by mouth 3 (three) times daily between meals.     ondansetron (ZOFRAN) 4 MG tablet Take 1 tablet (4 mg total) by mouth daily as needed for up to 365 doses for nausea or vomiting. 20 tablet 0   sevelamer carbonate (RENVELA) 800 MG tablet Take 1 tablet (800 mg total) by mouth 3 (three) times daily with meals.     traZODone (DESYREL) 50 MG tablet Take 1 tablet (50 mg total) by mouth at bedtime as needed for sleep.     No current facility-administered medications for this visit.    REVIEW OF SYSTEMS:  [X]  denotes positive finding, [ ]  denotes negative finding Vascular    Leg swelling    Cardiac    Chest pain or chest pressure:    Shortness of breath upon exertion:    Short of breath when lying flat:    Irregular heart rhythm:    Constitutional    Fever or chills:     PHYSICAL EXAM:   Vitals:   12/28/20 1325  BP: 137/74  Pulse: 75  Resp: 14  Temp: 97.7 F (36.5 C)  TempSrc: Temporal  SpO2: 100%  Weight: 138 lb 9.6 oz (62.9 kg)  Height: 5\' 3"  (1.6 m)    GENERAL: The patient is a well-nourished female, in no acute distress. The vital signs are documented above. CARDIOVASCULAR: There is a regular rate and rhythm. PULMONARY: There is good air exchange bilaterally without wheezing or rales. VASCULAR: She has significant bilateral lower extremity swelling which I think is affecting the healing of both feet. On the right side she has a brisk posterior tibial signal and reasonable anterior tibial signal with the Doppler. On the left side she has a brisk posterior tibial and anterior tibial signal with the Doppler. Based on her Doppler findings today I think both bypass grafts are patent. The right transmetatarsal amputation site is healing adequately although the significant swelling and I am reluctant to remove  her staples today. The dry gangrene on the left toes is stable.  Again she has significant swelling on the left.      DATA:   No new data.  MEDICAL ISSUES:   PERIPHERAL VASCULAR DISEASE WITH GANGRENE OF BOTH FEET: She is a month out from her right transmetatarsal amputation.  Because of all the swelling in her feet I do not think we can safely remove her staples today.  We had a long discussion about the importance of leg elevation and the proper positioning  for this.  I like to see her back in 2 weeks and if the swelling has improved I think we could potentially remove her staples.  This can be seen on the PA schedule.  I certainly would not consider left transmetatarsal amputation until she is doing better and the swelling has improved.  She had been followed closely by palliative care and had been told that she really had limited life expectancy.  Kathryn Hancock [facility] [phone]

## 2021-01-01 ENCOUNTER — Emergency Department (HOSPITAL_COMMUNITY): Payer: Medicare Other

## 2021-01-01 ENCOUNTER — Inpatient Hospital Stay (HOSPITAL_COMMUNITY)
Admission: EM | Admit: 2021-01-01 | Discharge: 2021-01-10 | DRG: 299 | Disposition: A | Discharge status: Skilled Nursing Facility | Payer: Medicare Other | Attending: Internal Medicine | Admitting: Internal Medicine

## 2021-01-01 ENCOUNTER — Encounter (HOSPITAL_COMMUNITY): Payer: Self-pay | Admitting: Emergency Medicine

## 2021-01-01 ENCOUNTER — Other Ambulatory Visit: Payer: Self-pay

## 2021-01-01 DIAGNOSIS — N186 End stage renal disease: Secondary | ICD-10-CM | POA: Diagnosis present

## 2021-01-01 DIAGNOSIS — R112 Nausea with vomiting, unspecified: Secondary | ICD-10-CM

## 2021-01-01 DIAGNOSIS — L89320 Pressure ulcer of left buttock, unstageable: Secondary | ICD-10-CM | POA: Diagnosis present

## 2021-01-01 DIAGNOSIS — Z880 Allergy status to penicillin: Secondary | ICD-10-CM

## 2021-01-01 DIAGNOSIS — L89893 Pressure ulcer of other site, stage 3: Secondary | ICD-10-CM | POA: Diagnosis present

## 2021-01-01 DIAGNOSIS — S0010XA Contusion of unspecified eyelid and periocular area, initial encounter: Secondary | ICD-10-CM | POA: Diagnosis present

## 2021-01-01 DIAGNOSIS — H1131 Conjunctival hemorrhage, right eye: Secondary | ICD-10-CM | POA: Diagnosis present

## 2021-01-01 DIAGNOSIS — I120 Hypertensive chronic kidney disease with stage 5 chronic kidney disease or end stage renal disease: Secondary | ICD-10-CM | POA: Diagnosis present

## 2021-01-01 DIAGNOSIS — L03119 Cellulitis of unspecified part of limb: Secondary | ICD-10-CM

## 2021-01-01 DIAGNOSIS — Z7982 Long term (current) use of aspirin: Secondary | ICD-10-CM

## 2021-01-01 DIAGNOSIS — Z7901 Long term (current) use of anticoagulants: Secondary | ICD-10-CM

## 2021-01-01 DIAGNOSIS — Z951 Presence of aortocoronary bypass graft: Secondary | ICD-10-CM

## 2021-01-01 DIAGNOSIS — G8929 Other chronic pain: Secondary | ICD-10-CM | POA: Diagnosis present

## 2021-01-01 DIAGNOSIS — E11649 Type 2 diabetes mellitus with hypoglycemia without coma: Secondary | ICD-10-CM | POA: Diagnosis present

## 2021-01-01 DIAGNOSIS — L89153 Pressure ulcer of sacral region, stage 3: Secondary | ICD-10-CM | POA: Diagnosis present

## 2021-01-01 DIAGNOSIS — R627 Adult failure to thrive: Secondary | ICD-10-CM | POA: Diagnosis present

## 2021-01-01 DIAGNOSIS — L89310 Pressure ulcer of right buttock, unstageable: Secondary | ICD-10-CM | POA: Diagnosis present

## 2021-01-01 DIAGNOSIS — E1152 Type 2 diabetes mellitus with diabetic peripheral angiopathy with gangrene: Secondary | ICD-10-CM | POA: Diagnosis not present

## 2021-01-01 DIAGNOSIS — I96 Gangrene, not elsewhere classified: Secondary | ICD-10-CM

## 2021-01-01 DIAGNOSIS — E1165 Type 2 diabetes mellitus with hyperglycemia: Secondary | ICD-10-CM | POA: Diagnosis present

## 2021-01-01 DIAGNOSIS — E1129 Type 2 diabetes mellitus with other diabetic kidney complication: Secondary | ICD-10-CM | POA: Diagnosis present

## 2021-01-01 DIAGNOSIS — L03116 Cellulitis of left lower limb: Secondary | ICD-10-CM | POA: Diagnosis present

## 2021-01-01 DIAGNOSIS — E785 Hyperlipidemia, unspecified: Secondary | ICD-10-CM | POA: Diagnosis present

## 2021-01-01 DIAGNOSIS — Z8249 Family history of ischemic heart disease and other diseases of the circulatory system: Secondary | ICD-10-CM

## 2021-01-01 DIAGNOSIS — Z8616 Personal history of COVID-19: Secondary | ICD-10-CM

## 2021-01-01 DIAGNOSIS — S90852A Superficial foreign body, left foot, initial encounter: Secondary | ICD-10-CM | POA: Diagnosis present

## 2021-01-01 DIAGNOSIS — Z992 Dependence on renal dialysis: Secondary | ICD-10-CM

## 2021-01-01 DIAGNOSIS — D151 Benign neoplasm of heart: Secondary | ICD-10-CM | POA: Diagnosis present

## 2021-01-01 DIAGNOSIS — M868X7 Other osteomyelitis, ankle and foot: Secondary | ICD-10-CM | POA: Diagnosis present

## 2021-01-01 DIAGNOSIS — E1122 Type 2 diabetes mellitus with diabetic chronic kidney disease: Secondary | ICD-10-CM | POA: Diagnosis present

## 2021-01-01 DIAGNOSIS — I714 Abdominal aortic aneurysm, without rupture, unspecified: Secondary | ICD-10-CM | POA: Diagnosis present

## 2021-01-01 DIAGNOSIS — R1110 Vomiting, unspecified: Secondary | ICD-10-CM

## 2021-01-01 DIAGNOSIS — E1169 Type 2 diabetes mellitus with other specified complication: Secondary | ICD-10-CM | POA: Diagnosis present

## 2021-01-01 DIAGNOSIS — K5900 Constipation, unspecified: Secondary | ICD-10-CM

## 2021-01-01 DIAGNOSIS — Z20822 Contact with and (suspected) exposure to covid-19: Secondary | ICD-10-CM | POA: Diagnosis present

## 2021-01-01 DIAGNOSIS — L89610 Pressure ulcer of right heel, unstageable: Secondary | ICD-10-CM | POA: Diagnosis present

## 2021-01-01 DIAGNOSIS — W458XXA Other foreign body or object entering through skin, initial encounter: Secondary | ICD-10-CM | POA: Diagnosis present

## 2021-01-01 DIAGNOSIS — Z89431 Acquired absence of right foot: Secondary | ICD-10-CM

## 2021-01-01 DIAGNOSIS — Z6824 Body mass index (BMI) 24.0-24.9, adult: Secondary | ICD-10-CM

## 2021-01-01 DIAGNOSIS — Z79899 Other long term (current) drug therapy: Secondary | ICD-10-CM

## 2021-01-01 DIAGNOSIS — I480 Paroxysmal atrial fibrillation: Secondary | ICD-10-CM | POA: Diagnosis present

## 2021-01-01 DIAGNOSIS — K219 Gastro-esophageal reflux disease without esophagitis: Secondary | ICD-10-CM | POA: Diagnosis present

## 2021-01-01 DIAGNOSIS — Z83438 Family history of other disorder of lipoprotein metabolism and other lipidemia: Secondary | ICD-10-CM

## 2021-01-01 DIAGNOSIS — I2510 Atherosclerotic heart disease of native coronary artery without angina pectoris: Secondary | ICD-10-CM | POA: Diagnosis present

## 2021-01-01 DIAGNOSIS — M533 Sacrococcygeal disorders, not elsewhere classified: Secondary | ICD-10-CM

## 2021-01-01 DIAGNOSIS — Z833 Family history of diabetes mellitus: Secondary | ICD-10-CM

## 2021-01-01 DIAGNOSIS — W19XXXA Unspecified fall, initial encounter: Secondary | ICD-10-CM | POA: Diagnosis present

## 2021-01-01 DIAGNOSIS — M898X9 Other specified disorders of bone, unspecified site: Secondary | ICD-10-CM | POA: Diagnosis present

## 2021-01-01 DIAGNOSIS — D638 Anemia in other chronic diseases classified elsewhere: Secondary | ICD-10-CM | POA: Diagnosis present

## 2021-01-01 DIAGNOSIS — R109 Unspecified abdominal pain: Secondary | ICD-10-CM

## 2021-01-01 DIAGNOSIS — Z794 Long term (current) use of insulin: Secondary | ICD-10-CM

## 2021-01-01 DIAGNOSIS — D631 Anemia in chronic kidney disease: Secondary | ICD-10-CM | POA: Diagnosis present

## 2021-01-01 DIAGNOSIS — N2581 Secondary hyperparathyroidism of renal origin: Secondary | ICD-10-CM | POA: Diagnosis present

## 2021-01-01 DIAGNOSIS — L89220 Pressure ulcer of left hip, unstageable: Secondary | ICD-10-CM | POA: Diagnosis present

## 2021-01-01 DIAGNOSIS — F17210 Nicotine dependence, cigarettes, uncomplicated: Secondary | ICD-10-CM | POA: Diagnosis present

## 2021-01-01 DIAGNOSIS — T148XXA Other injury of unspecified body region, initial encounter: Secondary | ICD-10-CM

## 2021-01-01 LAB — CBC WITH DIFFERENTIAL/PLATELET
Abs Immature Granulocytes: 0.29 10*3/uL — ABNORMAL HIGH (ref 0.00–0.07)
Basophils Absolute: 0 10*3/uL (ref 0.0–0.1)
Basophils Relative: 0 %
Eosinophils Absolute: 0.1 10*3/uL (ref 0.0–0.5)
Eosinophils Relative: 0 %
HCT: 33.1 % — ABNORMAL LOW (ref 36.0–46.0)
Hemoglobin: 10.6 g/dL — ABNORMAL LOW (ref 12.0–15.0)
Immature Granulocytes: 2 %
Lymphocytes Relative: 4 %
Lymphs Abs: 0.9 10*3/uL (ref 0.7–4.0)
MCH: 31.7 pg (ref 26.0–34.0)
MCHC: 32 g/dL (ref 30.0–36.0)
MCV: 99.1 fL (ref 80.0–100.0)
Monocytes Absolute: 0.8 10*3/uL (ref 0.1–1.0)
Monocytes Relative: 4 %
Neutro Abs: 17.3 10*3/uL — ABNORMAL HIGH (ref 1.7–7.7)
Neutrophils Relative %: 90 %
Platelets: 292 10*3/uL (ref 150–400)
RBC: 3.34 MIL/uL — ABNORMAL LOW (ref 3.87–5.11)
RDW: 17.9 % — ABNORMAL HIGH (ref 11.5–15.5)
WBC: 19.4 10*3/uL — ABNORMAL HIGH (ref 4.0–10.5)
nRBC: 0 % (ref 0.0–0.2)

## 2021-01-01 LAB — COMPREHENSIVE METABOLIC PANEL
ALT: 10 U/L (ref 0–44)
AST: 13 U/L — ABNORMAL LOW (ref 15–41)
Albumin: 2.5 g/dL — ABNORMAL LOW (ref 3.5–5.0)
Alkaline Phosphatase: 117 U/L (ref 38–126)
Anion gap: 20 — ABNORMAL HIGH (ref 5–15)
BUN: 72 mg/dL — ABNORMAL HIGH (ref 8–23)
CO2: 23 mmol/L (ref 22–32)
Calcium: 9.1 mg/dL (ref 8.9–10.3)
Chloride: 87 mmol/L — ABNORMAL LOW (ref 98–111)
Creatinine, Ser: 8.19 mg/dL — ABNORMAL HIGH (ref 0.44–1.00)
GFR, Estimated: 5 mL/min — ABNORMAL LOW (ref >60)
Glucose, Bld: 110 mg/dL — ABNORMAL HIGH (ref 70–99)
Potassium: 4.7 mmol/L (ref 3.5–5.1)
Sodium: 130 mmol/L — ABNORMAL LOW (ref 135–145)
Total Bilirubin: 0.8 mg/dL (ref 0.3–1.2)
Total Protein: 6.1 g/dL — ABNORMAL LOW (ref 6.5–8.1)

## 2021-01-01 LAB — PROTIME-INR
INR: 1.8 — ABNORMAL HIGH (ref 0.8–1.2)
Prothrombin Time: 21 seconds — ABNORMAL HIGH (ref 11.4–15.2)

## 2021-01-01 LAB — LACTIC ACID, PLASMA: Lactic Acid, Venous: 1.8 mmol/L (ref 0.5–1.9)

## 2021-01-01 LAB — RESP PANEL BY RT-PCR (FLU A&B, COVID) ARPGX2
Influenza A by PCR: NEGATIVE
Influenza B by PCR: NEGATIVE
SARS Coronavirus 2 by RT PCR: NEGATIVE

## 2021-01-01 LAB — LIPASE, BLOOD: Lipase: 29 U/L (ref 11–51)

## 2021-01-01 MED ORDER — SODIUM CHLORIDE 0.9 % IV BOLUS
500.0000 mL | Freq: Once | INTRAVENOUS | Status: AC
  Administered 2021-01-01: 500 mL via INTRAVENOUS

## 2021-01-01 MED ORDER — VANCOMYCIN HCL 750 MG/150ML IV SOLN
750.0000 mg | INTRAVENOUS | Status: DC
  Administered 2021-01-02: 750 mg via INTRAVENOUS
  Filled 2021-01-01: qty 150

## 2021-01-01 MED ORDER — METOCLOPRAMIDE HCL 5 MG/ML IJ SOLN: 5.0000 mg | Freq: Once | INTRAMUSCULAR | Status: DC

## 2021-01-01 MED ORDER — SODIUM CHLORIDE 0.9 % IV SOLN
1.0000 g | INTRAVENOUS | Status: DC
  Administered 2021-01-02 – 2021-01-05 (×4): 1 g via INTRAVENOUS
  Filled 2021-01-01 (×5): qty 1

## 2021-01-01 MED ORDER — VANCOMYCIN HCL 1500 MG/300ML IV SOLN
1500.0000 mg | Freq: Once | INTRAVENOUS | Status: AC
  Administered 2021-01-01: 1500 mg via INTRAVENOUS
  Filled 2021-01-01: qty 300

## 2021-01-01 MED ORDER — ONDANSETRON HCL 4 MG/2ML IJ SOLN
4.0000 mg | Freq: Once | INTRAMUSCULAR | Status: AC
  Administered 2021-01-01: 4 mg via INTRAVENOUS
  Filled 2021-01-01: qty 2

## 2021-01-01 MED ORDER — SODIUM CHLORIDE 0.9 % IV SOLN
2.0000 g | Freq: Once | INTRAVENOUS | Status: AC
  Administered 2021-01-01: 2 g via INTRAVENOUS
  Filled 2021-01-01: qty 2

## 2021-01-01 NOTE — ED Triage Notes (Signed)
Pt to triage via EMS from [name].  Reports nausea, vomiting, and generalized weakness.  Only had 1 hr of dialysis on Friday.  C/o pain to L foot/toes with infection.  Recent amputation to R toes.  Bandages to bilateral feet.

## 2021-01-01 NOTE — ED Provider Notes (Signed)
Emergency Medicine Provider Triage Evaluation Note  Kathryn Hancock , a 70 y.o. female  was evaluated in triage.  Pt complains of nausea, vomiting, generalized weakness and left foot infection.  Patient states for the past couple days, she has had worsening nausea, vomiting, weakness.  She recently had an amputation of her right foot, Dr. Doren Custard has stated that she will also need amputation of the left foot.  She states the other day there is blood coming from her left foot. No fevers  Review of Systems  Positive: N/v, weakness, L foot bleeding and pain Negative: fever  Physical Exam  BP (!) 142/63   Pulse 73   Temp 98.5 F (36.9 C) (Oral)   Resp 16   SpO2 100%  Gen:   Awake, no distress   Resp:  Normal effort  MSK:   Moves extremities without difficulty  Other:  No ttp of the abd. L foot with gangrenous 1st and 2nd toes. Erythema and warmth of the L foot. Pitting edema bilaterally.   Medical Decision Making  Medically screening exam initiated at 2:18 PM.  Appropriate orders placed.  Kathryn Hancock was informed that the remainder of the evaluation will be completed by another provider, this initial triage assessment does not replace that evaluation, and the importance of remaining in the ED until their evaluation is complete.  Labs, [name], PA-C 01/01/21 1431    [name], MD 01/03/21 [phone]

## 2021-01-01 NOTE — ED Provider Notes (Signed)
[facility] EMERGENCY DEPARTMENT Provider Note   CSN: [id number] Arrival date & time: 01/01/21  1348     History Weakness, emesis   Kathryn Hancock is a 70 y.o. female with past medical history significant for AAA, chronic anemia, CAD, diabetes, gangrene, ESRD, last dialysis episode on Friday who presents for evaluation of multiple complaints.  Generalized weakness.  Per daughter patient's not been able to keep down anything despite Zofran and Reglan at home.  She is having dry heaves as well as bilious emesis.  No diarrhea.  No abdominal pain.  Is having more bruising than normal.  Was seen approximately 1 week ago for right subconjunctival hemorrhage.  Seen by [facility] on Friday who recommended ED eval for imaging to rule out hematoma.  Per daughter patient has had some right periorbital ecchymosis.  No known traumatic injuries.  Patient also with worsening wounds to her lower extremity and her sacrum.  Right transmetatarsal amputation with Dr. Doren Custard.  Had some significant swelling to her lower extremities and did not have staples removed due to swelling.  Some decreased vision however patient states this hasDaughter states they have noted some drainage surrounding her staples.  They have also noted her gangrene to her left lower extremity has had worsening redness and there is now bleeding and some purulent drainage.  He is also having some drainage from her known infected right upper thigh wounds however no surrounding erythema or warmth.  Patient feels generally weak.  Does still make urine, once daily typically in the morning.  No urinary symptoms.  No chest pain, shortness of breath, fever, chills.  Denies additional aggravating or alleviating factors  Last Dialysis Friday>>> 1 hour  [place], Friday Apt [facility]- [facility] A[facility]  History of obtained from patient and past medical records.  No interpreter used  HPI     Past Medical History:  Diagnosis  Date   AAA (abdominal aortic aneurysm) ([place]) 06/2019   Multiple small pseudoaneurysmal projections of the dominant aorta.  Distal abdominal aortic aneurysm 4.5 x 4.7 cm.  Greatest AP dimension of the infrarenal aorta is 4.9.  No evidence of thoracic aortic aneurysm or dissection.  Brief segment of proximal IMA occlusion.   Anemia    Coronary artery disease involving native coronary artery of native heart without angina pectoris 06/01/2020   Cardiac cath at [facility] 06/01/2020-preop for atrial myxoma resection -> severe proximal RCA and PDA -> had single-vessel CABG with SVG-PDA along with myxoma resection.   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 05/27/2020   ESRD (end stage renal disease) on dialysis ([place]) 06/2020   Progression of CKDIIIb to ESRD initially related to thromboembolic event from left atrial myxoma; complicated by perioperative hypotension-; now 1 on HD TU/TH/SAT @ [place]   GERD (gastroesophageal reflux disease)    Heavy smoker (more than 20 cigarettes per day)    ~ 2 ppd; since age 80 (40 pk yr) = has cut down to one half PPD.>   Hyperlipidemia associated with type 2 diabetes mellitus ([place]) 08/05/2020   Hypertension    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 = Status post resection December 23,2021 (done at [facility] because of no bed availability at [facility]   Microscopic hematuria    PAF (paroxysmal atrial fibrillation) ([place]) 07/01/2020  Initially noted postoperatively-left atrial myxoma resection and CABG x1.  Now on amiodarone and apixaban..   Peripheral vascular disease ([place])    Bilateral SFA & ATA occlusion.  2 V runoffvia Peroneal A & PTA. 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   Limb ischemia 11/25/2020   Pressure injury of skin 11/01/2020   Iron deficiency anemia, unspecified 10/22/2020   Malnutrition of moderate degree 09/30/2020   Current use of long term anticoagulation    Weakness    Anemia 09/29/2020   Personal history of COVID-19 09/28/2020   COVID-19 08/22/2020   Hyperlipidemia associated with type 2 diabetes mellitus ([place]) 08/05/2020   Hypokalemia 07/27/2020   Unspecified abnormal findings in urine 07/26/2020   Unspecified severe protein-calorie malnutrition ([place]) 07/21/2020   Encounter for immunization 07/19/2020   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[place]) 07/16/2020   Coagulation defect, unspecified ([place]) 07/16/2020   Allergy, unspecified, initial encounter 07/15/2020   Anaphylactic shock, unspecified, initial encounter 07/15/2020   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   Pain, unspecified 07/15/2020   Pruritus, unspecified 07/15/2020   Secondary hyperparathyroidism of renal origin ([place]) 07/15/2020   Shortness of breath 07/15/2020   PAOD (peripheral arterial occlusive disease) ([place]) 07/08/2020   ESRD (end stage renal disease) on dialysis ([place]) 07/01/2020   PAF (paroxysmal atrial fibrillation) (HCC) -  CHA2DS2-VASc Score 5 (Age, Female, HTN, DM, Vascular)- On Eliquis 07/01/2020   Anemia of chronic disease 07/01/2020   Essential hypertension 07/01/2020   DM (diabetes mellitus), type 2 with renal complications ([place]) 42/59/5638   Cellulitis of toe 06/05/2020   S/P CABG (coronary artery bypass graft) 06/02/2020   Coronary artery disease involving native coronary artery of native heart without angina pectoris 06/01/2020   CKD (chronic kidney disease) stage 3, GFR 30-59 ml/min ([place]) 06/01/2020   HLD (hyperlipidemia) 06/01/2020   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 Known Para-Ren  Aneurysm. Bilat Com, Internal & External Iliacs - CFA& DFA patent.  Bilat SFA 100% @ origin - recon @ AK Pop A.; Bilateral 2 V runnoff - Bilateral Peroneal A & PTA patent w/ Bilat ATA CTO.   AV FISTULA PLACEMENT Left 07/11/2020   Procedure: LEFT FIRST STAGE BRACHIOBASILIC UPPER EXTREMITY ARTERIOVENOUS (AV) FISTULA CREATION;  Surgeon: [name], MD;  Location: [place];  Service: Vascular;  Laterality: Left;   BASCILIC VEIN TRANSPOSITION Left 10/03/2020   Procedure: BASILIC VEIN TRANSPOSITION SECOND STAGE LEFT;  Surgeon: Angelia Mould, MD;  Location: [place];  Service: Vascular;  Laterality: Left;   BIOPSY  10/01/2020   Procedure: BIOPSY;  Surgeon: Jackquline Denmark, MD;  Location: [facility] ENDOSCOPY;  Service: Endoscopy;;   Cardiac MRI  01/22/2020   Normal LV size and function.  Moderate focal basal septal hypertrophy.  No S.A.M.  LVEF 61%.  RVEF 66%.  Large mobile  mass in the left atrium attached to the interatrial septum-does not appear to thrombus characteristics consistent with left atrial myxoma.   Chest CTA  06/2019   Multiple small pseudoaneurysmal projections of the dominant aorta.  Distal abdominal aortic aneurysm 4.5 x 4.7 cm.  Greatest AP dimension of the infrarenal aorta is 4.9.  No evidence of thoracic aortic aneurysm or dissection.  Brief segment of proximal IMA occlusion.  Large geographic filling defect of the left atrium with associated dense radiopaque material.  Aortic atherosclerosis and emphysema   COLONOSCOPY W/ POLYPECTOMY     CORONARY ARTERY BYPASS GRAFT  06/02/2020   @ [facility] -  Dr. Norm Parcel; SVG-rPDA along with Atrial Myxoma Resection.   ESOPHAGOGASTRODUODENOSCOPY (EGD) WITH PROPOFOL N/A 10/01/2020   Procedure: ESOPHAGOGASTRODUODENOSCOPY (EGD) WITH PROPOFOL;  Surgeon: Jackquline Denmark, MD;  Location: [facility] ENDOSCOPY;  Service: Endoscopy;  Laterality: N/A;   EYE SURGERY Bilateral    Cataract surgery   FEMORAL-POPLITEAL BYPASS GRAFT Left 09/12/2020   Procedure: LEFT FEMORAL-ABOVE KNEE POPLITEAL ARTERY BYPASS GRAFT;  Surgeon: Angelia Mould, MD;  Location: [place];  Service: Vascular;  Laterality: Left;   FEMORAL-POPLITEAL BYPASS GRAFT Right 10/31/2020   Procedure: RIGHT BYPASS GRAFT FEMORAL- ABOVE KNEE POPLITEAL ARTERY;  Surgeon: Angelia Mould, MD;  Location: [place];  Service: Vascular;  Laterality: Right;   HOT HEMOSTASIS N/A 10/01/2020   Procedure: HOT HEMOSTASIS (ARGON PLASMA COAGULATION/BICAP);  Surgeon: Jackquline Denmark, MD;  Location: [name] END[facility]COPY;  Service: Endoscopy;  Laterality: N/A;   I & D EXTREMITY Right 11/28/2020   Procedure: IRRIGATION AND DEBRIDEMENT RIGHT UPPER LEG WOUND;  Surgeon: Angelia Mould, MD;  Location: [facility] OR;  Service: Vascular;  Laterality: Right;   INTRAOPERATIVE TRANSESOPHAGEAL ECHOCARDIOGRAM  06/02/2020   [facility] (LA Myxoma Resection & CABG x 1): Pre-op EF> 55%. Normal WM. Mild AI. Large LA mass with calcifications &minimal vascularity (5.92 x 4 cm @ largest. LA dilation. Diffuse atherosclerosis of descending Ao, focal calcification of Sinus of  Valsalva. -> Post-op. S/p Mass Excision - no residual mass in LA. No ASD or PFO.  EF >55% no WMA.   IR FLUORO GUIDE CV LINE RIGHT  07/06/2020   IR US GUIDE VASC ACCESS RIGHT  07/06/2020   LEFT ATRIAL MYXOMA RESECTION Left 06/02/2020   [facility]: Dr. Norm Parcel   LEFT HEART CATH AND CORONARY ANGIOGRAPHY  05/2020   [facility]: Biplane Coronary Angiography, Significant Prox RCA & rPDA disease. Normal CO/CI with elevated LVEDP.   TRANSESOPHAGEAL ECHOCARDIOGRAM  05/31/2020   [facility]:  Normal LV function.  No LAA  thrombus.  Large mobile left atrial mass attached to the left atrial septum (4.8 x 3.1 cm with calcification)-most consistent with atrial myxoma.  EF estimated 55%.  Normal valves.   TRANSMETATARSAL AMPUTATION Right 11/28/2020   Procedure: RIGHT TRANSMETATARSAL AMPUTATION;  Surgeon: Angelia Mould, MD;  Location: [facility] OR;  Service: Vascular;  Laterality: Right;   TRANSTHORACIC ECHOCARDIOGRAM  07/2019    EF 60 to 65%.  GR 1 DD.  Elevated LVEDP.  Large size ill-defined echodensity in the left atrium suspicious for thrombus versus tumor.  Moderately dilated LA.   TRANSTHORACIC ECHOCARDIOGRAM  08/31/2020   (Postop left atrial myxoma resection) normal LVEF of 55-60%.  No R WMA.  GR 1 DD.  Mildly reduced RV function with normal PAP.  Trivial AI.  Left atrial myxoma no longer present.  Mitral valve stable trivial MR.   TUBAL LIGATION       OB History   No obstetric history on  file.     Family History  Problem Relation Age of Onset   Diabetes Mother    Diabetes Father    Heart attack Father 56   CAD Father    Hyperlipidemia Father    Hypertension Father     Social History   Tobacco Use   Smoking status: Every Day    Packs/day: 0.50    Years: 50.00    Pack years: 25.00    Types: Cigarettes   Smokeless tobacco: Never  Vaping Use   Vaping Use: Never used  Substance Use Topics   Alcohol use: Not Currently   Drug use: Never    Home Medications Prior to Admission medications   Medication Sig Start Date End Date Taking? Authorizing Provider  acetaminophen (TYLENOL) 650 MG CR tablet Take 1,300 mg by mouth every 8 (eight) hours as needed for pain.    [provider]  amiodarone (PACERONE) 200 MG tablet Take 200 mg by mouth every evening. 06/10/20 06/10/21  [provider]  apixaban (ELIQUIS) 5 MG TABS tablet Take 1 tablet (5 mg total) by mouth 2 (two) times daily. 12/10/19   Leonie Man, MD  Ascorbic Acid (VITAMIN C) 1000 MG tablet Take 1,000 mg by mouth daily.     [provider]  aspirin EC 81 MG tablet Take 81 mg by mouth every evening. Swallow whole.    [provider]  atorvastatin (LIPITOR) 20 MG tablet Take 20 mg by mouth every evening.    [provider]  bisacodyl (DULCOLAX) 5 MG EC tablet Take 1 tablet (5 mg total) by mouth daily as needed for moderate constipation. 12/10/20   Mercy Riding, MD  Darbepoetin Alfa (ARANESP) 200 MCG/0.4ML SOSY injection Inject 0.4 mLs (200 mcg total) into the vein every Monday with hemodialysis. 12/12/20   Mercy Riding, MD  docusate sodium (COLACE) 100 MG capsule Take 100 mg by mouth daily as needed for mild constipation.    [provider]  famotidine (PEPCID) 20 MG tablet Take 1 tablet (20 mg total) by mouth 2 (two) times daily. 12/13/20   Geradine Girt, DO  fluticasone (FLONASE) 50 MCG/ACT nasal spray Place 2 sprays into both nostrils as needed for allergies or rhinitis.    [provider]  folic acid (FOLVITE) 474 MCG tablet Take 800 mcg by mouth daily.    [provider]  glucose blood test strip 1 each by Other route as needed. Use as instructed    [provider]  hydrocortisone (ANUSOL-HC) 25 MG suppository Place 1 suppository (25 mg total) rectally 2 (two) times daily. 12/10/20   Mercy Riding, MD  HYDROmorphone (DILAUDID) 4 MG tablet Take 1 tablet (4 mg total) by mouth every 4 (four) hours as needed for moderate pain (Patient is to receive TWO 4mg  tablet PO of Dilaudid ONE hour before dressing changes.). 12/12/20   Geradine Girt, DO  insulin aspart (NOVOLOG) 100 UNIT/ML injection Inject 0-6 Units into the skin 3 (three) times daily with meals. CBG < 70: Implement Hypoglycemia Standing Orders and refer to Hypoglycemia Standing Orders sidebar report CBG 70 - 120: 0 units CBG 121 - 150: 0 units CBG 151 - 200: 1 unit CBG 201-250: 2 units CBG 251-300: 3 units CBG 301-350: 4 units CBG 351-400: 5 units CBG > 400: Give 6 units and call MD 12/10/20   Mercy Riding,  MD  lidocaine-prilocaine (EMLA) cream Apply 1 application topically daily as needed (dialysis). 08/12/20  [provider]  linagliptin (TRADJENTA) 5 MG TABS tablet Take 5 mg by mouth every evening.    [provider]  loperamide (IMODIUM A-D) 2 MG tablet Take 2 mg by mouth 4 (four) times daily as needed for diarrhea or loose stools.    [provider]  metoCLOPramide (REGLAN) 5 MG tablet Take 1 tablet (5 mg total) by mouth every 8 (eight) hours as needed for refractory nausea / vomiting. 12/10/20 12/10/21  Mercy Riding, MD  montelukast (SINGULAIR) 10 MG tablet Take 10 mg by mouth daily as needed (allergies).    [provider]  multivitamin (RENA-VIT) TABS tablet Take 1 tablet by mouth at bedtime. 12/10/20   Mercy Riding, MD  Nutritional Supplements (,FEEDING SUPPLEMENT, PROSOURCE PLUS) liquid Take 30 mLs by mouth 3 (three) times daily between meals. 12/10/20   Mercy Riding, MD  ondansetron (ZOFRAN) 4 MG tablet Take 1 tablet (4 mg total) by mouth daily as needed for up to 365 doses for nausea or vomiting. 12/10/20   Mercy Riding, MD  sevelamer carbonate (RENVELA) 800 MG tablet Take 1 tablet (800 mg total) by mouth 3 (three) times daily with meals. 12/10/20   Mercy Riding, MD  traZODone (DESYREL) 50 MG tablet Take 1 tablet (50 mg total) by mouth at bedtime as needed for sleep. 12/10/20   Mercy Riding, MD    Allergies    Penicillins  Review of Systems   Review of Systems  Constitutional:  Positive for activity change, appetite change and fatigue.  HENT: Negative.    Eyes:  Positive for redness and visual disturbance.  Respiratory: Negative.    Cardiovascular: Negative.   Gastrointestinal:  Positive for nausea and vomiting. Negative for abdominal distention, abdominal pain, anal bleeding, blood in stool, constipation, diarrhea and rectal pain.  Genitourinary: Negative.   Skin:  Positive for color change, rash and wound.  Neurological:  Positive for weakness.  All  other systems reviewed and are negative.  Physical Exam Updated Vital Signs BP (!) 154/62   Pulse 64   Temp 98.5 F (36.9 C) (Oral)   Resp 15   SpO2 99%   Physical Exam Vitals and nursing note reviewed.  Constitutional:      General: She is not in acute distress.    Appearance: She is well-developed. She is ill-appearing.  HENT:     Head: Raccoon eyes present.     Jaw: There is normal jaw occlusion.     Comments: Right periorbital ecchymosis    Mouth/Throat:     Lips: Pink.  Eyes:     Extraocular Movements: Extraocular movements intact.     Conjunctiva/sclera:     Right eye: Chemosis and hemorrhage present.     Pupils: Pupils are equal, round, and reactive to light.     Comments: EOM intact  Neck:     Trachea: Phonation normal.     Comments: No neck stiffness Cardiovascular:     Rate and Rhythm: Normal rate.     Pulses:          Radial pulses are 2+ on the right side and 2+ on the left side.       Dorsalis pedis pulses are detected w/ Doppler on the right side and detected w/ Doppler on the left side.  Pulmonary:     Effort: Pulmonary effort is normal. No respiratory distress.     Breath sounds: Normal breath sounds and air entry.     Comments: Clear  bilaterally Chest:     Comments: Equal rise and fall to chest wall, ecchymosis to right lateral posterior chest, flank nontender.  No crepitus or step.  Dialysis line to right upper chest wall without surrounding erythema, warmth or drainage Abdominal:     General: Bowel sounds are normal. There is no distension.     Palpations: Abdomen is soft.     Tenderness: There is no abdominal tenderness.     Comments: Soft, nontender without rebound or guarding  Musculoskeletal:        General: Normal range of motion.     Cervical back: Full passive range of motion without pain and normal range of motion.     Comments: Right transmetatarsal amputation to right lower extremity, staples in place with some mild drainage and  surrounding erythema, left gangrene to metatarsals, bleeding, drainage, some surrounding erythema. Dialysis graft to LUE with thrill.  Skin:    Findings: Bruising, ecchymosis, signs of injury and wound present.     Comments: Significant areas of necrosis to sacral wounds.  Picture in chart.  Neurological:     General: No focal deficit present.     Mental Status: She is alert.     Cranial Nerves: Cranial nerves are intact.     Motor: Motor function is intact.     Comments: Not ambulating due to feet pain  Psychiatric:        Mood and Affect: Mood normal.                        ED Results / Procedures / Treatments   Labs (all labs ordered are listed, but only abnormal results are displayed) Labs Reviewed  CBC WITH DIFFERENTIAL/PLATELET - Abnormal; Notable for the following components:      Result Value   WBC 19.4 (*)    RBC 3.34 (*)    Hemoglobin 10.6 (*)    HCT 33.1 (*)    RDW 17.9 (*)    Neutro Abs 17.3 (*)    Abs Immature Granulocytes 0.29 (*)    All other components within normal limits  COMPREHENSIVE METABOLIC PANEL - Abnormal; Notable for the following components:   Sodium 130 (*)    Chloride 87 (*)    Glucose, Bld 110 (*)    BUN 72 (*)    Creatinine, Ser 8.19 (*)    Total Protein 6.1 (*)    Albumin 2.5 (*)    AST 13 (*)    GFR, Estimated 5 (*)    Anion gap 20 (*)    All other components within normal limits  PROTIME-INR - Abnormal; Notable for the following components:   Prothrombin Time 21.0 (*)    INR 1.8 (*)    All other components within normal limits  RESP PANEL BY RT-PCR (FLU A&B, COVID) [id number]  CULTURE, BLOOD (ROUTINE X 2)  CULTURE, BLOOD (ROUTINE X 2)  LIPASE, BLOOD  LACTIC ACID, PLASMA  URINALYSIS, ROUTINE W REFLEX MICROSCOPIC    EKG EKG Interpretation  Date/Time:  Sunday January 01 2021 14:05:39 EDT Ventricular Rate:  72 PR Interval:  184 QRS Duration: 92 QT Interval:  408 QTC Calculation: 446 R Axis:   47 Text  Interpretation: Normal sinus rhythm Cannot rule out Anterior infarct , age undetermined Abnormal ECG No significant change since last tracing Confirmed by Isla Pence [phone]) on 01/01/2021 7:46:21 PM  Radiology DG Chest 2 View  Result Date: 01/01/2021 CLINICAL DATA:  weakness EXAM: CHEST - 2 VIEW  COMPARISON:  November 25, 2020 FINDINGS: The cardiomediastinal silhouette is unchanged in contour.Status post median sternotomy and CABG. Atherosclerotic calcifications of the tortuous thoracic aorta. RIGHT chest CVC tip terminates over the superior cavoatrial junction. No pleural effusion. No pneumothorax. No acute pleuroparenchymal abnormality. Visualized abdomen is unremarkable. Mild degenerative changes of the thoracic spine. IMPRESSION: No acute cardiopulmonary abnormality. Electronically Signed   By: Valentino Saxon MD   On: 01/01/2021 15:01   CT Head Wo Contrast  Result Date: 01/01/2021 CLINICAL DATA:  Weakness, vomiting, sacral pain and right Peri orbital ecchymosis. EXAM: CT HEAD WITHOUT CONTRAST TECHNIQUE: Contiguous axial images were obtained from the base of the skull through the vertex without intravenous contrast. COMPARISON:  None. FINDINGS: Brain: There is mild cerebral atrophy with widening of the extra-axial spaces and ventricular dilatation. There are areas of decreased attenuation within the white matter tracts of the supratentorial brain, consistent with microvascular disease changes. Small, chronic bilateral basal ganglia lacunar infarcts are seen. Vascular: No hyperdense vessel or unexpected calcification. Skull: Normal. Negative for acute fracture. Sinuses/Orbits: No acute finding. Other: None. IMPRESSION: 1. Generalized cerebral atrophy. 2. No acute intracranial abnormality. Electronically Signed   By: Virgina Norfolk M.D.   On: 01/01/2021 22:13   DG Foot Complete Left  Result Date: 01/01/2021 CLINICAL DATA:  weakness EXAM: LEFT FOOT - COMPLETE 3+ VIEW COMPARISON:  None. FINDINGS:  No acute fracture or dislocation. Minimal degenerative changes of the first MTP. No area of erosion or osseous destruction. There is a 3 mm linear radiopaque density in the plantar soft tissues between the third and fourth proximal phalanges. Soft tissue edema. IMPRESSION: 1. There is a 3 mm linear radiopaque density projecting over the soft tissues of the plantar aspect between the third and fourth phalanges. This may reflect a retained foreign body. 2. No radiographic evidence of osteomyelitis. Electronically Signed   By: Valentino Saxon MD   On: 01/01/2021 15:00   CT CHEST ABDOMEN PELVIS WO CONTRAST  Result Date: 01/01/2021 CLINICAL DATA:  Weakness. Emesis. Complaining of sacral pain. Flank pain. Reported bruising to the right chest wall and abdomen. Sacral wounds. EXAM: CT CHEST, ABDOMEN AND PELVIS WITHOUT CONTRAST TECHNIQUE: Multidetector CT imaging of the chest, abdomen and pelvis was performed following the standard protocol without IV contrast. COMPARISON:  07/01/2020 FINDINGS: CT CHEST FINDINGS Cardiovascular: Prior CABG surgery. Heart normal in size. Three-vessel coronary artery calcifications. No pericardial effusion. Aortic atherosclerosis. No aneurysm. Right sided dual lumen central catheter, tip in the lower superior vena cava. Mediastinum/Nodes: No enlarged mediastinal, hilar, or axillary lymph nodes. Thyroid gland, trachea, and esophagus demonstrate no significant findings. Lungs/Pleura: Minimal subsegmental atelectasis in the dependent lower lobes. Lungs otherwise clear. No pleural effusion or pneumothorax. Musculoskeletal: No fracture or acute finding.  No bone lesion. CT ABDOMEN PELVIS FINDINGS Hepatobiliary: Normal liver. Dependent gallstones. No gallbladder wall thickening or evidence of acute cholecystitis. No bile duct dilation. Pancreas: Unremarkable. No pancreatic ductal dilatation or surrounding inflammatory changes. Spleen: Normal in size without focal abnormality. Adrenals/Urinary  Tract: Bilateral adrenal gland thickening consistent with hyperplasia, stable. Kidneys normal in position. No renal masses. No collecting system stones and no hydronephrosis. Normal ureters. Normal bladder. Stomach/Bowel: Stomach is within normal limits. Appendix appears normal. No evidence of bowel wall thickening, distention, or inflammatory changes. Vascular/Lymphatic: Irregular infrarenal abdominal aortic aneurysm, maximum 5.7 x 5.3 cm transversely, without significant change from the prior study. There are multiple aortic outpouchings that are also stable. No enlarged lymph nodes. Reproductive: Uterus and bilateral adnexa are  unremarkable. Other: No abdominal wall hernia or abnormality. No abdominopelvic ascites. Musculoskeletal: Limited soft tissue covering lower sacrum and coccyx consistent with an overlying sacral decubitus ulcer. No evidence of an abscess. No bone resorption to suggest osteomyelitis. No fractures or bone lesions. There is soft tissue inflammation lateral to the left proximal femur and hip. IMPRESSION: 1. No acute findings in the chest. 2. Limited soft tissue overlying the lower sacrum and coccyx consistent with a sacral decubitus ulcer, but without evidence of an abscess or underlying osteomyelitis. 3. Soft tissue inflammation lateral to the left hip, nonspecific. No defined ulcer or evidence of an abscess. 4. No acute findings within the abdomen or pelvis. 5. Abdominal aortic aneurysm with multiple accessory outpatient chains along the aorta. Current aorta measures a maximum of 5.6 x 5.3 cm transversely, 5.3 cm anterior-posterior, previously 4.9 cm measured in the same location. Recommend follow-up CT/MR every 6 months and vascular consultation. This recommendation follows ACR consensus guidelines: White Paper of the ACR Incidental Findings Committee II on Vascular Findings. J Am Coll Radiol 2013; 10:789-794. Electronically Signed   By: Lajean Manes M.D.   On: 01/01/2021 22:27   CT  Maxillofacial Wo Contrast  Result Date: 01/01/2021 CLINICAL DATA:  Facial trauma. EXAM: CT MAXILLOFACIAL WITHOUT CONTRAST TECHNIQUE: Multidetector CT imaging of the maxillofacial structures was performed. Multiplanar CT image reconstructions were also generated. COMPARISON:  None. FINDINGS: Osseous: No fracture or mandibular dislocation. No destructive process. Orbits: Negative. No traumatic or inflammatory intraorbital finding. Sinuses: Clear. Soft tissues: Mild right periorbital soft tissue swelling is noted with an associated 2.1 cm x 1.2 cm predominantly inferior-medial right preseptal hematoma. A 5 mm x 2 mm focus of preseptal air is also seen. Limited intracranial: No significant or unexpected finding. IMPRESSION: 1. Mild RIGHT periorbital soft tissue swelling with an associated small RIGHT preseptal hematoma. Electronically Signed   By: Virgina Norfolk M.D.   On: 01/01/2021 22:22    Procedures .Critical Care  Date/Time: 01/01/2021 11:45 PM Performed by: Nettie Elm, PA-C Authorized by: Nettie Elm, PA-C   Critical care provider statement:    Critical care time (minutes):  45   Critical care was necessary to treat or prevent imminent or life-threatening deterioration of the following conditions:  Circulatory failure   Critical care was time spent personally by me on the following activities:  Discussions with consultants, evaluation of patient's response to treatment, examination of patient, ordering and performing treatments and interventions, ordering and review of laboratory studies, ordering and review of radiographic studies, pulse oximetry, re-evaluation of patient's condition, obtaining history from patient or surrogate and review of old charts   Medications Ordered in ED Medications  ceFEPIme (MAXIPIME) 1 g in sodium chloride 0.9 % 100 mL IVPB (has no administration in time range)  vancomycin (VANCOREADY) IVPB 750 mg/150 mL (has no administration in time range)   metoCLOPramide (REGLAN) injection 5 mg (0 mg Intravenous Hold 01/01/21 2135)  sodium chloride 0.9 % bolus 500 mL (0 mLs Intravenous Stopped 01/01/21 2215)  ondansetron (ZOFRAN) injection 4 mg (4 mg Intravenous Given 01/01/21 2057)  ceFEPIme (MAXIPIME) 2 g in sodium chloride 0.9 % 100 mL IVPB (0 g Intravenous Stopped 01/01/21 2135)  vancomycin (VANCOREADY) IVPB 1500 mg/300 mL (1,500 mg Intravenous New Bag/Given 01/01/21 2137)    ED Course  I have reviewed the triage vital signs and the nursing notes.  Pertinent labs & imaging results that were available during my care of the patient were reviewed by me and considered  in my medical decision making (see chart for details).  Here for evaluation of intractable emesis.  Patient chronically ill.  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  She has some drainage to her wounds.  Multiple medications at home with persistent emesis.  Abdomen soft, nontender.  She has right periorbital ecchymosis and subconjunctival hemorrhage with chemosis.  Full range of motion to eyes.  She denies any pain to the actual eye itself.  She was seen by ophthalmology who wanted imaging done on Friday.  Her wounds do appear to be worsening. From last vascular picture a few days ago.  We will plan on antibiotics, she does appear very dry.  Will give small fluid bolus.  Labs and imaging personally reviewed and interpreted:  CBC with leukocytosis at 19.4, Hgb 10.6 at baseline CMP sodium 130, glucose 110, creatinine 8.19, prior 6.62, 4.0 Lactic 1.8 Lipase 29 DG foot without osteo DG chest without pulm edema INR 1.8 CT head without acute abnormality CT max face with preseptal hematoma CT AP with known aneurysm, does show sacral ulcers consistent with exam.  Patient given IVF, abx for cellulitis with known gangrene. Would not be surprised if patient has osteomyelitis to her LLE.  Will be  admitted for IV fluids, antibiotics, intractable emesis.  CONSULT with Hal Hope with [facility] how agrees to evaluation patient for admission   Patient does not need emergent dialysis  Patient has been seen and evaluated by attending Dr. Gilford Raid who is in agreement with above treatment, plan and dispo   The patient appears reasonably stabilized for admission considering the current resources, flow, and capabilities available in the ED at this time, and I doubt any other [facility] requiring further screening and/or treatment in the ED prior to admission.     MDM Rules/Calculators/A&P                            Final Clinical Impression(s) / ED Diagnoses Final diagnoses:  Emesis  Flank pain  Sacral pain  Intractable nausea and vomiting  Cellulitis of lower extremity, unspecified laterality  Gangrene ([place])  Hematoma  Bruising  ESRD (end stage renal disease) ([place])    Rx / DC Orders ED Discharge Orders     None        Clois Montavon A, PA-C 01/01/21 2346    Isla Pence, MD 01/09/21 0825

## 2021-01-01 NOTE — Progress Notes (Signed)
Pharmacy Antibiotic Note  Kathryn Hancock is a 70 y.o. female admitted on 01/01/2021 presenting with generalized weakness, left lower extremity purulent drainage and also from thigh wounds.  Pharmacy has been consulted for cefepime and vancomycin dosing.  ESRD-HD MWF with last on Friday  Plan: Vancomycin 1500mg  IV x 1, then 750 mg IV qHD Cefepime 2g IV x 1, then 1g IV q 24h Monitor HD schedule, Cx and clinical progression to narrow Vancomycin random level as needed     Temp (24hrs), Avg:98.5 F (36.9 C), Min:98.5 F (36.9 C), Max:98.5 F (36.9 C)  Recent Labs  Lab 01/01/21 1434  WBC 19.4*  CREATININE 8.19*  LATICACIDVEN 1.8    Estimated Creatinine Clearance: 6 mL/min (A) (by C-G formula based on SCr of 8.19 mg/dL (H)).    Allergies  Allergen Reactions   Penicillins Anaphylaxis    Anaphylaxis as a child    Bertis Ruddy, PharmD Clinical Pharmacist ED Pharmacist Phone # [phone] 01/01/2021 7:26 PM

## 2021-01-02 ENCOUNTER — Encounter (HOSPITAL_COMMUNITY): Payer: Self-pay | Admitting: Internal Medicine

## 2021-01-02 ENCOUNTER — Observation Stay (HOSPITAL_COMMUNITY): Payer: Medicare Other

## 2021-01-02 DIAGNOSIS — Z951 Presence of aortocoronary bypass graft: Secondary | ICD-10-CM | POA: Diagnosis not present

## 2021-01-02 DIAGNOSIS — L8990 Pressure ulcer of unspecified site, unspecified stage: Secondary | ICD-10-CM

## 2021-01-02 DIAGNOSIS — L03119 Cellulitis of unspecified part of limb: Secondary | ICD-10-CM | POA: Diagnosis not present

## 2021-01-02 DIAGNOSIS — D631 Anemia in chronic kidney disease: Secondary | ICD-10-CM | POA: Diagnosis present

## 2021-01-02 DIAGNOSIS — Z20822 Contact with and (suspected) exposure to covid-19: Secondary | ICD-10-CM | POA: Diagnosis present

## 2021-01-02 DIAGNOSIS — L89320 Pressure ulcer of left buttock, unstageable: Secondary | ICD-10-CM | POA: Diagnosis present

## 2021-01-02 DIAGNOSIS — Z7189 Other specified counseling: Secondary | ICD-10-CM | POA: Diagnosis not present

## 2021-01-02 DIAGNOSIS — Z515 Encounter for palliative care: Secondary | ICD-10-CM | POA: Diagnosis not present

## 2021-01-02 DIAGNOSIS — L03116 Cellulitis of left lower limb: Secondary | ICD-10-CM | POA: Diagnosis present

## 2021-01-02 DIAGNOSIS — L89610 Pressure ulcer of right heel, unstageable: Secondary | ICD-10-CM | POA: Diagnosis present

## 2021-01-02 DIAGNOSIS — E1169 Type 2 diabetes mellitus with other specified complication: Secondary | ICD-10-CM | POA: Diagnosis present

## 2021-01-02 DIAGNOSIS — N186 End stage renal disease: Secondary | ICD-10-CM | POA: Diagnosis not present

## 2021-01-02 DIAGNOSIS — T148XXA Other injury of unspecified body region, initial encounter: Secondary | ICD-10-CM | POA: Diagnosis not present

## 2021-01-02 DIAGNOSIS — Z8616 Personal history of COVID-19: Secondary | ICD-10-CM | POA: Diagnosis not present

## 2021-01-02 DIAGNOSIS — R112 Nausea with vomiting, unspecified: Secondary | ICD-10-CM

## 2021-01-02 DIAGNOSIS — W458XXA Other foreign body or object entering through skin, initial encounter: Secondary | ICD-10-CM | POA: Diagnosis present

## 2021-01-02 DIAGNOSIS — Z992 Dependence on renal dialysis: Secondary | ICD-10-CM

## 2021-01-02 DIAGNOSIS — L539 Erythematous condition, unspecified: Secondary | ICD-10-CM

## 2021-01-02 DIAGNOSIS — E11649 Type 2 diabetes mellitus with hypoglycemia without coma: Secondary | ICD-10-CM | POA: Diagnosis present

## 2021-01-02 DIAGNOSIS — I480 Paroxysmal atrial fibrillation: Secondary | ICD-10-CM | POA: Diagnosis not present

## 2021-01-02 DIAGNOSIS — E1122 Type 2 diabetes mellitus with diabetic chronic kidney disease: Secondary | ICD-10-CM | POA: Diagnosis present

## 2021-01-02 DIAGNOSIS — E1129 Type 2 diabetes mellitus with other diabetic kidney complication: Secondary | ICD-10-CM

## 2021-01-02 DIAGNOSIS — L89153 Pressure ulcer of sacral region, stage 3: Secondary | ICD-10-CM | POA: Diagnosis present

## 2021-01-02 DIAGNOSIS — I70262 Atherosclerosis of native arteries of extremities with gangrene, left leg: Secondary | ICD-10-CM

## 2021-01-02 DIAGNOSIS — L89310 Pressure ulcer of right buttock, unstageable: Secondary | ICD-10-CM | POA: Diagnosis present

## 2021-01-02 DIAGNOSIS — W19XXXA Unspecified fall, initial encounter: Secondary | ICD-10-CM | POA: Diagnosis present

## 2021-01-02 DIAGNOSIS — F17210 Nicotine dependence, cigarettes, uncomplicated: Secondary | ICD-10-CM

## 2021-01-02 DIAGNOSIS — I70402 Unspecified atherosclerosis of autologous vein bypass graft(s) of the extremities, left leg: Secondary | ICD-10-CM

## 2021-01-02 DIAGNOSIS — I2510 Atherosclerotic heart disease of native coronary artery without angina pectoris: Secondary | ICD-10-CM | POA: Diagnosis present

## 2021-01-02 DIAGNOSIS — D151 Benign neoplasm of heart: Secondary | ICD-10-CM | POA: Diagnosis not present

## 2021-01-02 DIAGNOSIS — I120 Hypertensive chronic kidney disease with stage 5 chronic kidney disease or end stage renal disease: Secondary | ICD-10-CM | POA: Diagnosis present

## 2021-01-02 DIAGNOSIS — I96 Gangrene, not elsewhere classified: Secondary | ICD-10-CM | POA: Diagnosis present

## 2021-01-02 DIAGNOSIS — E1152 Type 2 diabetes mellitus with diabetic peripheral angiopathy with gangrene: Secondary | ICD-10-CM | POA: Diagnosis present

## 2021-01-02 DIAGNOSIS — N2581 Secondary hyperparathyroidism of renal origin: Secondary | ICD-10-CM | POA: Diagnosis present

## 2021-01-02 DIAGNOSIS — E1165 Type 2 diabetes mellitus with hyperglycemia: Secondary | ICD-10-CM | POA: Diagnosis present

## 2021-01-02 DIAGNOSIS — I714 Abdominal aortic aneurysm, without rupture: Secondary | ICD-10-CM | POA: Diagnosis present

## 2021-01-02 DIAGNOSIS — L89220 Pressure ulcer of left hip, unstageable: Secondary | ICD-10-CM | POA: Diagnosis present

## 2021-01-02 DIAGNOSIS — L89893 Pressure ulcer of other site, stage 3: Secondary | ICD-10-CM | POA: Diagnosis present

## 2021-01-02 DIAGNOSIS — M868X7 Other osteomyelitis, ankle and foot: Secondary | ICD-10-CM | POA: Diagnosis present

## 2021-01-02 DIAGNOSIS — T8189XA Other complications of procedures, not elsewhere classified, initial encounter: Secondary | ICD-10-CM

## 2021-01-02 DIAGNOSIS — D638 Anemia in other chronic diseases classified elsewhere: Secondary | ICD-10-CM | POA: Diagnosis not present

## 2021-01-02 LAB — COMPREHENSIVE METABOLIC PANEL
ALT: 9 U/L (ref 0–44)
AST: 11 U/L — ABNORMAL LOW (ref 15–41)
Albumin: 1.9 g/dL — ABNORMAL LOW (ref 3.5–5.0)
Alkaline Phosphatase: 84 U/L (ref 38–126)
Anion gap: 21 — ABNORMAL HIGH (ref 5–15)
BUN: 80 mg/dL — ABNORMAL HIGH (ref 8–23)
CO2: 19 mmol/L — ABNORMAL LOW (ref 22–32)
Calcium: 8.2 mg/dL — ABNORMAL LOW (ref 8.9–10.3)
Chloride: 88 mmol/L — ABNORMAL LOW (ref 98–111)
Creatinine, Ser: 8.41 mg/dL — ABNORMAL HIGH (ref 0.44–1.00)
GFR, Estimated: 5 mL/min — ABNORMAL LOW (ref >60)
Glucose, Bld: 106 mg/dL — ABNORMAL HIGH (ref 70–99)
Potassium: 4.7 mmol/L (ref 3.5–5.1)
Sodium: 128 mmol/L — ABNORMAL LOW (ref 135–145)
Total Bilirubin: 0.9 mg/dL (ref 0.3–1.2)
Total Protein: 4.5 g/dL — ABNORMAL LOW (ref 6.5–8.1)

## 2021-01-02 LAB — CBG MONITORING, ED
Glucose-Capillary: 46 mg/dL — ABNORMAL LOW (ref 70–99)
Glucose-Capillary: 94 mg/dL (ref 70–99)
Glucose-Capillary: 118 mg/dL — ABNORMAL HIGH (ref 70–99)
Glucose-Capillary: 78 mg/dL (ref 70–99)
Glucose-Capillary: 86 mg/dL (ref 70–99)
Glucose-Capillary: 82 mg/dL (ref 70–99)
Glucose-Capillary: 80 mg/dL (ref 70–99)
Glucose-Capillary: 122 mg/dL — ABNORMAL HIGH (ref 70–99)
Glucose-Capillary: 75 mg/dL (ref 70–99)
Glucose-Capillary: 54 mg/dL — ABNORMAL LOW (ref 70–99)
Glucose-Capillary: 105 mg/dL — ABNORMAL HIGH (ref 70–99)
Glucose-Capillary: 109 mg/dL — ABNORMAL HIGH (ref 70–99)

## 2021-01-02 LAB — APTT
aPTT: 34 seconds (ref 24–36)
aPTT: 33 seconds (ref 24–36)

## 2021-01-02 LAB — HEPARIN LEVEL (UNFRACTIONATED)
Heparin Unfractionated: >1.1 IU/mL — ABNORMAL HIGH (ref 0.30–0.70)
Heparin Unfractionated: >1.1 IU/mL — ABNORMAL HIGH (ref 0.30–0.70)

## 2021-01-02 LAB — CBC
HCT: 26.1 % — ABNORMAL LOW (ref 36.0–46.0)
Hemoglobin: 8.3 g/dL — ABNORMAL LOW (ref 12.0–15.0)
MCH: 31.7 pg (ref 26.0–34.0)
MCHC: 31.8 g/dL (ref 30.0–36.0)
MCV: 99.6 fL (ref 80.0–100.0)
Platelets: 201 10*3/uL (ref 150–400)
RBC: 2.62 MIL/uL — ABNORMAL LOW (ref 3.87–5.11)
RDW: 17.6 % — ABNORMAL HIGH (ref 11.5–15.5)
WBC: 13.1 10*3/uL — ABNORMAL HIGH (ref 4.0–10.5)
nRBC: 0 % (ref 0.0–0.2)

## 2021-01-02 MED ORDER — DEXTROSE 50 % IV SOLN: 50.0000 mL | Freq: Once | INTRAVENOUS | Status: AC

## 2021-01-02 MED ORDER — HYDROCODONE-ACETAMINOPHEN 5-325 MG PO TABS
1.0000 | ORAL_TABLET | Freq: Four times a day (QID) | ORAL | Status: DC | PRN
  Administered 2021-01-02 – 2021-01-09 (×11): 1 via ORAL
  Filled 2021-01-02 (×11): qty 1

## 2021-01-02 MED ORDER — ONDANSETRON HCL 4 MG PO TABS
4.0000 mg | ORAL_TABLET | Freq: Four times a day (QID) | ORAL | Status: DC | PRN
  Administered 2021-01-06 – 2021-01-07 (×3): 4 mg via ORAL
  Filled 2021-01-02 (×3): qty 1

## 2021-01-02 MED ORDER — ASPIRIN EC 81 MG PO TBEC
81.0000 mg | DELAYED_RELEASE_TABLET | Freq: Every evening | ORAL | Status: DC
  Administered 2021-01-03 – 2021-01-09 (×7): 81 mg via ORAL
  Filled 2021-01-02 (×7): qty 1

## 2021-01-02 MED ORDER — AMIODARONE HCL 200 MG PO TABS
200.0000 mg | ORAL_TABLET | Freq: Every evening | ORAL | Status: DC
  Administered 2021-01-03 – 2021-01-09 (×7): 200 mg via ORAL
  Filled 2021-01-02 (×7): qty 1

## 2021-01-02 MED ORDER — CHLORHEXIDINE GLUCONATE CLOTH 2 % EX PADS
6.0000 | MEDICATED_PAD | Freq: Every day | CUTANEOUS | Status: DC
  Administered 2021-01-04 – 2021-01-08 (×5): 6 via TOPICAL

## 2021-01-02 MED ORDER — SEVELAMER CARBONATE 800 MG PO TABS
800.0000 mg | ORAL_TABLET | Freq: Three times a day (TID) | ORAL | Status: DC
  Administered 2021-01-02 – 2021-01-08 (×14): 800 mg via ORAL
  Filled 2021-01-02 (×14): qty 1

## 2021-01-02 MED ORDER — DEXTROSE 50 % IV SOLN
INTRAVENOUS | Status: AC
  Administered 2021-01-02: 50 mL via INTRAVENOUS
  Filled 2021-01-02: qty 50

## 2021-01-02 MED ORDER — HEPARIN (PORCINE) 25000 UT/250ML-% IV SOLN
1300.0000 [IU]/h | INTRAVENOUS | Status: DC
  Administered 2021-01-02: 850 [IU]/h via INTRAVENOUS
  Filled 2021-01-02 (×2): qty 250

## 2021-01-02 MED ORDER — ONDANSETRON HCL 4 MG/2ML IJ SOLN
4.0000 mg | Freq: Four times a day (QID) | INTRAMUSCULAR | Status: DC | PRN
  Administered 2021-01-02 – 2021-01-07 (×7): 4 mg via INTRAVENOUS
  Filled 2021-01-02 (×8): qty 2

## 2021-01-02 MED ORDER — ATORVASTATIN CALCIUM 10 MG PO TABS
20.0000 mg | ORAL_TABLET | Freq: Every evening | ORAL | Status: DC
  Administered 2021-01-03 – 2021-01-09 (×7): 20 mg via ORAL
  Filled 2021-01-02 (×7): qty 2

## 2021-01-02 MED ORDER — INSULIN ASPART 100 UNIT/ML IJ SOLN: 0.0000 [IU] | INTRAMUSCULAR | Status: DC

## 2021-01-02 NOTE — ED Notes (Signed)
Pt CBG 54, given orange juice and meal tray at this time.

## 2021-01-02 NOTE — ED Notes (Signed)
IV team at bedside 

## 2021-01-02 NOTE — Progress Notes (Signed)
Pt hemodialysis tx move to tomorrow per Dr. Schertz ordered. 

## 2021-01-02 NOTE — Progress Notes (Signed)
PROGRESS NOTE  Kathryn Hancock JXB:[id number] DOB: [birth year] DOA: 01/01/2021 PCP: Gala Lewandowsky, MD  HPI/Recap of past 24 hours: Kathryn Hancock is a 70 y.o. 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 Doren Custard vascular surgeon on December 28, 2020.  Patient has been feeling persistently nauseous unable to take in and was referred to the ER.  In addition patient's sister felt that the patient's left foot has become more swollen and has noticed some discharge.  Patient also developed right-sided periorbital hematoma over the last 1 week.  This was related to the coughing/retching. In the ER x-rays reveal foreign body of the left foot, and labs showed leukocytosis with WBC count of 19,000.  Patient also was hypoglycemic in the ER.  CT head followed by CT maxillofacial and CT abdomen pelvis was done which shows preseptal hematoma of the right orbit and nothing acute in the abdomen.  Patient had been empirically started on antibiotics for the left foot possible infection.  Admitted for further management.  COVID test was negative.    Today, patient still mild intermittent nausea, but denies any further vomiting, denies any abdominal pain, chest pain, shortness of breath, fever/chills.  Noted to be significantly bruised in bilateral upper extremities with some oozing from superficial skin tears noted on upper extremities.    Assessment/Plan: Principal Problem:   Intractable nausea and vomiting Active Problems:   Left atrial myxoma   AAA (abdominal aortic aneurysm) without rupture (HCC)   ESRD (end stage renal disease) on dialysis (HCC)   PAF (paroxysmal atrial fibrillation) (HCC) -  CHA2DS2-VASc Score 5 (Age, Female, HTN, DM, Vascular)-  On Eliquis   Anemia of chronic disease   DM (diabetes mellitus), type 2 with renal complications (HCC)   S/P CABG (coronary artery bypass graft)    Intractable nausea Etiology unclear, ?Unlikely uremia, possibly infection Currently afebrile, with leukocytosis CT abdomen pelvis unremarkable Clear liquid diet, advance as tolerated Supportive management for now  ?Possible cellulitis/early osteomyelitis of left foot Known history of L gangrenous toes Recent R foot transmetatarsal amputation Right thigh wound Sacral decubitus ulcer Currently afebrile, with leukocytosis BC x2 pending LA 1.8 CT left foot with possible early osteomyelitis, no organized abscess noted Vascular surgery consulted Continue IV vancomycin, cefepime Monitor closely  ESRD on HD M/W/F Nephrology consulted Daily renal panel  Preseptal hematoma of the right eye Likely 2/2 recurrent retching, reporting blurry vision Currently on AC, will need it as she has history of embolic episodes Monitor closely  Paroxysmal A. Fib Heart rate controlled Continue amiodarone, switch from Eliquis to heparin pending nausea/procedures Telemetry  Diabetes mellitus type 2 Last A1c 5 SSI, Accu-Cheks, hypoglycemic protocol  Anemia of chronic kidney disease Hemoglobin baseline 9-10 Anemia panel pending Daily CBC        Estimated body mass index is 24.55 kg/m as calculated from the following:   Height as of 12/28/20: 5\' 3"  (1.6 m).   Weight as of 12/28/20: 62.9 kg.     Code Status: Full  Family Communication: None at bedside  Disposition Plan: Status is: Observation  The patient will require care spanning > 2 midnights and should be moved to inpatient because: Inpatient level of care appropriate due to severity of illness  Dispo: The  patient is from: SNF              Anticipated d/c is to: SNF              Patient currently is not medically stable to d/c.   Difficult to place patient  No     Consultants: Vascular surgery Nephrology  Procedures: None  Antimicrobials: Vancomycin Cefepime  DVT prophylaxis: Heparin [place]   Objective: Vitals:   01/02/21 0900 01/02/21 1000 01/02/21 1100 01/02/21 1200  BP: (!) 142/55 (!) 144/61 (!) 149/62 (!) 177/64  Pulse: 65 69 68 66  Resp: 16 13 19 17   Temp:      TempSrc:      SpO2: 99% 100% 100% 98%    Intake/Output Summary (Last 24 hours) at 01/02/2021 1355 Last data filed at 01/01/2021 2347 Gross per 24 hour  Intake 900 ml  Output --  Net 900 ml   There were no vitals filed for this visit.  Exam:  General: NAD, R eye hematoma, blurry vision noted Cardiovascular: S1, S2 present Respiratory: CTAB Abdomen: Soft, nontender, nondistended, bowel sounds present Musculoskeletal: R foot with swelling, noted staples. L foot with gangrenous toes with surrounding erythema/swelling Skin: As above, sacral decubitus ulcer, R thigh wound. Noted significant bruising with oozing on BUE Psychiatry: Normal mood    Data Reviewed: CBC: Recent Labs  Lab 01/01/21 1434 01/02/21 0500  WBC 19.4* 13.1*  NEUTROABS 17.3*  --   HGB 10.6* 8.3*  HCT 33.1* 26.1*  MCV 99.1 99.6  PLT 292 710   Basic Metabolic Panel: Recent Labs  Lab 01/01/21 1434 01/02/21 0500  NA 130* 128*  K 4.7 4.7  CL 87* 88*  CO2 23 19*  GLUCOSE 110* 106*  BUN 72* 80*  CREATININE 8.19* 8.41*  CALCIUM 9.1 8.2*   GFR: Estimated Creatinine Clearance: 5.8 mL/min (A) (by C-G formula based on SCr of 8.41 mg/dL (H)). Liver Function Tests: Recent Labs  Lab 01/01/21 1434 01/02/21 0500  AST 13* 11*  ALT 10 9  ALKPHOS 117 84  BILITOT 0.8 0.9  PROT 6.1* 4.5*  ALBUMIN 2.5* 1.9*   Recent Labs  Lab 01/01/21 1434  LIPASE 29   No results for input(s): AMMONIA in the last 168 hours. Coagulation Profile: Recent Labs  Lab 01/01/21 1842  INR 1.8*   Cardiac Enzymes: No results for input(s): CKTOTAL, CKMB, CKMBINDEX, TROPONINI in the last 168  hours. BNP (last 3 results) No results for input(s): PROBNP in the last 8760 hours. HbA1C: No results for input(s): HGBA1C in the last 72 hours. CBG: Recent Labs  Lab 01/02/21 0829 01/02/21 0906 01/02/21 0957 01/02/21 1054 01/02/21 1254  GLUCAP 82 80 122* 75 54*   Lipid Profile: No results for input(s): CHOL, HDL, LDLCALC, TRIG, CHOLHDL, LDLDIRECT in the last 72 hours. Thyroid Function Tests: No results for input(s): TSH, T4TOTAL, FREET4, T3FREE, THYROIDAB in the last 72 hours. Anemia Panel: No results for input(s): VITAMINB12, FOLATE, FERRITIN, TIBC, IRON, RETICCTPCT in the last 72 hours. Urine analysis:    Component Value Date/Time   COLORURINE AMBER (A) 09/30/2020 1035   APPEARANCEUR CLOUDY (A) 09/30/2020 1035   LABSPEC 1.012 09/30/2020 1035   PHURINE 8.0 09/30/2020 1035   GLUCOSEU NEGATIVE 09/30/2020 1035   HGBUR NEGATIVE 09/30/2020 1035   BILIRUBINUR NEGATIVE 09/30/2020 1035   KETONESUR NEGATIVE 09/30/2020 1035   PROTEINUR 30 (A) 09/30/2020 1035   NITRITE NEGATIVE 09/30/2020 1035   LEUKOCYTESUR LARGE (A) 09/30/2020 1035   Sepsis Labs: @LABRCNTIP (procalcitonin:4,lacticidven:4)  )  Recent Results (from the past 240 hour(s))  Resp Panel by RT-PCR (Flu A&B, Covid) Nasopharyngeal Swab     Status: None   Collection Time: 01/01/21  7:28 PM   Specimen: Nasopharyngeal Swab; Nasopharyngeal(NP) swabs in vial transport medium  Result Value Ref Range Status   SARS Coronavirus 2 by RT PCR NEGATIVE NEGATIVE Final    Comment: (NOTE) SARS-CoV-2 target nucleic acids are NOT DETECTED.  The SARS-CoV-2 RNA is generally detectable in upper respiratory specimens during the acute phase of infection. The lowest concentration of SARS-CoV-2 viral copies this assay can detect is 138 copies/mL. A negative result does not preclude SARS-Cov-2 infection and should not be used as the sole basis for treatment or other patient management decisions. A negative result may occur with  improper  specimen collection/handling, submission of specimen other than nasopharyngeal swab, presence of viral mutation(s) within the areas targeted by this assay, and inadequate number of viral copies(<138 copies/mL). A negative result must be combined with clinical observations, patient history, and epidemiological information. The expected result is Negative.  Fact Sheet for Patients:  EntrepreneurPulse.com.au  Fact Sheet for Healthcare Providers:  IncredibleEmployment.be  This test is no t yet approved or cleared by the [place] FDA and  has been authorized for detection and/or diagnosis of SARS-CoV-2 by FDA under an Emergency Use Authorization (EUA). This EUA will remain  in effect (meaning this test can be used) for the duration of the COVID-19 declaration under Section 564(b)(1) of the Act, 21 U.S.C.section 360bbb-3(b)(1), unless the authorization is terminated  or revoked sooner.       Influenza A by PCR NEGATIVE NEGATIVE Final   Influenza B by PCR NEGATIVE NEGATIVE Final    Comment: (NOTE) The Xpert Xpress SARS-CoV-2/FLU/RSV plus assay is intended as an aid in the diagnosis of influenza from Nasopharyngeal swab specimens and should not be used as a sole basis for treatment. Nasal washings and aspirates are unacceptable for Xpert Xpress SARS-CoV-2/FLU/RSV testing.  Fact Sheet for Patients: EntrepreneurPulse.com.au  Fact Sheet for Healthcare Providers: IncredibleEmployment.be  This test is not yet approved or cleared by the [place] FDA and has been authorized for detection and/or diagnosis of SARS-CoV-2 by FDA under an Emergency Use Authorization (EUA). This EUA will remain in effect (meaning this test can be used) for the duration of the COVID-19 declaration under Section 564(b)(1) of the Act, 21 U.S.C. section 360bbb-3(b)(1), unless the authorization is terminated or revoked.  Performed at  [facility], [place] [address] [place], [place] [address]   Blood culture (routine x 2)     Status: None (Preliminary result)   Collection Time: 01/01/21  8:08 PM   Specimen: BLOOD  Result Value Ref Range Status   Specimen Description BLOOD RIGHT ANTECUBITAL  Final   Special Requests   Final    BOTTLES DRAWN AEROBIC AND ANAEROBIC Blood Culture adequate volume   Culture   Final    NO GROWTH < 12 HOURS Performed at [facility], [place] [address] [place], [place] [address]    Report Status PENDING  Incomplete      Studies: DG Chest 2 View  Result Date: 01/01/2021 CLINICAL DATA:  weakness EXAM: CHEST - 2 VIEW COMPARISON:  November 25, 2020 FINDINGS: The cardiomediastinal silhouette is unchanged in contour.Status post median sternotomy and CABG. Atherosclerotic calcifications of the tortuous thoracic aorta. RIGHT chest CVC tip terminates over the superior cavoatrial junction. No pleural effusion. No pneumothorax. No acute pleuroparenchymal abnormality. Visualized abdomen is unremarkable.  Mild degenerative changes of the thoracic spine. IMPRESSION: No acute cardiopulmonary abnormality. Electronically Signed   By: Valentino Saxon MD   On: 01/01/2021 15:01   CT Head Wo Contrast  Result Date: 01/01/2021 CLINICAL DATA:  Weakness, vomiting, sacral pain and right Peri orbital ecchymosis. EXAM: CT HEAD WITHOUT CONTRAST TECHNIQUE: Contiguous axial images were obtained from the base of the skull through the vertex without intravenous contrast. COMPARISON:  None. FINDINGS: Brain: There is mild cerebral atrophy with widening of the extra-axial spaces and ventricular dilatation. There are areas of decreased attenuation within the white matter tracts of the supratentorial brain, consistent with microvascular disease changes. Small, chronic bilateral basal ganglia lacunar infarcts are seen. Vascular: No hyperdense vessel or unexpected calcification. Skull: Normal. Negative for acute fracture.  Sinuses/Orbits: No acute finding. Other: None. IMPRESSION: 1. Generalized cerebral atrophy. 2. No acute intracranial abnormality. Electronically Signed   By: Virgina Norfolk M.D.   On: 01/01/2021 22:13   CT FOOT LEFT WO CONTRAST  Result Date: 01/02/2021 CLINICAL DATA:  Concern for osteomyelitis EXAM: CT OF THE LEFT FOOT WITHOUT CONTRAST TECHNIQUE: Multidetector CT imaging of the left foot was performed according to the standard protocol. Multiplanar CT image reconstructions were also generated. COMPARISON:  Radiograph 01/01/2021 FINDINGS: Bones/Joint/Cartilage Diffuse edematous changes of the lower extremity with more focal ulceration extending to the cortical surface of the first distal phalanx about the level of the proximal nail plate. Some conspicuous subcortical osteopenia in this vicinity without clear osseous erosion or destruction. More focal Transcortical lucency and subcortical osteopenia involving the medial head of the first metatarsal with some associated joint fluid suspected on soft tissue windowing. Could reflect early osteomyelitis or septic arthritis. Additional superficial ulceration of the calcaneus posteriorly. No subjacent erosion or destruction. No other convincing CT evidence sites concerning for osteomyelitis. Background of diffuse mild degenerative arthrosis throughout the 4, mid and hindfoot. Corticated os peroneum is noted. Ligaments Suboptimally assessed by CT. Muscles and Tendons No clear intramuscular abscess or collection. No musculotendinous discontinuity is seen. Some hazy stranding seen along the flexor and extensor tendon sheaths could reflect some mild synovitis in the appropriate clinical setting. Soft tissues Focus of soft tissue gas seen involving the nail plate of the first digit, as described above. No other soft tissue gas. No organized collection or abscess. Additional sites of ulceration are poorly visualized Tiny linear metallic radiodensity seen along the plantar  aspect of the foot towards the level of the third proximal phalanx. Could reflect a small foreign body or prior surgical material. IMPRESSION: 1. Focus of soft tissue gas involving the nail plate of the first digit with some slightly conspicuous subcortical osteopenia at the first distal phalanx. Could reflect some reactive marrow changes or possible early osteomyelitis in the appropriate clinical setting. 2. Additional conspicuous transcortical lucency and subcortical osteopenia involving the medial aspect of the head of the first metatarsal and questionable joint fluid at this level as well which could reflect a septic arthritis with osteomyelitis. 3. Additional focal ulceration superficial to the posterior calcaneus without subjacent osseous erosion or destructive change to suggest active osteomyelitis at this time. 4. Tiny metallic foreign body versus surgical clips seen along the plantar soft tissues near the base of the third proximal phalanx. No organized abscess, collection or other soft tissue gas. 5. Exam is complicated by the presence of a heterogeneously demineralized appearance of the bones suggesting some underlying osteopenia. Electronically Signed   By: Lovena Le M.D.   On: 01/02/2021 03:14  DG Foot Complete Left  Result Date: 01/01/2021 CLINICAL DATA:  weakness EXAM: LEFT FOOT - COMPLETE 3+ VIEW COMPARISON:  None. FINDINGS: No acute fracture or dislocation. Minimal degenerative changes of the first MTP. No area of erosion or osseous destruction. There is a 3 mm linear radiopaque density in the plantar soft tissues between the third and fourth proximal phalanges. Soft tissue edema. IMPRESSION: 1. There is a 3 mm linear radiopaque density projecting over the soft tissues of the plantar aspect between the third and fourth phalanges. This may reflect a retained foreign body. 2. No radiographic evidence of osteomyelitis. Electronically Signed   By: Valentino Saxon MD   On: 01/01/2021 15:00    CT CHEST ABDOMEN PELVIS WO CONTRAST  Result Date: 01/01/2021 CLINICAL DATA:  Weakness. Emesis. Complaining of sacral pain. Flank pain. Reported bruising to the right chest wall and abdomen. Sacral wounds. EXAM: CT CHEST, ABDOMEN AND PELVIS WITHOUT CONTRAST TECHNIQUE: Multidetector CT imaging of the chest, abdomen and pelvis was performed following the standard protocol without IV contrast. COMPARISON:  07/01/2020 FINDINGS: CT CHEST FINDINGS Cardiovascular: Prior CABG surgery. Heart normal in size. Three-vessel coronary artery calcifications. No pericardial effusion. Aortic atherosclerosis. No aneurysm. Right sided dual lumen central catheter, tip in the lower superior vena cava. Mediastinum/Nodes: No enlarged mediastinal, hilar, or axillary lymph nodes. Thyroid gland, trachea, and esophagus demonstrate no significant findings. Lungs/Pleura: Minimal subsegmental atelectasis in the dependent lower lobes. Lungs otherwise clear. No pleural effusion or pneumothorax. Musculoskeletal: No fracture or acute finding.  No bone lesion. CT ABDOMEN PELVIS FINDINGS Hepatobiliary: Normal liver. Dependent gallstones. No gallbladder wall thickening or evidence of acute cholecystitis. No bile duct dilation. Pancreas: Unremarkable. No pancreatic ductal dilatation or surrounding inflammatory changes. Spleen: Normal in size without focal abnormality. Adrenals/Urinary Tract: Bilateral adrenal gland thickening consistent with hyperplasia, stable. Kidneys normal in position. No renal masses. No collecting system stones and no hydronephrosis. Normal ureters. Normal bladder. Stomach/Bowel: Stomach is within normal limits. Appendix appears normal. No evidence of bowel wall thickening, distention, or inflammatory changes. Vascular/Lymphatic: Irregular infrarenal abdominal aortic aneurysm, maximum 5.7 x 5.3 cm transversely, without significant change from the prior study. There are multiple aortic outpouchings that are also stable. No  enlarged lymph nodes. Reproductive: Uterus and bilateral adnexa are unremarkable. Other: No abdominal wall hernia or abnormality. No abdominopelvic ascites. Musculoskeletal: Limited soft tissue covering lower sacrum and coccyx consistent with an overlying sacral decubitus ulcer. No evidence of an abscess. No bone resorption to suggest osteomyelitis. No fractures or bone lesions. There is soft tissue inflammation lateral to the left proximal femur and hip. IMPRESSION: 1. No acute findings in the chest. 2. Limited soft tissue overlying the lower sacrum and coccyx consistent with a sacral decubitus ulcer, but without evidence of an abscess or underlying osteomyelitis. 3. Soft tissue inflammation lateral to the left hip, nonspecific. No defined ulcer or evidence of an abscess. 4. No acute findings within the abdomen or pelvis. 5. Abdominal aortic aneurysm with multiple accessory outpatient chains along the aorta. Current aorta measures a maximum of 5.6 x 5.3 cm transversely, 5.3 cm anterior-posterior, previously 4.9 cm measured in the same location. Recommend follow-up CT/MR every 6 months and vascular consultation. This recommendation follows ACR consensus guidelines: White Paper of the ACR Incidental Findings Committee II on Vascular Findings. J Am Coll Radiol 2013; 10:789-794. Electronically Signed   By: Lajean Manes M.D.   On: 01/01/2021 22:27   CT Maxillofacial Wo Contrast  Result Date: 01/01/2021 CLINICAL DATA:  Facial trauma.  EXAM: CT MAXILLOFACIAL WITHOUT CONTRAST TECHNIQUE: Multidetector CT imaging of the maxillofacial structures was performed. Multiplanar CT image reconstructions were also generated. COMPARISON:  None. FINDINGS: Osseous: No fracture or mandibular dislocation. No destructive process. Orbits: Negative. No traumatic or inflammatory intraorbital finding. Sinuses: Clear. Soft tissues: Mild right periorbital soft tissue swelling is noted with an associated 2.1 cm x 1.2 cm predominantly  inferior-medial right preseptal hematoma. A 5 mm x 2 mm focus of preseptal air is also seen. Limited intracranial: No significant or unexpected finding. IMPRESSION: 1. Mild RIGHT periorbital soft tissue swelling with an associated small RIGHT preseptal hematoma. Electronically Signed   By: Virgina Norfolk M.D.   On: 01/01/2021 22:22    Scheduled Meds:  amiodarone  200 mg Oral QPM   aspirin EC  81 mg Oral QPM   atorvastatin  20 mg Oral QPM   [START ON 01/03/2021] Chlorhexidine Gluconate Cloth  6 each Topical Q0600   metoCLOPramide (REGLAN) injection  5 mg Intravenous Once   sevelamer carbonate  800 mg Oral TID WC    Continuous Infusions:  ceFEPime (MAXIPIME) IV     heparin 850 Units/hr (01/02/21 1119)   vancomycin 750 mg (01/02/21 1254)     LOS: 0 days     Alma Friendly, MD [facility]  If 7PM-7AM, please contact night-coverage [email or web address] 01/02/2021, 1:55 PM

## 2021-01-02 NOTE — Progress Notes (Signed)
ANTICOAGULATION CONSULT NOTE - Initial Consult  Pharmacy Consult for heparin Indication: atrial fibrillation  Allergies  Allergen Reactions   Penicillins Anaphylaxis    Anaphylaxis as a child    Patient Measurements:   Heparin Dosing Weight: 62.9 kg  Vital Signs: BP: 141/51 (07/25 0100) Pulse Rate: 64 (07/25 0100)  Labs: Recent Labs    01/01/21 1434 01/01/21 1842  HGB 10.6*  --   HCT 33.1*  --   PLT 292  --   LABPROT  --  21.0*  INR  --  1.8*  CREATININE 8.19*  --     Estimated Creatinine Clearance: 6 mL/min (A) (by C-G formula based on SCr of 8.19 mg/dL (H)).   Medical History: Past Medical History:  Diagnosis Date   AAA (abdominal aortic aneurysm) ([place]) 06/2019   Multiple small pseudoaneurysmal projections of the dominant aorta.  Distal abdominal aortic aneurysm 4.5 x 4.7 cm.  Greatest AP dimension of the infrarenal aorta is 4.9.  No evidence of thoracic aortic aneurysm or dissection.  Brief segment of proximal IMA occlusion.   Anemia    Coronary artery disease involving native coronary artery of native heart without angina pectoris 06/01/2020   Cardiac cath at [facility] 06/01/2020-preop for atrial myxoma resection -> severe proximal RCA and PDA -> had single-vessel CABG with SVG-PDA along with myxoma resection.   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 05/27/2020   ESRD (end stage renal disease) on dialysis ([place]) 06/2020   Progression of CKDIIIb to ESRD initially related to thromboembolic event from left atrial myxoma; complicated by perioperative hypotension-; now 1 on HD TU/TH/SAT @ [facility]   GERD (gastroesophageal reflux disease)    Heavy smoker (more than 20 cigarettes per day)    ~ 2 ppd; since age 89 (18 pk yr) = has cut down to one half PPD.>   Hyperlipidemia associated with type 2 diabetes mellitus ([place]) 08/05/2020   Hypertension     LEFTATRIAL MYXOMA 06/2019   Large residual myxoma-complicated by thrombolic events with progression of renal failure and PAD. = Status post resection December 23,2021 (done at [facility] because of no bed availability at [facility]   Microscopic hematuria    PAF (paroxysmal atrial fibrillation) ([place]) 07/01/2020   Initially noted postoperatively-left atrial myxoma resection and CABG x1.  Now on amiodarone and apixaban..   Peripheral vascular disease ([place])    Bilateral SFA & ATA occlusion.  2 V runoffvia Peroneal A & PTA. --> s/p L Fem-Pop (AK pop A) bypass (PFTE) 09/12/2020 - pending R Fem-AKPop bypass   Plantar wart of right foot    PONV (postoperative nausea and vomiting)     Medications:  See medication history  Assessment: 70 yo lady on eliquis PTA to start heparin  Hg 10.6, PTLC 292.  She does have a right subconjunctival hemorrhage. Goal of Therapy:  aPTT 66-102 seconds Heparin level 0.3-0.7 units/ml Monitor platelets by anticoagulation protocol: Yes   Plan:  Start heparin with no bolus at 850 units/hr Check aPTT and heparin level in ~8 hours Monitor for bleeding complications  Cain Fitzhenry [place] 01/02/2021,2:32 AM

## 2021-01-02 NOTE — ED Notes (Signed)
MD aware that Vancomycin cannot be started as pt has limited IV access.

## 2021-01-02 NOTE — H&P (Addendum)
History and Physical    Kathryn Hancock HEN:[id number] DOB: 30-Aug-1950 DOA: 01/01/2021  PCP: Gala Lewandowsky, MD  Patient coming from: Skilled nursing facility.  History obtained from patient and patient's sister and previous chart.  ER physician.  Chief Complaint: Persistent nausea.  HPI: Kathryn Hancock is a 70 y.o. female with history of ESRD on hemodialysis Monday Wednesday Friday, CAD status post CABG, paroxysmal atrial fibrillation, embolization event secondary to myxoma in March of this year leading to gangrenous bilateral foot and with history of peripheral vascular disease status post recent transmetatarsal amputation of the right foot will discharge on December 13, 2020 to the rehab after the amputation had follow-up with Dr. Doren Custard vascular surgeon on December 28, 2020.  Patient has been feeling persistently nauseous unable to take in and was referred to the ER.  In addition patient's sister felt that the patient's left foot has become more swollen and has noticed some discharge.  Patient also developed right-sided periorbital hematoma over the last 1 week.  This was related to the coughing.  ED Course: In the ER x-rays reveal foreign body of the left foot.  and labs showed leukocytosis with WBC count of 19,000.  Patient also was hypoglycemic in the ER.  CT head followed by CT maxillofacial and CT abdomen pelvis was done which shows preseptal hematoma of the right orbit and nothing acute in the abdomen.  Patient had been empirically started on antibiotics for the left foot possible infection.  Admitted for further management.  COVID test was negative.  Review of Systems: As per HPI, rest all negative.   Past Medical History:  Diagnosis Date   AAA (abdominal aortic aneurysm) ([place]) 06/2019   Multiple small pseudoaneurysmal projections of the dominant aorta.  Distal abdominal aortic aneurysm 4.5 x 4.7 cm.  Greatest AP dimension of the infrarenal aorta is 4.9.  No evidence of thoracic aortic aneurysm or  dissection.  Brief segment of proximal IMA occlusion.   Anemia    Coronary artery disease involving native coronary artery of native heart without angina pectoris 06/01/2020   Cardiac cath at [facility] 06/01/2020-preop for atrial myxoma resection -> severe proximal RCA and PDA -> had single-vessel CABG with SVG-PDA along with myxoma resection.   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 = Status post resection December 23,2021 (done at [facility] because of no bed availability at [facility]   Microscopic hematuria    PAF (paroxysmal atrial fibrillation) ([place]) 07/01/2020   Initially noted postoperatively-left atrial myxoma resection and CABG x1.  Now on amiodarone and apixaban..   Peripheral vascular disease ([place])    Bilateral SFA & ATA occlusion.  2 V runoffvia Peroneal A & PTA. --> s/p L Fem-Pop (AK pop A) bypass (PFTE)  09/12/2020 - pending R Fem-AKPop bypass   Plantar wart of right foot    PONV (postoperative nausea and vomiting)     Past Surgical History:  Procedure Laterality Date   ABDOMINAL AORTOGRAM W/LOWER EXTREMITY N/A 07/08/2020   Procedure: ABDOMINAL AORTOGRAM W/LOWER EXTREMITY;  Surgeon: Angelia Mould, MD;  Location: [place] CV LAB:  2 R  Renal & 1 L Renal A. Known Para-Ren  Aneurysm. Bilat Com, Internal & External Iliacs - CFA& DFA patent.  Bilat SFA 100% @ origin - recon @ AK Pop A.; Bilateral 2 V runnoff - Bilateral Peroneal A & PTA patent w/ Bilat ATA CTO.   AV FISTULA PLACEMENT Left 07/11/2020   Procedure: LEFT FIRST STAGE BRACHIOBASILIC UPPER EXTREMITY ARTERIOVENOUS (AV) FISTULA CREATION;  Surgeon: [name], MD;  Location: [place];  Service: Vascular;  Laterality: Left;   BASCILIC VEIN TRANSPOSITION Left 10/03/2020   Procedure: BASILIC VEIN TRANSPOSITION SECOND STAGE LEFT;  Surgeon: Angelia Mould, MD;  Location: [place];  Service: Vascular;  Laterality: Left;   BIOPSY  10/01/2020   Procedure: BIOPSY;  Surgeon: Jackquline Denmark, MD;  Location: [facility] ENDOSCOPY;  Service: Endoscopy;;   Cardiac MRI  01/22/2020   Normal LV size and function.  Moderate focal basal septal hypertrophy.  No S.A.M.  LVEF 61%.  RVEF 66%.  Large mobile mass in the left atrium attached to the interatrial septum-does not appear to thrombus characteristics consistent with left atrial myxoma.   Chest CTA  06/2019   Multiple small pseudoaneurysmal projections of the dominant aorta.  Distal abdominal aortic aneurysm 4.5 x 4.7 cm.  Greatest AP dimension of the infrarenal aorta is 4.9.  No evidence of thoracic aortic aneurysm or dissection.  Brief segment of proximal IMA occlusion.  Large geographic filling defect of the left atrium with associated dense radiopaque material.  Aortic atherosclerosis and emphysema   COLONOSCOPY W/ POLYPECTOMY     CORONARY ARTERY BYPASS GRAFT  06/02/2020   @ [facility] - Dr. Norm Parcel; SVG-rPDA along with Atrial Myxoma Resection.   ESOPHAGOGASTRODUODENOSCOPY (EGD) WITH PROPOFOL N/A 10/01/2020   Procedure: ESOPHAGOGASTRODUODENOSCOPY (EGD) WITH PROPOFOL;  Surgeon: Jackquline Denmark, MD;  Location[facility] ENDOSCOPY;  Service: Endoscopy;  Laterality: N/A;   EYE SURGERY Bilateral    Cataract surgery   FEMORAL-POPLITEAL BYPASS GRAFT Left  09/12/2020   Procedure: LEFT FEMORAL-ABOVE KNEE POPLITEAL ARTERY BYPASS GRAFT;  Surgeon: Angelia Mould, MD;  Location: [place];  Service: Vascular;  Laterality: Left;   FEMORAL-POPLITEAL BYPASS GRAFT Right 10/31/2020   Procedure: RIGHT BYPASS GRAFT FEMORAL- ABOVE KNEE POPLITEAL ARTERY;  Surgeon: Angelia Mould, MD;  Location: [place];  Service: Vascular;  Laterality: Right;   HOT HEMOSTASIS N/A 10/01/2020   Procedure: HOT HEMOSTASIS (ARGON PLASMA COAGULATION/BICAP);  Surgeon: Jackquline Denmark, MD;  Location: [facility] ENDOSCOPY;  Service: Endoscopy;  Laterality: N/A;   I & D EXTREMITY Right 11/28/2020   Procedure: IRRIGATION AND DEBRIDEMENT RIGHT UPPER LEG WOUND;  Surgeon: Angelia Mould, MD;  Location: [facility] OR;  Service: Vascular;  Laterality: Right;   INTRAOPERATIVE TRANSESOPHAGEAL ECHOCARDIOGRAM  06/02/2020   [facility] (LA Myxoma Resection & CABG x 1): Pre-op EF> 55%. Normal WM. Mild AI. Large LA mass with calcifications &minimal vascularity (5.92 x 4 cm @ largest. LA dilation. Diffuse atherosclerosis of descending Ao, focal calcification of Sinus of  Valsalva. -> Post-op. S/p Mass Excision - no residual mass in LA. No ASD or PFO.  EF >55% no WMA.   IR FLUORO GUIDE CV LINE RIGHT  07/06/2020  IR US GUIDE VASC ACCESS RIGHT  07/06/2020   LEFT ATRIAL MYXOMA RESECTION Left 06/02/2020   [facility]: Dr. Norm Parcel   LEFT HEART CATH AND CORONARY ANGIOGRAPHY  05/2020   [facility]: Biplane Coronary Angiography, Significant Prox RCA & rPDA disease. Normal CO/CI with elevated LVEDP.   TRANSESOPHAGEAL ECHOCARDIOGRAM  05/31/2020   [facility]:  Normal LV function.  No LAA thrombus.  Large mobile left atrial mass attached to the left atrial septum (4.8 x 3.1 cm with calcification)-most consistent with atrial myxoma.  EF estimated 55%.  Normal valves.   TRANSMETATARSAL AMPUTATION Right 11/28/2020   Procedure: RIGHT TRANSMETATARSAL AMPUTATION;  Surgeon: Angelia Mould, MD;  Location: [facility] OR;  Service: Vascular;  Laterality:  Right;   TRANSTHORACIC ECHOCARDIOGRAM  07/2019    EF 60 to 65%.  GR 1 DD.  Elevated LVEDP.  Large size ill-defined echodensity in the left atrium suspicious for thrombus versus tumor.  Moderately dilated LA.   TRANSTHORACIC ECHOCARDIOGRAM  08/31/2020   (Postop left atrial myxoma resection) normal LVEF of 55-60%.  No R WMA.  GR 1 DD.  Mildly reduced RV function with normal PAP.  Trivial AI.  Left atrial myxoma no longer present.  Mitral valve stable trivial MR.   TUBAL LIGATION       reports that she has been smoking cigarettes. She has a 25.00 pack-year smoking history. She has never used smokeless tobacco. She reports previous alcohol use. She reports that she does not use drugs.  Allergies  Allergen Reactions   Penicillins Anaphylaxis    Anaphylaxis as a child    Family History  Problem Relation Age of Onset   Diabetes Mother    Diabetes Father    Heart attack Father 57   CAD Father    Hyperlipidemia Father    Hypertension Father     Prior to Admission medications   Medication Sig Start Date End Date Taking? Authorizing Provider  acetaminophen (TYLENOL) 650 MG CR tablet Take 1,300 mg by mouth every 8 (eight) hours as needed for pain.    [provider]  amiodarone (PACERONE) 200 MG tablet Take 200 mg by mouth every evening. 06/10/20 06/10/21  [provider]  apixaban (ELIQUIS) 5 MG TABS tablet Take 1 tablet (5 mg total) by mouth 2 (two) times daily. 12/10/19   Leonie Man, MD  Ascorbic Acid (VITAMIN C) 1000 MG tablet Take 1,000 mg by mouth daily.    [provider]  aspirin EC 81 MG tablet Take 81 mg by mouth every evening. Swallow whole.    [provider]  atorvastatin (LIPITOR) 20 MG tablet Take 20 mg by mouth every evening.    [provider]  bisacodyl (DULCOLAX) 5 MG EC tablet Take 1 tablet (5 mg total) by mouth daily as needed for moderate constipation. 12/10/20   Mercy Riding, MD  Darbepoetin Alfa (ARANESP) 200 MCG/0.4ML  SOSY injection Inject 0.4 mLs (200 mcg total) into the vein every Monday with hemodialysis. 12/12/20   Mercy Riding, MD  docusate sodium (COLACE) 100 MG capsule Take 100 mg by mouth daily as needed for mild constipation.    [provider]  famotidine (PEPCID) 20 MG tablet Take 1 tablet (20 mg total) by mouth 2 (two) times daily. 12/13/20   Geradine Girt, DO  fluticasone (FLONASE) 50 MCG/ACT nasal spray Place 2 sprays into both nostrils as needed for allergies or rhinitis.    [provider]  folic acid (FOLVITE) 093 MCG tablet Take 800  mcg by mouth daily.    [provider]  glucose blood test strip 1 each by Other route as needed. Use as instructed    [provider]  hydrocortisone (ANUSOL-HC) 25 MG suppository Place 1 suppository (25 mg total) rectally 2 (two) times daily. 12/10/20   Mercy Riding, MD  HYDROmorphone (DILAUDID) 4 MG tablet Take 1 tablet (4 mg total) by mouth every 4 (four) hours as needed for moderate pain (Patient is to receive TWO 4mg  tablet PO of Dilaudid ONE hour before dressing changes.). 12/12/20   Geradine Girt, DO  insulin aspart (NOVOLOG) 100 UNIT/ML injection Inject 0-6 Units into the skin 3 (three) times daily with meals. CBG < 70: Implement Hypoglycemia Standing Orders and refer to Hypoglycemia Standing Orders sidebar report CBG 70 - 120: 0 units CBG 121 - 150: 0 units CBG 151 - 200: 1 unit CBG 201-250: 2 units CBG 251-300: 3 units CBG 301-350: 4 units CBG 351-400: 5 units CBG > 400: Give 6 units and call MD 12/10/20   Mercy Riding, MD  lidocaine-prilocaine (EMLA) cream Apply 1 application topically daily as needed (dialysis). 08/12/20   [provider]  linagliptin (TRADJENTA) 5 MG TABS tablet Take 5 mg by mouth every evening.    [provider]  loperamide (IMODIUM A-D) 2 MG tablet Take 2 mg by mouth 4 (four) times daily as needed for diarrhea or loose stools.    [provider]  metoCLOPramide (REGLAN) 5 MG tablet  Take 1 tablet (5 mg total) by mouth every 8 (eight) hours as needed for refractory nausea / vomiting. 12/10/20 12/10/21  Mercy Riding, MD  montelukast (SINGULAIR) 10 MG tablet Take 10 mg by mouth daily as needed (allergies).    [provider]  multivitamin (RENA-VIT) TABS tablet Take 1 tablet by mouth at bedtime. 12/10/20   Mercy Riding, MD  Nutritional Supplements (,FEEDING SUPPLEMENT, PROSOURCE PLUS) liquid Take 30 mLs by mouth 3 (three) times daily between meals. 12/10/20   Mercy Riding, MD  ondansetron (ZOFRAN) 4 MG tablet Take 1 tablet (4 mg total) by mouth daily as needed for up to 365 doses for nausea or vomiting. 12/10/20   Mercy Riding, MD  sevelamer carbonate (RENVELA) 800 MG tablet Take 1 tablet (800 mg total) by mouth 3 (three) times daily with meals. 12/10/20   Mercy Riding, MD  traZODone (DESYREL) 50 MG tablet Take 1 tablet (50 mg total) by mouth at bedtime as needed for sleep. 12/10/20   Mercy Riding, MD    Physical Exam: Constitutional: Moderately built and nourished. Vitals:   01/01/21 1915 01/01/21 2138 01/02/21 0000 01/02/21 0100  BP: (!) 156/62 (!) 154/62 (!) 155/50 (!) 141/51  Pulse: 65 64 62 64  Resp: 12 15 12 13   Temp:      TempSrc:      SpO2: 97% 99% 100% 99%   Eyes: Right eye appears hemorrhagic. ENMT: No discharge from the ears eyes nose and mouth. Neck: No mass felt.  No neck rigidity. Respiratory: No rhonchi or crepitations. Cardiovascular: S1-S2 heard. Abdomen: Soft nontender bowel sound present. Musculoskeletal: Right foot has swelling and staples seen.  Left foot is having gangrenous toes with erythematous skin involving the midfoot. Skin: Right foot has staples.  Left foot has gangrenous toes with erythema involving the midfoot.  Multiple sacral decubitus ulcers. Neurologic: Alert awake oriented to name and place.  Moving all extremities. Psychiatric: Oriented to name and place.  Labs on Admission: I have personally reviewed following labs and  imaging studies  CBC: Recent Labs  Lab 01/01/21 1434  WBC 19.4*  NEUTROABS 17.3*  HGB 10.6*  HCT 33.1*  MCV 99.1  PLT 947   Basic Metabolic Panel: Recent Labs  Lab 01/01/21 1434  NA 130*  K 4.7  CL 87*  CO2 23  GLUCOSE 110*  BUN 72*  CREATININE 8.19*  CALCIUM 9.1   GFR: Estimated Creatinine Clearance: 6 mL/min (A) (by C-G formula based on SCr of 8.19 mg/dL (H)). Liver Function Tests: Recent Labs  Lab 01/01/21 1434  AST 13*  ALT 10  ALKPHOS 117  BILITOT 0.8  PROT 6.1*  ALBUMIN 2.5*   Recent Labs  Lab 01/01/21 1434  LIPASE 29   No results for input(s): AMMONIA in the last 168 hours. Coagulation Profile: Recent Labs  Lab 01/01/21 1842  INR 1.8*   Cardiac Enzymes: No results for input(s): CKTOTAL, CKMB, CKMBINDEX, TROPONINI in the last 168 hours. BNP (last 3 results) No results for input(s): PROBNP in the last 8760 hours. HbA1C: No results for input(s): HGBA1C in the last 72 hours. CBG: No results for input(s): GLUCAP in the last 168 hours. Lipid Profile: No results for input(s): CHOL, HDL, LDLCALC, TRIG, CHOLHDL, LDLDIRECT in the last 72 hours. Thyroid Function Tests: No results for input(s): TSH, T4TOTAL, FREET4, T3FREE, THYROIDAB in the last 72 hours. Anemia Panel: No results for input(s): VITAMINB12, FOLATE, FERRITIN, TIBC, IRON, RETICCTPCT in the last 72 hours. Urine analysis:    Component Value Date/Time   COLORURINE AMBER (A) 09/30/2020 1035   APPEARANCEUR CLOUDY (A) 09/30/2020 1035   LABSPEC 1.012 09/30/2020 1035   PHURINE 8.0 09/30/2020 1035   GLUCOSEU NEGATIVE 09/30/2020 1035   HGBUR NEGATIVE 09/30/2020 1035   BILIRUBINUR NEGATIVE 09/30/2020 1035   KETONESUR NEGATIVE 09/30/2020 1035   PROTEINUR 30 (A) 09/30/2020 1035   NITRITE NEGATIVE 09/30/2020 1035   LEUKOCYTESUR LARGE (A) 09/30/2020 1035   Sepsis Labs: @LABRCNTIP (procalcitonin:4,lacticidven:4) ) Recent Results (from the past 240 hour(s))  Resp Panel by RT-PCR (Flu A&B,  Covid) Nasopharyngeal Swab     Status: None   Collection Time: 01/01/21  7:28 PM   Specimen: Nasopharyngeal Swab; Nasopharyngeal(NP) swabs in vial transport medium  Result Value Ref Range Status   SARS Coronavirus 2 by RT PCR NEGATIVE NEGATIVE Final    Comment: (NOTE) SARS-CoV-2 target nucleic acids are NOT DETECTED.  The SARS-CoV-2 RNA is generally detectable in upper respiratory specimens during the acute phase of infection. The lowest concentration of SARS-CoV-2 viral copies this assay can detect is 138 copies/mL. A negative result does not preclude SARS-Cov-2 infection and should not be used as the sole basis for treatment or other patient management decisions. A negative result may occur with  improper specimen collection/handling, submission of specimen other than nasopharyngeal swab, presence of viral mutation(s) within the areas targeted by this assay, and inadequate number of viral copies(<138 copies/mL). A negative result must be combined with clinical observations, patient history, and epidemiological information. The expected result is Negative.  Fact Sheet for Patients:  EntrepreneurPulse.com.au  Fact Sheet for Healthcare Providers:  IncredibleEmployment.be  This test is no t yet approved or cleared by the [place] FDA and  has been authorized for detection and/or diagnosis of SARS-CoV-2 by FDA under an Emergency Use Authorization (EUA). This EUA will remain  in effect (meaning this test can be used) for the duration of the COVID-19 declaration under Section 564(b)(1) of the Act, 21 U.S.C.section  360bbb-3(b)(1), unless the authorization is terminated  or revoked sooner.       Influenza A by PCR NEGATIVE NEGATIVE Final   Influenza B by PCR NEGATIVE NEGATIVE Final    Comment: (NOTE) The Xpert Xpress SARS-CoV-2/FLU/RSV plus assay is intended as an aid in the diagnosis of influenza from Nasopharyngeal swab specimens and should  not be used as a sole basis for treatment. Nasal washings and aspirates are unacceptable for Xpert Xpress SARS-CoV-2/FLU/RSV testing.  Fact Sheet for Patients: EntrepreneurPulse.com.au  Fact Sheet for Healthcare Providers: IncredibleEmployment.be  This test is not yet approved or cleared by the [place] FDA and has been authorized for detection and/or diagnosis of SARS-CoV-2 by FDA under an Emergency Use Authorization (EUA). This EUA will remain in effect (meaning this test can be used) for the duration of the COVID-19 declaration under Section 564(b)(1) of the Act, 21 U.S.C. section 360bbb-3(b)(1), unless the authorization is terminated or revoked.  Performed at [facility], [place] [address], [place], [place] [address]      Radiological Exams on Admission: DG Chest 2 View  Result Date: 01/01/2021 CLINICAL DATA:  weakness EXAM: CHEST - 2 VIEW COMPARISON:  November 25, 2020 FINDINGS: The cardiomediastinal silhouette is unchanged in contour.Status post median sternotomy and CABG. Atherosclerotic calcifications of the tortuous thoracic aorta. RIGHT chest CVC tip terminates over the superior cavoatrial junction. No pleural effusion. No pneumothorax. No acute pleuroparenchymal abnormality. Visualized abdomen is unremarkable. Mild degenerative changes of the thoracic spine. IMPRESSION: No acute cardiopulmonary abnormality. Electronically Signed   By: Valentino Saxon MD   On: 01/01/2021 15:01   CT Head Wo Contrast  Result Date: 01/01/2021 CLINICAL DATA:  Weakness, vomiting, sacral pain and right Peri orbital ecchymosis. EXAM: CT HEAD WITHOUT CONTRAST TECHNIQUE: Contiguous axial images were obtained from the base of the skull through the vertex without intravenous contrast. COMPARISON:  None. FINDINGS: Brain: There is mild cerebral atrophy with widening of the extra-axial spaces and ventricular dilatation. There are areas of decreased attenuation  within the white matter tracts of the supratentorial brain, consistent with microvascular disease changes. Small, chronic bilateral basal ganglia lacunar infarcts are seen. Vascular: No hyperdense vessel or unexpected calcification. Skull: Normal. Negative for acute fracture. Sinuses/Orbits: No acute finding. Other: None. IMPRESSION: 1. Generalized cerebral atrophy. 2. No acute intracranial abnormality. Electronically Signed   By: Virgina Norfolk M.D.   On: 01/01/2021 22:13   DG Foot Complete Left  Result Date: 01/01/2021 CLINICAL DATA:  weakness EXAM: LEFT FOOT - COMPLETE 3+ VIEW COMPARISON:  None. FINDINGS: No acute fracture or dislocation. Minimal degenerative changes of the first MTP. No area of erosion or osseous destruction. There is a 3 mm linear radiopaque density in the plantar soft tissues between the third and fourth proximal phalanges. Soft tissue edema. IMPRESSION: 1. There is a 3 mm linear radiopaque density projecting over the soft tissues of the plantar aspect between the third and fourth phalanges. This may reflect a retained foreign body. 2. No radiographic evidence of osteomyelitis. Electronically Signed   By: Valentino Saxon MD   On: 01/01/2021 15:00   CT CHEST ABDOMEN PELVIS WO CONTRAST  Result Date: 01/01/2021 CLINICAL DATA:  Weakness. Emesis. Complaining of sacral pain. Flank pain. Reported bruising to the right chest wall and abdomen. Sacral wounds. EXAM: CT CHEST, ABDOMEN AND PELVIS WITHOUT CONTRAST TECHNIQUE: Multidetector CT imaging of the chest, abdomen and pelvis was performed following the standard protocol without IV contrast. COMPARISON:  07/01/2020 FINDINGS: CT CHEST FINDINGS Cardiovascular:  Prior CABG surgery. Heart normal in size. Three-vessel coronary artery calcifications. No pericardial effusion. Aortic atherosclerosis. No aneurysm. Right sided dual lumen central catheter, tip in the lower superior vena cava. Mediastinum/Nodes: No enlarged mediastinal, hilar, or  axillary lymph nodes. Thyroid gland, trachea, and esophagus demonstrate no significant findings. Lungs/Pleura: Minimal subsegmental atelectasis in the dependent lower lobes. Lungs otherwise clear. No pleural effusion or pneumothorax. Musculoskeletal: No fracture or acute finding.  No bone lesion. CT ABDOMEN PELVIS FINDINGS Hepatobiliary: Normal liver. Dependent gallstones. No gallbladder wall thickening or evidence of acute cholecystitis. No bile duct dilation. Pancreas: Unremarkable. No pancreatic ductal dilatation or surrounding inflammatory changes. Spleen: Normal in size without focal abnormality. Adrenals/Urinary Tract: Bilateral adrenal gland thickening consistent with hyperplasia, stable. Kidneys normal in position. No renal masses. No collecting system stones and no hydronephrosis. Normal ureters. Normal bladder. Stomach/Bowel: Stomach is within normal limits. Appendix appears normal. No evidence of bowel wall thickening, distention, or inflammatory changes. Vascular/Lymphatic: Irregular infrarenal abdominal aortic aneurysm, maximum 5.7 x 5.3 cm transversely, without significant change from the prior study. There are multiple aortic outpouchings that are also stable. No enlarged lymph nodes. Reproductive: Uterus and bilateral adnexa are unremarkable. Other: No abdominal wall hernia or abnormality. No abdominopelvic ascites. Musculoskeletal: Limited soft tissue covering lower sacrum and coccyx consistent with an overlying sacral decubitus ulcer. No evidence of an abscess. No bone resorption to suggest osteomyelitis. No fractures or bone lesions. There is soft tissue inflammation lateral to the left proximal femur and hip. IMPRESSION: 1. No acute findings in the chest. 2. Limited soft tissue overlying the lower sacrum and coccyx consistent with a sacral decubitus ulcer, but without evidence of an abscess or underlying osteomyelitis. 3. Soft tissue inflammation lateral to the left hip, nonspecific. No defined  ulcer or evidence of an abscess. 4. No acute findings within the abdomen or pelvis. 5. Abdominal aortic aneurysm with multiple accessory outpatient chains along the aorta. Current aorta measures a maximum of 5.6 x 5.3 cm transversely, 5.3 cm anterior-posterior, previously 4.9 cm measured in the same location. Recommend follow-up CT/MR every 6 months and vascular consultation. This recommendation follows ACR consensus guidelines: White Paper of the ACR Incidental Findings Committee II on Vascular Findings. J Am Coll Radiol 2013; 10:789-794. Electronically Signed   By: Lajean Manes M.D.   On: 01/01/2021 22:27   CT Maxillofacial Wo Contrast  Result Date: 01/01/2021 CLINICAL DATA:  Facial trauma. EXAM: CT MAXILLOFACIAL WITHOUT CONTRAST TECHNIQUE: Multidetector CT imaging of the maxillofacial structures was performed. Multiplanar CT image reconstructions were also generated. COMPARISON:  None. FINDINGS: Osseous: No fracture or mandibular dislocation. No destructive process. Orbits: Negative. No traumatic or inflammatory intraorbital finding. Sinuses: Clear. Soft tissues: Mild right periorbital soft tissue swelling is noted with an associated 2.1 cm x 1.2 cm predominantly inferior-medial right preseptal hematoma. A 5 mm x 2 mm focus of preseptal air is also seen. Limited intracranial: No significant or unexpected finding. IMPRESSION: 1. Mild RIGHT periorbital soft tissue swelling with an associated small RIGHT preseptal hematoma. Electronically Signed   By: Virgina Norfolk M.D.   On: 01/01/2021 22:22    EKG: Independently reviewed.  Normal sinus rhythm.  Assessment/Plan Principal Problem:   Intractable nausea and vomiting Active Problems:   Left atrial myxoma   AAA (abdominal aortic aneurysm) without rupture (HCC)   ESRD (end stage renal disease) on dialysis (HCC)   PAF (paroxysmal atrial fibrillation) (HCC) -  CHA2DS2-VASc Score 5 (Age, Female, HTN, DM, Vascular)- On Eliquis   Anemia of  chronic  disease   DM (diabetes mellitus), type 2 with renal complications (HCC)   S/P CABG (coronary artery bypass graft)    Intractable nausea -cause not clear.  We will keep patient on clear liquid diet and closely monitor.  We will see if patient's symptoms improve with dialysis.  CT scan of the abdomen does not show anything acute. Possible cellulitis of the left foot with worsening infection with known history of gangrenous toes from previous embolization with recent right foot transmetatarsal amputation with staples on I have ordered a CT scan of the left foot to make sure there is no deep abscesses or osteomyelitis.  Patient has been already started on empiric antibiotics. History of paroxysmal atrial fibrillation since patient has persistent nausea not sure if patient will be able to take Eliquis I will keep patient on heparin.  On amiodarone for rate control. ESRD on hemodialysis on Monday Wednesday Friday.  Had last dialysis on Friday 2 days ago.  Consult nephrology for dialysis. Diabetes mellitus type 2 patient is presently hyperglycemic will check CBGs q. hourly. History of peripheral vascular disease status post recent transmetatarsal amputation and previous bypass with nonhealing right thigh wound. Sacral decubitus ulcers. Anemia of chronic disease.  Follow CBC. Preseptal hematoma of the right eye.  Could be from constant retching.  We will continue to monitor.  Note that patient is on anticoagulation.  Not holding anticoagulation secondary history of embolic episodes.     DVT prophylaxis: Heparin infusion. Code Status: Full code confirmed with patient's sister. Family Communication: Patient's sister. Disposition Plan: Back to rehab when stable. Consults called: We will need to consult vascular surgery and nephrology. Admission status: Observation.   Rise Patience MD [facility] Pager [phone].  If 7PM-7AM, please contact night-coverage [email or web address] Password  [email or web address]  01/02/2021, 2:21 AM

## 2021-01-02 NOTE — Progress Notes (Signed)
ANTICOAGULATION CONSULT NOTE - Initial Consult  Pharmacy Consult for heparin Indication: atrial fibrillation  Allergies  Allergen Reactions   Penicillins Anaphylaxis    Anaphylaxis as a child    Patient Measurements:   Heparin Dosing Weight: 62.9 kg  Vital Signs: BP: 177/64 (07/25 1200) Pulse Rate: 66 (07/25 1200)  Labs: Recent Labs    01/01/21 1434 01/01/21 1842 01/02/21 0500 01/02/21 1036  HGB 10.6*  --  8.3*  --   HCT 33.1*  --  26.1*  --   PLT 292  --  201  --   APTT  --   --   --  34  LABPROT  --  21.0*  --   --   INR  --  1.8*  --   --   HEPARINUNFRC  --   --   --  >1.10*  CREATININE 8.19*  --  8.41*  --      Estimated Creatinine Clearance: 5.8 mL/min (A) (by C-G formula based on SCr of 8.41 mg/dL (H)).   Medical History: Past Medical History:  Diagnosis Date   AAA (abdominal aortic aneurysm) 06/2019 ([place]) 06/2019   Multiple small pseudoaneurysmal projections of the dominant aorta.  Distal abdominal aortic aneurysm 4.5 x 4.7 cm.  Greatest AP dimension of the infrarenal aorta is 4.9.  No evidence of thoracic aortic aneurysm or dissection.  Brief segment of proximal IMA occlusion.   Anemia    Coronary artery disease involving native coronary artery of native heart without angina pectoris 06/01/2020   Cardiac cath at [facility] 06/01/2020-preop for atrial myxoma resection -> severe proximal RCA and PDA -> had single-vessel CABG with SVG-PDA along with myxoma resection.   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 05/27/2020   ESRD (end stage renal disease) on dialysis ([place]) 06/2020   Progression of CKDIIIb to ESRD initially related to thromboembolic event from left atrial myxoma; complicated by perioperative hypotension-; now 1 on HD TU/TH/SAT @ [place]   GERD (gastroesophageal reflux disease)    Heavy smoker (more than 20 cigarettes per day)    ~ 2  ppd; since age 29 (90 pk yr) = has cut down to one half PPD.>   Hyperlipidemia associated with type 2 diabetes mellitus ([place]) 08/05/2020   Hypertension    LEFTATRIAL MYXOMA 06/2019   Large residual myxoma-complicated by thrombolic events with progression of renal failure and PAD. = Status post resection December 23,2021 (done at [facility] because of no bed availability at [facility]   Microscopic hematuria    PAF (paroxysmal atrial fibrillation) ([place]) 07/01/2020   Initially noted postoperatively-left atrial myxoma resection and CABG x1.  Now on amiodarone and apixaban..   Peripheral vascular disease ([place])    Bilateral SFA & ATA occlusion.  2 V runoffvia Peroneal A & PTA. --> s/p L Fem-Pop (AK pop A) bypass (PFTE) 09/12/2020 - pending R Fem-AKPop bypass   Plantar wart of right foot    PONV (postoperative nausea and vomiting)     Medications:  See medication history  Assessment: 70 yof on eliquis PTA to start heparin. Patient is ESRD on dialysis.  Notably patient is found to have a right subconjunctival hemorrhage. CT Chest also notable for AAA without rupture.  Apixaban last dose prior to arrival 7/24 @ 0900. Baseline PT/INR 21 / 1.8. Heparin level 7/25 @ 1036 >1.1; aPTT drawn at same time 34 which is  subtherapeutic.  Goal of Therapy:  aPTT 66-102 seconds Heparin level 0.3-0.7 units/ml Monitor platelets by anticoagulation protocol: Yes   Plan:  Will increase heparin to 1050 units/hr Will refrain from bolus at this time Check aPTT and heparin level in ~8 hours Monitor for bleeding complications Continue to monitor both aPTT and heparin levels until corresponding  Lorelei Pont, PharmD, BCPS 01/02/2021 1:19 PM ED Clinical Pharmacist -  [phone]

## 2021-01-02 NOTE — Consult Note (Addendum)
[facility] NOTE   MRN : [id number]  Reason for Consult: PAD with dry gangrene left foot and right TMA Referring Physician: ED  History of Present Illness: 70 y/o female followed in our office by Dr. Scot Dock.  She has undergone a right femoral to above-knee popliteal artery bypass with a vein graft.  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  She had undergone a previous left femoral to above-knee popliteal artery bypass with PTFE.   On 11/28/2020 she underwent a right transmetatarsal amputation and debridement of the wound of the right thigh post vein harvest for bypass.  The right tMA has staples, the left toes have dry gangrene and the right thigh wet to dry dressing changes.   She was brought to the ED for failure to thrive.  N/V unable to eat much.  She states her feet and legs haven't really changed since she last saw Dr. Scot Dock on 12/28/20.  She does not ambulate and has multiple pressure ulcers.       Current Facility-Administered Medications  Medication Dose Route Frequency Provider Last Rate Last Admin   amiodarone (PACERONE) tablet 200 mg  200 mg Oral QPM Rise Patience, MD       aspirin EC tablet 81 mg  81 mg Oral QPM Rise Patience, MD       atorvastatin (LIPITOR) tablet 20 mg  20 mg Oral QPM Rise Patience, MD       ceFEPIme (MAXIPIME) 1 g in sodium chloride 0.9 % 100 mL IVPB  1 g Intravenous Q24H Rise Patience, MD       [START ON 01/03/2021] Chlorhexidine Gluconate Cloth 2 % PADS 6 each  6 each Topical Q0600 Lynnda Child, PA-C       heparin ADULT infusion 100 units/mL (25000 units/264mL)  850 Units/hr Intravenous Continuous Rise Patience, MD 8.5 mL/hr at 01/02/21 1119 850 Units/hr at 01/02/21 1119   metoCLOPramide (REGLAN) injection 5 mg  5 mg Intravenous Once Rise Patience, MD       ondansetron [facility]) tablet 4 mg  4 mg Oral Q6H PRN  Rise Patience, MD       Or   ondansetron [facility]) injection 4 mg  4 mg Intravenous Q6H PRN Rise Patience, MD   4 mg at 01/02/21 0735   sevelamer carbonate (RENVELA) tablet 800 mg  800 mg Oral TID WC Rise Patience, MD   800 mg at 01/02/21 0734   vancomycin (VANCOREADY) IVPB 750 mg/150 mL  750 mg Intravenous Q M,W,F-HD Rise Patience, MD       Current Outpatient Medications  Medication Sig Dispense Refill   acetaminophen (TYLENOL) 650 MG CR tablet Take 650 mg by mouth every 8 (eight) hours as needed for pain.     amiodarone (PACERONE) 200 MG tablet Take 200 mg by mouth every evening.     apixaban (ELIQUIS) 5 MG TABS tablet Take 1 tablet (5 mg total) by mouth 2 (two) times daily. 60 tablet 6   Ascorbic Acid (VITAMIN C) 1000 MG tablet Take 1,000 mg by mouth daily.     aspirin EC 81 MG tablet Take 81 mg by mouth every evening. Swallow whole.     atorvastatin (LIPITOR) 20 MG tablet Take 20 mg by mouth every evening.     bisacodyl (DULCOLAX) 5 MG EC tablet Take 1 tablet (5 mg total) by  mouth daily as needed for moderate constipation. 30 tablet 0   Dextrose, Diabetic Use, (INSTA-GLUCOSE) 77.4 % GEL Take 1 Dose by mouth daily as needed (BG<70).     docusate sodium (COLACE) 100 MG capsule Take 100 mg by mouth daily.     erythromycin ophthalmic ointment Place 1 application into the right eye 3 (three) times daily.     famotidine (PEPCID) 20 MG tablet Take 1 tablet (20 mg total) by mouth 2 (two) times daily.     fluticasone (FLONASE) 50 MCG/ACT nasal spray Place 2 sprays into both nostrils as needed for allergies or rhinitis.     folic acid (FOLVITE) 466 MCG tablet Take 800 mcg by mouth daily.     glucagon (GLUCAGEN) 1 MG SOLR injection Inject 1 mg into the vein once as needed for low blood sugar. BG<70     hydrocortisone (ANUSOL-HC) 25 MG suppository Place 1 suppository (25 mg total) rectally 2 (two) times daily. 12 suppository 0   HYDROmorphone (DILAUDID) 4 MG tablet Take 1  tablet (4 mg total) by mouth every 4 (four) hours as needed for moderate pain (Patient is to receive TWO 4mg  tablet PO of Dilaudid ONE hour before dressing changes.). 6 tablet 0   insulin lispro (HUMALOG) 100 UNIT/ML KwikPen Inject 0-6 Units into the skin in the morning, at noon, in the evening, and at bedtime. If 70-150=0 units, 151-200=1 unit, 201-250=2 units, 251-300=3 units, 301-350=4 units, 351-400=6 units over 400 Call MD     linagliptin (TRADJENTA) 5 MG TABS tablet Take 5 mg by mouth every evening.     loperamide (IMODIUM A-D) 2 MG tablet Take 2 mg by mouth 4 (four) times daily as needed for diarrhea or loose stools.     metoCLOPramide (REGLAN) 5 MG tablet Take 1 tablet (5 mg total) by mouth every 8 (eight) hours as needed for refractory nausea / vomiting. 90 tablet 1   montelukast (SINGULAIR) 10 MG tablet Take 10 mg by mouth daily as needed (allergies).     multivitamin (RENA-VIT) TABS tablet Take 1 tablet by mouth at bedtime.  0   Nutritional Supplements (,FEEDING SUPPLEMENT, PROSOURCE PLUS) liquid Take 30 mLs by mouth 3 (three) times daily between meals.     ondansetron (ZOFRAN) 4 MG tablet Take 1 tablet (4 mg total) by mouth daily as needed for up to 365 doses for nausea or vomiting. (Patient taking differently: Take 8 mg by mouth every 12 (twelve) hours as needed for nausea or vomiting.) 20 tablet 0   oxyCODONE-acetaminophen (PERCOCET/ROXICET) 5-325 MG tablet Take 1 tablet by mouth every 3 (three) hours as needed for severe pain.     sevelamer carbonate (RENVELA) 800 MG tablet Take 1 tablet (800 mg total) by mouth 3 (three) times daily with meals.     traZODone (DESYREL) 50 MG tablet Take 1 tablet (50 mg total) by mouth at bedtime as needed for sleep.     Darbepoetin Alfa (ARANESP) 200 MCG/0.4ML SOSY injection Inject 0.4 mLs (200 mcg total) into the vein every Monday with hemodialysis. 1.68 mL    glucose blood test strip 1 each by Other route as needed. Use as instructed      lidocaine-prilocaine (EMLA) cream Apply 1 application topically daily as needed (dialysis).      Pt meds include: Statin :Yes Betablocker: No ASA: Yes Other anticoagulants/antiplatelets: Eliquis  Past Medical History:  Diagnosis Date   AAA (abdominal aortic aneurysm) ([place]) 06/2019   Multiple small pseudoaneurysmal projections of the dominant aorta.  Distal abdominal aortic aneurysm 4.5 x 4.7 cm.  Greatest AP dimension of the infrarenal aorta is 4.9.  No evidence of thoracic aortic aneurysm or dissection.  Brief segment of proximal IMA occlusion.   Anemia    Coronary artery disease involving native coronary artery of native heart without angina pectoris 06/01/2020   Cardiac [facility] at [facility] 06/01/2020-preop for atrial myxoma resection -> severe proximal RCA and PDA -> had single-vessel CABG with SVG-PDA along with myxoma resection.   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 05/27/2020   ESRD (end stage renal disease) on dialysis ([place]) 06/2020   Progression of CKDIIIb to ESRD initially related to thromboembolic event from left atrial myxoma; complicated by perioperative hypotension-; now 1 on HD TU/TH/SAT @ [place]   GERD (gastroesophageal reflux disease)    Heavy smoker (more than 20 cigarettes per day)    ~ 2 ppd; since age 64 (32 pk yr) = has cut down to one half PPD.>   Hyperlipidemia associated with type 2 diabetes mellitus ([place]) 08/05/2020   Hypertension    LEFTATRIAL MYXOMA 06/2019   Large residual myxoma-complicated by thrombolic events with progression of renal failure and PAD. = Status post resection December 23,2021 (done at [facility] because of no bed availability at [facility]   Microscopic hematuria    PAF (paroxysmal atrial fibrillation) ([place]) 07/01/2020   Initially noted postoperatively-left atrial myxoma resection and CABG x1.  Now on amiodarone and  apixaban..   Peripheral vascular disease ([place])    Bilateral SFA & ATA occlusion.  2 V runoffvia Peroneal A & PTA. --> s/p L Fem-Pop (AK pop A) bypass (PFTE) 09/12/2020 - pending R Fem-AKPop bypass   Plantar wart of right foot    PONV (postoperative nausea and vomiting)     Past Surgical History:  Procedure Laterality Date   ABDOMINAL AORTOGRAM W/LOWER EXTREMITY N/A 07/08/2020   Procedure: ABDOMINAL AORTOGRAM W/LOWER EXTREMITY;  Surgeon: Angelia Mould, MD;  Location: [place] CV LAB:  2 R Renal & 1 L Renal A. Known Para-Ren  Aneurysm. Bilat Com, Internal & External Iliacs - CFA& DFA patent.  Bilat SFA 100% @ origin - recon @ AK Pop A.; Bilateral 2 V runnoff - Bilateral Peroneal A & PTA patent w/ Bilat ATA CTO.   AV FISTULA PLACEMENT Left 07/11/2020   Procedure: LEFT FIRST STAGE BRACHIOBASILIC UPPER EXTREMITY ARTERIOVENOUS (AV) FISTULA CREATION;  Surgeon: [name], MD;  Location: [place];  Service: Vascular;  Laterality: Left;   BASCILIC VEIN TRANSPOSITION Left 10/03/2020   Procedure: BASILIC VEIN TRANSPOSITION SECOND STAGE LEFT;  Surgeon: Angelia Mould, MD;  Location: [place];  Service: Vascular;  Laterality: Left;   BIOPSY  10/01/2020   Procedure: BIOPSY;  Surgeon: Jackquline Denmark, MD;  Location: [facility] ENDOSCOPY;  Service: Endoscopy;;   Cardiac MRI  01/22/2020   Normal LV size and function.  Moderate focal basal septal hypertrophy.  No S.A.M.  LVEF 61%.  RVEF 66%.  Large mobile mass in the left atrium attached to the interatrial septum-does not appear to thrombus characteristics consistent with left atrial myxoma.   Chest CTA  06/2019   Multiple small pseudoaneurysmal projections of the dominant aorta.  Distal abdominal aortic aneurysm 4.5 x 4.7 cm.  Greatest AP dimension of the infrarenal aorta is 4.9.  No evidence of thoracic aortic aneurysm or dissection.  Brief segment of proximal IMA  occlusion.  Large geographic filling defect of the left atrium with associated dense  radiopaque material.  Aortic atherosclerosis and emphysema   COLONOSCOPY W/ POLYPECTOMY     CORONARY ARTERY BYPASS GRAFT  06/02/2020   @ [facility] - Dr. Norm Parcel; SVG-rPDA along with Atrial Myxoma Resection.   ESOPHAGOGASTRODUODENOSCOPY (EGD) WITH PROPOFOL N/A 10/01/2020   Procedure: ESOPHAGOGASTRODUODENOSCOPY (EGD) WITH PROPOFOL;  Surgeon: Jackquline Denmark, MD;  Location: [facility] ENDOSCOPY;  Service: Endoscopy;  Laterality: N/A;   EYE SURGERY Bilateral    Cataract surgery   FEMORAL-POPLITEAL BYPASS GRAFT Left 09/12/2020   Procedure: LEFT FEMORAL-ABOVE KNEE POPLITEAL ARTERY BYPASS GRAFT;  Surgeon: Angelia Mould, MD;  Location: [place];  Service: Vascular;  Laterality: Left;   FEMORAL-POPLITEAL BYPASS GRAFT Right 10/31/2020   Procedure: RIGHT BYPASS GRAFT FEMORAL- ABOVE KNEE POPLITEAL ARTERY;  Surgeon: Angelia Mould, MD;  Location: [place];  Service: Vascular;  Laterality: Right;   HOT HEMOSTASIS N/A 10/01/2020   Procedure: HOT HEMOSTASIS (ARGON PLASMA COAGULATION/BICAP);  Surgeon: Jackquline Denmark, MD;  Location: [facility] ENDOSCOPY;  Service: Endoscopy;  Laterality: N/A;   I & D EXTREMITY Right 11/28/2020   Procedure: IRRIGATION AND DEBRIDEMENT RIGHT UPPER LEG WOUND;  Surgeon: Angelia Mould, MD;  Location: [facility] OR;  Service: Vascular;  Laterality: Right;   INTRAOPERATIVE TRANSESOPHAGEAL ECHOCARDIOGRAM  06/02/2020   [facility] (LA Myxoma Resection & CABG x 1): Pre-op EF> 55%. Normal WM. Mild AI. Large LA mass with calcifications &minimal vascularity (5.92 x 4 cm @ largest. LA dilation. Diffuse atherosclerosis of descending Ao, focal calcification of Sinus of  Valsalva. -> Post-op. S/p Mass Excision - no residual mass in LA. No ASD or PFO.  EF >55% no WMA.   IR FLUORO GUIDE CV LINE RIGHT  07/06/2020   IR US GUIDE VASC ACCESS RIGHT  07/06/2020   LEFT ATRIAL MYXOMA RESECTION Left 06/02/2020   [facility]: Dr. Norm Parcel   LEFT HEART CATH AND CORONARY ANGIOGRAPHY  05/2020   [facility]: Biplane Coronary Angiography,  Significant Prox RCA & rPDA disease. Normal CO/CI with elevated LVEDP.   TRANSESOPHAGEAL ECHOCARDIOGRAM  05/31/2020   [facility]:  Normal LV function.  No LAA thrombus.  Large mobile left atrial mass attached to the left atrial septum (4.8 x 3.1 cm with calcification)-most consistent with atrial myxoma.  EF estimated 55%.  Normal valves.   TRANSMETATARSAL AMPUTATION Right 11/28/2020   Procedure: RIGHT TRANSMETATARSAL AMPUTATION;  Surgeon: Angelia Mould, MD;  Location: [facility] OR;  Service: Vascular;  Laterality: Right;   TRANSTHORACIC ECHOCARDIOGRAM  07/2019    EF 60 to 65%.  GR 1 DD.  Elevated LVEDP.  Large size ill-defined echodensity in the left atrium suspicious for thrombus versus tumor.  Moderately dilated LA.   TRANSTHORACIC ECHOCARDIOGRAM  08/31/2020   (Postop left atrial myxoma resection) normal LVEF of 55-60%.  No R WMA.  GR 1 DD.  Mildly reduced RV function with normal PAP.  Trivial AI.  Left atrial myxoma no longer present.  Mitral valve stable trivial MR.   TUBAL LIGATION      Social History Social History   Tobacco Use   Smoking status: Every Day    Packs/day: 0.50    Years: 50.00    Pack years: 25.00    Types: Cigarettes   Smokeless tobacco: Never  Vaping Use   Vaping Use: Never used  Substance Use Topics   Alcohol use: Not Currently   Drug use: Never    Family History Family History  Problem Relation Age of Onset  Diabetes Mother    Diabetes Father    Heart attack Father 65   CAD Father    Hyperlipidemia Father    Hypertension Father     Allergies  Allergen Reactions   Penicillins Anaphylaxis    Anaphylaxis as a child     REVIEW OF SYSTEMS  General: [ ]  Weight loss, [ ]  Fever, [ ]  chills Neurologic: [ ]  Dizziness, [ ]  Blackouts, [ ]  Seizure [ ]  Stroke, [ ]  "Mini stroke", [ ]  Slurred speech, [ ]  Temporary blindness; [ ]  weakness in arms or legs, [ ]  Hoarseness [ ]  Dysphagia Cardiac: [ ]  Chest pain/pressure, [ ]  Shortness of breath at rest [ ]  Shortness  of breath with exertion, [ ]  Atrial fibrillation or irregular heartbeat  Vascular: [ ]  Pain in legs with walking, [ ]  Pain in legs at rest, [ ]  Pain in legs at night,  [ ]  Non-healing ulcer, [ ]  Blood clot in vein/DVT,   Pulmonary: [ ]  Home oxygen, [ ]  Productive cough, [ ]  Coughing up blood, [ ]  Asthma,  [ ]  Wheezing [ ]  COPD Musculoskeletal:  [ ]  Arthritis, [ ]  Low back pain, [ ]  Joint pain Hematologic: [ ]  Easy Bruising, [ ]  Anemia; [ ]  Hepatitis Gastrointestinal: [ ]  Blood in stool, [ ]  Gastroesophageal Reflux/heartburn, Urinary: [ ]  chronic Kidney disease, [ ]  on HD - [ ]  MWF or [ ]  TTHS, [ ]  Burning with urination, [ ]  Difficulty urinating Skin: [ ]  Rashes, [ ]  Wounds Psychological: [ ]  Anxiety, [ ]  Depression  Physical Examination Vitals:   01/02/21 0900 01/02/21 1000 01/02/21 1100 01/02/21 1200  BP: (!) 142/55 (!) 144/61 (!) 149/62 (!) 177/64  Pulse: 65 69 68 66  Resp: 16 13 19 17   Temp:      TempSrc:      SpO2: 99% 100% 100% 98%   There is no height or weight on file to calculate BMI.  General:   NAD HENT: WNL Eyes: Right eye ecchymosis, blood shot with blurred vision Pulmonary: normal non-labored breathing , without Rales, rhonchi,  wheezing Cardiac: RRR, without  Murmurs, rubs or gallops; No carotid bruits Abdomen: soft, NT, no masses   Vascular Exam/Pulses:Doppler signals PT B         Musculoskeletal: positive muscle wasting or atrophy; minimal left LE edema edema  Neurologic: A&O X 3; Appropriate Affect ;  SENSATION: normal; Speech is fluent/normal   Significant Diagnostic Studies: CBC Lab Results  Component Value Date   WBC 13.1 (H) 01/02/2021   HGB 8.3 (L) 01/02/2021   HCT 26.1 (L) 01/02/2021   MCV 99.6 01/02/2021   PLT 201 01/02/2021    BMET    Component Value Date/Time   NA 128 (L) 01/02/2021 0500   NA 139 05/26/2020 1158   K 4.7 01/02/2021 0500   CL 88 (L) 01/02/2021 0500   CO2 19 (L) 01/02/2021 0500   GLUCOSE 106 (H) 01/02/2021  0500   BUN 80 (H) 01/02/2021 0500   BUN 75 (H) 05/26/2020 1158   CREATININE 8.41 (H) 01/02/2021 0500   CALCIUM 8.2 (L) 01/02/2021 0500   GFRNONAA 5 (L) 01/02/2021 0500   GFRAA 16 (L) 05/26/2020 1158   Estimated Creatinine Clearance: 5.8 mL/min (A) (by C-G formula based on SCr of 8.41 mg/dL (H)).  COAG Lab Results  Component Value Date   INR 1.8 (H) 01/01/2021   INR 1.3 (H) 11/28/2020   INR 2.3 (H) 11/25/2020       ASSESSMENT/PLAN:  Left foot toes dry gangrene stable.  S/p left femoral to above-knee popliteal artery bypass with PTFE as the vein on the left was not adequate  Right TMA with retained staples with decreased edema B LE Erythema on the right heel, float heels off bed.  S/p right vein bypass.  Right medial thigh vein harvest site with yellow eschar, slowly healing.  Wet to dry dressing medial thigh.  Will order wet to dry dressing changes BID for now.    We will inform Dr. Scot Dock of her admission tomorrow.   Roxy Horseman 01/02/2021 12:36 PM  Agree with above.  Pt seen and examined.  Her leg and foot wounds are similar to that described by Dr Scot Dock from last week. Pt primary complaint is nausea.  Will have Dr Scot Dock re eval pt and follow as consult.  Ruta Hinds, MD [facility] [place] Office: [phone]

## 2021-01-02 NOTE — ED Notes (Signed)
Pt given a sandwich bag and a drink.  

## 2021-01-02 NOTE — ED Notes (Signed)
Patient transported to CT 

## 2021-01-02 NOTE — Consult Note (Signed)
[facility] Renal Consultation Note    Indication for Consultation:  Management of ESRD/hemodialysis; anemia, hypertension/volume and secondary hyperparathyroidism  HPI: Kathryn Hancock is a 70 y.o. female with ESRD on HD, CAD s/p CABG and atrial myxoma resection, A-fib (on Eliquis), T2DM, HTN, HL, PAD (s/p L fem-pop bypass 09/2020, R fem-pop bypass 10/31/20). She is admitted with L foot gangrene/early osteo and intractable nausea/vomiting. Presents with hematoma of right eye from retching. Alert and oriented on exam. Denies fall or trauma. No cp, sob. Empiric antibiotics started. Vascular consulted.   Nephrology consulted for dialysis needs. Dialysis at [facility] MWF. Last dialysis was Friday. She has not been reaching her dry weight d/t hypotensive episodes. Using dialysis catheter for HD. She has  LUE AVF but has not been successful cannulating.   Past Medical History:  Diagnosis Date   AAA (abdominal aortic aneurysm) ([place]) 06/2019   Multiple small pseudoaneurysmal projections of the dominant aorta.  Distal abdominal aortic aneurysm 4.5 x 4.7 cm.  Greatest AP dimension of the infrarenal aorta is 4.9.  No evidence of thoracic aortic aneurysm or dissection.  Brief segment of proximal IMA occlusion.   Anemia    Coronary artery disease involving native coronary artery of native heart without angina pectoris 06/01/2020   Cardiac cath at [facility] 06/01/2020-preop for atrial myxoma resection -> severe proximal RCA and PDA -> had single-vessel CABG with SVG-PDA along with myxoma resection.   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 05/27/2020   ESRD (end stage renal disease) on dialysis ([place]) 06/2020   Progression of CKDIIIb to ESRD initially related to thromboembolic event from left atrial myxoma; complicated by perioperative hypotension-; now 1 on HD TU/TH/SAT @ [place]   GERD (gastroesophageal reflux disease)    Heavy smoker (more than 20 cigarettes per day)    ~ 2 ppd; since age 81 (55 pk yr) = has cut down to one half PPD.>   Hyperlipidemia associated with type 2 diabetes mellitus ([place]) 08/05/2020   Hypertension    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 = Status post resection December 23,2021 (done at [facility] because of no bed availability at [facility]   Microscopic hematuria    PAF (paroxysmal atrial fibrillation) ([place]) 07/01/2020   Initially noted postoperatively-left atrial myxoma resection and CABG x1.  Now on amiodarone and apixaban..   Peripheral vascular disease ([place])    Bilateral SFA & ATA occlusion.  2 V runoffvia Peroneal A & PTA. --> s/p L Fem-Pop (AK pop A) bypass (PFTE) 09/12/2020 - pending R Fem-AKPop bypass   Plantar wart of right foot    PONV (postoperative nausea and vomiting)    Past Surgical History:  Procedure Laterality Date   ABDOMINAL AORTOGRAM W/LOWER EXTREMITY N/A 07/08/2020   Procedure: ABDOMINAL AORTOGRAM W/LOWER EXTREMITY;  Surgeon: Angelia Mould, MD;  Location: [place] CV LAB:  2 R Renal & 1 L Renal A. Known Para-Ren  Aneurysm. Bilat Com, Internal & External Iliacs - CFA& DFA patent.  Bilat SFA 100% @ origin - recon @ AK Pop A.; Bilateral 2 V runnoff - Bilateral Peroneal A & PTA patent w/ Bilat ATA CTO.   AV FISTULA PLACEMENT Left 07/11/2020   Procedure: LEFT FIRST STAGE BRACHIOBASILIC UPPER EXTREMITY ARTERIOVENOUS (AV) FISTULA CREATION;  Surgeon: [name], MD;  Location: [facility] OR;  Service: Vascular;  Laterality: Left;   BASCILIC VEIN TRANSPOSITION Left 10/03/2020   Procedure: BASILIC VEIN TRANSPOSITION SECOND STAGE LEFT;  Surgeon: Angelia Mould, MD;  Location: [place];  Service: Vascular;  Laterality: Left;   BIOPSY  10/01/2020   Procedure: BIOPSY;  Surgeon: Jackquline Denmark, MD;  Location: [facility]  ENDOSCOPY;  Service: Endoscopy;;   Cardiac MRI  01/22/2020   Normal LV size and function.  Moderate focal basal septal hypertrophy.  No S.A.M.  LVEF 61%.  RVEF 66%.  Large mobile mass in the left atrium attached to the interatrial septum-does not appear to thrombus characteristics consistent with left atrial myxoma.   Chest CTA  06/2019   Multiple small pseudoaneurysmal projections of the dominant aorta.  Distal abdominal aortic aneurysm 4.5 x 4.7 cm.  Greatest AP dimension of the infrarenal aorta is 4.9.  No evidence of thoracic aortic aneurysm or dissection.  Brief segment of proximal IMA occlusion.  Large geographic filling defect of the left atrium with associated dense radiopaque material.  Aortic atherosclerosis and emphysema   COLONOSCOPY W/ POLYPECTOMY     CORONARY ARTERY BYPASS GRAFT  06/02/2020   @ [facility] - Dr. Norm Parcel; SVG-rPDA along with Atrial Myxoma Resection.   ESOPHAGOGASTRODUODENOSCOPY (EGD) WITH PROPOFOL N/A 10/01/2020   Procedure: ESOPHAGOGASTRODUODENOSCOPY (EGD) WITH PROPOFOL;  Surgeon: Jackquline Denmark, MD;  Location: [facility] ENDOSCOPY;  Service: Endoscopy;  Laterality: N/A;   EYE SURGERY Bilateral    Cataract surgery   FEMORAL-POPLITEAL BYPASS GRAFT Left 09/12/2020   Procedure: LEFT FEMORAL-ABOVE KNEE POPLITEAL ARTERY BYPASS GRAFT;  Surgeon: Angelia Mould, MD;  Location: [place];  Service: Vascular;  Laterality: Left;   FEMORAL-POPLITEAL BYPASS GRAFT Right 10/31/2020   Procedure: RIGHT BYPASS GRAFT FEMORAL- ABOVE KNEE POPLITEAL ARTERY;  Surgeon: Angelia Mould, MD;  Location: [place];  Service: Vascular;  Laterality: Right;   HOT HEMOSTASIS N/A 10/01/2020   Procedure: HOT HEMOSTASIS (ARGON PLASMA COAGULATION/BICAP);  Surgeon: Jackquline Denmark, MD;  Location: [facility] ENDOSCOPY;  Service: Endoscopy;  Laterality: N/A;   I & D EXTREMITY Right 11/28/2020   Procedure: IRRIGATION AND DEBRIDEMENT RIGHT UPPER LEG WOUND;  Surgeon: Angelia Mould, MD;  Location: [facility] OR;  Service:  Vascular;  Laterality: Right;   INTRAOPERATIVE TRANSESOPHAGEAL ECHOCARDIOGRAM  06/02/2020   [facility] (LA Myxoma Resection & CABG x 1): Pre-op EF> 55%. Normal WM. Mild AI. Large LA mass with calcifications &minimal vascularity (5.92 x 4 cm @ largest. LA dilation. Diffuse atherosclerosis of descending Ao, focal calcification of Sinus of  Valsalva. -> Post-op. S/p Mass Excision - no residual mass in LA. No ASD or PFO.  EF >55% no WMA.   IR FLUORO GUIDE CV LINE RIGHT  07/06/2020   IR US GUIDE VASC ACCESS RIGHT  07/06/2020   LEFT ATRIAL MYXOMA RESECTION Left 06/02/2020   [facility]: Dr. Norm Parcel   LEFT HEART CATH AND CORONARY ANGIOGRAPHY  05/2020   [facility]: Biplane Coronary Angiography, Significant Prox RCA & rPDA disease. Normal CO/CI with elevated LVEDP.   TRANSESOPHAGEAL ECHOCARDIOGRAM  05/31/2020   [facility]:  Normal LV function.  No LAA thrombus.  Large mobile left atrial mass attached to the left atrial septum (4.8 x 3.1 cm with calcification)-most consistent with atrial myxoma.  EF estimated 55%.  Normal valves.   TRANSMETATARSAL AMPUTATION Right 11/28/2020   Procedure: RIGHT TRANSMETATARSAL AMPUTATION;  Surgeon: Angelia Mould, MD;  Location: [facility] OR;  Service: Vascular;  Laterality: Right;   TRANSTHORACIC ECHOCARDIOGRAM  07/2019    EF 60 to  65%.  GR 1 DD.  Elevated LVEDP.  Large size ill-defined echodensity in the left atrium suspicious for thrombus versus tumor.  Moderately dilated LA.   TRANSTHORACIC ECHOCARDIOGRAM  08/31/2020   (Postop left atrial myxoma resection) normal LVEF of 55-60%.  No R WMA.  GR 1 DD.  Mildly reduced RV function with normal PAP.  Trivial AI.  Left atrial myxoma no longer present.  Mitral valve stable trivial MR.   TUBAL LIGATION     Family History  Problem Relation Age of Onset   Diabetes Mother    Diabetes Father    Heart attack Father 69   CAD Father    Hyperlipidemia Father    Hypertension Father    Social History:  reports that she has been smoking cigarettes. She has  a 25.00 pack-year smoking history. She has never used smokeless tobacco. She reports previous alcohol use. She reports that she does not use drugs. Allergies  Allergen Reactions   Penicillins Anaphylaxis    Anaphylaxis as a child   Prior to Admission medications   Medication Sig Start Date End Date Taking? Authorizing Provider  acetaminophen (TYLENOL) 650 MG CR tablet Take 650 mg by mouth every 8 (eight) hours as needed for pain.   Yes [provider]  amiodarone (PACERONE) 200 MG tablet Take 200 mg by mouth every evening. 06/10/20 06/10/21 Yes [provider]  apixaban (ELIQUIS) 5 MG TABS tablet Take 1 tablet (5 mg total) by mouth 2 (two) times daily. 12/10/19  Yes Leonie Man, MD  Ascorbic Acid (VITAMIN C) 1000 MG tablet Take 1,000 mg by mouth daily.   Yes [provider]  aspirin EC 81 MG tablet Take 81 mg by mouth every evening. Swallow whole.   Yes [provider]  atorvastatin (LIPITOR) 20 MG tablet Take 20 mg by mouth every evening.   Yes [provider]  bisacodyl (DULCOLAX) 5 MG EC tablet Take 1 tablet (5 mg total) by mouth daily as needed for moderate constipation. 12/10/20  Yes Mercy Riding, MD  Dextrose, Diabetic Use, (INSTA-GLUCOSE) 77.4 % GEL Take 1 Dose by mouth daily as needed (BG<70).   Yes [provider]  docusate sodium (COLACE) 100 MG capsule Take 100 mg by mouth daily.   Yes [provider]  erythromycin ophthalmic ointment Place 1 application into the right eye 3 (three) times daily.   Yes [provider]  famotidine (PEPCID) 20 MG tablet Take 1 tablet (20 mg total) by mouth 2 (two) times daily. 12/13/20  Yes Vann, Jessica U, DO  fluticasone (FLONASE) 50 MCG/ACT nasal spray Place 2 sprays into both nostrils as needed for allergies or rhinitis.   Yes [provider]  folic acid (FOLVITE) 626 MCG tablet Take 800 mcg by mouth daily.   Yes [provider]  glucagon (GLUCAGEN) 1 MG SOLR  injection Inject 1 mg into the vein once as needed for low blood sugar. BG<70   Yes [provider]  hydrocortisone (ANUSOL-HC) 25 MG suppository Place 1 suppository (25 mg total) rectally 2 (two) times daily. 12/10/20  Yes Mercy Riding, MD  HYDROmorphone (DILAUDID) 4 MG tablet Take 1 tablet (4 mg total) by mouth every 4 (four) hours as needed for moderate pain (Patient is to receive TWO 4mg  tablet PO of Dilaudid ONE hour before dressing changes.). 12/12/20  Yes Vann, Jessica U, DO  insulin lispro (HUMALOG) 100 UNIT/ML KwikPen Inject 0-6 Units into the skin in the morning, at noon, in  the evening, and at bedtime. If 70-150=0 units, 151-200=1 unit, 201-250=2 units, 251-300=3 units, 301-350=4 units, 351-400=6 units over 400 Call MD   Yes [provider]  linagliptin (TRADJENTA) 5 MG TABS tablet Take 5 mg by mouth every evening.   Yes [provider]  loperamide (IMODIUM A-D) 2 MG tablet Take 2 mg by mouth 4 (four) times daily as needed for diarrhea or loose stools.   Yes [provider]  metoCLOPramide (REGLAN) 5 MG tablet Take 1 tablet (5 mg total) by mouth every 8 (eight) hours as needed for refractory nausea / vomiting. 12/10/20 12/10/21 Yes Mercy Riding, MD  montelukast (SINGULAIR) 10 MG tablet Take 10 mg by mouth daily as needed (allergies).   Yes [provider]  multivitamin (RENA-VIT) TABS tablet Take 1 tablet by mouth at bedtime. 12/10/20  Yes Mercy Riding, MD  Nutritional Supplements (,FEEDING SUPPLEMENT, PROSOURCE PLUS) liquid Take 30 mLs by mouth 3 (three) times daily between meals. 12/10/20  Yes Mercy Riding, MD  ondansetron (ZOFRAN) 4 MG tablet Take 1 tablet (4 mg total) by mouth daily as needed for up to 365 doses for nausea or vomiting. Patient taking differently: Take 8 mg by mouth every 12 (twelve) hours as needed for nausea or vomiting. 12/10/20  Yes Mercy Riding, MD  oxyCODONE-acetaminophen (PERCOCET/ROXICET) 5-325 MG tablet Take 1 tablet by mouth  every 3 (three) hours as needed for severe pain.   Yes [provider]  sevelamer carbonate (RENVELA) 800 MG tablet Take 1 tablet (800 mg total) by mouth 3 (three) times daily with meals. 12/10/20  Yes Mercy Riding, MD  traZODone (DESYREL) 50 MG tablet Take 1 tablet (50 mg total) by mouth at bedtime as needed for sleep. 12/10/20  Yes Mercy Riding, MD  Darbepoetin Alfa (ARANESP) 200 MCG/0.4ML SOSY injection Inject 0.4 mLs (200 mcg total) into the vein every Monday with hemodialysis. 12/12/20   Mercy Riding, MD  glucose blood test strip 1 each by Other route as needed. Use as instructed    [provider]  lidocaine-prilocaine (EMLA) cream Apply 1 application topically daily as needed (dialysis). 08/12/20   [provider]   Current Facility-Administered Medications  Medication Dose Route Frequency Provider Last Rate Last Admin   amiodarone (PACERONE) tablet 200 mg  200 mg Oral QPM Rise Patience, MD       aspirin EC tablet 81 mg  81 mg Oral QPM Rise Patience, MD       atorvastatin (LIPITOR) tablet 20 mg  20 mg Oral QPM Rise Patience, MD       ceFEPIme (MAXIPIME) 1 g in sodium chloride 0.9 % 100 mL IVPB  1 g Intravenous Q24H Rise Patience, MD       [START ON 01/03/2021] Chlorhexidine Gluconate Cloth 2 % PADS 6 each  6 each Topical Q0600 Lynnda Child, PA-C       heparin ADULT infusion 100 units/mL (25000 units/271mL)  1,050 Units/hr Intravenous Continuous Heloise Purpura, RPH 8.5 mL/hr at 01/02/21 1119 850 Units/hr at 01/02/21 1119   metoCLOPramide (REGLAN) injection 5 mg  5 mg Intravenous Once Rise Patience, MD       ondansetron [facility]) tablet 4 mg  4 mg Oral Q6H PRN Rise Patience, MD       Or   ondansetron [facility]) injection 4 mg  4 mg Intravenous Q6H PRN Rise Patience, MD   4 mg at 01/02/21 [phone]  sevelamer carbonate (RENVELA) tablet 800 mg  800 mg Oral TID WC Rise Patience, MD   800 mg at 01/02/21 1256    vancomycin (VANCOREADY) IVPB 750 mg/150 mL  750 mg Intravenous Q M,W,F-HD Rise Patience, MD 150 mL/hr at 01/02/21 1254 750 mg at 01/02/21 1254   Current Outpatient Medications  Medication Sig Dispense Refill   acetaminophen (TYLENOL) 650 MG CR tablet Take 650 mg by mouth every 8 (eight) hours as needed for pain.     amiodarone (PACERONE) 200 MG tablet Take 200 mg by mouth every evening.     apixaban (ELIQUIS) 5 MG TABS tablet Take 1 tablet (5 mg total) by mouth 2 (two) times daily. 60 tablet 6   Ascorbic Acid (VITAMIN C) 1000 MG tablet Take 1,000 mg by mouth daily.     aspirin EC 81 MG tablet Take 81 mg by mouth every evening. Swallow whole.     atorvastatin (LIPITOR) 20 MG tablet Take 20 mg by mouth every evening.     bisacodyl (DULCOLAX) 5 MG EC tablet Take 1 tablet (5 mg total) by mouth daily as needed for moderate constipation. 30 tablet 0   Dextrose, Diabetic Use, (INSTA-GLUCOSE) 77.4 % GEL Take 1 Dose by mouth daily as needed (BG<70).     docusate sodium (COLACE) 100 MG capsule Take 100 mg by mouth daily.     erythromycin ophthalmic ointment Place 1 application into the right eye 3 (three) times daily.     famotidine (PEPCID) 20 MG tablet Take 1 tablet (20 mg total) by mouth 2 (two) times daily.     fluticasone (FLONASE) 50 MCG/ACT nasal spray Place 2 sprays into both nostrils as needed for allergies or rhinitis.     folic acid (FOLVITE) 638 MCG tablet Take 800 mcg by mouth daily.     glucagon (GLUCAGEN) 1 MG SOLR injection Inject 1 mg into the vein once as needed for low blood sugar. BG<70     hydrocortisone (ANUSOL-HC) 25 MG suppository Place 1 suppository (25 mg total) rectally 2 (two) times daily. 12 suppository 0   HYDROmorphone (DILAUDID) 4 MG tablet Take 1 tablet (4 mg total) by mouth every 4 (four) hours as needed for moderate pain (Patient is to receive TWO 4mg  tablet PO of Dilaudid ONE hour before dressing changes.). 6 tablet 0   insulin lispro (HUMALOG) 100 UNIT/ML KwikPen  Inject 0-6 Units into the skin in the morning, at noon, in the evening, and at bedtime. If 70-150=0 units, 151-200=1 unit, 201-250=2 units, 251-300=3 units, 301-350=4 units, 351-400=6 units over 400 Call MD     linagliptin (TRADJENTA) 5 MG TABS tablet Take 5 mg by mouth every evening.     loperamide (IMODIUM A-D) 2 MG tablet Take 2 mg by mouth 4 (four) times daily as needed for diarrhea or loose stools.     metoCLOPramide (REGLAN) 5 MG tablet Take 1 tablet (5 mg total) by mouth every 8 (eight) hours as needed for refractory nausea / vomiting. 90 tablet 1   montelukast (SINGULAIR) 10 MG tablet Take 10 mg by mouth daily as needed (allergies).     multivitamin (RENA-VIT) TABS tablet Take 1 tablet by mouth at bedtime.  0   Nutritional Supplements (,FEEDING SUPPLEMENT, PROSOURCE PLUS) liquid Take 30 mLs by mouth 3 (three) times daily between meals.     ondansetron (ZOFRAN) 4 MG tablet Take 1 tablet (4 mg total) by mouth daily as needed for up to 365 doses for nausea or vomiting. (Patient  taking differently: Take 8 mg by mouth every 12 (twelve) hours as needed for nausea or vomiting.) 20 tablet 0   oxyCODONE-acetaminophen (PERCOCET/ROXICET) 5-325 MG tablet Take 1 tablet by mouth every 3 (three) hours as needed for severe pain.     sevelamer carbonate (RENVELA) 800 MG tablet Take 1 tablet (800 mg total) by mouth 3 (three) times daily with meals.     traZODone (DESYREL) 50 MG tablet Take 1 tablet (50 mg total) by mouth at bedtime as needed for sleep.     Darbepoetin Alfa (ARANESP) 200 MCG/0.4ML SOSY injection Inject 0.4 mLs (200 mcg total) into the vein every Monday with hemodialysis. 1.68 mL    glucose blood test strip 1 each by Other route as needed. Use as instructed     lidocaine-prilocaine (EMLA) cream Apply 1 application topically daily as needed (dialysis).       ROS: As per HPI otherwise negative.  Physical Exam: Vitals:   01/02/21 0900 01/02/21 1000 01/02/21 1100 01/02/21 1200  BP: (!) 142/55  (!) 144/61 (!) 149/62 (!) 177/64  Pulse: 65 69 68 66  Resp: 16 13 19 17   Temp:      TempSrc:      SpO2: 99% 100% 100% 98%     General: Chronically ill appearing,nad  Head: Right orbit swelling/erythema. Conjunctival hemorrhage  Neck: Supple. No JVD  Lungs: CTA bilaterally without wheezes, rales, or rhonchi. Breathing is unlabored. Heart: RRR with S1 S2 Abdomen: soft non-tender  Lower extremities: 2+ pitting edema to thighs. R TMA; L foot with dry gangrene to toes Neuro: A & O  X 3. Moves all extremities spontaneously. Psych:  Responds to questions appropriately with a normal affect. Dialysis Access: R IJ TDC; LUE AVF- significant bruising to arms  Labs: Basic Metabolic Panel: Recent Labs  Lab 01/01/21 1434 01/02/21 0500  NA 130* 128*  K 4.7 4.7  CL 87* 88*  CO2 23 19*  GLUCOSE 110* 106*  BUN 72* 80*  CREATININE 8.19* 8.41*  CALCIUM 9.1 8.2*   Liver Function Tests: Recent Labs  Lab 01/01/21 1434 01/02/21 0500  AST 13* 11*  ALT 10 9  ALKPHOS 117 84  BILITOT 0.8 0.9  PROT 6.1* 4.5*  ALBUMIN 2.5* 1.9*   Recent Labs  Lab 01/01/21 1434  LIPASE 29   No results for input(s): AMMONIA in the last 168 hours. CBC: Recent Labs  Lab 01/01/21 1434 01/02/21 0500  WBC 19.4* 13.1*  NEUTROABS 17.3*  --   HGB 10.6* 8.3*  HCT 33.1* 26.1*  MCV 99.1 99.6  PLT 292 201   Cardiac Enzymes: No results for input(s): CKTOTAL, CKMB, CKMBINDEX, TROPONINI in the last 168 hours. CBG: Recent Labs  Lab 01/02/21 0829 01/02/21 0906 01/02/21 0957 01/02/21 1054 01/02/21 1254  GLUCAP 82 80 122* 75 54*   Iron Studies: No results for input(s): IRON, TIBC, TRANSFERRIN, FERRITIN in the last 72 hours. Studies/Results: DG Chest 2 View  Result Date: 01/01/2021 CLINICAL DATA:  weakness EXAM: CHEST - 2 VIEW COMPARISON:  November 25, 2020 FINDINGS: The cardiomediastinal silhouette is unchanged in contour.Status post median sternotomy and CABG. Atherosclerotic calcifications of the tortuous  thoracic aorta. RIGHT chest CVC tip terminates over the superior cavoatrial junction. No pleural effusion. No pneumothorax. No acute pleuroparenchymal abnormality. Visualized abdomen is unremarkable. Mild degenerative changes of the thoracic spine. IMPRESSION: No acute cardiopulmonary abnormality. Electronically Signed   By: Valentino Saxon MD   On: 01/01/2021 15:01   CT Head Wo Contrast  Result Date:  01/01/2021 CLINICAL DATA:  Weakness, vomiting, sacral pain and right Peri orbital ecchymosis. EXAM: CT HEAD WITHOUT CONTRAST TECHNIQUE: Contiguous axial images were obtained from the base of the skull through the vertex without intravenous contrast. COMPARISON:  None. FINDINGS: Brain: There is mild cerebral atrophy with widening of the extra-axial spaces and ventricular dilatation. There are areas of decreased attenuation within the white matter tracts of the supratentorial brain, consistent with microvascular disease changes. Small, chronic bilateral basal ganglia lacunar infarcts are seen. Vascular: No hyperdense vessel or unexpected calcification. Skull: Normal. Negative for acute fracture. Sinuses/Orbits: No acute finding. Other: None. IMPRESSION: 1. Generalized cerebral atrophy. 2. No acute intracranial abnormality. Electronically Signed   By: Virgina Norfolk M.D.   On: 01/01/2021 22:13   CT FOOT LEFT WO CONTRAST  Result Date: 01/02/2021 CLINICAL DATA:  Concern for osteomyelitis EXAM: CT OF THE LEFT FOOT WITHOUT CONTRAST TECHNIQUE: Multidetector CT imaging of the left foot was performed according to the standard protocol. Multiplanar CT image reconstructions were also generated. COMPARISON:  Radiograph 01/01/2021 FINDINGS: Bones/Joint/Cartilage Diffuse edematous changes of the lower extremity with more focal ulceration extending to the cortical surface of the first distal phalanx about the level of the proximal nail plate. Some conspicuous subcortical osteopenia in this vicinity without clear osseous  erosion or destruction. More focal Transcortical lucency and subcortical osteopenia involving the medial head of the first metatarsal with some associated joint fluid suspected on soft tissue windowing. Could reflect early osteomyelitis or septic arthritis. Additional superficial ulceration of the calcaneus posteriorly. No subjacent erosion or destruction. No other convincing CT evidence sites concerning for osteomyelitis. Background of diffuse mild degenerative arthrosis throughout the 4, mid and hindfoot. Corticated os peroneum is noted. Ligaments Suboptimally assessed by CT. Muscles and Tendons No clear intramuscular abscess or collection. No musculotendinous discontinuity is seen. Some hazy stranding seen along the flexor and extensor tendon sheaths could reflect some mild synovitis in the appropriate clinical setting. Soft tissues Focus of soft tissue gas seen involving the nail plate of the first digit, as described above. No other soft tissue gas. No organized collection or abscess. Additional sites of ulceration are poorly visualized Tiny linear metallic radiodensity seen along the plantar aspect of the foot towards the level of the third proximal phalanx. Could reflect a small foreign body or prior surgical material. IMPRESSION: 1. Focus of soft tissue gas involving the nail plate of the first digit with some slightly conspicuous subcortical osteopenia at the first distal phalanx. Could reflect some reactive marrow changes or possible early osteomyelitis in the appropriate clinical setting. 2. Additional conspicuous transcortical lucency and subcortical osteopenia involving the medial aspect of the head of the first metatarsal and questionable joint fluid at this level as well which could reflect a septic arthritis with osteomyelitis. 3. Additional focal ulceration superficial to the posterior calcaneus without subjacent osseous erosion or destructive change to suggest active osteomyelitis at this time. 4.  Tiny metallic foreign body versus surgical clips seen along the plantar soft tissues near the base of the third proximal phalanx. No organized abscess, collection or other soft tissue gas. 5. Exam is complicated by the presence of a heterogeneously demineralized appearance of the bones suggesting some underlying osteopenia. Electronically Signed   By: Lovena Le M.D.   On: 01/02/2021 03:14   DG Foot Complete Left  Result Date: 01/01/2021 CLINICAL DATA:  weakness EXAM: LEFT FOOT - COMPLETE 3+ VIEW COMPARISON:  None. FINDINGS: No acute fracture or dislocation. Minimal degenerative changes of the first  MTP. No area of erosion or osseous destruction. There is a 3 mm linear radiopaque density in the plantar soft tissues between the third and fourth proximal phalanges. Soft tissue edema. IMPRESSION: 1. There is a 3 mm linear radiopaque density projecting over the soft tissues of the plantar aspect between the third and fourth phalanges. This may reflect a retained foreign body. 2. No radiographic evidence of osteomyelitis. Electronically Signed   By: Valentino Saxon MD   On: 01/01/2021 15:00   CT CHEST ABDOMEN PELVIS WO CONTRAST  Result Date: 01/01/2021 CLINICAL DATA:  Weakness. Emesis. Complaining of sacral pain. Flank pain. Reported bruising to the right chest wall and abdomen. Sacral wounds. EXAM: CT CHEST, ABDOMEN AND PELVIS WITHOUT CONTRAST TECHNIQUE: Multidetector CT imaging of the chest, abdomen and pelvis was performed following the standard protocol without IV contrast. COMPARISON:  07/01/2020 FINDINGS: CT CHEST FINDINGS Cardiovascular: Prior CABG surgery. Heart normal in size. Three-vessel coronary artery calcifications. No pericardial effusion. Aortic atherosclerosis. No aneurysm. Right sided dual lumen central catheter, tip in the lower superior vena cava. Mediastinum/Nodes: No enlarged mediastinal, hilar, or axillary lymph nodes. Thyroid gland, trachea, and esophagus demonstrate no significant  findings. Lungs/Pleura: Minimal subsegmental atelectasis in the dependent lower lobes. Lungs otherwise clear. No pleural effusion or pneumothorax. Musculoskeletal: No fracture or acute finding.  No bone lesion. CT ABDOMEN PELVIS FINDINGS Hepatobiliary: Normal liver. Dependent gallstones. No gallbladder wall thickening or evidence of acute cholecystitis. No bile duct dilation. Pancreas: Unremarkable. No pancreatic ductal dilatation or surrounding inflammatory changes. Spleen: Normal in size without focal abnormality. Adrenals/Urinary Tract: Bilateral adrenal gland thickening consistent with hyperplasia, stable. Kidneys normal in position. No renal masses. No collecting system stones and no hydronephrosis. Normal ureters. Normal bladder. Stomach/Bowel: Stomach is within normal limits. Appendix appears normal. No evidence of bowel wall thickening, distention, or inflammatory changes. Vascular/Lymphatic: Irregular infrarenal abdominal aortic aneurysm, maximum 5.7 x 5.3 cm transversely, without significant change from the prior study. There are multiple aortic outpouchings that are also stable. No enlarged lymph nodes. Reproductive: Uterus and bilateral adnexa are unremarkable. Other: No abdominal wall hernia or abnormality. No abdominopelvic ascites. Musculoskeletal: Limited soft tissue covering lower sacrum and coccyx consistent with an overlying sacral decubitus ulcer. No evidence of an abscess. No bone resorption to suggest osteomyelitis. No fractures or bone lesions. There is soft tissue inflammation lateral to the left proximal femur and hip. IMPRESSION: 1. No acute findings in the chest. 2. Limited soft tissue overlying the lower sacrum and coccyx consistent with a sacral decubitus ulcer, but without evidence of an abscess or underlying osteomyelitis. 3. Soft tissue inflammation lateral to the left hip, nonspecific. No defined ulcer or evidence of an abscess. 4. No acute findings within the abdomen or pelvis. 5.  Abdominal aortic aneurysm with multiple accessory outpatient chains along the aorta. Current aorta measures a maximum of 5.6 x 5.3 cm transversely, 5.3 cm anterior-posterior, previously 4.9 cm measured in the same location. Recommend follow-up CT/MR every 6 months and vascular consultation. This recommendation follows ACR consensus guidelines: White Paper of the ACR Incidental Findings Committee II on Vascular Findings. J Am Coll Radiol 2013; 10:789-794. Electronically Signed   By: Lajean Manes M.D.   On: 01/01/2021 22:27   CT Maxillofacial Wo Contrast  Result Date: 01/01/2021 CLINICAL DATA:  Facial trauma. EXAM: CT MAXILLOFACIAL WITHOUT CONTRAST TECHNIQUE: Multidetector CT imaging of the maxillofacial structures was performed. Multiplanar CT image reconstructions were also generated. COMPARISON:  None. FINDINGS: Osseous: No fracture or mandibular dislocation. No destructive  process. Orbits: Negative. No traumatic or inflammatory intraorbital finding. Sinuses: Clear. Soft tissues: Mild right periorbital soft tissue swelling is noted with an associated 2.1 cm x 1.2 cm predominantly inferior-medial right preseptal hematoma. A 5 mm x 2 mm focus of preseptal air is also seen. Limited intracranial: No significant or unexpected finding. IMPRESSION: 1. Mild RIGHT periorbital soft tissue swelling with an associated small RIGHT preseptal hematoma. Electronically Signed   By: Virgina Norfolk M.D.   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 Vascular consulted. Following.  R eye hematoma - No acute findings on head CT. Per primary  Intractable nausea/vomiting -per primary  ESRD -  HD MWF. No urgent dialysis needs today. Plan for HD Tuesday d/t high inpatient HD census.  Hypertension/volume  -  Has't been reaching dry weight as outpatient. 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

## 2021-01-02 NOTE — ED Notes (Signed)
Pt. Assisted to sitting up position in bed to eat.

## 2021-01-02 NOTE — Progress Notes (Signed)
ANTICOAGULATION CONSULT NOTE - Initial Consult  Pharmacy Consult for heparin Indication: atrial fibrillation  Allergies  Allergen Reactions   Penicillins Anaphylaxis    Anaphylaxis as a child    Patient Measurements:   Heparin Dosing Weight: 62.9 kg  Vital Signs: BP: 159/77 (07/25 2145) Pulse Rate: 65 (07/25 2145)  Labs: Recent Labs    01/01/21 1434 01/01/21 1842 01/02/21 0500 01/02/21 1036 01/02/21 2220  HGB 10.6*  --  8.3*  --   --   HCT 33.1*  --  26.1*  --   --   PLT 292  --  201  --   --   APTT  --   --   --  34 33  LABPROT  --  21.0*  --   --   --   INR  --  1.8*  --   --   --   HEPARINUNFRC  --   --   --  >1.10* >1.10*  CREATININE 8.19*  --  8.41*  --   --      Estimated Creatinine Clearance: 5.8 mL/min (A) (by C-G formula based on SCr of 8.41 mg/dL (H)).   Medical History: Past Medical History:  Diagnosis Date   AAA (abdominal aortic aneurysm) ([place]) 06/2019   Multiple small pseudoaneurysmal projections of the dominant aorta.  Distal abdominal aortic aneurysm 4.5 x 4.7 cm.  Greatest AP dimension of the infrarenal aorta is 4.9.  No evidence of thoracic aortic aneurysm or dissection.  Brief segment of proximal IMA occlusion.   Anemia    Coronary artery disease involving native coronary artery of native heart without angina pectoris 06/01/2020   Cardiac cath at [facility] 06/01/2020-preop for atrial myxoma resection -> severe proximal RCA and PDA -> had single-vessel CABG with SVG-PDA along with myxoma resection.   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 = Status post resection December 23,2021 (done at [facility] because of no bed availability at [facility]   Microscopic hematuria    PAF (paroxysmal atrial fibrillation) ([place]) 07/01/2020   Initially noted postoperatively-left atrial myxoma resection and CABG x1.  Now on amiodarone and apixaban..   Peripheral vascular disease ([place])    Bilateral SFA & ATA occlusion.  2 V runoffvia Peroneal A & PTA. --> s/p L Fem-Pop (AK pop A) bypass (PFTE) 09/12/2020 - pending R Fem-AKPop bypass   Plantar wart of right foot    PONV (postoperative nausea and vomiting)     Medications:  See medication history  Assessment: 70 yof on eliquis PTA to start heparin. Patient is ESRD on dialysis.  Notably patient is found to have a right subconjunctival hemorrhage. CT Chest also notable for AAA without rupture.  Apixaban last dose prior to arrival  7/24 @ 0900. Baseline PT/INR 21 / 1.8. Heparin level remains > 1.1. aPTT remains subtherapeutic at 33  Goal of Therapy:  aPTT 66-102 seconds Heparin level 0.3-0.7 units/ml Monitor platelets by anticoagulation protocol: Yes   Plan:  Will increase heparin to 1300 units/hr Still no bolus at this time Check aPTT and heparin level in ~8 hours Monitor for bleeding complications Continue to monitor both aPTT and heparin levels until corresponding  Lorelei Pont, PharmD, BCPS 01/02/2021 11:23 PM ED Clinical Pharmacist -  [phone]

## 2021-01-02 NOTE — ED Notes (Signed)
Dialysis RN called to inform that pt's dialysis will be rescheduled until tomorrow

## 2021-01-02 NOTE — ED Notes (Signed)
Pt ate 3 bites of broth and finished jello but then became nauseated.

## 2021-01-03 ENCOUNTER — Inpatient Hospital Stay (HOSPITAL_COMMUNITY): Payer: Medicare Other

## 2021-01-03 DIAGNOSIS — I480 Paroxysmal atrial fibrillation: Secondary | ICD-10-CM

## 2021-01-03 DIAGNOSIS — R112 Nausea with vomiting, unspecified: Secondary | ICD-10-CM | POA: Diagnosis not present

## 2021-01-03 DIAGNOSIS — N186 End stage renal disease: Secondary | ICD-10-CM | POA: Diagnosis not present

## 2021-01-03 DIAGNOSIS — D151 Benign neoplasm of heart: Secondary | ICD-10-CM

## 2021-01-03 LAB — RENAL FUNCTION PANEL
Albumin: 1.9 g/dL — ABNORMAL LOW (ref 3.5–5.0)
Anion gap: 21 — ABNORMAL HIGH (ref 5–15)
BUN: 86 mg/dL — ABNORMAL HIGH (ref 8–23)
CO2: 19 mmol/L — ABNORMAL LOW (ref 22–32)
Calcium: 8.5 mg/dL — ABNORMAL LOW (ref 8.9–10.3)
Chloride: 91 mmol/L — ABNORMAL LOW (ref 98–111)
Creatinine, Ser: 9.2 mg/dL — ABNORMAL HIGH (ref 0.44–1.00)
GFR, Estimated: 4 mL/min — ABNORMAL LOW (ref >60)
Glucose, Bld: 78 mg/dL (ref 70–99)
Phosphorus: 6.8 mg/dL — ABNORMAL HIGH (ref 2.5–4.6)
Potassium: 5.5 mmol/L — ABNORMAL HIGH (ref 3.5–5.1)
Sodium: 131 mmol/L — ABNORMAL LOW (ref 135–145)

## 2021-01-03 LAB — IRON AND TIBC
Iron: 28 ug/dL (ref 28–170)
Saturation Ratios: 25 % (ref 10.4–31.8)
TIBC: 113 ug/dL — ABNORMAL LOW (ref 250–450)
UIBC: 85 ug/dL

## 2021-01-03 LAB — CBG MONITORING, ED
Glucose-Capillary: 69 mg/dL — ABNORMAL LOW (ref 70–99)
Glucose-Capillary: 73 mg/dL (ref 70–99)
Glucose-Capillary: 72 mg/dL (ref 70–99)

## 2021-01-03 LAB — CBC WITH DIFFERENTIAL/PLATELET
Abs Immature Granulocytes: 0.12 10*3/uL — ABNORMAL HIGH (ref 0.00–0.07)
Basophils Absolute: 0 10*3/uL (ref 0.0–0.1)
Basophils Relative: 0 %
Eosinophils Absolute: 0.1 10*3/uL (ref 0.0–0.5)
Eosinophils Relative: 1 %
HCT: 26.5 % — ABNORMAL LOW (ref 36.0–46.0)
Hemoglobin: 8.5 g/dL — ABNORMAL LOW (ref 12.0–15.0)
Immature Granulocytes: 1 %
Lymphocytes Relative: 8 %
Lymphs Abs: 1 10*3/uL (ref 0.7–4.0)
MCH: 32 pg (ref 26.0–34.0)
MCHC: 32.1 g/dL (ref 30.0–36.0)
MCV: 99.6 fL (ref 80.0–100.0)
Monocytes Absolute: 0.6 10*3/uL (ref 0.1–1.0)
Monocytes Relative: 5 %
Neutro Abs: 9.9 10*3/uL — ABNORMAL HIGH (ref 1.7–7.7)
Neutrophils Relative %: 85 %
Platelets: 179 10*3/uL (ref 150–400)
RBC: 2.66 MIL/uL — ABNORMAL LOW (ref 3.87–5.11)
RDW: 17.6 % — ABNORMAL HIGH (ref 11.5–15.5)
WBC: 11.8 10*3/uL — ABNORMAL HIGH (ref 4.0–10.5)
nRBC: 0 % (ref 0.0–0.2)

## 2021-01-03 LAB — VITAMIN B12: Vitamin B-12: 232 pg/mL (ref 180–914)

## 2021-01-03 LAB — FOLATE: Folate: >100 ng/mL (ref >5.9)

## 2021-01-03 LAB — GLUCOSE, CAPILLARY
Glucose-Capillary: 86 mg/dL (ref 70–99)
Glucose-Capillary: 88 mg/dL (ref 70–99)
Glucose-Capillary: 76 mg/dL (ref 70–99)
Glucose-Capillary: 137 mg/dL — ABNORMAL HIGH (ref 70–99)

## 2021-01-03 LAB — APTT
aPTT: >200 seconds (ref 24–36)
aPTT: >200 seconds (ref 24–36)

## 2021-01-03 LAB — HEPARIN LEVEL (UNFRACTIONATED): Heparin Unfractionated: >1.1 IU/mL — ABNORMAL HIGH (ref 0.30–0.70)

## 2021-01-03 LAB — FERRITIN: Ferritin: 710 ng/mL — ABNORMAL HIGH (ref 11–307)

## 2021-01-03 MED ORDER — HEPARIN SODIUM (PORCINE) 1000 UNIT/ML IJ SOLN
INTRAMUSCULAR | Status: AC
  Filled 2021-01-03: qty 4

## 2021-01-03 MED ORDER — VANCOMYCIN VARIABLE DOSE PER UNSTABLE RENAL FUNCTION (PHARMACIST DOSING): Status: DC

## 2021-01-03 MED ORDER — VANCOMYCIN HCL IN DEXTROSE 750-5 MG/150ML-% IV SOLN
750.0000 mg | Freq: Once | INTRAVENOUS | Status: DC
  Filled 2021-01-03: qty 150

## 2021-01-03 MED ORDER — ALTEPLASE 2 MG IJ SOLR: 2.0000 mg | Freq: Once | INTRAMUSCULAR | Status: DC | PRN

## 2021-01-03 MED ORDER — VANCOMYCIN HCL IN DEXTROSE 750-5 MG/150ML-% IV SOLN
750.0000 mg | INTRAVENOUS | Status: DC
  Administered 2021-01-06: 750 mg via INTRAVENOUS
  Filled 2021-01-03 (×2): qty 150

## 2021-01-03 MED ORDER — SODIUM CHLORIDE 0.9 % IV SOLN: 100.0000 mL | INTRAVENOUS | Status: DC | PRN

## 2021-01-03 MED ORDER — HEPARIN SODIUM (PORCINE) 1000 UNIT/ML DIALYSIS
1000.0000 [IU] | INTRAMUSCULAR | Status: DC | PRN
  Filled 2021-01-03: qty 1

## 2021-01-03 MED ORDER — ONDANSETRON HCL 4 MG/2ML IJ SOLN
INTRAMUSCULAR | Status: AC
  Filled 2021-01-03: qty 2

## 2021-01-03 MED ORDER — LIDOCAINE-PRILOCAINE 2.5-2.5 % EX CREA
1.0000 "application " | TOPICAL_CREAM | CUTANEOUS | Status: DC | PRN
  Filled 2021-01-03: qty 5

## 2021-01-03 MED ORDER — APIXABAN 5 MG PO TABS
5.0000 mg | ORAL_TABLET | Freq: Two times a day (BID) | ORAL | Status: DC
  Administered 2021-01-04 – 2021-01-09 (×12): 5 mg via ORAL
  Filled 2021-01-03 (×12): qty 1

## 2021-01-03 MED ORDER — VANCOMYCIN HCL 750 MG/150ML IV SOLN
INTRAVENOUS | Status: AC
  Administered 2021-01-03: 750 mg via INTRAVENOUS
  Filled 2021-01-03: qty 150

## 2021-01-03 MED ORDER — PENTAFLUOROPROP-TETRAFLUOROETH EX AERO: 1.0000 "application " | INHALATION_SPRAY | CUTANEOUS | Status: DC | PRN

## 2021-01-03 MED ORDER — LIDOCAINE HCL (PF) 1 % IJ SOLN
5.0000 mL | INTRAMUSCULAR | Status: DC | PRN
Start: 2021-01-03 — End: 2021-01-03

## 2021-01-03 NOTE — Progress Notes (Signed)
VASCULAR SURGERY:  The patient is well-known to me.  She has bilateral femoropopliteal bypass graft.  She is had a transmetatarsal amputation on the right and has dry gangrene of the toes of the left foot.  These wounds have not changed since I saw her last in the office.  When she was in the hospital previously she was being evaluated by hospice as it is felt that her life expectancy is quite short.  She also has a large wound on her back.  She is admitted with failure to thrive.  Vascular surgery will follow peripherally.  Gae Gallop, MD 4:58 PM

## 2021-01-03 NOTE — Plan of Care (Signed)
  Problem: Education: Goal: Knowledge of disease and its progression will improve Outcome: Progressing   Problem: Fluid Volume: Goal: Compliance with measures to maintain balanced fluid volume will improve Outcome: Progressing   Problem: Health Behavior/Discharge Planning: Goal: Ability to manage health-related needs will improve Outcome: Progressing   Problem: Nutritional: Goal: Ability to make healthy dietary choices will improve Outcome: Progressing   Problem: Clinical Measurements: Goal: Complications related to the disease process, condition or treatment will be avoided or minimized Outcome: Progressing   Problem: Clinical Measurements: Goal: Will remain free from infection Outcome: Progressing   Problem: Activity: Goal: Risk for activity intolerance will decrease Outcome: Progressing   Problem: Nutrition: Goal: Adequate nutrition will be maintained Outcome: Progressing   Problem: Coping: Goal: Level of anxiety will decrease Outcome: Progressing   Problem: Skin Integrity: Goal: Risk for impaired skin integrity will decrease Outcome: Progressing   Problem: Education: Goal: Knowledge of disease and its progression will improve Outcome: Progressing   Problem: Fluid Volume: Goal: Compliance with measures to maintain balanced fluid volume will improve Outcome: Progressing   Problem: Health Behavior/Discharge Planning: Goal: Ability to manage health-related needs will improve Outcome: Progressing   Problem: Nutritional: Goal: Ability to make healthy dietary choices will improve Outcome: Progressing   Problem: Clinical Measurements: Goal: Complications related to the disease process, condition or treatment will be avoided or minimized Outcome: Progressing   Problem: Clinical Measurements: Goal: Will remain free from infection Outcome: Progressing   Problem: Activity: Goal: Risk for activity intolerance will decrease Outcome: Progressing   Problem:  Nutrition: Goal: Adequate nutrition will be maintained Outcome: Progressing   Problem: Coping: Goal: Level of anxiety will decrease Outcome: Progressing   Problem: Skin Integrity: Goal: Risk for impaired skin integrity will decrease Outcome: Progressing

## 2021-01-03 NOTE — Plan of Care (Signed)
  Problem: Education: Goal: Knowledge of General Education information will improve Description Including pain rating scale, medication(s)/side effects and non-pharmacologic comfort measures Outcome: Progressing   

## 2021-01-03 NOTE — Progress Notes (Signed)
ANTICOAGULATION CONSULT NOTE - Follow Up Consult  Pharmacy Consult for heparin Indication: atrial fibrillation  Allergies  Allergen Reactions   Penicillins Anaphylaxis    Anaphylaxis as a child    Patient Measurements:   Heparin Dosing Weight: 62.9 kg  Vital Signs: Temp: 98.5 F (36.9 C) (07/26 1054) Temp Source: Oral (07/26 1054) BP: 129/63 (07/26 1054) Pulse Rate: 74 (07/26 1054)  Labs: Recent Labs    01/01/21 1434 01/01/21 1842 01/02/21 0500 01/02/21 1036 01/02/21 1036 01/02/21 2220 01/03/21 0414 01/03/21 0415 01/03/21 0800  HGB 10.6*  --  8.3*  --   --   --  8.5*  --   --   HCT 33.1*  --  26.1*  --   --   --  26.5*  --   --   PLT 292  --  201  --   --   --  179  --   --   APTT  --   --   --  34   < > 33 >200*  --  >200*  LABPROT  --  21.0*  --   --   --   --   --   --   --   INR  --  1.8*  --   --   --   --   --   --   --   HEPARINUNFRC  --   --   --  >1.10*  --  >1.10*  --  >1.10*  --   CREATININE 8.19*  --  8.41*  --   --   --   --  9.20*  --    < > = values in this interval not displayed.     Estimated Creatinine Clearance: 5.3 mL/min (A) (by C-G formula based on SCr of 9.2 mg/dL (H)).   Medical History: Past Medical History:  Diagnosis Date   AAA (abdominal aortic aneurysm) ([place]) 06/2019   Multiple small pseudoaneurysmal projections of the dominant aorta.  Distal abdominal aortic aneurysm 4.5 x 4.7 cm.  Greatest AP dimension of the infrarenal aorta is 4.9.  No evidence of thoracic aortic aneurysm or dissection.  Brief segment of proximal IMA occlusion.   Anemia    Coronary artery disease involving native coronary artery of native heart without angina pectoris 06/01/2020   Cardiac cath at [facility] 06/01/2020-preop for atrial myxoma resection -> severe proximal RCA and PDA -> had single-vessel CABG with SVG-PDA along with myxoma resection.   Depression    DM (diabetes mellitus), type 2 with renal complications ([place]) 10/62/6948   Dry gangrene ([place]) -> right  foot-toes 05/27/2020   ESRD (end stage renal disease) on dialysis ([place]) 06/2020   Progression of CKDIIIb to ESRD initially related to thromboembolic event from left atrial myxoma; complicated by perioperative hypotension-; now 1 on HD TU/TH/SAT @ [place]   GERD (gastroesophageal reflux disease)    Heavy smoker (more than 20 cigarettes per day)    ~ 2 ppd; since age 72 (22 pk yr) = has cut down to one half PPD.>   Hyperlipidemia associated with type 2 diabetes mellitus ([place]) 08/05/2020   Hypertension    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 = Status post resection December 23,2021 (done at [facility] because of no bed availability at [facility]   Microscopic hematuria    PAF (paroxysmal atrial fibrillation) ([place]) 07/01/2020   Initially noted  postoperatively-left atrial myxoma resection and CABG x1.  Now on amiodarone and apixaban..   Peripheral vascular disease ([place])    Bilateral SFA & ATA occlusion.  2 V runoffvia Peroneal A & PTA. --> s/p L Fem-Pop (AK pop A) bypass (PFTE) 09/12/2020 - pending R Fem-AKPop bypass   Plantar wart of right foot    PONV (postoperative nausea and vomiting)     Medications:  See medication history  Assessment: 70 yof on eliquis PTA to start heparin. Patient is ESRD on dialysis.  Notably patient is found to have a right subconjunctival hemorrhage. CT Chest also notable for AAA without rupture.  Apixaban last dose prior to arrival 7/24 @ 0900. There was a 0400 aPTT that was drawn too early after initial dose change at2328, and resulted at >200. As this was considered an erroneous draw given how soon after dosing change it was, a repeat aPTT was drawn appropriately by HDU RN @ 0800 (8h after initial dose change), and resulted also at >200.   HDU RN noted some oozing from patients arm as well.   Goal of Therapy:  aPTT 66-102 seconds Heparin  level 0.3-0.7 units/ml Monitor platelets by anticoagulation protocol: Yes   Plan:  Discontinued heparin gtt at this time and notified MD [place].  No labs for aPTT/HL to be drawn at this time.   Joetta Manners, PharmD, [facility] Emergency Medicine Clinical Pharmacist ED RPh Phone: [place]: [phone]

## 2021-01-03 NOTE — Progress Notes (Addendum)
[facility] Progress Note  Subjective: seen on HD, in good spirits, no c/o  Vitals:   01/03/21 0930 01/03/21 1000 01/03/21 1030 01/03/21 1054  BP: (!) 100/50 (!) 97/48 (!) 111/56 129/63  Pulse: 71 68 70 74  Resp:    16  Temp:    98.5 F (36.9 C)  TempSrc:    Oral  SpO2:    98%    Exam: General: Chronically ill appearing,nad  Head: Right orbit swelling/erythema. Conjunctival hemorrhage  Neck: Supple. No JVD Lungs: CTA bilaterally without wheezes, rales, or rhonchi. Breathing is unlabored. Heart: RRR with S1 S2 Abdomen: soft non-tender  Ext: 2+ pitting LE edema to thighs. R TMA; L foot with dry gangrene to toes Neuro: A & O  X 3. Moves all extremities spontaneously. Psych:  Responds to questions appropriately with a normal affect. Dialysis Access: R IJ TDC; LUE AVF- significant bruising to arms    OP HD: [facility] MWF   3h 78min  250/500   2/2 bath  59.5kg  TDC + AVF (using intermittently)  Hep none  - mircera 100 q2 last 7/13, due 7/27  - Na thio 25 q HD  Assessment/ Plan: L foot gangrene/recent R TMA for same -- Early osteo on CT, on IV cefepime. VVS consulting.  Intractable nausea/vomiting -per primary ESRD -  HD MWF. HD today off schedule. Next HD Wed. Not on BP lowering meds BP/volume- sig hip edema, 3kg up. ^UF goal w/ next HD.  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  Nutrition - Renal diet with fluid restriction  Afib - on Eliquis, amiodarone, Coreg       Rob Kaily Wragg 01/03/2021, 2:17 PM   Recent Labs  Lab 01/02/21 0500 01/03/21 0414 01/03/21 0415  K 4.7  --  5.5*  BUN 80*  --  86*  CREATININE 8.41*  --  9.20*  CALCIUM 8.2*  --  8.5*  PHOS  --   --  6.8*  HGB 8.3* 8.5*  --    Inpatient medications:  amiodarone  200 mg Oral  QPM   aspirin EC  81 mg Oral QPM   atorvastatin  20 mg Oral QPM   Chlorhexidine Gluconate Cloth  6 each Topical Q0600   heparin sodium (porcine)       ondansetron       sevelamer carbonate  800 mg Oral TID WC   vancomycin variable dose per unstable renal function (pharmacist dosing)   Does not apply See admin instructions    ceFEPime (MAXIPIME) IV Stopped (01/02/21 2019)   HYDROcodone-acetaminophen, ondansetron **OR** ondansetron (ZOFRAN) IV

## 2021-01-03 NOTE — Progress Notes (Signed)
PROGRESS NOTE  Kathryn Hancock IOX:[id number] DOB: Nov 19, 1950 DOA: 01/01/2021 PCP: Gala Lewandowsky, MD  HPI/Recap of past 24 hours: Kathryn Hancock is a 70 y.o. 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 Doren Custard vascular surgeon on December 28, 2020.  Patient has been feeling persistently nauseous unable to take in and was referred to the ER.  In addition patient's sister felt that the patient's left foot has become more swollen and has noticed some discharge.  Patient also developed right-sided periorbital hematoma over the last 1 week.  This was related to the coughing/retching. In the ER x-rays reveal foreign body of the left foot, and labs showed leukocytosis with WBC count of 19,000.  Patient also was hypoglycemic in the ER.  CT head followed by CT maxillofacial and CT abdomen pelvis was done which shows preseptal hematoma of the right orbit and nothing acute in the abdomen.  Patient had been empirically started on antibiotics for the left foot possible infection.  Admitted for further management.  COVID test was negative.     Today, patient still reports some nausea but denies any further vomiting, denies any abdominal pain, chest pain, shortness of breath, fever/chills.  Saw patient during HD, noted bleeding from superficial wounds on bilateral upper extremity.    Assessment/Plan: Principal Problem:   Intractable nausea and vomiting Active Problems:   Left atrial myxoma   AAA (abdominal aortic aneurysm) without rupture (HCC)   ESRD (end stage renal disease) on dialysis (HCC)   PAF (paroxysmal atrial fibrillation) (HCC) -  CHA2DS2-VASc Score 5 (Age, Female, HTN, DM, Vascular)- On Eliquis   Anemia of chronic disease   DM  (diabetes mellitus), type 2 with renal complications (HCC)   S/P CABG (coronary artery bypass graft)    Intractable nausea Etiology unclear, ?Unlikely uremia, possibly infection Currently afebrile, with leukocytosis CT abdomen pelvis unremarkable X-ray abdomen to evaluate for constipation Advance diet as tolerated Supportive management for now  ?Possible cellulitis/early osteomyelitis of left foot Known history of L gangrenous toes Recent R foot transmetatarsal amputation Right thigh wound Sacral decubitus ulcer Currently afebrile, with leukocytosis BC x2 NGTD LA 1.8 CT left foot with possible early osteomyelitis, no organized abscess noted Vascular surgery consulted-no further recommendation, will not pursue any further amputation Continue IV vancomycin, cefepime Monitor closely  ESRD on HD M/W/F Nephrology consulted Daily renal panel  Preseptal hematoma of the right eye Likely 2/2 recurrent retching, reporting blurry vision Currently on AC, will need it as she has history of embolic episodes Monitor closely  Paroxysmal A. Fib Heart rate controlled Continue amiodarone, restart Eliquis on 01/04/21, d/c heparin due to oozing and blood draws Monitor for worsening bleeding Telemetry  Diabetes mellitus type 2 Last A1c 5 SSI, Accu-Cheks, hypoglycemic protocol  Anemia of chronic kidney disease Hemoglobin baseline 9-10 Anemia panel showed iron 28 Daily CBC  Goals of care discussion Failure to thrive Palliative team consulted due to multiple comorbidities, poor prognosis, failure to thrive.  Has been previously evaluated by hospice.       Estimated body mass index is 24.55 kg/m as calculated from the following:   Height as of 12/28/20: 5\' 3"  (1.6 m).   Weight as of 12/28/20: 62.9 kg.     Code Status:  Full  Family Communication: None at bedside (attempted to call daughter on 01/03/2021 with no answer)  Disposition Plan: Status is: Inpatient  The patient will  require care spanning > 2 midnights and should be moved to inpatient because: Inpatient level of care appropriate due to severity of illness  Dispo: The patient is from: SNF              Anticipated d/c is to: SNF              Patient currently is not medically stable to d/c.   Difficult to place patient No     Consultants: Vascular surgery Nephrology  Procedures: None  Antimicrobials: Vancomycin Cefepime  DVT prophylaxis: Eliquis   Objective: Vitals:   01/03/21 0930 01/03/21 1000 01/03/21 1030 01/03/21 1054  BP: (!) 100/50 (!) 97/48 (!) 111/56 129/63  Pulse: 71 68 70 74  Resp:    16  Temp:    98.5 F (36.9 C)  TempSrc:    Oral  SpO2:    98%    Intake/Output Summary (Last 24 hours) at 01/03/2021 1821 Last data filed at 01/03/2021 1700 Gross per 24 hour  Intake 220 ml  Output 1000 ml  Net -780 ml   There were no vitals filed for this visit.  Exam:  General: NAD, R eye hematoma, blurry vision noted Cardiovascular: S1, S2 present Respiratory: CTAB Abdomen: Soft, nontender, nondistended, bowel sounds present Musculoskeletal: R foot with swelling, noted staples. L foot with gangrenous toes with surrounding erythema/swelling Skin: As above, sacral decubitus ulcer, R thigh wound. Noted significant bruising with oozing on BUE Psychiatry: Normal mood    Data Reviewed: CBC: Recent Labs  Lab 01/01/21 1434 01/02/21 0500 01/03/21 0414  WBC 19.4* 13.1* 11.8*  NEUTROABS 17.3*  --  9.9*  HGB 10.6* 8.3* 8.5*  HCT 33.1* 26.1* 26.5*  MCV 99.1 99.6 99.6  PLT 292 201 709   Basic Metabolic Panel: Recent Labs  Lab 01/01/21 1434 01/02/21 0500 01/03/21 0415  NA 130* 128* 131*  K 4.7 4.7 5.5*  CL 87* 88* 91*  CO2 23 19* 19*  GLUCOSE 110* 106* 78  BUN 72* 80* 86*  CREATININE 8.19* 8.41* 9.20*  CALCIUM 9.1 8.2* 8.5*  PHOS  --   --  6.8*   GFR: Estimated Creatinine Clearance: 5.3 mL/min (A) (by C-G formula based on SCr of 9.2 mg/dL (H)). Liver Function  Tests: Recent Labs  Lab 01/01/21 1434 01/02/21 0500 01/03/21 0415  AST 13* 11*  --   ALT 10 9  --   ALKPHOS 117 84  --   BILITOT 0.8 0.9  --   PROT 6.1* 4.5*  --   ALBUMIN 2.5* 1.9* 1.9*   Recent Labs  Lab 01/01/21 1434  LIPASE 29   No results for input(s): AMMONIA in the last 168 hours. Coagulation Profile: Recent Labs  Lab 01/01/21 1842  INR 1.8*   Cardiac Enzymes: No results for input(s): CKTOTAL, CKMB, CKMBINDEX, TROPONINI in the last 168 hours. BNP (last 3 results) No results for input(s): PROBNP in the last 8760 hours. HbA1C: No results for input(s): HGBA1C in the last 72 hours. CBG: Recent Labs  Lab 01/03/21 0507 01/03/21 0611 01/03/21 0832 01/03/21 0934 01/03/21 1057  GLUCAP 73 72 86 88 76   Lipid Profile: No results for input(s): CHOL, HDL, LDLCALC, TRIG, CHOLHDL, LDLDIRECT in the last 72 hours. Thyroid Function Tests: No results for input(s): TSH, T4TOTAL, FREET4, T3FREE, THYROIDAB in the last 72 hours. Anemia Panel:  Recent Labs    01/03/21 0414 01/03/21 0416  VITAMINB12 232  --   FOLATE  --  >100.0  FERRITIN 710*  --   TIBC 113*  --   IRON 28  --    Urine analysis:    Component Value Date/Time   COLORURINE AMBER (A) 09/30/2020 1035   APPEARANCEUR CLOUDY (A) 09/30/2020 1035   LABSPEC 1.012 09/30/2020 1035   PHURINE 8.0 09/30/2020 1035   GLUCOSEU NEGATIVE 09/30/2020 1035   HGBUR NEGATIVE 09/30/2020 1035   BILIRUBINUR NEGATIVE 09/30/2020 1035   KETONESUR NEGATIVE 09/30/2020 1035   PROTEINUR 30 (A) 09/30/2020 1035   NITRITE NEGATIVE 09/30/2020 1035   LEUKOCYTESUR LARGE (A) 09/30/2020 1035   Sepsis Labs: @LABRCNTIP (procalcitonin:4,lacticidven:4)  ) Recent Results (from the past 240 hour(s))  Resp Panel by RT-PCR (Flu A&B, Covid) Nasopharyngeal Swab     Status: None   Collection Time: 01/01/21  7:28 PM   Specimen: Nasopharyngeal Swab; Nasopharyngeal(NP) swabs in vial transport medium  Result Value Ref Range Status   SARS Coronavirus  2 by RT PCR NEGATIVE NEGATIVE Final    Comment: (NOTE) SARS-CoV-2 target nucleic acids are NOT DETECTED.  The SARS-CoV-2 RNA is generally detectable in upper respiratory specimens during the acute phase of infection. The lowest concentration of SARS-CoV-2 viral copies this assay can detect is 138 copies/mL. A negative result does not preclude SARS-Cov-2 infection and should not be used as the sole basis for treatment or other patient management decisions. A negative result may occur with  improper specimen collection/handling, submission of specimen other than nasopharyngeal swab, presence of viral mutation(s) within the areas targeted by this assay, and inadequate number of viral copies(<138 copies/mL). A negative result must be combined with clinical observations, patient history, and epidemiological information. The expected result is Negative.  Fact Sheet for Patients:  EntrepreneurPulse.com.au  Fact Sheet for Healthcare Providers:  IncredibleEmployment.be  This test is no t yet approved or cleared by the [place] FDA and  has been authorized for detection and/or diagnosis of SARS-CoV-2 by FDA under an Emergency Use Authorization (EUA). This EUA will remain  in effect (meaning this test can be used) for the duration of the COVID-19 declaration under Section 564(b)(1) of the Act, 21 U.S.C.section 360bbb-3(b)(1), unless the authorization is terminated  or revoked sooner.       Influenza A by PCR NEGATIVE NEGATIVE Final   Influenza B by PCR NEGATIVE NEGATIVE Final    Comment: (NOTE) The Xpert Xpress SARS-CoV-2/FLU/RSV plus assay is intended as an aid in the diagnosis of influenza from Nasopharyngeal swab specimens and should not be used as a sole basis for treatment. Nasal washings and aspirates are unacceptable for Xpert Xpress SARS-CoV-2/FLU/RSV testing.  Fact Sheet for Patients: EntrepreneurPulse.com.au  Fact  Sheet for Healthcare Providers: IncredibleEmployment.be  This test is not yet approved or cleared by the [place] FDA and has been authorized for detection and/or diagnosis of SARS-CoV-2 by FDA under an Emergency Use Authorization (EUA). This EUA will remain in effect (meaning this test can be used) for the duration of the COVID-19 declaration under Section 564(b)(1) of the Act, 21 U.S.C. section 360bbb-3(b)(1), unless the authorization is terminated or revoked.  Performed at [facility], [place] [address] [place], [place] [address]   Blood culture (routine x 2)     Status: None (Preliminary result)   Collection Time: 01/01/21  8:08 PM   Specimen: BLOOD  Result Value Ref Range Status   Specimen Description BLOOD RIGHT ANTECUBITAL  Final   Special Requests   Final    BOTTLES DRAWN AEROBIC AND ANAEROBIC Blood Culture adequate volume   Culture   Final    NO GROWTH 1 DAY Performed at [facility], [place] [address] [place], [place] [address]    Report Status PENDING  Incomplete      Studies: No results found.  Scheduled Meds:  amiodarone  200 mg Oral QPM   aspirin EC  81 mg Oral QPM   atorvastatin  20 mg Oral QPM   Chlorhexidine Gluconate Cloth  6 each Topical Q0600   heparin sodium (porcine)       ondansetron       sevelamer carbonate  800 mg Oral TID WC    Continuous Infusions:  ceFEPime (MAXIPIME) IV Stopped (01/02/21 2019)   [START ON 01/04/2021] vancomycin       LOS: 1 day     Alma Friendly, MD [facility]  If 7PM-7AM, please contact night-coverage [email or web address] 01/03/2021, 6:21 PM

## 2021-01-03 NOTE — ED Notes (Signed)
Breakfast Order Placed ?

## 2021-01-03 NOTE — Progress Notes (Signed)
Pharmacy Antibiotic Note  Kathryn Hancock is a 70 y.o. female on day #2 Vancomycin and Cefepime for L foot wound infection, possible early osteomyelitis. ESRD, usual MWF HD. Hx R TMA and I&D of R thigh wound on 11/28/20.      Vancomycin 1500 mg IV and Cefepime 2 gm IV loading doses given ~9-10pm on 01/01/21. Cefepime continued with 1gm IV q24h on 7/25.  Off schedule HD today and Vanc 750 mg IV given.  Noted plan for HD on schedule 7/27.  Plan: Vancomycin 750 mg IV after HD on MWF Cefepime 1 gm IV q24 in pm, after HD on HD days. Follow HD schedule for any need to adjust vanc timing. Follow culture data, clinical progress and antibiotic plans.   Temp (24hrs), Avg:98.3 F (36.8 C), Min:98 F (36.7 C), Max:98.5 F (36.9 C)  Recent Labs  Lab 01/01/21 1434 01/02/21 0500 01/03/21 0414 01/03/21 0415  WBC 19.4* 13.1* 11.8*  --   CREATININE 8.19* 8.41*  --  9.20*  LATICACIDVEN 1.8  --   --   --     Estimated Creatinine Clearance: 5.3 mL/min (A) (by C-G formula based on SCr of 9.2 mg/dL (H)).    Allergies  Allergen Reactions   Penicillins Anaphylaxis    Anaphylaxis as a child    Antimicrobials this admission: Vanc 7/24 >> Cefepime 7/24 >>  Dose adjustments this admission: n/a  Microbiology results: 7/24 Blood: no growth x 1 day to date 7/24 COVID and flu: neg  Thank you for allowing pharmacy to be a part of this patient's care.  Arty Baumgartner, [place] 01/03/2021 3:56 PM

## 2021-01-03 NOTE — Progress Notes (Signed)
Dialysis Nurse Note  Attempted to contact Dr Horris Latino via Epic messaging regarding pt's Heparin gtt and PTT results, was not able to receive a response.  This RN turned off heparin gtt at 1000, noted oozing from some one of her skin abrasions (R lower arm) Also noted that PTT at 0415 was >200.   Pharmacy called at approx 1020 re 2nd PTT that was drawn which still resulted as >200 and confirmed that heparin gtt should be turned off.   Dr. Horris Latino stopped by to see pt on HD. This RN reported these concerns to her; she is aware that heparin gtt is off.

## 2021-01-03 NOTE — ED Notes (Signed)
Critical lab value called to primary RN and hemodialysis    PTT > 200

## 2021-01-03 NOTE — Plan of Care (Signed)

## 2021-01-04 DIAGNOSIS — L03119 Cellulitis of unspecified part of limb: Secondary | ICD-10-CM

## 2021-01-04 DIAGNOSIS — R112 Nausea with vomiting, unspecified: Secondary | ICD-10-CM | POA: Diagnosis not present

## 2021-01-04 DIAGNOSIS — N186 End stage renal disease: Secondary | ICD-10-CM | POA: Diagnosis not present

## 2021-01-04 LAB — CBC WITH DIFFERENTIAL/PLATELET
Abs Immature Granulocytes: 0.1 10*3/uL — ABNORMAL HIGH (ref 0.00–0.07)
Basophils Absolute: 0 10*3/uL (ref 0.0–0.1)
Basophils Relative: 0 %
Eosinophils Absolute: 0.1 10*3/uL (ref 0.0–0.5)
Eosinophils Relative: 1 %
HCT: 26.4 % — ABNORMAL LOW (ref 36.0–46.0)
Hemoglobin: 8.3 g/dL — ABNORMAL LOW (ref 12.0–15.0)
Immature Granulocytes: 1 %
Lymphocytes Relative: 8 %
Lymphs Abs: 0.8 10*3/uL (ref 0.7–4.0)
MCH: 31.3 pg (ref 26.0–34.0)
MCHC: 31.4 g/dL (ref 30.0–36.0)
MCV: 99.6 fL (ref 80.0–100.0)
Monocytes Absolute: 0.7 10*3/uL (ref 0.1–1.0)
Monocytes Relative: 7 %
Neutro Abs: 8.2 10*3/uL — ABNORMAL HIGH (ref 1.7–7.7)
Neutrophils Relative %: 83 %
Platelets: 146 10*3/uL — ABNORMAL LOW (ref 150–400)
RBC: 2.65 MIL/uL — ABNORMAL LOW (ref 3.87–5.11)
RDW: 17.4 % — ABNORMAL HIGH (ref 11.5–15.5)
WBC: 9.9 10*3/uL (ref 4.0–10.5)
nRBC: 0 % (ref 0.0–0.2)

## 2021-01-04 LAB — RENAL FUNCTION PANEL
Albumin: 1.8 g/dL — ABNORMAL LOW (ref 3.5–5.0)
Anion gap: 15 (ref 5–15)
BUN: 35 mg/dL — ABNORMAL HIGH (ref 8–23)
CO2: 23 mmol/L (ref 22–32)
Calcium: 7.9 mg/dL — ABNORMAL LOW (ref 8.9–10.3)
Chloride: 97 mmol/L — ABNORMAL LOW (ref 98–111)
Creatinine, Ser: 5.41 mg/dL — ABNORMAL HIGH (ref 0.44–1.00)
GFR, Estimated: 8 mL/min — ABNORMAL LOW
Glucose, Bld: 115 mg/dL — ABNORMAL HIGH (ref 70–99)
Phosphorus: 4.7 mg/dL — ABNORMAL HIGH (ref 2.5–4.6)
Potassium: 3.7 mmol/L (ref 3.5–5.1)
Sodium: 135 mmol/L (ref 135–145)

## 2021-01-04 LAB — GLUCOSE, CAPILLARY
Glucose-Capillary: 117 mg/dL — ABNORMAL HIGH (ref 70–99)
Glucose-Capillary: 77 mg/dL (ref 70–99)
Glucose-Capillary: 161 mg/dL — ABNORMAL HIGH (ref 70–99)

## 2021-01-04 MED ORDER — DARBEPOETIN ALFA 60 MCG/0.3ML IJ SOSY
60.0000 ug | PREFILLED_SYRINGE | INTRAMUSCULAR | Status: DC
  Administered 2021-01-04: 60 ug via INTRAVENOUS

## 2021-01-04 MED ORDER — VANCOMYCIN HCL 750 MG/150ML IV SOLN
INTRAVENOUS | Status: AC
  Administered 2021-01-04: 750 mg
  Filled 2021-01-04: qty 150

## 2021-01-04 MED ORDER — OXYCODONE HCL 5 MG PO TABS
5.0000 mg | ORAL_TABLET | Freq: Four times a day (QID) | ORAL | Status: DC | PRN
  Administered 2021-01-04 – 2021-01-09 (×13): 5 mg via ORAL
  Filled 2021-01-04 (×13): qty 1

## 2021-01-04 MED ORDER — METOPROLOL TARTRATE 5 MG/5ML IV SOLN: 2.5000 mg | INTRAVENOUS | Status: DC | PRN

## 2021-01-04 MED ORDER — HEPARIN SODIUM (PORCINE) 1000 UNIT/ML IJ SOLN
INTRAMUSCULAR | Status: AC
  Filled 2021-01-04: qty 4

## 2021-01-04 MED ORDER — DILTIAZEM HCL 25 MG/5ML IV SOLN
10.0000 mg | Freq: Once | INTRAVENOUS | Status: AC
  Administered 2021-01-04: 10 mg via INTRAVENOUS
  Filled 2021-01-04: qty 5

## 2021-01-04 MED ORDER — DARBEPOETIN ALFA 60 MCG/0.3ML IJ SOSY
PREFILLED_SYRINGE | INTRAMUSCULAR | Status: AC
  Filled 2021-01-04: qty 0.3

## 2021-01-04 MED ORDER — OXYCODONE HCL 5 MG PO TABS
ORAL_TABLET | ORAL | Status: AC
  Administered 2021-01-04: 5 mg via ORAL
  Filled 2021-01-04: qty 1

## 2021-01-04 NOTE — Consult Note (Signed)
[place] Nurse Consult Note: Patient receiving care in [phone] Reason for Consult:Multiple wounds This patient is managed by Dr. Scot Dock with [facility]. Wound care orders have been placed. WOC will sign off at this time but are still available to the patient if needed.   Monitor the wound area(s) for worsening of condition such as: Signs/symptoms of infection, increase in size, development of or worsening of odor, development of pain, or increased pain at the affected locations.   Notify the medical team if any of these develop.  Thank you for the consult. [place] nurse will not follow at this time.   Please re-consult the [place] team if needed.  Cathlean Marseilles Tamala Julian, MSN, RN, [place], [place], [address] [facility] Wound Treatment Associate Pager [phone]

## 2021-01-04 NOTE — Progress Notes (Signed)
Kathryn Hancock Progress Note  Subjective: seen on HD, in good spirits, no c/o  Vitals:   01/04/21 0938 01/04/21 1055 01/04/21 1103 01/04/21 1130  BP: (!) 151/72 (!) 159/68 (!) 162/67 (!) 124/59  Pulse: 71 69 68 67  Resp: 18 14 13 12   Temp: 98.2 F (36.8 C) 98 F (36.7 C)    TempSrc:  Oral    SpO2: 100% 100% 100%   Weight:        Exam: General: Chronically ill appearing,nad  Head: Right orbit swelling/erythema. Conjunctival hemorrhage  Neck: Supple. No JVD Lungs: CTA bilaterally without wheezes, rales, or rhonchi. Breathing is unlabored. Heart: RRR with S1 S2 Abdomen: soft non-tender  Ext: 2+ pitting LE edema to thighs. R TMA; L foot with dry gangrene to toes Neuro: A & O  X 3. Moves all extremities spontaneously. Psych:  Responds to questions appropriately with a normal affect. Dialysis Access: R IJ TDC; LUE AVF- significant bruising to arms    OP HD: [facility] MWF   3h 65min  250/500   2/2 bath  59.5kg  TDC + AVF (using intermittently)  Hep none  - mircera 100 q2 last 7/13, due 7/27  - Na thio 25 q HD  Assessment/ Plan: L foot gangrene/recent R TMA for same -- Early osteo on CT, on IV cefepime. [facility] consulting.  ESRD -  HD MWF. Had HD here on off schedule yesterday, HD again today.  BP/volume- sig hip edema, 3kg up. 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 June admit , started on Na thio tiw w/ HD, no Ca/ vit D products/ low Ca bath  Anemia  - Hgb 8.3 Resume ESA on 0/30  Metabolic bone disease -  No Ca/Vit D 2/2 suspected calciphylaxis  Nutrition - Renal diet with fluid restriction  Afib - on Eliquis, amiodarone, Coreg       Kathryn Hancock 01/04/2021, 12:05 PM   Recent Labs  Lab 01/03/21 0414 01/03/21 0415 01/04/21 0302  K  --  5.5* 3.7  BUN  --  86* 35*  CREATININE  --  9.20* 5.41*  CALCIUM  --  8.5* 7.9*  PHOS  --  6.8* 4.7*  HGB 8.5*  --  8.3*    Inpatient medications:  amiodarone  200  mg Oral QPM   apixaban  5 mg Oral BID   aspirin EC  81 mg Oral QPM   atorvastatin  20 mg Oral QPM   Chlorhexidine Gluconate Cloth  6 each Topical Q0600   sevelamer carbonate  800 mg Oral TID WC    ceFEPime (MAXIPIME) IV 1 g (01/03/21 2015)   vancomycin     HYDROcodone-acetaminophen, ondansetron **OR** ondansetron (ZOFRAN) IV, oxyCODONE

## 2021-01-04 NOTE — Progress Notes (Signed)
Palliative-   Consult received- chart reviewed. Patient currently in dialysis.  Called patient's daughter- arranged for [place] discussion tomorrow morning at [place], AGNP-C Palliative Medicine  Please call Palliative Medicine team phone with any questions [phone]. For individual providers please see AMION.  No charge

## 2021-01-04 NOTE — Consult Note (Addendum)
[place] Nurse Consult Note: Patient receiving care in [facility] [phone] Reason for Consult: Sacral wounds Wound type: Multiple unstageable wounds on the left and right trochanter and DTI on the coccyx.  Pressure Injury POA: Yes Measurement:  Right trochanter is 100% eschar with purulent drainage and odor, measures 15 cm x 6 cm Left proximal trochanter wound is 100% eschar and measures 7 cm x 2.2 cm. Left distal trochanter wound is 90% eschar with 10% yellow slough surrounding the outer edge, purulent drainage and odor, measuring 6 cm x 4 cm.  Two small unstageable wounds just below the proximal trochanter wound that are 100% escar with no drainage Coccyx is a DTPI  Dressing procedure/placement/frequency: Surgical consult for this patient is appreciated. Foam dressings are in place for now until Surgery has evaluated this patient for surgical debridement of these wounds.     Thank you for the consult. [place] nurse will not follow at this time but are available to this patient if needed.  Please re-consult the [place] team if needed.  Cathlean Marseilles Tamala Julian, MSN, RN, [place], [place], [facility] Wound Treatment Associate Pager [phone]

## 2021-01-04 NOTE — Progress Notes (Signed)
PROGRESS NOTE  Kathryn Hancock WFU:[id number] DOB: April 03, 1951 DOA: 01/01/2021 PCP: Gala Lewandowsky, MD  HPI/ Kathryn Hancock is a 70 y.o. 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 Doren Custard vascular surgeon on December 28, 2020.  Patient has been feeling persistently nauseous unable to take in and was referred to the ER.  In addition patient's sister felt that the patient's left foot has become more swollen and has noticed some discharge.  Patient also developed right-sided periorbital hematoma over the last 1 week.  This was related to the coughing/retching. In the ER x-rays reveal foreign body of the left foot, and labs showed leukocytosis with WBC count of 19,000.  Patient also was hypoglycemic in the ER.  CT head followed by CT maxillofacial and CT abdomen pelvis was done which shows preseptal hematoma of the right orbit and nothing acute in the abdomen.  Patient had been empirically started on antibiotics for the left foot possible infection.  Admitted for further management.  COVID test was negative.    Subjective  Denies any chest pain, reports generalized weakness, fatigue and poor appetite, she reports some nausea, but oral intake has improved, she denies any worsening of her vision.   Assessment/Plan: Principal Problem:   Intractable nausea and vomiting Active Problems:   Left atrial myxoma   AAA (abdominal aortic aneurysm) without rupture (HCC)   ESRD (end stage renal disease) on dialysis (HCC)   PAF (paroxysmal atrial fibrillation) (HCC) -  CHA2DS2-VASc Score 5 (Age, Female, HTN, DM, Vascular)- On Eliquis   Anemia of chronic disease   DM (diabetes mellitus), type 2 with renal complications (HCC)   S/P CABG (coronary  artery bypass graft)    Intractable nausea Etiology unclear, ?Unlikely uremia, possibly infection Currently afebrile, with leukocytosis CT abdomen pelvis unremarkable X-ray abdomen to evaluate for constipation Advance diet as tolerated Supportive management for now  Possible cellulitis/early osteomyelitis of left foot/ Known history of L gangrenous toes/ Recent R foot transmetatarsal amputation -she is currently afebrile, leukocytosis has resolved -Follow-up blood cultures, so far no growth to date CT left foot with possible early osteomyelitis, no organized abscess noted Vascular surgery consulted-no further recommendation, will not pursue any further amputation Continue IV vancomycin, cefepime Monitor closely  ESRD on HD M/W/F Nephrology consulted, she was dialyzed this morning  Preseptal hematoma of the right eye Likely 2/2 recurrent retching, Currently on AC, will need it as she has history of embolic episodes Denies any worsening vision today  Calciphylaxis with significant wounds, right thigh wound Sacral decubitus ulcer -Wound care consulted.  Paroxysmal A. Fib Heart rate controlled Continue amiodarone, restart Eliquis on 01/04/21, d/c heparin due to oozing and blood draws Monitor for worsening bleeding Telemetry  Diabetes mellitus type 2 Last A1c 5 SSI, Accu-Cheks, hypoglycemic protocol  Anemia of chronic kidney disease Hemoglobin baseline 9-10 Anemia panel showed iron 28 Daily CBC  Goals of care discussion/Failure to thrive Palliative team consulted due to multiple comorbidities, poor prognosis, extremely poor life quality, with amputations, gangrene, calciphylaxis, pressure ulcers, dialysis dependent, persistent nausea, very poor appetite, negative plan to have meeting with daughter tomorrow at 10 AM regarding goals of care discussion.        Estimated body mass index is 24.53 kg/m as  calculated from the following:   Height as of 12/28/20: 5\' 3"  (1.6  m).   Weight as of this encounter: 62.8 kg.     Code Status: Full  Family Communication: None at bedside   Disposition Plan: Status is: Inpatient  The patient will require care spanning > 2 midnights and should be moved to inpatient because: Inpatient level of care appropriate due to severity of illness  Dispo: The patient is from: SNF              Anticipated d/c is to: SNF              Patient currently is not medically stable to d/c.   Difficult to place patient No     Consultants: Vascular surgery Nephrology Palliaitve  Procedures: None  Antimicrobials: Vancomycin Cefepime  DVT prophylaxis: Eliquis   Objective: Vitals:   01/04/21 1230 01/04/21 1300 01/04/21 1330 01/04/21 1535  BP: (!) 111/52 (!) 90/50 (!) 111/43 (!) 137/53  Pulse: 71 72 77 70  Resp: 14 13 13 18   Temp:    98.3 F (36.8 C)  TempSrc:    Oral  SpO2:    100%  Weight:        Intake/Output Summary (Last 24 hours) at 01/04/2021 1553 Last data filed at 01/04/2021 1300 Gross per 24 hour  Intake 640 ml  Output 0 ml  Net 640 ml   Filed Weights   01/03/21 2049  Weight: 62.8 kg    Exam:  General: Frail, deconditioned, sitting at the edge of the bed sitting up Cardiovascular: S1, S2 present, no rubs or gallop Respiratory: Lungs clear to auscultation Abdomen: Abdomen soft, bowel sounds present Musculoskeletal: Right leg with swelling, staples, left foot with dry gangrene with surrounding erythema/swelling  Skin: As above, sacral decubitus ulcer, R thigh wound. Noted significant bruising with oozing on BUE patient with right back bruise from fall, with multiple significant bruising and ecchymosis in bilateral upper extremities Psychiatry: Normal mood    Data Reviewed: CBC: Recent Labs  Lab 01/01/21 1434 01/02/21 0500 01/03/21 0414 01/04/21 0302  WBC 19.4* 13.1* 11.8* 9.9  NEUTROABS 17.3*  --  9.9* 8.2*  HGB 10.6* 8.3* 8.5* 8.3*  HCT 33.1* 26.1* 26.5* 26.4*  MCV 99.1 99.6 99.6 99.6   PLT 292 201 179 425*   Basic Metabolic Panel: Recent Labs  Lab 01/01/21 1434 01/02/21 0500 01/03/21 0415 01/04/21 0302  NA 130* 128* 131* 135  K 4.7 4.7 5.5* 3.7  CL 87* 88* 91* 97*  CO2 23 19* 19* 23  GLUCOSE 110* 106* 78 115*  BUN 72* 80* 86* 35*  CREATININE 8.19* 8.41* 9.20* 5.41*  CALCIUM 9.1 8.2* 8.5* 7.9*  PHOS  --   --  6.8* 4.7*   GFR: Estimated Creatinine Clearance: 8.3 mL/min (A) (by C-G formula based on SCr of 5.41 mg/dL (H)). Liver Function Tests: Recent Labs  Lab 01/01/21 1434 01/02/21 0500 01/03/21 0415 01/04/21 0302  AST 13* 11*  --   --   ALT 10 9  --   --   ALKPHOS 117 84  --   --   BILITOT 0.8 0.9  --   --   PROT 6.1* 4.5*  --   --   ALBUMIN 2.5* 1.9* 1.9* 1.8*   Recent Labs  Lab 01/01/21 1434  LIPASE 29   No results for input(s): AMMONIA in the last 168 hours. Coagulation Profile: Recent Labs  Lab 01/01/21 1842  INR 1.8*   Cardiac Enzymes: No  results for input(s): CKTOTAL, CKMB, CKMBINDEX, TROPONINI in the last 168 hours. BNP (last 3 results) No results for input(s): PROBNP in the last 8760 hours. HbA1C: No results for input(s): HGBA1C in the last 72 hours. CBG: Recent Labs  Lab 01/03/21 0832 01/03/21 0934 01/03/21 1057 01/03/21 2050 01/04/21 1538  GLUCAP 86 88 76 137* 77   Lipid Profile: No results for input(s): CHOL, HDL, LDLCALC, TRIG, CHOLHDL, LDLDIRECT in the last 72 hours. Thyroid Function Tests: No results for input(s): TSH, T4TOTAL, FREET4, T3FREE, THYROIDAB in the last 72 hours. Anemia Panel: Recent Labs    01/03/21 0414 01/03/21 0416  VITAMINB12 232  --   FOLATE  --  >100.0  FERRITIN 710*  --   TIBC 113*  --   IRON 28  --    Urine analysis:    Component Value Date/Time   COLORURINE AMBER (A) 09/30/2020 1035   APPEARANCEUR CLOUDY (A) 09/30/2020 1035   LABSPEC 1.012 09/30/2020 1035   PHURINE 8.0 09/30/2020 1035   GLUCOSEU NEGATIVE 09/30/2020 1035   HGBUR NEGATIVE 09/30/2020 1035   BILIRUBINUR NEGATIVE  09/30/2020 1035   KETONESUR NEGATIVE 09/30/2020 1035   PROTEINUR 30 (A) 09/30/2020 1035   NITRITE NEGATIVE 09/30/2020 1035   LEUKOCYTESUR LARGE (A) 09/30/2020 1035   Sepsis Labs: @LABRCNTIP (procalcitonin:4,lacticidven:4)  ) Recent Results (from the past 240 hour(s))  Resp Panel by RT-PCR (Flu A&B, Covid) Nasopharyngeal Swab     Status: None   Collection Time: 01/01/21  7:28 PM   Specimen: Nasopharyngeal Swab; Nasopharyngeal(NP) swabs in vial transport medium  Result Value Ref Range Status   SARS Coronavirus 2 by RT PCR NEGATIVE NEGATIVE Final    Comment: (NOTE) SARS-CoV-2 target nucleic acids are NOT DETECTED.  The SARS-CoV-2 RNA is generally detectable in upper respiratory specimens during the acute phase of infection. The lowest concentration of SARS-CoV-2 viral copies this assay can detect is 138 copies/mL. A negative result does not preclude SARS-Cov-2 infection and should not be used as the sole basis for treatment or other patient management decisions. A negative result may occur with  improper specimen collection/handling, submission of specimen other than nasopharyngeal swab, presence of viral mutation(s) within the areas targeted by this assay, and inadequate number of viral copies(<138 copies/mL). A negative result must be combined with clinical observations, patient history, and epidemiological information. The expected result is Negative.  Fact Sheet for Patients:  EntrepreneurPulse.com.au  Fact Sheet for Healthcare Providers:  IncredibleEmployment.be  This test is no t yet approved or cleared by the [place] FDA and  has been authorized for detection and/or diagnosis of SARS-CoV-2 by FDA under an Emergency Use Authorization (EUA). This EUA will remain  in effect (meaning this test can be used) for the duration of the COVID-19 declaration under Section 564(b)(1) of the Act, 21 U.S.C.section 360bbb-3(b)(1), unless the  authorization is terminated  or revoked sooner.       Influenza A by PCR NEGATIVE NEGATIVE Final   Influenza B by PCR NEGATIVE NEGATIVE Final    Comment: (NOTE) The Xpert Xpress SARS-CoV-2/FLU/RSV plus assay is intended as an aid in the diagnosis of influenza from Nasopharyngeal swab specimens and should not be used as a sole basis for treatment. Nasal washings and aspirates are unacceptable for Xpert Xpress SARS-CoV-2/FLU/RSV testing.  Fact Sheet for Patients: EntrepreneurPulse.com.au  Fact Sheet for Healthcare Providers: IncredibleEmployment.be  This test is not yet approved or cleared by the [place] FDA and has been authorized for detection and/or diagnosis of SARS-CoV-2 by FDA  under an Emergency Use Authorization (EUA). This EUA will remain in effect (meaning this test can be used) for the duration of the COVID-19 declaration under Section 564(b)(1) of the Act, 21 U.S.C. section 360bbb-3(b)(1), unless the authorization is terminated or revoked.  Performed at [facility], [place] [address] [place], [place] [address]   Blood culture (routine x 2)     Status: None (Preliminary result)   Collection Time: 01/01/21  8:08 PM   Specimen: BLOOD  Result Value Ref Range Status   Specimen Description BLOOD RIGHT ANTECUBITAL  Final   Special Requests   Final    BOTTLES DRAWN AEROBIC AND ANAEROBIC Blood Culture adequate volume   Culture   Final    NO GROWTH 2 DAYS Performed at [facility], [place] [address], [place], [place] [address]    Report Status PENDING  Incomplete      Studies: DG Abd Portable 1V  Result Date: 01/04/2021 CLINICAL DATA:  Constipation. EXAM: PORTABLE ABDOMEN - 1 VIEW COMPARISON:  CT 06/26/2019. FINDINGS: Gallstones again noted. Radiopacity noted over the left upper abdomen possibly pill debris within the stomach vascular calcification. No bowel distention. Moderate amount of stool noted throughout the  colon. No free air. Hemidiaphragms incompletely imaged. Degenerative changes lumbar spine and both hips. IMPRESSION: 1.  Gallstones again noted. 2. Moderate amount of stool noted throughout the colon. No bowel distention. No acute abnormality identified. Electronically Signed   By: Marcello Moores  Register   On: 01/04/2021 07:36    Scheduled Meds:  amiodarone  200 mg Oral QPM   apixaban  5 mg Oral BID   aspirin EC  81 mg Oral QPM   atorvastatin  20 mg Oral QPM   Chlorhexidine Gluconate Cloth  6 each Topical Q0600   darbepoetin (ARANESP) injection - DIALYSIS  60 mcg Intravenous Q Wed-HD   sevelamer carbonate  800 mg Oral TID WC    Continuous Infusions:  ceFEPime (MAXIPIME) IV 1 g (01/03/21 2015)   vancomycin       LOS: 2 days     Phillips Climes, MD [facility]  If 7PM-7AM, please contact night-coverage [email or web address] 01/04/2021, 3:53 PM

## 2021-01-04 NOTE — Progress Notes (Addendum)
   01/04/21 1617  Notify: Provider  Date Provider Notified 01/04/21  Time Provider Notified 1555  Notification Type Page  Notification Reason Other (Comment)  Provider response Other (Comment) (converted back to afib)   Continue to monitor pt

## 2021-01-05 DIAGNOSIS — T148XXA Other injury of unspecified body region, initial encounter: Secondary | ICD-10-CM | POA: Diagnosis not present

## 2021-01-05 DIAGNOSIS — I96 Gangrene, not elsewhere classified: Secondary | ICD-10-CM

## 2021-01-05 DIAGNOSIS — N186 End stage renal disease: Secondary | ICD-10-CM | POA: Diagnosis not present

## 2021-01-05 DIAGNOSIS — D638 Anemia in other chronic diseases classified elsewhere: Secondary | ICD-10-CM | POA: Diagnosis not present

## 2021-01-05 DIAGNOSIS — Z7189 Other specified counseling: Secondary | ICD-10-CM | POA: Diagnosis not present

## 2021-01-05 DIAGNOSIS — R112 Nausea with vomiting, unspecified: Secondary | ICD-10-CM | POA: Diagnosis not present

## 2021-01-05 LAB — RENAL FUNCTION PANEL
Albumin: 1.8 g/dL — ABNORMAL LOW (ref 3.5–5.0)
Anion gap: 14 (ref 5–15)
BUN: 16 mg/dL (ref 8–23)
CO2: 22 mmol/L (ref 22–32)
Calcium: 8 mg/dL — ABNORMAL LOW (ref 8.9–10.3)
Chloride: 98 mmol/L (ref 98–111)
Creatinine, Ser: 3.5 mg/dL — ABNORMAL HIGH (ref 0.44–1.00)
GFR, Estimated: 14 mL/min — ABNORMAL LOW (ref >60)
Glucose, Bld: 98 mg/dL (ref 70–99)
Phosphorus: 4.1 mg/dL (ref 2.5–4.6)
Potassium: 3.4 mmol/L — ABNORMAL LOW (ref 3.5–5.1)
Sodium: 134 mmol/L — ABNORMAL LOW (ref 135–145)

## 2021-01-05 LAB — CBC WITH DIFFERENTIAL/PLATELET
Abs Immature Granulocytes: 0.08 10*3/uL — ABNORMAL HIGH (ref 0.00–0.07)
Basophils Absolute: 0 10*3/uL (ref 0.0–0.1)
Basophils Relative: 0 %
Eosinophils Absolute: 0.1 10*3/uL (ref 0.0–0.5)
Eosinophils Relative: 1 %
HCT: 24.3 % — ABNORMAL LOW (ref 36.0–46.0)
Hemoglobin: 7.4 g/dL — ABNORMAL LOW (ref 12.0–15.0)
Immature Granulocytes: 1 %
Lymphocytes Relative: 8 %
Lymphs Abs: 0.7 10*3/uL (ref 0.7–4.0)
MCH: 31 pg (ref 26.0–34.0)
MCHC: 30.5 g/dL (ref 30.0–36.0)
MCV: 101.7 fL — ABNORMAL HIGH (ref 80.0–100.0)
Monocytes Absolute: 0.8 10*3/uL (ref 0.1–1.0)
Monocytes Relative: 9 %
Neutro Abs: 7.2 10*3/uL (ref 1.7–7.7)
Neutrophils Relative %: 81 %
Platelets: 127 10*3/uL — ABNORMAL LOW (ref 150–400)
RBC: 2.39 MIL/uL — ABNORMAL LOW (ref 3.87–5.11)
RDW: 17.2 % — ABNORMAL HIGH (ref 11.5–15.5)
WBC: 8.9 10*3/uL (ref 4.0–10.5)
nRBC: 0 % (ref 0.0–0.2)

## 2021-01-05 LAB — GLUCOSE, CAPILLARY
Glucose-Capillary: 129 mg/dL — ABNORMAL HIGH (ref 70–99)
Glucose-Capillary: 132 mg/dL — ABNORMAL HIGH (ref 70–99)
Glucose-Capillary: 139 mg/dL — ABNORMAL HIGH (ref 70–99)
Glucose-Capillary: 155 mg/dL — ABNORMAL HIGH (ref 70–99)
Glucose-Capillary: 79 mg/dL (ref 70–99)
Glucose-Capillary: 97 mg/dL (ref 70–99)

## 2021-01-05 LAB — PREPARE RBC (CROSSMATCH)

## 2021-01-05 MED ORDER — DARBEPOETIN ALFA 100 MCG/0.5ML IJ SOSY
100.0000 ug | PREFILLED_SYRINGE | INTRAMUSCULAR | Status: DC
  Administered 2021-01-06: 100 ug via INTRAVENOUS

## 2021-01-05 MED ORDER — COLLAGENASE 250 UNIT/GM EX OINT
TOPICAL_OINTMENT | Freq: Every day | CUTANEOUS | Status: DC
  Filled 2021-01-05 (×2): qty 30

## 2021-01-05 MED ORDER — GABAPENTIN 100 MG PO CAPS
100.0000 mg | ORAL_CAPSULE | Freq: Every day | ORAL | Status: DC
  Administered 2021-01-05 – 2021-01-09 (×5): 100 mg via ORAL
  Filled 2021-01-05 (×5): qty 1

## 2021-01-05 MED ORDER — PROCHLORPERAZINE MALEATE 5 MG PO TABS
5.0000 mg | ORAL_TABLET | Freq: Four times a day (QID) | ORAL | Status: DC | PRN
  Administered 2021-01-05 – 2021-01-09 (×4): 5 mg via ORAL
  Filled 2021-01-05 (×5): qty 1

## 2021-01-05 MED ORDER — ARTIFICIAL TEARS OPHTHALMIC OINT
1.0000 "application " | TOPICAL_OINTMENT | OPHTHALMIC | Status: DC
  Administered 2021-01-05 – 2021-01-09 (×17): 1 via OPHTHALMIC
  Filled 2021-01-05: qty 3.5

## 2021-01-05 MED ORDER — PROCHLORPERAZINE EDISYLATE 10 MG/2ML IJ SOLN
10.0000 mg | Freq: Once | INTRAMUSCULAR | Status: AC
  Administered 2021-01-05: 10 mg via INTRAVENOUS
  Filled 2021-01-05: qty 2

## 2021-01-05 MED ORDER — SODIUM CHLORIDE 0.9% IV SOLUTION: Freq: Once | INTRAVENOUS | Status: AC

## 2021-01-05 NOTE — Consult Note (Addendum)
Consultation Note Date: 01/05/2021   Patient Name: Kathryn Hancock  DOB: 06-03-1951  MRN: [id number]  Age / Sex: 70 y.o., female  PCP: Gala Lewandowsky, MD Referring Physician: Albertine Patricia, MD  Reason for Consultation: Establishing goals of care  HPI/Patient Profile: 70 y.o. 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 Workup reveals possible myelitis in L foot. Palliative medicine consulted for goals of care discussion.    Clinical Assessment and Goals of Care: Chart reviewed- patient has been seen by Palliative Medicine Team (PMT) on previous admissions at which time she expressed desire for full scope and full code status. MOST was attempted to be completed during those admissions- however, patient and family declined at those times.  On my exam Kathryn Hancock is awake, alert, and oriented.  She feels she was doing well at her rehab facility- was able to transfer herself from bed to wheelchair, tolerating dialysis well.  When asked about her pain she replied there's nothing we can do for her- the pain medicine makes her nauseated and then she vomits and the process of vomiting makes her pain worse. Noteably, she has a R eye hematoma that she attributes to vomiting.  Her daughter arrived during the consult and Kathryn Hancock began to cry and stated that she felt that providers were trying to "talk her into dying". She and daughter expressed that in the past in their discussion with Palliative providers they had been scared because she was told she only had 3-66mths left to live. I provided emotional support- validated how that would be a frightening conversation. Kathryn Hancock was concerned when  she heard that Palliative was coming again today because she feared that the goal of the discussion was to talk her into dying.  I assured patient that I was not there to talk her into dying. I defined Palliative medicine for her as follows:  Palliative medicine is specialized medical care for people living with serious illness. It focuses on providing relief from the symptoms and stress of a serious illness. The goal is to improve quality of life for both the patient and the family. I shared that often we do also talk about what the patient's goals for their medical care are and are involved in advanced care planning decisions as well.  Analyah states that she has a goal to live long enough to pay her home off for her daughter and grandchildren. Caring for them has been very important to her and she worries how they will fare after she dies. Her anxiety about this was alleviated a bit with her daughter was able to share her plan for how to pay for the house should she die before it is paid off.  We discussed symptom management- she would likely benefit from addition of an adjuvant medication- she and daughter  agree to trial low dose of gabapentin at night. We also discussed taking compazine and pain medicine at the same time to help prevent nausea and allow for improved po intake. She feels the compazine she received this morning allowed her to enjoy her breakfast.   Primary Decision Maker PATIENT    SUMMARY OF RECOMMENDATIONS -Chronic pain r/t wounds- add adjuvant- neurontin 100mg  QHS- recommend titrating up to a max of 300mg  /day (dialysis dosing)- note it is 100 percent dialyzed so needs to be given after dialysis -Nausea- trial 5mg  compazine po q6hr prn- give when patient takes her oxycodone as nausea prophylaxis -Full scope, full code -Code status was not discussed at this time due to patient's and daughter's distress at the thought of "being talked into dying"- this was recently discussed on a  recent admission by PMT -GOC continue to be full scope, life prolonging care with as good quality as possible    Code Status/Advance Care Planning: Full code  Prognosis:   Unable to determine  Discharge Planning: [place] for rehab with Palliative care service follow-up  Primary Diagnoses: Present on Admission:  Left atrial myxoma  AAA (abdominal aortic aneurysm) without rupture (HCC)  PAF (paroxysmal atrial fibrillation) (HCC) -  CHA2DS2-VASc Score 5 (Age, Female, HTN, DM, Vascular)- On Eliquis  Anemia of chronic disease  DM (diabetes mellitus), type 2 with renal complications ([place])   I have reviewed the medical record, interviewed the patient and family, and examined the patient. The following aspects are pertinent.  Past Medical History:  Diagnosis Date   AAA (abdominal aortic aneurysm) ([place]) 06/2019   Multiple small pseudoaneurysmal projections of the dominant aorta.  Distal abdominal aortic aneurysm 4.5 x 4.7 cm.  Greatest AP dimension of the infrarenal aorta is 4.9.  No evidence of thoracic aortic aneurysm or dissection.  Brief segment of proximal IMA occlusion.   Anemia    Coronary artery disease involving native coronary artery of native heart without angina pectoris 06/01/2020   Cardiac cath at [facility] 06/01/2020-preop for atrial myxoma resection -> severe proximal RCA and PDA -> had single-vessel CABG with SVG-PDA along with myxoma resection.   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 05/27/2020   ESRD (end stage renal disease) on dialysis ([place]) 06/2020   Progression of CKDIIIb to ESRD initially related to thromboembolic event from left atrial myxoma; complicated by perioperative hypotension-; now 1 on HD TU/TH/SAT @ [facility]   GERD (gastroesophageal reflux disease)    Heavy smoker (more than 20 cigarettes per day)    ~ 2 ppd; since  age 22 (90 pk yr) = has cut down to one half PPD.>   Hyperlipidemia associated with type 2 diabetes mellitus ([place]) 08/05/2020   Hypertension    LEFTATRIAL MYXOMA 06/2019   Large residual myxoma-complicated by thrombolic events with progression of renal failure and PAD. = Status post resection December 23,2021 (done at [facility] because of no bed availability at [facility]   Microscopic hematuria    PAF (paroxysmal atrial fibrillation) ([place]) 07/01/2020   Initially noted postoperatively-left atrial myxoma resection and CABG x1.  Now on amiodarone and apixaban..   Peripheral vascular disease ([place])    Bilateral SFA & ATA occlusion.  2 V runoffvia Peroneal A & PTA. --> s/p L Fem-Pop (AK pop A) bypass (PFTE) 09/12/2020 - pending R Fem-AKPop bypass   Plantar wart of right foot  PONV (postoperative nausea and vomiting)    Social History   Socioeconomic History   Marital status: Widowed    Spouse name: Not on file   Number of children: Not on file   Years of education: Not on file   Highest education level: Not on file  Occupational History   Not on file  Tobacco Use   Smoking status: Every Day    Packs/day: 0.50    Years: 50.00    Pack years: 25.00    Types: Cigarettes   Smokeless tobacco: Never  Vaping Use   Vaping Use: Never used  Substance and Sexual Activity   Alcohol use: Not Currently   Drug use: Never   Sexual activity: Not Currently  Other Topics Concern   Not on file  Social History Narrative   She is a mother of 2-but her son died in 2019-10-22 of a drug overdose.   Denies alcohol use.      Patient is widowed and lives with her daughter and several grandchildren locally in [place].     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     At baseline, she lives a sedentary lifestyle and does not exercise or get out of the house much at all.  She is limited by bilateral calf claudication.     She also experiences some exertional  shortness of breath which is chronic and stable.       She smokes between 1 and 2 packs of cigarettes daily and has been smoking since she was age 49.     Social Determinants of Health   Financial Resource Strain: Not on file  Food Insecurity: Not on file  Transportation Needs: Not on file  Physical Activity: Not on file  Stress: Not on file  Social Connections: Not on file   Scheduled Meds:  amiodarone  200 mg Oral QPM   apixaban  5 mg Oral BID   aspirin EC  81 mg Oral QPM   atorvastatin  20 mg Oral QPM   Chlorhexidine Gluconate Cloth  6 each Topical Q0600   collagenase   Topical Daily   darbepoetin (ARANESP) injection - DIALYSIS  60 mcg Intravenous Q Wed-HD   sevelamer carbonate  800 mg Oral TID WC   Continuous Infusions:  ceFEPime (MAXIPIME) IV 1 g (01/04/21 2022)   vancomycin     PRN Meds:.HYDROcodone-acetaminophen, metoprolol tartrate, ondansetron **OR** ondansetron (ZOFRAN) IV, oxyCODONE Medications Prior to Admission:  Prior to Admission medications   Medication Sig Start Date End Date Taking? Authorizing Provider  acetaminophen (TYLENOL) 650 MG CR tablet Take 650 mg by mouth every 8 (eight) hours as needed for pain.   Yes [provider]  amiodarone (PACERONE) 200 MG tablet Take 200 mg by mouth every evening. 06/10/20 06/10/21 Yes [provider]  apixaban (ELIQUIS) 5 MG TABS tablet Take 1 tablet (5 mg total) by mouth 2 (two) times daily. 12/10/19  Yes Leonie Man, MD  Ascorbic Acid (VITAMIN C) 1000 MG tablet Take 1,000 mg by mouth daily.   Yes [provider]  aspirin EC 81 MG tablet Take 81 mg by mouth every evening. Swallow whole.   Yes [provider]  atorvastatin (LIPITOR) 20 MG tablet Take 20 mg by mouth every evening.   Yes [provider]  bisacodyl (DULCOLAX) 5 MG EC tablet Take 1 tablet (5 mg total) by mouth daily as needed for moderate constipation. 12/10/20  Yes Wendee Beavers  T, MD  Dextrose, Diabetic Use,  (INSTA-GLUCOSE) 77.4 % GEL Take 1 Dose by mouth daily as needed (BG<70).   Yes [provider]  docusate sodium (COLACE) 100 MG capsule Take 100 mg by mouth daily.   Yes [provider]  erythromycin ophthalmic ointment Place 1 application into the right eye 3 (three) times daily.   Yes [provider]  famotidine (PEPCID) 20 MG tablet Take 1 tablet (20 mg total) by mouth 2 (two) times daily. 12/13/20  Yes Vann, Jessica U, DO  fluticasone (FLONASE) 50 MCG/ACT nasal spray Place 2 sprays into both nostrils as needed for allergies or rhinitis.   Yes [provider]  folic acid (FOLVITE) 195 MCG tablet Take 800 mcg by mouth daily.   Yes [provider]  glucagon (GLUCAGEN) 1 MG SOLR injection Inject 1 mg into the vein once as needed for low blood sugar. BG<70   Yes [provider]  hydrocortisone (ANUSOL-HC) 25 MG suppository Place 1 suppository (25 mg total) rectally 2 (two) times daily. 12/10/20  Yes Mercy Riding, MD  HYDROmorphone (DILAUDID) 4 MG tablet Take 1 tablet (4 mg total) by mouth every 4 (four) hours as needed for moderate pain (Patient is to receive TWO 4mg  tablet PO of Dilaudid ONE hour before dressing changes.). 12/12/20  Yes Vann, Jessica U, DO  insulin lispro (HUMALOG) 100 UNIT/ML KwikPen Inject 0-6 Units into the skin in the morning, at noon, in the evening, and at bedtime. If 70-150=0 units, 151-200=1 unit, 201-250=2 units, 251-300=3 units, 301-350=4 units, 351-400=6 units over 400 Call MD   Yes [provider]  linagliptin (TRADJENTA) 5 MG TABS tablet Take 5 mg by mouth every evening.   Yes [provider]  loperamide (IMODIUM A-D) 2 MG tablet Take 2 mg by mouth 4 (four) times daily as needed for diarrhea or loose stools.   Yes [provider]  metoCLOPramide (REGLAN) 5 MG tablet Take 1 tablet (5 mg total) by mouth every 8 (eight) hours as needed for refractory nausea / vomiting. 12/10/20 12/10/21 Yes Mercy Riding,  MD  montelukast (SINGULAIR) 10 MG tablet Take 10 mg by mouth daily as needed (allergies).   Yes [provider]  multivitamin (RENA-VIT) TABS tablet Take 1 tablet by mouth at bedtime. 12/10/20  Yes Mercy Riding, MD  Nutritional Supplements (,FEEDING SUPPLEMENT, PROSOURCE PLUS) liquid Take 30 mLs by mouth 3 (three) times daily between meals. 12/10/20  Yes Mercy Riding, MD  ondansetron (ZOFRAN) 4 MG tablet Take 1 tablet (4 mg total) by mouth daily as needed for up to 365 doses for nausea or vomiting. Patient taking differently: Take 8 mg by mouth every 12 (twelve) hours as needed for nausea or vomiting. 12/10/20  Yes Mercy Riding, MD  oxyCODONE-acetaminophen (PERCOCET/ROXICET) 5-325 MG tablet Take 1 tablet by mouth every 3 (three) hours as needed for severe pain.   Yes [provider]  sevelamer carbonate (RENVELA) 800 MG tablet Take 1 tablet (800 mg total) by mouth 3 (three) times daily with meals. 12/10/20  Yes Mercy Riding, MD  traZODone (DESYREL) 50 MG tablet Take 1 tablet (50 mg total) by mouth at bedtime as needed for sleep. 12/10/20  Yes Mercy Riding, MD  Darbepoetin Alfa (ARANESP) 200 MCG/0.4ML SOSY injection Inject 0.4 mLs (200 mcg total) into the vein every Monday with hemodialysis. 12/12/20   Mercy Riding, MD  glucose blood test strip 1 each by Other route as needed. Use as  instructed    [provider]  lidocaine-prilocaine (EMLA) cream Apply 1 application topically daily as needed (dialysis). 08/12/20   [provider]   Allergies  Allergen Reactions   Penicillins Anaphylaxis    Anaphylaxis as a child   Review of Systems  Physical Exam  Vital Signs: BP (!) 125/47 (BP Location: Right Arm)   Pulse 66   Temp 98.6 F (37 C) (Oral)   Resp 18   Wt 62.8 kg   SpO2 97%   BMI 24.53 kg/m  Pain Scale: 0-10   Pain Score: 0-No pain   SpO2: SpO2: 97 % O2 Device:SpO2: 97 % O2 Flow Rate: .   IO: Intake/output summary:  Intake/Output Summary (Last 24  hours) at 01/05/2021 1155 Last data filed at 01/05/2021 0859 Gross per 24 hour  Intake 600 ml  Output 1900 ml  Net -1300 ml    LBM: Last BM Date: 01/04/21 Baseline Weight: Weight: 62.8 kg Most recent weight: Weight:  (Unable to check bed wt)     Palliative Assessment/Data: PPS: 50%     Thank you for this consult. Palliative medicine will continue to follow and assist as needed.   Time In: 0954 Time Out: 1110 Time Total: 76 minutes Greater than 50%  of this time was spent counseling and coordinating care related to the above assessment and plan.  Signed by: Mariana Kaufman, AGNP-C Palliative Medicine    Please contact Palliative Medicine Team phone at [phone] for questions and concerns.  For individual provider: See Shea Evans

## 2021-01-05 NOTE — Consult Note (Signed)
Chief Complaint/Reason for Consultation:   HPI:  70 yo PMH ESRD, CAD, CABG, Afib, DM2 presents with R periorbital edema and ecchmysosis as was as large subconjunctival hemorrhage. Patient states her vision is "normal". Patient states she was told the periorbital hemotoma was related to coughing/retching. She states she was see by an eye doctor at [facility] for the right periorbital swelling before she was admitted to the hospital. She denies any pain or decreased vision. Denies flashes, floaters, or curtain over the vision.  ROS: otherwise as in HPI  No current facility-administered medications on file prior to encounter.   Current Outpatient Medications on File Prior to Encounter  Medication Sig Dispense Refill   acetaminophen (TYLENOL) 650 MG CR tablet Take 650 mg by mouth every 8 (eight) hours as needed for pain.     amiodarone (PACERONE) 200 MG tablet Take 200 mg by mouth every evening.     apixaban (ELIQUIS) 5 MG TABS tablet Take 1 tablet (5 mg total) by mouth 2 (two) times daily. 60 tablet 6   Ascorbic Acid (VITAMIN C) 1000 MG tablet Take 1,000 mg by mouth daily.     aspirin EC 81 MG tablet Take 81 mg by mouth every evening. Swallow whole.     atorvastatin (LIPITOR) 20 MG tablet Take 20 mg by mouth every evening.     bisacodyl (DULCOLAX) 5 MG EC tablet Take 1 tablet (5 mg total) by mouth daily as needed for moderate constipation. 30 tablet 0   Dextrose, Diabetic Use, (INSTA-GLUCOSE) 77.4 % GEL Take 1 Dose by mouth daily as needed (BG<70).     docusate sodium (COLACE) 100 MG capsule Take 100 mg by mouth daily.     erythromycin ophthalmic ointment Place 1 application into the right eye 3 (three) times daily.     famotidine (PEPCID) 20 MG tablet Take 1 tablet (20 mg total) by mouth 2 (two) times daily.     fluticasone (FLONASE) 50 MCG/ACT nasal spray Place 2 sprays into both nostrils as needed for allergies or rhinitis.     folic acid (FOLVITE) 751 MCG tablet Take 800 mcg by mouth daily.      glucagon (GLUCAGEN) 1 MG SOLR injection Inject 1 mg into the vein once as needed for low blood sugar. BG<70     hydrocortisone (ANUSOL-HC) 25 MG suppository Place 1 suppository (25 mg total) rectally 2 (two) times daily. 12 suppository 0   HYDROmorphone (DILAUDID) 4 MG tablet Take 1 tablet (4 mg total) by mouth every 4 (four) hours as needed for moderate pain (Patient is to receive TWO 4mg  tablet PO of Dilaudid ONE hour before dressing changes.). 6 tablet 0   insulin lispro (HUMALOG) 100 UNIT/ML KwikPen Inject 0-6 Units into the skin in the morning, at noon, in the evening, and at bedtime. If 70-150=0 units, 151-200=1 unit, 201-250=2 units, 251-300=3 units, 301-350=4 units, 351-400=6 units over 400 Call MD     linagliptin (TRADJENTA) 5 MG TABS tablet Take 5 mg by mouth every evening.     loperamide (IMODIUM A-D) 2 MG tablet Take 2 mg by mouth 4 (four) times daily as needed for diarrhea or loose stools.     metoCLOPramide (REGLAN) 5 MG tablet Take 1 tablet (5 mg total) by mouth every 8 (eight) hours as needed for refractory nausea / vomiting. 90 tablet 1   montelukast (SINGULAIR) 10 MG tablet Take 10 mg by mouth daily as needed (allergies).     multivitamin (RENA-VIT) TABS tablet Take 1 tablet by  mouth at bedtime.  0   Nutritional Supplements (,FEEDING SUPPLEMENT, PROSOURCE PLUS) liquid Take 30 mLs by mouth 3 (three) times daily between meals.     ondansetron (ZOFRAN) 4 MG tablet Take 1 tablet (4 mg total) by mouth daily as needed for up to 365 doses for nausea or vomiting. (Patient taking differently: Take 8 mg by mouth every 12 (twelve) hours as needed for nausea or vomiting.) 20 tablet 0   oxyCODONE-acetaminophen (PERCOCET/ROXICET) 5-325 MG tablet Take 1 tablet by mouth every 3 (three) hours as needed for severe pain.     sevelamer carbonate (RENVELA) 800 MG tablet Take 1 tablet (800 mg total) by mouth 3 (three) times daily with meals.     traZODone (DESYREL) 50 MG tablet Take 1 tablet (50 mg  total) by mouth at bedtime as needed for sleep.     Darbepoetin Alfa (ARANESP) 200 MCG/0.4ML SOSY injection Inject 0.4 mLs (200 mcg total) into the vein every Monday with hemodialysis. 1.68 mL    glucose blood test strip 1 each by Other route as needed. Use as instructed     lidocaine-prilocaine (EMLA) cream Apply 1 application topically daily as needed (dialysis).      Allergies  Allergen Reactions   Penicillins Anaphylaxis    Anaphylaxis as a child    Patient Active Problem List   Diagnosis Date Noted   Intractable nausea and vomiting 01/01/2021   Palliative care encounter    Calciphylaxis 12/01/2020   Limb ischemia 11/25/2020   Pressure injury of skin 11/01/2020   Iron deficiency anemia, unspecified 10/22/2020   Malnutrition of moderate degree 09/30/2020   Current use of long term anticoagulation    Weakness    Anemia 09/29/2020   Personal history of COVID-19 09/28/2020   COVID-19 08/22/2020   Hyperlipidemia associated with type 2 diabetes mellitus ([place]) 08/05/2020   Hypokalemia 07/27/2020   Unspecified abnormal findings in urine 07/26/2020   Unspecified severe protein-calorie malnutrition ([place]) 07/21/2020   Encounter for immunization 07/19/2020   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[place]) 07/16/2020   Coagulation defect, unspecified ([place]) 07/16/2020   Allergy, unspecified, initial encounter 07/15/2020   Anaphylactic shock, unspecified, initial encounter 07/15/2020   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   Pain, unspecified 07/15/2020   Pruritus, unspecified 07/15/2020   Secondary hyperparathyroidism of renal origin ([place]) 07/15/2020   Shortness of breath 07/15/2020   PAOD (peripheral arterial occlusive disease) ([place]) 07/08/2020   ESRD (end stage renal disease) on dialysis ([place]) 07/01/2020   PAF (paroxysmal atrial fibrillation) (HCC) -  CHA2DS2-VASc Score 5 (Age, Female, HTN, DM, Vascular)- On Eliquis 07/01/2020   Anemia of chronic disease 07/01/2020   Essential hypertension 07/01/2020    DM (diabetes mellitus), type 2 with renal complications ([place]) 12/75/1700   Cellulitis of toe 06/05/2020   S/P CABG (coronary artery bypass graft) 06/02/2020   Coronary artery disease involving native coronary artery of native heart without angina pectoris 06/01/2020   CKD (chronic kidney disease) stage 3, GFR 30-59 ml/min ([place]) 06/01/2020   HLD (hyperlipidemia) 06/01/2020   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 05/27/2020   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   Tobacco abuse 12/10/2019   AAA (abdominal aortic aneurysm) without rupture ([place]) 12/10/2019     EXAMINATION  VAsc (near with +2.5D lens): OD: 20/30+2 OS: 20/20-1  Pupils:  OD: Equal, round, reactive, no APD OS: Equal, round, reactive, no APD  T(Pen): OD: 21  mm Hg OS:  19  mm Hg  CVF: full to finger counting OU EOM: full OU,  no limitation or restriction OU  Anterior segment Exam (20D/indirect): Ext/Lids: right sided periorbital edema and ecchymoses Conj/Sclera: OD: subconjunctival hemorrahge and subconjunctival hematoma inferonasal measuring 66mm x 72mm. OS white and quiet OU. No conj or scleral lacerations OU. Cornea: clear OU, no abrasion or infiltrate OU AC: Deep and Quiet OU, no hyphema OU Iris: Round and Flat OU, no peaking OU Lens: PCIOL OU  Dilated OU with phenylephrine and tropicamide OU 1955 hours  Dilated Fundus Exam: Vitreous: Clear OU Disc: sharp and pink with 0.3 c/d OU Macula: flat and dry OU Vessels: normal distribution OU, attenuated OU, perfused OU Periphery: flat and attached 360 without breaks or tears OU, Dot-blot hemorrhages in 3 quadrants OU    Imp/Plan:  Right subconjunctival hemorrhage and subconjunctival hematoma. Recommend frequent lubrication with lacrilube or similar artificial tear ointment QID OD to prevent Dellen and minimize exposure keratopathy Given anti-coagulant use it may take several weeks for this to resolve. As this is a self limited condition I would not recommend  stopping the anti-coagulant Moderate non-proliferative diabetic retinopathy OU Recommend regular dilated eye exams q6-9 months in addition to glucose and blood pressure control Right periorbital edema and ecchymoses a. Expectant management, cool compresses as needed     Lonia Skinner, M.D. Ophthalmology [facility]

## 2021-01-05 NOTE — Consult Note (Addendum)
[place] Nurse wound follow up Due to the patient having calciphalxis, surgery is not recommended. I will place orders for PT to start Hydrotherapy to the sacral wounds followed by Santyl on M-Sa. The [facility] RN IS TO CHANGE THE DRESSING AND APPLY SANTYL ON Sunday. WOC will follow weekly for progress with the Hydrotherapy. Systemic antibiotics have been ordered by MD  Please re-consult the [place] team if needed.  Cathlean Marseilles Tamala Julian, MSN, RN, [place], Lysle Pearl, [facility] Wound Treatment Associate Pager [phone]

## 2021-01-05 NOTE — Plan of Care (Signed)
  Problem: Education: Goal: Knowledge of disease and its progression will improve Outcome: Progressing Goal: Individualized Educational Video(s) Outcome: Progressing   Problem: Fluid Volume: Goal: Compliance with measures to maintain balanced fluid volume will improve Outcome: Progressing   Problem: Health Behavior/Discharge Planning: Goal: Ability to manage health-related needs will improve Outcome: Progressing   Problem: Nutritional: Goal: Ability to make healthy dietary choices will improve Outcome: Progressing   Problem: Clinical Measurements: Goal: Complications related to the disease process, condition or treatment will be avoided or minimized Outcome: Progressing   Problem: Activity: Goal: Risk for activity intolerance will decrease Outcome: Progressing   Problem: Nutrition: Goal: Adequate nutrition will be maintained Outcome: Progressing

## 2021-01-05 NOTE — Progress Notes (Signed)
[facility] Progress Note  Subjective: seen in room, no c/o  Vitals:   01/05/21 0457 01/05/21 0836 01/05/21 0918 01/05/21 1242  BP: (!) 125/44 (!) 120/44 (!) 125/47 (!) 140/51  Pulse: 63 67 66 67  Resp: 17 17 18 16   Temp: 98.2 F (36.8 C) 98.3 F (36.8 C) 98.6 F (37 C)   TempSrc:  Oral Oral   SpO2: 91% 100% 97% 96%  Weight:        Exam: General: Chronically ill appearing,nad  Head: Right orbit swelling/erythema. Conjunctival hemorrhage  Neck: Supple. No JVD Lungs: CTA bilaterally without wheezes, rales, or rhonchi. Breathing is unlabored. Heart: RRR with S1 S2 Abdomen: soft non-tender  Ext: 1-2+ pitting LE edema to thighs, imrpoving. R TMA; L foot w/ gangrenous toes Neuro: A & O  X 3. Moves all extremities spontaneously. Psych:  Responds to questions appropriately with a normal affect. Dialysis Access: R IJ TDC; LUE AVF- significant bruising to arms    OP HD: [facility] MWF   3h 76min  250/500   2/2 bath  59.5kg  TDC + AVF (using intermittently)  Hep none  - mircera 100 q2 last 7/13, due 7/27  - Na thio 25 q HD  Assessment/ Plan: L foot gangrene/recent R TMA -- Early osteo on CT, on IV cefepime. VVS consulting.  ESRD -  HD MWF. Had tomorrow on schedule.  BP/volume- sig hip edema, 3kg up 2 days ago.  2.5 L UF goal tomorrow.  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 ESA due on 7/27, darbe 100ug weekly on Fri ordered Metabolic bone disease -  No Ca/Vit D 2/2 suspected calciphylaxis  Nutrition - Renal diet with fluid restriction  Afib - on Eliquis, amiodarone, Coreg       Rob Iyanni Hepp 01/05/2021, 1:23 PM   Recent Labs  Lab 01/04/21 0302 01/05/21 0338  K 3.7 3.4*  BUN 35* 16  CREATININE 5.41* 3.50*  CALCIUM 7.9* 8.0*  PHOS 4.7* 4.1  HGB 8.3* 7.4*    Inpatient medications:  amiodarone  200 mg Oral QPM   apixaban  5 mg  Oral BID   aspirin EC  81 mg Oral QPM   atorvastatin  20 mg Oral QPM   Chlorhexidine Gluconate Cloth  6 each Topical Q0600   collagenase   Topical Daily   darbepoetin (ARANESP) injection - DIALYSIS  60 mcg Intravenous Q Wed-HD   gabapentin  100 mg Oral QHS   sevelamer carbonate  800 mg Oral TID WC    ceFEPime (MAXIPIME) IV 1 g (01/04/21 2022)   vancomycin     HYDROcodone-acetaminophen, metoprolol tartrate, ondansetron **OR** ondansetron (ZOFRAN) IV, oxyCODONE, prochlorperazine

## 2021-01-05 NOTE — Progress Notes (Addendum)
PROGRESS NOTE  Kathryn Hancock UVO:[id number] DOB: Jun 21, 1950 DOA: 01/01/2021 PCP: Gala Lewandowsky, MD  HPI/ Kathryn Hancock is a 70 y.o. 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 Doren Custard vascular surgeon on December 28, 2020.  Patient has been feeling persistently nauseous unable to take in and was referred to the ER.  In addition patient's sister felt that the patient's left foot has become more swollen and has noticed some discharge.  Patient also developed right-sided periorbital hematoma over the last 1 week.  This was related to the coughing/retching. In the ER x-rays reveal foreign body of the left foot, and labs showed leukocytosis with WBC count of 19,000.  Patient also was hypoglycemic in the ER.  CT head followed by CT maxillofacial and CT abdomen pelvis was done which shows preseptal hematoma of the right orbit and nothing acute in the abdomen.  Patient had been empirically started on antibiotics for the left foot possible infection.  Admitted for further management.  COVID test was negative.    Subjective  Chest pain, reports generalized weakness, fatigue, reports appetite has improved, denies any changes in her vision.  .  Assessment/Plan: Principal Problem:   Intractable nausea and vomiting Active Problems:   Left atrial myxoma   AAA (abdominal aortic aneurysm) without rupture (HCC)   ESRD (end stage renal disease) on dialysis (HCC)   PAF (paroxysmal atrial fibrillation) (HCC) -  CHA2DS2-VASc Score 5 (Age, Female, HTN, DM, Vascular)- On Eliquis   Anemia of chronic disease   DM (diabetes mellitus), type 2 with renal complications (HCC)   S/P CABG (coronary artery bypass graft)     Possible cellulitis/early  osteomyelitis of left foot/ Known history of L gangrenous toes/ Recent R foot transmetatarsal amputation -she is currently afebrile, leukocytosis has resolved -Follow-up blood cultures, so far no growth to date CT left foot with possible early osteomyelitis, no organized abscess noted Vascular surgery consulted-no further recommendation, will not pursue any further amputation She is currently with IV vancomycin, cefepime, will discuss with vascular surgery if it is possible to change to oral. Monitor closely  Intractable nausea Etiology unclear, ?Unlikely uremia, possibly infection Currently afebrile, with leukocytosis CT abdomen pelvis unremarkable Seems to be improving.  ESRD on HD M/W/F Nephrology consulted,   Preseptal hematoma of the right eye Secondary to fall and facial trauma Currently on [facility], will need it as she has history of embolic episodes Denies any worsening vision today As well she appears to be with worsening subconjunctival hematoma, I have discussed with ophthalmology Dr. Eulas Post  who will see patient this evening  Calciphylaxis with significant wounds, right thigh wound Sacral decubitus ulcer -Wound care consulted.  Paroxysmal A. Fib Heart rate controlled Continue amiodarone, restart Eliquis on 01/04/21, d/c heparin due to oozing and blood draws Monitor for worsening bleeding Telemetry  Diabetes mellitus type 2 Last A1c 5 SSI, Accu-Cheks, hypoglycemic protocol  Anemia of chronic kidney disease Hemoglobin baseline 9-10 Anemia panel showed iron 28 Globin this morning 7.4, will transfuse 1 unit PRBC.  Goals of care discussion/Failure to thrive Palliative team consulted due to multiple comorbidities, poor prognosis, extremely poor life quality, with amputations, gangrene, calciphylaxis, pressure ulcers, dialysis dependent, failure to thrive, palliative have discussed with the family and  patient, plan to continue with full measures currently.   Estimated body  mass index is 33.85 kg/m as calculated from the following:   Height as of 12/28/20: 5\' 3"  (1.6 m).   Weight as of this encounter: 86.7 kg.     Code Status: Full  Family Communication: None at bedside   Disposition Plan: Status is: Inpatient  The patient will require care spanning > 2 midnights and should be moved to inpatient because: Inpatient level of care appropriate due to severity of illness  Dispo: The patient is from: SNF              Anticipated d/c is to: SNF              Patient currently is not medically stable to d/c.   Difficult to place patient No     Consultants: Vascular surgery Nephrology Palliaitve Neurology  Procedures: None  Antimicrobials: Vancomycin Cefepime  DVT prophylaxis: Eliquis   Objective: Vitals:   01/05/21 0836 01/05/21 0918 01/05/21 1242 01/05/21 1300  BP: (!) 120/44 (!) 125/47 (!) 140/51   Pulse: 67 66 67   Resp: 17 18 16    Temp: 98.3 F (36.8 C) 98.6 F (37 C)    TempSrc: Oral Oral    SpO2: 100% 97% 96%   Weight:    86.7 kg    Intake/Output Summary (Last 24 hours) at 01/05/2021 1347 Last data filed at 01/05/2021 1238 Gross per 24 hour  Intake 1460 ml  Output 1900 ml  Net -440 ml   Filed Weights   01/03/21 2049 01/05/21 1300  Weight: 62.8 kg 86.7 kg    Exam:  Awake Alert, frail, deconditioned, sitting at the edge of the bed  Patient with right eye bruising, and subconjunctival hemorrhage. Symmetrical Chest wall movement, Good air movement bilaterally, CTAB RRR,No Gallops,Rubs or new Murmurs, No Parasternal Heave +ve B.Sounds, Abd Soft, No tenderness, No rebound - guarding or rigidity. Multiple bruises/ecchymosis bilateral upper extremity, patient with sacral pressure ulcer, calciphylaxis wound on right upper thigh, bandaged, left foot dry gangrene, right foot  with swelling/staples       Data Reviewed: CBC: Recent Labs  Lab 01/01/21 1434 01/02/21 0500 01/03/21 0414 01/04/21 0302 01/05/21 0338  WBC  19.4* 13.1* 11.8* 9.9 8.9  NEUTROABS 17.3*  --  9.9* 8.2* 7.2  HGB 10.6* 8.3* 8.5* 8.3* 7.4*  HCT 33.1* 26.1* 26.5* 26.4* 24.3*  MCV 99.1 99.6 99.6 99.6 101.7*  PLT 292 201 179 146* 440*   Basic Metabolic Panel: Recent Labs  Lab 01/01/21 1434 01/02/21 0500 01/03/21 0415 01/04/21 0302 01/05/21 0338  NA 130* 128* 131* 135 134*  K 4.7 4.7 5.5* 3.7 3.4*  CL 87* 88* 91* 97* 98  CO2 23 19* 19* 23 22  GLUCOSE 110* 106* 78 115* 98  BUN 72* 80* 86* 35* 16  CREATININE 8.19* 8.41* 9.20* 5.41* 3.50*  CALCIUM 9.1 8.2* 8.5* 7.9* 8.0*  PHOS  --   --  6.8* 4.7* 4.1   GFR: Estimated Creatinine Clearance: 16.3 mL/min (A) (by C-G formula based on SCr of 3.5 mg/dL (H)). Liver Function Tests: Recent Labs  Lab 01/01/21 1434 01/02/21 0500 01/03/21 0415 01/04/21 0302 01/05/21 0338  AST 13* 11*  --   --   --   ALT 10 9  --   --   --   ALKPHOS 117 84  --   --   --   BILITOT 0.8 0.9  --   --   --  PROT 6.1* 4.5*  --   --   --   ALBUMIN 2.5* 1.9* 1.9* 1.8* 1.8*   Recent Labs  Lab 01/01/21 1434  LIPASE 29   No results for input(s): AMMONIA in the last 168 hours. Coagulation Profile: Recent Labs  Lab 01/01/21 1842  INR 1.8*   Cardiac Enzymes: No results for input(s): CKTOTAL, CKMB, CKMBINDEX, TROPONINI in the last 168 hours. BNP (last 3 results) No results for input(s): PROBNP in the last 8760 hours. HbA1C: No results for input(s): HGBA1C in the last 72 hours. CBG: Recent Labs  Lab 01/04/21 2011 01/05/21 0050 01/05/21 0330 01/05/21 0638 01/05/21 1134  GLUCAP 161* 129* 97 79 139*   Lipid Profile: No results for input(s): CHOL, HDL, LDLCALC, TRIG, CHOLHDL, LDLDIRECT in the last 72 hours. Thyroid Function Tests: No results for input(s): TSH, T4TOTAL, FREET4, T3FREE, THYROIDAB in the last 72 hours. Anemia Panel: Recent Labs    01/03/21 0414 01/03/21 0416  VITAMINB12 232  --   FOLATE  --  >100.0  FERRITIN 710*  --   TIBC 113*  --   IRON 28  --    Urine analysis:     Component Value Date/Time   COLORURINE AMBER (A) 09/30/2020 1035   APPEARANCEUR CLOUDY (A) 09/30/2020 1035   LABSPEC 1.012 09/30/2020 1035   PHURINE 8.0 09/30/2020 1035   GLUCOSEU NEGATIVE 09/30/2020 1035   HGBUR NEGATIVE 09/30/2020 1035   BILIRUBINUR NEGATIVE 09/30/2020 1035   KETONESUR NEGATIVE 09/30/2020 1035   PROTEINUR 30 (A) 09/30/2020 1035   NITRITE NEGATIVE 09/30/2020 1035   LEUKOCYTESUR LARGE (A) 09/30/2020 1035   Sepsis Labs: @LABRCNTIP (procalcitonin:4,lacticidven:4)  ) Recent Results (from the past 240 hour(s))  Resp Panel by RT-PCR (Flu A&B, Covid) Nasopharyngeal Swab     Status: None   Collection Time: 01/01/21  7:28 PM   Specimen: Nasopharyngeal Swab; Nasopharyngeal(NP) swabs in vial transport medium  Result Value Ref Range Status   SARS Coronavirus 2 by RT PCR NEGATIVE NEGATIVE Final    Comment: (NOTE) SARS-CoV-2 target nucleic acids are NOT DETECTED.  The SARS-CoV-2 RNA is generally detectable in upper respiratory specimens during the acute phase of infection. The lowest concentration of SARS-CoV-2 viral copies this assay can detect is 138 copies/mL. A negative result does not preclude SARS-Cov-2 infection and should not be used as the sole basis for treatment or other patient management decisions. A negative result may occur with  improper specimen collection/handling, submission of specimen other than nasopharyngeal swab, presence of viral mutation(s) within the areas targeted by this assay, and inadequate number of viral copies(<138 copies/mL). A negative result must be combined with clinical observations, patient history, and epidemiological information. The expected result is Negative.  Fact Sheet for Patients:  EntrepreneurPulse.com.au  Fact Sheet for Healthcare Providers:  IncredibleEmployment.be  This test is no t yet approved or cleared by the [place] FDA and  has been authorized for detection and/or  diagnosis of SARS-CoV-2 by FDA under an Emergency Use Authorization (EUA). This EUA will remain  in effect (meaning this test can be used) for the duration of the COVID-19 declaration under Section 564(b)(1) of the Act, 21 U.S.C.section 360bbb-3(b)(1), unless the authorization is terminated  or revoked sooner.       Influenza A by PCR NEGATIVE NEGATIVE Final   Influenza B by PCR NEGATIVE NEGATIVE Final    Comment: (NOTE) The Xpert Xpress SARS-CoV-2/FLU/RSV plus assay is intended as an aid in the diagnosis of influenza from Nasopharyngeal swab specimens and should not  be used as a sole basis for treatment. Nasal washings and aspirates are unacceptable for Xpert Xpress SARS-CoV-2/FLU/RSV testing.  Fact Sheet for Patients: EntrepreneurPulse.com.au  Fact Sheet for Healthcare Providers: IncredibleEmployment.be  This test is not yet approved or cleared by the [place] FDA and has been authorized for detection and/or diagnosis of SARS-CoV-2 by FDA under an Emergency Use Authorization (EUA). This EUA will remain in effect (meaning this test can be used) for the duration of the COVID-19 declaration under Section 564(b)(1) of the Act, 21 U.S.C. section 360bbb-3(b)(1), unless the authorization is terminated or revoked.  Performed at [facility], [place] [address], [place], [place] [address]   Blood culture (routine x 2)     Status: None (Preliminary result)   Collection Time: 01/01/21  8:08 PM   Specimen: BLOOD  Result Value Ref Range Status   Specimen Description BLOOD RIGHT ANTECUBITAL  Final   Special Requests   Final    BOTTLES DRAWN AEROBIC AND ANAEROBIC Blood Culture adequate volume   Culture   Final    NO GROWTH 3 DAYS Performed at [facility], [place] [address] [place], [place] [address]    Report Status PENDING  Incomplete      Studies: No results found.  Scheduled Meds:  amiodarone  200 mg Oral QPM   apixaban  5 mg  Oral BID   aspirin EC  81 mg Oral QPM   atorvastatin  20 mg Oral QPM   Chlorhexidine Gluconate Cloth  6 each Topical Q0600   collagenase   Topical Daily   [START ON 01/06/2021] darbepoetin (ARANESP) injection - DIALYSIS  100 mcg Intravenous Q Fri-HD   gabapentin  100 mg Oral QHS   sevelamer carbonate  800 mg Oral TID WC    Continuous Infusions:  ceFEPime (MAXIPIME) IV 1 g (01/04/21 2022)   vancomycin       LOS: 3 days     Phillips Climes, MD [facility]  If 7PM-7AM, please contact night-coverage [email or web address] 01/05/2021, 1:47 PM

## 2021-01-05 NOTE — Plan of Care (Signed)
  Problem: Education: Goal: Knowledge of disease and its progression will improve Outcome: Progressing   Problem: Fluid Volume: Goal: Compliance with measures to maintain balanced fluid volume will improve Outcome: Progressing   Problem: Health Behavior/Discharge Planning: Goal: Ability to manage health-related needs will improve Outcome: Progressing   Problem: Nutritional: Goal: Ability to make healthy dietary choices will improve Outcome: Progressing   Problem: Clinical Measurements: Goal: Complications related to the disease process, condition or treatment will be avoided or minimized Outcome: Progressing   Problem: Clinical Measurements: Goal: Ability to maintain clinical measurements within normal limits will improve Outcome: Progressing Goal: Respiratory complications will improve Outcome: Progressing Goal: Cardiovascular complication will be avoided Outcome: Progressing   Problem: Activity: Goal: Risk for activity intolerance will decrease Outcome: Progressing   Problem: Nutrition: Goal: Adequate nutrition will be maintained Outcome: Progressing   Problem: Coping: Goal: Level of anxiety will decrease Outcome: Progressing   Problem: Elimination: Goal: Will not experience complications related to bowel motility Outcome: Progressing   Problem: Pain Managment: Goal: General experience of comfort will improve Outcome: Progressing   Problem: Skin Integrity: Goal: Risk for impaired skin integrity will decrease Outcome: Progressing

## 2021-01-06 DIAGNOSIS — Z515 Encounter for palliative care: Secondary | ICD-10-CM

## 2021-01-06 DIAGNOSIS — D638 Anemia in other chronic diseases classified elsewhere: Secondary | ICD-10-CM | POA: Diagnosis not present

## 2021-01-06 DIAGNOSIS — R112 Nausea with vomiting, unspecified: Secondary | ICD-10-CM | POA: Diagnosis not present

## 2021-01-06 DIAGNOSIS — I96 Gangrene, not elsewhere classified: Secondary | ICD-10-CM | POA: Diagnosis not present

## 2021-01-06 DIAGNOSIS — N186 End stage renal disease: Secondary | ICD-10-CM | POA: Diagnosis not present

## 2021-01-06 LAB — CBC WITH DIFFERENTIAL/PLATELET
Abs Immature Granulocytes: 0.1 10*3/uL — ABNORMAL HIGH (ref 0.00–0.07)
Basophils Absolute: 0 10*3/uL (ref 0.0–0.1)
Basophils Relative: 0 %
Eosinophils Absolute: 0.2 10*3/uL (ref 0.0–0.5)
Eosinophils Relative: 2 %
HCT: 28 % — ABNORMAL LOW (ref 36.0–46.0)
Hemoglobin: 9.1 g/dL — ABNORMAL LOW (ref 12.0–15.0)
Immature Granulocytes: 1 %
Lymphocytes Relative: 14 %
Lymphs Abs: 1.2 10*3/uL (ref 0.7–4.0)
MCH: 31.7 pg (ref 26.0–34.0)
MCHC: 32.5 g/dL (ref 30.0–36.0)
MCV: 97.6 fL (ref 80.0–100.0)
Monocytes Absolute: 0.7 10*3/uL (ref 0.1–1.0)
Monocytes Relative: 8 %
Neutro Abs: 6.7 10*3/uL (ref 1.7–7.7)
Neutrophils Relative %: 75 %
Platelets: 111 10*3/uL — ABNORMAL LOW (ref 150–400)
RBC: 2.87 MIL/uL — ABNORMAL LOW (ref 3.87–5.11)
RDW: 17.2 % — ABNORMAL HIGH (ref 11.5–15.5)
Smear Review: DECREASED
WBC: 8.9 10*3/uL (ref 4.0–10.5)
nRBC: 0 % (ref 0.0–0.2)

## 2021-01-06 LAB — RENAL FUNCTION PANEL
Albumin: 1.7 g/dL — ABNORMAL LOW (ref 3.5–5.0)
Anion gap: 12 (ref 5–15)
BUN: 25 mg/dL — ABNORMAL HIGH (ref 8–23)
CO2: 25 mmol/L (ref 22–32)
Calcium: 8.4 mg/dL — ABNORMAL LOW (ref 8.9–10.3)
Chloride: 98 mmol/L (ref 98–111)
Creatinine, Ser: 4.88 mg/dL — ABNORMAL HIGH (ref 0.44–1.00)
GFR, Estimated: 9 mL/min — ABNORMAL LOW (ref >60)
Glucose, Bld: 84 mg/dL (ref 70–99)
Phosphorus: 4.2 mg/dL (ref 2.5–4.6)
Potassium: 3.5 mmol/L (ref 3.5–5.1)
Sodium: 135 mmol/L (ref 135–145)

## 2021-01-06 LAB — TYPE AND SCREEN
ABO/RH(D): O POS
Antibody Screen: NEGATIVE
Unit division: 0

## 2021-01-06 LAB — GLUCOSE, CAPILLARY
Glucose-Capillary: 76 mg/dL (ref 70–99)
Glucose-Capillary: 80 mg/dL (ref 70–99)
Glucose-Capillary: 134 mg/dL — ABNORMAL HIGH (ref 70–99)

## 2021-01-06 LAB — BPAM RBC
Blood Product Expiration Date: 202208292359
ISSUE DATE / TIME: 202207280839
Unit Type and Rh: 5100

## 2021-01-06 MED ORDER — DOXYCYCLINE HYCLATE 100 MG PO TABS
100.0000 mg | ORAL_TABLET | Freq: Two times a day (BID) | ORAL | Status: DC
  Administered 2021-01-06 – 2021-01-09 (×7): 100 mg via ORAL
  Filled 2021-01-06 (×7): qty 1

## 2021-01-06 MED ORDER — CEPHALEXIN 500 MG PO CAPS
500.0000 mg | ORAL_CAPSULE | ORAL | Status: DC
  Administered 2021-01-06 – 2021-01-09 (×4): 500 mg via ORAL
  Filled 2021-01-06 (×4): qty 1

## 2021-01-06 MED ORDER — SODIUM THIOSULFATE 250 MG/ML IV SOLN
25.0000 g | INTRAVENOUS | Status: DC
  Administered 2021-01-09: 25 g via INTRAVENOUS
  Filled 2021-01-06: qty 100

## 2021-01-06 MED ORDER — VANCOMYCIN HCL IN DEXTROSE 750-5 MG/150ML-% IV SOLN: 750.0000 mg | INTRAVENOUS | Status: DC

## 2021-01-06 MED ORDER — HEPARIN SODIUM (PORCINE) 1000 UNIT/ML IJ SOLN
4000.0000 [IU] | Freq: Once | INTRAMUSCULAR | Status: AC
  Administered 2021-01-06: 4000 [IU] via INTRAVENOUS

## 2021-01-06 NOTE — Progress Notes (Addendum)
Daily Progress Note   Patient Name: Kathryn Hancock       Date: 01/06/2021 DOB: 05/30/1951  Age: 70 y.o. MRN#: [id number] Attending Physician: Albertine Patricia, MD Primary Care Physician: Gala Lewandowsky, MD Admit Date: 01/01/2021  Reason for Consultation/Follow-up: Establishing goals of care  Subjective: PO intake improved significantly- 100% meals eaten per chart review. Nausea is improved.  Undergoing wound care.  Length of Stay: 4  Current Medications: Scheduled Meds:   amiodarone  200 mg Oral QPM   apixaban  5 mg Oral BID   artificial tears  1 application Right Eye T4H while awake   aspirin EC  81 mg Oral QPM   atorvastatin  20 mg Oral QPM   cephALEXin  500 mg Oral Q24H   Chlorhexidine Gluconate Cloth  6 each Topical Q0600   collagenase   Topical Daily   darbepoetin (ARANESP) injection - DIALYSIS  100 mcg Intravenous Q Fri-HD   doxycycline  100 mg Oral Q12H   gabapentin  100 mg Oral QHS   sevelamer carbonate  800 mg Oral TID WC    Continuous Infusions:  [START ON 01/09/2021] sodium thiosulfate infusion for calciphylaxis      PRN Meds: HYDROcodone-acetaminophen, metoprolol tartrate, ondansetron **OR** ondansetron (ZOFRAN) IV, oxyCODONE, prochlorperazine  Physical Exam Vitals and nursing note reviewed.  Pulmonary:     Effort: Pulmonary effort is normal.  Neurological:     Mental Status: She is alert.            Vital Signs: BP 130/74 (BP Location: Left Leg)   Pulse 65   Temp 98 F (36.7 C)   Resp 18   Wt 86.7 kg   SpO2 100%   BMI 33.86 kg/m  SpO2: SpO2: 100 % O2 Device: O2 Device: Room Air O2 Flow Rate:    Intake/output summary:  Intake/Output Summary (Last 24 hours) at 01/06/2021 1500 Last data filed at 01/06/2021 1241 Gross per 24 hour  Intake 780 ml   Output 2915 ml  Net -2135 ml   LBM: Last BM Date: 01/04/21 Baseline Weight: Weight: 62.8 kg Most recent weight: Weight: 86.7 kg       Palliative Assessment/Data: PPS:       Patient Active Problem List   Diagnosis Date Noted   Intractable nausea and vomiting 01/01/2021   Palliative care  encounter    Calciphylaxis 12/01/2020   Limb ischemia 11/25/2020   Pressure injury of skin 11/01/2020   Iron deficiency anemia, unspecified 10/22/2020   Malnutrition of moderate degree 09/30/2020   Current use of long term anticoagulation    Weakness    Anemia 09/29/2020   Personal history of COVID-19 09/28/2020   COVID-19 08/22/2020   Hyperlipidemia associated with type 2 diabetes mellitus ([place]) 08/05/2020   Hypokalemia 07/27/2020   Unspecified abnormal findings in urine 07/26/2020   Unspecified severe protein-calorie malnutrition ([place]) 07/21/2020   Encounter for immunization 07/19/2020   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[place]) 07/16/2020   Coagulation defect, unspecified ([place]) 07/16/2020   Allergy, unspecified, initial encounter 07/15/2020   Anaphylactic shock, unspecified, initial encounter 07/15/2020   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   Pain, unspecified 07/15/2020   Pruritus, unspecified 07/15/2020   Secondary hyperparathyroidism of renal origin ([place]) 07/15/2020   Shortness of breath 07/15/2020   PAOD (peripheral arterial occlusive disease) ([place]) 07/08/2020   ESRD (end stage renal disease) on dialysis ([place]) 07/01/2020   PAF (paroxysmal atrial fibrillation) (HCC) -  CHA2DS2-VASc Score 5 (Age, Female, HTN, DM, Vascular)- On Eliquis 07/01/2020   Anemia of chronic disease 07/01/2020   Essential hypertension 07/01/2020   DM (diabetes mellitus), type 2 with renal complications ([place]) 17/49/4496   Cellulitis of toe 06/05/2020   S/P CABG (coronary artery bypass graft) 06/02/2020   Coronary artery disease involving native coronary artery of native heart without angina pectoris 06/01/2020   CKD  (chronic kidney disease) stage 3, GFR 30-59 ml/min ([place]) 06/01/2020   HLD (hyperlipidemia) 06/01/2020   Dry gangrene ([place]) -> right foot-toes 05/27/2020   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   Tobacco abuse 12/10/2019   AAA (abdominal aortic aneurysm) without rupture ([place]) 12/10/2019    Palliative Care Assessment & Plan   Patient Profile: 70 y.o. 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 Workup reveals possible myelitis in L foot. Palliative medicine consulted for goals of care discussion.    Assessment/Recommendations/Plan  Patient and daughter clear in their [place] Recommend outpatient Palliative to follow Continue to offer compazine with oxycodone Continue gabapentin  Goals of Care and Additional Recommendations: Limitations on Scope of Treatment: Full Scope Treatment  Code Status: Full code  Prognosis:  Unable to determine  Discharge Planning: [facility] for rehab with Palliative care service follow-up  Care plan was discussed with patient and family.  Thank you for allowing the Palliative Medicine Team to assist in the care of this patient.   Total time: 16 mins Greater than 50%  of this time was spent counseling and coordinating care related to the above assessment and plan.  Mariana Kaufman, AGNP-C Palliative Medicine   Please contact Palliative Medicine Team phone at [phone] for questions and concerns.

## 2021-01-06 NOTE — Evaluation (Signed)
Physical Therapy Evaluation Patient Details Name: Kathryn Hancock MRN: [id number] DOB: 03/13/1951 Today's Date: 01/06/2021   History of Present Illness  Kathryn Hancock is a 70 y.o. female who presented 01/01/21 with persistent nausea. Admitted for intractable nausea & vomiting. CT left foot with possible early osteomyelitis. PMH: PAD with status post left and right leg bypasses, chronic bilateral toes ischemia, unhealing wound of right thigh after right femoropopliteal bypass, ESRD on HD TTS, PAF on Eliquis, IDDM, HTN, CAD status post CABG, HTN, HLD, recent admit for gangrenous bilateral foot with history of peripheral vascular disease status post recent transmetatarsal amputation of the right foot & discharge on December 13, 2020 to SNF, after the amputation had follow-up with Dr. Doren Custard vascular surgeon on December 28, 2020.   Clinical Impression  Pt presents with condition above and deficits mentioned below, see PT Problem List. Most recently, she was at a SNF only able to tolerate transfers to her w/c. Prior to admission in early July, 2022, pt was using rollator to ambulate short distances. Currently, pt is requiring minA for bed mobility using bed controls and rails, for transfers, and for ambulating up to ~10 ft with a RW. Pt displays decreased skin integrity, overall weakness, decreased activity tolerance, and impaired balance that place her at risk for falls. Recommend return to SNF for short-term rehab. Will continue to follow acutely.    Follow Up Recommendations Supervision/Assistance - 24 hour;SNF    Equipment Recommendations  Other (comment) (defer to next venue of care)    Recommendations for Other Services       Precautions / Restrictions Precautions Precautions: Fall Precaution Comments: recent R transmet amputation; L foot gangrene; multiple wounds Restrictions Weight Bearing Restrictions: No Other Position/Activity Restrictions: Per chart 12/12/20 pt was PWB through R heel with Darco shoe  donned. Cued to place weight through heels today as Darco shoe at her SNF currently.      Mobility  Bed Mobility Overal bed mobility: Needs Assistance Bed Mobility: Sit to Supine;Supine to Sit     Supine to sit: Min assist;HOB elevated Sit to supine: Min assist   General bed mobility comments: Extra time and minA to scoot hips towards EOB with HOB elevated. Pt pulling on PT's hand to ascend trunk. MinA to manage legs back to supine.    Transfers Overall transfer level: Needs assistance Equipment used: Rolling walker (2 wheeled) Transfers: Sit to/from Stand Sit to Stand: Min assist         General transfer comment: Extra time and minA to power up to stand to RW.  Ambulation/Gait Ambulation/Gait assistance: Min assist Gait Distance (Feet): 10 Feet Assistive device: Rolling walker (2 wheeled) Gait Pattern/deviations: Step-through pattern;Decreased stride length;Trunk flexed Gait velocity: slowed Gait velocity interpretation: <1.31 ft/sec, indicative of household ambulator General Gait Details: Cued pt to place weight through heels. Pt with poor posture, taking small slow steps. Pt only able to tolerate ~10 ft due to calf pain. No LOB, minA for stability.  Stairs            Wheelchair Mobility    Modified Rankin (Stroke Patients Only)       Balance Overall balance assessment: Needs assistance Sitting-balance support: No upper extremity supported;Feet supported Sitting balance-Leahy Scale: Fair Sitting balance - Comments: Static sitting EOB with supervision.   Standing balance support: Bilateral upper extremity supported Standing balance-Leahy Scale: Poor Standing balance comment: MinA and bil UE support for standing mobility.  Pertinent Vitals/Pain Pain Assessment: Faces Faces Pain Scale: Hurts little more Pain Location: Left foot, bil calves with mobility Pain Descriptors / Indicators:  Aching;Throbbing;Discomfort;Grimacing;Cramping Pain Intervention(s): Monitored during session;Limited activity within patient's tolerance;Repositioned;Other (comment) (notified RN of calf pain that pt reported has been her norm for years, no redness or warmth noted)    Home Living Family/patient expects to be discharged to:: Skilled nursing facility Living Arrangements: Children Available Help at Discharge: Available 24 hours/day           Home Equipment: Bedside commode;Walker - 4 wheels;Tub bench;Wheelchair - manual Additional Comments: Prior to recent SNF stay pt daughter provided 24/7 care    Prior Function Level of Independence: Needs assistance   Gait / Transfers Assistance Needed: Prior to admission in early July, 2022, pt was using rollator to amb short distances. She has a manual w/c at home but does not like to use. She has most recently been using a RW at SNF per her report. Reports that she has only been able to perform transfers to her w/c at the SNF recently.  ADL's / Homemaking Assistance Needed: Pt states that she is Mod I sponge bathing.        Hand Dominance   Dominant Hand: Right    Extremity/Trunk Assessment   Upper Extremity Assessment Upper Extremity Assessment: Defer to OT evaluation    Lower Extremity Assessment Lower Extremity Assessment: Generalized weakness;LLE deficits/detail;RLE deficits/detail RLE Deficits / Details: s/p recent transmet amputation; edema noted RLE Sensation:  (denies numbness/tingling) RLE Coordination: decreased gross motor LLE Deficits / Details: L gangrene at foot; edema noted; decreased sensation at big toe LLE Sensation: decreased light touch (at big toe) LLE Coordination: decreased gross motor    Cervical / Trunk Assessment Cervical / Trunk Assessment: Kyphotic  Communication   Communication: No difficulties  Cognition Arousal/Alertness: Awake/alert Behavior During Therapy: WFL for tasks  assessed/performed;Anxious Overall Cognitive Status: Within Functional Limits for tasks assessed                                 General Comments: Very motivated and pleasant.      General Comments      Exercises     Assessment/Plan    PT Assessment Patient needs continued PT services  PT Problem List Decreased strength;Decreased activity tolerance;Decreased mobility;Decreased knowledge of use of DME;Decreased knowledge of precautions;Pain;Decreased range of motion;Decreased balance;Decreased coordination;Decreased skin integrity;Impaired sensation       PT Treatment Interventions DME instruction;Gait training;Functional mobility training;Therapeutic activities;Therapeutic exercise;Patient/family education;Wheelchair mobility training;Balance training;Neuromuscular re-education    PT Goals (Current goals can be found in the Care Plan section)  Acute Rehab PT Goals Patient Stated Goal: Return to SNF Rehab when able PT Goal Formulation: With patient Time For Goal Achievement: 01/20/21 Potential to Achieve Goals: Good    Frequency Min 2X/week   Barriers to discharge        Co-evaluation               AM-PAC PT "6 Clicks" Mobility  Outcome Measure Help needed turning from your back to your side while in a flat bed without using bedrails?: A Little Help needed moving from lying on your back to sitting on the side of a flat bed without using bedrails?: A Little Help needed moving to and from a bed to a chair (including a wheelchair)?: A Little Help needed standing up from a chair using your arms (e.g., wheelchair or bedside  chair)?: A Little Help needed to walk in hospital room?: A Little Help needed climbing 3-5 steps with a railing? : Total 6 Click Score: 16    End of Session Equipment Utilized During Treatment: Gait belt Activity Tolerance: Patient tolerated treatment well Patient left: in bed;with bed alarm set;with call bell/phone within  reach Nurse Communication: Mobility status;Other (comment) (pain in calves but no redness/warmth with pt reporting this is her norm for past several years) PT Visit Diagnosis: Other abnormalities of gait and mobility (R26.89);Muscle weakness (generalized) (M62.81);Difficulty in walking, not elsewhere classified (R26.2);Pain;Unsteadiness on feet (R26.81) Pain - Right/Left:  (bil) Pain - part of body: Leg;Ankle and joints of foot    Time: 1550-1610 PT Time Calculation (min) (ACUTE ONLY): 20 min   Charges:   PT Evaluation $PT Eval Moderate Complexity: 1 Mod          Moishe Spice, PT, DPT Acute Rehabilitation Services  Pager: [phone] Office: [place] 01/06/2021, 4:59 PM

## 2021-01-06 NOTE — Therapy (Signed)
Occupational Therapy Evaluation Patient Details Name: Kathryn Hancock MRN: [id number] DOB: 1950/07/28 Today's Date: 01/06/2021    History of Present Illness Kathryn Hancock is a 70 y.o. female with medical history significant of PAD with status post left and right leg bypasses, chronic bilateral toes ischemia, unhealing wound of right thigh after right femoropopliteal bypass, ESRD on HD TTS, PAF on Eliquis, IDDM, HTN, CAD status post CABG, HTN, HLD, recent admit for gangrenous bilateral foot with history of peripheral vascular disease status post recent transmetatarsal amputation of the right foot & discharge on December 13, 2020 to SNF, after the amputation had follow-up with Dr. Doren Custard vascular surgeon on December 28, 2020.  Patient has been feeling persistently nauseous. Admitted for intractable nausea & vomiting.   Clinical Impression   Pt admitted as above with deficits as stated below. Pt has extensive PMH & currently requires Min-Mod A for ADL's, self care and functional mobility related to self care tasks. She was noted to have 1 LOB posteriorly while using RW, requiring Mod A to correct. She should benefit from cont acute OT to assist in maximizing independence with ADL's and self care as well as pt education and a/e instruction. She plans to d/c to SNF Rehab when medically able to d/c. Pt was educated that she may use 3:1 at bedside with nursing/staff assistance. Stressed using call bell at all times secondary to overall weakness, recent LE amputations and for safety. Pt verbalized understanding of this.    Follow Up Recommendations  SNF;Supervision/Assistance - 24 hour    Equipment Recommendations  Other (comment) (Defer to next venue)    Recommendations for Other Services PT consult     Precautions / Restrictions Precautions Precautions: Fall Restrictions Weight Bearing Restrictions: No      Mobility Bed Mobility Overal bed mobility: Needs Assistance Bed Mobility: Sit to Supine (Pt sitting  up at EOB upon OT arrival.)       Sit to supine: Min guard   General bed mobility comments: Pt was able to use bed rails to pull herself up in bed. Increased time to bring R LE up onto bed, no hands on assist.    Transfers Overall transfer level: Needs assistance Equipment used: Rolling walker (2 wheeled) Transfers: Sit to/from Stand Sit to Stand: Min assist;Mod assist (1 LOB requiring Mod A to correct when standing using RW to side step and get back in bed for HD)              Balance Overall balance assessment: Needs assistance Sitting-balance support: No upper extremity supported;Feet supported Sitting balance-Leahy Scale: Fair     Standing balance support: Bilateral upper extremity supported Standing balance-Leahy Scale: Poor Standing balance comment: Min - Mod A for static/dynamic tasks for transfer            ADL either performed or assessed with clinical judgement   ADL Overall ADL's : Needs assistance/impaired Eating/Feeding: Independent   Grooming: Set up;Sitting   Upper Body Bathing: Set up;Sitting   Lower Body Bathing: Minimal assistance;Cueing for safety;Cueing for sequencing;Sitting/lateral leans;Sit to/from stand (Sit to stand from EOB)   Upper Body Dressing : Set up;Sitting   Lower Body Dressing: Set up;Minimal assistance;Sitting/lateral leans;Sit to/from stand (Pt wa sMod I donning socks sitting at EOB, however, she requires Min A for sit to stand w/ RW for safety.)   Toilet Transfer: Minimal assistance;Stand-pivot;BSC (Pt is weak in LE's and w/ recent surgeries, req 3:1 at bedside with Min A using RW for SPT.)  Toileting- Clothing Manipulation and Hygiene: Minimal assistance;Sitting/lateral lean;Sit to/from stand       Functional mobility during ADLs: Minimal assistance;Moderate assistance;Cueing for safety;Rolling walker (Pt with 1 LOB posteriorly requiring Mod A) General ADL Comments: Pt reports that she feels as though she is requiring  increased assist for ADL's and functional transfers as she has had many recent surgeries. She states that she plans to return to SNF     Vision Baseline Vision/History: Wears glasses              Pertinent Vitals/Pain Pain Assessment: 0-10 Pain Score: 2  Faces Pain Scale: Hurts a little bit Pain Location: Left foot Pain Descriptors / Indicators: Aching;Throbbing Pain Intervention(s): Monitored during session;Limited activity within patient's tolerance     Hand Dominance Right   Extremity/Trunk Assessment Upper Extremity Assessment Upper Extremity Assessment: Generalized weakness;Overall [facility] for tasks assessed   Lower Extremity Assessment Lower Extremity Assessment: Defer to PT evaluation;Generalized weakness       Communication Communication Communication: No difficulties   Cognition Arousal/Alertness: Awake/alert Behavior During Therapy: WFL for tasks assessed/performed;Anxious Overall Cognitive Status: Within Functional Limits for tasks assessed        General Comments  Pt w/ noted extensive bruising bilateral UE's and hematoma in R eye.            Home Living Family/patient expects to be discharged to:: Skilled nursing facility Living Arrangements: Children Available Help at Discharge: Available 24 hours/day        Bathroom Shower/Tub: Tub/shower unit;Curtain   Bathroom Toilet: Standard     Home Equipment: Bedside commode;Walker - 4 wheels;Tub bench;Wheelchair - manual   Additional Comments: Prior to recent SNF stay pt daughter provided 24/7 care      Prior Functioning/Environment Level of Independence: Needs assistance  Gait / Transfers Assistance Needed: Prior to admission in early July, 2022, pt was using rollator to amb short distances. She has a manual w/c at home but does not like to use. She has most recently been using a RW at SNF per her report. ADL's / Homemaking Assistance Needed: Pt states that she is Mod I sponge bathing.       OT  Problem List: Decreased activity tolerance;Decreased strength;Impaired balance (sitting and/or standing);Decreased knowledge of use of DME or AE;Pain      OT Treatment/Interventions: Self-care/ADL training;Energy conservation;DME and/or AE instruction;Therapeutic activities;Patient/family education    OT Goals(Current goals can be found in the care plan section) Acute Rehab OT Goals Patient Stated Goal: Return to SNF Rehab when able OT Goal Formulation: With patient Time For Goal Achievement: 01/20/21 Potential to Achieve Goals: Fair ADL Goals Pt Will Perform Grooming: Independently;sitting Pt Will Perform Lower Body Bathing: with supervision;sitting/lateral leans;sit to/from stand;with adaptive equipment Pt Will Perform Lower Body Dressing: with supervision;sitting/lateral leans;sit to/from stand;with adaptive equipment Pt Will Transfer to Toilet: with modified independence;stand pivot transfer;bedside commode Pt Will Perform Toileting - Clothing Manipulation and hygiene: with modified independence;sitting/lateral leans Additional ADL Goal #1: Pt will be Mod I using long handled A/E during ADL and self care tasks as a means for increaseing pt indepdenence  OT Frequency: Min 2X/week   Barriers to D/C: Other (comment) (Pt plans to d/c to SNF Rehab)  Supportive daughter          AM-PAC OT "6 Clicks" Daily Activity     Outcome Measure Help from another person eating meals?: None Help from another person taking care of personal grooming?: A Little Help from another person toileting, which includes using  toliet, bedpan, or urinal?: A Little Help from another person bathing (including washing, rinsing, drying)?: A Little Help from another person to put on and taking off regular upper body clothing?: A Little Help from another person to put on and taking off regular lower body clothing?: A Little 6 Click Score: 19   End of Session Equipment Utilized During Treatment: Gait belt;Rolling  walker Nurse Communication: Mobility status;Other (comment) (Pt going to HD)  Activity Tolerance: Patient tolerated treatment well;Other (comment) (Treatment limited secondary to transport waiting to being to HD.) Patient left: in bed;Other (comment) (Transport taking pt to HD)  OT Visit Diagnosis: Unsteadiness on feet (R26.81);Other abnormalities of gait and mobility (R26.89);Muscle weakness (generalized) (M62.81)                Time: 3664-4034 OT Time Calculation (min): 27 min Charges:  OT General Charges $OT Visit: 1 Visit OT Evaluation $OT Eval Moderate Complexity: 1 Mod  Kikue Gerhart Beth Dixon, OTR/L 01/06/2021, 9:21 AM

## 2021-01-06 NOTE — TOC Progression Note (Signed)
Transition of Care [name]) - Progression Note    Patient Details  Name: Kathryn Hancock MRN: [id number] Date of Birth: Oct 18, 1950  [facility] CM/SW Contact  Sharlet Salina Mila Homer, LCSW Phone Number: 01/06/2021, 6:11 PM  Clinical Narrative:  On 7/28, CSW talked with Johann Capers, admissions Mudlogger at [facility] in [place]. Confirmed that patient came to the hospital from their facility. When asked, Johann Capers informed that not sure when patient will be ready for discharge.          Expected Discharge Plan and Services  CSW will f/u with patient regarding d/c plan.                                               Social Determinants of Health (SDOH) Interventions    Readmission Risk Interventions Readmission Risk Prevention Plan 11/02/2020  Transportation Screening Complete  Medication Review Press photographer) Complete  HRI or [place] Complete  SW Recovery Care/Counseling Consult Complete  Palliative Care Screening Not [place] Not Applicable

## 2021-01-06 NOTE — Care Management Important Message (Signed)
Important Message  Patient Details  Name: Kathryn Hancock MRN: [id number] Date of Birth: 27-Nov-1950   Medicare Important Message Given:  Yes     Memory Argue 01/06/2021, 5:16 PM

## 2021-01-06 NOTE — Progress Notes (Signed)
PT Cancellation Note  Patient Details Name: Kathryn Hancock MRN: [id number] DOB: 1950-09-28   Cancelled Treatment:    Reason Eval/Treat Not Completed: (P) Patient at procedure or test/unavailable Off floor for HD. PT will follow back for Eval this afternoon as able.   Sherlin Sonier B. Migdalia Dk PT, DPT Acute Rehabilitation Services Pager [phone] Office [phone]    [place] 01/06/2021, 9:28 AM

## 2021-01-06 NOTE — Progress Notes (Signed)
[facility] Progress Note  Subjective: seen in room, no c/o  Vitals:   01/06/21 1000 01/06/21 1030 01/06/21 1100 01/06/21 1130  BP: 133/61 (!) 144/57 137/61 120/66  Pulse: 61 60 61 63  Resp: 13 12    Temp:      TempSrc:      SpO2:      Weight:        Exam: General: Chronically ill appearing,nad  Head: Right orbit swelling/erythema. Conjunctival hemorrhage  Neck: Supple. No JVD Lungs: CTA bilaterally without wheezes, rales, or rhonchi. Breathing is unlabored. Heart: RRR with S1 S2 Abdomen: soft non-tender  Ext: 1-2+ pitting LE edema to thighs, imrpoving. R TMA; L foot w/ gangrenous toes Neuro: A & O  X 3. Moves all extremities spontaneously. Psych:  Responds to questions appropriately with a normal affect. Dialysis Access: R IJ TDC; LUE AVF- significant bruising to arms    OP HD: [facility] MWF   3h 47min  250/500   2/2 bath  59.5kg  TDC + AVF (using intermittently)  Hep none  - mircera 100 q2 last 7/13, due 7/27  - Na thio 25 q HD  Assessment/ Plan: L foot gangrene/recent R TMA -- Early osteo on CT, on IV cefepime. VVS consulted, no plans for further amputation given poor overall prognosis.  ESRD -  HD MWF. HD today.  BP/volume- sig hip edema, 3kg up 2 days ago.  2.5 L UF goal on HD today. Wt's are off.  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 Not tolerating pain meds at home, they make her vomit. PCT recommending neurontin at 300/mg per day.  Anemia  - Hgb 8.3. ESA due on 7/27, darbe 100ug weekly on Fri ordered Metabolic bone disease -  No Ca/Vit D 2/2 suspected calciphylaxis  Nutrition - Renal diet with fluid restriction  Afib - on Eliquis, amiodarone, Coreg   GOC - PCT is working w/ pt and family     Kelly Splinter 01/06/2021, 1:17 PM   Recent Labs  Lab 01/05/21 0338 01/06/21 0338  K 3.4* 3.5  BUN 16 25*  CREATININE 3.50* 4.88*  CALCIUM  8.0* 8.4*  PHOS 4.1 4.2  HGB 7.4* 9.1*    Inpatient medications:  amiodarone  200 mg Oral QPM   apixaban  5 mg Oral BID   artificial tears  1 application Right Eye C3J while awake   aspirin EC  81 mg Oral QPM   atorvastatin  20 mg Oral QPM   Chlorhexidine Gluconate Cloth  6 each Topical Q0600   collagenase   Topical Daily   darbepoetin (ARANESP) injection - DIALYSIS  100 mcg Intravenous Q Fri-HD   gabapentin  100 mg Oral QHS   heparin sodium (porcine)  4,000 Units Intravenous Once   sevelamer carbonate  800 mg Oral TID WC    ceFEPime (MAXIPIME) IV 1 g (01/05/21 2020)   vancomycin 750 mg (01/06/21 1132)   HYDROcodone-acetaminophen, metoprolol tartrate, ondansetron **OR** ondansetron (ZOFRAN) IV, oxyCODONE, prochlorperazine

## 2021-01-06 NOTE — Progress Notes (Signed)
Physical Therapy Wound Evaluation/Treatment Patient Details  Name: Kathryn Hancock MRN: [id number] Date of Birth: 04-17-1951  Today's Date: 01/06/2021 Time: 0174-9449 Time Calculation (min): 95 min  Subjective  Subjective Assessment Subjective: Pt pleasant and agreeable to therapy. Patient and Family Stated Goals: Heal wounds, decrease pain Date of Onset:  (Unknown) Prior Treatments: Dressing changes  Pain Score:  Pt was premedicated and tolerated treatment fairly well however still complaining of pain.   Wound Assessment  Pressure Injury Flank Left Unstageable - Full thickness tissue loss in which the base of the injury is covered by slough (yellow, tan, gray, green or brown) and/or eschar (tan, brown or black) in the wound bed. (Active)  Wound Image   01/06/21 1527  Dressing Type ABD;Barrier Film (skin prep);Gauze (Comment);Moist to moist;Santyl 01/06/21 1527  Dressing Changed;Clean;Dry;Intact 01/06/21 1527  Dressing Change Frequency Daily 01/06/21 1527  State of Healing Eschar 01/06/21 1527  Site / Wound Assessment Black 01/06/21 1527  % Wound base Red or Granulating 0% 01/06/21 1527  % Wound base Yellow/Fibrinous Exudate 0% 01/06/21 1527  % Wound base Black/Eschar 100% 01/06/21 1527  % Wound base Other/Granulation Tissue (Comment) 0% 01/06/21 1527  Peri-wound Assessment Intact;Pink 01/06/21 1527  Wound Length (cm) 2.5 cm 01/06/21 1400  Wound Width (cm) 9 cm 01/06/21 1400  Wound Depth (cm) 0 cm 01/06/21 1400  Wound Surface Area (cm^2) 22.5 cm^2 01/06/21 1400  Wound Volume (cm^3) 0 cm^3 01/06/21 1400  Tunneling (cm) 0 01/06/21 1527  Undermining (cm) 0 01/06/21 1527  Margins Unattached edges (unapproximated) 01/06/21 1527  Drainage Amount Minimal 01/06/21 1527  Drainage Description Purulent 01/06/21 1527  Treatment Debridement (Selective);Hydrotherapy (Pulse lavage);Packing (Saline gauze) 01/06/21 1527     Pressure Injury 01/03/21 Buttocks Left Unstageable - Full thickness tissue  loss in which the base of the injury is covered by slough (yellow, tan, gray, green or brown) and/or eschar (tan, brown or black) in the wound bed. (Active)  Dressing Type ABD;Barrier Film (skin prep);Gauze (Comment);Moist to moist;Santyl 01/06/21 1527  Dressing Changed;Clean;Dry;Intact 01/06/21 1527  Dressing Change Frequency Daily 01/06/21 1527  State of Healing Eschar 01/06/21 1527  Site / Wound Assessment Black 01/06/21 1527  % Wound base Red or Granulating 0% 01/06/21 1527  % Wound base Yellow/Fibrinous Exudate 40% 01/06/21 1527  % Wound base Black/Eschar 60% 01/06/21 1527  % Wound base Other/Granulation Tissue (Comment) 0% 01/06/21 1527  Peri-wound Assessment Intact;Pink 01/06/21 1527  Wound Length (cm) 6 cm 01/06/21 1400  Wound Width (cm) 6.5 cm 01/06/21 1400  Wound Depth (cm) 0 cm 01/06/21 1400  Wound Surface Area (cm^2) 39 cm^2 01/06/21 1400  Wound Volume (cm^3) 0 cm^3 01/06/21 1400  Tunneling (cm) 0 01/06/21 1527  Undermining (cm) 0 01/06/21 1527  Margins Unattached edges (unapproximated) 01/06/21 1527  Drainage Amount Minimal 01/06/21 1527  Drainage Description Purulent 01/06/21 1527  Treatment Debridement (Selective);Hydrotherapy (Pulse lavage);Packing (Saline gauze) 01/06/21 1527     Pressure Injury 11/28/20 Buttocks Right Unstageable - Full thickness tissue loss in which the base of the injury is covered by slough (yellow, tan, gray, green or brown) and/or eschar (tan, brown or black) in the wound bed. (Active)  Wound Image   01/06/21 1527  Dressing Type ABD;Barrier Film (skin prep);Gauze (Comment);Moist to moist;Santyl 01/06/21 1527  Dressing Changed;Clean;Dry;Intact 01/06/21 1527  Dressing Change Frequency Daily 01/06/21 1527  State of Healing Eschar 01/06/21 1527  Site / Wound Assessment Black;Yellow 01/06/21 1527  % Wound base Red or Granulating 0% 01/06/21 1527  % Wound base Yellow/Fibrinous  Exudate 15% 01/06/21 1527  % Wound base Black/Eschar 85% 01/06/21 1527  %  Wound base Other/Granulation Tissue (Comment) 0% 01/06/21 1527  Peri-wound Assessment Intact;Pink 01/06/21 1527  Wound Length (cm) 13 cm 01/06/21 1400  Wound Width (cm) 11 cm 01/06/21 1400  Wound Depth (cm) 0 cm 01/06/21 1400  Wound Surface Area (cm^2) 143 cm^2 01/06/21 1400  Wound Volume (cm^3) 0 cm^3 01/06/21 1400  Tunneling (cm) 0 01/06/21 1527  Undermining (cm) 0 01/06/21 1527  Margins Unattached edges (unapproximated) 01/06/21 1527  Drainage Amount Minimal 01/06/21 1527  Drainage Description Purulent 01/06/21 1527  Treatment Debridement (Selective);Hydrotherapy (Pulse lavage);Packing (Saline gauze) 01/06/21 1527     Pressure Injury 01/03/21 Hip Left Unstageable - Full thickness tissue loss in which the base of the injury is covered by slough (yellow, tan, gray, green or brown) and/or eschar (tan, brown or black) in the wound bed. (Active)  Dressing Type ABD;Barrier Film (skin prep);Gauze (Comment);Moist to moist;Santyl 01/06/21 1527  Dressing Changed;Clean;Dry;Intact 01/06/21 1527  Dressing Change Frequency Daily 01/06/21 1527  State of Healing Eschar 01/06/21 1527  Site / Wound Assessment Black 01/06/21 1527  % Wound base Red or Granulating 0% 01/06/21 1527  % Wound base Yellow/Fibrinous Exudate 0% 01/06/21 1527  % Wound base Black/Eschar 100% 01/06/21 1527  % Wound base Other/Granulation Tissue (Comment) 0% 01/06/21 1527  Peri-wound Assessment Intact;Pink 01/06/21 1527  Wound Length (cm) 1.8 cm 01/06/21 1400  Wound Width (cm) 2.5 cm 01/06/21 1400  Wound Depth (cm) 0 cm 01/06/21 1400  Wound Surface Area (cm^2) 4.5 cm^2 01/06/21 1400  Wound Volume (cm^3) 0 cm^3 01/06/21 1400  Tunneling (cm) 0 01/06/21 1527  Undermining (cm) 0 01/06/21 1527  Margins Unattached edges (unapproximated) 01/06/21 1527  Drainage Amount Minimal 01/06/21 1527  Drainage Description Purulent 01/06/21 1527  Treatment Debridement (Selective);Hydrotherapy (Pulse lavage);Packing (Saline gauze) 01/06/21 1527       Hydrotherapy Pulsed lavage therapy - wound location: R hip, L hip Pulsed Lavage with Suction (psi): 4 psi Pulsed Lavage with Suction - Normal Saline Used: 2000 mL Pulsed Lavage Tip: Tip with splash shield Selective Debridement Selective Debridement - Location: R hip, L hip Selective Debridement - Tools Used: Forceps, Scalpel, Scissors Selective Debridement - Tissue Removed: Black eschar and yellow unviable tissue    Wound Assessment and Plan  Wound Therapy - Assess/Plan/Recommendations Wound Therapy - Clinical Statement: Pt presents to hydrotherapy with several unstagable pressure injuries. Increased purulent drainage noted with debridement of the R hip wound. This patient will benefit from continued hydrotherapy for selective removal of unviable tissue, to decrease bioburden, and promote wound bed healing. Wound Therapy - Functional Problem List: Decreased tolerance for OOB mobility and position changes; acute pain. Factors Delaying/Impairing Wound Healing: Diabetes Mellitus, Immobility, Multiple medical problems Hydrotherapy Plan: Debridement, Dressing change, Patient/family education, Pulsatile lavage with suction Wound Therapy - Frequency: 6X / week Wound Therapy - Follow Up Recommendations: dressing changes by RN  Wound Therapy Goals- Improve the function of patient's integumentary system by progressing the wound(s) through the phases of wound healing (inflammation - proliferation - remodeling) by: Wound Therapy Goals - Improve the function of patient's integumentary system by progressing the wound(s) through the phases of wound healing by: Decrease Necrotic Tissue to: 20% Decrease Necrotic Tissue - Progress: Goal set today Increase Granulation Tissue to: 80% Increase Granulation Tissue - Progress: Goal set today Goals/treatment plan/discharge plan were made with and agreed upon by patient/family: Yes Time For Goal Achievement: 7 days Wound Therapy - Potential for Goals:  Good  Goals will be updated until maximal potential achieved or discharge criteria met.  Discharge criteria: when goals achieved, discharge from hospital, MD decision/surgical intervention, no progress towards goals, refusal/missing three consecutive treatments without notification or medical reason.  GP     Charges PT Wound Care Charges $Wound Debridement up to 20 cm: < or equal to 20 cm $ Wound Debridement each add'l 20 sqcm: 9 $PT PLS Gun and Tip: 1 Supply $PT Hydrotherapy Visit: 2 Visits       Thelma Comp 01/06/2021, 3:41 PM  Rolinda Roan, PT, DPT Acute Rehabilitation Services Pager: [phone] Office: [phone]

## 2021-01-06 NOTE — Progress Notes (Signed)
PROGRESS NOTE  Kathryn Hancock INO:[id number] DOB: 1951-04-16 DOA: 01/01/2021 PCP: Gala Lewandowsky, MD  HPI/ Kathryn Hancock is a 70 y.o. 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 Doren Custard vascular surgeon on December 28, 2020.  Patient has been feeling persistently nauseous unable to take in and was referred to the ER.  In addition patient's sister felt that the patient's left foot has become more swollen and has noticed some discharge.  Patient also developed right-sided periorbital hematoma over the last 1 week.  This was related to the coughing/retching. In the ER x-rays reveal foreign body of the left foot, and labs showed leukocytosis with WBC count of 19,000.  Patient also was hypoglycemic in the ER.  CT head followed by CT maxillofacial and CT abdomen pelvis was done which shows preseptal hematoma of the right orbit and nothing acute in the abdomen.  Patient had been empirically started on antibiotics for the left foot possible infection.  Admitted for further management.  COVID test was negative.    Subjective  Reported appetite has improved, denies any chest pain or shortness of breath.    Assessment/Plan: Principal Problem:   Intractable nausea and vomiting Active Problems:   Left atrial myxoma   AAA (abdominal aortic aneurysm) without rupture (HCC)   ESRD (end stage renal disease) on dialysis (HCC)   PAF (paroxysmal atrial fibrillation) (HCC) -  CHA2DS2-VASc Score 5 (Age, Female, HTN, DM, Vascular)- On Eliquis   Anemia of chronic disease   DM (diabetes mellitus), type 2 with renal complications (HCC)   S/P CABG (coronary artery bypass graft)     Possible cellulitis/early osteomyelitis of left foot/ Known history  of L gangrenous toes/ Recent R foot transmetatarsal amputation -she is currently afebrile, leukocytosis has resolved -Follow-up blood cultures, so far no growth to date - CT left foot with possible early osteomyelitis, no organized abscess noted - Vascular surgery consulted-no further recommendation, will not pursue any further amputation, she was empirically on IV vancomycin and cefepime, I have discussed with Dr. Scot Dock, she can be transitioned to oral antibiotic, where he can follow with her in 2 weeks as an outpatient, so I will change to doxycycline and Keflex.  Intractable nausea Etiology unclear, ?Unlikely uremia, possibly infection Currently afebrile, with leukocytosis CT abdomen pelvis unremarkable Seems to be improving.  ESRD on HD M/W/F Nephrology consulted,   Preseptal hematoma of the right eye/right subconjunctival hemorrhage Secondary to fall and facial trauma Currently on [facility], will need it as she has history of embolic episodes Denies any worsening vision today Ophthalmology input greatly appreciated.  Calciphylaxis with significant wounds, right thigh wound Sacral decubitus ulcer -Wound care consulted.  plan for hydrotherapy, wound care with Santyl.  Paroxysmal A. Fib Heart rate controlled Continue amiodarone, restart Eliquis on 01/04/21, d/c heparin due to oozing and blood draws Monitor for worsening bleeding Telemetry  Diabetes mellitus type 2 Last A1c 5 SSI, Accu-Cheks, hypoglycemic protocol  Anemia of chronic kidney disease Hemoglobin baseline 9-10 Anemia panel showed iron 28 recieved 1 unit PRBC transfusion 7/28.  Goals of care discussion/Failure to thrive Palliative team consulted due to multiple comorbidities, poor prognosis, extremely poor life quality, with amputations, gangrene, calciphylaxis, pressure ulcers, dialysis dependent, failure to thrive, palliative have discussed  with the family and patient, plan to continue with full measures currently.    Estimated body mass index is 33.86 kg/m as calculated from the following:   Height as of 12/28/20: 5\' 3"  (1.6 m).   Weight as of this encounter: 86.7 kg.     Code Status: Full  Family Communication: None at bedside   Disposition Plan: Status is: Inpatient  The patient will require care spanning > 2 midnights and should be moved to inpatient because: Inpatient level of care appropriate due to severity of illness  Dispo: The patient is from: SNF              Anticipated d/c is to: SNF              Patient currently is not medically stable to d/c.   Difficult to place patient No     Consultants: Vascular surgery Nephrology Palliaitve Neurology  Procedures: None  Antimicrobials: Vancomycin Cefepime  DVT prophylaxis: Eliquis   Objective: Vitals:   01/06/21 1000 01/06/21 1030 01/06/21 1100 01/06/21 1130  BP: 133/61 (!) 144/57 137/61 120/66  Pulse: 61 60 61 63  Resp: 13 12 14 14   Temp:      TempSrc:      SpO2:      Weight:        Intake/Output Summary (Last 24 hours) at 01/06/2021 1337 Last data filed at 01/06/2021 0800 Gross per 24 hour  Intake 780 ml  Output --  Net 780 ml   Filed Weights   01/05/21 1300 01/06/21 0856  Weight: 86.7 kg 86.7 kg    Exam:  Awake Alert, Oriented X 3, frail, chronically ill-appearing Symmetrical Chest wall movement, Good air movement bilaterally, CTAB RRR,No Gallops,Rubs or new Murmurs, No Parasternal Heave +ve B.Sounds, Abd Soft, No tenderness, No rebound - guarding or rigidity. Multiple bruises/ecchymosis bilateral upper extremity, patient with sacral pressure ulcer, calciphylaxis wound on right upper thigh, bandaged, left foot dry gangrene, right foot  with swelling/staples       Data Reviewed: CBC: Recent Labs  Lab 01/01/21 1434 01/02/21 0500 01/03/21 0414 01/04/21 0302 01/05/21 0338 01/06/21 0338  WBC 19.4* 13.1* 11.8* 9.9 8.9 8.9  NEUTROABS 17.3*  --  9.9* 8.2* 7.2 6.7  HGB 10.6* 8.3* 8.5* 8.3* 7.4*  9.1*  HCT 33.1* 26.1* 26.5* 26.4* 24.3* 28.0*  MCV 99.1 99.6 99.6 99.6 101.7* 97.6  PLT 292 201 179 146* 127* 130*   Basic Metabolic Panel: Recent Labs  Lab 01/02/21 0500 01/03/21 0415 01/04/21 0302 01/05/21 0338 01/06/21 0338  NA 128* 131* 135 134* 135  K 4.7 5.5* 3.7 3.4* 3.5  CL 88* 91* 97* 98 98  CO2 19* 19* 23 22 25   GLUCOSE 106* 78 115* 98 84  BUN 80* 86* 35* 16 25*  CREATININE 8.41* 9.20* 5.41* 3.50* 4.88*  CALCIUM 8.2* 8.5* 7.9* 8.0* 8.4*  PHOS  --  6.8* 4.7* 4.1 4.2   GFR: Estimated Creatinine Clearance: 11.7 mL/min (A) (by C-G formula based on SCr of 4.88 mg/dL (H)). Liver Function Tests: Recent Labs  Lab 01/01/21 1434 01/02/21 0500 01/03/21 0415 01/04/21 0302 01/05/21 0338 01/06/21 0338  AST 13* 11*  --   --   --   --   ALT 10 9  --   --   --   --   ALKPHOS 117 84  --   --   --   --   BILITOT 0.8 0.9  --   --   --   --  PROT 6.1* 4.5*  --   --   --   --   ALBUMIN 2.5* 1.9* 1.9* 1.8* 1.8* 1.7*   Recent Labs  Lab 01/01/21 1434  LIPASE 29   No results for input(s): AMMONIA in the last 168 hours. Coagulation Profile: Recent Labs  Lab 01/01/21 1842  INR 1.8*   Cardiac Enzymes: No results for input(s): CKTOTAL, CKMB, CKMBINDEX, TROPONINI in the last 168 hours. BNP (last 3 results) No results for input(s): PROBNP in the last 8760 hours. HbA1C: No results for input(s): HGBA1C in the last 72 hours. CBG: Recent Labs  Lab 01/05/21 0638 01/05/21 1134 01/05/21 1648 01/05/21 2013 01/06/21 0550  GLUCAP 79 139* 155* 132* 76   Lipid Profile: No results for input(s): CHOL, HDL, LDLCALC, TRIG, CHOLHDL, LDLDIRECT in the last 72 hours. Thyroid Function Tests: No results for input(s): TSH, T4TOTAL, FREET4, T3FREE, THYROIDAB in the last 72 hours. Anemia Panel: No results for input(s): VITAMINB12, FOLATE, FERRITIN, TIBC, IRON, RETICCTPCT in the last 72 hours.  Urine analysis:    Component Value Date/Time   COLORURINE AMBER (A) 09/30/2020 1035    APPEARANCEUR CLOUDY (A) 09/30/2020 1035   LABSPEC 1.012 09/30/2020 1035   PHURINE 8.0 09/30/2020 1035   GLUCOSEU NEGATIVE 09/30/2020 1035   HGBUR NEGATIVE 09/30/2020 1035   BILIRUBINUR NEGATIVE 09/30/2020 1035   KETONESUR NEGATIVE 09/30/2020 1035   PROTEINUR 30 (A) 09/30/2020 1035   NITRITE NEGATIVE 09/30/2020 1035   LEUKOCYTESUR LARGE (A) 09/30/2020 1035   Sepsis Labs: @LABRCNTIP (procalcitonin:4,lacticidven:4)  ) Recent Results (from the past 240 hour(s))  Resp Panel by RT-PCR (Flu A&B, Covid) Nasopharyngeal Swab     Status: None   Collection Time: 01/01/21  7:28 PM   Specimen: Nasopharyngeal Swab; Nasopharyngeal(NP) swabs in vial transport medium  Result Value Ref Range Status   SARS Coronavirus 2 by RT PCR NEGATIVE NEGATIVE Final    Comment: (NOTE) SARS-CoV-2 target nucleic acids are NOT DETECTED.  The SARS-CoV-2 RNA is generally detectable in upper respiratory specimens during the acute phase of infection. The lowest concentration of SARS-CoV-2 viral copies this assay can detect is 138 copies/mL. A negative result does not preclude SARS-Cov-2 infection and should not be used as the sole basis for treatment or other patient management decisions. A negative result may occur with  improper specimen collection/handling, submission of specimen other than nasopharyngeal swab, presence of viral mutation(s) within the areas targeted by this assay, and inadequate number of viral copies(<138 copies/mL). A negative result must be combined with clinical observations, patient history, and epidemiological information. The expected result is Negative.  Fact Sheet for Patients:  EntrepreneurPulse.com.au  Fact Sheet for Healthcare Providers:  IncredibleEmployment.be  This test is no t yet approved or cleared by the [place] FDA and  has been authorized for detection and/or diagnosis of SARS-CoV-2 by FDA under an Emergency Use Authorization  (EUA). This EUA will remain  in effect (meaning this test can be used) for the duration of the COVID-19 declaration under Section 564(b)(1) of the Act, 21 U.S.C.section 360bbb-3(b)(1), unless the authorization is terminated  or revoked sooner.       Influenza A by PCR NEGATIVE NEGATIVE Final   Influenza B by PCR NEGATIVE NEGATIVE Final    Comment: (NOTE) The Xpert Xpress SARS-CoV-2/FLU/RSV plus assay is intended as an aid in the diagnosis of influenza from Nasopharyngeal swab specimens and should not be used as a sole basis for treatment. Nasal washings and aspirates are unacceptable for Xpert Xpress SARS-CoV-2/FLU/RSV testing.  Fact  Sheet for Patients: EntrepreneurPulse.com.au  Fact Sheet for Healthcare Providers: IncredibleEmployment.be  This test is not yet approved or cleared by the [place] FDA and has been authorized for detection and/or diagnosis of SARS-CoV-2 by FDA under an Emergency Use Authorization (EUA). This EUA will remain in effect (meaning this test can be used) for the duration of the COVID-19 declaration under Section 564(b)(1) of the Act, 21 U.S.C. section 360bbb-3(b)(1), unless the authorization is terminated or revoked.  Performed at [facility], [place] [address], [place], [place] [address]   Blood culture (routine x 2)     Status: None (Preliminary result)   Collection Time: 01/01/21  8:08 PM   Specimen: BLOOD  Result Value Ref Range Status   Specimen Description BLOOD RIGHT ANTECUBITAL  Final   Special Requests   Final    BOTTLES DRAWN AEROBIC AND ANAEROBIC Blood Culture adequate volume   Culture   Final    NO GROWTH 3 DAYS Performed at [facility], [place] [address] [place], [place] [address]    Report Status PENDING  Incomplete      Studies: No results found.  Scheduled Meds:  amiodarone  200 mg Oral QPM   apixaban  5 mg Oral BID   artificial tears  1 application Right Eye L3Y while  awake   aspirin EC  81 mg Oral QPM   atorvastatin  20 mg Oral QPM   Chlorhexidine Gluconate Cloth  6 each Topical Q0600   collagenase   Topical Daily   darbepoetin (ARANESP) injection - DIALYSIS  100 mcg Intravenous Q Fri-HD   gabapentin  100 mg Oral QHS   heparin sodium (porcine)  4,000 Units Intravenous Once   sevelamer carbonate  800 mg Oral TID WC    Continuous Infusions:  ceFEPime (MAXIPIME) IV 1 g (01/05/21 2020)   vancomycin Stopped (01/06/21 1330)     LOS: 4 days     Phillips Climes, MD [facility]  If 7PM-7AM, please contact night-coverage [email or web address] 01/06/2021, 1:37 PM

## 2021-01-07 DIAGNOSIS — R112 Nausea with vomiting, unspecified: Secondary | ICD-10-CM | POA: Diagnosis not present

## 2021-01-07 DIAGNOSIS — I96 Gangrene, not elsewhere classified: Secondary | ICD-10-CM | POA: Diagnosis not present

## 2021-01-07 DIAGNOSIS — T148XXA Other injury of unspecified body region, initial encounter: Secondary | ICD-10-CM | POA: Diagnosis not present

## 2021-01-07 DIAGNOSIS — D638 Anemia in other chronic diseases classified elsewhere: Secondary | ICD-10-CM | POA: Diagnosis not present

## 2021-01-07 LAB — CBC WITH DIFFERENTIAL/PLATELET
Abs Immature Granulocytes: 0.13 10*3/uL — ABNORMAL HIGH (ref 0.00–0.07)
Basophils Absolute: 0 10*3/uL (ref 0.0–0.1)
Basophils Relative: 0 %
Eosinophils Absolute: 0.2 10*3/uL (ref 0.0–0.5)
Eosinophils Relative: 2 %
HCT: 31.2 % — ABNORMAL LOW (ref 36.0–46.0)
Hemoglobin: 9.7 g/dL — ABNORMAL LOW (ref 12.0–15.0)
Immature Granulocytes: 1 %
Lymphocytes Relative: 11 %
Lymphs Abs: 1.1 10*3/uL (ref 0.7–4.0)
MCH: 31 pg (ref 26.0–34.0)
MCHC: 31.1 g/dL (ref 30.0–36.0)
MCV: 99.7 fL (ref 80.0–100.0)
Monocytes Absolute: 0.8 10*3/uL (ref 0.1–1.0)
Monocytes Relative: 7 %
Neutro Abs: 8.5 10*3/uL — ABNORMAL HIGH (ref 1.7–7.7)
Neutrophils Relative %: 79 %
Platelets: 133 10*3/uL — ABNORMAL LOW (ref 150–400)
RBC: 3.13 MIL/uL — ABNORMAL LOW (ref 3.87–5.11)
RDW: 16.8 % — ABNORMAL HIGH (ref 11.5–15.5)
WBC: 10.8 10*3/uL — ABNORMAL HIGH (ref 4.0–10.5)
nRBC: 0 % (ref 0.0–0.2)

## 2021-01-07 LAB — RENAL FUNCTION PANEL
Albumin: 1.9 g/dL — ABNORMAL LOW (ref 3.5–5.0)
Anion gap: 10 (ref 5–15)
BUN: 15 mg/dL (ref 8–23)
CO2: 26 mmol/L (ref 22–32)
Calcium: 7.9 mg/dL — ABNORMAL LOW (ref 8.9–10.3)
Chloride: 97 mmol/L — ABNORMAL LOW (ref 98–111)
Creatinine, Ser: 3.64 mg/dL — ABNORMAL HIGH (ref 0.44–1.00)
GFR, Estimated: 13 mL/min — ABNORMAL LOW (ref >60)
Glucose, Bld: 189 mg/dL — ABNORMAL HIGH (ref 70–99)
Phosphorus: 1.9 mg/dL — ABNORMAL LOW (ref 2.5–4.6)
Potassium: 3.6 mmol/L (ref 3.5–5.1)
Sodium: 133 mmol/L — ABNORMAL LOW (ref 135–145)

## 2021-01-07 LAB — GLUCOSE, CAPILLARY
Glucose-Capillary: 120 mg/dL — ABNORMAL HIGH (ref 70–99)
Glucose-Capillary: 121 mg/dL — ABNORMAL HIGH (ref 70–99)
Glucose-Capillary: 122 mg/dL — ABNORMAL HIGH (ref 70–99)
Glucose-Capillary: 143 mg/dL — ABNORMAL HIGH (ref 70–99)
Glucose-Capillary: 150 mg/dL — ABNORMAL HIGH (ref 70–99)
Glucose-Capillary: 151 mg/dL — ABNORMAL HIGH (ref 70–99)
Glucose-Capillary: 220 mg/dL — ABNORMAL HIGH (ref 70–99)
Glucose-Capillary: 90 mg/dL (ref 70–99)

## 2021-01-07 LAB — CULTURE, BLOOD (ROUTINE X 2)
Culture: NO GROWTH
Special Requests: ADEQUATE

## 2021-01-07 NOTE — Progress Notes (Signed)
[facility] Progress Note  Subjective: seen in room, no c/o  Vitals:   01/06/21 1340 01/06/21 2000 01/07/21 0436 01/07/21 1200  BP: 130/74 (!) 143/62 (!) 148/61 136/74  Pulse: 65 74 63 64  Resp: 18 18 18 19   Temp: 98 F (36.7 C) 98.9 F (37.2 C) 98.2 F (36.8 C) 98 F (36.7 C)  TempSrc:  Oral Oral Axillary  SpO2: 100% 100% 97% 99%  Weight:        Exam: General: Chronically ill appearing,nad  Neck: Supple. No JVD Lungs: CTA bilaterally without wheezes, rales, or rhonchi. Breathing is unlabored. Heart: RRR with S1 S2 Abdomen: soft non-tender  Ext: 1-2+ pitting LE edema to thighs R TMA; L foot w/ gangrenous toes Neuro: A & O  X 3. Moves all extremities spontaneously Dialysis Access: R IJ TDC; LUE AVF    OP HD: [facility] MWF   3h 62min  250/500   2/2 bath  59.5kg  TDC + AVF (using intermittently)  Hep none  - mircera 100 q2 last 7/13, due 7/27  - Na thio 25 q HD  Assessment/ Plan: L foot gangrene/recent R TMA -- Early osteo on CT, on IV cefepime. VVS consulted, no plans for further amputation given poor overall prognosis.  ESRD -  HD MWF. HD Monday.  Wounds - has multiple wounds, getting hydrotherapy, pain control.  These are calciphylaxis wounds and are usually very painful.  BP/volume- sig hip edema, 3kg up 2 days ago.  2.5 L UF goal on HD today. Wt's are off.  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  Anemia  - Hgb 8.3. ESA due on 7/27, darbe 100ug weekly on Fri ordered Metabolic bone disease -  No Ca/Vit D 2/2 suspected calciphylaxis  Nutrition - Renal diet with fluid restriction  Afib - on Eliquis, amiodarone, Coreg   GOC - PCT is working w/ pt and family     Kelly Splinter 01/07/2021, 3:11 PM   Recent Labs  Lab 01/06/21 0338 01/07/21 0220  K 3.5 3.6  BUN 25* 15  CREATININE 4.88* 3.64*  CALCIUM 8.4* 7.9*  PHOS 4.2 1.9*  HGB 9.1* 9.7*     Inpatient medications:  amiodarone  200 mg Oral QPM   apixaban  5 mg Oral BID   artificial tears  1 application Right Eye S8O while awake   aspirin EC  81 mg Oral QPM   atorvastatin  20 mg Oral QPM   cephALEXin  500 mg Oral Q24H   Chlorhexidine Gluconate Cloth  6 each Topical Q0600   collagenase   Topical Daily   darbepoetin (ARANESP) injection - DIALYSIS  100 mcg Intravenous Q Fri-HD   doxycycline  100 mg Oral Q12H   gabapentin  100 mg Oral QHS   sevelamer carbonate  800 mg Oral TID WC    [START ON 01/09/2021] sodium thiosulfate infusion for calciphylaxis     HYDROcodone-acetaminophen, metoprolol tartrate, ondansetron **OR** ondansetron (ZOFRAN) IV, oxyCODONE, prochlorperazine

## 2021-01-07 NOTE — Progress Notes (Addendum)
Physical Therapy Wound Treatment Patient Details  Name: Kathryn Hancock MRN: [id number] Date of Birth: 04/22/1951  Today's Date: 01/07/2021 Time: 1125-1237 Time Calculation (min): 72 min  Subjective  Subjective Assessment Subjective: Pt pleasant and agreeable to therapy. Patient and Family Stated Goals: Heal wounds, decrease pain Date of Onset:  (Unknown) Prior Treatments: Dressing changes  Pain Score:  Premedicated. Some wounds very painful however overall pt tolerated treatment with less pain than yesterday's session where pt was medicated when hydrotherapy arrived.   Wound Assessment  Pressure Injury Flank Left Unstageable - Full thickness tissue loss in which the base of the injury is covered by slough (yellow, tan, gray, green or brown) and/or eschar (tan, brown or black) in the wound bed. (Active)  Dressing Type ABD;Barrier Film (skin prep);Gauze (Comment);Moist to moist;Santyl 01/07/21 1354  Dressing Changed;Clean;Dry;Intact 01/07/21 1354  Dressing Change Frequency Daily 01/07/21 1354  State of Healing Eschar 01/07/21 1354  Site / Wound Assessment Black 01/07/21 1354  % Wound base Red or Granulating 0% 01/07/21 1354  % Wound base Yellow/Fibrinous Exudate 0% 01/07/21 1354  % Wound base Black/Eschar 100% 01/07/21 1354  % Wound base Other/Granulation Tissue (Comment) 0% 01/07/21 1354  Peri-wound Assessment Intact;Pink 01/07/21 1354  Wound Length (cm) 2.5 cm 01/06/21 1400  Wound Width (cm) 9 cm 01/06/21 1400  Wound Depth (cm) 0 cm 01/06/21 1400  Wound Surface Area (cm^2) 22.5 cm^2 01/06/21 1400  Wound Volume (cm^3) 0 cm^3 01/06/21 1400  Tunneling (cm) 0 01/06/21 1527  Undermining (cm) 0 01/06/21 1527  Margins Unattached edges (unapproximated) 01/07/21 1354  Drainage Amount Minimal 01/07/21 1354  Drainage Description Purulent 01/07/21 1354  Treatment Debridement (Selective);Hydrotherapy (Pulse lavage);Packing (Saline gauze) 01/07/21 1354     Pressure Injury 01/03/21 Buttocks Left  Unstageable - Full thickness tissue loss in which the base of the injury is covered by slough (yellow, tan, gray, green or brown) and/or eschar (tan, brown or black) in the wound bed. (Active)  Dressing Type ABD;Barrier Film (skin prep);Gauze (Comment);Moist to moist;Santyl 01/07/21 1354  Dressing Changed;Clean;Dry;Intact 01/07/21 1354  Dressing Change Frequency Daily 01/07/21 1354  State of Healing Early/partial granulation 01/07/21 1354  Site / Wound Assessment Yellow;Red 01/07/21 1354  % Wound base Red or Granulating 20% 01/07/21 1354  % Wound base Yellow/Fibrinous Exudate 75% 01/07/21 1354  % Wound base Black/Eschar 5% 01/07/21 1354  % Wound base Other/Granulation Tissue (Comment) 0% 01/07/21 1354  Peri-wound Assessment Intact;Pink 01/07/21 1354  Wound Length (cm) 6 cm 01/06/21 1400  Wound Width (cm) 6.5 cm 01/06/21 1400  Wound Depth (cm) 0 cm 01/06/21 1400  Wound Surface Area (cm^2) 39 cm^2 01/06/21 1400  Wound Volume (cm^3) 0 cm^3 01/06/21 1400  Tunneling (cm) 0 01/06/21 1527  Undermining (cm) 0 01/06/21 1527  Margins Unattached edges (unapproximated) 01/07/21 1354  Drainage Amount Minimal 01/07/21 1354  Drainage Description Purulent 01/07/21 1354  Treatment Debridement (Selective);Hydrotherapy (Pulse lavage);Packing (Saline gauze) 01/07/21 1354     Pressure Injury 11/28/20 Buttocks Right Unstageable - Full thickness tissue loss in which the base of the injury is covered by slough (yellow, tan, gray, green or brown) and/or eschar (tan, brown or black) in the wound bed. (Active)  Dressing Type ABD;Barrier Film (skin prep);Gauze (Comment);Moist to moist;Santyl 01/07/21 1354  Dressing Changed;Clean;Dry;Intact 01/07/21 1354  Dressing Change Frequency Daily 01/07/21 1354  State of Healing Eschar 01/07/21 1354  Site / Wound Assessment Black;Yellow 01/07/21 1354  % Wound base Red or Granulating 15% 01/07/21 1354  % Wound base Yellow/Fibrinous Exudate 45% 01/06/21   1527  % Wound base  Black/Eschar 40% 01/07/21 1354  % Wound base Other/Granulation Tissue (Comment) 0% 01/07/21 1354  Peri-wound Assessment Intact;Pink 01/07/21 1354  Wound Length (cm) 13 cm 01/06/21 1400  Wound Width (cm) 11 cm 01/06/21 1400  Wound Depth (cm) 0 cm 01/06/21 1400  Wound Surface Area (cm^2) 143 cm^2 01/06/21 1400  Wound Volume (cm^3) 0 cm^3 01/06/21 1400  Tunneling (cm) 0 01/06/21 1527  Undermining (cm) 0 01/06/21 1527  Margins Unattached edges (unapproximated) 01/07/21 1354  Drainage Amount Minimal 01/07/21 1354  Drainage Description Purulent 01/07/21 1354  Treatment Debridement (Selective);Hydrotherapy (Pulse lavage);Packing (Saline gauze) 01/07/21 1354     Pressure Injury 01/03/21 Hip Left Unstageable - Full thickness tissue loss in which the base of the injury is covered by slough (yellow, tan, gray, green or brown) and/or eschar (tan, brown or black) in the wound bed. (Active)  Dressing Type ABD;Barrier Film (skin prep);Gauze (Comment);Moist to moist;Santyl 01/07/21 1354  Dressing Changed;Clean;Dry;Intact 01/07/21 1354  Dressing Change Frequency Daily 01/07/21 1354  State of Healing Eschar 01/07/21 1354  Site / Wound Assessment Black 01/07/21 1354  % Wound base Red or Granulating 0% 01/07/21 1354  % Wound base Yellow/Fibrinous Exudate 0% 01/07/21 1354  % Wound base Black/Eschar 100% 01/07/21 1354  % Wound base Other/Granulation Tissue (Comment) 0% 01/07/21 1354  Peri-wound Assessment Intact;Pink 01/07/21 1354  Wound Length (cm) 1.8 cm 01/06/21 1400  Wound Width (cm) 2.5 cm 01/06/21 1400  Wound Depth (cm) 0 cm 01/06/21 1400  Wound Surface Area (cm^2) 4.5 cm^2 01/06/21 1400  Wound Volume (cm^3) 0 cm^3 01/06/21 1400  Tunneling (cm) 0 01/06/21 1527  Undermining (cm) 0 01/06/21 1527  Margins Unattached edges (unapproximated) 01/07/21 1354  Drainage Amount Minimal 01/07/21 1354  Drainage Description Purulent 01/07/21 1354  Treatment Debridement (Selective);Hydrotherapy (Pulse  lavage);Packing (Saline gauze) 01/07/21 1354   Hydrotherapy Pulsed lavage therapy - wound location: R hip, L hip Pulsed Lavage with Suction (psi): 4 psi Pulsed Lavage with Suction - Normal Saline Used: 2000 mL Pulsed Lavage Tip: Tip with splash shield Selective Debridement Selective Debridement - Location: R hip, L hip Selective Debridement - Tools Used: Forceps, Scalpel, Scissors Selective Debridement - Tissue Removed: Black eschar and yellow unviable tissue    Wound Assessment and Plan  Wound Therapy - Assess/Plan/Recommendations Wound Therapy - Clinical Statement: Thick slough where eschar was removed last session. Pt was very painful and debridement was limited. This patient will benefit from continued hydrotherapy for selective removal of unviable tissue, to decrease bioburden, and promote wound bed healing. Wound Therapy - Functional Problem List: Decreased tolerance for OOB mobility and position changes; acute pain. Factors Delaying/Impairing Wound Healing: Diabetes Mellitus, Immobility, Multiple medical problems Hydrotherapy Plan: Debridement, Dressing change, Patient/family education, Pulsatile lavage with suction Wound Therapy - Frequency: 6X / week Wound Therapy - Follow Up Recommendations: dressing changes by RN  Wound Therapy Goals- Improve the function of patient's integumentary system by progressing the wound(s) through the phases of wound healing (inflammation - proliferation - remodeling) by: Wound Therapy Goals - Improve the function of patient's integumentary system by progressing the wound(s) through the phases of wound healing by: Decrease Necrotic Tissue to: 20% Decrease Necrotic Tissue - Progress: Progressing toward goal Increase Granulation Tissue to: 80% Increase Granulation Tissue - Progress: Progressing toward goal Goals/treatment plan/discharge plan were made with and agreed upon by patient/family: Yes Time For Goal Achievement: 7 days Wound Therapy -  Potential for Goals: Good  Goals will be updated until maximal potential achieved  or discharge criteria met.  Discharge criteria: when goals achieved, discharge from hospital, MD decision/surgical intervention, no progress towards goals, refusal/missing three consecutive treatments without notification or medical reason.  GP     Charges PT Wound Care Charges $Wound Debridement up to 20 cm: < or equal to 20 cm $ Wound Debridement each add'l 20 sqcm: 9 $PT PLS Gun and Tip: 1 Supply $PT Hydrotherapy Visit: 2 Visits        D  01/07/2021, 2:08 PM   , PT, DPT Acute Rehabilitation Services Pager: [phone] Office: [phone]   

## 2021-01-07 NOTE — Plan of Care (Signed)
  Problem: Education: Goal: Knowledge of disease and its progression will improve Outcome: Progressing Goal: Individualized Educational Video(s) Outcome: Progressing   Problem: Fluid Volume: Goal: Compliance with measures to maintain balanced fluid volume will improve Outcome: Progressing   Problem: Health Behavior/Discharge Planning: Goal: Ability to manage health-related needs will improve Outcome: Progressing   Problem: Nutritional: Goal: Ability to make healthy dietary choices will improve Outcome: Progressing   Problem: Clinical Measurements: Goal: Complications related to the disease process, condition or treatment will be avoided or minimized Outcome: Progressing   Problem: Education: Goal: Knowledge of General Education information will improve Description: Including pain rating scale, medication(s)/side effects and non-pharmacologic comfort measures Outcome: Progressing   Problem: Health Behavior/Discharge Planning: Goal: Ability to manage health-related needs will improve Outcome: Progressing   Problem: Activity: Goal: Risk for activity intolerance will decrease Outcome: Progressing   Problem: Nutrition: Goal: Adequate nutrition will be maintained Outcome: Progressing   Problem: Coping: Goal: Level of anxiety will decrease Outcome: Progressing   Problem: Elimination: Goal: Will not experience complications related to bowel motility Outcome: Progressing Goal: Will not experience complications related to urinary retention Outcome: Progressing   Problem: Pain Managment: Goal: General experience of comfort will improve Outcome: Progressing

## 2021-01-07 NOTE — Progress Notes (Signed)
PROGRESS NOTE  [name]:[id number] DOB: Oct 18, 1950 DOA: 01/01/2021 PCP: Gala Lewandowsky, MD  HPI/ Kathryn Hancock is a 70 y.o. 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 Doren Custard vascular surgeon on December 28, 2020.  Patient has been feeling persistently nauseous unable to take in and was referred to the ER.  In addition patient's sister felt that the patient's left foot has become more swollen and has noticed some discharge.  Patient also developed right-sided periorbital hematoma over the last 1 week.  This was related to the coughing/retching. In the ER x-rays reveal foreign body of the left foot, and labs showed leukocytosis with WBC count of 19,000.  Patient also was hypoglycemic in the ER.  CT head followed by CT maxillofacial and CT abdomen pelvis was done which shows preseptal hematoma of the right orbit and nothing acute in the abdomen.  Patient had been empirically started on antibiotics for the left foot possible infection.  Admitted for further management.  COVID test was negative.    Subjective  She denies any complaints today, reports her pain is controlled, no chest pain, no shortness of breath.   Assessment/Plan: Principal Problem:   Intractable nausea and vomiting Active Problems:   Left atrial myxoma   AAA (abdominal aortic aneurysm) without rupture (HCC)   ESRD (end stage renal disease) on dialysis (HCC)   PAF (paroxysmal atrial fibrillation) (HCC) -  CHA2DS2-VASc Score 5 (Age, Female, HTN, DM, Vascular)- On Eliquis   Anemia of chronic disease   DM (diabetes mellitus), type 2 with renal complications (HCC)   S/P CABG (coronary artery bypass graft)     Possible cellulitis/early osteomyelitis of  left foot/ Known history of L gangrenous toes/ Recent R foot transmetatarsal amputation -she is currently afebrile, leukocytosis has resolved -Follow-up blood cultures, so far no growth to date - CT left foot with possible early osteomyelitis, no organized abscess noted - Vascular surgery consulted-no further recommendation, will not pursue any further amputation, she was empirically on IV vancomycin and cefepime, I have discussed with Dr. Scot Dock, she can be transitioned to oral antibiotic, where he can follow with her in 2 weeks as an outpatient, changed to doxycycline and Keflex.  Intractable nausea afebrile, with leukocytosis CT abdomen pelvis unremarkable improved  ESRD on HD M/W/F Nephrology consulted.  Preseptal hematoma of the right eye/right subconjunctival hemorrhage Secondary to fall and facial trauma Currently on [facility], will need it as she has history of embolic episodes Denies any worsening vision today Ophthalmology input greatly appreciated.  Paroxysmal A. Fib Heart rate controlled Continue amiodarone, restart Eliquis on 01/04/21 Monitor for worsening bleeding Telemetry  Diabetes mellitus type 2 Last A1c 5 SSI, Accu-Cheks, hypoglycemic protocol  Anemia of chronic kidney disease Hemoglobin baseline 9-10 Anemia panel showed iron 28, ferritin elevated at 710. recieved 1 unit PRBC transfusion 7/28.  Goals of care discussion/Failure to thrive Palliative team consulted due to multiple comorbidities, poor prognosis, extremely poor life quality, with amputations, gangrene, calciphylaxis, pressure ulcers, dialysis dependent, failure to thrive, palliative have discussed with the family and patient, plan to continue with full measures currently.    Multiple pressure ulcers/calciphylaxis wound -Wound care consulted, patient currently with hydrotherapy, likely will need hydrotherapy for next 48 hours, likely by  then her eschar will be off.  Continue with local wound  care. Pressure Injury 01/03/21 Coccyx Medial Stage 3 -  Full thickness tissue loss. Subcutaneous fat may be visible but bone, tendon or muscle are NOT exposed. 2 cm x 1 cm shallow bed with yellow/white (Active)  01/03/21   Location: Coccyx  Location Orientation: Medial  Staging: Stage 3 -  Full thickness tissue loss. Subcutaneous fat may be visible but bone, tendon or muscle are NOT exposed.  Wound Description (Comments): 2 cm x 1 cm shallow bed with yellow/white  Present on Admission: Yes     Pressure Injury 10/31/20 Buttocks Right;Upper Unstageable - Full thickness tissue loss in which the base of the injury is covered by slough (yellow, tan, gray, green or brown) and/or eschar (tan, brown or black) in the wound bed. 2 cm x 1.5 cm eschar (Active)  10/31/20 1500  Location: Buttocks  Location Orientation: Right;Upper  Staging: Unstageable - Full thickness tissue loss in which the base of the injury is covered by slough (yellow, tan, gray, green or brown) and/or eschar (tan, brown or black) in the wound bed.  Wound Description (Comments): 2 cm x 1.5 cm eschar  Present on Admission: Yes     Pressure Injury 10/31/20 Buttocks Right;Lower Unstageable - Full thickness tissue loss in which the base of the injury is covered by slough (yellow, tan, gray, green or brown) and/or eschar (tan, brown or black) in the wound bed. 1 cm x 1 cm eschar (Active)  10/31/20 1500  Location: Buttocks  Location Orientation: Right;Lower  Staging: Unstageable - Full thickness tissue loss in which the base of the injury is covered by slough (yellow, tan, gray, green or brown) and/or eschar (tan, brown or black) in the wound bed.  Wound Description (Comments): 1 cm x 1 cm eschar  Present on Admission: Yes     Pressure Injury 01/03/21 Heel Posterior;Right Unstageable - Full thickness tissue loss in which the base of the injury is covered by slough (yellow, tan, gray, green or brown) and/or eschar (tan, brown or black) in  the wound bed. eschar (Active)  01/03/21   Location: Heel  Location Orientation: Posterior;Right  Staging: Unstageable - Full thickness tissue loss in which the base of the injury is covered by slough (yellow, tan, gray, green or brown) and/or eschar (tan, brown or black) in the wound bed.  Wound Description (Comments): eschar  Present on Admission: Yes     Pressure Injury 01/03/21 Thigh Right;Medial Stage 3 -  Full thickness tissue loss. Subcutaneous fat may be visible but bone, tendon or muscle are NOT exposed. (Active)  01/03/21   Location: Thigh  Location Orientation: Right;Medial  Staging: Stage 3 -  Full thickness tissue loss. Subcutaneous fat may be visible but bone, tendon or muscle are NOT exposed.  Wound Description (Comments):   Present on Admission: Yes     Pressure Injury Flank Left Unstageable - Full thickness tissue loss in which the base of the injury is covered by slough (yellow, tan, gray, green or brown) and/or eschar (tan, brown or black) in the wound bed. (Active)     Location: Flank  Location Orientation: Left  Staging: Unstageable - Full thickness tissue loss in which the base of the injury is covered by slough (yellow, tan, gray, green or brown) and/or eschar (tan, brown or black) in the wound bed.  Wound Description (Comments):   Present on Admission: Yes     Pressure Injury 01/03/21 Buttocks Left Unstageable -  Full thickness tissue loss in which the base of the injury is covered by slough (yellow, tan, gray, green or brown) and/or eschar (tan, brown or black) in the wound bed. (Active)  01/03/21 0826  Location: Buttocks  Location Orientation: Left  Staging: Unstageable - Full thickness tissue loss in which the base of the injury is covered by slough (yellow, tan, gray, green or brown) and/or eschar (tan, brown or black) in the wound bed.  Wound Description (Comments):   Present on Admission: Yes     Pressure Injury 11/28/20 Buttocks Right Unstageable - Full  thickness tissue loss in which the base of the injury is covered by slough (yellow, tan, gray, green or brown) and/or eschar (tan, brown or black) in the wound bed. (Active)  11/28/20 0826  Location: Buttocks  Location Orientation: Right  Staging: Unstageable - Full thickness tissue loss in which the base of the injury is covered by slough (yellow, tan, gray, green or brown) and/or eschar (tan, brown or black) in the wound bed.  Wound Description (Comments):   Present on Admission: Yes     Pressure Injury 01/03/21 Hip Left Unstageable - Full thickness tissue loss in which the base of the injury is covered by slough (yellow, tan, gray, green or brown) and/or eschar (tan, brown or black) in the wound bed. (Active)  01/03/21   Location: Hip  Location Orientation: Left  Staging: Unstageable - Full thickness tissue loss in which the base of the injury is covered by slough (yellow, tan, gray, green or brown) and/or eschar (tan, brown or black) in the wound bed.  Wound Description (Comments):   Present on Admission: Yes        Estimated body mass index is 32.73 kg/m as calculated from the following:   Height as of 12/28/20: 5\' 3"  (1.6 m).   Weight as of this encounter: 83.8 kg.     Code Status: Full  Family Communication: discussed with daughter and grandson at bedside  Disposition Plan: Status is: Inpatient  The patient will require care spanning > 2 midnights and should be moved to inpatient because: Inpatient level of care appropriate due to severity of illness  Dispo: The patient is from: SNF              Anticipated d/c is to: SNF              Patient currently is not medically stable to d/c.   Difficult to place patient No     Consultants: Vascular surgery Nephrology Palliaitve Neurology  Procedures: None  Antimicrobials: Vancomycin Cefepime  DVT prophylaxis: Eliquis   Objective: Vitals:   01/06/21 1241 01/06/21 1340 01/06/21 2000 01/07/21 0436  BP: 129/61  130/74 (!) 143/62 (!) 148/61  Pulse: 64 65 74 63  Resp: 14 18 18 18   Temp:  98 F (36.7 C) 98.9 F (37.2 C) 98.2 F (36.8 C)  TempSrc:   Oral Oral  SpO2: 100% 100% 100% 97%  Weight: 83.8 kg       Intake/Output Summary (Last 24 hours) at 01/07/2021 1153 Last data filed at 01/07/2021 0838 Gross per 24 hour  Intake 858.32 ml  Output 2915 ml  Net -2056.68 ml   Filed Weights   01/05/21 1300 01/06/21 0856 01/06/21 1241  Weight: 86.7 kg 86.7 kg 83.8 kg    Exam:  Awake Alert, Oriented X 3, N frail, chronically ill-appearing Symmetrical Chest wall movement, Good air movement bilaterally, CTAB RRR,No Gallops,Rubs or new Murmurs, No Parasternal Heave +ve  B.Sounds, Abd Soft, No tenderness, No rebound - guarding or rigidity. Multiple bruises/ecchymosis bilateral upper extremity, and with multiple pressure ulcers in the buttocks area, wounds in the right upper thigh area, bandaged, left foot dry gangrene, right foot  with swelling/staples       Data Reviewed: CBC: Recent Labs  Lab 01/03/21 0414 01/04/21 0302 01/05/21 0338 01/06/21 0338 01/07/21 0220  WBC 11.8* 9.9 8.9 8.9 10.8*  NEUTROABS 9.9* 8.2* 7.2 6.7 8.5*  HGB 8.5* 8.3* 7.4* 9.1* 9.7*  HCT 26.5* 26.4* 24.3* 28.0* 31.2*  MCV 99.6 99.6 101.7* 97.6 99.7  PLT 179 146* 127* 111* 637*   Basic Metabolic Panel: Recent Labs  Lab 01/03/21 0415 01/04/21 0302 01/05/21 0338 01/06/21 0338 01/07/21 0220  NA 131* 135 134* 135 133*  K 5.5* 3.7 3.4* 3.5 3.6  CL 91* 97* 98 98 97*  CO2 19* 23 22 25 26   GLUCOSE 78 115* 98 84 189*  BUN 86* 35* 16 25* 15  CREATININE 9.20* 5.41* 3.50* 4.88* 3.64*  CALCIUM 8.5* 7.9* 8.0* 8.4* 7.9*  PHOS 6.8* 4.7* 4.1 4.2 1.9*   GFR: Estimated Creatinine Clearance: 15.4 mL/min (A) (by C-G formula based on SCr of 3.64 mg/dL (H)). Liver Function Tests: Recent Labs  Lab 01/01/21 1434 01/02/21 0500 01/03/21 0415 01/04/21 0302 01/05/21 0338 01/06/21 0338 01/07/21 0220  AST 13* 11*  --   --    --   --   --   ALT 10 9  --   --   --   --   --   ALKPHOS 117 84  --   --   --   --   --   BILITOT 0.8 0.9  --   --   --   --   --   PROT 6.1* 4.5*  --   --   --   --   --   ALBUMIN 2.5* 1.9* 1.9* 1.8* 1.8* 1.7* 1.9*   Recent Labs  Lab 01/01/21 1434  LIPASE 29   No results for input(s): AMMONIA in the last 168 hours. Coagulation Profile: Recent Labs  Lab 01/01/21 1842  INR 1.8*   Cardiac Enzymes: No results for input(s): CKTOTAL, CKMB, CKMBINDEX, TROPONINI in the last 168 hours. BNP (last 3 results) No results for input(s): PROBNP in the last 8760 hours. HbA1C: No results for input(s): HGBA1C in the last 72 hours. CBG: Recent Labs  Lab 01/06/21 2041 01/06/21 2346 01/07/21 0002 01/07/21 0448 01/07/21 0759  GLUCAP 143* 151* 122* 150* 90   Lipid Profile: No results for input(s): CHOL, HDL, LDLCALC, TRIG, CHOLHDL, LDLDIRECT in the last 72 hours. Thyroid Function Tests: No results for input(s): TSH, T4TOTAL, FREET4, T3FREE, THYROIDAB in the last 72 hours. Anemia Panel: No results for input(s): VITAMINB12, FOLATE, FERRITIN, TIBC, IRON, RETICCTPCT in the last 72 hours.  Urine analysis:    Component Value Date/Time   COLORURINE AMBER (A) 09/30/2020 1035   APPEARANCEUR CLOUDY (A) 09/30/2020 1035   LABSPEC 1.012 09/30/2020 1035   PHURINE 8.0 09/30/2020 1035   GLUCOSEU NEGATIVE 09/30/2020 1035   HGBUR NEGATIVE 09/30/2020 1035   BILIRUBINUR NEGATIVE 09/30/2020 1035   KETONESUR NEGATIVE 09/30/2020 1035   PROTEINUR 30 (A) 09/30/2020 1035   NITRITE NEGATIVE 09/30/2020 1035   LEUKOCYTESUR LARGE (A) 09/30/2020 1035   Sepsis Labs: @LABRCNTIP (procalcitonin:4,lacticidven:4)  ) Recent Results (from the past 240 hour(s))  Resp Panel by RT-PCR (Flu A&B, Covid) Nasopharyngeal Swab     Status: None   Collection Time: 01/01/21  7:28 PM   Specimen: Nasopharyngeal Swab; Nasopharyngeal(NP) swabs in vial transport medium  Result Value Ref Range Status   SARS Coronavirus 2 by RT  PCR NEGATIVE NEGATIVE Final    Comment: (NOTE) SARS-CoV-2 target nucleic acids are NOT DETECTED.  The SARS-CoV-2 RNA is generally detectable in upper respiratory specimens during the acute phase of infection. The lowest concentration of SARS-CoV-2 viral copies this assay can detect is 138 copies/mL. A negative result does not preclude SARS-Cov-2 infection and should not be used as the sole basis for treatment or other patient management decisions. A negative result may occur with  improper specimen collection/handling, submission of specimen other than nasopharyngeal swab, presence of viral mutation(s) within the areas targeted by this assay, and inadequate number of viral copies(<138 copies/mL). A negative result must be combined with clinical observations, patient history, and epidemiological information. The expected result is Negative.  Fact Sheet for Patients:  EntrepreneurPulse.com.au  Fact Sheet for Healthcare Providers:  IncredibleEmployment.be  This test is no t yet approved or cleared by the [place] FDA and  has been authorized for detection and/or diagnosis of SARS-CoV-2 by FDA under an Emergency Use Authorization (EUA). This EUA will remain  in effect (meaning this test can be used) for the duration of the COVID-19 declaration under Section 564(b)(1) of the Act, 21 U.S.C.section 360bbb-3(b)(1), unless the authorization is terminated  or revoked sooner.       Influenza A by PCR NEGATIVE NEGATIVE Final   Influenza B by PCR NEGATIVE NEGATIVE Final    Comment: (NOTE) The Xpert Xpress SARS-CoV-2/FLU/RSV plus assay is intended as an aid in the diagnosis of influenza from Nasopharyngeal swab specimens and should not be used as a sole basis for treatment. Nasal washings and aspirates are unacceptable for Xpert Xpress SARS-CoV-2/FLU/RSV testing.  Fact Sheet for Patients: EntrepreneurPulse.com.au  Fact Sheet for  Healthcare Providers: IncredibleEmployment.be  This test is not yet approved or cleared by the [place] FDA and has been authorized for detection and/or diagnosis of SARS-CoV-2 by FDA under an Emergency Use Authorization (EUA). This EUA will remain in effect (meaning this test can be used) for the duration of the COVID-19 declaration under Section 564(b)(1) of the Act, 21 U.S.C. section 360bbb-3(b)(1), unless the authorization is terminated or revoked.  Performed at [facility], [place] [address] [place], [place] [address]   Blood culture (routine x 2)     Status: None (Preliminary result)   Collection Time: 01/01/21  8:08 PM   Specimen: BLOOD  Result Value Ref Range Status   Specimen Description BLOOD RIGHT ANTECUBITAL  Final   Special Requests   Final    BOTTLES DRAWN AEROBIC AND ANAEROBIC Blood Culture adequate volume   Culture   Final    NO GROWTH 3 DAYS Performed at [facility], [place] [address] [place], [place] [address]    Report Status PENDING  Incomplete      Studies: No results found.  Scheduled Meds:  amiodarone  200 mg Oral QPM   apixaban  5 mg Oral BID   artificial tears  1 application Right Eye M0Q while awake   aspirin EC  81 mg Oral QPM   atorvastatin  20 mg Oral QPM   cephALEXin  500 mg Oral Q24H   Chlorhexidine Gluconate Cloth  6 each Topical Q0600   collagenase   Topical Daily   darbepoetin (ARANESP) injection - DIALYSIS  100 mcg Intravenous Q Fri-HD   doxycycline  100 mg Oral  Q12H   gabapentin  100 mg Oral QHS   sevelamer carbonate  800 mg Oral TID WC    Continuous Infusions:  [START ON 01/09/2021] sodium thiosulfate infusion for calciphylaxis       LOS: 5 days     Phillips Climes, MD [facility]  If 7PM-7AM, please contact night-coverage [email or web address] 01/07/2021, 11:53 AM

## 2021-01-08 DIAGNOSIS — R112 Nausea with vomiting, unspecified: Secondary | ICD-10-CM | POA: Diagnosis not present

## 2021-01-08 DIAGNOSIS — L03119 Cellulitis of unspecified part of limb: Secondary | ICD-10-CM | POA: Diagnosis not present

## 2021-01-08 DIAGNOSIS — N186 End stage renal disease: Secondary | ICD-10-CM | POA: Diagnosis not present

## 2021-01-08 LAB — GLUCOSE, CAPILLARY
Glucose-Capillary: 105 mg/dL — ABNORMAL HIGH (ref 70–99)
Glucose-Capillary: 174 mg/dL — ABNORMAL HIGH (ref 70–99)
Glucose-Capillary: 179 mg/dL — ABNORMAL HIGH (ref 70–99)
Glucose-Capillary: 180 mg/dL — ABNORMAL HIGH (ref 70–99)
Glucose-Capillary: 228 mg/dL — ABNORMAL HIGH (ref 70–99)
Glucose-Capillary: 89 mg/dL (ref 70–99)

## 2021-01-08 LAB — SARS CORONAVIRUS 2 (TAT 6-24 HRS): SARS Coronavirus 2: NEGATIVE

## 2021-01-08 MED ORDER — PROSOURCE PLUS PO LIQD
30.0000 mL | Freq: Two times a day (BID) | ORAL | Status: DC
  Administered 2021-01-08 – 2021-01-09 (×2): 30 mL via ORAL
  Filled 2021-01-08 (×3): qty 30

## 2021-01-08 NOTE — TOC Progression Note (Addendum)
Transition of Care [facility]) - Progression Note    Patient Details  Name: Kathryn Hancock MRN: [id number] Date of Birth: Oct 17, 1950  [facility] of [facility]) CM/SW Contact  Reece Agar, [place] Phone Number: 01/08/2021, 1:22 PM  Clinical Narrative:    CSW contacted Angie at [facility] to provide update on outpatient palliative and possible DC tomorrow. There was no answer, CSW left a VM to follow up with Dominica weekday CSW. CSW also requested COVID test.        Expected Discharge Plan and Services                                                 Social Determinants of Health (SDOH) Interventions    Readmission Risk Interventions Readmission Risk Prevention Plan 11/02/2020  Transportation Screening Complete  Medication Review Press photographer) Complete  HRI or [place] Complete  SW Recovery Care/Counseling Consult Complete  Palliative Care Screening Not [place] Not Applicable

## 2021-01-08 NOTE — Progress Notes (Signed)
PROGRESS NOTE  Kathryn Hancock KZL:[id number] DOB: May 02, 1951 DOA: 01/01/2021 PCP: Gala Lewandowsky, MD  HPI/ Kathryn Hancock is a 70 y.o. 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 Doren Custard vascular surgeon on December 28, 2020.  Patient has been feeling persistently nauseous unable to take in and was referred to the ER.  In addition patient's sister felt that the patient's left foot has become more swollen and has noticed some discharge.  Patient also developed right-sided periorbital hematoma over the last 1 week.  This was related to the coughing/retching. In the ER x-rays reveal foreign body of the left foot, and labs showed leukocytosis with WBC count of 19,000.  Patient also was hypoglycemic in the ER.  CT head followed by CT maxillofacial and CT abdomen pelvis was done which shows preseptal hematoma of the right orbit and nothing acute in the abdomen.  Patient had been empirically started on antibiotics for the left foot possible infection.  Admitted for further management.  COVID test was negative.    Subjective  Patient reports she is feeling much better today, appetite has improved, no nausea, pain her pressure ulcers is controlled.   Assessment/Plan: Principal Problem:   Intractable nausea and vomiting Active Problems:   Left atrial myxoma   AAA (abdominal aortic aneurysm) without rupture (HCC)   ESRD (end stage renal disease) on dialysis (HCC)   PAF (paroxysmal atrial fibrillation) (HCC) -  CHA2DS2-VASc Score 5 (Age, Female, HTN, DM, Vascular)- On Eliquis   Anemia of chronic disease   DM (diabetes mellitus), type 2 with renal complications (HCC)   S/P CABG (coronary artery bypass graft)     Possible  cellulitis/early osteomyelitis of left foot/ Known history of L gangrenous toes/ Recent R foot transmetatarsal amputation -she is currently afebrile, leukocytosis has resolved -Follow-up blood cultures, so far no growth to date - CT left foot with possible early osteomyelitis, no organized abscess noted - Vascular surgery consulted-no further recommendation, will not pursue any further amputation, she was empirically on IV vancomycin and cefepime, I have discussed with Dr. Scot Dock, she can be transitioned to oral antibiotic, where he can follow with her in 2 weeks as an outpatient, changed to doxycycline and Keflex.  Intractable nausea afebrile, with leukocytosis CT abdomen pelvis unremarkable Difficultly improved, oral intake and appetite has significantly improved.  ESRD on HD M/W/F Nephrology consulted.  Preseptal hematoma of the right eye/right subconjunctival hemorrhage Secondary to fall and facial trauma Currently on [facility], will need it as she has history of embolic episodes Denies any worsening vision today Ophthalmology input greatly appreciated.  Paroxysmal A. Fib Heart rate controlled Continue amiodarone, restart Eliquis on 01/04/21 Monitor for worsening bleeding Telemetry  Diabetes mellitus type 2 Last A1c 5 SSI, Accu-Cheks, hypoglycemic protocol  Anemia of chronic kidney disease Hemoglobin baseline 9-10 Anemia panel showed iron 28, ferritin elevated at 710. recieved 1 unit PRBC transfusion 7/28.  Goals of care discussion/Failure to thrive Palliative team consulted due to multiple comorbidities, poor prognosis, extremely poor life quality, with amputations, gangrene, calciphylaxis, pressure ulcers, dialysis dependent, failure to thrive, palliative have discussed with the family and patient, plan to continue with full measures currently.    Multiple pressure ulcers/calciphylaxis wound -Wound care consulted, patient currently with hydrotherapy,  likely will need  hydrotherapy for next 48 hours, likely by then her eschar will be off.  Continue with local wound care. -With extensive wounds in the right hip area, bilateral hips, sacrum,on Na thio tiw w/ HD, no Ca/ vit D products/ low Ca bath Pressure Injury 01/03/21 Coccyx Medial Stage 3 -  Full thickness tissue loss. Subcutaneous fat may be visible but bone, tendon or muscle are NOT exposed. 2 cm x 1 cm shallow bed with yellow/white (Active)  01/03/21   Location: Coccyx  Location Orientation: Medial  Staging: Stage 3 -  Full thickness tissue loss. Subcutaneous fat may be visible but bone, tendon or muscle are NOT exposed.  Wound Description (Comments): 2 cm x 1 cm shallow bed with yellow/white  Present on Admission: Yes     Pressure Injury 10/31/20 Buttocks Right;Upper Unstageable - Full thickness tissue loss in which the base of the injury is covered by slough (yellow, tan, gray, green or brown) and/or eschar (tan, brown or black) in the wound bed. 2 cm x 1.5 cm eschar (Active)  10/31/20 1500  Location: Buttocks  Location Orientation: Right;Upper  Staging: Unstageable - Full thickness tissue loss in which the base of the injury is covered by slough (yellow, tan, gray, green or brown) and/or eschar (tan, brown or black) in the wound bed.  Wound Description (Comments): 2 cm x 1.5 cm eschar  Present on Admission: Yes     Pressure Injury 10/31/20 Buttocks Right;Lower Unstageable - Full thickness tissue loss in which the base of the injury is covered by slough (yellow, tan, gray, green or brown) and/or eschar (tan, brown or black) in the wound bed. 1 cm x 1 cm eschar (Active)  10/31/20 1500  Location: Buttocks  Location Orientation: Right;Lower  Staging: Unstageable - Full thickness tissue loss in which the base of the injury is covered by slough (yellow, tan, gray, green or brown) and/or eschar (tan, brown or black) in the wound bed.  Wound Description (Comments): 1 cm x 1 cm eschar  Present on Admission:  Yes     Pressure Injury 01/03/21 Heel Posterior;Right Unstageable - Full thickness tissue loss in which the base of the injury is covered by slough (yellow, tan, gray, green or brown) and/or eschar (tan, brown or black) in the wound bed. eschar (Active)  01/03/21   Location: Heel  Location Orientation: Posterior;Right  Staging: Unstageable - Full thickness tissue loss in which the base of the injury is covered by slough (yellow, tan, gray, green or brown) and/or eschar (tan, brown or black) in the wound bed.  Wound Description (Comments): eschar  Present on Admission: Yes     Pressure Injury 01/03/21 Thigh Right;Medial Stage 3 -  Full thickness tissue loss. Subcutaneous fat may be visible but bone, tendon or muscle are NOT exposed. (Active)  01/03/21   Location: Thigh  Location Orientation: Right;Medial  Staging: Stage 3 -  Full thickness tissue loss. Subcutaneous fat may be visible but bone, tendon or muscle are NOT exposed.  Wound Description (Comments):   Present on Admission: Yes     Pressure Injury Flank Left Unstageable - Full thickness tissue loss in which the base of the injury is covered by slough (yellow, tan, gray, green or brown) and/or eschar (tan, brown or black) in the wound bed. (Active)     Location: Flank  Location Orientation: Left  Staging: Unstageable - Full thickness tissue loss in which the base of the injury is covered by slough (yellow, tan, gray,  green or brown) and/or eschar (tan, brown or black) in the wound bed.  Wound Description (Comments):   Present on Admission: Yes     Pressure Injury 01/03/21 Buttocks Left Unstageable - Full thickness tissue loss in which the base of the injury is covered by slough (yellow, tan, gray, green or brown) and/or eschar (tan, brown or black) in the wound bed. (Active)  01/03/21 0826  Location: Buttocks  Location Orientation: Left  Staging: Unstageable - Full thickness tissue loss in which the base of the injury is covered by  slough (yellow, tan, gray, green or brown) and/or eschar (tan, brown or black) in the wound bed.  Wound Description (Comments):   Present on Admission: Yes     Pressure Injury 11/28/20 Buttocks Right Unstageable - Full thickness tissue loss in which the base of the injury is covered by slough (yellow, tan, gray, green or brown) and/or eschar (tan, brown or black) in the wound bed. (Active)  11/28/20 0826  Location: Buttocks  Location Orientation: Right  Staging: Unstageable - Full thickness tissue loss in which the base of the injury is covered by slough (yellow, tan, gray, green or brown) and/or eschar (tan, brown or black) in the wound bed.  Wound Description (Comments):   Present on Admission: Yes     Pressure Injury 01/03/21 Hip Left Unstageable - Full thickness tissue loss in which the base of the injury is covered by slough (yellow, tan, gray, green or brown) and/or eschar (tan, brown or black) in the wound bed. (Active)  01/03/21   Location: Hip  Location Orientation: Left  Staging: Unstageable - Full thickness tissue loss in which the base of the injury is covered by slough (yellow, tan, gray, green or brown) and/or eschar (tan, brown or black) in the wound bed.  Wound Description (Comments):   Present on Admission: Yes        Estimated body mass index is 23.74 kg/m as calculated from the following:   Height as of 12/28/20: 5\' 3"  (1.6 m).   Weight as of this encounter: 60.8 kg.     Code Status: Full  Family Communication: Patient is awake alert, appropriate and coherent, all her questions were answered, none at bedside.  Disposition Plan: Status is: Inpatient  The patient will require care spanning > 2 midnights and should be moved to inpatient because: Inpatient level of care appropriate due to severity of illness  Dispo: The patient is from: SNF              Anticipated d/c is to: SNF              Patient currently is not medically stable to d/c.  Likely tomorrow to  SNF after hydrotherapy and hemodialysis.   Difficult to place patient No     Consultants: Vascular surgery Nephrology Palliaitve Neurology  Procedures: None  Antimicrobials: Vancomycin Cefepime  DVT prophylaxis: Eliquis   Objective: Vitals:   01/07/21 2043 01/08/21 0527 01/08/21 0600 01/08/21 0956  BP: 138/67 (!) 157/67  (!) 135/57  Pulse: 71 76  63  Resp: 16 16  17   Temp: 98.2 F (36.8 C) 98.2 F (36.8 C)  98.2 F (36.8 C)  TempSrc: Oral Oral    SpO2: 99% 100%  100%  Weight:   60.8 kg     Intake/Output Summary (Last 24 hours) at 01/08/2021 1317 Last data filed at 01/08/2021 0800 Gross per 24 hour  Intake 560 ml  Output --  Net 560 ml  Filed Weights   01/06/21 0856 01/06/21 1241 01/08/21 0600  Weight: 86.7 kg 83.8 kg 60.8 kg    Exam:   Awake Alert, Oriented X 3, frail, right subconjunctival hemorrhage improving Symmetrical Chest wall movement, Good air movement bilaterally, CTAB RRR,No Gallops,Rubs or new Murmurs, No Parasternal Heave +ve B.Sounds, Abd Soft, No tenderness, No rebound - guarding or rigidity. Multiple bruises/ecchymosis bilateral upper extremity, and with multiple pressure ulcers in the buttocks area, wounds in the right upper thigh area, bandaged, left foot dry gangrene, right foot  with swelling/staples       Data Reviewed: CBC: Recent Labs  Lab 01/03/21 0414 01/04/21 0302 01/05/21 0338 01/06/21 0338 01/07/21 0220  WBC 11.8* 9.9 8.9 8.9 10.8*  NEUTROABS 9.9* 8.2* 7.2 6.7 8.5*  HGB 8.5* 8.3* 7.4* 9.1* 9.7*  HCT 26.5* 26.4* 24.3* 28.0* 31.2*  MCV 99.6 99.6 101.7* 97.6 99.7  PLT 179 146* 127* 111* 268*   Basic Metabolic Panel: Recent Labs  Lab 01/03/21 0415 01/04/21 0302 01/05/21 0338 01/06/21 0338 01/07/21 0220  NA 131* 135 134* 135 133*  K 5.5* 3.7 3.4* 3.5 3.6  CL 91* 97* 98 98 97*  CO2 19* 23 22 25 26   GLUCOSE 78 115* 98 84 189*  BUN 86* 35* 16 25* 15  CREATININE 9.20* 5.41* 3.50* 4.88* 3.64*  CALCIUM 8.5*  7.9* 8.0* 8.4* 7.9*  PHOS 6.8* 4.7* 4.1 4.2 1.9*   GFR: Estimated Creatinine Clearance: 12.4 mL/min (A) (by C-G formula based on SCr of 3.64 mg/dL (H)). Liver Function Tests: Recent Labs  Lab 01/01/21 1434 01/02/21 0500 01/03/21 0415 01/04/21 0302 01/05/21 0338 01/06/21 0338 01/07/21 0220  AST 13* 11*  --   --   --   --   --   ALT 10 9  --   --   --   --   --   ALKPHOS 117 84  --   --   --   --   --   BILITOT 0.8 0.9  --   --   --   --   --   PROT 6.1* 4.5*  --   --   --   --   --   ALBUMIN 2.5* 1.9* 1.9* 1.8* 1.8* 1.7* 1.9*   Recent Labs  Lab 01/01/21 1434  LIPASE 29   No results for input(s): AMMONIA in the last 168 hours. Coagulation Profile: Recent Labs  Lab 01/01/21 1842  INR 1.8*   Cardiac Enzymes: No results for input(s): CKTOTAL, CKMB, CKMBINDEX, TROPONINI in the last 168 hours. BNP (last 3 results) No results for input(s): PROBNP in the last 8760 hours. HbA1C: No results for input(s): HGBA1C in the last 72 hours. CBG: Recent Labs  Lab 01/07/21 1628 01/07/21 2130 01/08/21 0016 01/08/21 0441 01/08/21 1126  GLUCAP 121* 220* 228* 89 105*   Lipid Profile: No results for input(s): CHOL, HDL, LDLCALC, TRIG, CHOLHDL, LDLDIRECT in the last 72 hours. Thyroid Function Tests: No results for input(s): TSH, T4TOTAL, FREET4, T3FREE, THYROIDAB in the last 72 hours. Anemia Panel: No results for input(s): VITAMINB12, FOLATE, FERRITIN, TIBC, IRON, RETICCTPCT in the last 72 hours.  Urine analysis:    Component Value Date/Time   COLORURINE AMBER (A) 09/30/2020 1035   APPEARANCEUR CLOUDY (A) 09/30/2020 1035   LABSPEC 1.012 09/30/2020 1035   PHURINE 8.0 09/30/2020 1035   GLUCOSEU NEGATIVE 09/30/2020 1035   HGBUR NEGATIVE 09/30/2020 1035   BILIRUBINUR NEGATIVE 09/30/2020 1035   KETONESUR NEGATIVE 09/30/2020 1035   PROTEINUR 30 (  A) 09/30/2020 1035   NITRITE NEGATIVE 09/30/2020 1035   LEUKOCYTESUR LARGE (A) 09/30/2020 1035   Sepsis  Labs: @LABRCNTIP (procalcitonin:4,lacticidven:4)  ) Recent Results (from the past 240 hour(s))  Resp Panel by RT-PCR (Flu A&B, Covid) Nasopharyngeal Swab     Status: None   Collection Time: 01/01/21  7:28 PM   Specimen: Nasopharyngeal Swab; Nasopharyngeal(NP) swabs in vial transport medium  Result Value Ref Range Status   SARS Coronavirus 2 by RT PCR NEGATIVE NEGATIVE Final    Comment: (NOTE) SARS-CoV-2 target nucleic acids are NOT DETECTED.  The SARS-CoV-2 RNA is generally detectable in upper respiratory specimens during the acute phase of infection. The lowest concentration of SARS-CoV-2 viral copies this assay can detect is 138 copies/mL. A negative result does not preclude SARS-Cov-2 infection and should not be used as the sole basis for treatment or other patient management decisions. A negative result may occur with  improper specimen collection/handling, submission of specimen other than nasopharyngeal swab, presence of viral mutation(s) within the areas targeted by this assay, and inadequate number of viral copies(<138 copies/mL). A negative result must be combined with clinical observations, patient history, and epidemiological information. The expected result is Negative.  Fact Sheet for Patients:  EntrepreneurPulse.com.au  Fact Sheet for Healthcare Providers:  IncredibleEmployment.be  This test is no t yet approved or cleared by the [place] FDA and  has been authorized for detection and/or diagnosis of SARS-CoV-2 by FDA under an Emergency Use Authorization (EUA). This EUA will remain  in effect (meaning this test can be used) for the duration of the COVID-19 declaration under Section 564(b)(1) of the Act, 21 U.S.C.section 360bbb-3(b)(1), unless the authorization is terminated  or revoked sooner.       Influenza A by PCR NEGATIVE NEGATIVE Final   Influenza B by PCR NEGATIVE NEGATIVE Final    Comment: (NOTE) The Xpert  Xpress SARS-CoV-2/FLU/RSV plus assay is intended as an aid in the diagnosis of influenza from Nasopharyngeal swab specimens and should not be used as a sole basis for treatment. Nasal washings and aspirates are unacceptable for Xpert Xpress SARS-CoV-2/FLU/RSV testing.  Fact Sheet for Patients: EntrepreneurPulse.com.au  Fact Sheet for Healthcare Providers: IncredibleEmployment.be  This test is not yet approved or cleared by the [place] FDA and has been authorized for detection and/or diagnosis of SARS-CoV-2 by FDA under an Emergency Use Authorization (EUA). This EUA will remain in effect (meaning this test can be used) for the duration of the COVID-19 declaration under Section 564(b)(1) of the Act, 21 U.S.C. section 360bbb-3(b)(1), unless the authorization is terminated or revoked.  Performed at [facility], [place] [address], [place], [place] [address]   Blood culture (routine x 2)     Status: None   Collection Time: 01/01/21  8:08 PM   Specimen: BLOOD  Result Value Ref Range Status   Specimen Description BLOOD RIGHT ANTECUBITAL  Final   Special Requests   Final    BOTTLES DRAWN AEROBIC AND ANAEROBIC Blood Culture adequate volume   Culture   Final    NO GROWTH 5 DAYS Performed at [facility], [place] [address], [place], [place] [address]    Report Status 01/07/2021 FINAL  Final      Studies: No results found.  Scheduled Meds:  (feeding supplement) PROSource Plus  30 mL Oral BID BM   amiodarone  200 mg Oral QPM   apixaban  5 mg Oral BID   artificial tears  1 application Right Eye W5Y while  awake   aspirin EC  81 mg Oral QPM   atorvastatin  20 mg Oral QPM   cephALEXin  500 mg Oral Q24H   Chlorhexidine Gluconate Cloth  6 each Topical Q0600   collagenase   Topical Daily   darbepoetin (ARANESP) injection - DIALYSIS  100 mcg Intravenous Q Fri-HD   doxycycline  100 mg Oral Q12H   gabapentin  100 mg Oral QHS     Continuous Infusions:  [START ON 01/09/2021] sodium thiosulfate infusion for calciphylaxis       LOS: 6 days     Phillips Climes, MD [facility]  If 7PM-7AM, please contact night-coverage [email or web address] 01/08/2021, 1:17 PM

## 2021-01-08 NOTE — Progress Notes (Signed)
[facility] Progress Note   Subjective:  Seen in room - says hydrotherapy is dramatically improving her pain. No CP or dyspnea.   Objective Vitals:   01/07/21 1900 01/07/21 2043 01/08/21 0527 01/08/21 0600  BP: 115/70 138/67 (!) 157/67   Pulse: 99 71 76   Resp: 20 16 16    Temp: 98.2 F (36.8 C) 98.2 F (36.8 C) 98.2 F (36.8 C)   TempSrc: Oral Oral Oral   SpO2: 99% 99% 100%   Weight:    60.8 kg   Physical Exam General: Chronically ill appearing woman, NAD. R eye with significant subconjunctival hemorrhage Heart: RRR; no murmur Lungs: CTA anteriorly Abdomen: soft Extremities: 1-2+ thigh/RLE edema. Dialysis Access: R IJ TDC, LUE AVF  Additional Objective Labs: Basic Metabolic Panel: Recent Labs  Lab 01/05/21 0338 01/06/21 0338 01/07/21 0220  NA 134* 135 133*  K 3.4* 3.5 3.6  CL 98 98 97*  CO2 22 25 26   GLUCOSE 98 84 189*  BUN 16 25* 15  CREATININE 3.50* 4.88* 3.64*  CALCIUM 8.0* 8.4* 7.9*  PHOS 4.1 4.2 1.9*   Liver Function Tests: Recent Labs  Lab 01/01/21 1434 01/02/21 0500 01/03/21 0415 01/05/21 0338 01/06/21 0338 01/07/21 0220  AST 13* 11*  --   --   --   --   ALT 10 9  --   --   --   --   ALKPHOS 117 84  --   --   --   --   BILITOT 0.8 0.9  --   --   --   --   PROT 6.1* 4.5*  --   --   --   --   ALBUMIN 2.5* 1.9*   < > 1.8* 1.7* 1.9*   < > = values in this interval not displayed.   Recent Labs  Lab 01/01/21 1434  LIPASE 29   CBC: Recent Labs  Lab 01/03/21 0414 01/04/21 0302 01/05/21 0338 01/06/21 0338 01/07/21 0220  WBC 11.8* 9.9 8.9 8.9 10.8*  NEUTROABS 9.9* 8.2* 7.2 6.7 8.5*  HGB 8.5* 8.3* 7.4* 9.1* 9.7*  HCT 26.5* 26.4* 24.3* 28.0* 31.2*  MCV 99.6 99.6 101.7* 97.6 99.7  PLT 179 146* 127* 111* 133*   Medications:  [START ON 01/09/2021] sodium thiosulfate infusion for calciphylaxis      amiodarone  200 mg Oral QPM   apixaban  5 mg Oral BID   artificial tears  1 application Right Eye W8G while awake   aspirin EC   81 mg Oral QPM   atorvastatin  20 mg Oral QPM   cephALEXin  500 mg Oral Q24H   Chlorhexidine Gluconate Cloth  6 each Topical Q0600   collagenase   Topical Daily   darbepoetin (ARANESP) injection - DIALYSIS  100 mcg Intravenous Q Fri-HD   doxycycline  100 mg Oral Q12H   gabapentin  100 mg Oral QHS   sevelamer carbonate  800 mg Oral TID WC    Dialysis Orders: [facility] MWF   3h 47min  250/500   2/2 bath  59.5kg  TDC + AVF (using intermittently)  Hep none  - mircera 100 q2 last 7/13, due 7/27  - Na thio 25 q HD   Assessment/ Plan: L foot gangrene/recent R TMA -- Early osteo on CT, prev on IV cefepime v-> now PO doxycycline + Keflex. VVS consulted, no plans for further amputation given poor overall prognosis. ESRD -  HD MWF - next HD tomorrow (8/1). Wounds - has multiple  wounds, getting hydrotherapy, pain control.  These are calciphylaxis wounds and are usually very painful. BP/volume: Slowly improving edema. 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 Anemia  - Hgb 9.7, continue Aranesp q Friday for now. Metabolic bone disease: CorrCa ok without VDRA, Phos low - hold Renvela. Nutrition - Renal diet with fluid restriction, needs protein supps. Afib - on Eliquis, amiodarone, Coreg   GOC - PCT is working w/ pt and family  Veneta Penton, Hershal Coria 01/08/2021, 9:37 AM  [employer]

## 2021-01-09 DIAGNOSIS — Z992 Dependence on renal dialysis: Secondary | ICD-10-CM | POA: Diagnosis not present

## 2021-01-09 DIAGNOSIS — N186 End stage renal disease: Secondary | ICD-10-CM | POA: Diagnosis not present

## 2021-01-09 DIAGNOSIS — R112 Nausea with vomiting, unspecified: Secondary | ICD-10-CM | POA: Diagnosis not present

## 2021-01-09 DIAGNOSIS — D638 Anemia in other chronic diseases classified elsewhere: Secondary | ICD-10-CM | POA: Diagnosis not present

## 2021-01-09 LAB — BASIC METABOLIC PANEL
Anion gap: 12 (ref 5–15)
BUN: 36 mg/dL — ABNORMAL HIGH (ref 8–23)
CO2: 23 mmol/L (ref 22–32)
Calcium: 8.4 mg/dL — ABNORMAL LOW (ref 8.9–10.3)
Chloride: 100 mmol/L (ref 98–111)
Creatinine, Ser: 6.1 mg/dL — ABNORMAL HIGH (ref 0.44–1.00)
GFR, Estimated: 7 mL/min — ABNORMAL LOW (ref >60)
Glucose, Bld: 175 mg/dL — ABNORMAL HIGH (ref 70–99)
Potassium: 4.1 mmol/L (ref 3.5–5.1)
Sodium: 135 mmol/L (ref 135–145)

## 2021-01-09 LAB — CBC
HCT: 31.2 % — ABNORMAL LOW (ref 36.0–46.0)
Hemoglobin: 9.5 g/dL — ABNORMAL LOW (ref 12.0–15.0)
MCH: 30.8 pg (ref 26.0–34.0)
MCHC: 30.4 g/dL (ref 30.0–36.0)
MCV: 101.3 fL — ABNORMAL HIGH (ref 80.0–100.0)
Platelets: 157 10*3/uL (ref 150–400)
RBC: 3.08 MIL/uL — ABNORMAL LOW (ref 3.87–5.11)
RDW: 16.5 % — ABNORMAL HIGH (ref 11.5–15.5)
WBC: 10.8 10*3/uL — ABNORMAL HIGH (ref 4.0–10.5)
nRBC: 0 % (ref 0.0–0.2)

## 2021-01-09 LAB — GLUCOSE, CAPILLARY: Glucose-Capillary: 102 mg/dL — ABNORMAL HIGH (ref 70–99)

## 2021-01-09 LAB — PHOSPHORUS: Phosphorus: 3.8 mg/dL (ref 2.5–4.6)

## 2021-01-09 MED ORDER — OXYCODONE HCL 5 MG PO TABS: 5.0000 mg | ORAL_TABLET | Freq: Four times a day (QID) | ORAL | 0 refills | Status: DC | PRN

## 2021-01-09 MED ORDER — HEPARIN SODIUM (PORCINE) 1000 UNIT/ML IJ SOLN
3200.0000 [IU] | Freq: Once | INTRAMUSCULAR | Status: AC
  Administered 2021-01-09: 3200 [IU] via INTRAVENOUS

## 2021-01-09 MED ORDER — OXYCODONE HCL 5 MG PO TABS: 5.0000 mg | ORAL_TABLET | Freq: Four times a day (QID) | ORAL | 0 refills | Status: AC | PRN

## 2021-01-09 MED ORDER — DOXYCYCLINE HYCLATE 100 MG PO TABS: 100.0000 mg | ORAL_TABLET | Freq: Two times a day (BID) | ORAL | 0 refills | Status: AC

## 2021-01-09 MED ORDER — CEPHALEXIN 500 MG PO CAPS: 500.0000 mg | ORAL_CAPSULE | ORAL | 0 refills | Status: AC

## 2021-01-09 NOTE — NC FL2 (Signed)
[place] LEVEL OF CARE SCREENING TOOL     IDENTIFICATION  Patient Name: Kathryn Hancock Birthdate: 06/22/1950 Sex: female Admission Date (Current Location): 01/01/2021  [facility] and [place] Number:  [employer] and Address:  The 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   Limb ischemia 11/25/2020   Pressure injury of skin 11/01/2020   Iron deficiency anemia, unspecified 10/22/2020   Malnutrition of moderate degree 09/30/2020   Current use of long term anticoagulation    Weakness    Anemia 09/29/2020   Personal history of COVID-19 09/28/2020   COVID-19 08/22/2020   Hyperlipidemia associated with type 2 diabetes mellitus ([place]) 08/05/2020   Hypokalemia 07/27/2020   Unspecified abnormal findings in urine 07/26/2020   Unspecified severe protein-calorie malnutrition ([place]) 07/21/2020   Encounter for immunization 07/19/2020   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[place]) 07/16/2020   Coagulation defect, unspecified ([place]) 07/16/2020   Allergy, unspecified, initial encounter 07/15/2020   Anaphylactic shock, unspecified, initial encounter 07/15/2020   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   Pain, unspecified 07/15/2020   Pruritus, unspecified 07/15/2020   Secondary hyperparathyroidism of renal origin ([place]) 07/15/2020   Shortness of breath 07/15/2020   PAOD (peripheral  arterial occlusive disease) ([place]) 07/08/2020   ESRD (end stage renal disease) on dialysis ([place]) 07/01/2020   PAF (paroxysmal atrial fibrillation) (HCC) -  CHA2DS2-VASc Score 5 (Age, Female, HTN, DM, Vascular)- On Eliquis 07/01/2020   Anemia of chronic disease 07/01/2020   Essential hypertension 07/01/2020   DM (diabetes mellitus), type 2 with renal complications ([place]) 20/25/4270   Cellulitis of toe 06/05/2020   S/P CABG (coronary artery bypass graft) 06/02/2020   Coronary artery disease involving native coronary artery of native heart without angina pectoris 06/01/2020   CKD (chronic kidney disease) stage 3, GFR 30-59 ml/min ([place]) 06/01/2020   HLD (hyperlipidemia) 06/01/2020   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 05/27/2020   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   Tobacco abuse 12/10/2019   AAA (abdominal aortic aneurysm) without rupture ([place]) 12/10/2019    Orientation RESPIRATION BLADDER Height & Weight     Self, Time, Situation, Place  Normal Incontinent Weight: 138 lb 3.7 oz (62.7 kg) Height:     BEHAVIORAL SYMPTOMS/MOOD NEUROLOGICAL BOWEL NUTRITION STATUS      Continent Diet (Renal with 1200 mL fluid restriction)  AMBULATORY STATUS COMMUNICATION OF NEEDS Skin   Limited Assist Verbally Other (Comment) (Unstageable pressure injury to left flank; Unstageable pressure injury left/right buttocks; Stage 3 pressure injury (PI) medical coccyx; Unstageable PI to right posterior heel; Stage 3 PI to right medical thigh; Anterior left toes necrotic)                       Personal Care Assistance Level of Assistance  Bathing, Feeding, Dressing Bathing Assistance: Limited assistance Feeding assistance: Independent Dressing Assistance: Limited assistance     Functional  Limitations Info  Sight, Hearing, Speech Sight Info: Adequate Hearing Info: Adequate Speech Info: Adequate    SPECIAL CARE FACTORS FREQUENCY  PT (By licensed PT), OT (By licensed OT)     PT Frequency: PT evaluation 7/29  - PT at SNF eval and treat, a minimum of 5 days per week OT Frequency: OT evaluation 7/29. OT at SNF eval and treat, a minimum of 5 days per week            Contractures Contractures Info: Not present    Additional Factors Info  Code Status Code Status Info: Full Allergies Info: Penicillins   Insulin Sliding Scale Info: insulin lispro 100 UNIT/ML KwikPen  Commonly known as: HUMALOG  Inject 0-6 Units into the skin in the morning, at noon, in the evening, and at bedtime. If 70-150=0 units, 151-200=1 unit, 201-250=2 units, 251-300=3 units, 301-350=4 units, 351-400=6 units over 400 Call MD.       Current Medications (01/09/2021):  This is the current hospital active medication list Current Facility-Administered Medications  Medication Dose Route Frequency Provider Last Rate Last Admin   (feeding supplement) PROSource Plus liquid 30 mL  30 mL Oral BID BM Loren Racer, PA-C   30 mL at 01/09/21 1346   amiodarone (PACERONE) tablet 200 mg  200 mg Oral QPM Rise Patience, MD   200 mg at 01/08/21 1804   apixaban (ELIQUIS) tablet 5 mg  5 mg Oral BID Alma Friendly, MD   5 mg at 01/09/21 1345   artificial tears (LACRILUBE) ophthalmic ointment 1 application  1 application Right Eye W2H while awake Lonia Skinner, MD   1 application at 85/27/78 1345   aspirin EC tablet 81 mg  81 mg Oral QPM Rise Patience, MD   81 mg at 01/08/21 1804   atorvastatin (LIPITOR) tablet 20 mg  20 mg Oral QPM Rise Patience, MD   20 mg at 01/08/21 1804   cephALEXin (KEFLEX) capsule 500 mg  500 mg Oral Q24H Elgergawy, Silver Huguenin, MD   500 mg at 01/08/21 2139   Chlorhexidine Gluconate Cloth 2 % PADS 6 each  6 each Topical Q0600 Lynnda Child, PA-C   6 each at 01/08/21 0500   collagenase (SANTYL) ointment   Topical Daily Elgergawy, Silver Huguenin, MD   Given at 01/09/21 1344   Darbepoetin Alfa (ARANESP) injection 100 mcg  100 mcg Intravenous Q Asencion Partridge, MD   100 mcg at 01/06/21 1021    doxycycline (VIBRA-TABS) tablet 100 mg  100 mg Oral Q12H Elgergawy, Silver Huguenin, MD   100 mg at 01/09/21 1345   gabapentin (NEURONTIN) capsule 100 mg  100 mg Oral QHS Earlie Counts, NP   100 mg at 01/08/21 2139   HYDROcodone-acetaminophen (NORCO/VICODIN) 5-325 MG per tablet 1 tablet  1 tablet Oral Q6H PRN Alma Friendly, MD   1 tablet at 01/09/21 0824   metoprolol tartrate (LOPRESSOR) injection 2.5 mg  2.5 mg Intravenous Q4H PRN Elgergawy, Silver Huguenin, MD       ondansetron (ZOFRAN) tablet 4 mg  4 mg Oral Q6H PRN Rise Patience, MD   4 mg at 01/07/21 0455   Or   ondansetron (ZOFRAN) injection 4 mg  4 mg Intravenous Q6H PRN Rise Patience, MD   4 mg at 01/07/21 1740   oxyCODONE (Oxy IR/ROXICODONE) immediate release tablet 5 mg  5 mg Oral Q6H PRN Earlie Counts, NP   5 mg at 01/09/21 1345  prochlorperazine (COMPAZINE) tablet 5 mg  5 mg Oral Q6H PRN Earlie Counts, NP   5 mg at 01/09/21 1345   sodium thiosulfate 25 g in sodium chloride 0.9 % 200 mL Infusion for Calciphylaxis  25 g Intravenous Q M,W,F-HD Roney Jaffe, MD   Stopped at 01/09/21 1258     Discharge Medications: Please see discharge summary for a list of discharge medications.  Relevant Imaging Results:  Relevant Lab Results:   Additional Information ss#. [id number]. Moderna COVID-19 Vaccine 11/20/19, 10/22/19.  Dialysis TTS at 7:30 am.  Transmetatarsal amputation of right foot; unstageable pressure injury to left hip; Incision right leg and right foot.  Sable Feil, LCSW

## 2021-01-09 NOTE — Therapy (Signed)
OT Cancellation Note  Patient Details Name: Kathryn Hancock MRN: [id number] DOB: Sep 13, 1950   Cancelled Treatment:    Reason Eval/Treat Not Completed: Patient at procedure or test/ unavailable;Other (comment)  Pt off floor for HD. Will f/u for OT as schedule and time allows next available date.  Carlynn Herald Alissia Lory Beth Dixon, OTR/L 01/09/2021, 10:23 AM

## 2021-01-09 NOTE — Progress Notes (Addendum)
[facility] Progress Note   Subjective: Seen on HD. No C/Os. Says she is feeling better and that she may go SNF Wednesday-  possibly today ?    Objective Vitals:   01/09/21 0700 01/09/21 0905 01/09/21 0915 01/09/21 0925  BP:  135/60 (!) 145/60 130/69  Pulse:      Resp:  10 11 12   Temp:  98.2 F (36.8 C)    TempSrc:      SpO2:  100%    Weight: 62.7 kg      Physical Exam General: Chronically ill appearing female in NAD HEENT: R subconjunctival hemorrhage resolving Heart: S1,S2 no M/R/G Lungs: CTAB Abdomen: NABS Extremities:1+ thigh edema, RLE edema Dialysis Access: RIJ TDC blood lines connected    Additional Objective Labs: Basic Metabolic Panel: Recent Labs  Lab 01/05/21 0338 01/06/21 0338 01/07/21 0220 01/09/21 0030  NA 134* 135 133* 135  K 3.4* 3.5 3.6 4.1  CL 98 98 97* 100  CO2 22 25 26 23   GLUCOSE 98 84 189* 175*  BUN 16 25* 15 36*  CREATININE 3.50* 4.88* 3.64* 6.10*  CALCIUM 8.0* 8.4* 7.9* 8.4*  PHOS 4.1 4.2 1.9*  --    Liver Function Tests: Recent Labs  Lab 01/05/21 0338 01/06/21 0338 01/07/21 0220  ALBUMIN 1.8* 1.7* 1.9*   No results for input(s): LIPASE, AMYLASE in the last 168 hours. CBC: Recent Labs  Lab 01/04/21 0302 01/05/21 0338 01/06/21 0338 01/07/21 0220 01/09/21 0030  WBC 9.9 8.9 8.9 10.8* 10.8*  NEUTROABS 8.2* 7.2 6.7 8.5*  --   HGB 8.3* 7.4* 9.1* 9.7* 9.5*  HCT 26.4* 24.3* 28.0* 31.2* 31.2*  MCV 99.6 101.7* 97.6 99.7 101.3*  PLT 146* 127* 111* 133* 157   Blood Culture    Component Value Date/Time   SDES BLOOD RIGHT ANTECUBITAL 01/01/2021 2008   SPECREQUEST  01/01/2021 2008    BOTTLES DRAWN AEROBIC AND ANAEROBIC Blood Culture adequate volume   CULT  01/01/2021 2008    NO GROWTH 5 DAYS Performed at [facility], [place] [address], [place], [place] [address]    REPTSTATUS 01/07/2021 FINAL 01/01/2021 2008    Cardiac Enzymes: No results for input(s): CKTOTAL, CKMB, CKMBINDEX, TROPONINI in the last 168  hours. CBG: Recent Labs  Lab 01/08/21 1126 01/08/21 1657 01/08/21 1934 01/08/21 2341 01/09/21 0345  GLUCAP 105* 174* 180* 179* 102*   Iron Studies: No results for input(s): IRON, TIBC, TRANSFERRIN, FERRITIN in the last 72 hours. @lablastinr3 @ Studies/Results: No results found. Medications:  sodium thiosulfate infusion for calciphylaxis      (feeding supplement) PROSource Plus  30 mL Oral BID BM   amiodarone  200 mg Oral QPM   apixaban  5 mg Oral BID   artificial tears  1 application Right Eye O9B while awake   aspirin EC  81 mg Oral QPM   atorvastatin  20 mg Oral QPM   cephALEXin  500 mg Oral Q24H   Chlorhexidine Gluconate Cloth  6 each Topical Q0600   collagenase   Topical Daily   darbepoetin (ARANESP) injection - DIALYSIS  100 mcg Intravenous Q Fri-HD   doxycycline  100 mg Oral Q12H   gabapentin  100 mg Oral QHS     Dialysis Orders: [facility] MWF   3h 38min  250/500   2/2 bath  59.5kg  TDC + AVF (using intermittently)    -Hep none  - mircera 100 q2 last 7/13, due 7/27  - Na thio 25 q HD   Assessment/  Plan: L foot gangrene/recent R TMA -- Early osteo on CT, prev on IV cefepime v-> now PO doxycycline + Keflex. VVS consulted, no plans for further amputation given poor overall prognosis. ESRD -  HD MWF - next HD today (8/1). Wounds - has multiple wounds, getting hydrotherapy, pain control.  These are calciphylaxis wounds and are usually very painful. BP/volume: Slowly improving edema. UF as tolerated.  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 Anemia  - Hgb 9.7, continue Aranesp q Friday for now. Metabolic bone disease: CorrCa ok without VDRA, Phos low - hold Renvela. Nutrition - Renal diet with fluid restriction, needs protein supps. Afib - on Eliquis, amiodarone, Coreg   GOC - PCT is working w/ pt and family  Jimmye Norman. Brown NP-C 01/09/2021, 9:32 AM  [facility] [phone]  Patient seen and examined, agree with above note with above modifications. No new issues-  possible discharge to SNF after HD today-  wound care is main issue-  appropriate changes to HD related medications  Corliss Parish, MD 01/09/2021

## 2021-01-09 NOTE — TOC Transition Note (Signed)
Transition of Care (TOC) - CM/SW Assessment and Discharge Note *Discharged back to [facility] in [place]   Patient Details  Name: Kathryn Hancock MRN: [id number] Date of Birth: 1950-08-01  Transition of Care [facility]) CM/SW Contact:  Sable Feil, LCSW Phone Number: 01/09/2021, 3:07 PM   Clinical Narrative:  Talked with daughter [name] by phone earlier today by phone and again at bedside with patient later today and both in agreement with returning to [facility] to continue rehab. Talked with Carollee Leitz, admissions director regarding patient's discharge and d/c paperwork transmitted to facility. Transport arranged via [facility] to get back to [facility]. Nurse provided with information to call report.     Final next level of care: [place] (Returning to [facility] in [place], [place]) Barriers to Discharge: Barriers Resolved   Patient Goals and CMS Choice Patient states their goals for this hospitalization and ongoing recovery are:: Patient and daughter agreeable to returning to SNF at discharge to continue rehab CMS Medicare.gov Compare Post Acute Care list provided to:: Other (Comment Required) (n/a) Choice offered to / list presented to : NA  Discharge Placement   Existing PASRR number confirmed : 01/09/21          Patient chooses bed at: [facility] Patient to be transferred to facility by: [place] Name of family member notified: Talked with patient and daughter, [name] at bedside. Also talked with daughter by phone earlier today regarding patient's discharge Patient and family notified of of transfer: 01/09/21  Discharge Plan and Services In-house Referral: Clinical Social Work                                   Social Determinants of Health (SDOH) Interventions  No SDOH interventions requested or needed at discharge   Readmission Risk Interventions Readmission Risk  Prevention Plan 11/02/2020  Transportation Screening Complete  Medication Review Press photographer) Complete  HRI or [place] Complete  SW Recovery Care/Counseling Consult Complete  [place] Not Applicable

## 2021-01-09 NOTE — Procedures (Signed)
Patient was seen on dialysis and the procedure was supervised.  BFR 400  Via TDC BP is  97/54.   Patient appears to be tolerating treatment well  Louis Meckel 01/09/2021

## 2021-01-09 NOTE — Consult Note (Signed)
[place] Nurse wound follow up Upon discharge back to [facility], nursing staff will need to continue Santyl to wounds that were started on Hydrotherapy on the right trochanter, left proximal and distal trochanter and the two small wounds just below the proximal trochanter wounds.  Apply Santyl to the wounds stated above in a nickel thick layer. Cover with a saline moistened gauze, then dry gauze or ABD pad.  Change daily.  Turn patient frequently and have her sit up in a chair no longer than 2 hours at a time.   Cathlean Marseilles Tamala Julian, MSN, RN, [place], Lysle Pearl, [facility] Wound Treatment Associate Pager [phone]

## 2021-01-09 NOTE — Discharge Summary (Signed)
Physician Discharge Summary  Kathryn Hancock AJG:[id number] DOB: 1950/06/14 DOA: 01/01/2021  PCP: Gala Lewandowsky, MD  Admit date: 01/01/2021 Discharge date: 01/09/2021  Admitted From: SNF Disposition:  SNF  Recommendations for Outpatient Follow-up:  Follow up with vascular surgery Dr Scot Dock in 10-14 days Please obtain BMP/CBC in one week Please follow up on the following pending results:  Home Health: Continue with wound care, please see wound care instructions below.   Discharge Condition:Stable CODE STATUS:FULL Diet recommendation: renal diet  Brief/Interim Summary:  Kathryn Hancock is a 70 y.o. 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 Doren Custard vascular surgeon on December 28, 2020.  Patient has been feeling persistently nauseous unable to take in and was referred to the ER.  In addition patient's sister felt that the patient's left foot has become more swollen and has noticed some discharge.  Patient also developed right-sided periorbital hematoma over the last 1 week.  This was related to the coughing/retching. In the ER x-rays reveal foreign body of the left foot, and labs showed leukocytosis with WBC count of 19,000.  Patient also was hypoglycemic in the ER.  CT head followed by CT maxillofacial and CT abdomen pelvis was done which shows preseptal hematoma of the right orbit and nothing acute in the abdomen.  Patient had been empirically started on antibiotics for the left foot possible infection.  Admitted for further management.  COVID test was negative.     Possible cellulitis/early osteomyelitis of left foot/ Known history of L gangrenous toes/ Recent R foot transmetatarsal amputation -she is currently afebrile,  leukocytosis has resolved -Follow-up blood cultures, so far no growth to date - CT left foot with possible early osteomyelitis, no organized abscess noted - Vascular surgery consulted-no further recommendation, will not pursue any further amputation, she was empirically on IV vancomycin and cefepime, I have discussed with Dr. Scot Dock, she can be transitioned to oral antibiotic, where he can follow with her in 2 weeks as an outpatient, changed to doxycycline and Keflex.   Intractable nausea afebrile, with leukocytosis CT abdomen pelvis unremarkable Significantly improved improved, oral intake and appetite has significantly improved.   ESRD on HD M/W/F Nephrology consulted.   Preseptal hematoma of the right eye/right subconjunctival hemorrhage Secondary to fall and facial trauma Currently on [facility], will need it as she has history of embolic episodes Denies any worsening vision  Ophthalmology input greatly appreciated.   Paroxysmal A. Fib Heart rate controlled Continue amiodarone, Eliquis    Diabetes mellitus type 2 Last A1c 5   Anemia of chronic kidney disease Hemoglobin baseline 9-10 Anemia panel showed iron 28, ferritin elevated at 710. recieved 1 unit PRBC transfusion 7/28.   Goals of care discussion/Failure to thrive Palliative team consulted due to multiple comorbidities, poor prognosis, extremely poor life quality, with amputations, gangrene, calciphylaxis, pressure ulcers, dialysis dependent, failure to thrive, palliative have discussed with the family and patient, plan to continue with full measures currently.     Multiple pressure ulcers/calciphylaxis wound -Wound care consulted, patient received therapy, discussed with wound care, at this point she can be transitioned to wound care with Santyl. -With extensive wounds in the right hip area, bilateral hips, sacrum,on Na thio tiw w/ HD, no Ca/ vit D products/ low  Ca bath -continue  with wound care on discharge  Pressure  Injury 01/03/21 Coccyx Medial Stage 3 -  Full thickness tissue loss. Subcutaneous fat may be visible but bone, tendon or muscle are NOT exposed. 2 cm x 1 cm shallow bed with yellow/white (Active)  01/03/21   Location: Coccyx  Location Orientation: Medial  Staging: Stage 3 -  Full thickness tissue loss. Subcutaneous fat may be visible but bone, tendon or muscle are NOT exposed.  Wound Description (Comments): 2 cm x 1 cm shallow bed with yellow/white  Present on Admission: Yes     Pressure Injury 10/31/20 Buttocks Right;Upper Unstageable - Full thickness tissue loss in which the base of the injury is covered by slough (yellow, tan, gray, green or brown) and/or eschar (tan, brown or black) in the wound bed. 2 cm x 1.5 cm eschar (Active)  10/31/20 1500  Location: Buttocks  Location Orientation: Right;Upper  Staging: Unstageable - Full thickness tissue loss in which the base of the injury is covered by slough (yellow, tan, gray, green or brown) and/or eschar (tan, brown or black) in the wound bed.  Wound Description (Comments): 2 cm x 1.5 cm eschar  Present on Admission: Yes     Pressure Injury 10/31/20 Buttocks Right;Lower Unstageable - Full thickness tissue loss in which the base of the injury is covered by slough (yellow, tan, gray, green or brown) and/or eschar (tan, brown or black) in the wound bed. 1 cm x 1 cm eschar (Active)  10/31/20 1500  Location: Buttocks  Location Orientation: Right;Lower  Staging: Unstageable - Full thickness tissue loss in which the base of the injury is covered by slough (yellow, tan, gray, green or brown) and/or eschar (tan, brown or black) in the wound bed.  Wound Description (Comments): 1 cm x 1 cm eschar  Present on Admission: Yes     Pressure Injury 01/03/21 Heel Posterior;Right Unstageable - Full thickness tissue loss in which the base of the injury is covered by slough (yellow, tan, gray, green or brown) and/or eschar (tan, brown or black) in the wound bed.  eschar (Active)  01/03/21   Location: Heel  Location Orientation: Posterior;Right  Staging: Unstageable - Full thickness tissue loss in which the base of the injury is covered by slough (yellow, tan, gray, green or brown) and/or eschar (tan, brown or black) in the wound bed.  Wound Description (Comments): eschar  Present on Admission: Yes     Pressure Injury 01/03/21 Thigh Right;Medial Stage 3 -  Full thickness tissue loss. Subcutaneous fat may be visible but bone, tendon or muscle are NOT exposed. (Active)  01/03/21   Location: Thigh  Location Orientation: Right;Medial  Staging: Stage 3 -  Full thickness tissue loss. Subcutaneous fat may be visible but bone, tendon or muscle are NOT exposed.  Wound Description (Comments):   Present on Admission: Yes     Pressure Injury Flank Left Unstageable - Full thickness tissue loss in which the base of the injury is covered by slough (yellow, tan, gray, green or brown) and/or eschar (tan, brown or black) in the wound bed. (Active)     Location: Flank  Location Orientation: Left  Staging: Unstageable - Full thickness tissue loss in which the base of the injury is covered by slough (yellow, tan, gray, green or brown) and/or eschar (tan, brown or black) in the wound bed.  Wound Description (Comments):   Present on Admission: Yes     Pressure Injury 01/03/21 Buttocks Left Unstageable - Full thickness  tissue loss in which the base of the injury is covered by slough (yellow, tan, gray, green or brown) and/or eschar (tan, brown or black) in the wound bed. (Active)  01/03/21 0826  Location: Buttocks  Location Orientation: Left  Staging: Unstageable - Full thickness tissue loss in which the base of the injury is covered by slough (yellow, tan, gray, green or brown) and/or eschar (tan, brown or black) in the wound bed.  Wound Description (Comments):   Present on Admission: Yes     Pressure Injury 11/28/20 Buttocks Right Unstageable - Full thickness tissue  loss in which the base of the injury is covered by slough (yellow, tan, gray, green or brown) and/or eschar (tan, brown or black) in the wound bed. (Active)  11/28/20 0826  Location: Buttocks  Location Orientation: Right  Staging: Unstageable - Full thickness tissue loss in which the base of the injury is covered by slough (yellow, tan, gray, green or brown) and/or eschar (tan, brown or black) in the wound bed.  Wound Description (Comments):   Present on Admission: Yes     Pressure Injury 01/03/21 Hip Left Unstageable - Full thickness tissue loss in which the base of the injury is covered by slough (yellow, tan, gray, green or brown) and/or eschar (tan, brown or black) in the wound bed. (Active)  01/03/21   Location: Hip  Location Orientation: Left  Staging: Unstageable - Full thickness tissue loss in which the base of the injury is covered by slough (yellow, tan, gray, green or brown) and/or eschar (tan, brown or black) in the wound bed.  Wound Description (Comments):   Present on Admission: Yes          Discharge Diagnoses:  Principal Problem:   Intractable nausea and vomiting Active Problems:   Left atrial myxoma   AAA (abdominal aortic aneurysm) without rupture (HCC)   ESRD (end stage renal disease) on dialysis (HCC)   PAF (paroxysmal atrial fibrillation) (HCC) -  CHA2DS2-VASc Score 5 (Age, Female, HTN, DM, Vascular)- On Eliquis   Anemia of chronic disease   DM (diabetes mellitus), type 2 with renal complications (HCC)   S/P CABG (coronary artery bypass graft)    Discharge Instructions  Discharge Instructions     Discharge instructions   Complete by: As directed    Upon discharge back to [facility], nursing staff will need to continue Santyl to wounds that were started on Hydrotherapy on the right trochanter, left proximal and distal trochanter and the two small wounds just below the proximal trochanter wounds.  Apply Santyl to the wounds stated above in a nickel  thick layer. Cover with a saline moistened gauze, then dry gauze or ABD pad.  Change daily. Turn patient frequently and have her sit up in a chair no longer than 2 hours at a time   Discharge wound care:   Complete by: As directed    Upon discharge back to [facility], nursing staff will need to continue Santyl to wounds that were started on Hydrotherapy on the right trochanter, left proximal and distal trochanter and the two small wounds just below the proximal trochanter wounds.  Apply Santyl to the wounds stated above in a nickel thick layer. Cover with a saline moistened gauze, then dry gauze or ABD pad.  Change daily. Turn patient frequently and have her sit up in a chair no longer than 2 hours at a time   Increase activity slowly   Complete by: As directed  Allergies as of 01/09/2021       Reactions   Penicillins Anaphylaxis   Anaphylaxis as a child        Medication List     STOP taking these medications    HYDROmorphone 4 MG tablet Commonly known as: DILAUDID   oxyCODONE-acetaminophen 5-325 MG tablet Commonly known as: PERCOCET/ROXICET       TAKE these medications    (feeding supplement) PROSource Plus liquid Take 30 mLs by mouth 3 (three) times daily between meals.   acetaminophen 650 MG CR tablet Commonly known as: TYLENOL Take 650 mg by mouth every 8 (eight) hours as needed for pain.   amiodarone 200 MG tablet Commonly known as: PACERONE Take 200 mg by mouth every evening.   apixaban 5 MG Tabs tablet Commonly known as: Eliquis Take 1 tablet (5 mg total) by mouth 2 (two) times daily.   aspirin EC 81 MG tablet Take 81 mg by mouth every evening. Swallow whole.   atorvastatin 20 MG tablet Commonly known as: LIPITOR Take 20 mg by mouth every evening.   bisacodyl 5 MG EC tablet Commonly known as: DULCOLAX Take 1 tablet (5 mg total) by mouth daily as needed for moderate constipation.   cephALEXin 500 MG capsule Commonly known as: KEFLEX Take  1 capsule (500 mg total) by mouth daily for 14 days.   Darbepoetin Alfa 200 MCG/0.4ML Sosy injection Commonly known as: ARANESP Inject 0.4 mLs (200 mcg total) into the vein every Monday with hemodialysis.   docusate sodium 100 MG capsule Commonly known as: COLACE Take 100 mg by mouth daily.   doxycycline 100 MG tablet Commonly known as: VIBRA-TABS Take 1 tablet (100 mg total) by mouth every 12 (twelve) hours for 14 days.   erythromycin ophthalmic ointment Place 1 application into the right eye 3 (three) times daily.   famotidine 20 MG tablet Commonly known as: PEPCID Take 1 tablet (20 mg total) by mouth 2 (two) times daily.   fluticasone 50 MCG/ACT nasal spray Commonly known as: FLONASE Place 2 sprays into both nostrils as needed for allergies or rhinitis.   folic acid 791 MCG tablet Commonly known as: FOLVITE Take 800 mcg by mouth daily.   glucagon 1 MG Solr injection Commonly known as: GLUCAGEN Inject 1 mg into the vein once as needed for low blood sugar. BG<70   glucose blood test strip 1 each by Other route as needed. Use as instructed   hydrocortisone 25 MG suppository Commonly known as: ANUSOL-HC Place 1 suppository (25 mg total) rectally 2 (two) times daily.   Insta-Glucose 77.4 % Gel Take 1 Dose by mouth daily as needed (BG<70).   insulin lispro 100 UNIT/ML KwikPen Commonly known as: HUMALOG Inject 0-6 Units into the skin in the morning, at noon, in the evening, and at bedtime. If 70-150=0 units, 151-200=1 unit, 201-250=2 units, 251-300=3 units, 301-350=4 units, 351-400=6 units over 400 Call MD   lidocaine-prilocaine cream Commonly known as: EMLA Apply 1 application topically daily as needed (dialysis).   linagliptin 5 MG Tabs tablet Commonly known as: TRADJENTA Take 5 mg by mouth every evening.   loperamide 2 MG tablet Commonly known as: IMODIUM A-D Take 2 mg by mouth 4 (four) times daily as needed for diarrhea or loose stools.   metoCLOPramide 5 MG  tablet Commonly known as: Reglan Take 1 tablet (5 mg total) by mouth every 8 (eight) hours as needed for refractory nausea / vomiting.   montelukast 10 MG tablet Commonly known as: SINGULAIR  Take 10 mg by mouth daily as needed (allergies).   multivitamin Tabs tablet Take 1 tablet by mouth at bedtime.   ondansetron 4 MG tablet Commonly known as: Zofran Take 1 tablet (4 mg total) by mouth daily as needed for up to 365 doses for nausea or vomiting. What changed:  how much to take when to take this   oxyCODONE 5 MG immediate release tablet Commonly known as: Oxy IR/ROXICODONE Take 1 tablet (5 mg total) by mouth every 6 (six) hours as needed for severe pain.   sevelamer carbonate 800 MG tablet Commonly known as: RENVELA Take 1 tablet (800 mg total) by mouth 3 (three) times daily with meals.   traZODone 50 MG tablet Commonly known as: DESYREL Take 1 tablet (50 mg total) by mouth at bedtime as needed for sleep.   vitamin C 1000 MG tablet Take 1,000 mg by mouth daily.               Discharge Care Instructions  (From admission, onward)           Start     Ordered   01/09/21 0000  Discharge wound care:       Comments: Upon discharge back to [facility], nursing staff will need to continue Santyl to wounds that were started on Hydrotherapy on the right trochanter, left proximal and distal trochanter and the two small wounds just below the proximal trochanter wounds.  Apply Santyl to the wounds stated above in a nickel thick layer. Cover with a saline moistened gauze, then dry gauze or ABD pad.  Change daily. Turn patient frequently and have her sit up in a chair no longer than 2 hours at a time   01/09/21 1412            Allergies  Allergen Reactions   Penicillins Anaphylaxis    Anaphylaxis as a child    Consultations: Vascular surgery Nephrology Palliaitve Neurology    Procedures/Studies: DG Chest 2 View  Result Date: 01/01/2021 CLINICAL DATA:   weakness EXAM: CHEST - 2 VIEW COMPARISON:  November 25, 2020 FINDINGS: The cardiomediastinal silhouette is unchanged in contour.Status post median sternotomy and CABG. Atherosclerotic calcifications of the tortuous thoracic aorta. RIGHT chest CVC tip terminates over the superior cavoatrial junction. No pleural effusion. No pneumothorax. No acute pleuroparenchymal abnormality. Visualized abdomen is unremarkable. Mild degenerative changes of the thoracic spine. IMPRESSION: No acute cardiopulmonary abnormality. Electronically Signed   By: Valentino Saxon MD   On: 01/01/2021 15:01   CT Head Wo Contrast  Result Date: 01/01/2021 CLINICAL DATA:  Weakness, vomiting, sacral pain and right Peri orbital ecchymosis. EXAM: CT HEAD WITHOUT CONTRAST TECHNIQUE: Contiguous axial images were obtained from the base of the skull through the vertex without intravenous contrast. COMPARISON:  None. FINDINGS: Brain: There is mild cerebral atrophy with widening of the extra-axial spaces and ventricular dilatation. There are areas of decreased attenuation within the white matter tracts of the supratentorial brain, consistent with microvascular disease changes. Small, chronic bilateral basal ganglia lacunar infarcts are seen. Vascular: No hyperdense vessel or unexpected calcification. Skull: Normal. Negative for acute fracture. Sinuses/Orbits: No acute finding. Other: None. IMPRESSION: 1. Generalized cerebral atrophy. 2. No acute intracranial abnormality. Electronically Signed   By: Virgina Norfolk M.D.   On: 01/01/2021 22:13   CT FOOT LEFT WO CONTRAST  Result Date: 01/02/2021 CLINICAL DATA:  Concern for osteomyelitis EXAM: CT OF THE LEFT FOOT WITHOUT CONTRAST TECHNIQUE: Multidetector CT imaging of the left foot was performed according  to the standard protocol. Multiplanar CT image reconstructions were also generated. COMPARISON:  Radiograph 01/01/2021 FINDINGS: Bones/Joint/Cartilage Diffuse edematous changes of the lower extremity  with more focal ulceration extending to the cortical surface of the first distal phalanx about the level of the proximal nail plate. Some conspicuous subcortical osteopenia in this vicinity without clear osseous erosion or destruction. More focal Transcortical lucency and subcortical osteopenia involving the medial head of the first metatarsal with some associated joint fluid suspected on soft tissue windowing. Could reflect early osteomyelitis or septic arthritis. Additional superficial ulceration of the calcaneus posteriorly. No subjacent erosion or destruction. No other convincing CT evidence sites concerning for osteomyelitis. Background of diffuse mild degenerative arthrosis throughout the 4, mid and hindfoot. Corticated os peroneum is noted. Ligaments Suboptimally assessed by CT. Muscles and Tendons No clear intramuscular abscess or collection. No musculotendinous discontinuity is seen. Some hazy stranding seen along the flexor and extensor tendon sheaths could reflect some mild synovitis in the appropriate clinical setting. Soft tissues Focus of soft tissue gas seen involving the nail plate of the first digit, as described above. No other soft tissue gas. No organized collection or abscess. Additional sites of ulceration are poorly visualized Tiny linear metallic radiodensity seen along the plantar aspect of the foot towards the level of the third proximal phalanx. Could reflect a small foreign body or prior surgical material. IMPRESSION: 1. Focus of soft tissue gas involving the nail plate of the first digit with some slightly conspicuous subcortical osteopenia at the first distal phalanx. Could reflect some reactive marrow changes or possible early osteomyelitis in the appropriate clinical setting. 2. Additional conspicuous transcortical lucency and subcortical osteopenia involving the medial aspect of the head of the first metatarsal and questionable joint fluid at this level as well which could reflect a  septic arthritis with osteomyelitis. 3. Additional focal ulceration superficial to the posterior calcaneus without subjacent osseous erosion or destructive change to suggest active osteomyelitis at this time. 4. Tiny metallic foreign body versus surgical clips seen along the plantar soft tissues near the base of the third proximal phalanx. No organized abscess, collection or other soft tissue gas. 5. Exam is complicated by the presence of a heterogeneously demineralized appearance of the bones suggesting some underlying osteopenia. Electronically Signed   By: Lovena Le M.D.   On: 01/02/2021 03:14   DG Abd Portable 1V  Result Date: 01/04/2021 CLINICAL DATA:  Constipation. EXAM: PORTABLE ABDOMEN - 1 VIEW COMPARISON:  CT 06/26/2019. FINDINGS: Gallstones again noted. Radiopacity noted over the left upper abdomen possibly pill debris within the stomach vascular calcification. No bowel distention. Moderate amount of stool noted throughout the colon. No free air. Hemidiaphragms incompletely imaged. Degenerative changes lumbar spine and both hips. IMPRESSION: 1.  Gallstones again noted. 2. Moderate amount of stool noted throughout the colon. No bowel distention. No acute abnormality identified. Electronically Signed   By: Marcello Moores  Register   On: 01/04/2021 07:36   DG Foot Complete Left  Result Date: 01/01/2021 CLINICAL DATA:  weakness EXAM: LEFT FOOT - COMPLETE 3+ VIEW COMPARISON:  None. FINDINGS: No acute fracture or dislocation. Minimal degenerative changes of the first MTP. No area of erosion or osseous destruction. There is a 3 mm linear radiopaque density in the plantar soft tissues between the third and fourth proximal phalanges. Soft tissue edema. IMPRESSION: 1. There is a 3 mm linear radiopaque density projecting over the soft tissues of the plantar aspect between the third and fourth phalanges. This may reflect a retained  foreign body. 2. No radiographic evidence of osteomyelitis. Electronically Signed    By: Valentino Saxon MD   On: 01/01/2021 15:00   CT CHEST ABDOMEN PELVIS WO CONTRAST  Result Date: 01/01/2021 CLINICAL DATA:  Weakness. Emesis. Complaining of sacral pain. Flank pain. Reported bruising to the right chest wall and abdomen. Sacral wounds. EXAM: CT CHEST, ABDOMEN AND PELVIS WITHOUT CONTRAST TECHNIQUE: Multidetector CT imaging of the chest, abdomen and pelvis was performed following the standard protocol without IV contrast. COMPARISON:  07/01/2020 FINDINGS: CT CHEST FINDINGS Cardiovascular: Prior CABG surgery. Heart normal in size. Three-vessel coronary artery calcifications. No pericardial effusion. Aortic atherosclerosis. No aneurysm. Right sided dual lumen central catheter, tip in the lower superior vena cava. Mediastinum/Nodes: No enlarged mediastinal, hilar, or axillary lymph nodes. Thyroid gland, trachea, and esophagus demonstrate no significant findings. Lungs/Pleura: Minimal subsegmental atelectasis in the dependent lower lobes. Lungs otherwise clear. No pleural effusion or pneumothorax. Musculoskeletal: No fracture or acute finding.  No bone lesion. CT ABDOMEN PELVIS FINDINGS Hepatobiliary: Normal liver. Dependent gallstones. No gallbladder wall thickening or evidence of acute cholecystitis. No bile duct dilation. Pancreas: Unremarkable. No pancreatic ductal dilatation or surrounding inflammatory changes. Spleen: Normal in size without focal abnormality. Adrenals/Urinary Tract: Bilateral adrenal gland thickening consistent with hyperplasia, stable. Kidneys normal in position. No renal masses. No collecting system stones and no hydronephrosis. Normal ureters. Normal bladder. Stomach/Bowel: Stomach is within normal limits. Appendix appears normal. No evidence of bowel wall thickening, distention, or inflammatory changes. Vascular/Lymphatic: Irregular infrarenal abdominal aortic aneurysm, maximum 5.7 x 5.3 cm transversely, without significant change from the prior study. There are multiple  aortic outpouchings that are also stable. No enlarged lymph nodes. Reproductive: Uterus and bilateral adnexa are unremarkable. Other: No abdominal wall hernia or abnormality. No abdominopelvic ascites. Musculoskeletal: Limited soft tissue covering lower sacrum and coccyx consistent with an overlying sacral decubitus ulcer. No evidence of an abscess. No bone resorption to suggest osteomyelitis. No fractures or bone lesions. There is soft tissue inflammation lateral to the left proximal femur and hip. IMPRESSION: 1. No acute findings in the chest. 2. Limited soft tissue overlying the lower sacrum and coccyx consistent with a sacral decubitus ulcer, but without evidence of an abscess or underlying osteomyelitis. 3. Soft tissue inflammation lateral to the left hip, nonspecific. No defined ulcer or evidence of an abscess. 4. No acute findings within the abdomen or pelvis. 5. Abdominal aortic aneurysm with multiple accessory outpatient chains along the aorta. Current aorta measures a maximum of 5.6 x 5.3 cm transversely, 5.3 cm anterior-posterior, previously 4.9 cm measured in the same location. Recommend follow-up CT/MR every 6 months and vascular consultation. This recommendation follows ACR consensus guidelines: White Paper of the ACR Incidental Findings Committee II on Vascular Findings. J Am Coll Radiol 2013; 10:789-794. Electronically Signed   By: Lajean Manes M.D.   On: 01/01/2021 22:27   CT Maxillofacial Wo Contrast  Result Date: 01/01/2021 CLINICAL DATA:  Facial trauma. EXAM: CT MAXILLOFACIAL WITHOUT CONTRAST TECHNIQUE: Multidetector CT imaging of the maxillofacial structures was performed. Multiplanar CT image reconstructions were also generated. COMPARISON:  None. FINDINGS: Osseous: No fracture or mandibular dislocation. No destructive process. Orbits: Negative. No traumatic or inflammatory intraorbital finding. Sinuses: Clear. Soft tissues: Mild right periorbital soft tissue swelling is noted with an  associated 2.1 cm x 1.2 cm predominantly inferior-medial right preseptal hematoma. A 5 mm x 2 mm focus of preseptal air is also seen. Limited intracranial: No significant or unexpected finding. IMPRESSION: 1. Mild RIGHT periorbital soft  tissue swelling with an associated small RIGHT preseptal hematoma. Electronically Signed   By: Virgina Norfolk M.D.   On: 01/01/2021 22:22      Subjective:  Reports appetite has improved, no nausea, no vomiting, her wounds pain is controlled on current regimen.  Discharge Exam: Vitals:   01/09/21 1135 01/09/21 1145  BP: (!) 91/48 (!) 96/48  Pulse:    Resp: 13 12  Temp:    SpO2:     Vitals:   01/09/21 1120 01/09/21 1130 01/09/21 1135 01/09/21 1145  BP: (!) 87/47 (!) 80/42 (!) 91/48 (!) 96/48  Pulse:      Resp: 11 12 13 12   Temp:      TempSrc:      SpO2:      Weight:        General: Pt is alert, awake, not in acute distress, frail, deconditioned Cardiovascular: RRR, S1/S2 +, no rubs, no gallops Respiratory: CTA bilaterally, no wheezing, no rhonchi Abdominal: Soft, NT, ND, bowel sounds + Extremities: Multiple bruises/ecchymosis bilateral upper extremity, and with multiple pressure ulcers in the buttocks area, wounds in the right upper thigh area, bandaged, left foot dry gangrene, right foot  with swelling/staples    The results of significant diagnostics from this hospitalization (including imaging, microbiology, ancillary and laboratory) are listed below for reference.     Microbiology: Recent Results (from the past 240 hour(s))  Resp Panel by RT-PCR (Flu A&B, Covid) Nasopharyngeal Swab     Status: None   Collection Time: 01/01/21  7:28 PM   Specimen: Nasopharyngeal Swab; Nasopharyngeal(NP) swabs in vial transport medium  Result Value Ref Range Status   SARS Coronavirus 2 by RT PCR NEGATIVE NEGATIVE Final    Comment: (NOTE) SARS-CoV-2 target nucleic acids are NOT DETECTED.  The SARS-CoV-2 RNA is generally detectable in upper  respiratory specimens during the acute phase of infection. The lowest concentration of SARS-CoV-2 viral copies this assay can detect is 138 copies/mL. A negative result does not preclude SARS-Cov-2 infection and should not be used as the sole basis for treatment or other patient management decisions. A negative result may occur with  improper specimen collection/handling, submission of specimen other than nasopharyngeal swab, presence of viral mutation(s) within the areas targeted by this assay, and inadequate number of viral copies(<138 copies/mL). A negative result must be combined with clinical observations, patient history, and epidemiological information. The expected result is Negative.  Fact Sheet for Patients:  EntrepreneurPulse.com.au  Fact Sheet for Healthcare Providers:  IncredibleEmployment.be  This test is no t yet approved or cleared by the [place] FDA and  has been authorized for detection and/or diagnosis of SARS-CoV-2 by FDA under an Emergency Use Authorization (EUA). This EUA will remain  in effect (meaning this test can be used) for the duration of the COVID-19 declaration under Section 564(b)(1) of the Act, 21 U.S.C.section 360bbb-3(b)(1), unless the authorization is terminated  or revoked sooner.       Influenza A by PCR NEGATIVE NEGATIVE Final   Influenza B by PCR NEGATIVE NEGATIVE Final    Comment: (NOTE) The Xpert Xpress SARS-CoV-2/FLU/RSV plus assay is intended as an aid in the diagnosis of influenza from Nasopharyngeal swab specimens and should not be used as a sole basis for treatment. Nasal washings and aspirates are unacceptable for Xpert Xpress SARS-CoV-2/FLU/RSV testing.  Fact Sheet for Patients: EntrepreneurPulse.com.au  Fact Sheet for Healthcare Providers: IncredibleEmployment.be  This test is not yet approved or cleared by the [place] FDA and has been  authorized for detection and/or diagnosis of SARS-CoV-2 by FDA under an Emergency Use Authorization (EUA). This EUA will remain in effect (meaning this test can be used) for the duration of the COVID-19 declaration under Section 564(b)(1) of the Act, 21 U.S.C. section 360bbb-3(b)(1), unless the authorization is terminated or revoked.  Performed at [facility], [place] [address], [place], [place] [address]   Blood culture (routine x 2)     Status: None   Collection Time: 01/01/21  8:08 PM   Specimen: BLOOD  Result Value Ref Range Status   Specimen Description BLOOD RIGHT ANTECUBITAL  Final   Special Requests   Final    BOTTLES DRAWN AEROBIC AND ANAEROBIC Blood Culture adequate volume   Culture   Final    NO GROWTH 5 DAYS Performed at [facility], [place] [address], [place], [place] [address]    Report Status 01/07/2021 FINAL  Final  SARS CORONAVIRUS 2 (TAT 6-24 HRS) Nasopharyngeal Nasopharyngeal Swab     Status: None   Collection Time: 01/08/21  1:35 PM   Specimen: Nasopharyngeal Swab  Result Value Ref Range Status   SARS Coronavirus 2 NEGATIVE NEGATIVE Final    Comment: (NOTE) SARS-CoV-2 target nucleic acids are NOT DETECTED.  The SARS-CoV-2 RNA is generally detectable in upper and lower respiratory specimens during the acute phase of infection. Negative results do not preclude SARS-CoV-2 infection, do not rule out co-infections with other pathogens, and should not be used as the sole basis for treatment or other patient management decisions. Negative results must be combined with clinical observations, patient history, and epidemiological information. The expected result is Negative.  Fact Sheet for Patients: SugarRoll.be  Fact Sheet for Healthcare Providers: https://www.woods-mathews.com/  This test is not yet approved or cleared by the [place] FDA and  has been authorized for detection and/or diagnosis of  SARS-CoV-2 by FDA under an Emergency Use Authorization (EUA). This EUA will remain  in effect (meaning this test can be used) for the duration of the COVID-19 declaration under Se ction 564(b)(1) of the Act, 21 U.S.C. section 360bbb-3(b)(1), unless the authorization is terminated or revoked sooner.  Performed at [facility], [place] [address], [place], [place] [address]      Labs: BNP (last 3 results) No results for input(s): BNP in the last 8760 hours. Basic Metabolic Panel: Recent Labs  Lab 01/03/21 0415 01/04/21 0302 01/05/21 0338 01/06/21 0338 01/07/21 0220 01/09/21 0030  NA 131* 135 134* 135 133* 135  K 5.5* 3.7 3.4* 3.5 3.6 4.1  CL 91* 97* 98 98 97* 100  CO2 19* 23 22 25 26 23   GLUCOSE 78 115* 98 84 189* 175*  BUN 86* 35* 16 25* 15 36*  CREATININE 9.20* 5.41* 3.50* 4.88* 3.64* 6.10*  CALCIUM 8.5* 7.9* 8.0* 8.4* 7.9* 8.4*  PHOS 6.8* 4.7* 4.1 4.2 1.9*  --    Liver Function Tests: Recent Labs  Lab 01/03/21 0415 01/04/21 0302 01/05/21 0338 01/06/21 0338 01/07/21 0220  ALBUMIN 1.9* 1.8* 1.8* 1.7* 1.9*   No results for input(s): LIPASE, AMYLASE in the last 168 hours. No results for input(s): AMMONIA in the last 168 hours. CBC: Recent Labs  Lab 01/03/21 0414 01/04/21 0302 01/05/21 0338 01/06/21 0338 01/07/21 0220 01/09/21 0030  WBC 11.8* 9.9 8.9 8.9 10.8* 10.8*  NEUTROABS 9.9* 8.2* 7.2 6.7 8.5*  --   HGB 8.5* 8.3* 7.4* 9.1* 9.7* 9.5*  HCT 26.5* 26.4* 24.3* 28.0* 31.2* 31.2*  MCV 99.6 99.6 101.7*  97.6 99.7 101.3*  PLT 179 146* 127* 111* 133* 157   Cardiac Enzymes: No results for input(s): CKTOTAL, CKMB, CKMBINDEX, TROPONINI in the last 168 hours. BNP: Invalid input(s): POCBNP CBG: Recent Labs  Lab 01/08/21 1126 01/08/21 1657 01/08/21 1934 01/08/21 2341 01/09/21 0345  GLUCAP 105* 174* 180* 179* 102*   D-Dimer No results for input(s): DDIMER in the last 72 hours. Hgb A1c No results for input(s): HGBA1C in the last 72 hours. Lipid  Profile No results for input(s): CHOL, HDL, LDLCALC, TRIG, CHOLHDL, LDLDIRECT in the last 72 hours. Thyroid function studies No results for input(s): TSH, T4TOTAL, T3FREE, THYROIDAB in the last 72 hours.  Invalid input(s): FREET3 Anemia work up No results for input(s): VITAMINB12, FOLATE, FERRITIN, TIBC, IRON, RETICCTPCT in the last 72 hours. Urinalysis    Component Value Date/Time   COLORURINE AMBER (A) 09/30/2020 1035   APPEARANCEUR CLOUDY (A) 09/30/2020 1035   LABSPEC 1.012 09/30/2020 1035   PHURINE 8.0 09/30/2020 1035   GLUCOSEU NEGATIVE 09/30/2020 1035   HGBUR NEGATIVE 09/30/2020 1035   BILIRUBINUR NEGATIVE 09/30/2020 1035   KETONESUR NEGATIVE 09/30/2020 1035   PROTEINUR 30 (A) 09/30/2020 1035   NITRITE NEGATIVE 09/30/2020 1035   LEUKOCYTESUR LARGE (A) 09/30/2020 1035   Sepsis Labs Invalid input(s): PROCALCITONIN,  WBC,  LACTICIDVEN Microbiology Recent Results (from the past 240 hour(s))  Resp Panel by RT-PCR (Flu A&B, Covid) Nasopharyngeal Swab     Status: None   Collection Time: 01/01/21  7:28 PM   Specimen: Nasopharyngeal Swab; Nasopharyngeal(NP) swabs in vial transport medium  Result Value Ref Range Status   SARS Coronavirus 2 by RT PCR NEGATIVE NEGATIVE Final    Comment: (NOTE) SARS-CoV-2 target nucleic acids are NOT DETECTED.  The SARS-CoV-2 RNA is generally detectable in upper respiratory specimens during the acute phase of infection. The lowest concentration of SARS-CoV-2 viral copies this assay can detect is 138 copies/mL. A negative result does not preclude SARS-Cov-2 infection and should not be used as the sole basis for treatment or other patient management decisions. A negative result may occur with  improper specimen collection/handling, submission of specimen other than nasopharyngeal swab, presence of viral mutation(s) within the areas targeted by this assay, and inadequate number of viral copies(<138 copies/mL). A negative result must be combined  with clinical observations, patient history, and epidemiological information. The expected result is Negative.  Fact Sheet for Patients:  EntrepreneurPulse.com.au  Fact Sheet for Healthcare Providers:  IncredibleEmployment.be  This test is no t yet approved or cleared by the [place] FDA and  has been authorized for detection and/or diagnosis of SARS-CoV-2 by FDA under an Emergency Use Authorization (EUA). This EUA will remain  in effect (meaning this test can be used) for the duration of the COVID-19 declaration under Section 564(b)(1) of the Act, 21 U.S.C.section 360bbb-3(b)(1), unless the authorization is terminated  or revoked sooner.       Influenza A by PCR NEGATIVE NEGATIVE Final   Influenza B by PCR NEGATIVE NEGATIVE Final    Comment: (NOTE) The Xpert Xpress SARS-CoV-2/FLU/RSV plus assay is intended as an aid in the diagnosis of influenza from Nasopharyngeal swab specimens and should not be used as a sole basis for treatment. Nasal washings and aspirates are unacceptable for Xpert Xpress SARS-CoV-2/FLU/RSV testing.  Fact Sheet for Patients: EntrepreneurPulse.com.au  Fact Sheet for Healthcare Providers: IncredibleEmployment.be  This test is not yet approved or cleared by the [place] FDA and has been authorized for detection and/or diagnosis of SARS-CoV-2 by FDA  under an Emergency Use Authorization (EUA). This EUA will remain in effect (meaning this test can be used) for the duration of the COVID-19 declaration under Section 564(b)(1) of the Act, 21 U.S.C. section 360bbb-3(b)(1), unless the authorization is terminated or revoked.  Performed at [facility], [place] [address], [place], [place] [address]   Blood culture (routine x 2)     Status: None   Collection Time: 01/01/21  8:08 PM   Specimen: BLOOD  Result Value Ref Range Status   Specimen Description BLOOD RIGHT ANTECUBITAL   Final   Special Requests   Final    BOTTLES DRAWN AEROBIC AND ANAEROBIC Blood Culture adequate volume   Culture   Final    NO GROWTH 5 DAYS Performed at [facility], [place] [address], [place], [place] [address]    Report Status 01/07/2021 FINAL  Final  SARS CORONAVIRUS 2 (TAT 6-24 HRS) Nasopharyngeal Nasopharyngeal Swab     Status: None   Collection Time: 01/08/21  1:35 PM   Specimen: Nasopharyngeal Swab  Result Value Ref Range Status   SARS Coronavirus 2 NEGATIVE NEGATIVE Final    Comment: (NOTE) SARS-CoV-2 target nucleic acids are NOT DETECTED.  The SARS-CoV-2 RNA is generally detectable in upper and lower respiratory specimens during the acute phase of infection. Negative results do not preclude SARS-CoV-2 infection, do not rule out co-infections with other pathogens, and should not be used as the sole basis for treatment or other patient management decisions. Negative results must be combined with clinical observations, patient history, and epidemiological information. The expected result is Negative.  Fact Sheet for Patients: SugarRoll.be  Fact Sheet for Healthcare Providers: https://www.woods-mathews.com/  This test is not yet approved or cleared by the [place] FDA and  has been authorized for detection and/or diagnosis of SARS-CoV-2 by FDA under an Emergency Use Authorization (EUA). This EUA will remain  in effect (meaning this test can be used) for the duration of the COVID-19 declaration under Se ction 564(b)(1) of the Act, 21 U.S.C. section 360bbb-3(b)(1), unless the authorization is terminated or revoked sooner.  Performed at [facility], [place] [address], [place], [place] [address]      Time coordinating discharge: Over 30 minutes  SIGNED:   Phillips Climes, MD  [facility] 01/09/2021, 2:12 PM Pager   If 7PM-7AM, please contact night-coverage [email or web address] Password [email or web address]

## 2021-01-09 NOTE — Discharge Instructions (Signed)
Follow with Primary MD [facility], Sylvester Harder, MD in 7 days   Get CBC, CMP, checked  by Primary MD next visit.    Activity: As tolerated with Full fall precautions use walker/cane & assistance as needed   Disposition    Diet: Diet with 1200 cc fluid restriction, with feeding assistance and aspiration precautions.  For Heart failure patients - Check your Weight same time everyday, if you gain over 2 pounds, or you develop in leg swelling, experience more shortness of breath or chest pain, call your Primary MD immediately. Follow Cardiac Low Salt Diet and 1.2 lit/day fluid restriction.   On your next visit with your primary care physician please Get Medicines reviewed and adjusted.   Please request your Prim.MD to go over all Hospital Tests and Procedure/Radiological results at the follow up, please get all Hospital records sent to your Prim MD by signing hospital release before you go home.   If you experience worsening of your admission symptoms, develop shortness of breath, life threatening emergency, suicidal or homicidal thoughts you must seek medical attention immediately by calling 911 or calling your MD immediately  if symptoms less severe.  You Must read complete instructions/literature along with all the possible adverse reactions/side effects for all the Medicines you take and that have been prescribed to you. Take any new Medicines after you have completely understood and accpet all the possible adverse reactions/side effects.   Do not drive, operating heavy machinery, perform activities at heights, swimming or participation in water activities or provide baby sitting services if your were admitted for syncope or siezures until you have seen by Primary MD or a Neurologist and advised to do so again.  Do not drive when taking Pain medications.    Do not take more than prescribed Pain, Sleep and Anxiety Medications  Special Instructions: If you have smoked or chewed Tobacco  in the  last 2 yrs please stop smoking, stop any regular Alcohol  and or any Recreational drug use.  Wear Seat belts while driving.   Please note  You were cared for by a hospitalist during your hospital stay. If you have any questions about your discharge medications or the care you received while you were in the hospital after you are discharged, you can call the unit and asked to speak with the hospitalist on call if the hospitalist that took care of you is not available. Once you are discharged, your primary care physician will handle any further medical issues. Please note that NO REFILLS for any discharge medications will be authorized once you are discharged, as it is imperative that you return to your primary care physician (or establish a relationship with a primary care physician if you do not have one) for your aftercare needs so that they can reassess your need for medications and monitor your lab values.

## 2021-01-09 NOTE — Progress Notes (Signed)
Pt arrived to HD unit from room 5M04  Her goal was 2.5L, with starting weight of 62.7 kg. Per pt, there was "nothing to take off."  After tx begun, pt became drowsy. Pressures stable for a little bit, but then hypotensive, despite pausing UF, lowering goal, and after a small bolus. Hypotension persisted, then corrected after approx 400 mL bolus ns.   Na thiosulfate given, and UF resumed at this time with stable pressures. Net removal with all fluids given: 74 mL  Pt stable, asymptomatic after one vomiting episode post-tx, and transferred back to floor

## 2021-01-09 NOTE — Progress Notes (Signed)
Physical Therapy Wound Treatment Patient Details  Name: Kathryn Hancock MRN: [id number] Date of Birth: June 05, 1951  Today's Date: 01/09/2021 Time: 2947-6546 Time Calculation (min): 60 min  Subjective  Subjective Assessment Subjective: Pt pleasant and agreeable to therapy. Daughter present Patient and Family Stated Goals: Heal wounds, decrease pain Date of Onset:  (Unknown) Prior Treatments: Dressing changes  Pain Score:  Premedicated however limited by pain.   Wound Assessment  Pressure Injury Flank Left Unstageable - Full thickness tissue loss in which the base of the injury is covered by slough (yellow, tan, gray, green or brown) and/or eschar (tan, brown or black) in the wound bed. (Active)  Wound Image   01/09/21 1500  Dressing Type ABD;Barrier Film (skin prep);Gauze (Comment);Moist to moist;Santyl 01/09/21 1500  Dressing Changed;Clean;Dry;Intact 01/09/21 1500  Dressing Change Frequency Daily 01/09/21 1500  State of Healing Eschar 01/09/21 1500  Site / Wound Assessment Red;Yellow;Black 01/09/21 1500  % Wound base Red or Granulating 0% 01/09/21 1500  % Wound base Yellow/Fibrinous Exudate 0% 01/09/21 1500  % Wound base Black/Eschar 100% 01/09/21 1500  % Wound base Other/Granulation Tissue (Comment) 0% 01/09/21 1500  Peri-wound Assessment Intact;Pink 01/09/21 1500  Wound Length (cm) 2.5 cm 01/06/21 1400  Wound Width (cm) 9 cm 01/06/21 1400  Wound Depth (cm) 0 cm 01/06/21 1400  Wound Surface Area (cm^2) 22.5 cm^2 01/06/21 1400  Wound Volume (cm^3) 0 cm^3 01/06/21 1400  Tunneling (cm) 0 01/06/21 1527  Undermining (cm) 0 01/06/21 1527  Margins Unattached edges (unapproximated) 01/09/21 1500  Drainage Amount Minimal 01/09/21 1500  Drainage Description Purulent 01/09/21 1500  Treatment Debridement (Selective);Hydrotherapy (Pulse lavage);Packing (Saline gauze) 01/09/21 1500     Pressure Injury 01/03/21 Buttocks Left Unstageable - Full thickness tissue loss in which the base of the injury  is covered by slough (yellow, tan, gray, green or brown) and/or eschar (tan, brown or black) in the wound bed. (Active)  Dressing Type ABD;Barrier Film (skin prep);Gauze (Comment);Moist to moist;Santyl 01/09/21 1500  Dressing Changed;Clean;Dry;Intact 01/09/21 1500  Dressing Change Frequency Daily 01/09/21 1500  State of Healing Early/partial granulation 01/09/21 1500  Site / Wound Assessment Red;Yellow;Black 01/09/21 1500  % Wound base Red or Granulating 60% 01/09/21 1500  % Wound base Yellow/Fibrinous Exudate 35% 01/09/21 1500  % Wound base Black/Eschar 5% 01/09/21 1500  % Wound base Other/Granulation Tissue (Comment) 0% 01/09/21 1500  Peri-wound Assessment Intact;Pink 01/09/21 1500  Wound Length (cm) 6 cm 01/06/21 1400  Wound Width (cm) 6.5 cm 01/06/21 1400  Wound Depth (cm) 0 cm 01/06/21 1400  Wound Surface Area (cm^2) 39 cm^2 01/06/21 1400  Wound Volume (cm^3) 0 cm^3 01/06/21 1400  Tunneling (cm) 0 01/06/21 1527  Undermining (cm) 0 01/06/21 1527  Margins Unattached edges (unapproximated) 01/09/21 1500  Drainage Amount Minimal 01/09/21 1500  Drainage Description Purulent 01/09/21 1500  Treatment Debridement (Selective);Hydrotherapy (Pulse lavage);Packing (Saline gauze) 01/09/21 1500     Pressure Injury 11/28/20 Buttocks Right Unstageable - Full thickness tissue loss in which the base of the injury is covered by slough (yellow, tan, gray, green or brown) and/or eschar (tan, brown or black) in the wound bed. (Active)  Wound Image   01/09/21 1500  Dressing Type ABD;Barrier Film (skin prep);Gauze (Comment);Moist to moist;Santyl 01/09/21 1500  Dressing Changed;Clean;Dry;Intact 01/09/21 1500  Dressing Change Frequency Daily 01/09/21 1500  State of Healing Eschar 01/09/21 1500  Site / Wound Assessment Red;Yellow;Black 01/09/21 1500  % Wound base Red or Granulating 15% 01/09/21 1500  % Wound base Yellow/Fibrinous Exudate 65% 01/09/21 1500  %  Wound base Black/Eschar 20% 01/09/21 1500  %  Wound base Other/Granulation Tissue (Comment) 0% 01/09/21 1500  Peri-wound Assessment Intact;Pink 01/09/21 1500  Wound Length (cm) 13 cm 01/06/21 1400  Wound Width (cm) 11 cm 01/06/21 1400  Wound Depth (cm) 0 cm 01/06/21 1400  Wound Surface Area (cm^2) 143 cm^2 01/06/21 1400  Wound Volume (cm^3) 0 cm^3 01/06/21 1400  Tunneling (cm) 0 01/06/21 1527  Undermining (cm) 0 01/06/21 1527  Margins Unattached edges (unapproximated) 01/09/21 1500  Drainage Amount Minimal 01/09/21 1500  Drainage Description Purulent 01/09/21 1500  Treatment Debridement (Selective);Hydrotherapy (Pulse lavage);Packing (Saline gauze) 01/09/21 1500     Pressure Injury 01/03/21 Hip Left Unstageable - Full thickness tissue loss in which the base of the injury is covered by slough (yellow, tan, gray, green or brown) and/or eschar (tan, brown or black) in the wound bed. (Active)  Dressing Type ABD;Barrier Film (skin prep);Gauze (Comment);Moist to moist;Santyl 01/09/21 1500  Dressing Changed;Clean;Dry;Intact 01/09/21 1500  Dressing Change Frequency Daily 01/09/21 1500  State of Healing Eschar 01/09/21 1500  Site / Wound Assessment Black 01/09/21 1500  % Wound base Red or Granulating 0% 01/09/21 1500  % Wound base Yellow/Fibrinous Exudate 0% 01/09/21 1500  % Wound base Black/Eschar 100% 01/09/21 1500  % Wound base Other/Granulation Tissue (Comment) 0% 01/09/21 1500  Peri-wound Assessment Intact;Pink 01/09/21 1500  Wound Length (cm) 1.8 cm 01/06/21 1400  Wound Width (cm) 2.5 cm 01/06/21 1400  Wound Depth (cm) 0 cm 01/06/21 1400  Wound Surface Area (cm^2) 4.5 cm^2 01/06/21 1400  Wound Volume (cm^3) 0 cm^3 01/06/21 1400  Tunneling (cm) 0 01/06/21 1527  Undermining (cm) 0 01/06/21 1527  Margins Unattached edges (unapproximated) 01/09/21 1500  Drainage Amount Minimal 01/09/21 1500  Drainage Description Purulent 01/09/21 1500  Treatment Debridement (Selective);Hydrotherapy (Pulse lavage);Packing (Saline gauze) 01/09/21 1500       Hydrotherapy Pulsed lavage therapy - wound location: R hip, L hip Pulsed Lavage with Suction (psi): 4 psi Pulsed Lavage with Suction - Normal Saline Used: 2000 mL Pulsed Lavage Tip: Tip with splash shield Selective Debridement Selective Debridement - Location: R hip, L hip Selective Debridement - Tools Used: Forceps, Scalpel Selective Debridement - Tissue Removed: Black eschar and yellow unviable tissue    Wound Assessment and Plan  Wound Therapy - Assess/Plan/Recommendations Wound Therapy - Clinical Statement: Thick slough where eschar was removed last session. Pt was very painful and debridement was limited. This patient will benefit from continued hydrotherapy for selective removal of unviable tissue, to decrease bioburden, and promote wound bed healing. Wound Therapy - Functional Problem List: Decreased tolerance for OOB mobility and position changes; acute pain. Factors Delaying/Impairing Wound Healing: Diabetes Mellitus, Immobility, Multiple medical problems Hydrotherapy Plan: Debridement, Dressing change, Patient/family education, Pulsatile lavage with suction Wound Therapy - Frequency: 6X / week Wound Therapy - Follow Up Recommendations: dressing changes by RN  Wound Therapy Goals- Improve the function of patient's integumentary system by progressing the wound(s) through the phases of wound healing (inflammation - proliferation - remodeling) by: Wound Therapy Goals - Improve the function of patient's integumentary system by progressing the wound(s) through the phases of wound healing by: Decrease Necrotic Tissue to: 20% Decrease Necrotic Tissue - Progress: Progressing toward goal Increase Granulation Tissue to: 80% Increase Granulation Tissue - Progress: Progressing toward goal Goals/treatment plan/discharge plan were made with and agreed upon by patient/family: Yes Time For Goal Achievement: 7 days Wound Therapy - Potential for Goals: Good  Goals will be updated until  maximal potential achieved or  discharge criteria met.  Discharge criteria: when goals achieved, discharge from hospital, MD decision/surgical intervention, no progress towards goals, refusal/missing three consecutive treatments without notification or medical reason.  GP     Charges PT Wound Care Charges $Wound Debridement up to 20 cm: < or equal to 20 cm $ Wound Debridement each add'l 20 sqcm: 9 $PT PLS Gun and Tip: 1 Supply $PT Hydrotherapy Visit: 2 Visits       Thelma Comp 01/09/2021, 3:23 PM  Rolinda Roan, PT, DPT Acute Rehabilitation Services Pager: [phone] Office: [phone]

## 2021-01-10 ENCOUNTER — Encounter: Payer: Self-pay | Admitting: Nurse Practitioner

## 2021-01-10 LAB — PARATHYROID HORMONE, INTACT (NO CA): PTH: 34 pg/mL (ref 15–65)

## 2021-01-18 ENCOUNTER — Other Ambulatory Visit: Payer: Self-pay

## 2021-01-18 ENCOUNTER — Ambulatory Visit (INDEPENDENT_AMBULATORY_CARE_PROVIDER_SITE_OTHER): Payer: Medicare Other | Admitting: Physician Assistant

## 2021-01-18 VITALS — BP 152/68 | HR 70 | Temp 97.8°F | Resp 20 | Ht 63.0 in

## 2021-01-18 DIAGNOSIS — I70262 Atherosclerosis of native arteries of extremities with gangrene, left leg: Secondary | ICD-10-CM

## 2021-01-18 NOTE — Progress Notes (Signed)
POST OPERATIVE OFFICE NOTE    CC:  F/u for surgery  HPI:  This is a 70 y.o. 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 She had significant LE edema at her last follow-up visit and was advised regarding elevation prior to staple removal. 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  She resides currently at a nursing home. Her daughter accompanies her today. She arrives in a wheelchair and is essentially non-ambulatory. 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  She states she is having some difficulty with completing dialysis treatment.  She is on apixaban for a. Fib. She is compliant with aspirin and statin.  Allergies  Allergen Reactions   Penicillins Anaphylaxis    Anaphylaxis as a child    Current Outpatient Medications  Medication Sig Dispense Refill   acetaminophen (TYLENOL) 650 MG CR tablet Take 650 mg by mouth every 8 (eight) hours as needed for pain.     amiodarone (PACERONE) 200 MG tablet Take 200 mg by mouth every evening.     apixaban (ELIQUIS) 5 MG TABS tablet Take 1 tablet (5 mg total) by mouth 2 (two) times daily. 60 tablet 6   Ascorbic Acid (VITAMIN C) 1000 MG tablet Take 1,000 mg by mouth daily.     aspirin EC 81 MG tablet Take 81 mg by mouth every evening. Swallow whole.     atorvastatin (LIPITOR) 20 MG tablet Take 20 mg by mouth every evening.     bisacodyl (DULCOLAX) 5 MG EC tablet Take 1 tablet (5 mg total) by mouth daily as needed for moderate constipation. 30 tablet 0   cephALEXin (KEFLEX) 500 MG capsule Take 1 capsule (500 mg total) by mouth daily for 14 days. 14 capsule 0   Darbepoetin Alfa (ARANESP) 200 MCG/0.4ML SOSY injection Inject 0.4 mLs (200 mcg total) into the vein every Monday with hemodialysis. 1.68 mL    Dextrose, Diabetic Use, (INSTA-GLUCOSE) 77.4 % GEL Take 1 Dose by mouth daily as needed (BG<70).     docusate sodium (COLACE) 100 MG  capsule Take 100 mg by mouth daily.     doxycycline (VIBRA-TABS) 100 MG tablet Take 1 tablet (100 mg total) by mouth every 12 (twelve) hours for 14 days. 28 tablet 0   erythromycin ophthalmic ointment Place 1 application into the right eye 3 (three) times daily.     famotidine (PEPCID) 20 MG tablet Take 1 tablet (20 mg total) by mouth 2 (two) times daily.     fluticasone (FLONASE) 50 MCG/ACT nasal spray Place 2 sprays into both nostrils as needed for allergies or rhinitis.     folic acid (FOLVITE) 657 MCG tablet Take 800 mcg by mouth daily.     glucagon (GLUCAGEN) 1 MG SOLR injection Inject 1 mg into the vein once as needed for low blood sugar. BG<70     glucose blood test strip 1 each by Other route as needed. Use as instructed     hydrocortisone (ANUSOL-HC) 25 MG suppository Place 1 suppository (25 mg total) rectally 2 (two) times daily. 12 suppository 0   insulin lispro (HUMALOG) 100 UNIT/ML KwikPen Inject 0-6 Units into the skin in the morning, at noon, in the evening, and at bedtime. If 70-150=0 units, 151-200=1 unit, 201-250=2 units, 251-300=3 units, 301-350=4 units, 351-400=6 units over 400 Call MD     lidocaine-prilocaine (EMLA) cream Apply 1 application topically  daily as needed (dialysis).     linagliptin (TRADJENTA) 5 MG TABS tablet Take 5 mg by mouth every evening.     loperamide (IMODIUM A-D) 2 MG tablet Take 2 mg by mouth 4 (four) times daily as needed for diarrhea or loose stools.     metoCLOPramide (REGLAN) 5 MG tablet Take 1 tablet (5 mg total) by mouth every 8 (eight) hours as needed for refractory nausea / vomiting. 90 tablet 1   montelukast (SINGULAIR) 10 MG tablet Take 10 mg by mouth daily as needed (allergies).     multivitamin (RENA-VIT) TABS tablet Take 1 tablet by mouth at bedtime.  0   Nutritional Supplements (,FEEDING SUPPLEMENT, PROSOURCE PLUS) liquid Take 30 mLs by mouth 3 (three) times daily between meals.     ondansetron (ZOFRAN) 4 MG tablet Take 1 tablet (4 mg total)  by mouth daily as needed for up to 365 doses for nausea or vomiting. 20 tablet 0   oxyCODONE (OXY IR/ROXICODONE) 5 MG immediate release tablet Take 1 tablet (5 mg total) by mouth every 6 (six) hours as needed for severe pain. 12 tablet 0   sevelamer carbonate (RENVELA) 800 MG tablet Take 1 tablet (800 mg total) by mouth 3 (three) times daily with meals.     traZODone (DESYREL) 50 MG tablet Take 1 tablet (50 mg total) by mouth at bedtime as needed for sleep.     No current facility-administered medications for this visit.     ROS:  See HPI  BP (!) 152/68 (BP Location: Right Arm, Patient Position: Sitting, Cuff Size: Normal)   Pulse 70   Temp 97.8 F (36.6 C)   Resp 20   Ht 5\' 3"  (1.6 m)   SpO2 100%   BMI 24.49 kg/m   Physical Exam:  General appearance: Awake, alert in no apparent distress Cardiac: Heart rate and rhythm are regular Respirations: Nonlabored Extremities: Moderate edema of both lower legs to knees. Ulceration noted of left medial distal lower leg. Right mid TMA incision with eschar and fibrinous exudate. Left toes ischemic now with involvement of plantar surface of first metatarsal.base of GT.  LEFT     RIGHT  Right medial ankle   Assessment/Plan:  This is a 70 y.o. female who is s/p: right TMA. Staples removed today and NS damp to dry dressing applied. Rec: BID dressing changes.  I advised her to try her best to elevate lower extremities above heart level and gave her written illustration of body position today. Recheck in 2 weeks.  Risa Grill, PA-C [facility] [phone]  Clinic MD:  Dr. Oneida Alar

## 2021-02-01 ENCOUNTER — Ambulatory Visit: Payer: Medicare Other

## 2021-02-09 ENCOUNTER — Ambulatory Visit (INDEPENDENT_AMBULATORY_CARE_PROVIDER_SITE_OTHER): Payer: Medicare Other | Admitting: Physician Assistant

## 2021-02-09 ENCOUNTER — Other Ambulatory Visit: Payer: Self-pay

## 2021-02-09 VITALS — BP 88/54 | HR 130 | Temp 97.2°F | Resp 20 | Ht 63.0 in | Wt 138.0 lb

## 2021-02-09 DIAGNOSIS — I739 Peripheral vascular disease, unspecified: Secondary | ICD-10-CM

## 2021-02-09 NOTE — Progress Notes (Signed)
History of Present Illness:  Patient is a 70 y.o. year old female who has had B LE bypass, and B feet with ischemic toes.  She had a right foot TMA that has failed to heal with drainage and coolness to touch.  The left foot still has gangrene on all 4 toes and drainage.  The medial thigh incision is healing well and they are doing wet to dry dressing changes daily.  She has significant edema in B LE and is unable to elevate her legs due to pressure ulcers on her bottom.    She has a left UE AV fistula that was infiltrated and now is on HD via right TDC.   She denise fevers and chills.   Past Medical History:  Diagnosis Date   AAA (abdominal aortic aneurysm) ([place]) 06/2019   Multiple small pseudoaneurysmal projections of the dominant aorta.  Distal abdominal aortic aneurysm 4.5 x 4.7 cm.  Greatest AP dimension of the infrarenal aorta is 4.9.  No evidence of thoracic aortic aneurysm or dissection.  Brief segment of proximal IMA occlusion.   Anemia    Coronary artery disease involving native coronary artery of native heart without angina pectoris 06/01/2020   Cardiac cath at [facility] 06/01/2020-preop for atrial myxoma resection -> severe proximal RCA and PDA -> had single-vessel CABG with SVG-PDA along with myxoma resection.   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 = Status post resection December 23,2021 (done at [facility] because of no bed availability at [facility]   Microscopic hematuria    PAF (paroxysmal atrial fibrillation) ([place]) 07/01/2020   Initially noted postoperatively-left atrial myxoma resection and CABG x1.  Now on amiodarone and apixaban..   Peripheral vascular disease ([place])    Bilateral SFA & ATA occlusion.  2 V runoffvia Peroneal A & PTA. --> s/p L Fem-Pop (AK pop A) bypass (PFTE) 09/12/2020 - pending R Fem-AKPop bypass   Plantar wart of right foot    PONV (postoperative nausea and vomiting)     Past Surgical History:  Procedure Laterality Date   ABDOMINAL AORTOGRAM W/LOWER EXTREMITY N/A 07/08/2020   Procedure: ABDOMINAL AORTOGRAM W/LOWER EXTREMITY;  Surgeon: Angelia Mould, MD;  Location: [place] CV LAB:  2 R Renal & 1 L Renal A. Known Para-Ren  Aneurysm. Bilat Com, Internal & External Iliacs - CFA& DFA patent.  Bilat SFA 100% @ origin - recon @ AK Pop A.; Bilateral 2 V runnoff - Bilateral Peroneal A & PTA patent w/ Bilat ATA CTO.   AV FISTULA PLACEMENT Left 07/11/2020   Procedure: LEFT FIRST STAGE BRACHIOBASILIC UPPER EXTREMITY ARTERIOVENOUS (AV) FISTULA CREATION;  Surgeon: [name], MD;  Location: [place];  Service: Vascular;  Laterality: Left;  BASCILIC VEIN TRANSPOSITION Left 10/03/2020   Procedure: BASILIC VEIN TRANSPOSITION SECOND STAGE LEFT;  Surgeon: Angelia Mould, MD;  Location: [place];  Service: Vascular;  Laterality: Left;   BIOPSY  10/01/2020   Procedure: BIOPSY;  Surgeon: Jackquline Denmark, MD;  Location: [facility] ENDOSCOPY;  Service: Endoscopy;;   Cardiac MRI  01/22/2020   Normal LV size and function.  Moderate focal basal septal hypertrophy.  No S.A.M.  LVEF 61%.  RVEF 66%.  Large mobile mass in the left atrium attached to the interatrial septum-does not appear to thrombus characteristics consistent  with left atrial myxoma.   Chest CTA  06/2019   Multiple small pseudoaneurysmal projections of the dominant aorta.  Distal abdominal aortic aneurysm 4.5 x 4.7 cm.  Greatest AP dimension of the infrarenal aorta is 4.9.  No evidence of thoracic aortic aneurysm or dissection.  Brief segment of proximal IMA occlusion.  Large geographic filling defect of the left atrium with associated dense radiopaque material.  Aortic atherosclerosis and emphysema   COLONOSCOPY W/ POLYPECTOMY     CORONARY ARTERY BYPASS GRAFT  06/02/2020   @ [facility] - Dr. Norm Parcel; SVG-rPDA along with Atrial Myxoma Resection.   ESOPHAGOGASTRODUODENOSCOPY (EGD) WITH PROPOFOL N/A 10/01/2020   Procedure: ESOPHAGOGASTRODUODENOSCOPY (EGD) WITH PROPOFOL;  Surgeon: Jackquline Denmark, MD;  Location: [facility] ENDOSCOPY;  Service: Endoscopy;  Laterality: N/A;   EYE SURGERY Bilateral    Cataract surgery   FEMORAL-POPLITEAL BYPASS GRAFT Left 09/12/2020   Procedure: LEFT FEMORAL-ABOVE KNEE POPLITEAL ARTERY BYPASS GRAFT;  Surgeon: Angelia Mould, MD;  Location: [place];  Service: Vascular;  Laterality: Left;   FEMORAL-POPLITEAL BYPASS GRAFT Right 10/31/2020   Procedure: RIGHT BYPASS GRAFT FEMORAL- ABOVE KNEE POPLITEAL ARTERY;  Surgeon: Angelia Mould, MD;  Location: [place];  Service: Vascular;  Laterality: Right;   HOT HEMOSTASIS N/A 10/01/2020   Procedure: HOT HEMOSTASIS (ARGON PLASMA COAGULATION/BICAP);  Surgeon: Jackquline Denmark, MD;  Location: [facility] ENDOSCOPY;  Service: Endoscopy;  Laterality: N/A;   I & D EXTREMITY Right 11/28/2020   Procedure: IRRIGATION AND DEBRIDEMENT RIGHT UPPER LEG WOUND;  Surgeon: Angelia Mould, MD;  Location: [facility] OR;  Service: Vascular;  Laterality: Right;   INTRAOPERATIVE TRANSESOPHAGEAL ECHOCARDIOGRAM  06/02/2020   [facility] (LA Myxoma Resection & CABG x 1): Pre-op EF> 55%. Normal WM. Mild AI. Large LA mass with calcifications &minimal vascularity (5.92 x 4 cm @ largest. LA dilation. Diffuse atherosclerosis of descending  Ao, focal calcification of Sinus of  Valsalva. -> Post-op. S/p Mass Excision - no residual mass in LA. No ASD or PFO.  EF >55% no WMA.   IR FLUORO GUIDE CV LINE RIGHT  07/06/2020   IR US GUIDE VASC ACCESS RIGHT  07/06/2020   LEFT ATRIAL MYXOMA RESECTION Left 06/02/2020   [facility]: Dr. Norm Parcel   LEFT HEART CATH AND CORONARY ANGIOGRAPHY  05/2020   [facility]: Biplane Coronary Angiography, Significant Prox RCA & rPDA disease. Normal CO/CI with elevated LVEDP.   TRANSESOPHAGEAL ECHOCARDIOGRAM  05/31/2020   [facility]:  Normal LV function.  No LAA thrombus.  Large mobile left atrial mass attached to the left atrial septum (4.8 x 3.1 cm with calcification)-most consistent with atrial myxoma.  EF estimated 55%.  Normal valves.   TRANSMETATARSAL AMPUTATION Right 11/28/2020   Procedure: RIGHT TRANSMETATARSAL AMPUTATION;  Surgeon: Angelia Mould, MD;  Location: [facility] OR;  Service: Vascular;  Laterality: Right;   TRANSTHORACIC ECHOCARDIOGRAM  07/2019    EF 60 to 65%.  GR 1 DD.  Elevated LVEDP.  Large size  ill-defined echodensity in the left atrium suspicious for thrombus versus tumor.  Moderately dilated LA.   TRANSTHORACIC ECHOCARDIOGRAM  08/31/2020   (Postop left atrial myxoma resection) normal LVEF of 55-60%.  No R WMA.  GR 1 DD.  Mildly reduced RV function with normal PAP.  Trivial AI.  Left atrial myxoma no longer present.  Mitral valve stable trivial MR.   TUBAL LIGATION       Social History Social History   Tobacco Use   Smoking status: Every Day    Packs/day: 0.50    Years: 50.00    Pack years: 25.00    Types: Cigarettes   Smokeless tobacco: Never  Vaping Use   Vaping Use: Never used  Substance Use Topics   Alcohol use: Not Currently   Drug use: Never    Family History Family History  Problem Relation Age of Onset   Diabetes Mother    Diabetes Father    Heart attack Father 74   CAD Father    Hyperlipidemia Father    Hypertension Father     Allergies  Allergies  Allergen Reactions    Penicillins Anaphylaxis    Anaphylaxis as a child     Current Outpatient Medications  Medication Sig Dispense Refill   acetaminophen (TYLENOL) 650 MG CR tablet Take 650 mg by mouth every 8 (eight) hours as needed for pain.     amiodarone (PACERONE) 200 MG tablet Take 200 mg by mouth every evening.     apixaban (ELIQUIS) 5 MG TABS tablet Take 1 tablet (5 mg total) by mouth 2 (two) times daily. 60 tablet 6   Ascorbic Acid (VITAMIN C) 1000 MG tablet Take 1,000 mg by mouth daily.     aspirin EC 81 MG tablet Take 81 mg by mouth every evening. Swallow whole.     atorvastatin (LIPITOR) 20 MG tablet Take 20 mg by mouth every evening.     bisacodyl (DULCOLAX) 5 MG EC tablet Take 1 tablet (5 mg total) by mouth daily as needed for moderate constipation. 30 tablet 0   Darbepoetin Alfa (ARANESP) 200 MCG/0.4ML SOSY injection Inject 0.4 mLs (200 mcg total) into the vein every Monday with hemodialysis. 1.68 mL    Dextrose, Diabetic Use, (INSTA-GLUCOSE) 77.4 % GEL Take 1 Dose by mouth daily as needed (BG<70).     docusate sodium (COLACE) 100 MG capsule Take 100 mg by mouth daily.     erythromycin ophthalmic ointment Place 1 application into the right eye 3 (three) times daily.     famotidine (PEPCID) 20 MG tablet Take 1 tablet (20 mg total) by mouth 2 (two) times daily.     fluticasone (FLONASE) 50 MCG/ACT nasal spray Place 2 sprays into both nostrils as needed for allergies or rhinitis.     folic acid (FOLVITE) 981 MCG tablet Take 800 mcg by mouth daily.     glucagon (GLUCAGEN) 1 MG SOLR injection Inject 1 mg into the vein once as needed for low blood sugar. BG<70     glucose blood test strip 1 each by Other route as needed. Use as instructed     hydrocortisone (ANUSOL-HC) 25 MG suppository Place 1 suppository (25 mg total) rectally 2 (two) times daily. 12 suppository 0   insulin lispro (HUMALOG) 100 UNIT/ML KwikPen Inject 0-6 Units into the skin in the morning, at noon, in the evening, and at bedtime. If  70-150=0 units, 151-200=1 unit, 201-250=2 units, 251-300=3 units, 301-350=4 units, 351-400=6 units over 400 Call MD  lidocaine-prilocaine (EMLA) cream Apply 1 application topically daily as needed (dialysis).     linagliptin (TRADJENTA) 5 MG TABS tablet Take 5 mg by mouth every evening.     loperamide (IMODIUM A-D) 2 MG tablet Take 2 mg by mouth 4 (four) times daily as needed for diarrhea or loose stools.     metoCLOPramide (REGLAN) 5 MG tablet Take 1 tablet (5 mg total) by mouth every 8 (eight) hours as needed for refractory nausea / vomiting. 90 tablet 1   montelukast (SINGULAIR) 10 MG tablet Take 10 mg by mouth daily as needed (allergies).     multivitamin (RENA-VIT) TABS tablet Take 1 tablet by mouth at bedtime.  0   Nutritional Supplements (,FEEDING SUPPLEMENT, PROSOURCE PLUS) liquid Take 30 mLs by mouth 3 (three) times daily between meals.     ondansetron (ZOFRAN) 4 MG tablet Take 1 tablet (4 mg total) by mouth daily as needed for up to 365 doses for nausea or vomiting. 20 tablet 0   oxyCODONE (OXY IR/ROXICODONE) 5 MG immediate release tablet Take 1 tablet (5 mg total) by mouth every 6 (six) hours as needed for severe pain. 12 tablet 0   sevelamer carbonate (RENVELA) 800 MG tablet Take 1 tablet (800 mg total) by mouth 3 (three) times daily with meals.     traZODone (DESYREL) 50 MG tablet Take 1 tablet (50 mg total) by mouth at bedtime as needed for sleep.     No current facility-administered medications for this visit.    ROS:   General:  No weight loss, Fever, chills  HEENT: No recent headaches, no nasal bleeding, no visual changes, no sore throat  Neurologic: No dizziness, blackouts, seizures. No recent symptoms of stroke or mini- stroke. No recent episodes of slurred speech, or temporary blindness.  Cardiac: No recent episodes of chest pain/pressure, no shortness of breath at rest.  No shortness of breath with exertion.  Denies history of atrial fibrillation or irregular  heartbeat  Vascular: No history of rest pain in feet.  No history of claudication.  + history of non-healing ulcer, No history of DVT   Pulmonary: No home oxygen, no productive cough, no hemoptysis,  No asthma or wheezing  Musculoskeletal:  [ ]  Arthritis, [ ]  Low back pain,  [ ]  Joint pain  Hematologic:No history of hypercoagulable state.  No history of easy bleeding.  No history of anemia  Gastrointestinal: No hematochezia or melena,  No gastroesophageal reflux, no trouble swallowing  Urinary: [ ]  chronic Kidney disease, [ x] on HD - [x ] MWF or [ ]  TTHS, [ ]  Burning with urination, [ ]  Frequent urination, [ ]  Difficulty urinating;   Skin: No rashes  Psychological: No history of anxiety,  No history of depression   Physical Examination  Vitals:   02/09/21 1021  BP: (!) 88/54  Pulse: (!) 130  Resp: 20  Temp: (!) 97.2 F (36.2 C)  SpO2: 98%  Weight: 138 lb (62.6 kg)  Height: 5\' 3"  (1.6 m)    Body mass index is 24.45 kg/m.  General:  Alert and oriented, no acute distress HEENT: Normal Neck: No bruit or JVD Pulmonary: Clear to auscultation bilaterally Cardiac: Regular Rate and Rhythm without murmur Gastrointestinal: Soft, non-tender, non-distended, no mass, no scars     Extremity Pulses:  2+ radial, pulses bilaterally, palpable thrill in the left AV fistula with hematoma that appears stable. Musculoskeletal: No deformity or edema  Neurologic: Upper and lower extremity motor 5/5 and symmetric  ASSESSMENT/Plan: Sever  PAD complicated by ESRD and DM The TMA is not healing and her right LE is cool to touch.  The left LE has Peroneal and PT doppler signals to the ankle, draining toes with gangrene ischemic changes.   Dr. Scot Dock examined the feet and we have now planned for B AKA.  She does not have revascularization options at this time.  The patient and her daughter agree with this plan.   The fistula should be rested and use TDC.       Roxy Horseman PA-C [facility] [place] Office: [phone]  MD in office [place]

## 2021-02-13 ENCOUNTER — Emergency Department (HOSPITAL_COMMUNITY): Payer: Medicare Other

## 2021-02-13 ENCOUNTER — Other Ambulatory Visit: Payer: Self-pay

## 2021-02-13 ENCOUNTER — Inpatient Hospital Stay (HOSPITAL_COMMUNITY)
Admission: EM | Admit: 2021-02-13 | Discharge: 2021-03-11 | DRG: 871 | Disposition: E | Payer: Medicare Other | Attending: Pulmonary Disease | Admitting: Pulmonary Disease

## 2021-02-13 DIAGNOSIS — I70231 Atherosclerosis of native arteries of right leg with ulceration of thigh: Secondary | ICD-10-CM | POA: Diagnosis present

## 2021-02-13 DIAGNOSIS — Z515 Encounter for palliative care: Secondary | ICD-10-CM | POA: Diagnosis not present

## 2021-02-13 DIAGNOSIS — E1152 Type 2 diabetes mellitus with diabetic peripheral angiopathy with gangrene: Secondary | ICD-10-CM | POA: Diagnosis present

## 2021-02-13 DIAGNOSIS — R6520 Severe sepsis without septic shock: Secondary | ICD-10-CM | POA: Diagnosis not present

## 2021-02-13 DIAGNOSIS — E1122 Type 2 diabetes mellitus with diabetic chronic kidney disease: Secondary | ICD-10-CM | POA: Diagnosis present

## 2021-02-13 DIAGNOSIS — K55069 Acute infarction of intestine, part and extent unspecified: Secondary | ICD-10-CM | POA: Diagnosis present

## 2021-02-13 DIAGNOSIS — Z79899 Other long term (current) drug therapy: Secondary | ICD-10-CM

## 2021-02-13 DIAGNOSIS — D689 Coagulation defect, unspecified: Secondary | ICD-10-CM | POA: Diagnosis present

## 2021-02-13 DIAGNOSIS — L89159 Pressure ulcer of sacral region, unspecified stage: Secondary | ICD-10-CM | POA: Diagnosis not present

## 2021-02-13 DIAGNOSIS — Z20822 Contact with and (suspected) exposure to covid-19: Secondary | ICD-10-CM | POA: Diagnosis present

## 2021-02-13 DIAGNOSIS — Z992 Dependence on renal dialysis: Secondary | ICD-10-CM

## 2021-02-13 DIAGNOSIS — D6959 Other secondary thrombocytopenia: Secondary | ICD-10-CM | POA: Diagnosis present

## 2021-02-13 DIAGNOSIS — F32A Depression, unspecified: Secondary | ICD-10-CM | POA: Diagnosis present

## 2021-02-13 DIAGNOSIS — E43 Unspecified severe protein-calorie malnutrition: Secondary | ICD-10-CM | POA: Diagnosis present

## 2021-02-13 DIAGNOSIS — E11649 Type 2 diabetes mellitus with hypoglycemia without coma: Secondary | ICD-10-CM | POA: Diagnosis present

## 2021-02-13 DIAGNOSIS — E876 Hypokalemia: Secondary | ICD-10-CM | POA: Diagnosis not present

## 2021-02-13 DIAGNOSIS — R6521 Severe sepsis with septic shock: Secondary | ICD-10-CM | POA: Diagnosis present

## 2021-02-13 DIAGNOSIS — K219 Gastro-esophageal reflux disease without esophagitis: Secondary | ICD-10-CM | POA: Diagnosis present

## 2021-02-13 DIAGNOSIS — I70262 Atherosclerosis of native arteries of extremities with gangrene, left leg: Secondary | ICD-10-CM | POA: Diagnosis present

## 2021-02-13 DIAGNOSIS — E1169 Type 2 diabetes mellitus with other specified complication: Secondary | ICD-10-CM | POA: Diagnosis present

## 2021-02-13 DIAGNOSIS — J449 Chronic obstructive pulmonary disease, unspecified: Secondary | ICD-10-CM | POA: Diagnosis present

## 2021-02-13 DIAGNOSIS — Z86718 Personal history of other venous thrombosis and embolism: Secondary | ICD-10-CM | POA: Diagnosis not present

## 2021-02-13 DIAGNOSIS — D631 Anemia in chronic kidney disease: Secondary | ICD-10-CM | POA: Diagnosis present

## 2021-02-13 DIAGNOSIS — Z951 Presence of aortocoronary bypass graft: Secondary | ICD-10-CM

## 2021-02-13 DIAGNOSIS — Z95820 Peripheral vascular angioplasty status with implants and grafts: Secondary | ICD-10-CM | POA: Diagnosis not present

## 2021-02-13 DIAGNOSIS — Z833 Family history of diabetes mellitus: Secondary | ICD-10-CM

## 2021-02-13 DIAGNOSIS — I714 Abdominal aortic aneurysm, without rupture: Secondary | ICD-10-CM | POA: Diagnosis present

## 2021-02-13 DIAGNOSIS — I771 Stricture of artery: Secondary | ICD-10-CM | POA: Diagnosis present

## 2021-02-13 DIAGNOSIS — J9601 Acute respiratory failure with hypoxia: Secondary | ICD-10-CM | POA: Diagnosis not present

## 2021-02-13 DIAGNOSIS — F17210 Nicotine dependence, cigarettes, uncomplicated: Secondary | ICD-10-CM | POA: Diagnosis present

## 2021-02-13 DIAGNOSIS — Z8349 Family history of other endocrine, nutritional and metabolic diseases: Secondary | ICD-10-CM

## 2021-02-13 DIAGNOSIS — Z781 Physical restraint status: Secondary | ICD-10-CM

## 2021-02-13 DIAGNOSIS — Z794 Long term (current) use of insulin: Secondary | ICD-10-CM

## 2021-02-13 DIAGNOSIS — Z7189 Other specified counseling: Secondary | ICD-10-CM | POA: Diagnosis not present

## 2021-02-13 DIAGNOSIS — I739 Peripheral vascular disease, unspecified: Secondary | ICD-10-CM

## 2021-02-13 DIAGNOSIS — Z89431 Acquired absence of right foot: Secondary | ICD-10-CM

## 2021-02-13 DIAGNOSIS — N186 End stage renal disease: Secondary | ICD-10-CM | POA: Diagnosis present

## 2021-02-13 DIAGNOSIS — A419 Sepsis, unspecified organism: Principal | ICD-10-CM | POA: Diagnosis present

## 2021-02-13 DIAGNOSIS — Z6824 Body mass index (BMI) 24.0-24.9, adult: Secondary | ICD-10-CM

## 2021-02-13 DIAGNOSIS — I2510 Atherosclerotic heart disease of native coronary artery without angina pectoris: Secondary | ICD-10-CM | POA: Diagnosis not present

## 2021-02-13 DIAGNOSIS — Z7902 Long term (current) use of antithrombotics/antiplatelets: Secondary | ICD-10-CM | POA: Diagnosis not present

## 2021-02-13 DIAGNOSIS — Z8249 Family history of ischemic heart disease and other diseases of the circulatory system: Secondary | ICD-10-CM

## 2021-02-13 DIAGNOSIS — I480 Paroxysmal atrial fibrillation: Secondary | ICD-10-CM | POA: Diagnosis present

## 2021-02-13 DIAGNOSIS — I120 Hypertensive chronic kidney disease with stage 5 chronic kidney disease or end stage renal disease: Secondary | ICD-10-CM | POA: Diagnosis present

## 2021-02-13 DIAGNOSIS — I96 Gangrene, not elsewhere classified: Secondary | ICD-10-CM | POA: Diagnosis not present

## 2021-02-13 DIAGNOSIS — L97112 Non-pressure chronic ulcer of right thigh with fat layer exposed: Secondary | ICD-10-CM | POA: Diagnosis present

## 2021-02-13 DIAGNOSIS — Z95828 Presence of other vascular implants and grafts: Secondary | ICD-10-CM | POA: Diagnosis not present

## 2021-02-13 DIAGNOSIS — Z8679 Personal history of other diseases of the circulatory system: Secondary | ICD-10-CM

## 2021-02-13 DIAGNOSIS — Z66 Do not resuscitate: Secondary | ICD-10-CM | POA: Diagnosis not present

## 2021-02-13 DIAGNOSIS — Z7984 Long term (current) use of oral hypoglycemic drugs: Secondary | ICD-10-CM

## 2021-02-13 DIAGNOSIS — E785 Hyperlipidemia, unspecified: Secondary | ICD-10-CM | POA: Diagnosis present

## 2021-02-13 DIAGNOSIS — I719 Aortic aneurysm of unspecified site, without rupture: Secondary | ICD-10-CM

## 2021-02-13 DIAGNOSIS — R54 Age-related physical debility: Secondary | ICD-10-CM | POA: Diagnosis present

## 2021-02-13 DIAGNOSIS — R579 Shock, unspecified: Secondary | ICD-10-CM | POA: Diagnosis not present

## 2021-02-13 DIAGNOSIS — Z7982 Long term (current) use of aspirin: Secondary | ICD-10-CM

## 2021-02-13 DIAGNOSIS — Z7901 Long term (current) use of anticoagulants: Secondary | ICD-10-CM

## 2021-02-13 LAB — CBG MONITORING, ED
Glucose-Capillary: 59 mg/dL — ABNORMAL LOW (ref 70–99)
Glucose-Capillary: 74 mg/dL (ref 70–99)
Glucose-Capillary: <10 mg/dL — CL (ref 70–99)
Glucose-Capillary: 214 mg/dL — ABNORMAL HIGH (ref 70–99)
Glucose-Capillary: 228 mg/dL — ABNORMAL HIGH (ref 70–99)
Glucose-Capillary: 41 mg/dL — CL (ref 70–99)

## 2021-02-13 LAB — COMPREHENSIVE METABOLIC PANEL
ALT: 128 U/L — ABNORMAL HIGH (ref 0–44)
AST: 366 U/L — ABNORMAL HIGH (ref 15–41)
Albumin: 1.5 g/dL — ABNORMAL LOW (ref 3.5–5.0)
Alkaline Phosphatase: 113 U/L (ref 38–126)
Anion gap: 36 — ABNORMAL HIGH (ref 5–15)
BUN: 26 mg/dL — ABNORMAL HIGH (ref 8–23)
CO2: 9 mmol/L — ABNORMAL LOW (ref 22–32)
Calcium: 8.7 mg/dL — ABNORMAL LOW (ref 8.9–10.3)
Chloride: 90 mmol/L — ABNORMAL LOW (ref 98–111)
Creatinine, Ser: 5.68 mg/dL — ABNORMAL HIGH (ref 0.44–1.00)
GFR, Estimated: 8 mL/min — ABNORMAL LOW (ref >60)
Glucose, Bld: 76 mg/dL (ref 70–99)
Potassium: 3.9 mmol/L (ref 3.5–5.1)
Sodium: 135 mmol/L (ref 135–145)
Total Bilirubin: 0.8 mg/dL (ref 0.3–1.2)
Total Protein: 4.9 g/dL — ABNORMAL LOW (ref 6.5–8.1)

## 2021-02-13 LAB — CBC WITH DIFFERENTIAL/PLATELET
Abs Immature Granulocytes: 0 10*3/uL (ref 0.00–0.07)
Basophils Absolute: 0 10*3/uL (ref 0.0–0.1)
Basophils Relative: 0 %
Eosinophils Absolute: 0 10*3/uL (ref 0.0–0.5)
Eosinophils Relative: 0 %
HCT: 36.5 % (ref 36.0–46.0)
Hemoglobin: 10.8 g/dL — ABNORMAL LOW (ref 12.0–15.0)
Lymphocytes Relative: 2 %
Lymphs Abs: 0.4 10*3/uL — ABNORMAL LOW (ref 0.7–4.0)
MCH: 30.2 pg (ref 26.0–34.0)
MCHC: 29.6 g/dL — ABNORMAL LOW (ref 30.0–36.0)
MCV: 102 fL — ABNORMAL HIGH (ref 80.0–100.0)
Monocytes Absolute: 0.2 10*3/uL (ref 0.1–1.0)
Monocytes Relative: 1 %
Neutro Abs: 19.6 10*3/uL — ABNORMAL HIGH (ref 1.7–7.7)
Neutrophils Relative %: 97 %
Platelets: 144 10*3/uL — ABNORMAL LOW (ref 150–400)
RBC: 3.58 MIL/uL — ABNORMAL LOW (ref 3.87–5.11)
RDW: 18.6 % — ABNORMAL HIGH (ref 11.5–15.5)
WBC: 20.2 10*3/uL — ABNORMAL HIGH (ref 4.0–10.5)
nRBC: 0 /100 WBC
nRBC: 0.2 % (ref 0.0–0.2)

## 2021-02-13 LAB — GLUCOSE, CAPILLARY
Glucose-Capillary: 192 mg/dL — ABNORMAL HIGH (ref 70–99)
Glucose-Capillary: 221 mg/dL — ABNORMAL HIGH (ref 70–99)

## 2021-02-13 LAB — PROTIME-INR
INR: 5.9 (ref 0.8–1.2)
Prothrombin Time: 52.9 seconds — ABNORMAL HIGH (ref 11.4–15.2)

## 2021-02-13 LAB — MRSA NEXT GEN BY PCR, NASAL: MRSA by PCR Next Gen: NOT DETECTED

## 2021-02-13 LAB — I-STAT CHEM 8, ED
BUN: 25 mg/dL — ABNORMAL HIGH (ref 8–23)
Calcium, Ion: 1.01 mmol/L — ABNORMAL LOW (ref 1.15–1.40)
Chloride: 97 mmol/L — ABNORMAL LOW (ref 98–111)
Creatinine, Ser: 5.5 mg/dL — ABNORMAL HIGH (ref 0.44–1.00)
Glucose, Bld: 70 mg/dL (ref 70–99)
HCT: 36 % (ref 36.0–46.0)
Hemoglobin: 12.2 g/dL (ref 12.0–15.0)
Potassium: 3.8 mmol/L (ref 3.5–5.1)
Sodium: 132 mmol/L — ABNORMAL LOW (ref 135–145)
TCO2: 12 mmol/L — ABNORMAL LOW (ref 22–32)

## 2021-02-13 LAB — APTT: aPTT: 41 seconds — ABNORMAL HIGH (ref 24–36)

## 2021-02-13 LAB — RESP PANEL BY RT-PCR (FLU A&B, COVID) ARPGX2
Influenza A by PCR: NEGATIVE
Influenza B by PCR: NEGATIVE
SARS Coronavirus 2 by RT PCR: NEGATIVE

## 2021-02-13 LAB — LACTIC ACID, PLASMA
Lactic Acid, Venous: >11 mmol/L (ref 0.5–1.9)
Lactic Acid, Venous: >11 mmol/L (ref 0.5–1.9)
Lactic Acid, Venous: >11 mmol/L (ref 0.5–1.9)

## 2021-02-13 MED ORDER — DEXTROSE 50 % IV SOLN
INTRAVENOUS | Status: AC
  Administered 2021-02-13: 50 mL
  Filled 2021-02-13: qty 50

## 2021-02-13 MED ORDER — AMIODARONE HCL 200 MG PO TABS: 200.0000 mg | ORAL_TABLET | Freq: Every day | ORAL | Status: DC

## 2021-02-13 MED ORDER — PANTOPRAZOLE SODIUM 40 MG IV SOLR
40.0000 mg | Freq: Every day | INTRAVENOUS | Status: DC
  Administered 2021-02-13: 40 mg via INTRAVENOUS
  Filled 2021-02-13: qty 40

## 2021-02-13 MED ORDER — DEXTROSE 10 % IV SOLN
INTRAVENOUS | Status: DC
  Administered 2021-02-14: 75 mL/h via INTRAVENOUS

## 2021-02-13 MED ORDER — VANCOMYCIN HCL 1500 MG/300ML IV SOLN
1500.0000 mg | Freq: Once | INTRAVENOUS | Status: AC
  Administered 2021-02-13: 1500 mg via INTRAVENOUS
  Filled 2021-02-13: qty 300

## 2021-02-13 MED ORDER — IOHEXOL 350 MG/ML SOLN
100.0000 mL | Freq: Once | INTRAVENOUS | Status: AC | PRN
  Administered 2021-02-13: 100 mL via INTRAVENOUS

## 2021-02-13 MED ORDER — DARBEPOETIN ALFA 200 MCG/0.4ML IJ SOSY
200.0000 ug | PREFILLED_SYRINGE | INTRAMUSCULAR | Status: DC
  Filled 2021-02-13: qty 0.4

## 2021-02-13 MED ORDER — LACTATED RINGERS IV SOLN: INTRAVENOUS | Status: AC

## 2021-02-13 MED ORDER — BISACODYL 5 MG PO TBEC: 5.0000 mg | DELAYED_RELEASE_TABLET | Freq: Every day | ORAL | Status: DC | PRN

## 2021-02-13 MED ORDER — OXYCODONE HCL 5 MG PO TABS: 5.0000 mg | ORAL_TABLET | Freq: Four times a day (QID) | ORAL | Status: DC | PRN

## 2021-02-13 MED ORDER — DEXTROSE 50 % IV SOLN: 25.0000 g | INTRAVENOUS | Status: AC

## 2021-02-13 MED ORDER — APIXABAN 5 MG PO TABS
5.0000 mg | ORAL_TABLET | Freq: Two times a day (BID) | ORAL | Status: DC
  Administered 2021-02-13: 5 mg via ORAL
  Filled 2021-02-13: qty 1

## 2021-02-13 MED ORDER — SODIUM CHLORIDE 0.9 % IV SOLN: 250.0000 mL | INTRAVENOUS | Status: DC

## 2021-02-13 MED ORDER — IPRATROPIUM-ALBUTEROL 0.5-2.5 (3) MG/3ML IN SOLN: 3.0000 mL | Freq: Four times a day (QID) | RESPIRATORY_TRACT | Status: DC | PRN

## 2021-02-13 MED ORDER — LACTATED RINGERS IV BOLUS
1000.0000 mL | Freq: Once | INTRAVENOUS | Status: AC
  Administered 2021-02-13: 1000 mL via INTRAVENOUS

## 2021-02-13 MED ORDER — SODIUM CHLORIDE 0.9 % IV SOLN
1.0000 g | INTRAVENOUS | Status: DC
  Filled 2021-02-13: qty 1

## 2021-02-13 MED ORDER — DOCUSATE SODIUM 100 MG PO CAPS: 100.0000 mg | ORAL_CAPSULE | Freq: Two times a day (BID) | ORAL | Status: DC | PRN

## 2021-02-13 MED ORDER — POLYETHYLENE GLYCOL 3350 17 G PO PACK: 17.0000 g | PACK | Freq: Every day | ORAL | Status: DC | PRN

## 2021-02-13 MED ORDER — SODIUM CHLORIDE 0.9 % IV SOLN
2.0000 g | Freq: Once | INTRAVENOUS | Status: AC
  Administered 2021-02-13: 2 g via INTRAVENOUS
  Filled 2021-02-13: qty 2

## 2021-02-13 MED ORDER — METRONIDAZOLE 500 MG/100ML IV SOLN
500.0000 mg | Freq: Once | INTRAVENOUS | Status: AC
  Administered 2021-02-13: 500 mg via INTRAVENOUS
  Filled 2021-02-13: qty 100

## 2021-02-13 MED ORDER — SODIUM CHLORIDE 0.9 % IV SOLN: 2.0000 g | Freq: Once | INTRAVENOUS | Status: DC

## 2021-02-13 MED ORDER — LACTATED RINGERS IV BOLUS (SEPSIS)
1000.0000 mL | Freq: Once | INTRAVENOUS | Status: AC
  Administered 2021-02-13: 1000 mL via INTRAVENOUS

## 2021-02-13 MED ORDER — ASPIRIN EC 81 MG PO TBEC: 81.0000 mg | DELAYED_RELEASE_TABLET | Freq: Every evening | ORAL | Status: DC

## 2021-02-13 MED ORDER — PHENYLEPHRINE HCL-NACL 20-0.9 MG/250ML-% IV SOLN
0.0000 ug/min | INTRAVENOUS | Status: DC
  Administered 2021-02-13: 180 ug/min via INTRAVENOUS
  Administered 2021-02-13: 220 ug/min via INTRAVENOUS
  Administered 2021-02-13: 100 ug/min via INTRAVENOUS
  Administered 2021-02-14 (×3): 220 ug/min via INTRAVENOUS
  Administered 2021-02-14: 165 ug/min via INTRAVENOUS
  Administered 2021-02-14: 220 ug/min via INTRAVENOUS
  Filled 2021-02-13 (×10): qty 250
  Filled 2021-02-13: qty 500

## 2021-02-13 MED ORDER — DEXTROSE 50 % IV SOLN
INTRAVENOUS | Status: AC
  Administered 2021-02-13: 25 g via INTRAVENOUS
  Filled 2021-02-13: qty 50

## 2021-02-13 MED ORDER — VANCOMYCIN VARIABLE DOSE PER UNSTABLE RENAL FUNCTION (PHARMACIST DOSING): Status: DC

## 2021-02-13 MED ORDER — NOREPINEPHRINE 4 MG/250ML-% IV SOLN
0.0000 ug/min | INTRAVENOUS | Status: DC
Start: 2021-02-13 — End: 2021-02-14
  Administered 2021-02-13: 2 ug/min via INTRAVENOUS
  Filled 2021-02-13: qty 250

## 2021-02-13 NOTE — ED Notes (Signed)
Dr Almyra Free at bedside starting central line.

## 2021-02-13 NOTE — ED Notes (Signed)
Informed Dr Almyra Free of pt's glucose after 2 amps of Dextrose= 228. Per MD, start D10% as blood sugar may drop again.

## 2021-02-13 NOTE — Progress Notes (Signed)
Elink following for sepsis protocol. 

## 2021-02-13 NOTE — Consult Note (Signed)
Patient name: Kathryn Hancock MRN: [id number] DOB: Oct 06, 1950 Sex: female  VASCULAR SURGERY ASSESSMENT & PLAN:   SEPSIS: This patient has multiple potential sources for her sepsis.  She has nonhealing wounds of both feet and was scheduled for bilateral above-the-knee amputations next week.  In addition she has a sacral decubitus which we have been unable to look at as she is in too much pain to roll her.  She also has a dialysis catheter.  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  I have had a long discussion with the daughter.  During a previous hospitalization the patient had been seen by palliative care and was given a very poor prognosis.  According to her daughter the patient recently has expressed his desire to try everything possible.  However I have explained to the daughter that given her multiple issues I think that palliative care may be the best approach.  Certainly when she is stable we need to get a good look at her sacral decubitus which could be a source of sepsis.  If she stabilizes we could consider bilateral above-the-knee amputations.  If her abdominal pain worsens she could be considered for bowel exploration.  However, given her markedly debilitated state, end-stage renal disease, nonhealing wounds of both feet, sacral decubitus, inoperable pararenal and abdominal aortic aneurysms, DM, CAD, and malnutrition I think that Palliative Care would be very appropriate.  I think the daughter has expressed some desire for her to go home with palliative care although I am not sure she will make it out of the hospital given the severity of her current illness.  REASON FOR CONSULT:   Nonhealing wounds from both feet and sepsis.  The consult is from Dr. Almyra Free.  HPI:   Kathryn Hancock is a pleasant 70 y.o. female who is well-known  to me.  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  Repair of these aneurysms would require either a complicated fenestrated graft or a thoracoabdominal repair neither of which she would tolerate.  I subsequently saw her in January of this year with embolic disease to both feet.  She has a history of a atrial myxoma which was resected in the December 2021 at [facility].  She developed embolic disease to her kidneys and toes.  On 09/12/2020 she underwent a left femoral to above-knee popliteal artery bypass with PTFE.   On 10/31/2020 she underwent a right femoral to above-knee popliteal artery bypass with a vein graft.  She had a right transmetatarsal amputation on 11/28/20.  Despite patent bypass grafts her wounds were not healing and she was seen in the office last week.  Given her markedly debilitated state I felt the only thing to offer would be bilateral above-the-knee amputations.  She has a tunneled dialysis catheter for dialysis.  She presented to the emergency department lethargic and hypotensive and is felt to be septic.  She is currently on Levophed.  On my history, according to the daughter the patient has been complaining of some abdominal pain.   Current Facility-Administered Medications  Medication Dose Route Frequency Provider Last Rate Last Admin   [START ON 02/14/2021] ceFEPIme (MAXIPIME) 1 g in sodium chloride 0.9 % 100 mL IVPB  1 g Intravenous Q24H von Caren Griffins, RPH  dextrose 10 % infusion   Intravenous Continuous [name], MD 75 mL/hr at 02/14/2021 1309 New Bag at 03/10/2021 1309   lactated ringers infusion   Intravenous Continuous [name], MD 150 mL/hr at 02/10/2021 1108 New Bag at 02/12/2021 1108   lactated ringers infusion   Intravenous Continuous Hong, Greggory Brandy, MD       norepinephrine (LEVOPHED) 4mg  in 2101mL premix infusion  0-40 mcg/min Intravenous Continuous [name], MD 15 mL/hr at 02/14/2021 1330 4 mcg/min at 02/12/2021 1330   [START ON 02/14/2021] vancomycin variable dose per unstable renal function (pharmacist dosing)   Does not apply See admin instructions von Caren Griffins, RPH       Current Outpatient Medications  Medication Sig Dispense Refill   acetaminophen (TYLENOL) 650 MG CR tablet Take 650 mg by mouth every 8 (eight) hours as needed for pain.     amiodarone (PACERONE) 200 MG tablet Take 200 mg by mouth every evening.     apixaban (ELIQUIS) 5 MG TABS tablet Take 1 tablet (5 mg total) by mouth 2 (two) times daily. 60 tablet 6   Ascorbic Acid (VITAMIN C) 1000 MG tablet Take 1,000 mg by mouth daily.     aspirin EC 81 MG tablet Take 81 mg by mouth every evening. Swallow whole.     atorvastatin (LIPITOR) 20 MG tablet Take 20 mg by mouth every evening.     bisacodyl (DULCOLAX) 5 MG EC tablet Take 1 tablet (5 mg total) by mouth daily as needed for moderate constipation. 30 tablet 0   Darbepoetin Alfa (ARANESP) 200 MCG/0.4ML SOSY injection Inject 0.4 mLs (200 mcg total) into the vein every Monday with hemodialysis. 1.68 mL    Dextrose, Diabetic Use, (INSTA-GLUCOSE) 77.4 % GEL Take 1 Dose by mouth daily as needed (BG<70).     docusate sodium (COLACE) 100 MG capsule Take 100 mg by mouth daily.     erythromycin ophthalmic ointment Place 1 application into the right eye 3 (three) times daily.     famotidine (PEPCID) 20 MG tablet Take 1 tablet (20 mg total) by mouth 2 (two) times daily.     fluticasone (FLONASE) 50 MCG/ACT nasal spray Place 2 sprays into both nostrils as needed for allergies or rhinitis.     folic acid (FOLVITE) 381 MCG tablet Take 800 mcg by mouth daily.     glucagon (GLUCAGEN) 1 MG SOLR injection Inject 1 mg into the vein once as needed for low blood sugar. BG<70     glucose blood test strip 1 each by Other route as needed. Use as instructed     hydrocortisone (ANUSOL-HC) 25 MG suppository Place 1 suppository (25 mg total) rectally 2  (two) times daily. 12 suppository 0   insulin lispro (HUMALOG) 100 UNIT/ML KwikPen Inject 0-6 Units into the skin in the morning, at noon, in the evening, and at bedtime. If 70-150=0 units, 151-200=1 unit, 201-250=2 units, 251-300=3 units, 301-350=4 units, 351-400=6 units over 400 Call MD     lidocaine-prilocaine (EMLA) cream Apply 1 application topically daily as needed (dialysis).     linagliptin (TRADJENTA) 5 MG TABS tablet Take 5 mg by mouth every evening.     loperamide (IMODIUM A-D) 2 MG tablet Take 2 mg by mouth 4 (four) times daily as needed for diarrhea or loose stools.     metoCLOPramide (REGLAN) 5 MG tablet Take 1 tablet (5 mg total) by mouth every 8 (eight) hours as needed for refractory nausea / vomiting.  90 tablet 1   montelukast (SINGULAIR) 10 MG tablet Take 10 mg by mouth daily as needed (allergies).     multivitamin (RENA-VIT) TABS tablet Take 1 tablet by mouth at bedtime.  0   Nutritional Supplements (,FEEDING SUPPLEMENT, PROSOURCE PLUS) liquid Take 30 mLs by mouth 3 (three) times daily between meals.     ondansetron (ZOFRAN) 4 MG tablet Take 1 tablet (4 mg total) by mouth daily as needed for up to 365 doses for nausea or vomiting. 20 tablet 0   oxyCODONE (OXY IR/ROXICODONE) 5 MG immediate release tablet Take 1 tablet (5 mg total) by mouth every 6 (six) hours as needed for severe pain. 12 tablet 0   sevelamer carbonate (RENVELA) 800 MG tablet Take 1 tablet (800 mg total) by mouth 3 (three) times daily with meals.     traZODone (DESYREL) 50 MG tablet Take 1 tablet (50 mg total) by mouth at bedtime as needed for sleep.      REVIEW OF SYSTEMS:  [X]  denotes positive finding, [ ]  denotes negative finding Vascular    Leg swelling    Cardiac    Chest pain or chest pressure:    Shortness of breath upon exertion:    Short of breath when lying flat:    Irregular heart rhythm:    Constitutional    Fever or chills:     PHYSICAL EXAM:   Vitals:   02/10/2021 1325 02/09/2021 1330  02/12/2021 1331 02/22/2021 1335  BP: (!) 91/46 (!) 91/44 (!) 92/54 (!) 108/56  Pulse: (!) 111 (!) 115 (!) 112 (!) 118  Resp: (!) 26 (!) 23 (!) 24 (!) 22  Temp:      TempSrc:      SpO2: 98% 96% 98% 97%  Weight:      Height:        GENERAL: The patient is a markedly debilitated female, in moderate distress. The vital signs are documented above. CARDIOVASCULAR: There is a regular rate and rhythm. PULMONARY: There is good air exchange bilaterally without wheezing or rales. VASCULAR: Both feet are cool with mottling in her lower legs and drainage from her feet. ABDOMEN: She has moderate abdominal tenderness.  DATA:   LABS: Her lactic acid is 11.  Hemoglobin is 12.2.  CT ANGIOGRAM CHEST ABDOMEN PELVIS: There is no evidence of rupture of her aneurysm.  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  The SMA appears to be patent.       Deitra Mayo [facility] [place] [phone]

## 2021-02-13 NOTE — Consult Note (Signed)
Consultation Note Date: 02/14/2021   Patient Name: Kathryn Hancock  DOB: 07/20/1950  MRN: [id number]  Age / Sex: 70 y.o., female  PCP: Gala Lewandowsky, MD Referring Physician: Laurin Coder, MD  Reason for Consultation: Establishing goals of care "multiple co-morbidities and poor prognosis, septic, severe peripheral artery disease. Known to the service from previous hospitalization"  HPI/Patient Profile: 70 y.o. female  with past medical history of ESRD on dialysis, diabetes type 2, severe peripheral arterial disease, severe vasculopathy with IMA occlusion, abdominal aortic aneurysm (not a surgical candidate), and paroxysmal atrial fibrillation. She presented to the emergency department on 02/22/2021 with altered mental status.  On arrival to the ED, there was significant concern for sepsis. Labs showed WBC of 20 and lactic acid greater than 11. Patient became hypotensive despite fluid resuscitation. Central line placed and started on levophed while in the ED. Admitted to PCCM for management of septic shock.   Of note, patient is closely followed by vascular surgery. Prior to admission she was scheduled for bilateral AKA for non-healing wounds on her bilateral lower extremities and left foot gangrene.   Clinical Assessment and Goals of Care: I have reviewed medical records including EPIC notes, labs and imaging, and examined the patient at bedside. Levophed infusion at 5 mcg/hour. Over the course of my time at bedside, this is titrated up to 10 mcg/hr.  On my assessment, patient is lethargic. She will arouse briefly to answer simple questions but has limited ability to participate in [place] discussion at this time.   Daughter [name] is at bedside. She shares that most people call her "Kathryn Hancock", but that her mother and other family members call her "Becky". We remain at bedside to discuss diagnosis, prognosis, and goals of  care.  Patient is known to PMT from her previous hospitalizations in June and July 2022.  I re-introduced Palliative Medicine as specialized medical care for people living with serious illness. It focuses on providing relief from the symptoms and stress of a serious illness.   We discussed a brief life review of the patient. [name] grew up on a farm in [place]. The land has been passed down through several generations dating back before the Civil War. She was married and they had 2 children, a daughter Kathryn Hancock) and a son. According to Kathryn Hancock, her parents had "a storybook romance". 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   After her hospitalization 11/25/20 - 12/13/20, she was discharged to SNF for rehab. She was hospitalized again 01/01/21 - 01/10/21, and discharged back to SNF. She returned to her home approximately 2-3 weeks ago. Kathryn Hancock and her 2 sons (patient's grandsons) live at the house with her.  As far as functional status, she was able to ambulate with a rollator.   We discussed patient's current illness and what it means in the larger context of her ongoing co-morbidities. I provided education on septic shock and multiple organ dysfunction, emphasizing high mortality rate. [name] verbalizes understanding that her mother is critically ill. She is  tearful as she states "I know where this is headed; I don't think she will be coming home".  Discussed that AKA surgery would not even be considered unless she stabilizes, and even then would be high risk due to her markedly debilitated stated.   Discussed values and goals of care important to the patient. In previous discussions with PMT, Mrs. Nestler has been clear that she wants full scope treatment. Her goal was to have as much time as possible with her daughter and grandchildren.   The difference between full scope medical intervention and comfort care was considered in light of the patient's goals of care. For now, Kathryn Hancock is trying to honor her  mother's wishes and is interested in all full scope medical interventions offered. We did discuss code status. At this time, Kathryn Hancock is clear to continue with full code status, including CPR, defibrillation, ACLS medications, and intubation. I provided education that prognosis would be poor in the event of a resuscitation event - [name] verbalizes understanding. Kathryn Hancock is clear that her mother would not want long-term mechanical ventilation.   Additional time spent at bedside in therapeutic listening as Kathryn Hancock shares the loss experienced within their family and the special bond she has with her mother. [name]'s father passed away in [id number] from "complications of agent orange". After his death, she and her mother became even closer. More recently, [name] son died tragically of a drug overdose in April 2021. Kathryn Hancock shares that this loss has made her a "realist".   Discussed the importance of continued conversation with the medical team regarding overall plan of care and treatment options, ensuring decisions are within the context of the patients values and GOCs.   Primary decision maker: [name] Kathryn Hancock) Owens Shark, daughter     SUMMARY OF RECOMMENDATIONS   Full code, full scope Patient would not want long-term mechanical ventilation PMT will continue to follow and support   Code Status/Advance Care Planning: Full code  Palliative Prophylaxis:  Oral Care and Turn Reposition  Additional Recommendations (Limitations, Scope, Preferences): Full Scope Treatment  Prognosis:  Poor in the setting of septic shock and multiple co-morbidities  Discharge Planning: To Be Determined      Primary Diagnoses: Present on Admission:  Sepsis ([place])   I have reviewed the medical record, interviewed the patient and family, and examined the patient. The following aspects are pertinent.  Past Medical History:  Diagnosis Date   AAA (abdominal aortic aneurysm) ([place]) 06/2019   Multiple small pseudoaneurysmal projections of  the dominant aorta.  Distal abdominal aortic aneurysm 4.5 x 4.7 cm.  Greatest AP dimension of the infrarenal aorta is 4.9.  No evidence of thoracic aortic aneurysm or dissection.  Brief segment of proximal IMA occlusion.   Anemia    Coronary artery disease involving native coronary artery of native heart without angina pectoris 06/01/2020   Cardiac [facility] at [facility] 06/01/2020-preop for atrial myxoma resection -> severe proximal RCA and PDA -> had single-vessel CABG with SVG-PDA along with myxoma resection.   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 05/27/2020   ESRD (end stage renal disease) on dialysis ([place]) 06/2020   Progression of CKDIIIb to ESRD initially related to thromboembolic event from left atrial myxoma; complicated by perioperative hypotension-; now 1 on HD TU/TH/SAT @ [facility]   GERD (gastroesophageal reflux disease)    Heavy smoker (more than 20 cigarettes per day)    ~  2 ppd; since age 29 (76 pk yr) = has cut down to one half PPD.>   Hyperlipidemia associated with type 2 diabetes mellitus ([place]) 08/05/2020   Hypertension    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 = Status post resection December 23,2021 (done at [facility] because of no bed availability at [facility]   Microscopic hematuria    PAF (paroxysmal atrial fibrillation) ([place]) 07/01/2020   Initially noted postoperatively-left atrial myxoma resection and CABG x1.  Now on amiodarone and apixaban..   Peripheral vascular disease ([place])    Bilateral SFA & ATA occlusion.  2 V runoffvia Peroneal A & PTA. --> s/p L Fem-Pop (AK pop A) bypass (PFTE) 09/12/2020 - pending R Fem-AKPop bypass   Plantar wart of right foot    PONV (postoperative nausea and vomiting)      Family History  Problem Relation Age of Onset   Diabetes Mother     Diabetes Father    Heart attack Father 72   CAD Father    Hyperlipidemia Father    Hypertension Father    Scheduled Meds:  amiodarone  200 mg Oral Daily   apixaban  5 mg Oral BID   aspirin EC  81 mg Oral QPM   Darbepoetin Alfa  200 mcg Intravenous Q Mon-HD   pantoprazole (PROTONIX) IV  40 mg Intravenous QHS   [START ON 02/14/2021] vancomycin variable dose per unstable renal function (pharmacist dosing)   Does not apply See admin instructions   Continuous Infusions:  [START ON 02/14/2021] ceFEPime (MAXIPIME) IV     dextrose 75 mL/hr at 03/09/2021 1849   lactated ringers 150 mL/hr at 03/05/2021 1804   lactated ringers     norepinephrine (LEVOPHED) Adult infusion 10 mcg/min (02/12/2021 1852)   phenylephrine (NEO-SYNEPHRINE) Adult infusion 100 mcg/min (02/23/2021 1847)   PRN Meds:.bisacodyl, docusate sodium, ipratropium-albuterol, oxyCODONE, polyethylene glycol   Allergies  Allergen Reactions   Penicillins Anaphylaxis    Anaphylaxis as a child   Review of Systems  Unable to perform ROS: Acuity of condition   Physical Exam Vitals reviewed.  Constitutional:      General: She is not in acute distress.    Appearance: She is ill-appearing.  Cardiovascular:     Rate and Rhythm: Tachycardia present.  Pulmonary:     Effort: Pulmonary effort is normal.  Skin:    Comments: BLE wounds  Neurological:     Mental Status: She is lethargic.     Motor: Weakness present.    Vital Signs: BP (!) 89/53   Pulse (!) 144   Temp (!) 97.4 F (36.3 C) (Oral)   Resp 20   Ht 5\' 3"  (1.6 m)   Wt 63.5 kg   SpO2 97%   BMI 24.80 kg/m  Pain Scale: 0-10   Pain Score: 0-No pain   SpO2: SpO2: 97 % O2 Device:SpO2: 97 % O2 Flow Rate: .O2 Flow Rate (L/min): 2 L/min  IO: Intake/output summary:  Intake/Output Summary (Last 24 hours) at 03/01/2021 1945 Last data filed at 02/25/2021 1800 Gross per 24 hour  Intake 4717.34 ml  Output --  Net 4717.34 ml    Palliative Assessment/Data: PPS 10% (NPO  status)     Time In: 1745 Time Out: 1900 Time Total: 75 minutes Greater than 50%  of this time was spent counseling and coordinating care related to the above assessment and plan.  Signed by: Lavena Bullion, NP   Please  contact Palliative Medicine Team phone at [phone] for questions and concerns.  For individual provider: See Shea Evans

## 2021-02-13 NOTE — ED Notes (Signed)
Received pt, daughter at bedside. Pt eyes open, looks around. Pt able to state name and date of birth.

## 2021-02-13 NOTE — ED Notes (Signed)
Multiple wounds noted and are previously documented on Epic.

## 2021-02-13 NOTE — H&P (Addendum)
NAME:  Kathryn Hancock, MRN:  [id number], DOB:  [birth year], LOS: 0 ADMISSION DATE:  02/16/2021, CONSULTATION DATE:  03/07/2021 REFERRING MD: Dr. Almyra Free, CHIEF COMPLAINT: Sepsis  History of Present Illness:  70 year old lady who presented to the hospital today with altered mentation Daughter found her unresponsive, slumped over with difficulty breathing Did x-ray was okay the previous night Had been scheduled for bilateral above-knee amputation for nonhealing bilateral lower wounds, gangrene left foot She does have a sacral decubitus Severe vasculopathy-IMA occlusion, abdominal aortic aneurysm-not a surgical candidate, severe peripheral arterial disease  end-stage renal disease on dialysis, diabetes, left atrial myxoma, paroxysmal atrial fibrillation  Follows up with Dr. Scot Dock and was in the process of being scheduled for bilateral above-knee amputation next week-his documentation regarding patient's visits with him and recent surgery, embolic disease noted.  Palliative care was involved during recent hospitalization given overall poor prognosis, however daughter states at present the patient wants everything done, will not want to be on a ventilator long-term.  Pertinent  Medical History   Past Medical History:  Diagnosis Date   AAA (abdominal aortic aneurysm) ([place]) 06/2019   Multiple small pseudoaneurysmal projections of the dominant aorta.  Distal abdominal aortic aneurysm 4.5 x 4.7 cm.  Greatest AP dimension of the infrarenal aorta is 4.9.  No evidence of thoracic aortic aneurysm or dissection.  Brief segment of proximal IMA occlusion.   Anemia    Coronary artery disease involving native coronary artery of native heart without angina pectoris 06/01/2020   Cardiac [facility] at [facility] 06/01/2020-preop for atrial myxoma resection -> severe proximal RCA and PDA -> had single-vessel CABG with SVG-PDA along with myxoma resection.   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 05/27/2020   ESRD (end stage renal disease) on dialysis ([place]) 06/2020   Progression of CKDIIIb to ESRD initially related to thromboembolic event from left atrial myxoma; complicated by perioperative hypotension-; now 1 on HD TU/TH/SAT @ [place]   GERD (gastroesophageal reflux disease)    Heavy smoker (more than 20 cigarettes per day)    ~ 2 ppd; since age 67 (22 pk yr) = has cut down to one half PPD.>   Hyperlipidemia associated with type 2 diabetes mellitus ([place]) 08/05/2020   Hypertension    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 = Status post resection December 23,2021 (done at [facility] because of no bed availability at [facility]   Microscopic hematuria    PAF (paroxysmal atrial fibrillation) ([place]) 07/01/2020   Initially noted postoperatively-left atrial myxoma resection and CABG x1.  Now on amiodarone and apixaban..   Peripheral vascular disease ([place])    Bilateral SFA & ATA occlusion.  2 V runoffvia Peroneal A & PTA. --> s/p L Fem-Pop (AK pop A) bypass (PFTE) 09/12/2020 - pending R Fem-AKPop bypass   Plantar wart of right foot    PONV (postoperative nausea and vomiting)      Significant Hospital Events: Including procedures, antibiotic start and stop dates in addition to other pertinent events   Septic, altered mental status, requiring pressors Vancomycin and cefepime started 03/04/2021  Interim History / Subjective:  Elderly lady, generally uncomfortable moaning with general discomfort  Objective   Blood pressure 116/66, pulse (!) 132, temperature 97.6 F (36.4 C), temperature source Oral, resp. rate (!) 24, height 5\' 3"  (1.6 m),  weight 63.5 kg, SpO2 98 %.        Intake/Output Summary (Last 24 hours) at 02/26/2021 1428 Last data filed at 02/27/2021 1326 Gross per 24 hour  Intake 3497.91 ml  Output --   Net 3497.91 ml   Filed Weights   03/08/2021 1042  Weight: 63.5 kg    Examination: General: Elderly, chronically ill-appearing HENT: Dry oral mucosa Lungs: Decreased air entry bilaterally Cardiovascular: S1-S2 appreciated, tachycardic Abdomen: Soft, bowel sounds appreciated Extremities: Mottling in both lower extremities, gangrene left foot, toes amputated on her right Neuro: Lethargic GU: ESRD patient on dialysis   Elevated lactate CT Abdomen and pelvis-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 of the IMA  Resolved Hospital Problem list     Assessment & Plan:  Sepsis Septic shock -Nonhealing lower extremity wounds, gangrene foot, sacral decubitus -Continue pressors -Continue antibiotics -Follow cultures -Maintain MAP greater than 65  Altered mental status -Secondary to sepsis -Continue aggressive support for sepsis  End-stage renal disease Stage III chronic kidney disease -Will continue with dialysis  Type 2 diabetes -SSI  Paroxysmal atrial fibrillation -Continue Eliquis at present  Obstructive lung disease -Bronchodilators  Hepatic dysfunction with elevated transaminases and INR  Coronary artery disease, peripheral arterial disease Severe vasculopathy with an occluded IMA, high-grade celiac stenosis  Sacral decubitus -Unable to review the extent of it at the present time as patient is in severe pain and discomfort  Severe protein calorie malnutrition  Continue aggressive resuscitative efforts, multiple pathology, multiple comorbidities, multiple organ dysfunction with very poor reserves-prognosis is poor. Unlikely to survive any kind of surgical intervention at the present time  Will consult palliative care as this may be the most appropriate    Best Practice (right click and "Reselect all SmartList Selections" daily)   Diet/type: Regular consistency (see orders) DVT prophylaxis: other GI prophylaxis: PPI Lines: Central line Foley:  Yes,  and it is still needed Code Status:  full code Last date of multidisciplinary goals of care discussion [discussed with daughter at bedside 9/5]  Labs   CBC: Recent Labs  Lab 03/02/2021 1014 03/02/2021 1030  WBC 20.2*  --   NEUTROABS 19.6*  --   HGB 10.8* 12.2  HCT 36.5 36.0  MCV 102.0*  --   PLT 144*  --     Basic Metabolic Panel: Recent Labs  Lab 02/12/2021 1014 02/25/2021 1030  NA 135 132*  K 3.9 3.8  CL 90* 97*  CO2 9*  --   GLUCOSE 76 70  BUN 26* 25*  CREATININE 5.68* 5.50*  CALCIUM 8.7*  --    GFR: Estimated Creatinine Clearance: 8.8 mL/min (A) (by C-G formula based on SCr of 5.5 mg/dL (H)). Recent Labs  Lab 02/14/2021 1014 03/02/2021 1019 03/02/2021 1215  WBC 20.2*  --   --   LATICACIDVEN  --  >11.0* >11.0*    Liver Function Tests: Recent Labs  Lab 03/08/2021 1014  AST 366*  ALT 128*  ALKPHOS 113  BILITOT 0.8  PROT 4.9*  ALBUMIN 1.5*   No results for input(s): LIPASE, AMYLASE in the last 168 hours. No results for input(s): AMMONIA in the last 168 hours.  ABG    Component Value Date/Time   TCO2 12 (L) 02/09/2021 1030     Coagulation Profile: Recent Labs  Lab 02/22/2021 1014  INR 5.9*    Cardiac Enzymes: No results for input(s): CKTOTAL, CKMB, CKMBINDEX, TROPONINI in the last 168 hours.  HbA1C: Hgb A1c MFr Bld  Date/Time Value Ref Range Status  12/06/2020 05:07 AM 5.0 4.8 - 5.6 % Final    Comment:    (NOTE)         Prediabetes: 5.7 - 6.4         Diabetes: >6.4         Glycemic control for adults with diabetes: <7.0   09/29/2020 05:51 PM 11.1 (H) 4.8 - 5.6 % Final    Comment:    CREDITED DUE TO QR (NOTE) Pre diabetes:          5.7%-6.4%  Diabetes:              >6.4%  Glycemic control for   <7.0% adults with diabetes     CBG: Recent Labs  Lab 02/12/2021 1016 02/14/2021 1224 02/12/2021 1234 02/20/2021 1302 02/19/2021 1319  GLUCAP 74 <10* 41* 228* 214*    Review of Systems:   Unobtainable  Past Medical History:  She,  has a past  medical history of AAA (abdominal aortic aneurysm) ([place]) (06/2019), Anemia, Coronary artery disease involving native coronary artery of native heart without angina pectoris (06/01/2020), Depression, DM (diabetes mellitus), type 2 with renal complications ([place]) (63/14/9702), Dry gangrene ([place]) -> right foot-toes (05/27/2020), ESRD (end stage renal disease) on dialysis ([place]) (06/2020), GERD (gastroesophageal reflux disease), Heavy smoker (more than 20 cigarettes per day), Hyperlipidemia associated with type 2 diabetes mellitus ([place]) (08/05/2020), Hypertension, LEFTATRIAL MYXOMA (06/2019), Microscopic hematuria, PAF (paroxysmal atrial fibrillation) ([place]) (07/01/2020), Peripheral vascular disease ([place]), Plantar wart of right foot, and PONV (postoperative nausea and vomiting).   Surgical History:   Past Surgical History:  Procedure Laterality Date   ABDOMINAL AORTOGRAM W/LOWER EXTREMITY N/A 07/08/2020   Procedure: ABDOMINAL AORTOGRAM W/LOWER EXTREMITY;  Surgeon: Angelia Mould, MD;  Location: [place] CV LAB:  2 R Renal & 1 L Renal A. Known Para-Ren  Aneurysm. Bilat Com, Internal & External Iliacs - CFA& DFA patent.  Bilat SFA 100% @ origin - recon @ AK Pop A.; Bilateral 2 V runnoff - Bilateral Peroneal A & PTA patent w/ Bilat ATA CTO.   AV FISTULA PLACEMENT Left 07/11/2020   Procedure: LEFT FIRST STAGE BRACHIOBASILIC UPPER EXTREMITY ARTERIOVENOUS (AV) FISTULA CREATION;  Surgeon: [name], MD;  Location: [place];  Service: Vascular;  Laterality: Left;   BASCILIC VEIN TRANSPOSITION Left 10/03/2020   Procedure: BASILIC VEIN TRANSPOSITION SECOND STAGE LEFT;  Surgeon: Angelia Mould, MD;  Location: [place];  Service: Vascular;  Laterality: Left;   BIOPSY  10/01/2020   Procedure: BIOPSY;  Surgeon: Jackquline Denmark, MD;  [facility] END[facility]COPY;  Service: Endoscopy;;   Cardiac MRI  01/22/2020   Normal LV size and function.  Moderate focal basal septal hypertrophy.  No S.A.M.  LVEF 61%.   RVEF 66%.  Large mobile mass in the left atrium attached to the interatrial septum-does not appear to thrombus characteristics consistent with left atrial myxoma.   Chest CTA  06/2019   Multiple small pseudoaneurysmal projections of the dominant aorta.  Distal abdominal aortic aneurysm 4.5 x 4.7 cm.  Greatest AP dimension of the infrarenal aorta is 4.9.  No evidence of thoracic aortic aneurysm or dissection.  Brief segment of proximal IMA occlusion.  Large geographic filling defect of the left atrium with associated dense radiopaque material.  Aortic atherosclerosis and emphysema   COLONOSCOPY W/ POLYPECTOMY     CORONARY ARTERY BYPASS GRAFT  06/02/2020   @ [facility] - Dr. Norm Parcel; SVG-rPDA along with Atrial Myxoma Resection.   ESOPHAGOGASTRODUODENOSCOPY (EGD) WITH PROPOFOL N/A 10/01/2020  Procedure: ESOPHAGOGASTRODUODENOSCOPY (EGD) WITH PROPOFOL;  Surgeon: Jackquline Denmark, MD;  Location: [facility] ENDOSCOPY;  Service: Endoscopy;  Laterality: N/A;   EYE SURGERY Bilateral    Cataract surgery   FEMORAL-POPLITEAL BYPASS GRAFT Left 09/12/2020   Procedure: LEFT FEMORAL-ABOVE KNEE POPLITEAL ARTERY BYPASS GRAFT;  Surgeon: Angelia Mould, MD;  Location: [place];  Service: Vascular;  Laterality: Left;   FEMORAL-POPLITEAL BYPASS GRAFT Right 10/31/2020   Procedure: RIGHT BYPASS GRAFT FEMORAL- ABOVE KNEE POPLITEAL ARTERY;  Surgeon: Angelia Mould, MD;  Location: [place];  Service: Vascular;  Laterality: Right;   HOT HEMOSTASIS N/A 10/01/2020   Procedure: HOT HEMOSTASIS (ARGON PLASMA COAGULATION/BICAP);  Surgeon: Jackquline Denmark, MD;  Location[facility] ENDOSCOPY;  Service: Endoscopy;  Laterality: N/A;   I & D EXTREMITY Right 11/28/2020   Procedure: IRRIGATION AND DEBRIDEMENT RIGHT UPPER LEG WOUND;  Surgeon: Angelia Mould, MD;  Location: [facility] OR;  Service: Vascular;  Laterality: Right;   INTRAOPERATIVE TRANSESOPHAGEAL ECHOCARDIOGRAM  06/02/2020   [facility] (LA Myxoma Resection & CABG x 1): Pre-op EF> 55%. Normal WM.  Mild AI. Large LA mass with calcifications &minimal vascularity (5.92 x 4 cm @ largest. LA dilation. Diffuse atherosclerosis of descending Ao, focal calcification of Sinus of  Valsalva. -> Post-op. S/p Mass Excision - no residual mass in LA. No ASD or PFO.  EF >55% no WMA.   IR FLUORO GUIDE CV LINE RIGHT  07/06/2020   IR US GUIDE VASC ACCESS RIGHT  07/06/2020   LEFT ATRIAL MYXOMA RESECTION Left 06/02/2020   [facility]: Dr. Norm Parcel   LEFT HEART CATH AND CORONARY ANGIOGRAPHY  05/2020   [facility]: Biplane Coronary Angiography, Significant Prox RCA & rPDA disease. Normal CO/CI with elevated LVEDP.   TRANSESOPHAGEAL ECHOCARDIOGRAM  05/31/2020   [facility]:  Normal LV function.  No LAA thrombus.  Large mobile left atrial mass attached to the left atrial septum (4.8 x 3.1 cm with calcification)-most consistent with atrial myxoma.  EF estimated 55%.  Normal valves.   TRANSMETATARSAL AMPUTATION Right 11/28/2020   Procedure: RIGHT TRANSMETATARSAL AMPUTATION;  Surgeon: Angelia Mould, MD;  Location[facility];  Service: Vascular;  Laterality: Right;   TRANSTHORACIC ECHOCARDIOGRAM  07/2019    EF 60 to 65%.  GR 1 DD.  Elevated LVEDP.  Large size ill-defined echodensity in the left atrium suspicious for thrombus versus tumor.  Moderately dilated LA.   TRANSTHORACIC ECHOCARDIOGRAM  08/31/2020   (Postop left atrial myxoma resection) normal LVEF of 55-60%.  No R WMA.  GR 1 DD.  Mildly reduced RV function with normal PAP.  Trivial AI.  Left atrial myxoma no longer present.  Mitral valve stable trivial MR.   TUBAL LIGATION       Social History:   reports that she has been smoking cigarettes. She has a 25.00 pack-year smoking history. She has never used smokeless tobacco. She reports that she does not currently use alcohol. She reports that she does not use drugs.   Family History:  Her family history includes CAD in her father; Diabetes in her father and mother; Heart attack (age of onset: 28) in her father; Hyperlipidemia in  her father; Hypertension in her father.   Allergies Allergies  Allergen Reactions   Penicillins Anaphylaxis    Anaphylaxis as a child     Home Medications  Prior to Admission medications   Medication Sig Start Date End Date Taking? Authorizing Provider  acetaminophen (TYLENOL) 650 MG CR tablet Take 650 mg by mouth every 8 (eight) hours as needed for  pain.    [provider]  amiodarone (PACERONE) 200 MG tablet Take 200 mg by mouth every evening. 06/10/20 06/10/21  [provider]  apixaban (ELIQUIS) 5 MG TABS tablet Take 1 tablet (5 mg total) by mouth 2 (two) times daily. 12/10/19   Leonie Man, MD  Ascorbic Acid (VITAMIN C) 1000 MG tablet Take 1,000 mg by mouth daily.    [provider]  aspirin EC 81 MG tablet Take 81 mg by mouth every evening. Swallow whole.    [provider]  atorvastatin (LIPITOR) 20 MG tablet Take 20 mg by mouth every evening.    [provider]  bisacodyl (DULCOLAX) 5 MG EC tablet Take 1 tablet (5 mg total) by mouth daily as needed for moderate constipation. 12/10/20   Mercy Riding, MD  Darbepoetin Alfa (ARANESP) 200 MCG/0.4ML SOSY injection Inject 0.4 mLs (200 mcg total) into the vein every Monday with hemodialysis. 12/12/20   Mercy Riding, MD  Dextrose, Diabetic Use, (INSTA-GLUCOSE) 77.4 % GEL Take 1 Dose by mouth daily as needed (BG<70).    [provider]  docusate sodium (COLACE) 100 MG capsule Take 100 mg by mouth daily.    [provider]  erythromycin ophthalmic ointment Place 1 application into the right eye 3 (three) times daily.    [provider]  famotidine (PEPCID) 20 MG tablet Take 1 tablet (20 mg total) by mouth 2 (two) times daily. 12/13/20   Geradine Girt, DO  fluticasone (FLONASE) 50 MCG/ACT nasal spray Place 2 sprays into both nostrils as needed for allergies or rhinitis.    [provider]  folic acid (FOLVITE) 759 MCG tablet Take 800 mcg by mouth daily.     [provider]  glucagon (GLUCAGEN) 1 MG SOLR injection Inject 1 mg into the vein once as needed for low blood sugar. BG<70    [provider]  glucose blood test strip 1 each by Other route as needed. Use as instructed    [provider]  hydrocortisone (ANUSOL-HC) 25 MG suppository Place 1 suppository (25 mg total) rectally 2 (two) times daily. 12/10/20   Mercy Riding, MD  insulin lispro (HUMALOG) 100 UNIT/ML KwikPen Inject 0-6 Units into the skin in the morning, at noon, in the evening, and at bedtime. If 70-150=0 units, 151-200=1 unit, 201-250=2 units, 251-300=3 units, 301-350=4 units, 351-400=6 units over 400 Call MD    [provider]  lidocaine-prilocaine (EMLA) cream Apply 1 application topically daily as needed (dialysis). 08/12/20   [provider]  linagliptin (TRADJENTA) 5 MG TABS tablet Take 5 mg by mouth every evening.    [provider]  loperamide (IMODIUM A-D) 2 MG tablet Take 2 mg by mouth 4 (four) times daily as needed for diarrhea or loose stools.    [provider]  metoCLOPramide (REGLAN) 5 MG tablet Take 1 tablet (5 mg total) by mouth every 8 (eight) hours as needed for refractory nausea / vomiting. 12/10/20 12/10/21  Mercy Riding, MD  montelukast (SINGULAIR) 10 MG tablet Take 10 mg by mouth daily as needed (allergies).    [provider]  multivitamin (RENA-VIT) TABS tablet Take 1 tablet by mouth at bedtime. 12/10/20   Mercy Riding, MD  Nutritional Supplements (,FEEDING SUPPLEMENT, PROSOURCE PLUS) liquid Take 30 mLs by mouth 3 (three) times daily between meals. 12/10/20   Mercy Riding, MD  ondansetron (ZOFRAN) 4 MG tablet Take 1 tablet (4 mg total) by mouth daily  as needed for up to 365 doses for nausea or vomiting. 12/10/20   Mercy Riding, MD  oxyCODONE (OXY IR/ROXICODONE) 5 MG immediate release tablet Take 1 tablet (5 mg total) by mouth every 6 (six) hours as needed for severe pain. 01/09/21   Elgergawy, Silver Huguenin, MD   sevelamer carbonate (RENVELA) 800 MG tablet Take 1 tablet (800 mg total) by mouth 3 (three) times daily with meals. 12/10/20   Mercy Riding, MD  traZODone (DESYREL) 50 MG tablet Take 1 tablet (50 mg total) by mouth at bedtime as needed for sleep. 12/10/20   Mercy Riding, MD    The patient is critically ill with multiple organ systems failure and requires high complexity decision making for assessment and support, frequent evaluation and titration of therapies, application of advanced monitoring technologies and extensive interpretation of multiple databases. Critical Care Time devoted to patient care services described in this note independent of APP/resident time (if applicable)  is 40 minutes.   Sherrilyn Rist MD [facility] Personal pager: See Amion If unanswered, please page CCM On-call: [phone]

## 2021-02-13 NOTE — Progress Notes (Signed)
Pharmacy Antibiotic Note  Kathryn Hancock is a 70 y.o. female admitted on 03/06/2021 with sepsis - unknown source.  Pharmacy has been consulted for vancomycin, aztreonam dosing. Noted hx of PCN "anaphylaxis as a child" allergy, but per pharmacy protocol, aztreonam changed to cefepime as patient has tolerated this medication here multiple times in the past.  Hx ESRD on HD MWF (tolerated full tx on Friday). Prior to admission was scheduled for upcoming bilateral BKA  Plan: Discontinue aztreonam >> cefepime per protocol Cefepime 2g IV x 1; then 1g IV q24h Vancomycin 1500mg  IV x 1, then 750mg  IV qHD (not yet scheduled) Flagyl 500mg  IV x 1 per EDP - fu if to continue Monitor clinical progress, c/s, abx plan/LOT Pre-HD vancomycin level as indicated F/u HD schedule/tolerance inpatient to enter antibiotic maintenance doses     Temp (24hrs), Avg:97.6 F (36.4 C), Min:97.6 F (36.4 C), Max:97.6 F (36.4 C)  No results for input(s): WBC, CREATININE, LATICACIDVEN, VANCOTROUGH, VANCOPEAK, VANCORANDOM, GENTTROUGH, GENTPEAK, GENTRANDOM, TOBRATROUGH, TOBRAPEAK, TOBRARND, AMIKACINPEAK, AMIKACINTROU, AMIKACIN in the last 168 hours.  CrCl cannot be calculated (Patient's most recent lab result is older than the maximum 21 days allowed.).    Allergies  Allergen Reactions   Penicillins Anaphylaxis    Anaphylaxis as a child    Arturo Morton, PharmD, BCPS Please check AMION for all [facility] contact numbers Clinical Pharmacist 03/02/2021 10:32 AM

## 2021-02-13 NOTE — ED Triage Notes (Addendum)
Pt arrives with daughter-woke up altered. Reports was still communicating normally on Saturday, noticed lethargy yesterday, and woke up this morning non-communicative and unable to follow most commands. Scheduled for bilateral AKA upcoming. MWF dialysis patient, had full tx on Friday.

## 2021-02-13 NOTE — ED Notes (Signed)
Verified with Dr. Ander Slade PO meds ordered. Per MD, nothing to be given if she is not able to swallow safely.

## 2021-02-13 NOTE — ED Notes (Signed)
Unable to get a good pleth/pulse ox reading, multiple sites attempted. EDP made aware, blood gas ordered, pt on 3L [place]

## 2021-02-13 NOTE — ED Notes (Signed)
EDP aware of pts BP 

## 2021-02-13 NOTE — ED Provider Notes (Addendum)
[facility] EMERGENCY DEPARTMENT Provider Note   CSN: [id number] Arrival date & time: 03/05/2021  1000     History Chief Complaint  Patient presents with   Altered Mental Status    Kathryn Hancock is a 70 y.o. female.  Patient presents to the ER chief complaint of altered mental status.  She lives at home with her daughter, who states that she was normal last night but found her this morning unresponsive slumped over but being able to breathe on her own.  No reports of recent illnesses or 2 fevers or cough or vomiting or diarrhea.  Patient has significant vascular history is followed by vascular surgery.  She has severe peripheral vascular disease and fusiform aortic aneurysms.  She was last seen in vascular clinic this week and scheduled for bilateral lower extremity amputations next week.      Past Medical History:  Diagnosis Date   AAA (abdominal aortic aneurysm) ([place]) 06/2019   Multiple small pseudoaneurysmal projections of the dominant aorta.  Distal abdominal aortic aneurysm 4.5 x 4.7 cm.  Greatest AP dimension of the infrarenal aorta is 4.9.  No evidence of thoracic aortic aneurysm or dissection.  Brief segment of proximal IMA occlusion.   Anemia    Coronary artery disease involving native coronary artery of native heart without angina pectoris 06/01/2020   Cardiac cath at [facility] 06/01/2020-preop for atrial myxoma resection -> severe proximal RCA and PDA -> had single-vessel CABG with SVG-PDA along with myxoma resection.   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 05/27/2020   ESRD (end stage renal disease) on dialysis ([place]) 06/2020   Progression of CKDIIIb to ESRD initially related to thromboembolic event from left atrial myxoma; complicated by perioperative hypotension-; now 1 on HD TU/TH/SAT @ [place]   GERD (gastroesophageal reflux disease)     Heavy smoker (more than 20 cigarettes per day)    ~ 2 ppd; since age 83 (24 pk yr) = has cut down to one half PPD.>   Hyperlipidemia associated with type 2 diabetes mellitus ([place]) 08/05/2020   Hypertension    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 = Status post resection December 23,2021 (done at [facility] because of no bed availability at [facility]   Microscopic hematuria    PAF (paroxysmal atrial fibrillation) ([place]) 07/01/2020   Initially noted postoperatively-left atrial myxoma resection and CABG x1.  Now on amiodarone and apixaban..   Peripheral vascular disease ([place])    Bilateral SFA & ATA occlusion.  2 V runoffvia Peroneal A & PTA. 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   Limb ischemia 11/25/2020   Pressure injury of skin 11/01/2020   Iron deficiency anemia, unspecified 10/22/2020   Malnutrition of moderate degree 09/30/2020   Current use of long term anticoagulation    Weakness    Anemia 09/29/2020   Personal history of COVID-19 09/28/2020   COVID-19 08/22/2020   Hyperlipidemia associated with type 2 diabetes mellitus ([place]) 08/05/2020   Hypokalemia  07/27/2020   Unspecified abnormal findings in urine 07/26/2020   Unspecified severe protein-calorie malnutrition ([place]) 07/21/2020   Encounter for immunization 07/19/2020   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[place]) 07/16/2020   Coagulation defect, unspecified ([place]) 07/16/2020   Allergy, unspecified, initial encounter 07/15/2020   Anaphylactic shock, unspecified, initial encounter 07/15/2020   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   Pain, unspecified 07/15/2020    Pruritus, unspecified 07/15/2020   Secondary hyperparathyroidism of renal origin ([place]) 07/15/2020   Shortness of breath 07/15/2020   PAOD (peripheral arterial occlusive disease) ([place]) 07/08/2020   ESRD (end stage renal disease) on dialysis ([place]) 07/01/2020   PAF (paroxysmal atrial fibrillation) (HCC) -  CHA2DS2-VASc Score 5 (Age, Female, HTN, DM, Vascular)- On Eliquis 07/01/2020   Anemia of chronic disease 07/01/2020   Essential hypertension 07/01/2020   DM (diabetes mellitus), type 2 with renal complications ([place]) 93/73/4287   Cellulitis of toe 06/05/2020   S/P CABG (coronary artery bypass graft) 06/02/2020   Coronary artery disease involving native coronary artery of native heart without angina pectoris 06/01/2020   CKD (chronic kidney disease) stage 3, GFR 30-59 ml/min ([place]) 06/01/2020   HLD (hyperlipidemia) 06/01/2020   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 Known Para-Ren  Aneurysm. Bilat Com, Internal & External Iliacs - CFA& DFA patent.  Bilat SFA 100% @ origin - recon @ AK Pop A.; Bilateral 2 V runnoff - Bilateral Peroneal A & PTA patent w/ Bilat ATA CTO.   AV FISTULA PLACEMENT Left 07/11/2020   Procedure: LEFT FIRST STAGE BRACHIOBASILIC UPPER EXTREMITY ARTERIOVENOUS (AV) FISTULA CREATION;  Surgeon: [name], MD;  Location: [place];  Service: Vascular;  Laterality: Left;   BASCILIC VEIN TRANSPOSITION Left 10/03/2020   Procedure: BASILIC VEIN TRANSPOSITION SECOND STAGE LEFT;  Surgeon: Angelia Mould, MD;  Location: [place];  Service: Vascular;  Laterality: Left;   BIOPSY   10/01/2020   Procedure: BIOPSY;  Surgeon: Jackquline Denmark, MD;  Location: [facility] ENDOSCOPY;  Service: Endoscopy;;   Cardiac MRI  01/22/2020   Normal LV size and function.  Moderate focal basal septal hypertrophy.  No S.A.M.  LVEF 61%.  RVEF 66%.  Large mobile mass in the left atrium attached to the interatrial septum-does not appear to thrombus characteristics consistent with left atrial myxoma.   Chest CTA  06/2019   Multiple small pseudoaneurysmal projections of the dominant aorta.  Distal abdominal aortic aneurysm 4.5 x 4.7 cm.  Greatest AP dimension of the infrarenal aorta is 4.9.  No evidence of thoracic aortic aneurysm or dissection.  Brief segment of proximal IMA occlusion.  Large geographic filling defect of the left atrium with associated dense radiopaque material.  Aortic atherosclerosis and emphysema   COLONOSCOPY W/ POLYPECTOMY     CORONARY ARTERY BYPASS GRAFT  06/02/2020   @ [facility] - Dr. Norm Parcel; SVG-rPDA along with Atrial Myxoma Resection.   ESOPHAGOGASTRODUODENOSCOPY (EGD) WITH PROPOFOL N/A 10/01/2020   Procedure: ESOPHAGOGASTRODUODENOSCOPY (EGD) WITH PROPOFOL;  Surgeon: Jackquline Denmark, MD;  Location: [facility] ENDOSCOPY;  Service: Endoscopy;  Laterality: N/A;   EYE SURGERY Bilateral    Cataract surgery   FEMORAL-POPLITEAL BYPASS GRAFT Left  09/12/2020   Procedure: LEFT FEMORAL-ABOVE KNEE POPLITEAL ARTERY BYPASS GRAFT;  Surgeon: Angelia Mould, MD;  Location: [place];  Service: Vascular;  Laterality: Left;   FEMORAL-POPLITEAL BYPASS GRAFT Right 10/31/2020   Procedure: RIGHT BYPASS GRAFT FEMORAL- ABOVE KNEE POPLITEAL ARTERY;  Surgeon: Angelia Mould, MD;  Location: [place];  Service: Vascular;  Laterality: Right;   HOT HEMOSTASIS N/A 10/01/2020   Procedure: HOT HEMOSTASIS (ARGON PLASMA COAGULATION/BICAP);  Surgeon: Jackquline Denmark, MD;  Location: [facility] ENDOSCOPY;  Service: Endoscopy;  Laterality: N/A;   I & D EXTREMITY Right 11/28/2020   Procedure: IRRIGATION AND DEBRIDEMENT RIGHT UPPER LEG  WOUND;  Surgeon: Angelia Mould, MD;  [facility];  Service: Vascular;  Laterality: Right;   INTRAOPERATIVE TRANSESOPHAGEAL ECHOCARDIOGRAM  06/02/2020   [facility] (LA Myxoma Resection & CABG x 1): Pre-op EF> 55%. Normal WM. Mild AI. Large LA mass with calcifications &minimal vascularity (5.92 x 4 cm @ largest. LA dilation. Diffuse atherosclerosis of descending Ao, focal calcification of Sinus of  Valsalva. -> Post-op. S/p Mass Excision - no residual mass in LA. No ASD or PFO.  EF >55% no WMA.   IR FLUORO GUIDE CV LINE RIGHT  07/06/2020   IR US GUIDE VASC ACCESS RIGHT  07/06/2020   LEFT ATRIAL MYXOMA RESECTION Left 06/02/2020   [facility]: Dr. Norm Parcel   LEFT HEART CATH AND CORONARY ANGIOGRAPHY  05/2020   [facility]: Biplane Coronary Angiography, Significant Prox RCA & rPDA disease. Normal CO/CI with elevated LVEDP.   TRANSESOPHAGEAL ECHOCARDIOGRAM  05/31/2020   [facility]:  Normal LV function.  No LAA thrombus.  Large mobile left atrial mass attached to the left atrial septum (4.8 x 3.1 cm with calcification)-most consistent with atrial myxoma.  EF estimated 55%.  Normal valves.   TRANSMETATARSAL AMPUTATION Right 11/28/2020   Procedure: RIGHT TRANSMETATARSAL AMPUTATION;  Surgeon: Angelia Mould, MD;  [facility];  Service: Vascular;  Laterality: Right;   TRANSTHORACIC ECHOCARDIOGRAM  07/2019    EF 60 to 65%.  GR 1 DD.  Elevated LVEDP.  Large size ill-defined echodensity in the left atrium suspicious for thrombus versus tumor.  Moderately dilated LA.   TRANSTHORACIC ECHOCARDIOGRAM  08/31/2020   (Postop left atrial myxoma resection) normal LVEF of 55-60%.  No R WMA.  GR 1 DD.  Mildly reduced RV function with normal PAP.  Trivial AI.  Left atrial myxoma no longer present.  Mitral valve stable trivial MR.   TUBAL LIGATION       OB History   No obstetric history on file.     Family History  Problem Relation Age of Onset   Diabetes Mother    Diabetes Father    Heart attack Father 9   CAD  Father    Hyperlipidemia Father    Hypertension Father     Social History   Tobacco Use   Smoking status: Every Day    Packs/day: 0.50    Years: 50.00    Pack years: 25.00    Types: Cigarettes   Smokeless tobacco: Never  Vaping Use   Vaping Use: Never used  Substance Use Topics   Alcohol use: Not Currently   Drug use: Never    Home Medications Prior to Admission medications   Medication Sig Start Date End Date Taking? Authorizing Provider  acetaminophen (TYLENOL) 650 MG CR tablet Take 650 mg by mouth every 8 (eight) hours as needed for pain.    [provider]  amiodarone (PACERONE) 200 MG tablet Take 200 mg  by mouth every evening. 06/10/20 06/10/21  [provider]  apixaban (ELIQUIS) 5 MG TABS tablet Take 1 tablet (5 mg total) by mouth 2 (two) times daily. 12/10/19   Leonie Man, MD  Ascorbic Acid (VITAMIN C) 1000 MG tablet Take 1,000 mg by mouth daily.    [provider]  aspirin EC 81 MG tablet Take 81 mg by mouth every evening. Swallow whole.    [provider]  atorvastatin (LIPITOR) 20 MG tablet Take 20 mg by mouth every evening.    [provider]  bisacodyl (DULCOLAX) 5 MG EC tablet Take 1 tablet (5 mg total) by mouth daily as needed for moderate constipation. 12/10/20   Mercy Riding, MD  Darbepoetin Alfa (ARANESP) 200 MCG/0.4ML SOSY injection Inject 0.4 mLs (200 mcg total) into the vein every Monday with hemodialysis. 12/12/20   Mercy Riding, MD  Dextrose, Diabetic Use, (INSTA-GLUCOSE) 77.4 % GEL Take 1 Dose by mouth daily as needed (BG<70).    [provider]  docusate sodium (COLACE) 100 MG capsule Take 100 mg by mouth daily.    [provider]  erythromycin ophthalmic ointment Place 1 application into the right eye 3 (three) times daily.    [provider]  famotidine (PEPCID) 20 MG tablet Take 1 tablet (20 mg total) by mouth 2 (two) times daily. 12/13/20   Geradine Girt, DO  fluticasone  (FLONASE) 50 MCG/ACT nasal spray Place 2 sprays into both nostrils as needed for allergies or rhinitis.    [provider]  folic acid (FOLVITE) 742 MCG tablet Take 800 mcg by mouth daily.    [provider]  glucagon (GLUCAGEN) 1 MG SOLR injection Inject 1 mg into the vein once as needed for low blood sugar. BG<70    [provider]  glucose blood test strip 1 each by Other route as needed. Use as instructed    [provider]  hydrocortisone (ANUSOL-HC) 25 MG suppository Place 1 suppository (25 mg total) rectally 2 (two) times daily. 12/10/20   Mercy Riding, MD  insulin lispro (HUMALOG) 100 UNIT/ML KwikPen Inject 0-6 Units into the skin in the morning, at noon, in the evening, and at bedtime. If 70-150=0 units, 151-200=1 unit, 201-250=2 units, 251-300=3 units, 301-350=4 units, 351-400=6 units over 400 Call MD    [provider]  lidocaine-prilocaine (EMLA) cream Apply 1 application topically daily as needed (dialysis). 08/12/20   [provider]  linagliptin (TRADJENTA) 5 MG TABS tablet Take 5 mg by mouth every evening.    [provider]  loperamide (IMODIUM A-D) 2 MG tablet Take 2 mg by mouth 4 (four) times daily as needed for diarrhea or loose stools.    [provider]  metoCLOPramide (REGLAN) 5 MG tablet Take 1 tablet (5 mg total) by mouth every 8 (eight) hours as needed for refractory nausea / vomiting. 12/10/20 12/10/21  Mercy Riding, MD  montelukast (SINGULAIR) 10 MG tablet Take 10 mg by mouth daily as needed (allergies).    [provider]  multivitamin (RENA-VIT) TABS tablet Take 1 tablet by mouth at bedtime. 12/10/20   Mercy Riding, MD  Nutritional Supplements (,FEEDING SUPPLEMENT, PROSOURCE PLUS) liquid Take 30 mLs by mouth 3 (three) times daily between meals. 12/10/20   Mercy Riding, MD  ondansetron (ZOFRAN) 4 MG tablet Take 1 tablet (4 mg total) by mouth daily as needed for up to 365 doses for nausea or vomiting.  12/10/20   Cyndia Skeeters,  Charlesetta Ivory, MD  oxyCODONE (OXY IR/ROXICODONE) 5 MG immediate release tablet Take 1 tablet (5 mg total) by mouth every 6 (six) hours as needed for severe pain. 01/09/21   Elgergawy, Silver Huguenin, MD  sevelamer carbonate (RENVELA) 800 MG tablet Take 1 tablet (800 mg total) by mouth 3 (three) times daily with meals. 12/10/20   Mercy Riding, MD  traZODone (DESYREL) 50 MG tablet Take 1 tablet (50 mg total) by mouth at bedtime as needed for sleep. 12/10/20   Mercy Riding, MD    Allergies    Penicillins  Review of Systems   Review of Systems  Unable to perform ROS: Mental status change   Physical Exam Updated Vital Signs BP (!) 107/56   Pulse (!) 138   Temp 97.6 F (36.4 C) (Oral)   Resp (!) 21   Ht 5\' 3"  (1.6 m)   Wt 63.5 kg   SpO2 100%   BMI 24.80 kg/m   Physical Exam Constitutional:      General: She is in acute distress.     Appearance: She is ill-appearing.     Comments: Obtunded, unresponsive  HENT:     Head: Normocephalic.     Nose: Nose normal.  Eyes:     Comments: Pupils are dilated 4, sluggish but responsive to light.  Cardiovascular:     Rate and Rhythm: Tachycardia present.  Pulmonary:     Effort: Pulmonary effort is normal.  Musculoskeletal:        General: Normal range of motion.  Neurological:     Comments: Obtunded, unresponsive    ED Results / Procedures / Treatments   Labs (all labs ordered are listed, but only abnormal results are displayed) Labs Reviewed  LACTIC ACID, PLASMA - Abnormal; Notable for the following components:      Result Value   Lactic Acid, Venous >11.0 (*)    All other components within normal limits  LACTIC ACID, PLASMA - Abnormal; Notable for the following components:   Lactic Acid, Venous >11.0 (*)    All other components within normal limits  COMPREHENSIVE METABOLIC PANEL - Abnormal; Notable for the following components:   Chloride 90 (*)    CO2 9 (*)    BUN 26 (*)    Creatinine, Ser 5.68 (*)    Calcium 8.7 (*)     Total Protein 4.9 (*)    Albumin 1.5 (*)    AST 366 (*)    ALT 128 (*)    GFR, Estimated 8 (*)    Anion gap 36 (*)    All other components within normal limits  CBC WITH DIFFERENTIAL/PLATELET - Abnormal; Notable for the following components:   WBC 20.2 (*)    RBC 3.58 (*)    Hemoglobin 10.8 (*)    MCV 102.0 (*)    MCHC 29.6 (*)    RDW 18.6 (*)    Platelets 144 (*)    Neutro Abs 19.6 (*)    Lymphs Abs 0.4 (*)    All other components within normal limits  PROTIME-INR - Abnormal; Notable for the following components:   Prothrombin Time 52.9 (*)    INR 5.9 (*)    All other components within normal limits  APTT - Abnormal; Notable for the following components:   aPTT 41 (*)    All other components within normal limits  CBG MONITORING, ED - Abnormal; Notable for the following components:   Glucose-Capillary 59 (*)    All other components within normal  limits  I-STAT CHEM 8, ED - Abnormal; Notable for the following components:   Sodium 132 (*)    Chloride 97 (*)    BUN 25 (*)    Creatinine, Ser 5.50 (*)    Calcium, Ion 1.01 (*)    TCO2 12 (*)    All other components within normal limits  CBG MONITORING, ED - Abnormal; Notable for the following components:   Glucose-Capillary <10 (*)    All other components within normal limits  CBG MONITORING, ED - Abnormal; Notable for the following components:   Glucose-Capillary 41 (*)    All other components within normal limits  CBG MONITORING, ED - Abnormal; Notable for the following components:   Glucose-Capillary 228 (*)    All other components within normal limits  CBG MONITORING, ED - Abnormal; Notable for the following components:   Glucose-Capillary 214 (*)    All other components within normal limits  RESP PANEL BY RT-PCR (FLU A&B, COVID) ARPGX2  CULTURE, BLOOD (ROUTINE X 2)  CULTURE, BLOOD (ROUTINE X 2)  URINE CULTURE  URINALYSIS, ROUTINE W REFLEX MICROSCOPIC  MISCELLANEOUS GENETIC TEST  CBG MONITORING, ED    EKG EKG  Interpretation  Date/Time:  Monday February 13 2021 10:10:28 EDT Ventricular Rate:  125 PR Interval:    QRS Duration: 117 QT Interval:  350 QTC Calculation: 505 R Axis:   78 Text Interpretation: Atrial flutter with predominant 2:1 AV block LVH with secondary repolarization abnormality Borderline ST elevation, inferior leads Prolonged QT interval Confirmed by Thamas Jaegers ([id number]) on 03/05/2021 10:23:12 AM  Radiology CT HEAD WO CONTRAST (5MM)  Result Date: 03/01/2021 CLINICAL DATA:  Mental status change.  Unknown cause. EXAM: CT HEAD WITHOUT CONTRAST TECHNIQUE: Contiguous axial images were obtained from the base of the skull through the vertex without intravenous contrast. COMPARISON:  CT head without contrast 01/01/2021 FINDINGS: Brain: No acute infarct, hemorrhage, or mass lesion is present. Remote lacunar infarcts are present in the internal and external capsules bilaterally. Basal ganglia are intact. No acute infarct, hemorrhage, or mass lesion is present. Mild atrophy and white matter changes are noted. The ventricles are of normal size. The brainstem and cerebellum are within normal limits. Vascular: Atherosclerotic calcifications are present within the cavernous internal carotid arteries bilaterally. No hyperdense vessel is present. Skull: Calvarium is intact. No focal lytic or blastic lesions are present. No significant extracranial soft tissue lesion is present. Sinuses/Orbits: The paranasal sinuses and mastoid air cells are clear. Bilateral lens replacements are noted. Globes and orbits are otherwise unremarkable. IMPRESSION: 1. No acute intracranial abnormality. 2. 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 3. Mild atrophy and white matter disease likely reflects the sequela of chronic microvascular ischemia. Electronically Signed   By: San Morelle M.D.   On: 02/20/2021 12:20   DG Chest Port 1 View  Result Date: 02/10/2021 CLINICAL DATA:  Lethargy,  sepsis and end-stage renal disease. EXAM: PORTABLE CHEST 1 VIEW COMPARISON:  01/01/2021 FINDINGS: Stable heart size. Prior CABG. Tunneled dialysis catheter via the right jugular vein shows stable positioning in the distal SVC. Pacing pads present. There is no evidence of pulmonary edema, consolidation, pneumothorax or pleural fluid. IMPRESSION: No acute findings.  Stable positioning of dialysis catheter. Electronically Signed   By: Aletta Edouard M.D.   On: 02/20/2021 11:17   CT Angio Chest/Abd/Pel for Dissection W and/or Wo Contrast  Result Date: 03/05/2021 CLINICAL DATA:  Abdominal pain, pulsatile aortic mass, question abdominal aneurysm or dissection EXAM: CT ANGIOGRAPHY  CHEST, ABDOMEN AND PELVIS TECHNIQUE: Non-contrast CT of the chest was initially obtained. Multidetector CT imaging through the chest, abdomen and pelvis was performed using the standard protocol during bolus administration of intravenous contrast. Multiplanar reconstructed images and MIPs were obtained and reviewed to evaluate the vascular anatomy. CONTRAST:  157mL OMNIPAQUE IOHEXOL 350 MG/ML SOLN IV. No oral contrast. COMPARISON:  01/01/2021 FINDINGS: CTA CHEST FINDINGS Cardiovascular: Extensive atherosclerotic calcifications aorta, proximal great vessels, coronary arteries. Aorta normal caliber. No intramural hematoma on precontrast imaging. Heart unremarkable. No pericardial effusion. Post CABG. Following contrast, thoracic aorta distends normally without aneurysm or dissection. Pulmonary arteries well opacified and patent without evidence of pulmonary embolism. Mediastinum/Nodes: Small amount of radiodense material question medication within esophagus. Esophagus otherwise unremarkable. Base of cervical region normal appearance. No thoracic adenopathy. Lungs/Pleura: Dependent atelectasis posterior lower lobes. Emphysematous changes at apices. No acute infiltrate, pleural effusion, or pneumothorax. Osseous demineralization. Prior median  sternotomy. Musculoskeletal: Osseous demineralization.  Prior median sternotomy. Review of the MIP images confirms the above findings. CTA ABDOMEN AND PELVIS FINDINGS VASCULAR Aorta: Extensive atherosclerotic calcifications. Tortuous abdominal aorta. Multiple saccular aneurysms of abdominal aorta, largest including 20 x 11 mm mid abdominal aorta. 19 x 16 mm posterior mid abdominal aorta, 23 x 13 mm posterior mid to distal abdominal aorta, and 28 x 15 posterior distal abdominal aorta. Probable saccular aneurysm RIGHT lateral distal abdominal aorta 2.0 x 2.0 cm image 215. Irregular fusiform dilatation of distal abdominal aorta 6.1 x 5.0 cm image 202 extending 6 cm length, unchanged. Penetrating ulcer posterior mid abdominal aorta at the level of the SMA. Small saccular aneurysm of retrocrural proximal abdominal aorta 16 x 10 mm to LEFT. No perianeurysmal hemorrhage. 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 Renals: High-grade narrowing of proximal renal arteries bilaterally greater than 50% narrowing. 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 LEFT common iliac artery 11 mm diameter. Calcified plaques throughout the common, internal, and external iliac arteries. Calcifications at the common femoral arteries and bifurcations. Veins: Unopacified at time of CTA imaging. Review of the MIP images confirms the above findings. NON-VASCULAR Hepatobiliary: Cholelithiasis, calculi up to 11 mm diameter. Gallbladder and liver otherwise normal appearance Pancreas: Atrophic pancreas without mass Spleen: Normal appearance Adrenals/Urinary Tract: BILATERAL adrenal adenomas, 2.4 x 1.5 cm RIGHT and 2.0 x 1.7 cm LEFT. Patchy renal and enhancement. No renal mass or hydronephrosis. Bladder and ureters unremarkable. Stomach/Bowel: Minimal sigmoid diverticulosis without  evidence of diverticulitis. Mild wall thickening of descending colon question mild colitis versus artifact from underdistention. Stomach and small bowel loops unremarkable. Normal appendix. Lymphatic: No adenopathy Reproductive: Uterus and ovaries normal appearance Other: No free air or free fluid. Scattered subcutaneous edema. No hernia. Musculoskeletal: Diffuse osseous demineralization. Review of the MIP images confirms the above findings. IMPRESSION: Stable aneurysm of distal abdominal aorta measuring 6.1 x 5.0 cm axial dimensions and extending 6.0 cm length. Multiple saccular aneurysms of the abdominal aorta as above. Penetrating ulcer posteriorly at proximal abdominal aorta. No perianeurysmal or para-aortic infiltration/hemorrhage identified. Extensive atherosclerotic calcifications of aorta and coronary arteries. 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 Cholelithiasis. BILATERAL adrenal adenomas. Minimal sigmoid diverticulosis without diverticulitis. Emphysema (ICD10-J43.9). Aortic Atherosclerosis (ICD10-I70.0). Aortic aneurysm NOS (ICD10-I71.9). Electronically Signed   By: Lavonia Dana M.D.   On: 03/07/2021 12:37    Procedures .Central Line  Date/Time: 03/07/2021 2:45 PM  Performed by: [name], MD Authorized by: [name], MD   Comments:     Left femoral central line placed by myself with maximal sterile barrier technique.  Seldinger technique with ultrasound guidance used.  Central line was secured with suture and dressing. .Critical Care  Date/Time: 02/24/2021 2:46 PM Performed by: [name], MD Authorized by: [name], MD   Critical care provider statement:    Critical care time (minutes):  30   Critical care time was exclusive of:  Separately billable procedures and treating other patients and teaching time   Critical care was necessary to treat or prevent imminent or life-threatening deterioration of the following conditions:   Shock and sepsis   Medications Ordered in ED Medications  lactated ringers infusion ( Intravenous New Bag/Given 02/11/2021 1108)  ceFEPIme (MAXIPIME) 1 g in sodium chloride 0.9 % 100 mL IVPB (has no administration in time range)  vancomycin variable dose per unstable renal function (pharmacist dosing) (has no administration in time range)  lactated ringers infusion ( Intravenous Not Given 02/09/2021 1109)  dextrose 10 % infusion ( Intravenous New Bag/Given 03/07/2021 1309)  norepinephrine (LEVOPHED) 4mg  in 230mL premix infusion (4 mcg/min Intravenous Rate/Dose Change 03/03/2021 1330)  docusate sodium (COLACE) capsule 100 mg (has no administration in time range)  polyethylene glycol (MIRALAX / GLYCOLAX) packet 17 g (has no administration in time range)  pantoprazole (PROTONIX) injection 40 mg (has no administration in time range)  lactated ringers bolus 1,000 mL (0 mLs Intravenous Stopped 02/17/2021 1114)  metroNIDAZOLE (FLAGYL) IVPB 500 mg (0 mg Intravenous Stopped 02/19/2021 1140)  vancomycin (VANCOREADY) IVPB 1500 mg/300 mL (0 mg Intravenous Stopped 03/07/2021 1326)  dextrose 50 % solution (50 mLs  Given 03/08/2021 1224)  ceFEPIme (MAXIPIME) 2 g in sodium chloride 0.9 % 100 mL IVPB (0 g Intravenous Stopped 03/08/2021 1108)  lactated ringers bolus 1,000 mL (0 mLs Intravenous Stopped 03/07/2021 1145)  iohexol (OMNIPAQUE) 350 MG/ML injection 100 mL (100 mLs Intravenous Contrast Given 02/26/2021 1206)  lactated ringers bolus 1,000 mL (0 mLs Intravenous Stopped 02/28/2021 1309)  dextrose 50 % solution 25 g (25 g Intravenous Given 02/14/2021 1240)    ED Course  I have reviewed the triage vital signs and the nursing notes.  Pertinent labs & imaging results that were available during my care of the patient were reviewed by me and considered in my medical decision making (see chart for details).    MDM Rules/Calculators/A&P                           On patient arrival, significant concern for sepsis.  Sepsis work-up initiated.  Pulsatile  abdominal mass palpated on exam.  Labs show white count of 20 and lactic acid greater than 11.  Patient had blood culture sent and broad-spectrum antibiotic started.  Greater than 30 cc/kg IV fluid resuscitation provided.  Initial blood pressure appears normal, but became hypotensive while in the ER.  She is a dialysis patient with right upper extremity dialysis catheter in place.  Left lower extremity central line placed by myself.  Patient started on IV Levophed.  Case discussed with vascular surgery Dr.Dickson, who will have his team see the patient.  He states the patient needs to be stabilized more if they are to consider amputation surgery.  Consultation to ICU.    Final Clinical Impression(s) / ED Diagnoses Final diagnoses:  Severe sepsis ([place])  Peripheral vascular disease ([place])  Aortic aneurysm  without rupture, unspecified portion of aorta [facility])    Rx / DC Orders ED Discharge Orders     None        [name], MD 02/22/2021 1444    [name], MD 03/01/2021 1446

## 2021-02-14 DIAGNOSIS — R579 Shock, unspecified: Secondary | ICD-10-CM | POA: Diagnosis not present

## 2021-02-14 DIAGNOSIS — A419 Sepsis, unspecified organism: Secondary | ICD-10-CM | POA: Diagnosis not present

## 2021-02-14 DIAGNOSIS — L89159 Pressure ulcer of sacral region, unspecified stage: Secondary | ICD-10-CM | POA: Diagnosis not present

## 2021-02-14 DIAGNOSIS — E1122 Type 2 diabetes mellitus with diabetic chronic kidney disease: Secondary | ICD-10-CM | POA: Diagnosis not present

## 2021-02-14 DIAGNOSIS — N186 End stage renal disease: Secondary | ICD-10-CM | POA: Diagnosis not present

## 2021-02-14 DIAGNOSIS — I96 Gangrene, not elsewhere classified: Secondary | ICD-10-CM | POA: Diagnosis not present

## 2021-02-14 DIAGNOSIS — I739 Peripheral vascular disease, unspecified: Secondary | ICD-10-CM | POA: Diagnosis not present

## 2021-02-14 LAB — BASIC METABOLIC PANEL
Anion gap: 16 — ABNORMAL HIGH (ref 5–15)
BUN: 26 mg/dL — ABNORMAL HIGH (ref 8–23)
CO2: 16 mmol/L — ABNORMAL LOW (ref 22–32)
Calcium: 7.4 mg/dL — ABNORMAL LOW (ref 8.9–10.3)
Chloride: 99 mmol/L (ref 98–111)
Creatinine, Ser: 4.27 mg/dL — ABNORMAL HIGH (ref 0.44–1.00)
GFR, Estimated: 11 mL/min — ABNORMAL LOW (ref >60)
Glucose, Bld: 214 mg/dL — ABNORMAL HIGH (ref 70–99)
Potassium: 3.1 mmol/L — ABNORMAL LOW (ref 3.5–5.1)
Sodium: 131 mmol/L — ABNORMAL LOW (ref 135–145)

## 2021-02-14 LAB — CBC
HCT: 26.3 % — ABNORMAL LOW (ref 36.0–46.0)
Hemoglobin: 8.2 g/dL — ABNORMAL LOW (ref 12.0–15.0)
MCH: 30.1 pg (ref 26.0–34.0)
MCHC: 31.2 g/dL (ref 30.0–36.0)
MCV: 96.7 fL (ref 80.0–100.0)
Platelets: 88 10*3/uL — ABNORMAL LOW (ref 150–400)
RBC: 2.72 MIL/uL — ABNORMAL LOW (ref 3.87–5.11)
RDW: 18.5 % — ABNORMAL HIGH (ref 11.5–15.5)
WBC: 20 10*3/uL — ABNORMAL HIGH (ref 4.0–10.5)
nRBC: 0.5 % — ABNORMAL HIGH (ref 0.0–0.2)

## 2021-02-14 LAB — HEPATIC FUNCTION PANEL
ALT: 88 U/L — ABNORMAL HIGH (ref 0–44)
AST: 143 U/L — ABNORMAL HIGH (ref 15–41)
Albumin: <1 g/dL — ABNORMAL LOW (ref 3.5–5.0)
Alkaline Phosphatase: 89 U/L (ref 38–126)
Bilirubin, Direct: 0.2 mg/dL (ref 0.0–0.2)
Indirect Bilirubin: 0.4 mg/dL (ref 0.3–0.9)
Total Bilirubin: 0.6 mg/dL (ref 0.3–1.2)
Total Protein: 3.1 g/dL — ABNORMAL LOW (ref 6.5–8.1)

## 2021-02-14 LAB — PROTIME-INR
INR: 2.9 — ABNORMAL HIGH (ref 0.8–1.2)
Prothrombin Time: 30.5 seconds — ABNORMAL HIGH (ref 11.4–15.2)

## 2021-02-14 LAB — LACTIC ACID, PLASMA
Lactic Acid, Venous: 8.3 mmol/L (ref 0.5–1.9)
Lactic Acid, Venous: 7 mmol/L (ref 0.5–1.9)

## 2021-02-14 LAB — TSH: TSH: 1.455 u[IU]/mL (ref 0.350–4.500)

## 2021-02-14 LAB — GLUCOSE, CAPILLARY
Glucose-Capillary: 192 mg/dL — ABNORMAL HIGH (ref 70–99)
Glucose-Capillary: 179 mg/dL — ABNORMAL HIGH (ref 70–99)
Glucose-Capillary: 163 mg/dL — ABNORMAL HIGH (ref 70–99)
Glucose-Capillary: 131 mg/dL — ABNORMAL HIGH (ref 70–99)

## 2021-02-14 LAB — CORTISOL: Cortisol, Plasma: 66.3 ug/dL

## 2021-02-14 LAB — MAGNESIUM: Magnesium: 1.3 mg/dL — ABNORMAL LOW (ref 1.7–2.4)

## 2021-02-14 LAB — PHOSPHORUS: Phosphorus: 3.8 mg/dL (ref 2.5–4.6)

## 2021-02-14 MED ORDER — ACETAMINOPHEN 650 MG RE SUPP: 650.0000 mg | Freq: Four times a day (QID) | RECTAL | Status: DC | PRN

## 2021-02-14 MED ORDER — GLYCOPYRROLATE 0.2 MG/ML IJ SOLN: 0.2000 mg | INTRAMUSCULAR | Status: DC | PRN

## 2021-02-14 MED ORDER — MORPHINE SULFATE (PF) 2 MG/ML IV SOLN
2.0000 mg | INTRAVENOUS | Status: DC | PRN
  Administered 2021-02-14: 2 mg via INTRAVENOUS

## 2021-02-14 MED ORDER — POLYVINYL ALCOHOL 1.4 % OP SOLN
1.0000 [drp] | Freq: Four times a day (QID) | OPHTHALMIC | Status: DC | PRN
  Filled 2021-02-14: qty 15

## 2021-02-14 MED ORDER — ACETAMINOPHEN 325 MG PO TABS: 650.0000 mg | ORAL_TABLET | Freq: Four times a day (QID) | ORAL | Status: DC | PRN

## 2021-02-14 MED ORDER — MORPHINE 100MG IN NS 100ML (1MG/ML) PREMIX INFUSION
0.0000 mg/h | INTRAVENOUS | Status: DC
  Administered 2021-02-14: 5 mg/h via INTRAVENOUS
  Administered 2021-02-14 – 2021-02-15 (×2): 10 mg/h via INTRAVENOUS
  Filled 2021-02-14 (×3): qty 100

## 2021-02-14 MED ORDER — SODIUM CHLORIDE 0.9% FLUSH: 10.0000 mL | INTRAVENOUS | Status: DC | PRN

## 2021-02-14 MED ORDER — POTASSIUM CHLORIDE 20 MEQ PO PACK: 40.0000 meq | PACK | Freq: Once | ORAL | Status: DC

## 2021-02-14 MED ORDER — GLYCOPYRROLATE 1 MG PO TABS: 1.0000 mg | ORAL_TABLET | ORAL | Status: DC | PRN

## 2021-02-14 MED ORDER — AMIODARONE HCL IN DEXTROSE 360-4.14 MG/200ML-% IV SOLN
60.0000 mg/h | INTRAVENOUS | Status: DC
  Filled 2021-02-14: qty 200

## 2021-02-14 MED ORDER — SODIUM CHLORIDE 0.9 % IV SOLN
INTRAVENOUS | Status: DC | PRN
  Administered 2021-02-14: 500 mL via INTRAVENOUS

## 2021-02-14 MED ORDER — DIPHENHYDRAMINE HCL 50 MG/ML IJ SOLN: 25.0000 mg | INTRAMUSCULAR | Status: DC | PRN

## 2021-02-14 MED ORDER — POTASSIUM CHLORIDE 10 MEQ/100ML IV SOLN
10.0000 meq | INTRAVENOUS | Status: AC
  Administered 2021-02-14: 10 meq via INTRAVENOUS
  Filled 2021-02-14: qty 100

## 2021-02-14 MED ORDER — HALOPERIDOL LACTATE 5 MG/ML IJ SOLN: 2.5000 mg | INTRAMUSCULAR | Status: DC | PRN

## 2021-02-14 MED ORDER — AMIODARONE HCL IN DEXTROSE 360-4.14 MG/200ML-% IV SOLN: 30.0000 mg/h | INTRAVENOUS | Status: DC

## 2021-02-14 MED ORDER — CHLORHEXIDINE GLUCONATE CLOTH 2 % EX PADS
6.0000 | MEDICATED_PAD | Freq: Every day | CUTANEOUS | Status: DC
  Administered 2021-02-14: 6 via TOPICAL

## 2021-02-14 MED ORDER — HEPARIN (PORCINE) 25000 UT/250ML-% IV SOLN: 900.0000 [IU]/h | INTRAVENOUS | Status: DC

## 2021-02-14 MED ORDER — GLYCOPYRROLATE 0.2 MG/ML IJ SOLN
0.2000 mg | INTRAMUSCULAR | Status: DC | PRN
  Administered 2021-02-14: 0.2 mg via INTRAVENOUS
  Filled 2021-02-14: qty 1

## 2021-02-14 MED ORDER — MAGNESIUM SULFATE 2 GM/50ML IV SOLN
2.0000 g | Freq: Once | INTRAVENOUS | Status: AC
  Administered 2021-02-14: 2 g via INTRAVENOUS
  Filled 2021-02-14: qty 50

## 2021-02-14 MED ORDER — SODIUM CHLORIDE 0.9% FLUSH
10.0000 mL | Freq: Two times a day (BID) | INTRAVENOUS | Status: DC
  Administered 2021-02-14 (×2): 10 mL

## 2021-02-14 MED ORDER — MORPHINE BOLUS VIA INFUSION
5.0000 mg | INTRAVENOUS | Status: DC | PRN
  Filled 2021-02-14: qty 5

## 2021-02-14 MED ORDER — DEXTROSE 5 % IV SOLN: INTRAVENOUS | Status: DC

## 2021-02-14 NOTE — Progress Notes (Signed)
D10 @ 67ml/hr paused with CCM made aware.

## 2021-02-14 NOTE — Progress Notes (Signed)
Daily Progress Note   Patient Name: Kathryn Hancock       Date: 02/14/2021 DOB: 20-Mar-1951  Age: 70 y.o. MRN#: [id number] Attending Physician: Kathryn Starr, MD Primary Care Physician: Kathryn Lewandowsky, MD Admit Date: 02/17/2021  Reason for Consultation/Follow-up: goals of care, psychosocial support  Subjective: Chart reviewed. Per Dr. August Hancock note from this morning, patient and her daughter spoke and patient expressed she wanted to transition to comfort care. Code status has been changed to DNR/DNI.  Patient appears comfortable. Respirations are even and unlabored. She is resting with her eyes closed, but will respond easily to voice. Morphine infusion is at 5 mg/hr. She remains on vasopressor support. Neosynephrine is at 186 mcg/min. SBP in the 70's.   Daughter Kathryn Hancock is at bedside. She is tearful as she states "she told me to let her go". This is a very difficult situation for her, but as her mother is completely lucid, she plans to honor her wishes. Kathryn Hancock tells me that her 3 sons were able to visit earlier and say their goodbyes. Patient has a 4th grandson (from her deceased son), who lives in [place] and is trying to get a flight into [place]. Kathryn Hancock states she doesn't want to delay the EOL process to wait for this grandson to get here.   Kathryn Hancock expresses she just wants her mother to be comfortable at this point. Discussed that the morphine could be increased as needed. Discussed weaning off the vasopressors, and that patient would not likely survive very long without vasopressor support.   Created space and opportunity for Kathryn Hancock to express thoughts and feelings regarding current situation. Discussed expectations at EOL. Emotional support provided Offered spiritual care support - [name] declines at this time.      Length of Stay: 1  Current Medications: Scheduled Meds:   aspirin EC  81 mg Oral QPM   Chlorhexidine Gluconate Cloth  6 each Topical Daily   Darbepoetin Alfa  200 mcg Intravenous Q Mon-HD   pantoprazole (PROTONIX) IV  40 mg Intravenous QHS   sodium chloride flush  10-40 mL Intracatheter Q12H   vancomycin variable dose per unstable renal function (pharmacist dosing)   Does not apply See admin instructions    Continuous Infusions:  sodium chloride Stopped (02/14/21 1105)   amiodarone     amiodarone Stopped (02/14/21 1630)  ceFEPime (MAXIPIME) IV Stopped (02/14/21 1800)   dextrose Stopped (02/14/21 0655)   dextrose     heparin     morphine 5 mg/hr (02/14/21 1400)   norepinephrine (LEVOPHED) Adult infusion Stopped (03/10/2021 1854)   phenylephrine (NEO-SYNEPHRINE) Adult infusion 226.6667 mcg/min (02/14/21 1200)   potassium chloride 100 mL/hr at 02/14/21 1200    PRN Meds: sodium chloride, acetaminophen **OR** acetaminophen, bisacodyl, diphenhydrAMINE, docusate sodium, glycopyrrolate **OR** glycopyrrolate **OR** glycopyrrolate, haloperidol lactate, ipratropium-albuterol, morphine injection, morphine, oxyCODONE, polyethylene glycol, polyvinyl alcohol, sodium chloride flush  Physical Exam Vitals reviewed.  Constitutional:      General: She is sleeping. She is not in acute distress.    Appearance: She is ill-appearing.     Comments: Frail  Pulmonary:     Effort: Pulmonary effort is normal.  Skin:    Comments: BLE wounds Sacral decubitus  Neurological:     Mental Status: She is oriented to person, place, and time and easily aroused.            Vital Signs: BP (!) 79/43   Pulse (!) 109   Temp (!) 97.4 F (36.3 C) (Oral)   Resp 19   Ht 5\' 3"  (1.6 m)   Wt 66.8 kg   SpO2 100%   BMI 26.09 kg/m  SpO2: SpO2: 100 % O2 Device: O2 Device: Nasal Cannula O2 Flow Rate: O2 Flow Rate (L/min): 5 L/min  Intake/output summary:  Intake/Output Summary (Last 24 hours) at 02/14/2021  1404 Last data filed at 02/14/2021 1200 Gross per 24 hour  Intake 6759.15 ml  Output --  Net 6759.15 ml   LBM: Last BM Date:  (PTA) Baseline Weight: Weight: 63.5 kg Most recent weight: Weight: 66.8 kg       Palliative Assessment/Data: PPS 20%       Palliative Care Assessment & Plan   HPI/Patient Profile: 70 y.o. female  with past medical history of ESRD on dialysis, diabetes type 2, severe peripheral arterial disease, severe vasculopathy with IMA occlusion, abdominal aortic aneurysm (not a surgical candidate), and paroxysmal atrial fibrillation. She presented to the emergency department on 03/05/2021 with altered mental status.  On arrival to the ED, there was significant concern for sepsis. Labs showed WBC of 20 and lactic acid greater than 11. Patient became hypotensive despite fluid resuscitation. Central line placed and started on levophed while in the ED. Admitted to PCCM for management of septic shock.    Of note, patient is closely followed by vascular surgery. Prior to admission she was scheduled for bilateral AKA for non-healing wounds on her bilateral lower extremities and left foot gangrene.   Assessment: - septic shock - non-healing wounds BLE, foot gangrene - sacral decubitus - ESRD on HD - severe vasculopathy with an occluded IMA - end of life care  Recommendations/Plan: Continue comfort based care DNR/DNI as previously documented Continue morphine infusion Wean vasopressors off PRN medications are available for symptom management at EOL PMT will continue to support  Goals of Care and Additional Recommendations: Limitations on Scope of Treatment: Full Comfort Care  Code Status:  Prognosis:  Hours - Days  Discharge Planning: Anticipated Hospital Death    Thank you for allowing the Palliative Medicine Team to assist in the care of this patient.   Total Time 35 minutes Prolonged Time Billed  no       Greater than 50%  of this time was spent  counseling and coordinating care related to the above assessment and plan.  Kathryn Bullion, NP  Please contact Palliative Medicine Team phone at [phone] for questions and concerns.

## 2021-02-14 NOTE — Consult Note (Addendum)
[facility] Nurse Consult Note: Patient receiving care in 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 Patient is currently in ICU and it is very painful for her to be turned and the skin assessed. She currently has necrotic toes on the left foot. The toes on the right foot have been amputated. She has a full thickness wound on the medial upper thigh on the right leg that is approx 0.3 cm in depth with scant drainage. Right trochanter is a stage 4 with is partially pink, yellow, tan with 50% slough. Left proximal trochanter is 100% eschar and dry with a smaller wound just below this that is 100% eschar but has some purulent drainage around the edges. Left distal trochanter is a stage 4 that is dusky gray with 50% slough. The coccyx is a large evolving DTPI that is very fragile with skin coming off as the sacral foam dressing is removed. The bilateral arms have large bruising areas and this skin is very fragile with skin coming off as foam dressings are removed.  Dressing procedure/placement/frequency: Multiple dressings will be required and will be as follows: Right trochanter-flush with NS, pat dry then apply a moistened saline gauze on the wound, cover with dry gauze and secure with foam dressing. Change gauze twice daily. Coccyx DTPI- apply Xeroform gauze over the red areas and secure with foam dressing. Change Xeroform gauze daily. May use foam dressing up to 3 days if not soiled. Left proximal trochanter- place a moistened saline gauze over this wound and over the Left distal trochanter, cover with dry gauze and foam dressing. Change twice daily.  Right medial upper thigh- continue foam  dressing, change every 3 days or PRN soiling Bilateral arms- place Xeroform gauze over the areas that are open and then secure with a Kerlix. No tape on the skin. Right LE stump- place Xeroform gauze over the open area and wrap the foot and leg up to the knee with Kerlix.  Left toes are to be left alone for now until vascular decides if she will have bilateral AKA or if she will change to comfort care.   Monitor the wound area(s) for worsening of condition such as: Signs/symptoms of infection, increase in size, development of or worsening of odor, development of pain, or increased pain at the affected locations.   Notify the medical team if any of these develop.  Thank you for the consult. [facility] nurse will not follow at this time.   Please re-consult the [facility] team if needed.  Cathlean Marseilles Tamala Julian, MSN, RN, [place], Lysle Pearl, [facility] Wound Treatment Associate Pager [phone]

## 2021-02-14 NOTE — Progress Notes (Signed)
[facility] referral started.    Referral number: [id number]  Patient appears comfortable at this time.  Daughter, [name], at bedside.

## 2021-02-14 NOTE — Progress Notes (Signed)
   VASCULAR SURGERY ASSESSMENT & PLAN:   NONHEALING WOUNDS BILATERAL LOWER EXTREMITIES: The only option that I would have would be bilateral above-the-knee amputations.  However, as per my note yesterday, given her markedly debilitated state, end-stage renal disease, nonhealing wounds of both feet, sacral decubitus, inoperable pararenal and abdominal aortic aneurysms, DM, CAD, and malnutrition I think that Palliative Care would be very appropriate.  Vascular surgery will continue to follow.  If she stabilizes and the family wishes to pursue the most aggressive approach and certainly we could consider bilateral above-the-knee amputations.  I still have not seen her sacral decubitus which could be another potential source of sepsis in addition to her dialysis catheter.  SUBJECTIVE:   Lethargic but arousable.  PHYSICAL EXAM:   Vitals:   02/14/21 0530 02/14/21 0545 02/14/21 0600 02/14/21 0615  BP: (!) 89/53 (!) 86/55 91/61 (!) 91/55  Pulse: (!) 105 97 (!) 104 (!) 112  Resp: 12 13 13 12   Temp:      TempSrc:      SpO2: 100% 98% 98% 98%  Weight:      Height:       Significant drainage from right leg with nonhealing wounds of both lower extremities.  LABS:   Lab Results  Component Value Date   WBC 20.0 (H) 02/14/2021   HGB 8.2 (L) 02/14/2021   HCT 26.3 (L) 02/14/2021   MCV 96.7 02/14/2021   PLT 88 (L) 02/14/2021   Lab Results  Component Value Date   CREATININE 5.50 (H) 02/09/2021   Lab Results  Component Value Date   INR 5.9 (HH) 02/19/2021   CBG (last 3)  Recent Labs    02/22/2021 2047 02/14/21 0117 02/14/21 0322  GLUCAP 221* 192* 179*    PROBLEM LIST:    Active Problems:   Sepsis ([place])   CURRENT MEDS:    amiodarone  200 mg Oral Daily   apixaban  5 mg Oral BID   aspirin EC  81 mg Oral QPM   Chlorhexidine Gluconate Cloth  6 each Topical Daily   Darbepoetin Alfa  200 mcg Intravenous Q Mon-HD   pantoprazole (PROTONIX) IV  40 mg Intravenous QHS   sodium chloride  flush  10-40 mL Intracatheter Q12H   vancomycin variable dose per unstable renal function (pharmacist dosing)   Does not apply See admin instructions    Deitra Mayo Office: [phone] 02/14/2021

## 2021-02-14 NOTE — Progress Notes (Signed)
NAME:  Kathryn Hancock, MRN:  [id number], DOB:  02-26-1951, LOS: 1 ADMISSION DATE:  02/25/2021, CONSULTATION DATE:  03/10/2021 REFERRING MD: Dr. Almyra Free, CHIEF COMPLAINT: Sepsis  History of Present Illness:  70 year old lady who presented to the hospital today with altered mentation Daughter found her unresponsive, slumped over with difficulty breathing Did x-ray was okay the previous night Had been scheduled for bilateral above-knee amputation for nonhealing bilateral lower wounds, gangrene left foot She does have a sacral decubitus Severe vasculopathy-IMA occlusion, abdominal aortic aneurysm-not a surgical candidate, severe peripheral arterial disease  end-stage renal disease on dialysis, diabetes, left atrial myxoma, paroxysmal atrial fibrillation  Follows up with Dr. Scot Dock and was in the process of being scheduled for bilateral above-knee amputation next week-his documentation regarding patient's visits with him and recent surgery, embolic disease noted.  Palliative care was involved during recent hospitalization given overall poor prognosis, however daughter states at present the patient wants everything done, will not want to be on a ventilator long-term.  Pertinent  Medical History   Past Medical History:  Diagnosis Date   AAA (abdominal aortic aneurysm) ([place]) 06/2019   Multiple small pseudoaneurysmal projections of the dominant aorta.  Distal abdominal aortic aneurysm 4.5 x 4.7 cm.  Greatest AP dimension of the infrarenal aorta is 4.9.  No evidence of thoracic aortic aneurysm or dissection.  Brief segment of proximal IMA occlusion.   Anemia    Coronary artery disease involving native coronary artery of native heart without angina pectoris 06/01/2020   Cardiac cath at [facility] 06/01/2020-preop for atrial myxoma resection -> severe proximal RCA and PDA -> had single-vessel CABG with SVG-PDA along with myxoma resection.   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 05/27/2020   ESRD (end stage renal disease) on dialysis ([place]) 06/2020   Progression of CKDIIIb to ESRD initially related to thromboembolic event from left atrial myxoma; complicated by perioperative hypotension-; now 1 on HD TU/TH/SAT @ [facility]   GERD (gastroesophageal reflux disease)    Heavy smoker (more than 20 cigarettes per day)    ~ 2 ppd; since age 70 (70 pk yr) = has cut down to one half PPD.>   Hyperlipidemia associated with type 2 diabetes mellitus ([place]) 08/05/2020   Hypertension    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 = Status post resection December 23,2021 (done at [facility] because of no bed availability at [facility]   Microscopic hematuria    PAF (paroxysmal atrial fibrillation) ([place]) 07/01/2020   Initially noted postoperatively-left atrial myxoma resection and CABG x1.  Now on amiodarone and apixaban..   Peripheral vascular disease ([place])    Bilateral SFA & ATA occlusion.  2 V runoffvia Peroneal A & PTA. --> s/p L Fem-Pop (AK pop A) bypass (PFTE) 09/12/2020 - pending R Fem-AKPop bypass   Plantar wart of right foot    PONV (postoperative nausea and vomiting)      Significant Hospital Events: Including procedures, antibiotic start and stop dates in addition to other pertinent events   9/5 admitted to the ICU for shock.    Antibiotics: 9/5 Cefepime >> 9/5 Vancomycin >> Interim History / Subjective:   Patient remains on vasopressor support.  In discomfort from her legs and back. Daughter is at the bedside. She and her mother spoke, the patient wishes to transition to  comfort measures.   Objective   Blood pressure (!) 99/48, pulse (!) 105, temperature (!) 97.5 F (36.4 C), temperature source Oral, resp. rate 12, height 5\' 3"  (1.6 m), weight 66.8 kg, SpO2 99 %.        Intake/Output  Summary (Last 24 hours) at 02/14/2021 0902 Last data filed at 02/14/2021 0600 Gross per 24 hour  Intake 9112.24 ml  Output --  Net 9112.24 ml   Filed Weights   03/09/2021 1042 02/14/21 0324  Weight: 63.5 kg 66.8 kg    Examination: General: Elderly, chronically ill-appearing HENT: moist mucous membranes Lungs: clear to auscultation bilaterally, no wheezing or rhonchi. Cardiovascular: S1-S2 appreciated, tachycardic - irregular Abdomen: Soft, bowel sounds appreciated Extremities: Mottling in both lower extremities, gangrene left foot, toes amputated on her right Neuro: [facility] Problem list     Assessment & Plan:  Sepsis Septic shock due to lower extremity and sacral wounds -Nonhealing lower extremity wounds, gangrene foot, sacral decubitus -Continue pressors until family has arrived -Continue antibiotics -Follow cultures -Maintain MAP greater than 65  Altered mental status  -Secondary to sepsis  End-stage renal disease on HD -No further dialysis as she is transitioning to comfort measures.  Type 2 diabetes Hypoglycemia -D5W   Anemia Thrombocytopenia - in setting of sepsis and chronic illness  Hypomagnesemia - replete  Paroxysmal atrial fibrillation -Transitioned to heparin this AM  Obstructive lung disease -Bronchodilators  Elevated transaminases Coagulopathy - Liver enzymes elevated in setting of septic shock - INR elevated due to eliquis but also component of shock - continue to monitor  Coronary artery disease, peripheral arterial disease - Severe vasculopathy with an occluded IMA, high-grade celiac stenosis - vascular surgery following, not recommending surgery for her lower extremities due to significant co-morbid conditions  Sacral decubitus -Unable to review the extent of it at the present time as patient is in severe pain and discomfort  Severe protein calorie malnutrition - nutrition following  Disposition: We will  transition patient to comfort measures as expressed by the patient. We will stop vasopressor support after family arrives.   Best Practice (right click and "Reselect all SmartList Selections" daily)   Diet/type: Regular consistency (see orders) DVT prophylaxis: other GI prophylaxis: PPI Lines: Central line Foley:  Yes, and it is still needed Code Status:  DNR Last date of multidisciplinary goals of care discussion [discussed with daughter at bedside 9/6, will transition to comfort measures.]  Labs   CBC: Recent Labs  Lab 03/03/2021 1014 03/04/2021 1030 02/14/21 0523  WBC 20.2*  --  20.0*  NEUTROABS 19.6*  --   --   HGB 10.8* 12.2 8.2*  HCT 36.5 36.0 26.3*  MCV 102.0*  --  96.7  PLT 144*  --  88*    Basic Metabolic Panel: Recent Labs  Lab 02/14/2021 1014 02/26/2021 1030 02/14/21 0523  NA 135 132* 131*  K 3.9 3.8 3.1*  CL 90* 97* 99  CO2 9*  --  16*  GLUCOSE 76 70 214*  BUN 26* 25* 26*  CREATININE 5.68* 5.50* 4.27*  CALCIUM 8.7*  --  7.4*  MG  --   --  1.3*  PHOS  --   --  3.8   GFR: Estimated Creatinine Clearance: 11.6 mL/min (A) (by C-G formula based on SCr of 4.27 mg/dL (H)). Recent Labs  Lab 03/08/2021 1014 02/14/2021 1019 02/25/2021 1215 02/24/2021 1710 02/14/21 0119 02/14/21 0523  WBC 20.2*  --   --   --   --  20.0*  LATICACIDVEN  --  >11.0* >11.0* >11.0* 8.3*  --     Liver Function Tests: Recent Labs  Lab 02/12/2021 1014 02/14/21 0523  AST 366* 143*  ALT 128* 88*  ALKPHOS 113 89  BILITOT 0.8 0.6  PROT 4.9* 3.1*  ALBUMIN 1.5* <1.0*   No results for input(s): LIPASE, AMYLASE in the last 168 hours. No results for input(s): AMMONIA in the last 168 hours.  ABG    Component Value Date/Time   TCO2 12 (L) 02/26/2021 1030     Coagulation Profile: Recent Labs  Lab 02/19/2021 1014  INR 5.9*    Cardiac Enzymes: No results for input(s): CKTOTAL, CKMB, CKMBINDEX, TROPONINI in the last 168 hours.  HbA1C: Hgb A1c MFr Bld  Date/Time Value Ref Range Status   12/06/2020 05:07 AM 5.0 4.8 - 5.6 % Final    Comment:    (NOTE)         Prediabetes: 5.7 - 6.4         Diabetes: >6.4         Glycemic control for adults with diabetes: <7.0   09/29/2020 05:51 PM 11.1 (H) 4.8 - 5.6 % Final    Comment:    CREDITED DUE TO QR (NOTE) Pre diabetes:          5.7%-6.4%  Diabetes:              >6.4%  Glycemic control for   <7.0% adults with diabetes     CBG: Recent Labs  Lab 02/14/2021 1625 02/26/2021 2047 02/14/21 0117 02/14/21 0322 02/14/21 0742  GLUCAP 192* 221* 192* 179* 163*    Critical Care Time: 60 minutes  Freda Jackson, MD [facility] Office: [phone]   See Amion for personal pager PCCM on call pager [phone] until 7pm. Please call [facility] 7p-7a. [phone]

## 2021-02-16 LAB — BLOOD CULTURE ID PANEL (REFLEXED) - BCID2

## 2021-02-18 LAB — CULTURE, BLOOD (ROUTINE X 2): Culture: NO GROWTH

## 2021-02-21 ENCOUNTER — Inpatient Hospital Stay (HOSPITAL_COMMUNITY): Admission: RE | Admit: 2021-02-21 | Payer: Medicare Other | Source: Home / Self Care | Admitting: Vascular Surgery

## 2021-02-21 SURGERY — AMPUTATION, ABOVE KNEE
Anesthesia: General | Site: Knee | Laterality: Bilateral

## 2021-03-11 NOTE — Death Summary Note (Signed)
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 year old female who was admitted on 02/14/21 with altered mentation and then found unresponsive at home by her daughter who brought her into the ER.  She was admitted to the ICU for concern of septic shock requiring vasopressor support.  She initially had lactic acid greater than 11.  The source of infection was thought to be her lower extremity wounds that has been nonhealing for some time as she followed with vascular surgery.  She also had numerous sacral decubitus ulcers.  She was started on broad-spectrum antibiotics.  Given her significant comorbidities and current state of shock vascular surgery deemed her not a candidate for bilateral below the knee amputations.  Discussions were had with the patient and her daughter about her prognosis and the risks of moving forward with more advanced forms of life support.  Based on the patient's wishes and the severity of her current illness she wished to be DNR/DNI and transition to comfort measures.  She was  transition to comfort measures on February 16, 2021 and she passed away on 02-16-2021 at 8:28 AM.  Pertinent Labs and Studies  Significant Diagnostic Studies CT HEAD WO CONTRAST (5MM)  Result Date: Feb 14, 2021 CLINICAL DATA:  Mental status change.  Unknown cause. EXAM: CT HEAD WITHOUT CONTRAST TECHNIQUE: Contiguous axial images were obtained from the base of the skull through the vertex without intravenous contrast. COMPARISON:  CT head without contrast 01/01/2021 FINDINGS: Brain: No acute infarct, hemorrhage, or mass lesion is present. Remote lacunar infarcts are present in the internal and external capsules bilaterally. Basal ganglia are intact. No acute infarct, hemorrhage, or mass lesion is present. Mild atrophy and white matter changes are noted. The ventricles are of normal size. The brainstem and cerebellum are within normal limits. Vascular: Atherosclerotic calcifications are present within the cavernous internal carotid arteries bilaterally. No hyperdense vessel is present. Skull: Calvarium is intact. No focal lytic or blastic lesions are present. No significant extracranial soft tissue lesion is present. Sinuses/Orbits: The paranasal sinuses and mastoid air cells are clear. Bilateral lens replacements are noted. Globes and orbits are otherwise unremarkable. IMPRESSION: 1. No acute intracranial abnormality. 2. 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 3. Mild atrophy and white matter disease likely reflects the sequela of chronic microvascular ischemia. Electronically Signed   By: San Morelle M.D.   On: 14-Feb-2021 12:20   DG Chest Port 1 View  Result Date: 2021-02-14 CLINICAL DATA:  Lethargy, sepsis and end-stage renal disease. EXAM: PORTABLE CHEST 1 VIEW COMPARISON:  01/01/2021 FINDINGS: Stable heart size. Prior CABG. Tunneled dialysis catheter via the right jugular vein shows stable positioning in the distal SVC. Pacing pads present.  There is no evidence of pulmonary  edema, consolidation, pneumothorax or pleural fluid. IMPRESSION: No acute findings.  Stable positioning of dialysis catheter. Electronically Signed   By: Aletta Edouard M.D.   On: 02/20/2021 11:17   CT Angio Chest/Abd/Pel for Dissection W and/or Wo Contrast  Result Date: 02/09/2021 CLINICAL DATA:  Abdominal pain, pulsatile aortic mass, question abdominal aneurysm or dissection EXAM: CT ANGIOGRAPHY CHEST, ABDOMEN AND PELVIS TECHNIQUE: Non-contrast CT of the chest was initially obtained. Multidetector CT imaging through the chest, abdomen and pelvis was performed using the standard protocol during bolus administration of intravenous contrast. Multiplanar reconstructed images and MIPs were obtained and reviewed to evaluate the vascular anatomy. CONTRAST:  114mL OMNIPAQUE IOHEXOL 350 MG/ML SOLN IV. No oral contrast. COMPARISON:  01/01/2021 FINDINGS: CTA CHEST FINDINGS Cardiovascular: Extensive atherosclerotic calcifications aorta, proximal great vessels, coronary arteries. Aorta normal caliber. No intramural hematoma on precontrast imaging. Heart unremarkable. No pericardial effusion. Post CABG. Following contrast, thoracic aorta distends normally without aneurysm or dissection. Pulmonary arteries well opacified and patent without evidence of pulmonary embolism. Mediastinum/Nodes: Small amount of radiodense material question medication within esophagus. Esophagus otherwise unremarkable. Base of cervical region normal appearance. No thoracic adenopathy. Lungs/Pleura: Dependent atelectasis posterior lower lobes. Emphysematous changes at apices. No acute infiltrate, pleural effusion, or pneumothorax. Osseous demineralization. Prior median sternotomy. Musculoskeletal: Osseous demineralization.  Prior median sternotomy. Review of the MIP images confirms the above findings. CTA ABDOMEN AND PELVIS FINDINGS VASCULAR Aorta: Extensive atherosclerotic calcifications. Tortuous abdominal aorta. Multiple saccular aneurysms of  abdominal aorta, largest including 20 x 11 mm mid abdominal aorta. 19 x 16 mm posterior mid abdominal aorta, 23 x 13 mm posterior mid to distal abdominal aorta, and 28 x 15 posterior distal abdominal aorta. Probable saccular aneurysm RIGHT lateral distal abdominal aorta 2.0 x 2.0 cm image 215. Irregular fusiform dilatation of distal abdominal aorta 6.1 x 5.0 cm image 202 extending 6 cm length, unchanged. Penetrating ulcer posterior mid abdominal aorta at the level of the SMA. Small saccular aneurysm of retrocrural proximal abdominal aorta 16 x 10 mm to LEFT. No perianeurysmal hemorrhage. 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 Renals: High-grade narrowing of proximal renal arteries bilaterally greater than 50% narrowing. 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 LEFT common iliac artery 11 mm diameter. Calcified plaques throughout the common, internal, and external iliac arteries. Calcifications at the common femoral arteries and bifurcations. Veins: Unopacified at time of CTA imaging. Review of the MIP images confirms the above findings. NON-VASCULAR Hepatobiliary: Cholelithiasis, calculi up to 11 mm diameter. Gallbladder and liver otherwise normal appearance Pancreas: Atrophic pancreas without mass Spleen: Normal appearance Adrenals/Urinary Tract: BILATERAL adrenal adenomas, 2.4 x 1.5 cm RIGHT and 2.0 x 1.7 cm LEFT. Patchy renal and enhancement. No renal mass or hydronephrosis. Bladder and ureters unremarkable. Stomach/Bowel: Minimal sigmoid diverticulosis without evidence of diverticulitis. Mild wall thickening of descending colon question mild colitis versus artifact from underdistention. Stomach and small bowel loops unremarkable. Normal appendix. Lymphatic: No adenopathy Reproductive: Uterus and ovaries normal appearance Other: No free air  or free fluid. Scattered subcutaneous edema. No hernia. Musculoskeletal: Diffuse osseous demineralization. Review of the MIP images confirms the above findings. IMPRESSION: Stable aneurysm of distal abdominal aorta measuring 6.1 x 5.0 cm axial dimensions and extending 6.0 cm length. Multiple saccular aneurysms of the abdominal aorta as above. Penetrating ulcer posteriorly at proximal abdominal aorta. No  perianeurysmal or para-aortic infiltration/hemorrhage identified. Extensive atherosclerotic calcifications of aorta and coronary arteries. 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 Cholelithiasis. BILATERAL adrenal adenomas. Minimal sigmoid diverticulosis without diverticulitis. Emphysema (ICD10-J43.9). Aortic Atherosclerosis (ICD10-I70.0). Aortic aneurysm NOS (ICD10-I71.9). Electronically Signed   By: Lavonia Dana M.D.   On: 02/21/2021 12:37    Microbiology Recent Results (from the past 240 hour(s))  Blood Culture (routine x 2)     Status: None (Preliminary result)   Collection Time: 02/11/2021 10:20 AM   Specimen: BLOOD  Result Value Ref Range Status   Specimen Description BLOOD SITE NOT SPECIFIED  Final   Special Requests   Final    BOTTLES DRAWN AEROBIC AND ANAEROBIC Blood Culture results may not be optimal due to an inadequate volume of blood received in culture bottles   Culture   Final    NO GROWTH 2 DAYS Performed at [facility], [place] [address], [place], [place] [address]    Report Status PENDING  Incomplete  Resp Panel by RT-PCR (Flu A&B, Covid) Nasopharyngeal Swab     Status: None   Collection Time: 02/16/2021 10:43 AM   Specimen: Nasopharyngeal Swab; Nasopharyngeal(NP) swabs in vial transport medium  Result Value Ref Range Status   SARS Coronavirus 2 by RT PCR NEGATIVE NEGATIVE Final    Comment: (NOTE) SARS-CoV-2 target nucleic acids are NOT DETECTED.  The SARS-CoV-2 RNA is generally detectable in upper respiratory specimens during the  acute phase of infection. The lowest concentration of SARS-CoV-2 viral copies this assay can detect is 138 copies/mL. A negative result does not preclude SARS-Cov-2 infection and should not be used as the sole basis for treatment or other patient management decisions. A negative result may occur with  improper specimen collection/handling, submission of specimen other than nasopharyngeal swab, presence of viral mutation(s) within the areas targeted by this assay, and inadequate number of viral copies(<138 copies/mL). A negative result must be combined with clinical observations, patient history, and epidemiological information. The expected result is Negative.  Fact Sheet for Patients:  EntrepreneurPulse.com.au  Fact Sheet for Healthcare Providers:  IncredibleEmployment.be  This test is no t yet approved or cleared by the [place] FDA and  has been authorized for detection and/or diagnosis of SARS-CoV-2 by FDA under an Emergency Use Authorization (EUA). This EUA will remain  in effect (meaning this test can be used) for the duration of the COVID-19 declaration under Section 564(b)(1) of the Act, 21 U.S.C.section 360bbb-3(b)(1), unless the authorization is terminated  or revoked sooner.       Influenza A by PCR NEGATIVE NEGATIVE Final   Influenza B by PCR NEGATIVE NEGATIVE Final    Comment: (NOTE) The Xpert Xpress SARS-CoV-2/FLU/RSV plus assay is intended as an aid in the diagnosis of influenza from Nasopharyngeal swab specimens and should not be used as a sole basis for treatment. Nasal washings and aspirates are unacceptable for Xpert Xpress SARS-CoV-2/FLU/RSV testing.  Fact Sheet for Patients: EntrepreneurPulse.com.au  Fact Sheet for Healthcare Providers: IncredibleEmployment.be  This test is not yet approved or cleared by the [place] FDA and has been authorized for detection and/or  diagnosis of SARS-CoV-2 by FDA under an Emergency Use Authorization (EUA). This EUA will remain in effect (meaning this test can be used) for the duration of the COVID-19 declaration under Section 564(b)(1) of the Act, 21 U.S.C. section 360bbb-3(b)(1), unless the authorization is terminated or revoked.  Performed at [facility], [place] [address] [place], [place]  [address]   MRSA Next Gen by PCR, Nasal     Status: None   Collection Time: 02/14/2021  4:12 PM   Specimen: Nasal Mucosa; Nasal Swab  Result Value Ref Range Status   MRSA by PCR Next Gen NOT DETECTED NOT DETECTED Final    Comment: (NOTE) The GeneXpert MRSA Assay (FDA approved for NASAL specimens only), is one component of a comprehensive MRSA colonization surveillance program. It is not intended to diagnose MRSA infection nor to guide or monitor treatment for MRSA infections. Test performance is not FDA approved in patients less than 20 years old. Performed at [facility], [place] [address] [place], [place] [address]   Blood Culture (routine x 2)     Status: None (Preliminary result)   Collection Time: 02/12/2021  4:26 PM   Specimen: BLOOD RIGHT ARM  Result Value Ref Range Status   Specimen Description BLOOD RIGHT ARM  Final   Special Requests   Final    BOTTLES DRAWN AEROBIC ONLY Blood Culture results may not be optimal due to an inadequate volume of blood received in culture bottles   Culture   Final    NO GROWTH 2 DAYS Performed at [facility], [place] [address] [place], [place] [address]    Report Status PENDING  Incomplete    Lab Basic Metabolic Panel: Recent Labs  Lab 02/21/2021 1014 03/02/2021 1030 02/14/21 0523  NA 135 132* 131*  K 3.9 3.8 3.1*  CL 90* 97* 99  CO2 9*  --  16*  GLUCOSE 76 70 214*  BUN 26* 25* 26*  CREATININE 5.68* 5.50* 4.27*  CALCIUM 8.7*  --  7.4*  MG  --   --  1.3*  PHOS  --   --  3.8   Liver Function Tests: Recent Labs  Lab 02/27/2021 1014 02/14/21 0523  AST 366*  143*  ALT 128* 88*  ALKPHOS 113 89  BILITOT 0.8 0.6  PROT 4.9* 3.1*  ALBUMIN 1.5* <1.0*   No results for input(s): LIPASE, AMYLASE in the last 168 hours. No results for input(s): AMMONIA in the last 168 hours. CBC: Recent Labs  Lab 03/05/2021 1014 02/14/2021 1030 02/14/21 0523  WBC 20.2*  --  20.0*  NEUTROABS 19.6*  --   --   HGB 10.8* 12.2 8.2*  HCT 36.5 36.0 26.3*  MCV 102.0*  --  96.7  PLT 144*  --  88*   Cardiac Enzymes: No results for input(s): CKTOTAL, CKMB, CKMBINDEX, TROPONINI in the last 168 hours. Sepsis Labs: Recent Labs  Lab 03/04/2021 1014 02/22/2021 1019 03/10/2021 1215 02/12/2021 1710 02/14/21 0119 02/14/21 0523 02/14/21 1034  WBC 20.2*  --   --   --   --  20.0*  --   LATICACIDVEN  --    < > >11.0* >11.0* 8.3*  --  7.0*   < > = values in this interval not displayed.    Procedures/Operations  Right femoral central line catheter placement   Freddi Starr 25-Feb-2021, 3:26 PM

## 2021-03-11 NOTE — Progress Notes (Signed)
No apical heart tones noted; no spontaneous respirations noted; pupils fixed and dilated; findings verified with Colbert Coyer, RN; pt is NCB; Dr Erin Fulling notified; pt's daughter is at bedside; pt pronounced at [phone].

## 2021-03-11 NOTE — Progress Notes (Signed)
VASCULAR SURGERY:  We are aware that the family is now pursuing comfort measures only. Appreciate critical care medicine's help with this nice patient.  Gae Gallop, MD 6:11 AM

## 2021-03-11 DEATH — deceased

## 2021-10-15 IMAGING — CR DG CHEST 1V
1 series · 1 of 1 positions shown · non-contrast
Comparison: 07/01/2020

CLINICAL DATA: Weakness. Dialysis patient.

EXAM:
CHEST  1 VIEW

[chest ap]
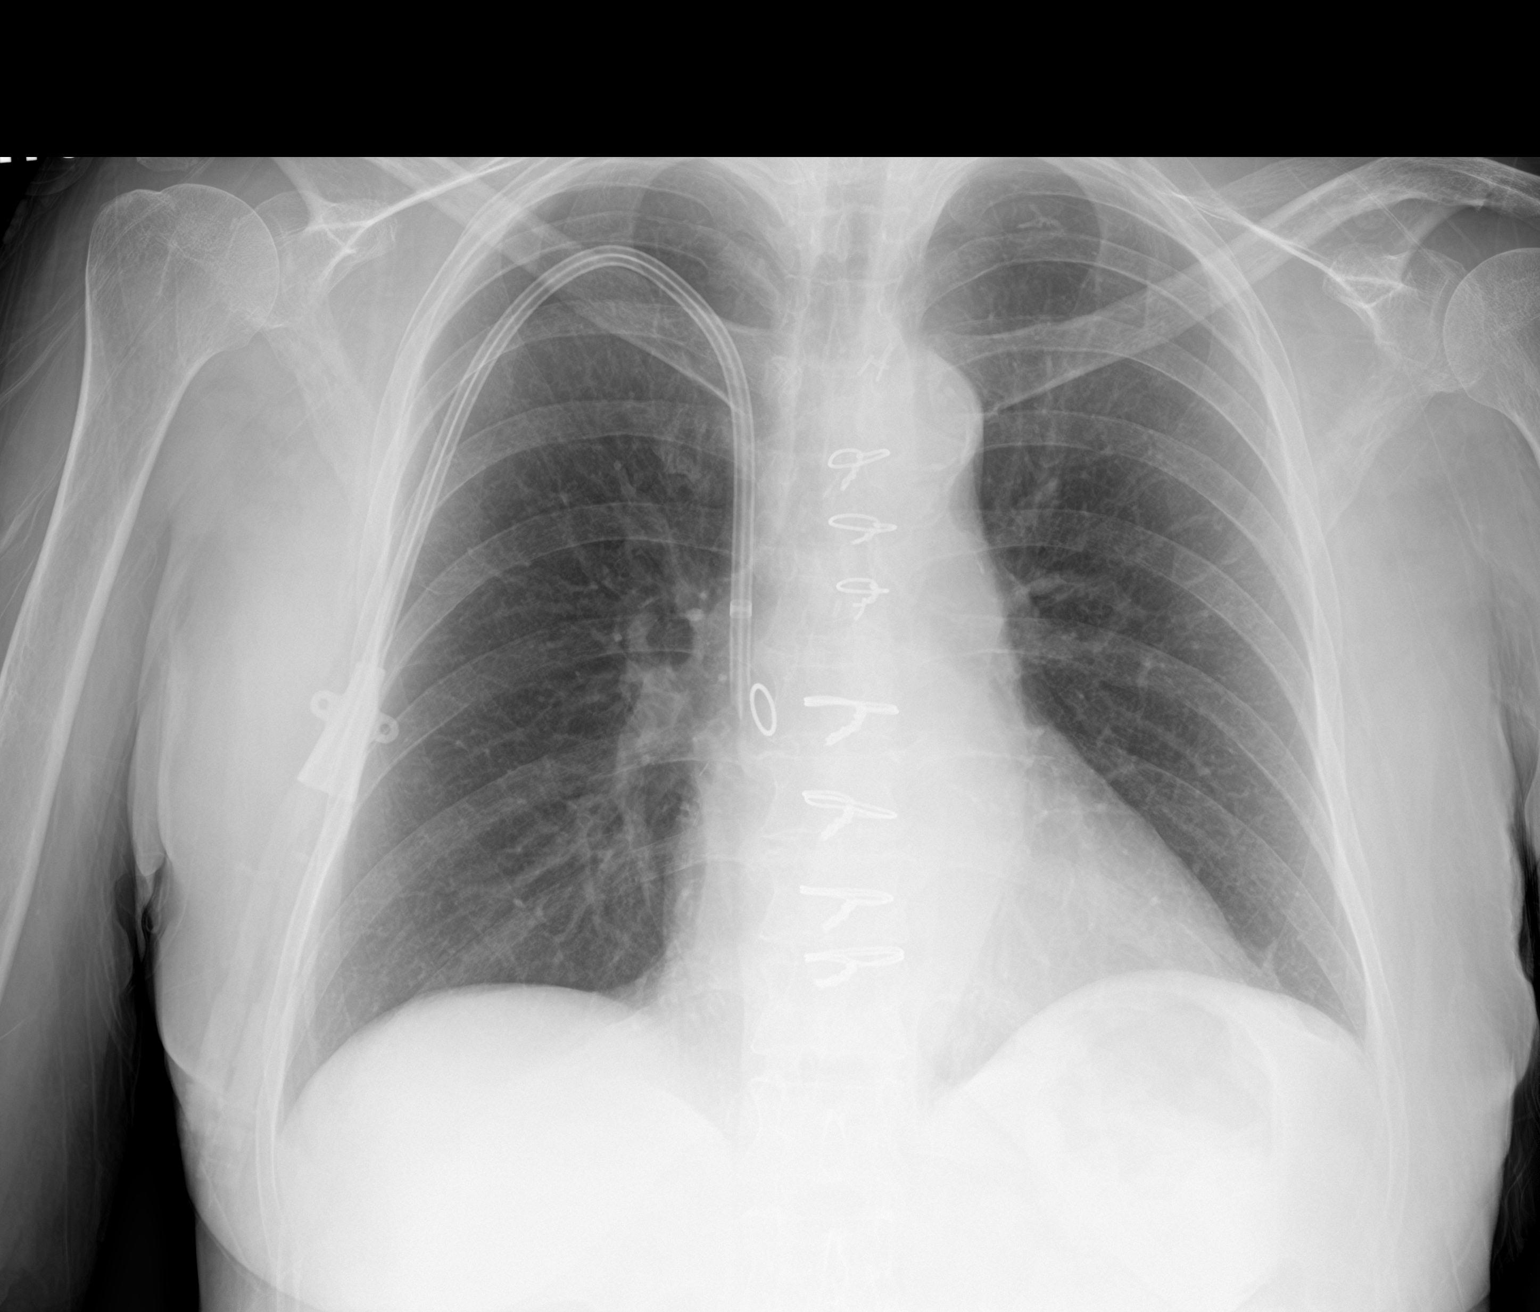

[1 of 1 positions shown; findings below may reference images not displayed]

FINDINGS: Normal sized heart. Stable post CABG changes. Interval right jugular
double-lumen catheter with the lumen tips in the superior vena cava.
Clear lungs with normal vascularity. Unremarkable bones.
IMPRESSION: No acute abnormality.

## 2022-03-01 IMAGING — DX DG CHEST 1V PORT
1 series · 1 of 1 positions shown · non-contrast
Comparison: 01/01/2021

CLINICAL DATA: Lethargy, sepsis and end-stage renal disease.

EXAM:
PORTABLE CHEST 1 VIEW

[chest]
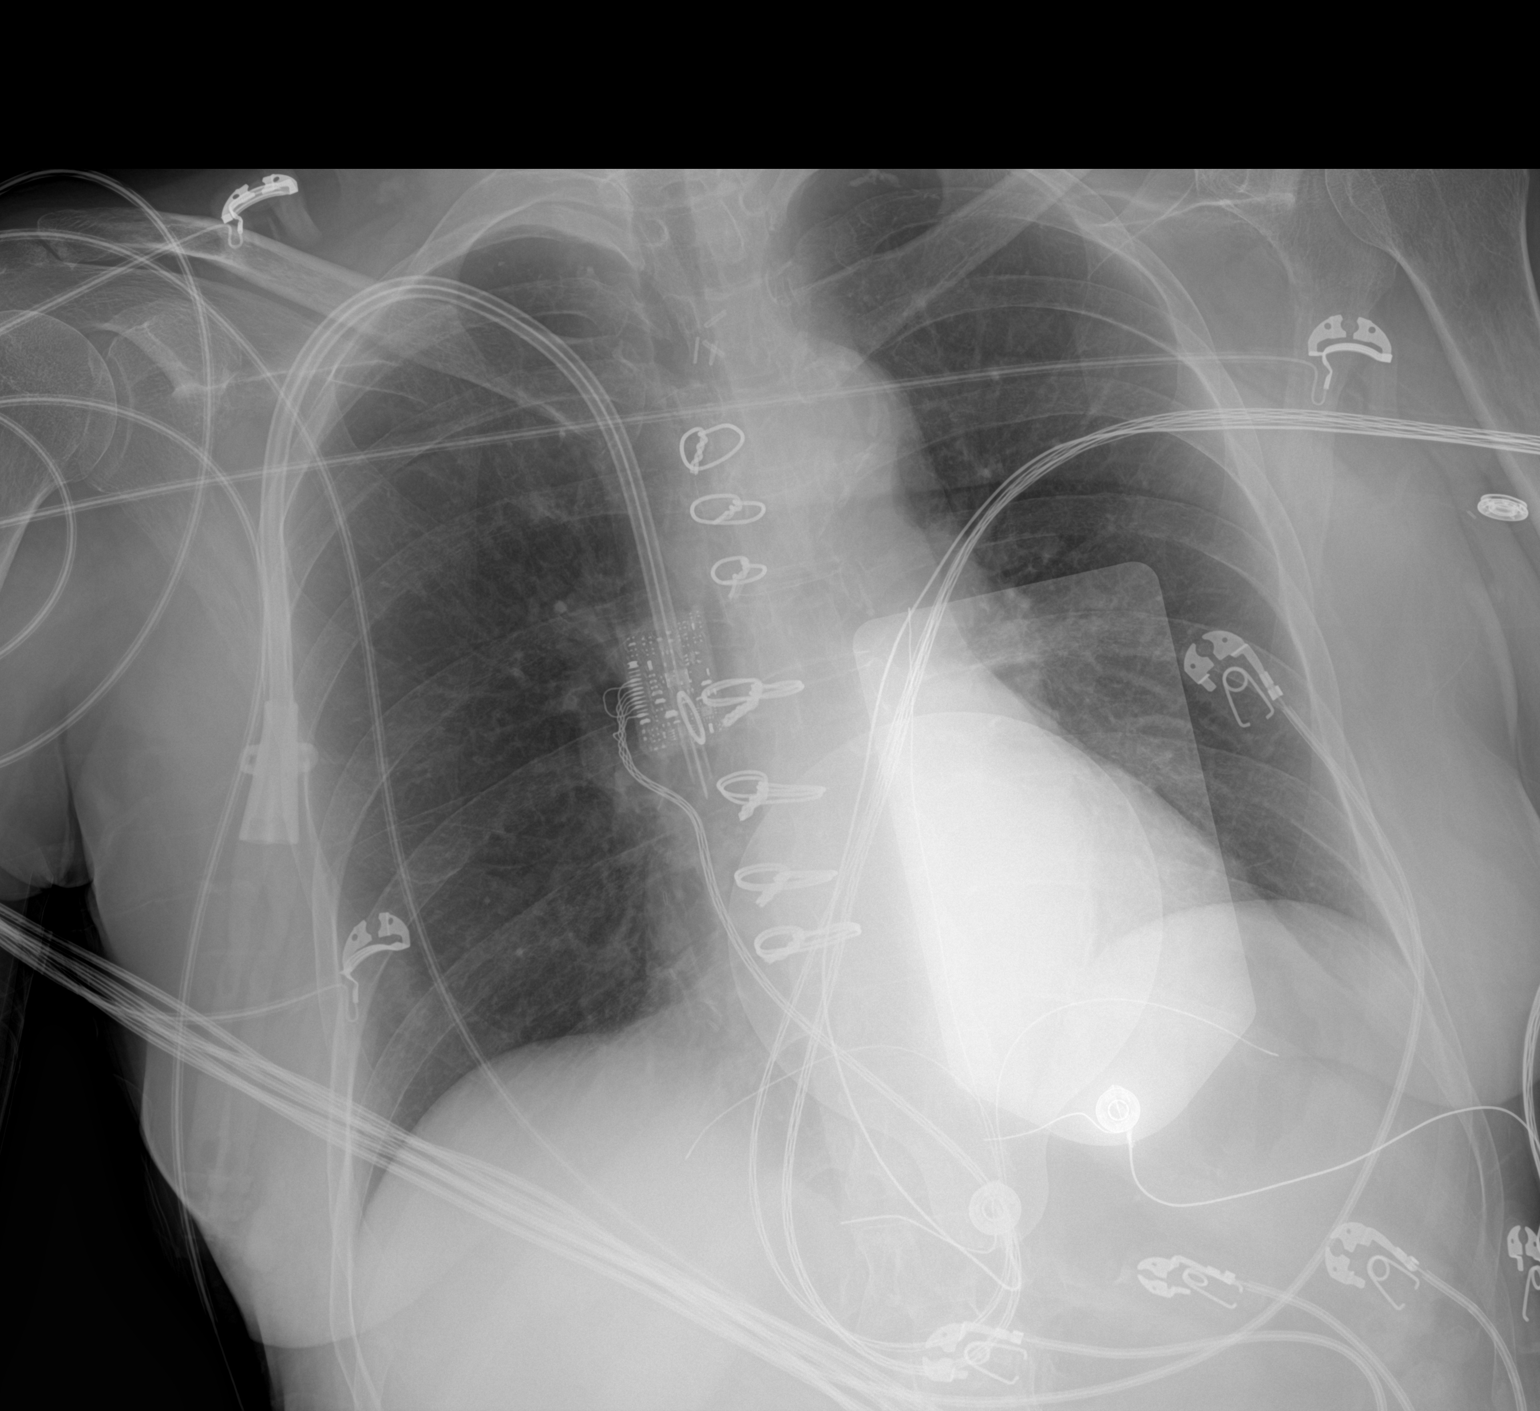

[1 of 1 positions shown; findings below may reference images not displayed]

FINDINGS: Stable heart size. Prior CABG. Tunneled dialysis catheter via the
right jugular vein shows stable positioning in the distal SVC.
Pacing pads present. There is no evidence of pulmonary edema,
consolidation, pneumothorax or pleural fluid.
IMPRESSION: No acute findings.  Stable positioning of dialysis catheter.

## 2022-03-01 IMAGING — CT CT HEAD W/O CM
4 series · 16 of 47 positions shown, 18 images · non-contrast
Comparison: CT head without contrast 01/01/2021

CLINICAL DATA: Mental status change.  Unknown cause.

EXAM:
CT HEAD WITHOUT CONTRAST
TECHNIQUE: Contiguous axial images were obtained from the base of the skull
through the vertex without intravenous contrast.

[Series 3: head bone · axial · 0.49mm/px · z∈[-99,-67]mm · 3 of 81 slices shown]
[im 9/81  bone]
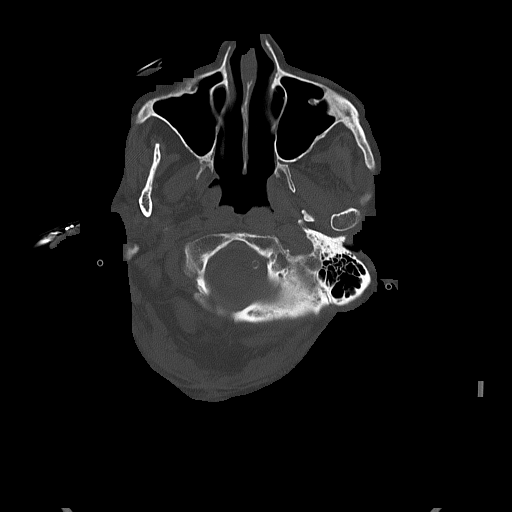
[im 17/81  bone]
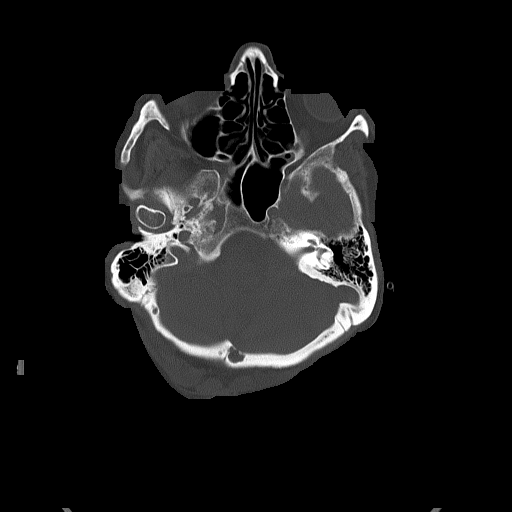
[im 25/81  bone]
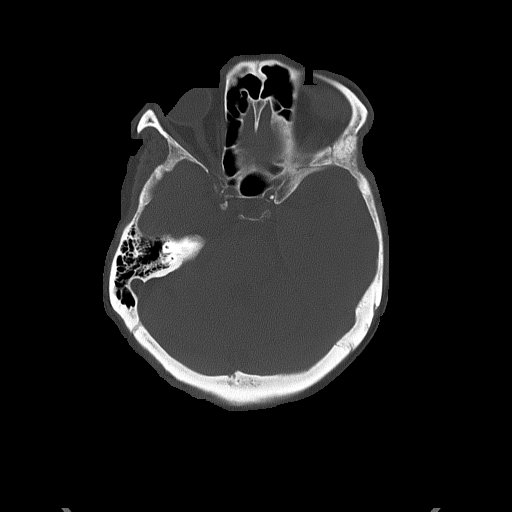

[Series 4: head without · axial · non-contrast · 0.49mm/px · z∈[-95,+25]mm · 7 of 33 slices shown, 9 images]
[im 5/33  brain]
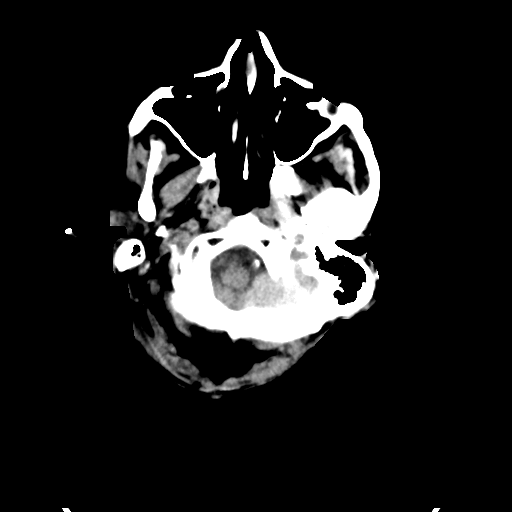
[im 5/33  bone]
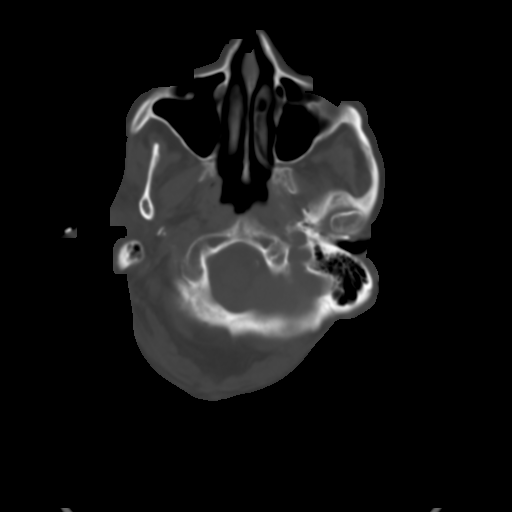
[im 9/33  brain]
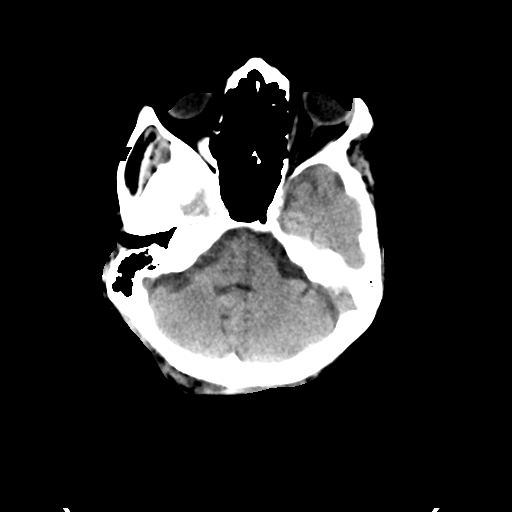
[im 13/33  brain]
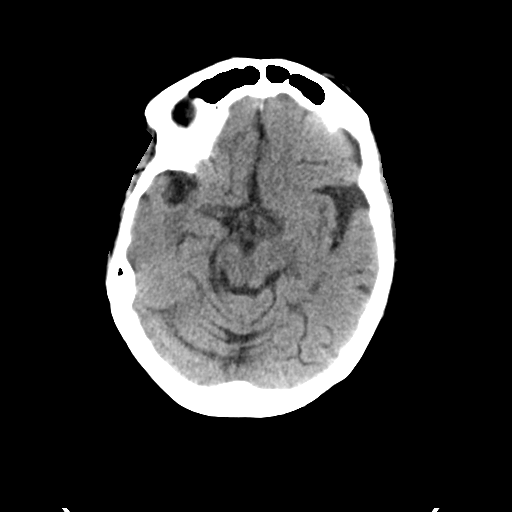
[im 17/33  brain]
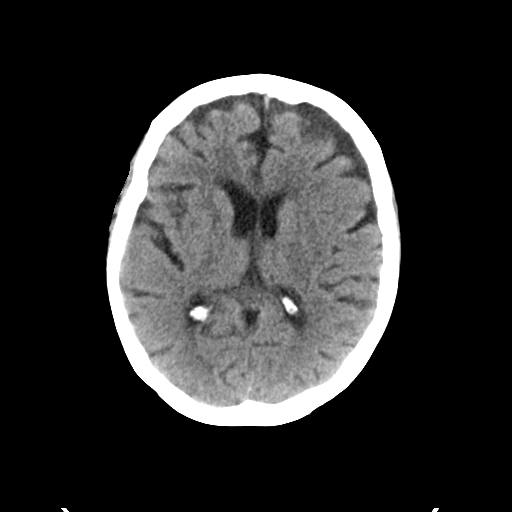
[im 21/33  brain]
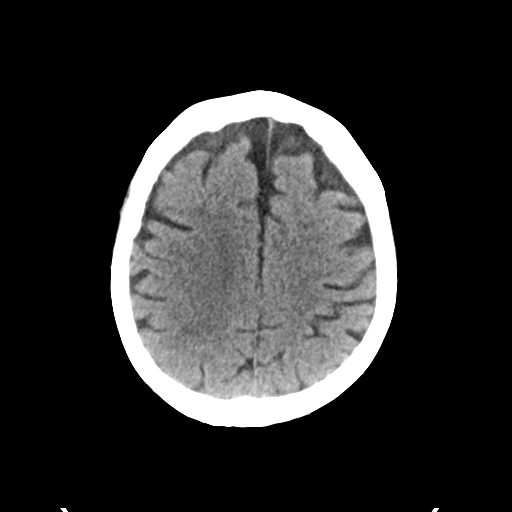
[im 21/33  bone]
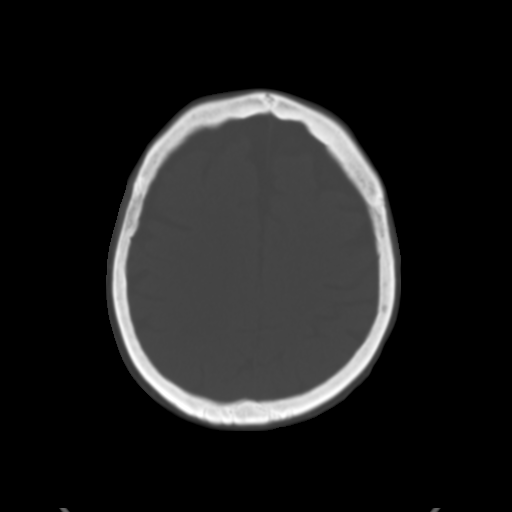
[im 25/33  brain]
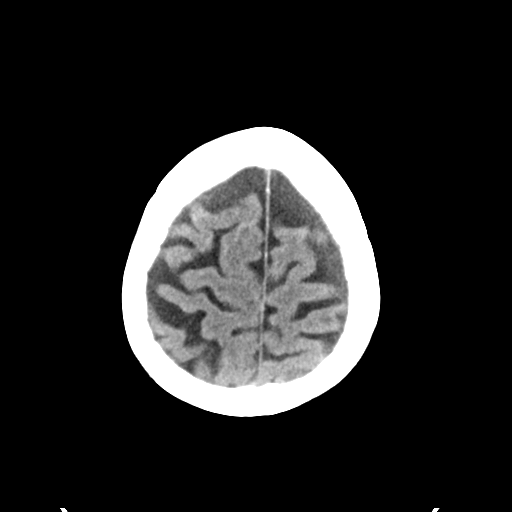
[im 29/33  brain]
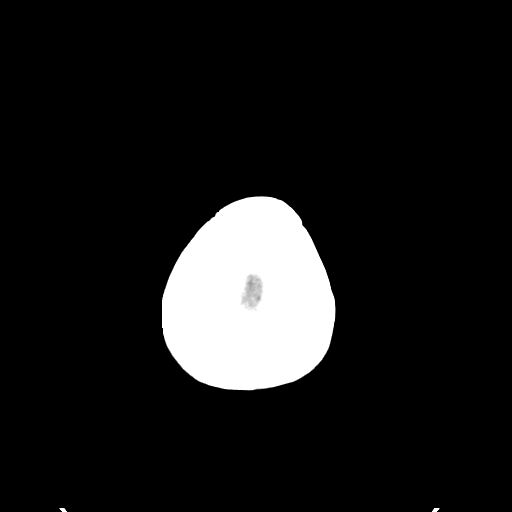

[Series 5: head without cor · coronal · non-contrast · 0.31mm/px · 3 of 62 slices shown]
[im 21/62  brain]
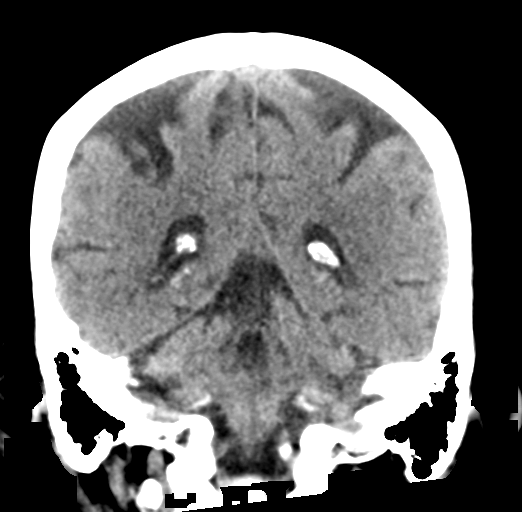
[im 28/62  brain]
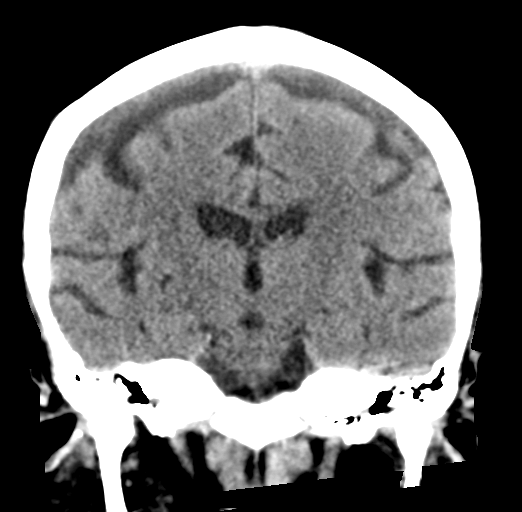
[im 34/62  brain]
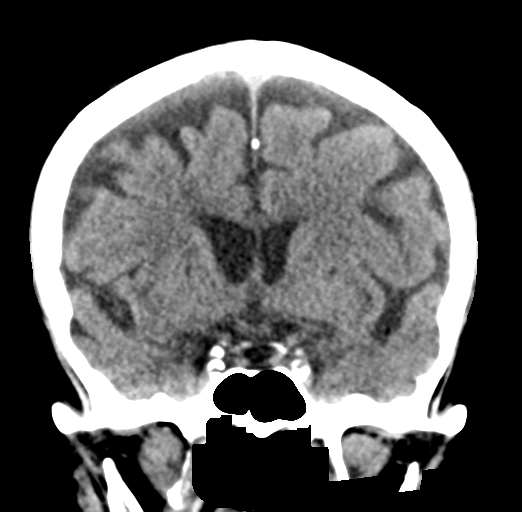

[Series 6: head without sag · sagittal · non-contrast · 0.32mm/px · 3 of 53 slices shown]
[im 19/53  brain]
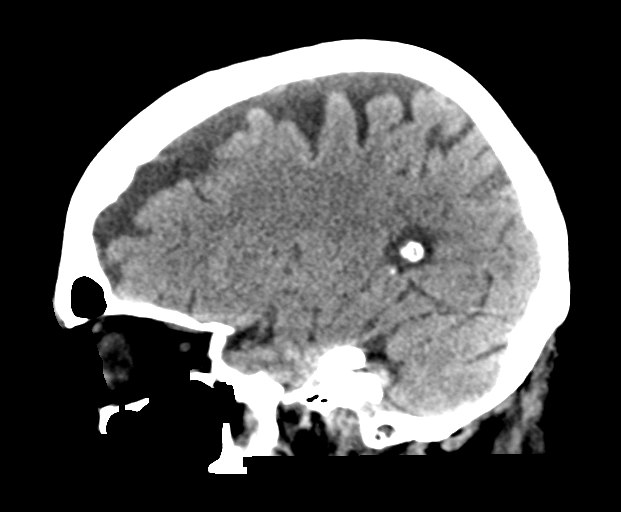
[im 27/53  brain]
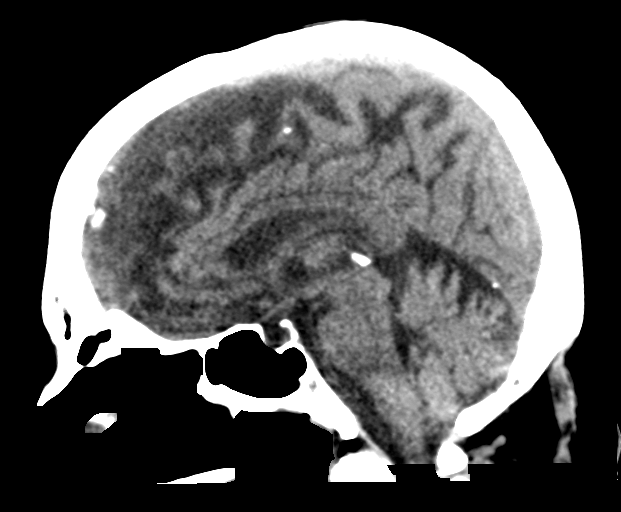
[im 35/53  brain]
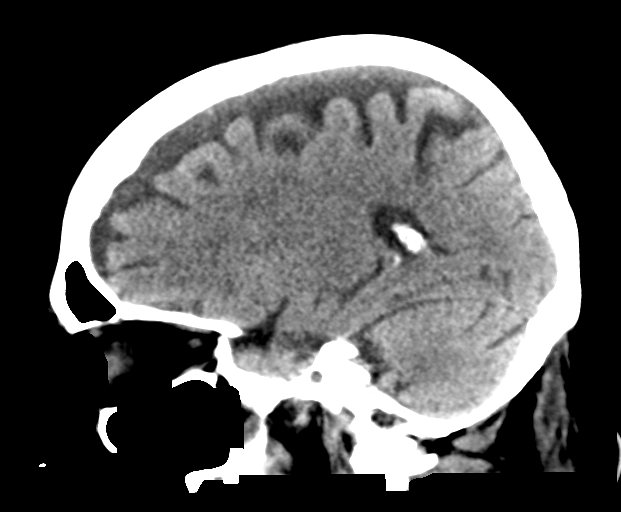

[16 of 47 positions shown; findings below may reference images not displayed]

FINDINGS: Brain: No acute infarct, hemorrhage, or mass lesion is present.
Remote lacunar infarcts are present in the internal and external
capsules bilaterally. Basal ganglia are intact. No acute infarct,
hemorrhage, or mass lesion is present. Mild atrophy and white matter
changes are noted. The ventricles are of normal size. The brainstem
and cerebellum are within normal limits.

Vascular: Atherosclerotic calcifications are present within the
cavernous internal carotid arteries bilaterally. No hyperdense
vessel is present.

Skull: Calvarium is intact. No focal lytic or blastic lesions are
present. No significant extracranial soft tissue lesion is present.

Sinuses/Orbits: The paranasal sinuses and mastoid air cells are
clear. Bilateral lens replacements are noted. Globes and orbits are
otherwise unremarkable.
IMPRESSION: 1. No acute intracranial abnormality.
2. Remote lacunar infarcts of the internal and external capsules
bilaterally.
3. Mild atrophy and white matter disease likely reflects the sequela
of chronic microvascular ischemia.
